# Patient Record
Sex: Female | Born: 1955 | Race: White | Hispanic: No | Smoking: Never smoker
Health system: Southern US, Community
[De-identification: ages and names within clinical notes are randomized; demographics above are authoritative.]

## PROBLEM LIST (undated history)

## (undated) DIAGNOSIS — F419 Anxiety disorder, unspecified: Secondary | ICD-10-CM

## (undated) DIAGNOSIS — N9489 Other specified conditions associated with female genital organs and menstrual cycle: Secondary | ICD-10-CM

## (undated) DIAGNOSIS — K6389 Other specified diseases of intestine: Secondary | ICD-10-CM

## (undated) DIAGNOSIS — E119 Type 2 diabetes mellitus without complications: Secondary | ICD-10-CM

## (undated) DIAGNOSIS — F32A Depression, unspecified: Secondary | ICD-10-CM

## (undated) DIAGNOSIS — R011 Cardiac murmur, unspecified: Secondary | ICD-10-CM

## (undated) DIAGNOSIS — I10 Essential (primary) hypertension: Secondary | ICD-10-CM

## (undated) DIAGNOSIS — N809 Endometriosis, unspecified: Secondary | ICD-10-CM

## (undated) DIAGNOSIS — R490 Dysphonia: Secondary | ICD-10-CM

## (undated) DIAGNOSIS — M549 Dorsalgia, unspecified: Secondary | ICD-10-CM

## (undated) DIAGNOSIS — F329 Major depressive disorder, single episode, unspecified: Secondary | ICD-10-CM

## (undated) DIAGNOSIS — R1903 Right lower quadrant abdominal swelling, mass and lump: Secondary | ICD-10-CM

## (undated) DIAGNOSIS — G8929 Other chronic pain: Secondary | ICD-10-CM

## (undated) DIAGNOSIS — R634 Abnormal weight loss: Secondary | ICD-10-CM

## (undated) DIAGNOSIS — K219 Gastro-esophageal reflux disease without esophagitis: Secondary | ICD-10-CM

## (undated) HISTORY — DX: Abnormal weight loss: R63.4

## (undated) HISTORY — PX: COLONOSCOPY: SHX5424

## (undated) HISTORY — DX: Other specified conditions associated with female genital organs and menstrual cycle: N94.89

## (undated) HISTORY — DX: Dysphonia: R49.0

## (undated) HISTORY — DX: Endometriosis, unspecified: N80.9

## (undated) HISTORY — DX: Right lower quadrant abdominal swelling, mass and lump: R19.03

## (undated) HISTORY — DX: Other specified diseases of intestine: K63.89

---
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
## 2006-08-14 HISTORY — PX: ABDOMINAL HYSTERECTOMY: SHX81

## 2007-02-05 ENCOUNTER — Other Ambulatory Visit: Payer: Self-pay

## 2007-02-05 ENCOUNTER — Ambulatory Visit: Payer: Self-pay | Admitting: Obstetrics and Gynecology

## 2007-02-11 ENCOUNTER — Ambulatory Visit: Payer: Self-pay | Admitting: Obstetrics and Gynecology

## 2008-09-01 ENCOUNTER — Ambulatory Visit: Payer: Self-pay | Admitting: Obstetrics and Gynecology

## 2009-07-05 ENCOUNTER — Ambulatory Visit: Payer: Self-pay | Admitting: Gastroenterology

## 2009-08-14 HISTORY — PX: ROTATOR CUFF REPAIR: SHX139

## 2010-04-06 ENCOUNTER — Ambulatory Visit: Payer: Self-pay | Admitting: Orthopedic Surgery

## 2010-04-08 ENCOUNTER — Ambulatory Visit: Payer: Self-pay | Admitting: Cardiovascular Disease

## 2010-04-14 ENCOUNTER — Ambulatory Visit: Payer: Self-pay | Admitting: Orthopedic Surgery

## 2011-07-07 ENCOUNTER — Emergency Department: Payer: Self-pay | Admitting: Emergency Medicine

## 2011-08-04 ENCOUNTER — Ambulatory Visit: Payer: Self-pay | Admitting: Orthopedic Surgery

## 2013-09-12 ENCOUNTER — Emergency Department: Payer: Self-pay | Admitting: Emergency Medicine

## 2014-11-26 DIAGNOSIS — E8940 Asymptomatic postprocedural ovarian failure: Secondary | ICD-10-CM | POA: Insufficient documentation

## 2015-02-09 DIAGNOSIS — E119 Type 2 diabetes mellitus without complications: Secondary | ICD-10-CM | POA: Insufficient documentation

## 2016-02-14 ENCOUNTER — Encounter: Payer: Self-pay | Admitting: Emergency Medicine

## 2016-02-14 ENCOUNTER — Emergency Department
Admission: EM | Admit: 2016-02-14 | Discharge: 2016-02-14 | Disposition: A | Discharge status: Home / Self Care | Payer: BC Managed Care – PPO | Attending: Emergency Medicine | Admitting: Emergency Medicine

## 2016-02-14 ENCOUNTER — Emergency Department: Payer: BC Managed Care – PPO

## 2016-02-14 DIAGNOSIS — I10 Essential (primary) hypertension: Secondary | ICD-10-CM | POA: Diagnosis not present

## 2016-02-14 DIAGNOSIS — R1030 Lower abdominal pain, unspecified: Secondary | ICD-10-CM

## 2016-02-14 DIAGNOSIS — N390 Urinary tract infection, site not specified: Secondary | ICD-10-CM

## 2016-02-14 DIAGNOSIS — E119 Type 2 diabetes mellitus without complications: Secondary | ICD-10-CM | POA: Diagnosis not present

## 2016-02-14 DIAGNOSIS — F329 Major depressive disorder, single episode, unspecified: Secondary | ICD-10-CM | POA: Diagnosis not present

## 2016-02-14 HISTORY — DX: Type 2 diabetes mellitus without complications: E11.9

## 2016-02-14 HISTORY — DX: Essential (primary) hypertension: I10

## 2016-02-14 HISTORY — DX: Depression, unspecified: F32.A

## 2016-02-14 HISTORY — DX: Anxiety disorder, unspecified: F41.9

## 2016-02-14 HISTORY — DX: Major depressive disorder, single episode, unspecified: F32.9

## 2016-02-14 LAB — COMPREHENSIVE METABOLIC PANEL
ALK PHOS: 59 U/L (ref 38–126)
ALT: 16 U/L (ref 14–54)
ANION GAP: 9 (ref 5–15)
AST: 15 U/L (ref 15–41)
Albumin: 4.2 g/dL (ref 3.5–5.0)
BILIRUBIN TOTAL: 0.2 mg/dL — AB (ref 0.3–1.2)
BUN: 20 mg/dL (ref 6–20)
CALCIUM: 9.5 mg/dL (ref 8.9–10.3)
CO2: 26 mmol/L (ref 22–32)
Chloride: 101 mmol/L (ref 101–111)
Creatinine, Ser: 0.76 mg/dL (ref 0.44–1.00)
Glucose, Bld: 77 mg/dL (ref 65–99)
POTASSIUM: 4 mmol/L (ref 3.5–5.1)
Sodium: 136 mmol/L (ref 135–145)
TOTAL PROTEIN: 7.3 g/dL (ref 6.5–8.1)

## 2016-02-14 LAB — URINALYSIS COMPLETE WITH MICROSCOPIC (ARMC ONLY)
BILIRUBIN URINE: NEGATIVE
Glucose, UA: NEGATIVE mg/dL
Hgb urine dipstick: NEGATIVE
KETONES UR: NEGATIVE mg/dL
LEUKOCYTES UA: NEGATIVE
NITRITE: NEGATIVE
PH: 5 (ref 5.0–8.0)
PROTEIN: NEGATIVE mg/dL
RBC / HPF: NONE SEEN RBC/hpf (ref 0–5)
SPECIFIC GRAVITY, URINE: 1.012 (ref 1.005–1.030)

## 2016-02-14 LAB — CBC
HEMATOCRIT: 38.1 % (ref 35.0–47.0)
HEMOGLOBIN: 13.2 g/dL (ref 12.0–16.0)
MCH: 32.3 pg (ref 26.0–34.0)
MCHC: 34.7 g/dL (ref 32.0–36.0)
MCV: 93.1 fL (ref 80.0–100.0)
Platelets: 200 10*3/uL (ref 150–440)
RBC: 4.09 MIL/uL (ref 3.80–5.20)
RDW: 12.5 % (ref 11.5–14.5)
WBC: 5.2 10*3/uL (ref 3.6–11.0)

## 2016-02-14 LAB — LIPASE, BLOOD: Lipase: 17 U/L (ref 11–51)

## 2016-02-14 MED ORDER — IOPAMIDOL (ISOVUE-300) INJECTION 61%
100.0000 mL | Freq: Once | INTRAVENOUS | Status: AC | PRN
  Administered 2016-02-14: 100 mL via INTRAVENOUS
  Filled 2016-02-14: qty 100

## 2016-02-14 MED ORDER — CIPROFLOXACIN HCL 500 MG PO TABS: 500.0000 mg | ORAL_TABLET | Freq: Two times a day (BID) | ORAL | Status: DC

## 2016-02-14 MED ORDER — CIPROFLOXACIN HCL 500 MG PO TABS
500.0000 mg | ORAL_TABLET | Freq: Once | ORAL | Status: AC
  Administered 2016-02-14: 500 mg via ORAL

## 2016-02-14 MED ORDER — DIATRIZOATE MEGLUMINE & SODIUM 66-10 % PO SOLN: 15.0000 mL | Freq: Once | ORAL | Status: DC

## 2016-02-14 MED ORDER — CIPROFLOXACIN HCL 500 MG PO TABS
ORAL_TABLET | ORAL | Status: AC
  Administered 2016-02-14: 500 mg via ORAL
  Filled 2016-02-14: qty 1

## 2016-02-14 NOTE — ED Notes (Signed)
Pt to ed from [facility] with c/o abd pain x several days.  Pt states pain was worse after eating until today and now it is constant.  Pt reports pain in lower central abd.  Denies n/v/d.

## 2016-02-14 NOTE — ED Provider Notes (Signed)
[facility] Emergency Department Provider Note  ____________________________________________    I have reviewed the triage vital signs and the nursing notes.   HISTORY  Chief Complaint Abdominal Pain    HPI Haley Jones is a 60 y.o. female who presents with complaints of abdominal pain for approximately 2-3 days. She reports the pain is primarily in the lower center of her abdomen and is burning in nature. She denies dysuria. No fevers or chills. No nausea. No diarrhea. She has had similar symptoms in the past with urinary tract infection.She has had a C-section in the past but no other surgeries     Past Medical History  Diagnosis Date  . Diabetes mellitus without complication ([place])   . Depression   . Anxiety     There are no active problems to display for this patient.   History reviewed. No pertinent past surgical history.  No current outpatient prescriptions on file.  Allergies Penicillins  No family history on file.  Social History Social History  Substance Use Topics  . Smoking status: Never Smoker   . Smokeless tobacco: None  . Alcohol Use: No    Review of Systems  Constitutional: Negative for fever.  ENT: Negative for sore throat Cardiovascular: Negative for chest pain Respiratory: Negative for Cough Gastrointestinal: As above Genitourinary: Negative for dysuria. No frequency, no hematuria Musculoskeletal: Negative for back pain. Skin: Negative for rash. Neurological: Negative for headache Psychiatric: no anxiety    ____________________________________________   PHYSICAL EXAM:  VITAL SIGNS: ED Triage Vitals  Enc Vitals Group     BP 02/14/16 1753 130/81 mmHg     Pulse Rate 02/14/16 1753 66     Resp 02/14/16 1753 18     Temp 02/14/16 1753 98.2 F (36.8 C)     Temp Source 02/14/16 1753 Oral     SpO2 02/14/16 1753 100 %     Weight 02/14/16 1753 178 lb (80.74 kg)     Height 02/14/16 1753 4\' 11"  (1.499 m)      Head Cir --      Peak Flow --      Pain Score 02/14/16 1739 10     Pain Loc --      Pain Edu? --      Excl. in [place]? --      Constitutional: Alert and oriented. Well appearing and in no distress. Pleasant and interactive Eyes: Conjunctivae are normal. No erythema or injection ENT   Head: Normocephalic and atraumatic.   Mouth/Throat: Mucous membranes are moist. Cardiovascular: Normal rate, regular rhythm. Normal and symmetric distal pulses are present in the upper extremities.  Respiratory: Normal respiratory effort without tachypnea nor retractions. Breath sounds are clear and equal bilaterally.  Gastrointestinal: Very mild suprapubic tenderness to palpation. No distention. There is no CVA tenderness. Genitourinary: deferred Musculoskeletal: Nontender with normal range of motion in all extremities. No lower extremity tenderness nor edema. Neurologic:  Normal speech and language. No gross focal neurologic deficits are appreciated. Skin:  Skin is warm, dry and intact. No rash noted. Psychiatric: Mood and affect are normal. Patient exhibits appropriate insight and judgment.  ____________________________________________    LABS (pertinent positives/negatives)  Labs Reviewed  CBC  LIPASE, BLOOD  COMPREHENSIVE METABOLIC PANEL  URINALYSIS COMPLETEWITH MICROSCOPIC ([facility] ONLY)    ____________________________________________   EKG  None ____________________________________________    RADIOLOGY  CT scan unremarkable  ____________________________________________   PROCEDURES  Procedure(s) performed: none  Critical Care performed: none  ____________________________________________   INITIAL IMPRESSION /  ASSESSMENT AND PLAN / ED COURSE  Pertinent labs & imaging results that were available during my care of the patient were reviewed by me and considered in my medical decision making (see chart for details).  Patient well-appearing and in no distress. She  has mild burning sensation suprapubically, we will check urine, labs and reevaluate  ----------------------------------------- 8:27 PM on 02/14/2016 -----------------------------------------  CT scan unremarkable. Labs benign. Urinalysis normal but patient's symptoms are similar to urinary tract infections in the past. Will send urine culture and start the patient on antibiotics.. Return precautions discussed and patient remains well appearing and in no distress. Vitals normal.  ____________________________________________   FINAL CLINICAL IMPRESSION(S) / ED DIAGNOSES  Final diagnoses:  UTI (lower urinary tract infection)  Lower abdominal pain          Lavonia Drafts, MD 02/14/16 2027

## 2016-02-14 NOTE — Discharge Instructions (Signed)

## 2016-02-16 LAB — URINE CULTURE

## 2017-03-29 ENCOUNTER — Other Ambulatory Visit: Payer: Self-pay | Admitting: Obstetrics and Gynecology

## 2017-03-29 DIAGNOSIS — Z1231 Encounter for screening mammogram for malignant neoplasm of breast: Secondary | ICD-10-CM

## 2017-04-17 ENCOUNTER — Ambulatory Visit
Admission: RE | Admit: 2017-04-17 | Discharge: 2017-04-17 | Disposition: A | Discharge status: Home / Self Care | Payer: BC Managed Care – PPO | Source: Ambulatory Visit | Attending: Obstetrics and Gynecology | Admitting: Obstetrics and Gynecology

## 2017-04-17 DIAGNOSIS — Z1231 Encounter for screening mammogram for malignant neoplasm of breast: Secondary | ICD-10-CM | POA: Diagnosis present

## 2017-07-19 ENCOUNTER — Other Ambulatory Visit: Payer: Self-pay | Admitting: Internal Medicine

## 2017-07-19 DIAGNOSIS — M5116 Intervertebral disc disorders with radiculopathy, lumbar region: Secondary | ICD-10-CM

## 2017-07-26 ENCOUNTER — Ambulatory Visit
Admission: RE | Admit: 2017-07-26 | Discharge: 2017-07-26 | Disposition: A | Discharge status: Home / Self Care | Payer: BC Managed Care – PPO | Source: Ambulatory Visit | Attending: Internal Medicine | Admitting: Internal Medicine

## 2017-07-26 DIAGNOSIS — M5116 Intervertebral disc disorders with radiculopathy, lumbar region: Secondary | ICD-10-CM | POA: Diagnosis present

## 2017-07-26 DIAGNOSIS — M5136 Other intervertebral disc degeneration, lumbar region: Secondary | ICD-10-CM | POA: Diagnosis not present

## 2017-07-26 DIAGNOSIS — M48061 Spinal stenosis, lumbar region without neurogenic claudication: Secondary | ICD-10-CM | POA: Diagnosis not present

## 2017-07-26 DIAGNOSIS — M519 Unspecified thoracic, thoracolumbar and lumbosacral intervertebral disc disorder: Secondary | ICD-10-CM | POA: Insufficient documentation

## 2017-08-15 ENCOUNTER — Emergency Department: Payer: BC Managed Care – PPO

## 2017-08-15 ENCOUNTER — Emergency Department
Admission: EM | Admit: 2017-08-15 | Discharge: 2017-08-15 | Disposition: A | Discharge status: Home / Self Care | Payer: BC Managed Care – PPO | Attending: Emergency Medicine | Admitting: Emergency Medicine

## 2017-08-15 ENCOUNTER — Encounter: Payer: Self-pay | Admitting: Emergency Medicine

## 2017-08-15 DIAGNOSIS — R1031 Right lower quadrant pain: Secondary | ICD-10-CM | POA: Diagnosis present

## 2017-08-15 DIAGNOSIS — K5900 Constipation, unspecified: Secondary | ICD-10-CM

## 2017-08-15 DIAGNOSIS — E119 Type 2 diabetes mellitus without complications: Secondary | ICD-10-CM | POA: Insufficient documentation

## 2017-08-15 DIAGNOSIS — I10 Essential (primary) hypertension: Secondary | ICD-10-CM | POA: Insufficient documentation

## 2017-08-15 LAB — COMPREHENSIVE METABOLIC PANEL
ALK PHOS: 59 U/L (ref 38–126)
ALT: 26 U/L (ref 14–54)
ANION GAP: 11 (ref 5–15)
AST: 22 U/L (ref 15–41)
Albumin: 3.8 g/dL (ref 3.5–5.0)
BILIRUBIN TOTAL: 0.6 mg/dL (ref 0.3–1.2)
BUN: 24 mg/dL — ABNORMAL HIGH (ref 6–20)
CALCIUM: 9.3 mg/dL (ref 8.9–10.3)
CO2: 24 mmol/L (ref 22–32)
CREATININE: 1.36 mg/dL — AB (ref 0.44–1.00)
Chloride: 101 mmol/L (ref 101–111)
GFR calc non Af Amer: 42 mL/min — ABNORMAL LOW (ref >60)
GFR, EST AFRICAN AMERICAN: 48 mL/min — AB (ref >60)
Glucose, Bld: 111 mg/dL — ABNORMAL HIGH (ref 65–99)
Potassium: 4.3 mmol/L (ref 3.5–5.1)
SODIUM: 136 mmol/L (ref 135–145)
TOTAL PROTEIN: 6.8 g/dL (ref 6.5–8.1)

## 2017-08-15 LAB — CBC
HCT: 36.6 % (ref 35.0–47.0)
HEMOGLOBIN: 12.5 g/dL (ref 12.0–16.0)
MCH: 32.1 pg (ref 26.0–34.0)
MCHC: 34.1 g/dL (ref 32.0–36.0)
MCV: 94 fL (ref 80.0–100.0)
PLATELETS: 225 10*3/uL (ref 150–440)
RBC: 3.89 MIL/uL (ref 3.80–5.20)
RDW: 13.4 % (ref 11.5–14.5)
WBC: 9.6 10*3/uL (ref 3.6–11.0)

## 2017-08-15 LAB — URINALYSIS, COMPLETE (UACMP) WITH MICROSCOPIC
Bilirubin Urine: NEGATIVE
GLUCOSE, UA: NEGATIVE mg/dL
Hgb urine dipstick: NEGATIVE
Ketones, ur: NEGATIVE mg/dL
Leukocytes, UA: NEGATIVE
Nitrite: NEGATIVE
PH: 5 (ref 5.0–8.0)
Protein, ur: 30 mg/dL — AB
SPECIFIC GRAVITY, URINE: 1.02 (ref 1.005–1.030)

## 2017-08-15 LAB — LIPASE, BLOOD: Lipase: 21 U/L (ref 11–51)

## 2017-08-15 MED ORDER — SODIUM CHLORIDE 0.9 % IV BOLUS (SEPSIS)
1000.0000 mL | Freq: Once | INTRAVENOUS | Status: AC
  Administered 2017-08-15: 1000 mL via INTRAVENOUS

## 2017-08-15 MED ORDER — IOPAMIDOL (ISOVUE-300) INJECTION 61%
75.0000 mL | Freq: Once | INTRAVENOUS | Status: AC | PRN
  Administered 2017-08-15: 75 mL via INTRAVENOUS
  Filled 2017-08-15: qty 75

## 2017-08-15 MED ORDER — ONDANSETRON HCL 4 MG/2ML IJ SOLN
4.0000 mg | Freq: Once | INTRAMUSCULAR | Status: AC
  Administered 2017-08-15: 4 mg via INTRAVENOUS
  Filled 2017-08-15: qty 2

## 2017-08-15 MED ORDER — IOPAMIDOL (ISOVUE-300) INJECTION 61%
30.0000 mL | Freq: Once | INTRAVENOUS | Status: AC | PRN
  Administered 2017-08-15: 30 mL via ORAL
  Filled 2017-08-15: qty 30

## 2017-08-15 MED ORDER — POLYETHYLENE GLYCOL 3350 17 GM/SCOOP PO POWD: 1.0000 | Freq: Once | ORAL | 0 refills | Status: AC

## 2017-08-15 MED ORDER — MORPHINE SULFATE (PF) 4 MG/ML IV SOLN
4.0000 mg | Freq: Once | INTRAVENOUS | Status: AC
  Administered 2017-08-15: 4 mg via INTRAVENOUS
  Filled 2017-08-15: qty 1

## 2017-08-15 MED ORDER — DOCUSATE SODIUM 100 MG PO CAPS: 100.0000 mg | ORAL_CAPSULE | Freq: Every day | ORAL | 2 refills | Status: AC | PRN

## 2017-08-15 NOTE — ED Provider Notes (Signed)
[facility] Emergency Department Provider Note  ____________________________________________   I have reviewed the triage vital signs and the nursing notes. Where available I have reviewed prior notes and, if possible and indicated, outside hospital notes.    HISTORY  Chief Complaint Abdominal Pain    HPI Haley Jones is a 62 y.o. female  With abdominal painjust finished abx for uti.  Has no hx of kidney stones. No hx of appy. Had total hyst in the past.     Location: rlq Radiation:  none Quality:  sharp crampy and intermittent Duration:  3 days Timing:  Gradual onset, nothing makes better or worse that she can think of.  Severity: at times significant Associated sxs:  No fever no diarrhea positive constipation PriorTreatment :  Tried nothing for pain.    Past Medical History:  Diagnosis Date  . Anxiety   . Depression   . Diabetes mellitus without complication ([place])   . Hypertension     There are no active problems to display for this patient.   Past Surgical History:  Procedure Laterality Date  . ABDOMINAL HYSTERECTOMY    . CESAREAN SECTION    . EYE SURGERY      Prior to Admission medications   Medication Sig Start Date End Date Taking? Authorizing Provider  ciprofloxacin (CIPRO) 500 MG tablet Take 1 tablet (500 mg total) by mouth 2 (two) times daily. 02/14/16   Lavonia Drafts, MD    Allergies Penicillins  No family history on file.  Social History Social History   Tobacco Use  . Smoking status: Never Smoker  . Smokeless tobacco: Never Used  Substance Use Topics  . Alcohol use: No  . Drug use: No    Review of Systems Constitutional: No fever/chills Eyes: No visual changes. ENT: No sore throat. No stiff neck no neck pain Cardiovascular: Denies chest pain. Respiratory: Denies shortness of breath. Gastrointestinal:   no vomiting.  No diarrhea.  + constipation. Genitourinary: Negative for dysuria. Musculoskeletal:  Negative lower extremity swelling Skin: Negative for rash. Neurological: Negative for severe headaches, focal weakness or numbness.   ____________________________________________   PHYSICAL EXAM:  VITAL SIGNS: ED Triage Vitals  Enc Vitals Group     BP 08/15/17 1821 (!) 142/85     Pulse Rate 08/15/17 1821 (!) 104     Resp 08/15/17 1821 17     Temp 08/15/17 1821 98 F (36.7 C)     Temp Source 08/15/17 1821 Oral     SpO2 08/15/17 1821 100 %     Weight 08/15/17 1820 148 lb (67.1 kg)     Height 08/15/17 1820 4\' 10"  (1.473 m)     Head Circumference --      Peak Flow --      Pain Score 08/15/17 1820 10     Pain Loc --      Pain Edu? --      Excl. in [place]? --     Constitutional: Alert and oriented. Well appearing and in no acute distress. Eyes: Conjunctivae are normal Head: Atraumatic HEENT: No congestion/rhinnorhea. Mucous membranes are moist.  Oropharynx non-erythematous Neck:   Nontender with no meningismus, no masses, no stridor Cardiovascular: Normal rate, regular rhythm. Grossly normal heart sounds.  Good peripheral circulation. Respiratory: Normal respiratory effort.  No retractions. Lungs CTAB. Abdominal: Soft and right-sided mid lower abdominal tenderness.  No pain over the gallbladder.  Mostly in the lower abdomen.  No guarding no rebound.  Soft abdomen. Back:  There  is no focal tenderness or step off.  there is no midline tenderness there are no lesions noted. there is no CVA tenderness Musculoskeletal: No lower extremity tenderness, no upper extremity tenderness. No joint effusions, no DVT signs strong distal pulses no edema Neurologic:  Normal speech and language. No gross focal neurologic deficits are appreciated.  Skin:  Skin is warm, dry and intact. No rash noted. Psychiatric: Mood and affect are normal. Speech and behavior are normal.  ____________________________________________   LABS (all labs ordered are listed, but only abnormal results are  displayed)  Labs Reviewed  COMPREHENSIVE METABOLIC PANEL - Abnormal; Notable for the following components:      Result Value   Glucose, Bld 111 (*)    BUN 24 (*)    Creatinine, Ser 1.36 (*)    GFR calc non Af Amer 42 (*)    GFR calc Af Amer 48 (*)    All other components within normal limits  URINALYSIS, COMPLETE (UACMP) WITH MICROSCOPIC - Abnormal; Notable for the following components:   Color, Urine YELLOW (*)    APPearance CLOUDY (*)    Protein, ur 30 (*)    Bacteria, UA RARE (*)    Squamous Epithelial / LPF 6-30 (*)    All other components within normal limits  LIPASE, BLOOD  CBC    Pertinent labs  results that were available during my care of the patient were reviewed by me and considered in my medical decision making (see chart for details). ____________________________________________  EKG  I personally interpreted any EKGs ordered by me or triage  ____________________________________________  RADIOLOGY  Pertinent labs & imaging results that were available during my care of the patient were reviewed by me and considered in my medical decision making (see chart for details). If possible, patient and/or family made aware of any abnormal findings.  No results found. ____________________________________________    PROCEDURES  Procedure(s) performed: None  Procedures  Critical Care performed: None  ____________________________________________   INITIAL IMPRESSION / ASSESSMENT AND PLAN / ED COURSE  Pertinent labs & imaging results that were available during my care of the patient were reviewed by me and considered in my medical decision making (see chart for details).  With 3 days of abdominal pain yet normal white count no fever, no vomiting, and reassuring exam nonetheless given her age I did do a CT scan there is no evidence of appendicitis she does have what appears to be a significant stool burden on the right which I think is causing her cramping  intermittent pain.  Patient made aware of all findings including lumbar disc disease but she is not complaining of any neurologic symptoms and I do not think this is a radiculopathy.  We will have patient closely follow-up with PCP for that.  She has she states already been made aware of it he had an MRI for the same in December of last year, 2 weeks ago considering the patient's symptoms, medical history, and physical examination today, I have low suspicion for cholecystitis or biliary pathology, pancreatitis, perforation or bowel obstruction, hernia, intra-abdominal abscess, AAA or dissection, volvulus or intussusception, mesenteric ischemia, ischemic gut, pyelonephritis or appendicitis.  Nothing to suggest intrathoracic pathology such as ACS or PE or dissection, we will treat her with a fairly aggressive bowel regimen at home to see if we can get her feeling better    ____________________________________________   FINAL CLINICAL IMPRESSION(S) / ED DIAGNOSES  Final diagnoses:  None      This  chart was dictated using voice recognition software.  Despite best efforts to proofread,  errors can occur which can change meaning.      Schuyler Amor, MD 08/15/17 2156

## 2017-08-15 NOTE — ED Notes (Signed)
Patient transported to CT 

## 2017-08-15 NOTE — ED Triage Notes (Signed)
Pt comes into the ED via POV c/o RLQ abdominal pain that started yesterday.  Denies any N/V/D.  Denies any chest pain, shortness of breath, or dizziness.  Patient in NAD with even and unlabored respirations.  Patient states the abdomen is tender on palpation.

## 2017-11-27 ENCOUNTER — Other Ambulatory Visit: Payer: Self-pay

## 2017-11-27 ENCOUNTER — Emergency Department
Admission: EM | Admit: 2017-11-27 | Discharge: 2017-11-28 | Disposition: A | Discharge status: Home / Self Care | Payer: BC Managed Care – PPO | Attending: Emergency Medicine | Admitting: Emergency Medicine

## 2017-11-27 ENCOUNTER — Encounter: Payer: Self-pay | Admitting: *Deleted

## 2017-11-27 DIAGNOSIS — E119 Type 2 diabetes mellitus without complications: Secondary | ICD-10-CM | POA: Insufficient documentation

## 2017-11-27 DIAGNOSIS — I10 Essential (primary) hypertension: Secondary | ICD-10-CM | POA: Insufficient documentation

## 2017-11-27 DIAGNOSIS — F419 Anxiety disorder, unspecified: Secondary | ICD-10-CM | POA: Diagnosis not present

## 2017-11-27 DIAGNOSIS — C8293 Follicular lymphoma, unspecified, intra-abdominal lymph nodes: Secondary | ICD-10-CM | POA: Diagnosis not present

## 2017-11-27 DIAGNOSIS — C8593 Non-Hodgkin lymphoma, unspecified, intra-abdominal lymph nodes: Secondary | ICD-10-CM

## 2017-11-27 DIAGNOSIS — F329 Major depressive disorder, single episode, unspecified: Secondary | ICD-10-CM | POA: Diagnosis not present

## 2017-11-27 DIAGNOSIS — R1031 Right lower quadrant pain: Secondary | ICD-10-CM | POA: Diagnosis present

## 2017-11-27 LAB — URINALYSIS, COMPLETE (UACMP) WITH MICROSCOPIC
BILIRUBIN URINE: NEGATIVE
Glucose, UA: NEGATIVE mg/dL
HGB URINE DIPSTICK: NEGATIVE
KETONES UR: NEGATIVE mg/dL
NITRITE: NEGATIVE
Protein, ur: NEGATIVE mg/dL
SPECIFIC GRAVITY, URINE: 1.019 (ref 1.005–1.030)
pH: 6 (ref 5.0–8.0)

## 2017-11-27 LAB — COMPREHENSIVE METABOLIC PANEL
ALT: 24 U/L (ref 14–54)
ANION GAP: 7 (ref 5–15)
AST: 22 U/L (ref 15–41)
Albumin: 3.8 g/dL (ref 3.5–5.0)
Alkaline Phosphatase: 67 U/L (ref 38–126)
BILIRUBIN TOTAL: 0.4 mg/dL (ref 0.3–1.2)
BUN: 26 mg/dL — AB (ref 6–20)
CHLORIDE: 98 mmol/L — AB (ref 101–111)
CO2: 30 mmol/L (ref 22–32)
Calcium: 9.7 mg/dL (ref 8.9–10.3)
Creatinine, Ser: 0.74 mg/dL (ref 0.44–1.00)
Glucose, Bld: 103 mg/dL — ABNORMAL HIGH (ref 65–99)
POTASSIUM: 4.8 mmol/L (ref 3.5–5.1)
Sodium: 135 mmol/L (ref 135–145)
TOTAL PROTEIN: 7.3 g/dL (ref 6.5–8.1)

## 2017-11-27 LAB — CBC
HCT: 35.6 % (ref 35.0–47.0)
HEMOGLOBIN: 12.1 g/dL (ref 12.0–16.0)
MCH: 33.3 pg (ref 26.0–34.0)
MCHC: 34.1 g/dL (ref 32.0–36.0)
MCV: 97.7 fL (ref 80.0–100.0)
Platelets: 305 10*3/uL (ref 150–440)
RBC: 3.64 MIL/uL — AB (ref 3.80–5.20)
RDW: 13.5 % (ref 11.5–14.5)
WBC: 15.4 10*3/uL — AB (ref 3.6–11.0)

## 2017-11-27 LAB — LIPASE, BLOOD: LIPASE: 27 U/L (ref 11–51)

## 2017-11-27 NOTE — ED Triage Notes (Signed)
Pt to ED reporting continued  RLQ abd pain since being seen a month ago. Pt  Was told she was constipated but pain has reportedly continued as a dull ache until today the pain became sharp again and pt became nauseous. No vomiting or diarrhea. Normal BM yesterday. No changes in urine. No fevers

## 2017-11-27 NOTE — ED Notes (Addendum)
Pt to STAT desk and c/o worsening abd pain and increased nausea. Refused pain or nausea medications and states she just wants [place] to know and wants to know how many people are ahead of her. Pt reassured.

## 2017-11-28 ENCOUNTER — Encounter: Payer: Self-pay | Admitting: Hematology and Oncology

## 2017-11-28 ENCOUNTER — Ambulatory Visit: Payer: BC Managed Care – PPO | Admitting: Hematology and Oncology

## 2017-11-28 ENCOUNTER — Inpatient Hospital Stay: Payer: BC Managed Care – PPO | Attending: Hematology and Oncology | Admitting: Hematology and Oncology

## 2017-11-28 ENCOUNTER — Inpatient Hospital Stay: Payer: BC Managed Care – PPO

## 2017-11-28 ENCOUNTER — Emergency Department: Payer: BC Managed Care – PPO

## 2017-11-28 VITALS — BP 145/85 | HR 88 | Temp 97.6°F | Resp 18 | Ht <= 58 in | Wt 147.3 lb

## 2017-11-28 DIAGNOSIS — K6389 Other specified diseases of intestine: Secondary | ICD-10-CM

## 2017-11-28 DIAGNOSIS — E119 Type 2 diabetes mellitus without complications: Secondary | ICD-10-CM | POA: Insufficient documentation

## 2017-11-28 DIAGNOSIS — R634 Abnormal weight loss: Secondary | ICD-10-CM | POA: Diagnosis not present

## 2017-11-28 DIAGNOSIS — I10 Essential (primary) hypertension: Secondary | ICD-10-CM | POA: Diagnosis not present

## 2017-11-28 DIAGNOSIS — R1903 Right lower quadrant abdominal swelling, mass and lump: Secondary | ICD-10-CM

## 2017-11-28 LAB — URIC ACID: URIC ACID, SERUM: 4.7 mg/dL (ref 2.3–6.6)

## 2017-11-28 LAB — LACTATE DEHYDROGENASE: LDH: 121 U/L (ref 98–192)

## 2017-11-28 MED ORDER — IOPAMIDOL (ISOVUE-300) INJECTION 61%
100.0000 mL | Freq: Once | INTRAVENOUS | Status: AC | PRN
  Administered 2017-11-28: 100 mL via INTRAVENOUS

## 2017-11-28 MED ORDER — FENTANYL CITRATE (PF) 100 MCG/2ML IJ SOLN
50.0000 ug | Freq: Once | INTRAMUSCULAR | Status: AC
Start: 2017-11-28 — End: 2017-11-28
  Administered 2017-11-28: 50 ug via INTRAVENOUS
  Filled 2017-11-28: qty 2

## 2017-11-28 MED ORDER — HALOPERIDOL LACTATE 5 MG/ML IJ SOLN
2.5000 mg | Freq: Once | INTRAMUSCULAR | Status: AC
  Administered 2017-11-28: 2.5 mg via INTRAVENOUS
  Filled 2017-11-28: qty 1

## 2017-11-28 NOTE — ED Provider Notes (Signed)
[facility] Emergency Department Provider Note  ____________________________________________   First MD Initiated Contact with Patient 11/28/17 0013     (approximate)  I have reviewed the triage vital signs and the nursing notes.   HISTORY  Chief Complaint Abdominal Pain   HPI Haley Jones is a 62 y.o. female who self presents to the emergency department with right lower quadrant pain.  She has had this pain intermittently for the past 3-4 months.  Her pain is moderate to severe in the right lower quadrant.  It does not happen every day but when it happens it is severe.  She cannot isolate anything in particular that seems to make the symptoms better or worse.  She has some nausea but no vomiting.  She was seen in our emergency department in January and she had a CT scan which was negative for acute intra-abdominal pathology and her pain was attributed to constipation.  She has had a bowel regimen at home which has not improved her symptoms.  No fevers or chills.  She has a remote history of hysterectomy and cesarean section.  She did have a recent urinary tract infection for which she completed antibiotics.  Past Medical History:  Diagnosis Date  . Anxiety   . Depression   . Diabetes mellitus without complication ([place])   . Hypertension     There are no active problems to display for this patient.   Past Surgical History:  Procedure Laterality Date  . ABDOMINAL HYSTERECTOMY    . CESAREAN SECTION    . EYE SURGERY      Prior to Admission medications   Medication Sig Start Date End Date Taking? Authorizing Provider  ciprofloxacin (CIPRO) 500 MG tablet Take 1 tablet (500 mg total) by mouth 2 (two) times daily. 02/14/16   Lavonia Drafts, MD  docusate sodium (COLACE) 100 MG capsule Take 1 capsule (100 mg total) by mouth daily as needed. 08/15/17 08/15/18  Schuyler Amor, MD    Allergies Penicillins  History reviewed. No pertinent family  history.  Social History Social History   Tobacco Use  . Smoking status: Never Smoker  . Smokeless tobacco: Never Used  Substance Use Topics  . Alcohol use: No  . Drug use: No    Review of Systems Constitutional: No fever/chills Eyes: No visual changes. ENT: No sore throat. Cardiovascular: Denies chest pain. Respiratory: Denies shortness of breath. Gastrointestinal: Positive for abdominal pain.  Positive for nausea, no vomiting.  No diarrhea.  No constipation. Genitourinary: Negative for dysuria. Musculoskeletal: Negative for back pain. Skin: Negative for rash. Neurological: Negative for headaches, focal weakness or numbness.   ____________________________________________   PHYSICAL EXAM:  VITAL SIGNS: ED Triage Vitals [11/27/17 1724]  Enc Vitals Group     BP (!) 122/95     Pulse Rate 94     Resp 16     Temp 98.7 F (37.1 C)     Temp Source Oral     SpO2 100 %     Weight 140 lb (63.5 kg)     Height 4\' 10"  (1.473 m)     Head Circumference      Peak Flow      Pain Score 8     Pain Loc      Pain Edu?      Excl. in [place]?     Constitutional: Alert and oriented x4 appears uncomfortable nontoxic no diaphoresis speaks full clear sentences Eyes: PERRL EOMI. Head: Atraumatic. Nose: No congestion/rhinnorhea.  Mouth/Throat: No trismus Neck: No stridor.   Cardiovascular: Normal rate, regular rhythm. Grossly normal heart sounds.  Good peripheral circulation. Respiratory: Normal respiratory effort.  No retractions. Lungs CTAB and moving good air Gastrointestinal: Soft abdomen quite tender right lower quadrant as well as left lower quadrant with no rebound or guarding.  No frank peritonitis.  No hernia appreciated Musculoskeletal: No lower extremity edema   Neurologic:  Normal speech and language. No gross focal neurologic deficits are appreciated. Skin:  Skin is warm, dry and intact. No rash noted. Psychiatric: Mood and affect are normal. Speech and behavior are  normal.    ____________________________________________   DIFFERENTIAL includes but not limited to  Appendicitis, diverticulitis, bowel obstruction, femoral hernia, urinary tract infection, nephrolithiasis ____________________________________________   LABS (all labs ordered are listed, but only abnormal results are displayed)  Labs Reviewed  COMPREHENSIVE METABOLIC PANEL - Abnormal; Notable for the following components:      Result Value   Chloride 98 (*)    Glucose, Bld 103 (*)    BUN 26 (*)    All other components within normal limits  CBC - Abnormal; Notable for the following components:   WBC 15.4 (*)    RBC 3.64 (*)    All other components within normal limits  URINALYSIS, COMPLETE (UACMP) WITH MICROSCOPIC - Abnormal; Notable for the following components:   Color, Urine YELLOW (*)    APPearance CLEAR (*)    Leukocytes, UA TRACE (*)    Bacteria, UA RARE (*)    Squamous Epithelial / LPF 0-5 (*)    All other components within normal limits  LIPASE, BLOOD    Lab work reviewed by me shows elevated white count which is nonspecific.  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  The patient arrives uncomfortable appearing with acutely worsening abdominal pain.  Differential is broad and given the uncertainty I do believe she requires a CT scan  with IV contrast.  IV fentanyl and haloperidol given for the patient's pain and nausea now.     ----------------------------------------- 2:24 AM on 11/28/2017 -----------------------------------------  Unfortunately the patient CT scan is concerning for small bowel lymphoma.  I had a lengthy discussion with the patient and family regarding the possible diagnosis and the importance of following up with oncology this coming week. 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     Darel Hong, MD 11/28/17 [phone]

## 2017-11-28 NOTE — Discharge Instructions (Signed)
Please take your pain medication as needed for severe symptoms and follow-up with the oncologist this coming week for reevaluation.  Return to the emergency department sooner for any concerns whatsoever.  It was a pleasure to take care of you today, and thank you for coming to our emergency department.  If you have any questions or concerns before leaving please ask the nurse to grab me and I'm more than happy to go through your aftercare instructions again.  If you were prescribed any opioid pain medication today such as Norco, Vicodin, Percocet, morphine, hydrocodone, or oxycodone please make sure you do not drive when you are taking this medication as it can alter your ability to drive safely.  If you have any concerns once you are home that you are not improving or are in fact getting worse before you can make it to your follow-up appointment, please do not hesitate to call 911 and come back for further evaluation.  Darel Hong, MD  Results for orders placed or performed during the hospital encounter of 11/27/17  Lipase, blood  Result Value Ref Range   Lipase 27 11 - 51 U/L  Comprehensive metabolic panel  Result Value Ref Range   Sodium 135 135 - 145 mmol/L   Potassium 4.8 3.5 - 5.1 mmol/L   Chloride 98 (L) 101 - 111 mmol/L   CO2 30 22 - 32 mmol/L   Glucose, Bld 103 (H) 65 - 99 mg/dL   BUN 26 (H) 6 - 20 mg/dL   Creatinine, Ser 0.74 0.44 - 1.00 mg/dL   Calcium 9.7 8.9 - 10.3 mg/dL   Total Protein 7.3 6.5 - 8.1 g/dL   Albumin 3.8 3.5 - 5.0 g/dL   AST 22 15 - 41 U/L   ALT 24 14 - 54 U/L   Alkaline Phosphatase 67 38 - 126 U/L   Total Bilirubin 0.4 0.3 - 1.2 mg/dL   GFR calc non Af Amer >60 >60 mL/min   GFR calc Af Amer >60 >60 mL/min   Anion gap 7 5 - 15  CBC  Result Value Ref Range   WBC 15.4 (H) 3.6 - 11.0 K/uL   RBC 3.64 (L) 3.80 - 5.20 MIL/uL   Hemoglobin 12.1 12.0 - 16.0 g/dL   HCT 35.6 35.0 - 47.0 %   MCV 97.7 80.0 - 100.0 fL   MCH 33.3 26.0 - 34.0 pg   MCHC 34.1  32.0 - 36.0 g/dL   RDW 13.5 11.5 - 14.5 %   Platelets 305 150 - 440 K/uL  Urinalysis, Complete w Microscopic  Result Value Ref Range   Color, Urine YELLOW (A) YELLOW   APPearance CLEAR (A) CLEAR   Specific Gravity, Urine 1.019 1.005 - 1.030   pH 6.0 5.0 - 8.0   Glucose, UA NEGATIVE NEGATIVE mg/dL   Hgb urine dipstick NEGATIVE NEGATIVE   Bilirubin Urine NEGATIVE NEGATIVE   Ketones, ur NEGATIVE NEGATIVE mg/dL   Protein, ur NEGATIVE NEGATIVE mg/dL   Nitrite NEGATIVE NEGATIVE   Leukocytes, UA TRACE (A) NEGATIVE   RBC / HPF 0-5 0 - 5 RBC/hpf   WBC, UA 6-30 0 - 5 WBC/hpf   Bacteria, UA RARE (A) NONE SEEN   Squamous Epithelial / LPF 0-5 (A) NONE SEEN   Mucus PRESENT    Ct Abdomen Pelvis W Contrast  Result Date: 11/28/2017 CLINICAL DATA:  Chronic right lower quadrant abdominal pain and constipation. Nausea. EXAM: CT ABDOMEN AND PELVIS WITH CONTRAST TECHNIQUE: Multidetector CT imaging of the abdomen and  pelvis was performed using the standard protocol following bolus administration of intravenous contrast. CONTRAST:  141mL ISOVUE-300 IOPAMIDOL (ISOVUE-300) INJECTION 61% COMPARISON:  CT of the abdomen and pelvis from 08/15/2017 FINDINGS: Lower chest: The visualized lung bases are grossly clear. The visualized portions of the mediastinum are unremarkable. Hepatobiliary: The liver is unremarkable in appearance. The gallbladder is unremarkable in appearance. The common bile duct remains normal in caliber. Pancreas: The pancreas is within normal limits. Spleen: The spleen is unremarkable in appearance. Adrenals/Urinary Tract: The adrenal glands are unremarkable in appearance. The kidneys are within normal limits. There is no evidence of hydronephrosis. No renal or ureteral stones are identified. No perinephric stranding is seen. Stomach/Bowel: The stomach is unremarkable in appearance. The small bowel is within normal limits. The appendix is normal in caliber, without evidence of appendicitis. The colon is  largely filled with stool, raising concern for mild constipation. Vascular/Lymphatic: The abdominal aorta is unremarkable in appearance. The inferior vena cava is grossly unremarkable. No retroperitoneal lymphadenopathy is seen. No pelvic sidewall lymphadenopathy is identified. Reproductive: The bladder is mildly distended and grossly unremarkable. An unusual peripherally enhancing 3.7 cm cystic lesion is noted at the upper right hemipelvis, with a thick wall; this has increased in size since January, with minimal surrounding soft tissue inflammation extending up the mesentery. This may reflect a small bowel loop, in which case it raises concern for lymphoma. A right adnexal cystic lesion is considered less likely, given mesenteric inflammation. Other: No additional soft tissue abnormalities are seen. Musculoskeletal: No acute osseous abnormalities are identified. Vacuum phenomenon is noted at the lower lumbar spine. The visualized musculature is unremarkable in appearance. IMPRESSION: 1. Unusual thick-walled peripherally enhancing 3.7 cm cystic lesion at the right upper hemipelvis. This has increased in size since January, with minimal surrounding soft tissue inflammation. This may reflect a small bowel loop, in which case it raises concern for small bowel lymphoma. A right adnexal cystic lesion is considered less likely. Repeat CT with oral contrast could be considered to assess whether this is a small bowel process. Alternatively, PET/CT would be helpful to assess for malignancy. 2. Colon largely filled with stool, raising concern for mild constipation. These results were called by telephone at the time of interpretation on 11/28/2017 at 1:44 am to Dr. Darel Hong, who verbally acknowledged these results. Electronically Signed   By: Garald Balding M.D.   On: 11/28/2017 01:46

## 2017-11-28 NOTE — Progress Notes (Signed)
Patient here today as new evaluation regarding lymphoma.  Patient seen in ED last night.  Patient complains of right sided abdominal pain that radiates up into mid chest.  Patient is accompanied by her significant other, Hank today.

## 2017-11-28 NOTE — Progress Notes (Deleted)
[facility] day:  11/28/2017  Chief Complaint: Haley Jones is a 62 y.o. female with possible lymphoma who is referred in consultation by Dr. Mable Paris for assessment and management.  HPI:  The patient notes a history of intermittent RIGHT lower quadrant  abdominal pain x 3-4 months.  She was seen in the [facility] ER on 08/15/2017 with abdominal pain.  She had just completed antibiotics for a UTI.  Abdomen and pelvic revealed no acute intra-abdominal or intrapelvic abnormalities.  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  She noted intermittent pain x 3-4 months.  CBC revealed a hematocrit of 35.6, hemoglobin 12.1, MCV 97.7, platelets 305,000, and WBC 15,400. CMP was normal. Urinalysis revealed trace leukocytes and 6-30 WBCs.  Abdominal and pelvic CT on 11/28/2017 revealed an unusual thick-walled peripherally enhancing 3.7 cm cystic lesion at the right upper hemipelvis. This had increased in size since 08/2017, with minimal surrounding soft tissue inflammation. This may reflect a small bowel loop, in which case it raises concern for small bowel lymphoma. A right adnexal cystic lesion was considered less likely. Repeat CT with oral contrast could be considered to assess whether this is a small bowel process. Alternatively, PET/CT would be helpful to assess for malignancy.  Colon was largely filled with stool, raising concern for mild constipation.  Last EGD and colonoscopy was on 07/05/2009. Results unavailable for review. Patient notes that she was told that she had Barrett's esophagus.   Symptomatically, patient complains of RIGHT lower quadrant abdominal pain. She notes that  the pain will intermittent extend upwards centrally into her epigastric area causing considerable nausea. She denies vomiting. Patient is eating well. Patient has lost in excess of 30 pounds over the last 6 months. She notes that the weight loss is intentional.   Patient has considerable dysphonia that she has had for her entire life. Patient has been seen in consult by ENT "over 20 years ago".   Past Medical History:  Diagnosis Date  . Anxiety   . Depression   . Diabetes mellitus without complication ([place])   . Hypertension     Past Surgical History:  Procedure Laterality Date  . ABDOMINAL HYSTERECTOMY  2008  . CESAREAN SECTION    . EYE SURGERY    . ROTATOR CUFF REPAIR Left 2011    Family History  Problem Relation Age of Onset  . Cancer Mother   . Cancer Father   . Cancer Maternal Grandmother     Social History:  reports that she has never smoked. She has never used smokeless tobacco. She reports that she does not drink alcohol or use drugs.  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 Patient denies known exposures to radiation on toxins. The patient is accompanied by her boyfriend, Hank, today.  Allergies:  Allergies  Allergen Reactions  . Etodolac Other (See Comments)    Made her feel "loopy"  . Hydrocodone-Acetaminophen Nausea Only and Nausea And Vomiting  . Orphenadrine Citrate Other (See Comments)    Made her feel "loopy"  . Penicillins Rash    Current Medications: Current Outpatient Medications  Medication Sig Dispense Refill  . chlorzoxazone (PARAFON) 500 MG tablet Take 500 mg by  mouth 4 (four) times daily. PRN  0  . clonazePAM (KLONOPIN) 1 MG tablet Take 1 mg by mouth at bedtime.    . diclofenac (VOLTAREN) 75 MG EC tablet Take 75 mg by mouth daily.    . diphenhydrAMINE-APAP, sleep, (TYLENOL PM EXTRA STRENGTH PO) Take 1 tablet by mouth at bedtime.    . docusate sodium (COLACE) 100 MG capsule Take 1 capsule (100 mg total) by mouth daily as needed. 30  capsule 2  . estradiol (ESTRACE) 2 MG tablet Take 2 mg by mouth daily.  11  . glimepiride (AMARYL) 1 MG tablet Take 1 mg by mouth daily.    Marland Kitchen lisinopril (PRINIVIL,ZESTRIL) 10 MG tablet Take 10 mg by mouth daily.    . metFORMIN (GLUCOPHAGE) 500 MG tablet Take 500 mg by mouth 2 (two) times daily.    . phentermine (ADIPEX-P) 37.5 MG tablet Take 37.5 mg by mouth daily.  0  . sertraline (ZOLOFT) 50 MG tablet Take 50 mg by mouth daily.    Marland Kitchen triamcinolone cream (KENALOG) 0.1 % Apply 2 application topically daily.     No current facility-administered medications for this visit.     Review of Systems:  GENERAL:  Feels good.  Active.  No fevers, sweats or weight loss. PERFORMANCE STATUS (ECOG):  *** HEENT:  Rhinorrhea. Dysphonia. History of strabismus surgery (LEFT)as a child. No visual changes, sore throat, mouth sores or tenderness. Lungs: No shortness of breath or cough.  No hemoptysis. Cardiac:  No chest pain, palpitations, orthopnea, or PND. GI:  Reflux. Intermittent nausea. No vomiting, diarrhea, constipation, melena or hematochezia. GU:  No urgency, frequency, dysuria, or hematuria. Musculoskeletal:  Disc issues. LEFT shoulder pain. No back pain. No muscle tenderness. Extremities:  No pain or swelling. Skin:  No rashes or skin changes. Neuro:  No headache, numbness or weakness, balance or coordination issues. Endocrine:  No diabetes, thyroid issues, hot flashes or night sweats. Psych:  No mood changes, depression or anxiety. Pain:  No focal pain. Review of systems:  All other systems reviewed and found to be negative.  Physical Exam: There were no vitals taken for this visit. GENERAL:  Well developed, well nourished, **man sitting comfortably in the exam room in no acute distress. MENTAL STATUS:  Alert and oriented to person, place and time. HEAD:  *** hair.  Normocephalic, atraumatic, face symmetric, no Cushingoid features. EYES:  *** eyes.  Pupils equal round and reactive to light  and accomodation.  No conjunctivitis or scleral icterus. ENT:  Oropharynx clear without lesion.  Tongue normal. Mucous membranes moist.  RESPIRATORY:  Clear to auscultation without rales, wheezes or rhonchi. CARDIOVASCULAR:  Regular rate and rhythm without murmur, rub or gallop. ABDOMEN:  Soft, non-tender, with active bowel sounds, and no hepatosplenomegaly.  No masses. SKIN:  No rashes, ulcers or lesions. EXTREMITIES: No edema, no skin discoloration or tenderness.  No palpable cords. LYMPH NODES: No palpable cervical, supraclavicular, axillary or inguinal adenopathy  NEUROLOGICAL: Unremarkable. PSYCH:  Appropriate.   Admission on 11/27/2017, Discharged on 11/28/2017  Component Date Value Ref Range Status  . Lipase 11/27/2017 27  11 - 51 U/L Final   Performed at [facility], [address] [place], [place] [address]  . Sodium 11/27/2017 135  135 - 145 mmol/L Final  . Potassium 11/27/2017 4.8  3.5 - 5.1 mmol/L Final  . Chloride 11/27/2017 98* 101 - 111 mmol/L Final  . CO2 11/27/2017 30  22 - 32 mmol/L Final  . Glucose, Bld 11/27/2017 103*  65 - 99 mg/dL Final  . BUN 11/27/2017 26* 6 - 20 mg/dL Final  . Creatinine, Ser 11/27/2017 0.74  0.44 - 1.00 mg/dL Final  . Calcium 11/27/2017 9.7  8.9 - 10.3 mg/dL Final  . Total Protein 11/27/2017 7.3  6.5 - 8.1 g/dL Final  . Albumin 11/27/2017 3.8  3.5 - 5.0 g/dL Final  . AST 11/27/2017 22  15 - 41 U/L Final  . ALT 11/27/2017 24  14 - 54 U/L Final  . Alkaline Phosphatase 11/27/2017 67  38 - 126 U/L Final  . Total Bilirubin 11/27/2017 0.4  0.3 - 1.2 mg/dL Final  . GFR calc non Af Amer 11/27/2017 >60  >60 mL/min Final  . GFR calc Af Amer 11/27/2017 >60  >60 mL/min Final   Comment: (NOTE) The eGFR has been calculated using the CKD EPI equation. This calculation has not been validated in all clinical situations. eGFR's persistently <60 mL/min signify possible Chronic Kidney Disease.   Georgiann Hahn gap 11/27/2017 7  5 - 15 Final    Performed at [facility], [address] [place], [place] [address]  . WBC 11/27/2017 15.4* 3.6 - 11.0 K/uL Final  . RBC 11/27/2017 3.64* 3.80 - 5.20 MIL/uL Final  . Hemoglobin 11/27/2017 12.1  12.0 - 16.0 g/dL Final  . HCT 11/27/2017 35.6  35.0 - 47.0 % Final  . MCV 11/27/2017 97.7  80.0 - 100.0 fL Final  . MCH 11/27/2017 33.3  26.0 - 34.0 pg Final  . MCHC 11/27/2017 34.1  32.0 - 36.0 g/dL Final  . RDW 11/27/2017 13.5  11.5 - 14.5 % Final  . Platelets 11/27/2017 305  150 - 440 K/uL Final   Performed at [facility], [address], [place], [place] [address]  . Color, Urine 11/27/2017 YELLOW* YELLOW Final  . APPearance 11/27/2017 CLEAR* CLEAR Final  . Specific Gravity, Urine 11/27/2017 1.019  1.005 - 1.030 Final  . pH 11/27/2017 6.0  5.0 - 8.0 Final  . Glucose, UA 11/27/2017 NEGATIVE  NEGATIVE mg/dL Final  . Hgb urine dipstick 11/27/2017 NEGATIVE  NEGATIVE Final  . Bilirubin Urine 11/27/2017 NEGATIVE  NEGATIVE Final  . Ketones, ur 11/27/2017 NEGATIVE  NEGATIVE mg/dL Final  . Protein, ur 11/27/2017 NEGATIVE  NEGATIVE mg/dL Final  . Nitrite 11/27/2017 NEGATIVE  NEGATIVE Final  . Leukocytes, UA 11/27/2017 TRACE* NEGATIVE Final  . RBC / HPF 11/27/2017 0-5  0 - 5 RBC/hpf Final  . WBC, UA 11/27/2017 6-30  0 - 5 WBC/hpf Final  . Bacteria, UA 11/27/2017 RARE* NONE SEEN Final  . Squamous Epithelial / LPF 11/27/2017 0-5* NONE SEEN Final  . Mucus 11/27/2017 PRESENT   Final   Performed at [facility], [address] [place],  [address]    Assessment:  Haley Jones is a 62 y.o. female ***  Plan: 1.  Labs today: LDH, uric acid, CA-125, CEA 2.  Discuss questionable small bowel finding on recent CT imaging. 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 Differential also include primary peritoneal carcinomatosis.  3.  Discuss patient at tumor board on 11/29/2017. 4.  Schedule PET scan. 5.  Review imaging with GI. Patient also has a history of Barrett's  esophagus and needs repeat EGD.  6.  Discuss dysphonia. Patient notes that she has had noted change in voice quality all of her life. Discussed need for repeat ENT evaluation. Patient declined.  7.  RTC after PET scan for MD assessment to review results.   Honor Loh, NP  11/28/2017, 5:14 PM   I saw and evaluated the patient, participating in the key portions of the service and reviewing pertinent diagnostic studies and records.  I reviewed the nurse practitioner's note and agree with the findings and the plan.  The assessment and plan were discussed with the patient.  Additional diagnostic studies of *** are needed to clarify *** and would change the clinical management.  A few ***multiple questions were asked by the patient and answered.   Nolon Stalls, MD 11/28/2017,5:14 PM

## 2017-11-28 NOTE — Progress Notes (Signed)
[facility] day:  11/28/2017  Chief Complaint: Haley Jones is a 62 y.o. female with possible lymphoma who is referred in consultation by Dr. Darel Hong for assessment and management.  HPI:  The patient notes a history of intermittent abdominal pain x 3-4 months.  She was seen in the [facility] ER on 08/15/2017 with abdominal pain.  She had just completed antibiotics for a UTI.  Abdomen and pelvic revealed no acute intra-abdominal or intrapelvic abnormalities.  There was degenerative disc and facet disease changes of the lower lumbar spine with neural foraminal stenoses at L4-L5 and L5-S1 with question of a calcified disc fragment posterior to the L5 vertebral body on RIGHT.   She presented to the [facility] ER on 11/28/2017 with moderate to severe right lower quadrant pain.  She noted intermittent pain x 3-4 months.  CBC revealed a hematocrit of 35.6, hemoglobin 12.1, MCV 97.7, platelets 305,000, and WBC 15,400.  CMP was normal. Urinalysis revealed trace leukocytes and 6-30 WBCs.  Abdominal and pelvic CT on 11/28/2017 revealed an unusual thick-walled peripherally enhancing 3.7 cm cystic lesion at the right upper hemipelvis. This had increased in size since 08/2017, with minimal surrounding soft tissue inflammation. This may reflect a small bowel loop, in which case it raises concern for small bowel lymphoma.  A right adnexal cystic lesion was considered less likely.  Repeat CT with oral contrast could be considered to assess whether this was a small bowel process. Alternatively, PET/CT would be helpful to assess for malignancy.  Colon was largely filled with stool, raising concern for mild constipation.  She describes an EGD and colonoscopy in her late 65s for reflux.  She denied any bleeding.  She is unsure about a diagnosis of Barrett's esophagus.  She is no longer taking Nexium.  She previously was seen in the GI [facility].  She states that she had  a TAH/BSO in 2008 for fibroids and menorrhagia.  Symptomatically, she notes intermittent abdominal pain "like a catch".  She states that yesterday, she was doubled up with pain.  Pain can "shoot up and cause nausea".  She states that her bowels "never move good" and she always has to take something.  She typically has a bowel movements 3-4 x/week.  She has lost 30- 35 pounds in the past 6 months.  She describes "watching what she eats" and "being determined" to lose weight.  She denies any fevers or sweats.  She notes a hoarse voice for which she saw ENT > 20 years ago.   Past Medical History:  Diagnosis Date  . Anxiety   . Depression   . Diabetes mellitus without complication ([place])   . Hypertension     Past Surgical History:  Procedure Laterality Date  . ABDOMINAL HYSTERECTOMY  2008  . CESAREAN SECTION    . EYE SURGERY    . ROTATOR CUFF REPAIR Left 2011    Family History  Problem Relation Age of Onset  . Melanoma Mother   . Lung cancer Father   . Ovarian cancer Maternal Grandmother     Social History:  reports that she has never smoked. She has never used smokeless tobacco. She reports that she does not drink alcohol or use drugs.  She denies any exposure to radiation or toxins.  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  She lives in [place].  The patient is accompanied by her boyfriend, Hank, today.  Allergies:  Allergies  Allergen Reactions  .  Etodolac Other (See Comments)    Made her feel "loopy"  . Hydrocodone-Acetaminophen Nausea Only and Nausea And Vomiting  . Orphenadrine Citrate Other (See Comments)    Made her feel "loopy"  . Penicillins Rash    Current Medications: Current Outpatient Medications  Medication Sig Dispense Refill  . chlorzoxazone (PARAFON) 500 MG tablet Take 500 mg by mouth 4 (four) times daily. PRN  0  . clonazePAM (KLONOPIN) 1 MG tablet Take 1 mg by mouth at bedtime.    . diclofenac (VOLTAREN) 75 MG EC tablet Take 75 mg by mouth daily.    .  diphenhydrAMINE-APAP, sleep, (TYLENOL PM EXTRA STRENGTH PO) Take 1 tablet by mouth at bedtime.    . docusate sodium (COLACE) 100 MG capsule Take 1 capsule (100 mg total) by mouth daily as needed. 30 capsule 2  . estradiol (ESTRACE) 2 MG tablet Take 2 mg by mouth daily.  11  . glimepiride (AMARYL) 1 MG tablet Take 1 mg by mouth daily.    Marland Kitchen lisinopril (PRINIVIL,ZESTRIL) 10 MG tablet Take 10 mg by mouth daily.    . metFORMIN (GLUCOPHAGE) 500 MG tablet Take 500 mg by mouth 2 (two) times daily.    . phentermine (ADIPEX-P) 37.5 MG tablet Take 37.5 mg by mouth daily.  0  . sertraline (ZOLOFT) 50 MG tablet Take 50 mg by mouth daily.    Marland Kitchen triamcinolone cream (KENALOG) 0.1 % Apply 2 application topically daily.     No current facility-administered medications for this visit.     Review of Systems:  GENERAL:  Feels "ok".  No fevers or sweats.  Weight loss of 30-35 pounds in past 6 months (intentional). PERFORMANCE STATUS (ECOG):  1 HEENT:  Hoarse x 20 years.  Runny nose secondary to seasonal allergies.  No visual changes, sore throat, mouth sores or tenderness. Lungs: No shortness of breath or cough.  No hemoptysis. Cardiac:  No chest pain, palpitations, orthopnea, or PND. GI:  Right lower quadrant abdominal pain (see HPI).  Constipation.  Nausea sometimes associated with pain.  No vomiting, diarrhea, melena or hematochezia. GU:  No urgency, frequency, dysuria, or hematuria.  h/o UTIs. Musculoskeletal:  Scoliosis.  Herniated disk.  Torn shoulder 2011.  No muscle tenderness. Extremities:  No pain or swelling. Skin:  No rashes or skin changes. Neuro:  Balance off since shoulder injury.  No headache, numbness or weakness, or coordination issues. Endocrine:  No diabetes, thyroid issues, hot flashes or night sweats. Psych:  No mood changes, depression or anxiety. Pain:  Abdominal pain x 3 months (waxes and wanes). Review of systems:  All other systems reviewed and found to be negative.  Physical  Exam: Blood pressure (!) 145/85, pulse 88, temperature 97.6 F (36.4 C), temperature source Tympanic, resp. rate 18, height '4\' 10"'  (1.473 m), weight 147 lb 4.3 oz (66.8 kg). GENERAL:  Well developed, well nourished, woman sitting comfortably in the exam room in no acute distress. MENTAL STATUS:  Alert and oriented to person, place and time. HEAD:  Normocephalic, atraumatic, face symmetric, no Cushingoid features. EYES:  Glasses.  Blue eyes.  Slight dysconjugate gaze (left).  Pupils equal round and reactive to light and accomodation.  No conjunctivitis or scleral icterus. ENT:  Hoarse.  Oropharynx clear without lesion.  Tongue normal. Mucous membranes moist.  RESPIRATORY:  Clear to auscultation without rales, wheezes or rhonchi. CARDIOVASCULAR:  Regular rate and rhythm without murmur, rub or gallop. ABDOMEN:  Soft, tender in RLQ without guarding or rebound tenderness.  ,Active  bowel sounds and no hepatosplenomegaly.  No masses. SKIN:  No rashes, ulcers or lesions. EXTREMITIES: No edema, no skin discoloration or tenderness.  No palpable cords. LYMPH NODES: No palpable cervical, supraclavicular, axillary or inguinal adenopathy  NEUROLOGICAL: Unremarkable. PSYCH:  Appropriate.   Appointment on 11/28/2017  Component Date Value Ref Range Status  . CEA 11/28/2017 1.1  0.0 - 4.7 ng/mL Final   Comment: (NOTE)                             Nonsmokers          <3.9                             Smokers             <5.6 Roche [employer] Electrochemiluminescence Immunoassay (ECLIA) Values obtained with different assay methods or kits cannot be used interchangeably.  Results cannot be interpreted as absolute evidence of the presence or absence of malignant disease. Performed At: [facility] [place], [place] [id number] Rush Farmer MD VZ:[id number] Performed at [facility], [address] [place], [place] [address]   . Cancer Antigen (CA) 125 11/28/2017  125.9* 0.0 - 38.1 U/mL Final   Comment: (NOTE) Roche [employer]ostics Electrochemiluminescence Immunoassay (ECLIA) Values obtained with different assay methods or kits cannot be used interchangeably.  Results cannot be interpreted as absolute evidence of the presence or absence of malignant disease. Performed At: [facility] [place], [place] [id number] Rush Farmer MD SJ:[id number] Performed at [facility], [address], [place], [place] [address]   . Uric Acid, Serum 11/28/2017 4.7  2.3 - 6.6 mg/dL Final   Performed at [facility], [address], [place], [place] [address]  . LDH 11/28/2017 121  98 - 192 U/L Final   Performed at [facility], [address] [place], [place] [address]  Admission on 11/27/2017, Discharged on 11/28/2017  Component Date Value Ref Range Status  . Lipase 11/27/2017 27  11 - 51 U/L Final   Performed at [facility], [address] [place], [place] [address]  . Sodium 11/27/2017 135  135 - 145 mmol/L Final  . Potassium 11/27/2017 4.8  3.5 - 5.1 mmol/L Final  . Chloride 11/27/2017 98* 101 - 111 mmol/L Final  . CO2 11/27/2017 30  22 - 32 mmol/L Final  . Glucose, Bld 11/27/2017 103* 65 - 99 mg/dL Final  . BUN 11/27/2017 26* 6 - 20 mg/dL Final  . Creatinine, Ser 11/27/2017 0.74  0.44 - 1.00 mg/dL Final  . Calcium 11/27/2017 9.7  8.9 - 10.3 mg/dL Final  . Total Protein 11/27/2017 7.3  6.5 - 8.1 g/dL Final  . Albumin 11/27/2017 3.8  3.5 - 5.0 g/dL Final  . AST 11/27/2017 22  15 - 41 U/L Final  . ALT 11/27/2017 24  14 - 54 U/L Final  . Alkaline Phosphatase 11/27/2017 67  38 - 126 U/L Final  . Total Bilirubin 11/27/2017 0.4  0.3 - 1.2 mg/dL Final  . GFR calc non Af Amer 11/27/2017 >60  >60 mL/min Final  . GFR calc Af Amer 11/27/2017 >60  >60 mL/min Final   Comment: (NOTE) The eGFR has been calculated using the CKD EPI equation. This calculation has not been validated in all clinical  situations. eGFR's persistently <60 mL/min signify possible Chronic Kidney  Disease.   Georgiann Hahn gap 11/27/2017 7  5 - 15 Final   Performed at [facility], [address] [place], [place] [address]  . WBC 11/27/2017 15.4* 3.6 - 11.0 K/uL Final  . RBC 11/27/2017 3.64* 3.80 - 5.20 MIL/uL Final  . Hemoglobin 11/27/2017 12.1  12.0 - 16.0 g/dL Final  . HCT 11/27/2017 35.6  35.0 - 47.0 % Final  . MCV 11/27/2017 97.7  80.0 - 100.0 fL Final  . MCH 11/27/2017 33.3  26.0 - 34.0 pg Final  . MCHC 11/27/2017 34.1  32.0 - 36.0 g/dL Final  . RDW 11/27/2017 13.5  11.5 - 14.5 % Final  . Platelets 11/27/2017 305  150 - 440 K/uL Final   Performed at [facility], [address], [place], [place] [address]  . Color, Urine 11/27/2017 YELLOW* YELLOW Final  . APPearance 11/27/2017 CLEAR* CLEAR Final  . Specific Gravity, Urine 11/27/2017 1.019  1.005 - 1.030 Final  . pH 11/27/2017 6.0  5.0 - 8.0 Final  . Glucose, UA 11/27/2017 NEGATIVE  NEGATIVE mg/dL Final  . Hgb urine dipstick 11/27/2017 NEGATIVE  NEGATIVE Final  . Bilirubin Urine 11/27/2017 NEGATIVE  NEGATIVE Final  . Ketones, ur 11/27/2017 NEGATIVE  NEGATIVE mg/dL Final  . Protein, ur 11/27/2017 NEGATIVE  NEGATIVE mg/dL Final  . Nitrite 11/27/2017 NEGATIVE  NEGATIVE Final  . Leukocytes, UA 11/27/2017 TRACE* NEGATIVE Final  . RBC / HPF 11/27/2017 0-5  0 - 5 RBC/hpf Final  . WBC, UA 11/27/2017 6-30  0 - 5 WBC/hpf Final  . Bacteria, UA 11/27/2017 RARE* NONE SEEN Final  . Squamous Epithelial / LPF 11/27/2017 0-5* NONE SEEN Final  . Mucus 11/27/2017 PRESENT   Final   Performed at [facility], [address] [place], [place] [address]    Assessment:  Empress Newmann is a 62 y.o. female with a lesion in the right hemipelvis of unclear etiology.  She has a 3 month history of RLQ pain.  She has lost 30-35 pounds in the past 6 months intentionally.  Abdomen and pelvic CT on 08/15/2017 revealed no acute intra-abdominal or  intrapelvic abnormalities.  There was degenerative disc and facet disease changes of the lower lumbar spine with neural foraminal stenoses at L4-L5 and L5-S1 with question of a calcified disc fragment posterior to the L5 vertebral body on RIGHT.   Abdominal and pelvic CT on 11/28/2017 revealed an unusual thick-walled peripherally enhancing 3.7 cm cystic lesion at the right upper hemipelvis. This had increased in size since 08/2017, with minimal surrounding soft tissue inflammation. This may reflect a small bowel loop, in which case it raises concern for small bowel lymphoma.  A right adnexal cystic lesion was considered less likely.  Colon was largely filled with stool, raising concern for mild constipation.  She had an EGD and colonoscopy in her late 39s for reflux.  She denied any bleeding.  She is unsure about a diagnosis of Barrett's esophagus.  She is no longer taking Nexium.  She previously was seen in the GI [facility].  She is s/p TAH/BSO in 2008 for fibroids and menorrhagia.  Symptomatically, she has chronic abdominal pain.  She denies any B symptoms (she notes weight loss has been intentional).  Exam reveals no adenopathy or hepatosplenomegaly.  She has no guarding or rebound tenderness.  Plan: 1.  Review imaging studies with patient.  Etiology of thick walled peripherally enhancing lesion is unclear.  Recent CBC and CMP were normal.  Discuss plan for  additonal labs and further imaging.  Discuss PET scan versus repeat CT scan with oral contrast. 2.  Labs today:  LDH, uric acid, CEA, CA125. 3.  Present at tumor board.  Schedule imaging after review with radiology. 4.  Discuss future plans for follow-up with GI (? Barrett's esophagus) and ENT (chronic hoarseness). 5.  RTC after imaging to discuss direction of therapy.   Lequita Asal, MD  11/28/2017, 4:47 PM

## 2017-11-28 NOTE — ED Notes (Signed)
PT assisted to restroom. Gait steady. No distress noted. Smiling and talkative.

## 2017-11-28 NOTE — ED Notes (Signed)
Pt becomes diaphoretic and nauseated, cool cloth given

## 2017-11-29 ENCOUNTER — Other Ambulatory Visit: Payer: Self-pay | Admitting: Hematology and Oncology

## 2017-11-29 DIAGNOSIS — K6389 Other specified diseases of intestine: Secondary | ICD-10-CM

## 2017-11-29 LAB — CA 125: Cancer Antigen (CA) 125: 125.9 U/mL — ABNORMAL HIGH (ref 0.0–38.1)

## 2017-11-29 LAB — CEA: CEA: 1.1 ng/mL (ref 0.0–4.7)

## 2017-12-03 ENCOUNTER — Ambulatory Visit
Admission: RE | Admit: 2017-12-03 | Discharge: 2017-12-03 | Disposition: A | Discharge status: Home / Self Care | Payer: BC Managed Care – PPO | Source: Ambulatory Visit | Attending: Hematology and Oncology | Admitting: Hematology and Oncology

## 2017-12-03 DIAGNOSIS — K6389 Other specified diseases of intestine: Secondary | ICD-10-CM | POA: Diagnosis present

## 2017-12-03 MED ORDER — IOHEXOL 300 MG/ML  SOLN
100.0000 mL | Freq: Once | INTRAMUSCULAR | Status: AC | PRN
  Administered 2017-12-03: 100 mL via INTRAVENOUS

## 2017-12-04 ENCOUNTER — Ambulatory Visit: Payer: BC Managed Care – PPO

## 2017-12-05 ENCOUNTER — Ambulatory Visit: Payer: BC Managed Care – PPO | Admitting: Hematology and Oncology

## 2017-12-06 ENCOUNTER — Encounter: Payer: Self-pay | Admitting: Hematology and Oncology

## 2017-12-06 DIAGNOSIS — R1903 Right lower quadrant abdominal swelling, mass and lump: Secondary | ICD-10-CM | POA: Insufficient documentation

## 2017-12-06 DIAGNOSIS — R490 Dysphonia: Secondary | ICD-10-CM | POA: Insufficient documentation

## 2017-12-06 DIAGNOSIS — R634 Abnormal weight loss: Secondary | ICD-10-CM | POA: Insufficient documentation

## 2017-12-06 HISTORY — DX: Right lower quadrant abdominal swelling, mass and lump: R19.03

## 2017-12-06 HISTORY — DX: Abnormal weight loss: R63.4

## 2017-12-06 HISTORY — DX: Dysphonia: R49.0

## 2017-12-07 ENCOUNTER — Telehealth: Payer: Self-pay | Admitting: *Deleted

## 2017-12-07 ENCOUNTER — Encounter: Payer: Self-pay | Admitting: Hematology and Oncology

## 2017-12-07 ENCOUNTER — Inpatient Hospital Stay (HOSPITAL_BASED_OUTPATIENT_CLINIC_OR_DEPARTMENT_OTHER): Payer: BC Managed Care – PPO | Admitting: Hematology and Oncology

## 2017-12-07 VITALS — BP 131/85 | HR 85 | Temp 98.2°F | Resp 18 | Wt 152.6 lb

## 2017-12-07 DIAGNOSIS — E119 Type 2 diabetes mellitus without complications: Secondary | ICD-10-CM

## 2017-12-07 DIAGNOSIS — K2270 Barrett's esophagus without dysplasia: Secondary | ICD-10-CM

## 2017-12-07 DIAGNOSIS — I10 Essential (primary) hypertension: Secondary | ICD-10-CM

## 2017-12-07 DIAGNOSIS — K5900 Constipation, unspecified: Secondary | ICD-10-CM

## 2017-12-07 DIAGNOSIS — R1903 Right lower quadrant abdominal swelling, mass and lump: Secondary | ICD-10-CM

## 2017-12-07 NOTE — Progress Notes (Signed)
Patient states she is having problems having bowel movements.  Also states at times when she urinates she has pain in the bottom of her bladder.  A few days ago she had pain in her right side that has now resolved but she states she is sore in that area.  Patient states she got married yesterday.  Accompanied by her husband today.

## 2017-12-07 NOTE — Telephone Encounter (Signed)
Called patient to inform her that Dr. Mike Gip wants her to follow up with with Dr. Gustavo Lah.  Referral has been sent.  Patient verbalized understanding.

## 2017-12-07 NOTE — Telephone Encounter (Signed)
-----   Message from Karen Kitchens, NP sent at 12/07/2017  3:52 PM EDT ----- Regarding: FW: Follow-up with Dr. Gustavo Lah   ----- Message ----- From: Maryann Conners, [place] Sent: 12/07/2017   3:23 PM To: Maryann Conners, NT, Karen Kitchens, NP Subject: Follow-up with Dr. Durwin Glaze...   Per 12/07/17 los. Follow-up with Dr. Gustavo Lah (patient known to him).   Per Jinny Blossom at Dr. Gustavo Lah office stated that before patient can be scheduled. That You needed to Fax over Demographics, Recent Labs and Last 3 MD visits. And they will get her scheduled and call her with an appt date. Fax info to [phone]   [facility]

## 2017-12-07 NOTE — Progress Notes (Signed)
[facility] day:  12/07/2017   Chief Complaint: Haley Jones is a 62 y.o. female with abdominal pain and a right upper hemipelvis cystic lesion who is seen for review of imaging and discussion regarding direction of therapy.  HPI:  The patient was last seen in the medical oncology clinic on 11/28/2017 for new patient assessment.  She had RLQ abdominal pain x 3 months.  Imaging revealed an unusual thick-walled peripherally enhancing 3.7 cm cystic lesion at the right upper hemipelvis. Etiology was unclear.  We discussed additional imaging and laboratory evaluation.  CBC and CMP on 11/27/2017 were unremarkable.LDH and uric acid were normal.  CEA was 1.1.  CA125 was 125.9 (elevated).  She was presented at tumor board on 11/29/2017.  Decision was made to proceed with CT scan with oral contrast and delayed imaging to assess whether the lesion filled with contrast and was in the bowel.    Abdomen and pelvis CT on 12/03/2017 revealed a 2.5 cm right adnexal cystic structure, slightly enlarged since 2017.  There were no acute abdominal findings or lymphadenopathy.  She was represented at tumor board.  Decision was made to pursue surgical opinion given the apparent symptomatic lesion of unclear etiology (? Meckel's, adnexal lesion, other).  Symptomatically, she is feeling better today.  She notes a little pain in her right side (different from prior pain).  She had not had a bowel movement since 12/04/2017.  She denies any melena or hematochezia.   Past Medical History:  Diagnosis Date  . Anxiety   . Depression   . Diabetes mellitus without complication ([place])   . Hypertension     Past Surgical History:  Procedure Laterality Date  . ABDOMINAL HYSTERECTOMY  2008  . CESAREAN SECTION    . EYE SURGERY    . ROTATOR CUFF REPAIR Left 2011    Family History  Problem Relation Age of Onset  . Melanoma Mother   . Lung cancer Father   . Ovarian  cancer Maternal Grandmother     Social History:  reports that she has never smoked. She has never used smokeless tobacco. She reports that she does not drink alcohol or use drugs.  She denies any exposure to radiation or toxins.  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  She lives in [place].  She got married yesterday (12/06/2017).  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  The patient is accompanied by her husband, Hank, today.  Allergies:  Allergies  Allergen Reactions  . Etodolac Other (See Comments)    Made her feel "loopy"  . Hydrocodone-Acetaminophen Nausea Only and Nausea And Vomiting  . Orphenadrine Citrate Other (See Comments)    Made her feel "loopy"  . Penicillins Rash    Current Medications: Current Outpatient Medications  Medication Sig Dispense Refill  . chlorzoxazone (PARAFON) 500 MG tablet Take 500 mg by mouth 4 (four) times daily. PRN  0  . clonazePAM (KLONOPIN) 1 MG tablet Take 1 mg by mouth at bedtime.    . diclofenac (VOLTAREN) 75 MG EC tablet Take 75 mg by mouth daily.    . diphenhydrAMINE-APAP, sleep, (TYLENOL PM EXTRA STRENGTH PO) Take 1 tablet by mouth at bedtime.    . docusate sodium (COLACE) 100 MG capsule Take 1 capsule (100 mg total) by mouth daily as needed. 30 capsule 2  . estradiol (ESTRACE) 2 MG tablet Take 2 mg by mouth daily.  11  . glimepiride (AMARYL) 1 MG tablet  Take 1 mg by mouth daily.    Marland Kitchen lisinopril (PRINIVIL,ZESTRIL) 10 MG tablet Take 10 mg by mouth daily.    . metFORMIN (GLUCOPHAGE) 500 MG tablet Take 500 mg by mouth 2 (two) times daily.    . phentermine (ADIPEX-P) 37.5 MG tablet Take 37.5 mg by mouth daily.  0  . sertraline (ZOLOFT) 50 MG tablet Take 50 mg by mouth daily.    Marland Kitchen triamcinolone cream (KENALOG) 0.1 % Apply 2 application topically daily.     No current facility-administered medications for this visit.     Review of Systems:  GENERAL:  Feels better.  No fevers, sweats or weight loss.  Weight up 5 pounds. PERFORMANCE  STATUS (ECOG):  1 HEENT:  Hoarse x 20 years.  Runny nose with seasonal allergies.  No visual changes, runny nose, sore throat, mouth sores or tenderness. Lungs: No shortness of breath or cough.  No hemoptysis. Cardiac:  No chest pain, palpitations, orthopnea, or PND. GI:  Right lower abdominal pain quiescent today.  New mild right sided pain.  No bowel movement since 12/04/2017.  No nausea, vomiting, diarrhea, constipation, melena or hematochezia. GU:  No urgency, frequency, dysuria, or hematuria. Musculoskeletal:  Scoliosis.  Herniated disk.  Torn shoulder.  No muscle tenderness. Extremities:  No pain or swelling. Skin:  No rashes or skin changes. Neuro:  Chronic balance issues.  No headache, numbness or weakness, or coordination issues. Endocrine:  No diabetes, thyroid issues, hot flashes or night sweats. Psych:  No mood changes, depression or anxiety. Pain:  No acute abdominal pain today. Review of systems:  All other systems reviewed and found to be negative.   Physical Exam: Blood pressure 131/85, pulse 85, temperature 98.2 F (36.8 C), temperature source Tympanic, resp. rate 18, weight 152 lb 9 oz (69.2 kg). GENERAL:  Well developed, well nourished, woman sitting comfortably in the exam room in no acute distress. MENTAL STATUS:  Alert and oriented to person, place and time. HEAD:  Long graying hair.  Normocephalic, atraumatic, face symmetric, no Cushingoid features. EYES:  Glasses.  Blue eyes.  No conjunctivitis or scleral icterus. ENT:  Hoarse.  ABDOMEN:  Soft, minimally tender right upper portion of lower quadrant area without guarding or rebound tenderness.  Active bowel sounds, and no hepatosplenomegaly.  No masses. BACK:  No CVA tenderness. SKIN:  No rashes, ulcers or lesions. EXTREMITIES: No edema or skin discoloration.  NEUROLOGICAL: Unremarkable. PSYCH:  Appropriate.    No visits with results within 3 Day(s) from this visit.  Latest known visit with results is:   Appointment on 11/28/2017  Component Date Value Ref Range Status  . CEA 11/28/2017 1.1  0.0 - 4.7 ng/mL Final   Comment: (NOTE)                             Nonsmokers          <3.9                             Smokers             <5.6 Roche [employer] Electrochemiluminescence Immunoassay (ECLIA) Values obtained with different assay methods or kits cannot be used interchangeably.  Results cannot be interpreted as absolute evidence of the presence or absence of malignant disease. Performed At: [facility] [place], [place] [id number] Rush Farmer MD [id number] Performed at [facility]  [facility], [address], [place], [place] [address]   . Cancer Antigen (CA) 125 11/28/2017 125.9* 0.0 - 38.1 U/mL Final   Comment: (NOTE) Roche [employer]ostics Electrochemiluminescence Immunoassay (ECLIA) Values obtained with different assay methods or kits cannot be used interchangeably.  Results cannot be interpreted as absolute evidence of the presence or absence of malignant disease. Performed At: [facility] [place], [place] [id number] Rush Farmer MD [id number] Performed at [facility], [address], [place], [place] [address]   . Uric Acid, Serum 11/28/2017 4.7  2.3 - 6.6 mg/dL Final   Performed at [facility], [address] [place], [place] [address]  . LDH 11/28/2017 121  98 - 192 U/L Final   Performed at [facility], [address] [place],  [address]    Assessment:  Haley Jones is a 62 y.o. female with a lesion in the right hemipelvis of unclear etiology.  She has a 3 month history of RLQ pain.  She has lost 30-35 pounds in the past 6 months intentionally.  CA125 was 125.9 and CEA 1.1 on 11/28/2017.  Abdomen and pelvic CT on 08/15/2017 revealed no acute intra-abdominal or intrapelvic abnormalities.  There was degenerative disc and facet disease changes of the lower  lumbar spine with neural foraminal stenoses at L4-L5 and L5-S1 with question of a calcified disc fragment posterior to the L5 vertebral body on RIGHT.   Abdominal and pelvic CT on 11/28/2017 revealed an unusual thick-walled peripherally enhancing 3.7 cm cystic lesion at the right upper hemipelvis. This had increased in size since 08/2017, with minimal surrounding soft tissue inflammation. This may reflect a small bowel loop, in which case it raises concern for small bowel lymphoma.  A right adnexal cystic lesion was considered less likely.  Colon was largely filled with stool, raising concern for mild constipation.  Abdomen and pelvis CT on 12/03/2017 revealed a 2.5 cm right adnexal cystic structure, slightly enlarged since 2017.  There were no acute abdominal findings or lymphadenopathy.  She had an EGD and colonoscopy in her late 62s for reflux.  She denied any bleeding.  She is unsure about a diagnosis of Barrett's esophagus.  She is no longer taking Nexium.  She previously was seen in the GI [facility].  She is s/p TAH/BSO in 2008 for fibroids and menorrhagia.  Symptomatically, she has intermittent chronic RLQ abdominal pain.  She denies any melena or hematochezia.  Exam is stable.  Plan: 1.  Review interval labs and imaging studies.  Etiology of cystic lesion remains unclear.  Consider Meckel scan (technetium pertechnetate).  Discuss surgical opinion secondary to symptomatology. 2.  Refer to Dr. Tama High. 3.  Refer to GI (Dr. Gustavo Lah) for follow-up of Barrett's esophagus and h/o chronic constipation.  Patient known to him. 4.  RTC in 1 month for MD assessment and labs (CBC with diff, CMP, CA125).   Lequita Asal, MD  12/07/2017, 4:35 PM

## 2017-12-12 ENCOUNTER — Other Ambulatory Visit: Payer: Self-pay | Admitting: Urgent Care

## 2017-12-12 DIAGNOSIS — Z809 Family history of malignant neoplasm, unspecified: Secondary | ICD-10-CM

## 2017-12-12 DIAGNOSIS — Z1379 Encounter for other screening for genetic and chromosomal anomalies: Secondary | ICD-10-CM

## 2017-12-17 ENCOUNTER — Ambulatory Visit: Payer: BC Managed Care – PPO | Admitting: Surgery

## 2017-12-17 ENCOUNTER — Encounter: Payer: Self-pay | Admitting: Surgery

## 2017-12-17 VITALS — BP 122/74 | HR 88 | Temp 97.8°F | Ht <= 58 in | Wt 153.0 lb

## 2017-12-17 DIAGNOSIS — E8940 Asymptomatic postprocedural ovarian failure: Secondary | ICD-10-CM | POA: Diagnosis not present

## 2017-12-17 DIAGNOSIS — R1903 Right lower quadrant abdominal swelling, mass and lump: Secondary | ICD-10-CM | POA: Diagnosis not present

## 2017-12-17 NOTE — Progress Notes (Signed)
Surgical Clinic History and Physical  Referring provider:  Rusty Aus, MD [employer] [place], [place] [address]  HISTORY OF PRESENT ILLNESS (HPI):  62 y.o. female presents for evaluation of Right pelvic hollow visceral vs cystic structure present at least since CT first performed in 2017. Patient reports she initially presented to [facility] ED 4 months ago for RLQ abdominal pain, which she describes as she thought she was experiencing acute appendicitis, and presented again similarly 1 month ago. Patient also states she has a "lifelong history" of severe constipation, for which she was frequently given milk of magnesia as a child, and was experiencing constipation x 3+ days at the time of both her ED presentations. Following pain medication and oral contrast for both CT's, patient and her husband say she experienced "a blowout" of a BM, after which her pain resolved and did not return beyond those 2 episodes. She denies having previously taken a routine stool softener and is due for a colonoscopy (last being 10 years ago). She has experienced some blood per rectum, attributed to previously diagnosed hemorrhoids, denies any weight loss, and has actually gained some weight after her pain resolved and appetite has improved.  PAST MEDICAL HISTORY (PMH):  Past Medical History:  Diagnosis Date  . Anxiety   . Depression   . Diabetes mellitus without complication ([place])   . Hoarse 12/06/2017  . Hypertension   . Weight loss 12/06/2017     PAST SURGICAL HISTORY [facility]):  Past Surgical History:  Procedure Laterality Date  . ABDOMINAL HYSTERECTOMY  2008  . CESAREAN SECTION    . EYE SURGERY    . ROTATOR CUFF REPAIR Left 2011     MEDICATIONS:  Prior to Admission medications   Medication Sig Start Date End Date Taking? Authorizing Provider  chlorzoxazone (PARAFON) 500 MG tablet Take 500 mg by mouth 4 (four) times daily. PRN 10/01/17  Yes [provider]  clonazePAM (KLONOPIN) 1 MG tablet Take 1 mg by mouth at bedtime. 07/17/17  Yes [provider]  diclofenac (VOLTAREN) 75 MG EC tablet Take 75 mg by mouth daily. 08/13/17 02/09/18 Yes [provider]  diphenhydrAMINE-APAP, sleep, (TYLENOL PM EXTRA STRENGTH PO) Take 1 tablet by mouth at bedtime.   Yes [provider]  docusate sodium (COLACE) 100 MG capsule Take 1 capsule (100 mg total) by mouth daily as needed. 08/15/17 08/15/18 Yes McShane, Gerda Diss, MD  estradiol (ESTRACE) 2 MG tablet Take 2 mg by mouth daily. 11/04/17  Yes [provider]  glimepiride (AMARYL) 1 MG tablet Take 1 mg by mouth daily. 08/08/17  Yes [provider]  lisinopril (PRINIVIL,ZESTRIL) 10 MG tablet Take 10 mg by mouth daily. 02/13/17  Yes [provider]  metFORMIN (GLUCOPHAGE) 500 MG tablet Take 500 mg by mouth 2 (two) times daily. 07/11/17  Yes [provider]  phentermine (ADIPEX-P) 37.5 MG tablet Take 37.5 mg by mouth daily. 10/09/17  Yes [provider]  sertraline (ZOLOFT) 50 MG tablet Take 50 mg by mouth daily. 07/08/17  Yes [provider]  triamcinolone cream (KENALOG) 0.1 % Apply 2 application topically daily. 04/04/17 04/04/18 Yes [provider]     ALLERGIES:  Allergies  Allergen Reactions  . Etodolac Other (See Comments)    Made her feel "loopy"  . Hydrocodone-Acetaminophen Nausea Only and Nausea And Vomiting  . Orphenadrine Citrate Other (See Comments)    Made her feel "loopy"  . Penicillins Rash  SOCIAL HISTORY:  Social History   Socioeconomic History  . Marital status: Single    Spouse name: Not on file  . Number of children: Not on file  . Years of education: Not on file  . Highest education level: Not on file  Occupational History  . Not on file  Social Needs  . Financial resource strain: Not on file  . Food insecurity:    Worry: Not on file    Inability: Not on file  . Transportation needs:     Medical: Not on file    Non-medical: Not on file  Tobacco Use  . Smoking status: Never Smoker  . Smokeless tobacco: Never Used  Substance and Sexual Activity  . Alcohol use: No  . Drug use: No  . Sexual activity: Yes  Lifestyle  . Physical activity:    Days per week: Not on file    Minutes per session: Not on file  . Stress: Not on file  Relationships  . Social connections:    Talks on phone: Not on file    Gets together: Not on file    Attends religious service: Not on file    Active member of club or organization: Not on file    Attends meetings of clubs or organizations: Not on file    Relationship status: Not on file  . Intimate partner violence:    Fear of current or ex partner: Not on file    Emotionally abused: Not on file    Physically abused: Not on file    Forced sexual activity: Not on file  Other Topics Concern  . Not on file  Social History Narrative  . Not on file    The patient currently resides (home / rehab facility / nursing home): Home The patient normally is (ambulatory / bedbound): Ambulatory  FAMILY HISTORY:  Family History  Problem Relation Age of Onset  . Melanoma Mother   . Lung cancer Father   . Ovarian cancer Maternal Grandmother     Otherwise negative/non-contributory.  REVIEW OF SYSTEMS:  Constitutional: denies any other weight loss, fever, chills, or sweats  Eyes: denies any other vision changes, history of eye injury  ENT: denies sore throat, hearing problems  Respiratory: denies shortness of breath, wheezing  Cardiovascular: denies chest pain, palpitations  Gastrointestinal: abdominal pain, N/V, and bowel function as per HPI Musculoskeletal: denies any other joint pains or cramps  Skin: Denies any other rashes or skin discolorations Neurological: denies any other headache, dizziness, weakness  Psychiatric: Denies any other depression, anxiety   All other review of systems were otherwise negative   VITAL SIGNS:  BP 122/74    Pulse 88   Temp 97.8 F (36.6 C) (Oral)   Ht 4\' 10"  (1.473 m)   Wt 153 lb (69.4 kg)   BMI 31.98 kg/m    PHYSICAL EXAM:  Constitutional:  -- Overweight body habitus  -- Awake, alert, and oriented x3  Eyes:  -- Pupils equally round and reactive to light  -- No scleral icterus  Ear, nose, throat:  -- No jugular venous distension -- No nasal drainage, bleeding Pulmonary:  -- No crackles  -- Equal breath sounds bilaterally -- Breathing non-labored at rest Cardiovascular:  -- S1, S2 present  -- No pericardial rubs  Gastrointestinal:  -- Abdomen soft, completely nontender, non-distended, no guarding/rebound  -- No abdominal masses appreciated, pulsatile or otherwise  Musculoskeletal and Integumentary:  -- Wounds or skin discoloration: None appreciated -- Extremities: B/L UE and  LE FROM, hands and feet warm, no edema  Neurologic:  -- Motor function: Intact and symmetric -- Sensation: Intact and symmetric  Labs:  CBC Latest Ref Rng & Units 11/27/2017 08/15/2017 02/14/2016  WBC 3.6 - 11.0 K/uL 15.4(H) 9.6 5.2  Hemoglobin 12.0 - 16.0 g/dL 12.1 12.5 13.2  Hematocrit 35.0 - 47.0 % 35.6 36.6 38.1  Platelets 150 - 440 K/uL 305 225 200   CMP Latest Ref Rng & Units 11/27/2017 08/15/2017 02/14/2016  Glucose 65 - 99 mg/dL 103(H) 111(H) 77  BUN 6 - 20 mg/dL 26(H) 24(H) 20  Creatinine 0.44 - 1.00 mg/dL 0.74 1.36(H) 0.76  Sodium 135 - 145 mmol/L 135 136 136  Potassium 3.5 - 5.1 mmol/L 4.8 4.3 4.0  Chloride 101 - 111 mmol/L 98(L) 101 101  CO2 22 - 32 mmol/L 30 24 26   Calcium 8.9 - 10.3 mg/dL 9.7 9.3 9.5  Total Protein 6.5 - 8.1 g/dL 7.3 6.8 7.3  Total Bilirubin 0.3 - 1.2 mg/dL 0.4 0.6 0.2(L)  Alkaline Phos 38 - 126 U/L 67 59 59  AST 15 - 41 U/L 22 22 15   ALT 14 - 54 U/L 24 26 16     Imaging studies:  CT Abdomen and Pelvis with Contrast (12/03/2017) - personally reviewed and compared with prior CT's back to 2017 with patient and her husband The stomach, duodenum, small bowel and colon are  grossly  normal without oral contrast. No inflammatory changes, mass lesions or obstructive findings. The terminal ileum and appendix  are normal. The uterus is surgically absent. The left ovary is not identified. There is a persistent thick-walled right adnexal complex cyst. This measures 2.5 cm. It was present on the prior CT scan from 2017 and has enlarged slightly since that time. Recommend a follow-up pelvic ultrasound examination in 4-6 months to reassess. No pelvic mass or adenopathy. No free pelvic fluid collections. No inguinal mass or adenopathy.  Assessment/Plan:  62 y.o. female with Right pelvic/adnexal cystic structure vs hollow viscus (such as Meckel's diverticulum despite non-opacification with enteric contrast, complicated by co-morbidities including obesity (BMI 32), DM, HTN, generalized anxiety disorder, and major depression disorder.   - maintain hydration + high-fiber diet  - BID Colace stool softener + magnesium citrate daily until BM's  - discussed with patient surgical intervention as her diagnostic study vs further workup as long as her pain does not return/worsen meanwhile, to which patient replied she does not want to be cut on" until known for what and anticipated to make feel better  - return to clinic in 3 weeks, instructed to call if any questions or concerns  - keep 5/21 GI appointment to evaluate for/before colonoscopy  All of the above recommendations were discussed with the patient and patient's husband, and all of patient's and family's questions were answered to their expressed satisfaction.  Thank you for the opportunity to participate in this patient's care.  -- Marilynne Drivers Rosana Hoes, MD, [place]: [facility] - Partnering for exceptional care. Office: [phone]

## 2017-12-17 NOTE — Patient Instructions (Signed)
Please start taking Magnesium Citrate once a day until you do not have constipation.   Please take Colace one tablet twice a day to help with your constipation.

## 2017-12-21 ENCOUNTER — Telehealth: Payer: Self-pay | Admitting: Genetic Counselor

## 2017-12-21 ENCOUNTER — Encounter: Payer: Self-pay | Admitting: Genetic Counselor

## 2017-12-21 NOTE — Telephone Encounter (Signed)
Referral received from Dr. Mike Gip for the pt to see a genetic counselor. Appt has been scheduled for the pt to see Roma Kayser on 6/19 at 2pm. Pt works for the school system and preferred appt after 6/14. Letter mailed with directions to our facility.

## 2017-12-24 ENCOUNTER — Telehealth: Payer: Self-pay | Admitting: General Practice

## 2017-12-24 NOTE — Telephone Encounter (Signed)
-----   Message from Mickie Kay sent at 12/21/2017  5:21 PM EDT ----- Regarding: Msg left on VM Patient left a message on VM 12/21/17. She was returning your call about rescheduling her appt.

## 2017-12-24 NOTE — Telephone Encounter (Signed)
Left another message for the patient to call the office, appointment needs to be r/s.

## 2018-01-01 ENCOUNTER — Other Ambulatory Visit: Payer: Self-pay | Admitting: Gastroenterology

## 2018-01-01 ENCOUNTER — Telehealth: Payer: Self-pay

## 2018-01-01 DIAGNOSIS — R1903 Right lower quadrant abdominal swelling, mass and lump: Secondary | ICD-10-CM

## 2018-01-01 DIAGNOSIS — N9489 Other specified conditions associated with female genital organs and menstrual cycle: Secondary | ICD-10-CM

## 2018-01-01 NOTE — Telephone Encounter (Signed)
Received call from C. London NP. She would like to refer Ms. Arlis Porta to [place] for 2.5 cm right adnexal cystic structure, slightly enlarged since 2017. Referral order placed. Contacted Ms. Arlis Porta and we will see her in the [facility] 5/22 at 1415. She has [place] scheduled for 5/24. Oncology Nurse Navigator Documentation  Navigator Location: [facility] (01/01/18 1600)   )Navigator Encounter Type: Telephone (01/01/18 1600) Telephone: Incoming Call;Outgoing Call;Appt Confirmation/Clarification (01/01/18 1600)                       Barriers/Navigation Needs: Coordination of Care (01/01/18 1600)   Interventions: Referrals (01/01/18 1600)                      Time Spent with Patient: 15 (01/01/18 1600)

## 2018-01-02 ENCOUNTER — Inpatient Hospital Stay: Payer: BC Managed Care – PPO

## 2018-01-02 ENCOUNTER — Telehealth: Payer: Self-pay

## 2018-01-02 NOTE — Telephone Encounter (Signed)
Pt called to cancel appt today-will call us to R/S waiting until after her Colonoscopy/Endo appts

## 2018-01-03 ENCOUNTER — Telehealth: Payer: Self-pay | Admitting: *Deleted

## 2018-01-03 NOTE — Telephone Encounter (Signed)
Called patient @ 8:40 in regards to getting her 01/10/18 lab/MD appt.R/S. She didn't answer so a message was left on her vmail. to give the office a call back so that we can get her R/S for another day that's best for her.

## 2018-01-04 ENCOUNTER — Ambulatory Visit: Payer: BC Managed Care – PPO

## 2018-01-08 ENCOUNTER — Ambulatory Visit: Payer: Self-pay | Admitting: Surgery

## 2018-01-09 ENCOUNTER — Ambulatory Visit: Payer: Self-pay | Admitting: Surgery

## 2018-01-09 ENCOUNTER — Other Ambulatory Visit: Payer: Self-pay | Admitting: *Deleted

## 2018-01-09 DIAGNOSIS — R1903 Right lower quadrant abdominal swelling, mass and lump: Secondary | ICD-10-CM

## 2018-01-10 ENCOUNTER — Inpatient Hospital Stay: Payer: BC Managed Care – PPO | Admitting: Hematology and Oncology

## 2018-01-10 ENCOUNTER — Inpatient Hospital Stay: Payer: BC Managed Care – PPO

## 2018-01-10 NOTE — Progress Notes (Deleted)
[facility] day:  01/10/2018    Chief Complaint: Haley Jones is a 62 y.o. female with abdominal pain and a right upper hemipelvis cystic lesion who is seen for 1 month assessment.  HPI:  The patient was last seen in the medical oncology clinic on 12/07/2017.  She had intermittent chronic RLQ abdominal pain.  She denied any melena or hematochezia.  Exam was stable.  She was referred to Dr Tama High, surgeon.  She was referred to Dr. Gustavo Lah for reassessment of barrett's and chronic constipation.  She was seen by Tama High on 12/17/2017.  She was felt to have a right pelvic/adnexal cystic structure vs hollow viscus (such as Meckel's diverticulum despite non-opacification with enteric contrast, complicated by co-morbidities including obesity (BMI 32), DM, HTN, generalized anxiety disorder, and major depression disorder.  She was to maintain hydration + high-fiber diet.  She was to take Colace stool softener BID + magnesium citrate daily until BM's.  He ddiscussed surgical intervention as her diagnostic study vs further workup as long as her pain does not return/worsen meanwhile.  She did not want to be "cut on".  She was scheduled for 3 week follow-up.  She was seen by Haley November, NP on 01/01/2018.  She is scheduled for EGD on 02/12/2018.  She was scheduled for GYN-oncology consultation.  During the interim,   Past Medical History:  Diagnosis Date  . Anxiety   . Depression   . Diabetes mellitus without complication ([place])   . Hoarse 12/06/2017  . Hypertension   . Weight loss 12/06/2017    Past Surgical History:  Procedure Laterality Date  . ABDOMINAL HYSTERECTOMY  2008  . CESAREAN SECTION    . EYE SURGERY    . ROTATOR CUFF REPAIR Left 2011    Family History  Problem Relation Age of Onset  . Melanoma Mother   . Lung cancer Father   . Ovarian cancer Maternal Grandmother     Social History:  reports that she has never  smoked. She has never used smokeless tobacco. She reports that she does not drink alcohol or use drugs.  She denies any exposure to radiation or toxins.  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  She lives in [place].  She got married yesterday (12/06/2017).  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  The patient is accompanied by her husband, Haley Jones, today.  Allergies:  Allergies  Allergen Reactions  . Etodolac Other (See Comments)    Made her feel "loopy"  . Hydrocodone-Acetaminophen Nausea Only and Nausea And Vomiting  . Orphenadrine Citrate Other (See Comments)    Made her feel "loopy"  . Penicillins Rash    Current Medications: Current Outpatient Medications  Medication Sig Dispense Refill  . chlorzoxazone (PARAFON) 500 MG tablet Take 500 mg by mouth 4 (four) times daily. PRN  0  . clonazePAM (KLONOPIN) 1 MG tablet Take 1 mg by mouth at bedtime.    . diclofenac (VOLTAREN) 75 MG EC tablet Take 75 mg by mouth daily.    . diphenhydrAMINE-APAP, sleep, (TYLENOL PM EXTRA STRENGTH PO) Take 1 tablet by mouth at bedtime.    . docusate sodium (COLACE) 100 MG capsule Take 1 capsule (100 mg total) by mouth daily as needed. 30 capsule 2  . estradiol (ESTRACE) 2 MG tablet Take 2 mg by mouth daily.  11  . glimepiride (AMARYL) 1 MG tablet Take 1 mg by mouth daily.    Marland Kitchen lisinopril (  PRINIVIL,ZESTRIL) 10 MG tablet Take 10 mg by mouth daily.    . metFORMIN (GLUCOPHAGE) 500 MG tablet Take 500 mg by mouth 2 (two) times daily.    . phentermine (ADIPEX-P) 37.5 MG tablet Take 37.5 mg by mouth daily.  0  . sertraline (ZOLOFT) 50 MG tablet Take 50 mg by mouth daily.    Marland Kitchen triamcinolone cream (KENALOG) 0.1 % Apply 2 application topically daily.     No current facility-administered medications for this visit.     Review of Systems:  GENERAL:  Feels better.  No fevers, sweats or weight loss.  Weight up 5 pounds. PERFORMANCE STATUS (ECOG):  1 HEENT:  Hoarse x 20 years.  Runny nose with seasonal  allergies.  No visual changes, runny nose, sore throat, mouth sores or tenderness. Lungs: No shortness of breath or cough.  No hemoptysis. Cardiac:  No chest pain, palpitations, orthopnea, or PND. GI:  Right lower abdominal pain quiescent today.  New mild right sided pain.  No bowel movement since 12/04/2017.  No nausea, vomiting, diarrhea, constipation, melena or hematochezia. GU:  No urgency, frequency, dysuria, or hematuria. Musculoskeletal:  Scoliosis.  Herniated disk.  Torn shoulder.  No muscle tenderness. Extremities:  No pain or swelling. Skin:  No rashes or skin changes. Neuro:  Chronic balance issues.  No headache, numbness or weakness, or coordination issues. Endocrine:  No diabetes, thyroid issues, hot flashes or night sweats. Psych:  No mood changes, depression or anxiety. Pain:  No acute abdominal pain today. Review of systems:  All other systems reviewed and found to be negative.   GENERAL:  Feels good.  Active.  No fevers, sweats or weight loss. PERFORMANCE STATUS (ECOG):  *** HEENT:  No visual changes, runny nose, sore throat, mouth sores or tenderness. Lungs: No shortness of breath or cough.  No hemoptysis. Cardiac:  No chest pain, palpitations, orthopnea, or PND. GI:  No nausea, vomiting, diarrhea, constipation, melena or hematochezia. GU:  No urgency, frequency, dysuria, or hematuria. Musculoskeletal:  No back pain.  No joint pain.  No muscle tenderness. Extremities:  No pain or swelling. Skin:  No rashes or skin changes. Neuro:  No headache, numbness or weakness, balance or coordination issues. Endocrine:  No diabetes, thyroid issues, hot flashes or night sweats. Psych:  No mood changes, depression or anxiety. Pain:  No focal pain. Review of systems:  All other systems reviewed and found to be negative.   Physical Exam: There were no vitals taken for this visit. GENERAL:  Well developed, well nourished, woman sitting comfortably in the exam room in no acute  distress. MENTAL STATUS:  Alert and oriented to person, place and time. HEAD:  Long graying hair.  Normocephalic, atraumatic, face symmetric, no Cushingoid features. EYES:  Glasses.  Blue eyes.  No conjunctivitis or scleral icterus. ENT:  Hoarse.  ABDOMEN:  Soft, minimally tender right upper portion of lower quadrant area without guarding or rebound tenderness.  Active bowel sounds, and no hepatosplenomegaly.  No masses. BACK:  No CVA tenderness. SKIN:  No rashes, ulcers or lesions. EXTREMITIES: No edema or skin discoloration.  NEUROLOGICAL: Unremarkable. PSYCH:  Appropriate.   GENERAL:  Well developed, well nourished, woman sitting comfortably in the exam room in no acute distress. MENTAL STATUS:  Alert and oriented to person, place and time. HEAD:  *** hair.  Normocephalic, atraumatic, face symmetric, no Cushingoid features. EYES:  *** eyes.  Pupils equal round and reactive to light and accomodation.  No conjunctivitis or scleral icterus. ENT:  Oropharynx clear without lesion.  Tongue normal. Mucous membranes moist.  RESPIRATORY:  Clear to auscultation without rales, wheezes or rhonchi. CARDIOVASCULAR:  Regular rate and rhythm without murmur, rub or gallop. ABDOMEN:  Soft, non-tender, with active bowel sounds, and no hepatosplenomegaly.  No masses. SKIN:  No rashes, ulcers or lesions. EXTREMITIES: No edema, no skin discoloration or tenderness.  No palpable cords. LYMPH NODES: No palpable cervical, supraclavicular, axillary or inguinal adenopathy  NEUROLOGICAL: Unremarkable. PSYCH:  Appropriate.    No visits with results within 3 Day(s) from this visit.  Latest known visit with results is:  Appointment on 11/28/2017  Component Date Value Ref Range Status  . CEA 11/28/2017 1.1  0.0 - 4.7 ng/mL Final   Comment: (NOTE)                             Nonsmokers          <3.9                             Smokers             <5.6 Roche [employer] Electrochemiluminescence  Immunoassay (ECLIA) Values obtained with different assay methods or kits cannot be used interchangeably.  Results cannot be interpreted as absolute evidence of the presence or absence of malignant disease. Performed At: [facility] [place], [place] [id number] Rush Farmer MD [id number] Performed at [facility], [address] [place], [place] [address]   . Cancer Antigen (CA) 125 11/28/2017 125.9* 0.0 - 38.1 U/mL Final   Comment: (NOTE) Roche [employer]ostics Electrochemiluminescence Immunoassay (ECLIA) Values obtained with different assay methods or kits cannot be used interchangeably.  Results cannot be interpreted as absolute evidence of the presence or absence of malignant disease. Performed At: [facility] [place], [place] [id number] Rush Farmer MD [id number] Performed at [facility], [address] [place], [place] [address]   . Uric Acid, Serum 11/28/2017 4.7  2.3 - 6.6 mg/dL Final   Performed at [facility], [address], [place], [place] [address]  . LDH 11/28/2017 121  98 - 192 U/L Final   Performed at [facility], [address], [place], [place] [address]    Assessment:  Mahathi Pokorney is a 62 y.o. female with a lesion in the right hemipelvis of unclear etiology.  She has a 3 month history of RLQ pain.  She has lost 30-35 pounds in the past 6 months intentionally.  CA125 was 125.9 and CEA 1.1 on 11/28/2017.  Abdomen and pelvic CT on 08/15/2017 revealed no acute intra-abdominal or intrapelvic abnormalities.  There was degenerative disc and facet disease changes of the lower lumbar spine with neural foraminal stenoses at L4-L5 and L5-S1 with question of a calcified disc fragment posterior to the L5 vertebral body on RIGHT.   Abdominal and pelvic CT on 11/28/2017 revealed an unusual thick-walled peripherally enhancing 3.7 cm cystic lesion at the right  upper hemipelvis. This had increased in size since 08/2017, with minimal surrounding soft tissue inflammation. This may reflect a small bowel loop, in which case it raises concern for small bowel lymphoma.  A right adnexal cystic lesion was considered less likely.  Colon was largely filled with stool, raising concern for mild constipation.  Abdomen and pelvis CT on 12/03/2017 revealed a 2.5 cm right adnexal cystic structure,  slightly enlarged since 2017.  There were no acute abdominal findings or lymphadenopathy.  She had an EGD and colonoscopy in her late 61s for reflux.  She denied any bleeding.  She is unsure about a diagnosis of Barrett's esophagus.  She is no longer taking Nexium.  She previously was seen in the GI [facility].  She is s/p TAH/BSO in 2008 for fibroids and menorrhagia.  Symptomatically, she has intermittent chronic RLQ abdominal pain.  She denies any melena or hematochezia.  Exam is stable.  Plan: 1.  Labs today:  CBC with diff, CMP, CA125.   Review interval labs and imaging studies.  Etiology of cystic lesion remains unclear.  Consider Meckel scan (technetium pertechnetate).  Discuss surgical opinion secondary to symptomatology. 2.  Refer to Dr. Tama High. 3.  Refer to GI (Dr. Gustavo Lah) for follow-up of Barrett's esophagus and h/o chronic constipation.  Patient known to him. 4.  RTC in 1 month for MD assessment and labs (CBC with diff, CMP, CA125).   Lequita Asal, MD  01/10/2018, 5:39 AM   I saw and evaluated the patient, participating in the key portions of the service and reviewing pertinent diagnostic studies and records.  I reviewed the nurse practitioner's note and agree with the findings and the plan.  The assessment and plan were discussed with the patient.  Additional diagnostic studies of *** are needed to clarify *** and would change the clinical management.  A few ***multiple questions were asked by the patient and answered.   Nolon Stalls,  MD 01/10/2018,5:39 AM

## 2018-01-11 ENCOUNTER — Ambulatory Visit: Payer: Self-pay | Admitting: Surgery

## 2018-01-22 ENCOUNTER — Telehealth: Payer: Self-pay | Admitting: *Deleted

## 2018-01-22 NOTE — Telephone Encounter (Signed)
Per Gaspar Bidding to see patient next week, then we will refer to GYN Onc for an official opinion.  Called her x3 to make her aware that her New appt was scheduled for 01/29/18 for Lab/MD Message was left on patient's vmail.

## 2018-01-25 ENCOUNTER — Ambulatory Visit: Payer: BC Managed Care – PPO | Admitting: Hematology and Oncology

## 2018-01-25 ENCOUNTER — Other Ambulatory Visit: Payer: BC Managed Care – PPO

## 2018-01-25 ENCOUNTER — Ambulatory Visit
Admission: RE | Admit: 2018-01-25 | Discharge: 2018-01-25 | Disposition: A | Discharge status: Home / Self Care | Payer: BC Managed Care – PPO | Source: Ambulatory Visit | Attending: Gastroenterology | Admitting: Gastroenterology

## 2018-01-25 DIAGNOSIS — N9489 Other specified conditions associated with female genital organs and menstrual cycle: Secondary | ICD-10-CM

## 2018-01-25 DIAGNOSIS — Z90710 Acquired absence of both cervix and uterus: Secondary | ICD-10-CM | POA: Diagnosis not present

## 2018-01-25 DIAGNOSIS — Z90722 Acquired absence of ovaries, bilateral: Secondary | ICD-10-CM | POA: Diagnosis not present

## 2018-01-25 DIAGNOSIS — K2270 Barrett's esophagus without dysplasia: Secondary | ICD-10-CM | POA: Insufficient documentation

## 2018-01-25 DIAGNOSIS — N949 Unspecified condition associated with female genital organs and menstrual cycle: Secondary | ICD-10-CM | POA: Diagnosis not present

## 2018-01-28 ENCOUNTER — Other Ambulatory Visit: Payer: BC Managed Care – PPO

## 2018-01-28 ENCOUNTER — Ambulatory Visit: Payer: BC Managed Care – PPO | Admitting: Hematology and Oncology

## 2018-01-29 ENCOUNTER — Telehealth: Payer: Self-pay

## 2018-01-29 ENCOUNTER — Ambulatory Visit: Payer: BC Managed Care – PPO | Admitting: Hematology and Oncology

## 2018-01-29 ENCOUNTER — Other Ambulatory Visit: Payer: BC Managed Care – PPO

## 2018-01-29 NOTE — Telephone Encounter (Signed)
Attempted to contact Ms. Greenman for [employer]. Informed her that Stephens November would like for her to be seen in clinic now and not wait on her colonoscopy. Ms. Shreeve has appt with Dr. Mike Gip in [facility] 6/19. We will see her tomorrow but connection on telephone kept getting lost. Dr. Kem Parkinson team will see her and attempt to get her to come to [facility] to see Gyn tomorrow. Oncology Nurse Navigator Documentation  Navigator Location: [facility] (01/29/18 1600)   )Navigator Encounter Type: Telephone (01/29/18 1600) Telephone: Outgoing Call (01/29/18 1600)                                                  Time Spent with Patient: 15 (01/29/18 1600)

## 2018-01-30 ENCOUNTER — Other Ambulatory Visit: Payer: BC Managed Care – PPO

## 2018-01-30 ENCOUNTER — Inpatient Hospital Stay: Payer: BC Managed Care – PPO | Attending: Hematology and Oncology

## 2018-01-30 ENCOUNTER — Encounter: Payer: BC Managed Care – PPO | Admitting: Genetic Counselor

## 2018-01-30 ENCOUNTER — Encounter: Payer: Self-pay | Admitting: Hematology and Oncology

## 2018-01-30 ENCOUNTER — Encounter: Payer: Self-pay | Admitting: Obstetrics and Gynecology

## 2018-01-30 ENCOUNTER — Inpatient Hospital Stay (HOSPITAL_BASED_OUTPATIENT_CLINIC_OR_DEPARTMENT_OTHER): Payer: BC Managed Care – PPO | Admitting: Hematology and Oncology

## 2018-01-30 ENCOUNTER — Inpatient Hospital Stay (HOSPITAL_BASED_OUTPATIENT_CLINIC_OR_DEPARTMENT_OTHER): Payer: BC Managed Care – PPO | Admitting: Obstetrics and Gynecology

## 2018-01-30 VITALS — BP 123/82 | HR 84 | Temp 97.7°F | Resp 18 | Wt 156.7 lb

## 2018-01-30 VITALS — BP 127/79 | HR 86 | Temp 97.6°F | Resp 18 | Ht <= 58 in | Wt 156.7 lb

## 2018-01-30 DIAGNOSIS — Z90722 Acquired absence of ovaries, bilateral: Secondary | ICD-10-CM | POA: Diagnosis not present

## 2018-01-30 DIAGNOSIS — R634 Abnormal weight loss: Secondary | ICD-10-CM | POA: Diagnosis not present

## 2018-01-30 DIAGNOSIS — R102 Pelvic and perineal pain: Secondary | ICD-10-CM

## 2018-01-30 DIAGNOSIS — K2270 Barrett's esophagus without dysplasia: Secondary | ICD-10-CM | POA: Diagnosis not present

## 2018-01-30 DIAGNOSIS — J3489 Other specified disorders of nose and nasal sinuses: Secondary | ICD-10-CM | POA: Insufficient documentation

## 2018-01-30 DIAGNOSIS — R1903 Right lower quadrant abdominal swelling, mass and lump: Secondary | ICD-10-CM

## 2018-01-30 DIAGNOSIS — Z808 Family history of malignant neoplasm of other organs or systems: Secondary | ICD-10-CM

## 2018-01-30 DIAGNOSIS — K5909 Other constipation: Secondary | ICD-10-CM | POA: Insufficient documentation

## 2018-01-30 DIAGNOSIS — Z801 Family history of malignant neoplasm of trachea, bronchus and lung: Secondary | ICD-10-CM

## 2018-01-30 DIAGNOSIS — R1031 Right lower quadrant pain: Secondary | ICD-10-CM

## 2018-01-30 DIAGNOSIS — Z79899 Other long term (current) drug therapy: Secondary | ICD-10-CM | POA: Insufficient documentation

## 2018-01-30 DIAGNOSIS — E119 Type 2 diabetes mellitus without complications: Secondary | ICD-10-CM

## 2018-01-30 DIAGNOSIS — R12 Heartburn: Secondary | ICD-10-CM | POA: Insufficient documentation

## 2018-01-30 DIAGNOSIS — R490 Dysphonia: Secondary | ICD-10-CM | POA: Diagnosis not present

## 2018-01-30 DIAGNOSIS — Z8041 Family history of malignant neoplasm of ovary: Secondary | ICD-10-CM | POA: Insufficient documentation

## 2018-01-30 DIAGNOSIS — Z7984 Long term (current) use of oral hypoglycemic drugs: Secondary | ICD-10-CM | POA: Insufficient documentation

## 2018-01-30 DIAGNOSIS — F418 Other specified anxiety disorders: Secondary | ICD-10-CM | POA: Diagnosis not present

## 2018-01-30 DIAGNOSIS — Z90710 Acquired absence of both cervix and uterus: Secondary | ICD-10-CM

## 2018-01-30 DIAGNOSIS — R1900 Intra-abdominal and pelvic swelling, mass and lump, unspecified site: Secondary | ICD-10-CM

## 2018-01-30 DIAGNOSIS — R5381 Other malaise: Secondary | ICD-10-CM | POA: Insufficient documentation

## 2018-01-30 DIAGNOSIS — I10 Essential (primary) hypertension: Secondary | ICD-10-CM | POA: Diagnosis not present

## 2018-01-30 DIAGNOSIS — R5383 Other fatigue: Secondary | ICD-10-CM | POA: Diagnosis not present

## 2018-01-30 LAB — CBC WITH DIFFERENTIAL/PLATELET
Basophils Absolute: 0 10*3/uL (ref 0–0.1)
Basophils Relative: 1 %
Eosinophils Absolute: 0.2 10*3/uL (ref 0–0.7)
Eosinophils Relative: 3 %
HCT: 31.7 % — ABNORMAL LOW (ref 35.0–47.0)
Hemoglobin: 10.8 g/dL — ABNORMAL LOW (ref 12.0–16.0)
Lymphocytes Relative: 30 %
Lymphs Abs: 1.4 10*3/uL (ref 1.0–3.6)
MCH: 32.6 pg (ref 26.0–34.0)
MCHC: 34.1 g/dL (ref 32.0–36.0)
MCV: 95.6 fL (ref 80.0–100.0)
Monocytes Absolute: 0.3 10*3/uL (ref 0.2–0.9)
Monocytes Relative: 7 %
Neutro Abs: 2.8 10*3/uL (ref 1.4–6.5)
Neutrophils Relative %: 59 %
Platelets: 209 10*3/uL (ref 150–440)
RBC: 3.31 MIL/uL — ABNORMAL LOW (ref 3.80–5.20)
RDW: 12.7 % (ref 11.5–14.5)
WBC: 4.6 10*3/uL (ref 3.6–11.0)

## 2018-01-30 LAB — COMPREHENSIVE METABOLIC PANEL
ALT: 15 U/L (ref 14–54)
AST: 19 U/L (ref 15–41)
Albumin: 3.6 g/dL (ref 3.5–5.0)
Alkaline Phosphatase: 55 U/L (ref 38–126)
Anion gap: 11 (ref 5–15)
BUN: 18 mg/dL (ref 6–20)
CO2: 25 mmol/L (ref 22–32)
Calcium: 8.8 mg/dL — ABNORMAL LOW (ref 8.9–10.3)
Chloride: 101 mmol/L (ref 101–111)
Creatinine, Ser: 0.86 mg/dL (ref 0.44–1.00)
GFR calc Af Amer: >60 mL/min (ref >60)
GFR calc non Af Amer: >60 mL/min (ref >60)
Glucose, Bld: 88 mg/dL (ref 65–99)
Potassium: 4.6 mmol/L (ref 3.5–5.1)
Sodium: 137 mmol/L (ref 135–145)
Total Bilirubin: 0.6 mg/dL (ref 0.3–1.2)
Total Protein: 7 g/dL (ref 6.5–8.1)

## 2018-01-30 NOTE — Progress Notes (Signed)
Gynecologic Oncology Consult Visit   Referring Provider: Dr Mike Gip  Chief Concern: Painful right lower quadrant mass  Subjective:  Haley Jones is a 62 y.o. P47 female who is seen in consultation from Dr. Sabra Heck for painful RLQ mass.  Presented to [facility] ED 1/19 for abdominal pain after recent treatment for UTI.  CT scan was unrevealing.  SCH/BSO 2008 for benign cysts by Dr Enzo Bi.  CA125 4/19 = 125, CEA = 1.1  CT scan 12/03/17 IMPRESSION: 1. 2.5 cm right adnexal cystic structure, slightly enlarged since  2017. Correlation and follow-up with pelvic ultrasound in 4-6 months is suggested. 2. No acute abdominal findings or lymphadenopathy.  An unusual peripherally enhancing 3.7 cm cystic lesion is noted at the upper right hemipelvis, with a thick wall; this has increased in size since January, with minimal surrounding soft tissue inflammation extending up the mesentery. This may reflect a small bowel loop, in which case it raises concern for lymphoma. A right adnexal cystic lesion is considered less likely, given mesenteric Inflammation.  Saw Dr Rosana Hoes in general surgery 5/19 and he suggested surgery to remove the mass, but she was not ready to proceed at that time.   Pelvic US 01/25/18 IMPRESSION: 1. 3.8 x 2.8 x 3.8 cm complex hypoechoic cystic lesion within the right adnexa, corresponding with lesion seen on prior CTs. Finding is indeterminate, but appears to demonstrate peripheral solid component with associated vascularity. Gynecologic referral for further workup and evaluation recommended. Additionally, further assessment with dedicated pelvic MRI, with and without contrast, suggested for further evaluation. 2. Status post hysterectomy and bilateral oophorectomy. No other pelvic or adnexal mass.  She continues to have pain localized to the RLQ. No GI/GU complaints, except some constipation.  Problem List: Patient Active Problem List   Diagnosis Date Noted  .  Right lower quadrant abdominal mass 12/06/2017  . Weight loss 12/06/2017  . Hoarse 12/06/2017  . Lumbar disc disease 07/26/2017  . Diabetes mellitus type 2, controlled, without complications ([place]) 27/51/7001  . 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 Adnexal mass   . Anxiety   . Depression   . Diabetes mellitus without complication ([place])   . Hoarse 12/06/2017  . Hypertension   . Weight loss 12/06/2017    Past Surgical History: Past Surgical History:  Procedure Laterality Date  . ABDOMINAL HYSTERECTOMY  2008  . CESAREAN SECTION    . EYE SURGERY    . ROTATOR CUFF REPAIR Left 2011      Family History: Family History  Problem Relation Age of Onset  . Melanoma Mother   . Lung cancer Father   . Ovarian cancer Maternal Grandmother     Social History: Social History   Socioeconomic History  . Marital status: Single    Spouse name: Not on file  . Number of children: Not on file  . Years of education: Not on file  . Highest education level: Not on file  Occupational History  . Not on file  Social Needs  . Financial resource strain: Not on file  . Food insecurity:    Worry: Not on file    Inability: Not on file  . Transportation needs:    Medical: Not on file    Non-medical: Not on file  Tobacco Use  . Smoking status: Never Smoker  . Smokeless tobacco: Never Used  Substance and Sexual Activity  . Alcohol use: No  . Drug use: No  .  Sexual activity: Yes  Lifestyle  . Physical activity:    Days per week: Not on file    Minutes per session: Not on file  . Stress: Not on file  Relationships  . Social connections:    Talks on phone: Not on file    Gets together: Not on file    Attends religious service: Not on file    Active member of club or organization: Not on file    Attends meetings of clubs or organizations: Not on file    Relationship status: Not on file  . Intimate partner violence:    Fear of  current or ex partner: Not on file    Emotionally abused: Not on file    Physically abused: Not on file    Forced sexual activity: Not on file  Other Topics Concern  . Not on file  Social History Narrative  . Not on file    Allergies: Allergies  Allergen Reactions  . Etodolac Other (See Comments)    Made her feel "loopy"  . Hydrocodone-Acetaminophen Nausea Only and Nausea And Vomiting  . Orphenadrine Citrate Other (See Comments)    Made her feel "loopy"  . Penicillins Rash    Current Medications: Current Outpatient Medications  Medication Sig Dispense Refill  . Calcium Carbonate-Vitamin D (CALCIUM 600+D) 600-400 MG-UNIT tablet Take 2 tablets by mouth daily.    . chlorzoxazone (PARAFON) 500 MG tablet Take 500 mg by mouth 4 (four) times daily. PRN  0  . clonazePAM (KLONOPIN) 1 MG tablet Take 1 mg by mouth at bedtime.    . diclofenac (VOLTAREN) 75 MG EC tablet Take 75 mg by mouth daily.    . diphenhydrAMINE-APAP, sleep, (TYLENOL PM EXTRA STRENGTH PO) Take 1 tablet by mouth at bedtime.    . docusate sodium (COLACE) 100 MG capsule Take 1 capsule (100 mg total) by mouth daily as needed. 30 capsule 2  . estradiol (ESTRACE) 2 MG tablet Take 2 mg by mouth daily.  11  . glimepiride (AMARYL) 1 MG tablet Take 1 mg by mouth daily.    Marland Kitchen lisinopril (PRINIVIL,ZESTRIL) 10 MG tablet Take 10 mg by mouth daily.    . metFORMIN (GLUCOPHAGE) 500 MG tablet Take 500 mg by mouth 2 (two) times daily.    . Multiple Vitamins-Minerals (MULTIVITAMIN ADULTS PO) Take 1 tablet by mouth.    . phentermine (ADIPEX-P) 37.5 MG tablet Take 37.5 mg by mouth daily.  0  . Potassium 99 MG TABS Take 1 tablet by mouth daily at 2 am.    . sertraline (ZOLOFT) 50 MG tablet Take 50 mg by mouth daily.    Marland Kitchen triamcinolone cream (KENALOG) 0.1 % Apply 2 application topically daily as needed.      No current facility-administered medications for this visit.     Review of Systems General: negative for, fevers, chills, fatigue,  changes in sleep, changes in weight or appetite Skin: negative for changes in color, texture, moles or lesions Eyes: negative for, changes in vision, pain, diplopia HEENT: negative for, change in hearing, pain, discharge, tinnitus, vertigo, voice changes, sore throat, neck masses Breasts: negative for breast lumps Pulmonary: negative for, dyspnea, orthopnea, productive cough Cardiac: negative for, palpitations, syncope, pain, discomfort, pressure Gastrointestinal: some constipation Genitourinary/Sexual: negative for, dysuria, discharge, hesitancy, nocturia, retention, stones, infections, STD's, incontinence Ob/Gyn: negative for, irregular bleeding, pain Musculoskeletal: some back pain. Hematology: negative for, easy bruising, bleeding Neurologic/Psych: negative for, headaches, seizures, paralysis, weakness, tremor, change in gait, change in sensation, mood  swings, depression, anxiety, change in memory Some numbness in hands.   Objective:  Physical Examination:  BP 127/79   Pulse 86   Temp 97.6 F (36.4 C) (Tympanic)   Resp 18   Ht 4\' 10"  (1.473 m)   Wt 156 lb 11.2 oz (71.1 kg)   BMI 32.75 kg/m    ECOG Performance Status: 1 - Symptomatic but completely ambulatory  General appearance: alert, cooperative and appears stated age HEENT:neck supple with midline trachea and thyroid without masses Lymph node survey: non-palpable, axillary, inguinal, supraclavicular Cardiovascular: regular rate and rhythm, no murmurs or gallops Respiratory: normal air entry, lungs clear to auscultation and no rales, rhonchi or wheezing Breast exam: not examined. Abdomen: no hernias, tender in RLQ. No masses.  Back: inspection of back is normal Extremities: extremities normal, atraumatic, no cyanosis or edema Skin exam - normal coloration and turgor, no rashes, no suspicious skin lesions noted. Neurological exam reveals alert, oriented, normal speech, no focal findings or movement disorder  noted.  Pelvic: exam chaperoned by nurse;  Vulva: normal appearing vulva with no masses, tenderness or lesions; Vagina: normal vagina; Adnexa: no masses; Cervix normal; Uterus absent: RV confirms.       Assessment:  Haley Jones is a 62 y.o. female diagnosed with small painful mass in RLQ.  She had supracervical hyst and bilateral salpingoophorectomy in 2008 for benign disease at [facility].  Cannot find op note or path report in Epic or through Dr Tennis Must Francesco's office.  This mass could be an ovarian remnant.  The mass has a thick wall and has increased in size with minimal surrounding soft tissue inflammation extending up the mesentery. This may reflect a small bowel loop, in which case it raises concern for lymphoma. A right adnexal cystic lesion is considered less likely, given mesenteric inflammation.    Medical co-morbidities complicating care: Prior C-section.  Plan:   Problem List Items Addressed This Visit    None    Visit Diagnoses    Pelvic mass in female    -  Primary     Scheduled for colonoscopy 02/12/18.  We discussed options for management including diagnostic LS with resection of painful RLQ mass, possible appendectomy, bowel resection, ostomy, possible laparotomy on 7/10 or 7/24. Will coordinate with Dr. Rosana Hoes in general surgery, since this could be ovarian remnant of primary bowel lesion like lymphoma.    The risks of surgery were discussed in detail and she understands these to include infection; wound separation; hernia; vaginal cuff separation, injury to adjacent organs such as bowel, bladder, blood vessels, ureters and nerves; possible permanent ostomy; bleeding which may require blood transfusion; anesthesia risk; thromboembolic events; possible death; unforeseen complications; possible need for re-exploration; medical complications such as heart attack, stroke, pleural effusion and pneumonia; and, if staging performed the risk of lymphedema and lymphocyst; malfunction of  intraperitoneal port if placed.  The patient will receive DVT and antibiotic prophylaxis as indicated.  She voiced a clear understanding.  She had the opportunity to ask questions and written informed consent was obtained today.   The patient's diagnosis, an outline of the further diagnostic and laboratory studies which will be required, the recommendation, and alternatives were discussed.  All questions were answered to the patient's satisfaction.  A total of 60 minutes were spent with the patient/family today; 40% was spent in education, counseling and coordination of care for pelvic mass.    Mellody Drown, MD  CC:  Rusty Aus, MD [place]  [facility] [place], [place] [address] [phone]

## 2018-01-30 NOTE — Progress Notes (Signed)
Patient here today for follow up regarding abdominal mass.  Patient states she has had intermittent cramping since having transvaginal ultrasound.  She states she is stressed out about all that she is going through.  States she cancelled GYN appointment because school was still in and she wanted to wait until after her colonoscopy/endoscopy.

## 2018-01-30 NOTE — H&P (View-Only) (Signed)
[facility] Consult Visit   Referring Provider: Dr Mike Gip  Chief Concern: Painful right lower quadrant mass  Subjective:  Haley Jones is a 62 y.o. P1 female who is seen in consultation from Dr. Sabra Heck for painful RLQ mass.  Presented to [facility] ED 1/19 for abdominal pain after recent treatment for UTI.  CT scan was unrevealing.  SCH/BSO 2008 for benign cysts by Dr Enzo Bi.  CA125 4/19 = 125, CEA = 1.1  CT scan 12/03/17 IMPRESSION: 1. 2.5 cm right adnexal cystic structure, slightly enlarged since  2017. Correlation and follow-up with pelvic ultrasound in 4-6 months is suggested. 2. No acute abdominal findings or lymphadenopathy.  An unusual peripherally enhancing 3.7 cm cystic lesion is noted at the upper right hemipelvis, with a thick wall; this has increased in size since January, with minimal surrounding soft tissue inflammation extending up the mesentery. This may reflect a small bowel loop, in which case it raises concern for lymphoma. A right adnexal cystic lesion is considered less likely, given mesenteric Inflammation.  Saw Dr Rosana Hoes in general surgery 5/19 and he suggested surgery to remove the mass, but she was not ready to proceed at that time.   Pelvic US 01/25/18 IMPRESSION: 1. 3.8 x 2.8 x 3.8 cm complex hypoechoic cystic lesion within the right adnexa, corresponding with lesion seen on prior CTs. Finding is indeterminate, but appears to demonstrate peripheral solid component with associated vascularity. Haley referral for further workup and evaluation recommended. Additionally, further assessment with dedicated pelvic MRI, with and without contrast, suggested for further evaluation. 2. Status post hysterectomy and bilateral oophorectomy. No other pelvic or adnexal mass.  She continues to have pain localized to the RLQ. No GI/GU complaints, except some constipation.  Problem List: Patient Active Problem List   Diagnosis Date Noted  .  Right lower quadrant abdominal mass 12/06/2017  . Weight loss 12/06/2017  . Hoarse 12/06/2017  . Lumbar disc disease 07/26/2017  . Diabetes mellitus type 2, controlled, without complications ([place]) 40/98/1191  . 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 Adnexal mass   . Anxiety   . Depression   . Diabetes mellitus without complication ([place])   . Hoarse 12/06/2017  . Hypertension   . Weight loss 12/06/2017    Past Surgical History: Past Surgical History:  Procedure Laterality Date  . ABDOMINAL HYSTERECTOMY  2008  . CESAREAN SECTION    . EYE SURGERY    . ROTATOR CUFF REPAIR Left 2011      Family History: Family History  Problem Relation Age of Onset  . Melanoma Mother   . Lung cancer Father   . Ovarian cancer Maternal Grandmother     Social History: Social History   Socioeconomic History  . Marital status: Single    Spouse name: Not on file  . Number of children: Not on file  . Years of education: Not on file  . Highest education level: Not on file  Occupational History  . Not on file  Social Needs  . Financial resource strain: Not on file  . Food insecurity:    Worry: Not on file    Inability: Not on file  . Transportation needs:    Medical: Not on file    Non-medical: Not on file  Tobacco Use  . Smoking status: Never Smoker  . Smokeless tobacco: Never Used  Substance and Sexual Activity  . Alcohol use: No  . Drug use: No  .  Sexual activity: Yes  Lifestyle  . Physical activity:    Days per week: Not on file    Minutes per session: Not on file  . Stress: Not on file  Relationships  . Social connections:    Talks on phone: Not on file    Gets together: Not on file    Attends religious service: Not on file    Active member of club or organization: Not on file    Attends meetings of clubs or organizations: Not on file    Relationship status: Not on file  . Intimate partner violence:    Fear of  current or ex partner: Not on file    Emotionally abused: Not on file    Physically abused: Not on file    Forced sexual activity: Not on file  Other Topics Concern  . Not on file  Social History Narrative  . Not on file    Allergies: Allergies  Allergen Reactions  . Etodolac Other (See Comments)    Made her feel "loopy"  . Hydrocodone-Acetaminophen Nausea Only and Nausea And Vomiting  . Orphenadrine Citrate Other (See Comments)    Made her feel "loopy"  . Penicillins Rash    Current Medications: Current Outpatient Medications  Medication Sig Dispense Refill  . Calcium Carbonate-Vitamin D (CALCIUM 600+D) 600-400 MG-UNIT tablet Take 2 tablets by mouth daily.    . chlorzoxazone (PARAFON) 500 MG tablet Take 500 mg by mouth 4 (four) times daily. PRN  0  . clonazePAM (KLONOPIN) 1 MG tablet Take 1 mg by mouth at bedtime.    . diclofenac (VOLTAREN) 75 MG EC tablet Take 75 mg by mouth daily.    . diphenhydrAMINE-APAP, sleep, (TYLENOL PM EXTRA STRENGTH PO) Take 1 tablet by mouth at bedtime.    . docusate sodium (COLACE) 100 MG capsule Take 1 capsule (100 mg total) by mouth daily as needed. 30 capsule 2  . estradiol (ESTRACE) 2 MG tablet Take 2 mg by mouth daily.  11  . glimepiride (AMARYL) 1 MG tablet Take 1 mg by mouth daily.    Marland Kitchen lisinopril (PRINIVIL,ZESTRIL) 10 MG tablet Take 10 mg by mouth daily.    . metFORMIN (GLUCOPHAGE) 500 MG tablet Take 500 mg by mouth 2 (two) times daily.    . Multiple Vitamins-Minerals (MULTIVITAMIN ADULTS PO) Take 1 tablet by mouth.    . phentermine (ADIPEX-P) 37.5 MG tablet Take 37.5 mg by mouth daily.  0  . Potassium 99 MG TABS Take 1 tablet by mouth daily at 2 am.    . sertraline (ZOLOFT) 50 MG tablet Take 50 mg by mouth daily.    Marland Kitchen triamcinolone cream (KENALOG) 0.1 % Apply 2 application topically daily as needed.      No current facility-administered medications for this visit.     Review of Systems General: negative for, fevers, chills, fatigue,  changes in sleep, changes in weight or appetite Skin: negative for changes in color, texture, moles or lesions Eyes: negative for, changes in vision, pain, diplopia HEENT: negative for, change in hearing, pain, discharge, tinnitus, vertigo, voice changes, sore throat, neck masses Breasts: negative for breast lumps Pulmonary: negative for, dyspnea, orthopnea, productive cough Cardiac: negative for, palpitations, syncope, pain, discomfort, pressure Gastrointestinal: some constipation Genitourinary/Sexual: negative for, dysuria, discharge, hesitancy, nocturia, retention, stones, infections, STD's, incontinence Ob/Gyn: negative for, irregular bleeding, pain Musculoskeletal: some back pain. Hematology: negative for, easy bruising, bleeding Neurologic/Psych: negative for, headaches, seizures, paralysis, weakness, tremor, change in gait, change in sensation, mood  swings, depression, anxiety, change in memory Some numbness in hands.   Objective:  Physical Examination:  BP 127/79   Pulse 86   Temp 97.6 F (36.4 C) (Tympanic)   Resp 18   Ht 4\' 10"  (1.473 m)   Wt 156 lb 11.2 oz (71.1 kg)   BMI 32.75 kg/m    ECOG Performance Status: 1 - Symptomatic but completely ambulatory  General appearance: alert, cooperative and appears stated age HEENT:neck supple with midline trachea and thyroid without masses Lymph node survey: non-palpable, axillary, inguinal, supraclavicular Cardiovascular: regular rate and rhythm, no murmurs or gallops Respiratory: normal air entry, lungs clear to auscultation and no rales, rhonchi or wheezing Breast exam: not examined. Abdomen: no hernias, tender in RLQ. No masses.  Back: inspection of back is normal Extremities: extremities normal, atraumatic, no cyanosis or edema Skin exam - normal coloration and turgor, no rashes, no suspicious skin lesions noted. Neurological exam reveals alert, oriented, normal speech, no focal findings or movement disorder  noted.  Pelvic: exam chaperoned by nurse;  Vulva: normal appearing vulva with no masses, tenderness or lesions; Vagina: normal vagina; Adnexa: no masses; Cervix normal; Uterus absent: RV confirms.       Assessment:  Munirah Doerner is a 62 y.o. female diagnosed with small painful mass in RLQ.  She had supracervical hyst and bilateral salpingoophorectomy in 2008 for benign disease at [facility].  Cannot find op note or path report in Epic or through Dr Tennis Must Francesco's office.  This mass could be an ovarian remnant.  The mass has a thick wall and has increased in size with minimal surrounding soft tissue inflammation extending up the mesentery. This may reflect a small bowel loop, in which case it raises concern for lymphoma. A right adnexal cystic lesion is considered less likely, given mesenteric inflammation.    Medical co-morbidities complicating care: Prior C-section.  Plan:   Problem List Items Addressed This Visit    None    Visit Diagnoses    Pelvic mass in female    -  Primary     Scheduled for colonoscopy 02/12/18.  We discussed options for management including diagnostic LS with resection of painful RLQ mass, possible appendectomy, bowel resection, ostomy, possible laparotomy on 7/10 or 7/24. Will coordinate with Dr. Rosana Hoes in general surgery, since this could be ovarian remnant of primary bowel lesion like lymphoma.    The risks of surgery were discussed in detail and she understands these to include infection; wound separation; hernia; vaginal cuff separation, injury to adjacent organs such as bowel, bladder, blood vessels, ureters and nerves; possible permanent ostomy; bleeding which may require blood transfusion; anesthesia risk; thromboembolic events; possible death; unforeseen complications; possible need for re-exploration; medical complications such as heart attack, stroke, pleural effusion and pneumonia; and, if staging performed the risk of lymphedema and lymphocyst; malfunction of  intraperitoneal port if placed.  The patient will receive DVT and antibiotic prophylaxis as indicated.  She voiced a clear understanding.  She had the opportunity to ask questions and written informed consent was obtained today.   The patient's diagnosis, an outline of the further diagnostic and laboratory studies which will be required, the recommendation, and alternatives were discussed.  All questions were answered to the patient's satisfaction.  A total of 60 minutes were spent with the patient/family today; 40% was spent in education, counseling and coordination of care for pelvic mass.    Mellody Drown, MD  CC:  Rusty Aus, MD [place]  [facility] [place], [place] [address] [phone]

## 2018-01-30 NOTE — Progress Notes (Signed)
[facility] day:  01/30/2018    Chief Complaint: Haley Jones is a 62 y.o. female with abdominal pain and a right upper hemipelvis cystic lesion who is seen for 1 month assessment.  HPI:  The patient was last seen in the medical oncology clinic on 12/07/2017.  She had intermittent chronic RLQ abdominal pain.  She denied any melena or hematochezia.  Exam was stable.  She was referred to Dr Tama High, surgeon.  She was referred to Dr. Gustavo Lah for reassessment of Barrett's and chronic constipation.  She was seen by Dr. Tama High on 12/17/2017.  She was felt to have a right pelvic/adnexal cystic structure vs hollow viscus (such as Meckel's diverticulum despite non-opacification with enteric contrast, complicated by co-morbidities including obesity (BMI 32), DM, HTN, generalized anxiety disorder, and major depression disorder.  She was to maintain hydration + high-fiber diet.  She was to take Colace stool softener BID + magnesium citrate daily until BM's.  He discussed surgical intervention as her diagnostic study vs further workup as long as her pain does not return/worsen meanwhile.  She did not want to be "cut on".  She was scheduled for 3 week follow-up.  She was seen by Stephens November, NP on 01/01/2018.  Protonix restarted.  She is scheduled for EGD on 02/12/2018.  GYN oncology evaluation was also recommended.  Pelvic ultrasound on 01/25/2018 revealed a 3.8 x 2.8 x 3.8 cm complex hypoechoic cystic lesion within the right adnexa, corresponding with lesion seen on prior CTs. Finding was indeterminate, but appeared to demonstrate peripheral solid component with associated vascularity.  She was scheduled for GYN-oncology consultation.  She canceled the initial appointment citing that she wanted to get her EGD and colonoscopy taken care of first. We received a call from Stephens November, NP yesterday using that the patient be encouraged to be  seen sooner. GYN oncology was consulted and the patient will be scheduled for 1:00 pm today if she is amenable to the evaluation.   During the interim, patient has been having "the same old pain" in her RIGHT side as she has been having since January. She notes that her pelvic pain has increased overall since her recent ultrasound. She endorses diffuse menstrual like pain that is worse on the RIGHT side. Patient is using a heating pad on her lower abdomen that "helps some".   Patient denies that she has experienced any B symptoms. She denies any interval infections. Patient denies bleeding; no hematochezia, melena, or gross hematuria. Patient maintains an adequate appetite, and notes that she is eating well. Weight, compared to her last visit to the clinic, has increased by 4 pounds.   Patient denies significant pain in the clinic today. Self rates pain at a 0/10.    Past Medical History:  Diagnosis Date  . Adnexal mass   . Anxiety   . Depression   . Diabetes mellitus without complication ([place])   . Hoarse 12/06/2017  . Hypertension   . Weight loss 12/06/2017    Past Surgical History:  Procedure Laterality Date  . ABDOMINAL HYSTERECTOMY  2008  . CESAREAN SECTION    . EYE SURGERY    . ROTATOR CUFF REPAIR Left 2011    Family History  Problem Relation Age of Onset  . Melanoma Mother   . Lung cancer Father   . Ovarian cancer Maternal Grandmother     Social History:  reports that she has never smoked. She has never used  smokeless tobacco. She reports that she does not drink alcohol or use drugs.  She denies any exposure to radiation or toxins.  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  She lives in [place].  She got married yesterday (12/06/2017).  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  The patient is accompanied by her husband, Hank, today.  Allergies:  Allergies  Allergen Reactions  . Etodolac Other (See Comments)    Made her feel "loopy"  . Hydrocodone-Acetaminophen  Nausea Only and Nausea And Vomiting  . Orphenadrine Citrate Other (See Comments)    Made her feel "loopy"  . Penicillins Rash    Current Medications: Current Outpatient Medications  Medication Sig Dispense Refill  . chlorzoxazone (PARAFON) 500 MG tablet Take 500 mg by mouth 4 (four) times daily. PRN  0  . clonazePAM (KLONOPIN) 1 MG tablet Take 1 mg by mouth at bedtime.    . diclofenac (VOLTAREN) 75 MG EC tablet Take 75 mg by mouth daily.    . diphenhydrAMINE-APAP, sleep, (TYLENOL PM EXTRA STRENGTH PO) Take 1 tablet by mouth at bedtime.    . docusate sodium (COLACE) 100 MG capsule Take 1 capsule (100 mg total) by mouth daily as needed. 30 capsule 2  . estradiol (ESTRACE) 2 MG tablet Take 2 mg by mouth daily.  11  . glimepiride (AMARYL) 1 MG tablet Take 1 mg by mouth daily.    Marland Kitchen lisinopril (PRINIVIL,ZESTRIL) 10 MG tablet Take 10 mg by mouth daily.    . metFORMIN (GLUCOPHAGE) 500 MG tablet Take 500 mg by mouth 2 (two) times daily.    . phentermine (ADIPEX-P) 37.5 MG tablet Take 37.5 mg by mouth daily.  0  . sertraline (ZOLOFT) 50 MG tablet Take 50 mg by mouth daily.    Marland Kitchen triamcinolone cream (KENALOG) 0.1 % Apply 2 application topically daily as needed.     . Calcium Carbonate-Vitamin D (CALCIUM 600+D) 600-400 MG-UNIT tablet Take 2 tablets by mouth daily.    . Multiple Vitamins-Minerals (MULTIVITAMIN ADULTS PO) Take 1 tablet by mouth.    . Potassium 99 MG TABS Take 1 tablet by mouth daily at 2 am.     No current facility-administered medications for this visit.     Review of Systems  Constitutional: Negative for diaphoresis, fever, malaise/fatigue and weight loss (weight up 4 pounds).       "I feel ok. I am just nervous".   HENT: Negative for nosebleeds and sore throat.        Rhinorrhea (chronic). Hoarse voice quality "all of my life"  Eyes: Negative.   Respiratory: Negative for cough, hemoptysis, sputum production and shortness of breath.   Cardiovascular: Negative for chest pain,  palpitations, orthopnea, leg swelling and PND.  Gastrointestinal: Positive for abdominal pain (RLQ) and heartburn (on PPI therapy). Negative for blood in stool, constipation, diarrhea, melena, nausea and vomiting.  Genitourinary: Negative for dysuria, frequency, hematuria and urgency.       Pelvic pain s/p recent ultrasound  Musculoskeletal: Positive for back pain (Herniated disc) and joint pain (shoulder arthropathy). Negative for falls and myalgias.       Scoliosis  Skin: Negative for itching and rash.  Neurological: Negative for dizziness, tremors, weakness and headaches.       Chronic balance issues  Endo/Heme/Allergies: Does not bruise/bleed easily.  Psychiatric/Behavioral: Negative for depression, memory loss and suicidal ideas. The patient is nervous/anxious (r/t pressing issue of urgent GYN consult). The patient does not have insomnia.   All other systems  reviewed and are negative.  Performance status (ECOG): 1 - Symptomatic but completely ambulatory   Physical Exam: Blood pressure 123/82, pulse 84, temperature 97.7 F (36.5 C), temperature source Tympanic, resp. rate 18, weight 156 lb 12 oz (71.1 kg). GENERAL:  Well developed, well nourished, woman sitting comfortably in the exam room in no acute distress. MENTAL STATUS:  Alert and oriented to person, place and time. HEAD:  Shoulder length gray hair.  Normocephalic, atraumatic, face symmetric, no Cushingoid features. EYES:  Glasses.  Blue eyes.  Pupils equal round and reactive to light and accomodation.  No conjunctivitis or scleral icterus. ENT:  Hoarse (chronic).  Oropharynx clear without lesion.  Tongue normal. Mucous membranes moist.  RESPIRATORY:  Clear to auscultation without rales, wheezes or rhonchi. CARDIOVASCULAR:  Regular rate and rhythm without murmur, rub or gallop. ABDOMEN:  Soft, non-tender, with active bowel sounds, and no hepatosplenomegaly.  No masses. SKIN:  No rashes, ulcers or lesions. EXTREMITIES: No edema,  no skin discoloration or tenderness.  No palpable cords. LYMPH NODES: No palpable cervical, supraclavicular, axillary or inguinal adenopathy  NEUROLOGICAL: Unremarkable. PSYCH:  Appropriate.    Appointment on 01/30/2018  Component Date Value Ref Range Status  . Sodium 01/30/2018 137  135 - 145 mmol/L Final  . Potassium 01/30/2018 4.6  3.5 - 5.1 mmol/L Final  . Chloride 01/30/2018 101  101 - 111 mmol/L Final  . CO2 01/30/2018 25  22 - 32 mmol/L Final  . Glucose, Bld 01/30/2018 88  65 - 99 mg/dL Final  . BUN 01/30/2018 18  6 - 20 mg/dL Final  . Creatinine, Ser 01/30/2018 0.86  0.44 - 1.00 mg/dL Final  . Calcium 01/30/2018 8.8* 8.9 - 10.3 mg/dL Final  . Total Protein 01/30/2018 7.0  6.5 - 8.1 g/dL Final  . Albumin 01/30/2018 3.6  3.5 - 5.0 g/dL Final  . AST 01/30/2018 19  15 - 41 U/L Final  . ALT 01/30/2018 15  14 - 54 U/L Final  . Alkaline Phosphatase 01/30/2018 55  38 - 126 U/L Final  . Total Bilirubin 01/30/2018 0.6  0.3 - 1.2 mg/dL Final  . GFR calc non Af Amer 01/30/2018 >60  >60 mL/min Final  . GFR calc Af Amer 01/30/2018 >60  >60 mL/min Final   Comment: (NOTE) The eGFR has been calculated using the CKD EPI equation. This calculation has not been validated in all clinical situations. eGFR's persistently <60 mL/min signify possible Chronic Kidney Disease.   Georgiann Hahn gap 01/30/2018 11  5 - 15 Final   Performed at [facility], [address], [place], [place] [address]  . WBC 01/30/2018 4.6  3.6 - 11.0 K/uL Final  . RBC 01/30/2018 3.31* 3.80 - 5.20 MIL/uL Final  . Hemoglobin 01/30/2018 10.8* 12.0 - 16.0 g/dL Final  . HCT 01/30/2018 31.7* 35.0 - 47.0 % Final  . MCV 01/30/2018 95.6  80.0 - 100.0 fL Final  . MCH 01/30/2018 32.6  26.0 - 34.0 pg Final  . MCHC 01/30/2018 34.1  32.0 - 36.0 g/dL Final  . RDW 01/30/2018 12.7  11.5 - 14.5 % Final  . Platelets 01/30/2018 209  150 - 440 K/uL Final  . Neutrophils Relative % 01/30/2018 59  % Final  . Neutro Abs 01/30/2018 2.8   1.4 - 6.5 K/uL Final  . Lymphocytes Relative 01/30/2018 30  % Final  . Lymphs Abs 01/30/2018 1.4  1.0 - 3.6 K/uL Final  . Monocytes Relative 01/30/2018 7  % Final  . Monocytes  Absolute 01/30/2018 0.3  0.2 - 0.9 K/uL Final  . Eosinophils Relative 01/30/2018 3  % Final  . Eosinophils Absolute 01/30/2018 0.2  0 - 0.7 K/uL Final  . Basophils Relative 01/30/2018 1  % Final  . Basophils Absolute 01/30/2018 0.0  0 - 0.1 K/uL Final   Performed at [facility], [address] [place], [place] [address]    Assessment:  Sheyenne Konz is a 62 y.o. female with a lesion in the right hemipelvis of unclear etiology.  She has a 3 month history of RLQ pain.  She has lost 30-35 pounds in the past 6 months intentionally.  CA125 was 125.9 and CEA 1.1 on 11/28/2017.  Abdomen and pelvic CT on 08/15/2017 revealed no acute intra-abdominal or intrapelvic abnormalities.  There was degenerative disc and facet disease changes of the lower lumbar spine with neural foraminal stenoses at L4-L5 and L5-S1 with question of a calcified disc fragment posterior to the L5 vertebral body on RIGHT.   Abdominal and pelvic CT on 11/28/2017 revealed an unusual thick-walled peripherally enhancing 3.7 cm cystic lesion at the right upper hemipelvis. This had increased in size since 08/2017, with minimal surrounding soft tissue inflammation. This may reflect a small bowel loop, in which case it raises concern for small bowel lymphoma.  A right adnexal cystic lesion was considered less likely.  Colon was largely filled with stool, raising concern for mild constipation.  Abdomen and pelvis CT on 12/03/2017 revealed a 2.5 cm right adnexal cystic structure, slightly enlarged since 2017.  There were no acute abdominal findings or lymphadenopathy.  Pelvic ultrasound on 01/25/2018 revealed a 3.8 x 2.8 x 3.8 cm complex hypoechoic cystic lesion within the right adnexa, corresponding with lesion seen on prior CTs. Finding was  indeterminate, but appeared to demonstrate peripheral solid component with associated vascularity.  She is s/p TAH/BSO in 2008 for fibroids and menorrhagia.  She had an EGD and colonoscopy in her late 30s for reflux.  She denied any bleeding.  She is unsure about a diagnosis of Barrett's esophagus.  She is no longer taking Nexium.  She previously was seen in the GI [facility].  Symptomatically, patient is doing well overall. She has had recurrent pelvic pain since her recent ultrasound. She denies any melena or hematochezia.  Exam is stable.  Plan: 1. Labs today:  CBC with diff, CMP, CA125. 2. Discuss interval surgery and GI appointments. 3. Discuss importance of follow-up with GYN-oncology for evaluation of RIGHT adnexal multicystic structure. Ultrasound demonstrated a peripheral solid component and associated vascularity.  Patient amenable to being seen in the GYN clinic today by Dr. Theora Gianotti.  Appointment secured for 1300.  4. Anticipate EGD and colonoscopy on 02/12/2018 per patient. 5. RTC in 6 weeks for MD assessment.   Honor Loh, NP  01/30/2018, 1:30 PM   I saw and evaluated the patient, participating in the key portions of the service and reviewing pertinent diagnostic studies and records.  I reviewed the nurse practitioner's note and agree with the findings and the plan.  The assessment and plan were discussed with the patient.  Several questions were asked by the patient and answered.   Nolon Stalls, MD 01/30/2018,1:30 PM

## 2018-01-30 NOTE — Patient Instructions (Signed)
Diagnostic Laparoscopy A diagnostic laparoscopy is a procedure to diagnose diseases in the abdomen. During the procedure, a thin, lighted, pencil-sized instrument called a laparoscope is inserted into the abdomen through an incision. The laparoscope allows your health care provider to look at the organs inside your body. Tell a health care provider about:  Any allergies you have.  All medicines you are taking, including vitamins, herbs, eye drops, creams, and over-the-counter medicines.  Any problems you or family members have had with anesthetic medicines.  Any blood disorders you have.  Any surgeries you have had.  Any medical conditions you have. What are the risks? Generally, this is a safe procedure. However, problems can occur, which may include:  Infection.  Bleeding.  Damage to other organs.  Allergic reaction to the anesthetics used during the procedure.  What happens before the procedure?  Do not eat or drink anything after midnight on the night before the procedure or as directed by your health care provider.  Ask your health care provider about: ? Changing or stopping your regular medicines. ? Taking medicines such as aspirin and ibuprofen. These medicines can thin your blood. Do not take these medicines before your procedure if your health care provider instructs you not to.  Plan to have someone take you home after the procedure. What happens during the procedure?  You may be given a medicine to help you relax (sedative).  You will be given a medicine to make you sleep (general anesthetic).  Your abdomen will be inflated with a gas. This will make your organs easier to see.  Small incisions will be made in your abdomen.  A laparoscope and other small instruments will be inserted into the abdomen through the incisions.  A tissue sample may be removed from an organ in the abdomen for examination.  The instruments will be removed from the abdomen.  The  gas will be released.  The incisions will be closed with stitches (sutures). What happens after the procedure? Your blood pressure, heart rate, breathing rate, and blood oxygen level will be monitored often until the medicines you were given have worn off. This information is not intended to replace advice given to you by your health care provider. Make sure you discuss any questions you have with your health care provider. Document Released: 11/06/2000 Document Revised: 12/09/2015 Document Reviewed: 03/13/2014 Elsevier Interactive Patient Education  2018 [employer]. Diagnostic Laparoscopy, Care After Refer to this sheet in the next few weeks. These instructions provide you with information about caring for yourself after your procedure. Your health care provider may also give you more specific instructions. Your treatment has been planned according to current medical practices, but problems sometimes occur. Call your health care provider if you have any problems or questions after your procedure. What can I expect after the procedure? After your procedure, it is common to have mild discomfort in the throat and abdomen. Follow these instructions at home:  Take over-the-counter and prescription medicines only as told by your health care provider.  Do not drive for 24 hours if you received a sedative.  Return to your normal activities as told by your health care provider.  Do not take baths, swim, or use a hot tub until your health care provider approves. You may shower.  Follow instructions from your health care provider about how to take care of your incision. Make sure you: ? Wash your hands with soap and water before you change your bandage (dressing). If soap and  water are not available, use hand sanitizer. ? Change your dressing as told by your health care provider. ? Leave stitches (sutures), skin glue, or adhesive strips in place. These skin closures may need to stay in place for 2  weeks or longer. If adhesive strip edges start to loosen and curl up, you may trim the loose edges. Do not remove adhesive strips completely unless your health care provider tells you to do that.  Check your incision area every day for signs of infection. Check for: ? More redness, swelling, or pain. ? More fluid or blood. ? Warmth. ? Pus or a bad smell.  It is your responsibility to get the results of your procedure. Ask your health care provider or the department performing the procedure when your results will be ready. Contact a health care provider if:  There is new pain in your shoulders.  You feel light-headed or faint.  You are unable to pass gas or unable to have a bowel movement.  You feel nauseous or you vomit.  You develop a rash.  You have more redness, swelling, or pain around your incision.  You have more fluid or blood coming from your incision.  Your incision feels warm to the touch.  You have pus or a bad smell coming from your incision.  You have a fever or chills. Get help right away if:  Your pain is getting worse.  You have ongoing vomiting.  The edges of your incision open up.  You have trouble breathing.  You have chest pain. This information is not intended to replace advice given to you by your health care provider. Make sure you discuss any questions you have with your health care provider. Document Released: 07/12/2015 Document Revised: 01/06/2016 Document Reviewed: 04/13/2015 Elsevier Interactive Patient Education  2018 [employer].

## 2018-01-30 NOTE — Progress Notes (Signed)
Pt not having issues right now but sometimes she has right sided pelvic pain

## 2018-01-31 LAB — CA 125: Cancer Antigen (CA) 125: 68.1 U/mL — ABNORMAL HIGH (ref 0.0–38.1)

## 2018-01-31 NOTE — Progress Notes (Signed)
Surgery has been scheduled for 02/20/2018. Surgery booking request faxed to OR scheduling with confirmation of receipt. Copy of consent faxed to PAT with confirmation of receipt. Original consent sent to medical records for filing. Ms. Hottenstein notified of surgery date. We will call her with PAT appointment once it is arranged. Oncology Nurse Navigator Documentation  Navigator Location: [facility] (01/31/18 1400)   )Navigator Encounter Type: Telephone (01/31/18 1400) Telephone: [place] Call (01/31/18 1400)                                                  Time Spent with Patient: 30 (01/31/18 1400)

## 2018-02-11 DIAGNOSIS — R011 Cardiac murmur, unspecified: Secondary | ICD-10-CM

## 2018-02-11 HISTORY — DX: Cardiac murmur, unspecified: R01.1

## 2018-02-11 MED ORDER — METRONIDAZOLE IN NACL 5-0.79 MG/ML-% IV SOLN
500.0000 mg | INTRAVENOUS | Status: DC
  Filled 2018-02-11: qty 100

## 2018-02-12 ENCOUNTER — Encounter: Payer: Self-pay | Admitting: *Deleted

## 2018-02-12 ENCOUNTER — Encounter
Admission: RE | Disposition: A | Discharge status: Home / Self Care | Payer: Self-pay | Source: Ambulatory Visit | Attending: Internal Medicine

## 2018-02-12 ENCOUNTER — Ambulatory Visit: Payer: BC Managed Care – PPO | Admitting: Anesthesiology

## 2018-02-12 ENCOUNTER — Other Ambulatory Visit: Payer: Self-pay

## 2018-02-12 ENCOUNTER — Ambulatory Visit
Admission: RE | Admit: 2018-02-12 | Discharge: 2018-02-12 | Disposition: A | Discharge status: Home / Self Care | Payer: BC Managed Care – PPO | Source: Ambulatory Visit | Attending: Internal Medicine | Admitting: Internal Medicine

## 2018-02-12 DIAGNOSIS — F419 Anxiety disorder, unspecified: Secondary | ICD-10-CM | POA: Insufficient documentation

## 2018-02-12 DIAGNOSIS — R1900 Intra-abdominal and pelvic swelling, mass and lump, unspecified site: Secondary | ICD-10-CM

## 2018-02-12 DIAGNOSIS — Z885 Allergy status to narcotic agent status: Secondary | ICD-10-CM | POA: Insufficient documentation

## 2018-02-12 DIAGNOSIS — Z880 Allergy status to penicillin: Secondary | ICD-10-CM | POA: Diagnosis not present

## 2018-02-12 DIAGNOSIS — R194 Change in bowel habit: Secondary | ICD-10-CM | POA: Diagnosis not present

## 2018-02-12 DIAGNOSIS — F329 Major depressive disorder, single episode, unspecified: Secondary | ICD-10-CM | POA: Diagnosis not present

## 2018-02-12 DIAGNOSIS — Z7984 Long term (current) use of oral hypoglycemic drugs: Secondary | ICD-10-CM | POA: Diagnosis not present

## 2018-02-12 DIAGNOSIS — Z888 Allergy status to other drugs, medicaments and biological substances status: Secondary | ICD-10-CM | POA: Diagnosis not present

## 2018-02-12 DIAGNOSIS — K640 First degree hemorrhoids: Secondary | ICD-10-CM | POA: Insufficient documentation

## 2018-02-12 DIAGNOSIS — K210 Gastro-esophageal reflux disease with esophagitis: Secondary | ICD-10-CM | POA: Diagnosis not present

## 2018-02-12 DIAGNOSIS — K2270 Barrett's esophagus without dysplasia: Secondary | ICD-10-CM | POA: Insufficient documentation

## 2018-02-12 DIAGNOSIS — E119 Type 2 diabetes mellitus without complications: Secondary | ICD-10-CM | POA: Diagnosis not present

## 2018-02-12 DIAGNOSIS — I10 Essential (primary) hypertension: Secondary | ICD-10-CM | POA: Insufficient documentation

## 2018-02-12 DIAGNOSIS — Z79899 Other long term (current) drug therapy: Secondary | ICD-10-CM | POA: Diagnosis not present

## 2018-02-12 DIAGNOSIS — R1031 Right lower quadrant pain: Secondary | ICD-10-CM | POA: Diagnosis not present

## 2018-02-12 HISTORY — DX: Dorsalgia, unspecified: M54.9

## 2018-02-12 HISTORY — DX: Other chronic pain: G89.29

## 2018-02-12 HISTORY — PX: COLONOSCOPY WITH PROPOFOL: SHX5780

## 2018-02-12 HISTORY — PX: ESOPHAGOGASTRODUODENOSCOPY (EGD) WITH PROPOFOL: SHX5813

## 2018-02-12 LAB — GLUCOSE, CAPILLARY: GLUCOSE-CAPILLARY: 105 mg/dL — AB (ref 70–99)

## 2018-02-12 SURGERY — ESOPHAGOGASTRODUODENOSCOPY (EGD) WITH PROPOFOL
Anesthesia: General

## 2018-02-12 MED ORDER — LIDOCAINE HCL (PF) 2 % IJ SOLN
INTRAMUSCULAR | Status: AC
  Filled 2018-02-12: qty 10

## 2018-02-12 MED ORDER — PHENYLEPHRINE HCL 10 MG/ML IJ SOLN
INTRAMUSCULAR | Status: AC
  Filled 2018-02-12: qty 1

## 2018-02-12 MED ORDER — GLYCOPYRROLATE 0.2 MG/ML IJ SOLN
INTRAMUSCULAR | Status: AC
  Filled 2018-02-12: qty 1

## 2018-02-12 MED ORDER — FENTANYL CITRATE (PF) 100 MCG/2ML IJ SOLN
INTRAMUSCULAR | Status: AC
  Filled 2018-02-12: qty 2

## 2018-02-12 MED ORDER — PROPOFOL 500 MG/50ML IV EMUL
INTRAVENOUS | Status: AC
  Filled 2018-02-12: qty 150

## 2018-02-12 MED ORDER — GLYCOPYRROLATE 0.2 MG/ML IJ SOLN
INTRAMUSCULAR | Status: DC | PRN
  Administered 2018-02-12: 0.2 mg via INTRAVENOUS

## 2018-02-12 MED ORDER — PROPOFOL 10 MG/ML IV BOLUS
INTRAVENOUS | Status: DC | PRN
  Administered 2018-02-12: 70 mg via INTRAVENOUS

## 2018-02-12 MED ORDER — SODIUM CHLORIDE 0.9 % IJ SOLN
INTRAMUSCULAR | Status: AC
  Filled 2018-02-12: qty 10

## 2018-02-12 MED ORDER — SODIUM CHLORIDE 0.9 % IV SOLN
INTRAVENOUS | Status: DC
  Administered 2018-02-12: 08:00:00 via INTRAVENOUS

## 2018-02-12 MED ORDER — LIDOCAINE HCL (CARDIAC) PF 100 MG/5ML IV SOSY
PREFILLED_SYRINGE | INTRAVENOUS | Status: DC | PRN
  Administered 2018-02-12: 60 mg via INTRAVENOUS

## 2018-02-12 MED ORDER — SUCCINYLCHOLINE CHLORIDE 20 MG/ML IJ SOLN
INTRAMUSCULAR | Status: AC
Start: 2018-02-12 — End: ?
  Filled 2018-02-12: qty 1

## 2018-02-12 MED ORDER — METRONIDAZOLE IN NACL 5-0.79 MG/ML-% IV SOLN
500.0000 mg | INTRAVENOUS | Status: DC
  Filled 2018-02-12: qty 100

## 2018-02-12 MED ORDER — FENTANYL CITRATE (PF) 250 MCG/5ML IJ SOLN
INTRAMUSCULAR | Status: DC | PRN
  Administered 2018-02-12 (×2): 25 ug via INTRAVENOUS

## 2018-02-12 MED ORDER — EPHEDRINE SULFATE 50 MG/ML IJ SOLN
INTRAMUSCULAR | Status: AC
  Filled 2018-02-12: qty 1

## 2018-02-12 MED ORDER — PROPOFOL 500 MG/50ML IV EMUL
INTRAVENOUS | Status: DC | PRN
  Administered 2018-02-12: 140 ug/kg/min via INTRAVENOUS

## 2018-02-12 MED ORDER — PHENYLEPHRINE HCL 10 MG/ML IJ SOLN
INTRAMUSCULAR | Status: DC | PRN
  Administered 2018-02-12: 100 ug via INTRAVENOUS

## 2018-02-12 MED ORDER — LIDOCAINE HCL (PF) 2 % IJ SOLN
INTRAMUSCULAR | Status: AC
  Filled 2018-02-12: qty 20

## 2018-02-12 NOTE — Transfer of Care (Signed)
Immediate Anesthesia Transfer of Care Note  Patient: Haley Jones  Procedure(s) Performed: ESOPHAGOGASTRODUODENOSCOPY (EGD) WITH PROPOFOL (N/A ) COLONOSCOPY WITH PROPOFOL (N/A )  Patient Location: PACU  Anesthesia Type:General  Level of Consciousness: sedated  Airway & Oxygen Therapy: Patient Spontanous Breathing and Patient connected to nasal cannula oxygen  Post-op Assessment: Report given to RN and Post -op Vital signs reviewed and stable  Post vital signs: Reviewed and stable  Last Vitals:  Vitals Value Taken Time  BP 103/68 02/12/2018  8:40 AM  Temp 36 C 02/12/2018  8:40 AM  Pulse 89 02/12/2018  8:40 AM  Resp 7 02/12/2018  8:40 AM  SpO2 100 % 02/12/2018  8:40 AM  Vitals shown include unvalidated device data.  Last Pain:  Vitals:   02/12/18 0839  TempSrc: Tympanic  PainSc:          Complications: No apparent anesthesia complications

## 2018-02-12 NOTE — Anesthesia Preprocedure Evaluation (Signed)
Anesthesia Evaluation  Patient identified by MRN, date of birth, ID band Patient awake    Reviewed: Allergy & Precautions, H&P , NPO status , Patient's Chart, lab work & pertinent test results, reviewed documented beta blocker date and time   Airway Mallampati: II   Neck ROM: full    Dental  (+) Poor Dentition   Pulmonary neg pulmonary ROS,    Pulmonary exam normal        Cardiovascular Exercise Tolerance: Poor hypertension, On Medications negative cardio ROS Normal cardiovascular exam Rhythm:regular Rate:Normal     Neuro/Psych PSYCHIATRIC DISORDERS Anxiety Depression negative neurological ROS  negative psych ROS   GI/Hepatic negative GI ROS, Neg liver ROS,   Endo/Other  negative endocrine ROSdiabetes  Renal/GU negative Renal ROS  negative genitourinary   Musculoskeletal   Abdominal   Peds  Hematology negative hematology ROS (+)   Anesthesia Other Findings Past Medical History: No date: Adnexal mass No date: Anxiety No date: Chronic back pain No date: Depression No date: Diabetes mellitus without complication ([place]) 11/29/4079: Hoarse No date: Hypertension 12/06/2017: Weight loss Past Surgical History: 2008: ABDOMINAL HYSTERECTOMY No date: CESAREAN SECTION No date: COLONOSCOPY No date: EYE SURGERY 2011: [place]; Left BMI    Body Mass Index:  32.19 kg/m     Reproductive/Obstetrics negative OB ROS                             Anesthesia Physical Anesthesia Plan  ASA: III  Anesthesia Plan: General   Post-op Pain Management:    Induction:   PONV Risk Score and Plan:   Airway Management Planned:   Additional Equipment:   Intra-op Plan:   Post-operative Plan:   Informed Consent: I have reviewed the patients History and Physical, chart, labs and discussed the procedure including the risks, benefits and alternatives for the proposed anesthesia with the  patient or authorized representative who has indicated his/her understanding and acceptance.   Dental Advisory Given  Plan Discussed with: CRNA  Anesthesia Plan Comments:         Anesthesia Quick Evaluation

## 2018-02-12 NOTE — H&P (Signed)
Outpatient short stay form Pre-procedure 02/12/2018 8:06 AM Haley Jones K. Alice Reichert, M.D.  Primary Physician: Emily Filbert, M.D.  Reason for visit:  RLQ pain, constipation, hx Barrett's esophagus.  History of present illness:  Pleasant 62y/o female presents for workup of the above with EGD and colonoscopy. Last colonoscopy and EGD in 2010 per Dr. Gustavo Lah revealed Barrett's esophagus with no dysplasia. She denies hb on current acid suppression. Has chronic RLQ pain accompanying constipation for which she takes dulcolax.    Current Facility-Administered Medications:  .  0.9 %  sodium chloride infusion, , Intravenous, Continuous, [place], Benay Pike, MD, Last Rate: 20 mL/hr at 02/12/18 0733 .  metroNIDAZOLE (FLAGYL) IVPB 500 mg, 500 mg, Intravenous, On Call to OR, Mellody Drown, MD  Medications Prior to Admission  Medication Sig Dispense Refill Last Dose  . Calcium Carbonate-Vitamin D (CALCIUM 600+D) 600-400 MG-UNIT tablet Take 2 tablets by mouth daily.   Past Week at Unknown time  . ciprofloxacin (CIPRO) 500 MG tablet Take 500 mg by mouth 2 (two) times daily.   02/12/2018 at 0550  . clonazePAM (KLONOPIN) 1 MG tablet Take 1 mg by mouth at bedtime.   02/11/2018 at 2200  . diphenhydrAMINE-APAP, sleep, (TYLENOL PM EXTRA STRENGTH PO) Take 2 tablets by mouth at bedtime.    Past Week at Unknown time  . docusate sodium (COLACE) 100 MG capsule Take 1 capsule (100 mg total) by mouth daily as needed. (Patient taking differently: Take 200-300 mg by mouth daily as needed (for constipation.). ) 30 capsule 2 Past Week at Unknown time  . estradiol (ESTRACE) 2 MG tablet Take 2 mg by mouth daily.  11 02/12/2018 at 0550  . glimepiride (AMARYL) 1 MG tablet Take 1 mg by mouth daily.   Past Week at Unknown time  . lisinopril (PRINIVIL,ZESTRIL) 10 MG tablet Take 10 mg by mouth daily.   02/12/2018 at 0550  . metFORMIN (GLUCOPHAGE) 500 MG tablet Take 500-1,000 mg by mouth See admin instructions. Take 1 tablet (500 mg) in the  morning & take 2 tablets (1000 mg) by mouth in the evening.   Past Week at Unknown time  . pantoprazole (PROTONIX) 40 MG tablet Take 40 mg by mouth daily before breakfast.  11 02/12/2018 at 0500  . phentermine (ADIPEX-P) 37.5 MG tablet Take 37.5 mg by mouth daily before breakfast.   0 Past Week at Unknown time  . Potassium 99 MG TABS Take 99 mg by mouth daily.    Past Week at Unknown time  . sertraline (ZOLOFT) 50 MG tablet Take 50 mg by mouth daily.   02/12/2018 at 0550  . triamcinolone cream (KENALOG) 0.1 % Apply 1 application topically 2 (two) times daily as needed (for rash).    Past Month at Unknown time     Allergies  Allergen Reactions  . Etodolac Other (See Comments)    Made her feel "loopy"  . Hydrocodone-Acetaminophen Nausea Only and Nausea And Vomiting  . Orphenadrine Citrate Other (See Comments)    Made her feel "loopy"  . Penicillins Rash    Has patient had a PCN reaction causing immediate rash, facial/tongue/throat swelling, SOB or lightheadedness with hypotension: Unknown Has patient had a PCN reaction causing severe rash involving mucus membranes or skin necrosis: Unknown Has patient had a PCN reaction that required hospitalization: No Has patient had a PCN reaction occurring within the last 10 years: No Childhood reaction If all of the above answers are "NO", then may proceed with Cephalosporin use.  Past Medical History:  Diagnosis Date  . Adnexal mass   . Anxiety   . Chronic back pain   . Depression   . Diabetes mellitus without complication ([place])   . Hoarse 12/06/2017  . Hypertension   . Weight loss 12/06/2017    Review of systems:  Otherwise negative.    Physical Exam  Gen: Alert, oriented. Appears stated age.  HEENT: [place]/AT. PERRLA. Lungs: CTA, no wheezes. CV: RR nl S1, S2. Abd: soft, benign, no masses. BS+ Ext: No edema. Pulses 2+    Planned procedures: Proceed with EGD and colonoscopy. The patient understands the nature of the planned  procedure, indications, risks, alternatives and potential complications including but not limited to bleeding, infection, perforation, damage to internal organs and possible oversedation/side effects from anesthesia. The patient agrees and gives consent to proceed.  Please refer to procedure notes for findings, recommendations and patient disposition/instructions.     Carissa Musick K. Alice Reichert, M.D. Gastroenterology 02/12/2018  8:06 AM

## 2018-02-12 NOTE — Anesthesia Post-op Follow-up Note (Signed)
Anesthesia QCDR form completed.        

## 2018-02-12 NOTE — Interval H&P Note (Signed)
History and Physical Interval Note:  02/12/2018 8:09 AM  Haley Jones  has presented today for surgery, with the diagnosis of HX.BARRETT'S ESOPHAGUS ABDOMINAL PAIN  The various methods of treatment have been discussed with the patient and family. After consideration of risks, benefits and other options for treatment, the patient has consented to  Procedure(s): ESOPHAGOGASTRODUODENOSCOPY (EGD) WITH PROPOFOL (N/A) COLONOSCOPY WITH PROPOFOL (N/A) as a surgical intervention .  The patient's history has been reviewed, patient examined, no change in status, stable for surgery.  I have reviewed the patient's chart and labs.  Questions were answered to the patient's satisfaction.     [place], [place]

## 2018-02-12 NOTE — Op Note (Signed)
[facility] Gastroenterology Patient Name: Haley Jones Procedure Date: 02/12/2018 7:28 AM MRN: [id number] Account #: [id number] Date of Birth: 14-Oct-1955 Admit Type: Outpatient Age: 62 Room: [facility] ENDO ROOM 3 Gender: Female Note Status: Finalized Procedure:            Colonoscopy Indications:          Abdominal pain in the right lower quadrant, Change in                        bowel habits Providers:            Benay Pike. [place] MD, MD Medicines:            Propofol per Anesthesia Complications:        No immediate complications. Procedure:            Pre-Anesthesia Assessment:                       - The risks and benefits of the procedure and the                        sedation options and risks were discussed with the                        patient. All questions were answered and informed                        consent was obtained.                       - Patient identification and proposed procedure were                        verified prior to the procedure by the nurse. The                        procedure was verified in the procedure room.                       - ASA Grade Assessment: III - A patient with severe                        systemic disease.                       - After reviewing the risks and benefits, the patient                        was deemed in satisfactory condition to undergo the                        procedure.                       After obtaining informed consent, the colonoscope was                        passed under direct vision. Throughout the procedure,                        the patient's blood pressure, pulse, and oxygen  saturations were monitored continuously. The                        Colonoscope was introduced through the anus and                        advanced to the the cecum, identified by appendiceal                        orifice and ileocecal valve. The colonoscopy was        performed without difficulty. The patient tolerated the                        procedure well. The quality of the bowel preparation                        was adequate. Findings:      The perianal and digital rectal examinations were normal. Pertinent       negatives include normal sphincter tone and no palpable rectal lesions.      The entire examined colon and colon (entire examined portion) appeared       normal.      Non-bleeding internal hemorrhoids were found during retroflexion. The       hemorrhoids were Grade I (internal hemorrhoids that do not prolapse).      The exam was otherwise without abnormality. Impression:           - The entire examined colon is normal.                       - No specimens collected. Recommendation:       - Patient has a contact number available for                        emergencies. The signs and symptoms of potential                        delayed complications were discussed with the patient.                        Return to normal activities tomorrow. Written discharge                        instructions were provided to the patient.                       - Resume previous diet.                       - Continue present medications.                       - Repeat colonoscopy in 10 years for screening purposes.                       - Await pathology results from EGD, also performed                        today.                       - Return to nurse  practitioner in 6 months.                       - The findings and recommendations were discussed with                        the patient and their spouse. Procedure Code(s):    --- Professional ---                       [phone], Colonoscopy, flexible; diagnostic, including                        collection of specimen(s) by brushing or washing, when                        performed (separate procedure) Diagnosis Code(s):    --- Professional ---                       R10.31, Right lower quadrant  pain                       R19.4, Change in bowel habit CPT copyright 2017 American Medical Association. All rights reserved. The codes documented in this report are preliminary and upon coder review may  be revised to meet current compliance requirements. Efrain Sella MD, MD 02/12/2018 8:38:32 AM This report has been signed electronically. Number of Addenda: 0 Note Initiated On: 02/12/2018 7:28 AM Scope Withdrawal Time: 0 hours 8 minutes 41 seconds  Total Procedure Duration: 0 hours 13 minutes 51 seconds       [facility]

## 2018-02-12 NOTE — Anesthesia Postprocedure Evaluation (Signed)
Anesthesia Post Note  Patient: Annalucia Laino  Procedure(s) Performed: ESOPHAGOGASTRODUODENOSCOPY (EGD) WITH PROPOFOL (N/A ) COLONOSCOPY WITH PROPOFOL (N/A )  Patient location during evaluation: PACU Anesthesia Type: General Level of consciousness: awake and alert Pain management: pain level controlled Vital Signs Assessment: post-procedure vital signs reviewed and stable Respiratory status: spontaneous breathing, nonlabored ventilation, respiratory function stable and patient connected to nasal cannula oxygen Cardiovascular status: blood pressure returned to baseline and stable Postop Assessment: no apparent nausea or vomiting Anesthetic complications: no     Last Vitals:  Vitals:   02/12/18 0859 02/12/18 0909  BP: 110/70 118/70  Pulse: 91 90  Resp: 16 18  Temp:    SpO2: 100% 100%    Last Pain:  Vitals:   02/12/18 0909  TempSrc:   PainSc: 0-No pain                 Molli Barrows

## 2018-02-12 NOTE — Op Note (Signed)
[facility] Gastroenterology Patient Name: Haley Jones Procedure Date: 02/12/2018 7:25 AM MRN: [id number] Account #: [id number] Date of Birth: 01/03/1956 Admit Type: Outpatient Age: 62 Room: [facility] ENDO ROOM 3 Gender: Female Note Status: Finalized Procedure:            Upper GI endoscopy Indications:          Screening for Barrett's esophagus, Esophageal reflux Providers:            Benay Pike. Alice Reichert MD, MD Referring MD:         Rusty Aus, MD (Referring MD) Medicines:            Propofol per Anesthesia Complications:        No immediate complications. Procedure:            Pre-Anesthesia Assessment:                       - The risks and benefits of the procedure and the                        sedation options and risks were discussed with the                        patient. All questions were answered and informed                        consent was obtained.                       - Patient identification and proposed procedure were                        verified prior to the procedure by the nurse. The                        procedure was verified in the procedure room.                       - ASA Grade Assessment: III - A patient with severe                        systemic disease.                       - After reviewing the risks and benefits, the patient                        was deemed in satisfactory condition to undergo the                        procedure.                       After obtaining informed consent, the endoscope was                        passed under direct vision. Throughout the procedure,                        the patient's blood pressure, pulse, and oxygen  saturations were monitored continuously. The Endoscope                        was introduced through the mouth, and advanced to the                        third part of duodenum. The upper GI endoscopy was                        accomplished without  difficulty. The patient tolerated                        the procedure well. Findings:      There were esophageal mucosal changes secondary to established       short-segment Barrett's disease present in the distal esophagus. The       maximum longitudinal extent of these mucosal changes was 1 cm in length.       Mucosa was biopsied with a cold forceps for histology in 4 quadrants at       intervals of 1 cm at the gastroesophageal junction. One specimen bottle       was sent to pathology.      The entire examined stomach was normal.      The cardia and gastric fundus were normal on retroflexion.      The examined duodenum was normal. Impression:           - Esophageal mucosal changes secondary to established                        short-segment Barrett's disease. Biopsied.                       - Normal stomach.                       - Normal examined duodenum. Recommendation:       - Await pathology results.                       - Repeat upper endoscopy after studies are complete for                        surveillance.                       - Proceed with colonoscopy Procedure Code(s):    --- Professional ---                       [phone], Esophagogastroduodenoscopy, flexible, transoral;                        with biopsy, single or multiple Diagnosis Code(s):    --- Professional ---                       K21.9, Gastro-esophageal reflux disease without                        esophagitis                       Z13.810, Encounter for screening for upper  gastrointestinal disorder                       K22.70, Barrett's esophagus without dysplasia CPT copyright 2017 American Medical Association. All rights reserved. The codes documented in this report are preliminary and upon coder review may  be revised to meet current compliance requirements. Efrain Sella MD, MD 02/12/2018 8:19:48 AM This report has been signed electronically. Number of Addenda: 0 Note  Initiated On: 02/12/2018 7:25 AM      [facility]

## 2018-02-13 ENCOUNTER — Inpatient Hospital Stay: Admission: RE | Admit: 2018-02-13 | Payer: BC Managed Care – PPO | Source: Ambulatory Visit

## 2018-02-13 LAB — SURGICAL PATHOLOGY

## 2018-02-15 ENCOUNTER — Other Ambulatory Visit: Payer: Self-pay

## 2018-02-15 ENCOUNTER — Encounter
Admission: RE | Admit: 2018-02-15 | Discharge: 2018-02-15 | Disposition: A | Discharge status: Home / Self Care | Payer: BC Managed Care – PPO | Source: Ambulatory Visit | Attending: Obstetrics and Gynecology | Admitting: Obstetrics and Gynecology

## 2018-02-15 DIAGNOSIS — Z01812 Encounter for preprocedural laboratory examination: Secondary | ICD-10-CM | POA: Insufficient documentation

## 2018-02-15 DIAGNOSIS — N809 Endometriosis, unspecified: Secondary | ICD-10-CM

## 2018-02-15 HISTORY — DX: Gastro-esophageal reflux disease without esophagitis: K21.9

## 2018-02-15 HISTORY — DX: Cardiac murmur, unspecified: R01.1

## 2018-02-15 LAB — PROTIME-INR
INR: 0.96
PROTHROMBIN TIME: 12.7 s (ref 11.4–15.2)

## 2018-02-15 LAB — APTT: aPTT: 28 seconds (ref 24–36)

## 2018-02-15 LAB — BASIC METABOLIC PANEL
ANION GAP: 10 (ref 5–15)
BUN: 23 mg/dL — ABNORMAL HIGH (ref 6–20)
CALCIUM: 9.3 mg/dL (ref 8.9–10.3)
CO2: 28 mmol/L (ref 22–32)
CREATININE: 1.09 mg/dL — AB (ref 0.44–1.00)
Chloride: 100 mmol/L (ref 98–111)
GFR calc Af Amer: >60 mL/min (ref >60)
GFR calc non Af Amer: 54 mL/min — ABNORMAL LOW (ref >60)
Glucose, Bld: 80 mg/dL (ref 70–99)
Potassium: 4.5 mmol/L (ref 3.5–5.1)
SODIUM: 138 mmol/L (ref 135–145)

## 2018-02-15 LAB — CBC WITH DIFFERENTIAL/PLATELET
BASOS ABS: 0 10*3/uL (ref 0–0.1)
BASOS PCT: 0 %
EOS ABS: 0.1 10*3/uL (ref 0–0.7)
Eosinophils Relative: 2 %
HEMATOCRIT: 33 % — AB (ref 35.0–47.0)
HEMOGLOBIN: 11.4 g/dL — AB (ref 12.0–16.0)
Lymphocytes Relative: 32 %
Lymphs Abs: 1.8 10*3/uL (ref 1.0–3.6)
MCH: 33 pg (ref 26.0–34.0)
MCHC: 34.6 g/dL (ref 32.0–36.0)
MCV: 95.2 fL (ref 80.0–100.0)
Monocytes Absolute: 0.5 10*3/uL (ref 0.2–0.9)
Monocytes Relative: 9 %
NEUTROS ABS: 3.1 10*3/uL (ref 1.4–6.5)
Neutrophils Relative %: 57 %
Platelets: 227 10*3/uL (ref 150–440)
RBC: 3.46 MIL/uL — AB (ref 3.80–5.20)
RDW: 12.5 % (ref 11.5–14.5)
WBC: 5.5 10*3/uL (ref 3.6–11.0)

## 2018-02-15 NOTE — Patient Instructions (Signed)
Your procedure is scheduled [id number], February 20, 2018  Report to [facility].     DO NOT STOP ON THE FIRST FLOOR TO REGISTER.  To find out your arrival time please call [phone] between 1PM - 3PM on Tuesday, February 19, 2018  Remember: Instructions that are not followed completely may result in serious medical risk,  up to and including death, or upon the discretion of your surgeon and anesthesiologist your  surgery may need to be rescheduled.     _X__ 1. Do not eat food after midnight the night before your procedure.                 No gum chewing or hard candies. NOTHING SOLID IN YOUR MOUTH AFTER MIDNIGHT.                  You may drink clear liquids up to 2 hours before you are scheduled to arrive for your surgery-                  DO not drink clear liquids within 2 hours of the start of your surgery.                  Clear Liquids include:  water, apple juice without pulp, clear carbohydrate                 drink such as Clearfast of Gatorade, Black Coffee or Tea (Do not add                 anything to coffee or tea).  __X__2.  On the morning of surgery brush your teeth with toothpaste and water, you                may rinse your mouth with mouthwash if you wish.                 Do not swallow any toothpaste of mouthwash.     _X__ 3.  No Alcohol for 24 hours before or after surgery.   _X__ 4.  Do Not Smoke or use e-cigarettes For 24 Hours Prior to Your Surgery.                 Do not use any chewable tobacco products for at least 6 hours prior to                 surgery.  ____  5.  Bring all medications with you on the day of surgery if instructed.    _X__  6.  Notify your doctor if there is any change in your medical condition      (cold, fever, infections).     Do not wear jewelry, make-up, hairpins, clips or nail polish. Do not wear lotions, powders, or perfumes. You may wear deodorant. Do not shave 48 hours prior to  surgery. Men may shave face and neck. Do not bring valuables to the hospital.    [facility] is not responsible for any belongings or valuables.  Contacts, dentures or bridgework may not be worn into surgery. Leave your suitcase in the car. After surgery it may be brought to your room. For patients admitted to the hospital, discharge time is determined by your treatment team.   Patients discharged the day of surgery will not be allowed to drive home.   Please read over the following fact sheets that you were given:  PREPARING FOR SURGERY                          ADVANCED MEDICAL DIRECTIVES    ____ Take these medicines the morning of surgery with A SIP OF WATER:    1. PROTONIX  2. ESTRACE  3. ZOLOFT  4.  5.  6.  _X___ Fleet Enema (as directed) USE MORNING OF SURGERY   _X___ Use CHG Soap as directed  ____ Use inhalers on the day of surgery  _X___ Stop metformin 2 days prior to surgery.  LAST DOSE ON Sunday July 7TH                  DO NOT TAKE GLIMEPIRIDE ON THE MORNING OF SURGERY.   _X___ Stop ALL ASPIRIN PRODUCTS AS OF TODAY  __X__ Stop Anti-inflammatories AS OF TODAY                            TYLENOL IS OKAY TO CONTINUE TAKING UNTIL SURGERY   _X___ Stop supplements until after surgery.  THIS INCLUDES CALCIUM PLUS d / MULTIVITAMINS / POTASSIUM.                   DO NOT TAKE ANY MORE AFTER TODAY  ____ Bring C-Pap to the hospital.   CONTINUE TAKING [place] PM AS USUAL.  CONTINUE TAKING COLACE & LISINOPRIL AS USUAL BUT DO NOT TAKE ON MORNING OF SURGERY.  STOP PHENTERMINE AS OF Sunday  YOU MAY USE THE KENALOG CREAM BUT NOT AFTER USING SOAP FOR SURGERY

## 2018-02-20 ENCOUNTER — Other Ambulatory Visit: Payer: Self-pay

## 2018-02-20 ENCOUNTER — Encounter
Admission: AD | Disposition: A | Discharge status: Home / Self Care | Payer: Self-pay | Source: Home / Self Care | Attending: Surgery

## 2018-02-20 ENCOUNTER — Inpatient Hospital Stay
Admission: AD | Admit: 2018-02-20 | Discharge: 2018-02-22 | DRG: 331 | Disposition: A | Discharge status: Home / Self Care | Payer: BC Managed Care – PPO | Attending: Surgery | Admitting: Surgery

## 2018-02-20 ENCOUNTER — Ambulatory Visit: Payer: BC Managed Care – PPO | Admitting: Certified Registered"

## 2018-02-20 ENCOUNTER — Encounter: Payer: Self-pay | Admitting: *Deleted

## 2018-02-20 DIAGNOSIS — E119 Type 2 diabetes mellitus without complications: Secondary | ICD-10-CM | POA: Diagnosis present

## 2018-02-20 DIAGNOSIS — R1900 Intra-abdominal and pelvic swelling, mass and lump, unspecified site: Secondary | ICD-10-CM | POA: Diagnosis not present

## 2018-02-20 DIAGNOSIS — Z5331 Laparoscopic surgical procedure converted to open procedure: Secondary | ICD-10-CM

## 2018-02-20 DIAGNOSIS — Z801 Family history of malignant neoplasm of trachea, bronchus and lung: Secondary | ICD-10-CM

## 2018-02-20 DIAGNOSIS — Z8041 Family history of malignant neoplasm of ovary: Secondary | ICD-10-CM | POA: Diagnosis not present

## 2018-02-20 DIAGNOSIS — E669 Obesity, unspecified: Secondary | ICD-10-CM | POA: Diagnosis present

## 2018-02-20 DIAGNOSIS — F411 Generalized anxiety disorder: Secondary | ICD-10-CM | POA: Diagnosis present

## 2018-02-20 DIAGNOSIS — Z90710 Acquired absence of both cervix and uterus: Secondary | ICD-10-CM | POA: Diagnosis not present

## 2018-02-20 DIAGNOSIS — Z880 Allergy status to penicillin: Secondary | ICD-10-CM | POA: Diagnosis not present

## 2018-02-20 DIAGNOSIS — Z885 Allergy status to narcotic agent status: Secondary | ICD-10-CM | POA: Diagnosis not present

## 2018-02-20 DIAGNOSIS — N805 Endometriosis of intestine: Secondary | ICD-10-CM | POA: Diagnosis present

## 2018-02-20 DIAGNOSIS — Z6832 Body mass index (BMI) 32.0-32.9, adult: Secondary | ICD-10-CM

## 2018-02-20 DIAGNOSIS — Z808 Family history of malignant neoplasm of other organs or systems: Secondary | ICD-10-CM | POA: Diagnosis not present

## 2018-02-20 DIAGNOSIS — K6389 Other specified diseases of intestine: Secondary | ICD-10-CM | POA: Diagnosis not present

## 2018-02-20 DIAGNOSIS — K5900 Constipation, unspecified: Secondary | ICD-10-CM | POA: Diagnosis present

## 2018-02-20 DIAGNOSIS — F419 Anxiety disorder, unspecified: Secondary | ICD-10-CM | POA: Diagnosis present

## 2018-02-20 DIAGNOSIS — F329 Major depressive disorder, single episode, unspecified: Secondary | ICD-10-CM | POA: Diagnosis present

## 2018-02-20 DIAGNOSIS — R1903 Right lower quadrant abdominal swelling, mass and lump: Secondary | ICD-10-CM | POA: Diagnosis present

## 2018-02-20 DIAGNOSIS — Z7984 Long term (current) use of oral hypoglycemic drugs: Secondary | ICD-10-CM

## 2018-02-20 DIAGNOSIS — Z888 Allergy status to other drugs, medicaments and biological substances status: Secondary | ICD-10-CM | POA: Diagnosis not present

## 2018-02-20 DIAGNOSIS — I10 Essential (primary) hypertension: Secondary | ICD-10-CM | POA: Diagnosis present

## 2018-02-20 DIAGNOSIS — K660 Peritoneal adhesions (postprocedural) (postinfection): Secondary | ICD-10-CM | POA: Diagnosis present

## 2018-02-20 HISTORY — PX: LAPAROSCOPY: SHX197

## 2018-02-20 HISTORY — DX: Other specified diseases of intestine: K63.89

## 2018-02-20 LAB — TYPE AND SCREEN
ABO/RH(D): A POS
ANTIBODY SCREEN: NEGATIVE

## 2018-02-20 LAB — GLUCOSE, CAPILLARY
GLUCOSE-CAPILLARY: 211 mg/dL — AB (ref 70–99)
Glucose-Capillary: 125 mg/dL — ABNORMAL HIGH (ref 70–99)

## 2018-02-20 LAB — ABO/RH: ABO/RH(D): A POS

## 2018-02-20 SURGERY — LAPAROSCOPY, DIAGNOSTIC
Anesthesia: General

## 2018-02-20 MED ORDER — HEPARIN SODIUM (PORCINE) 5000 UNIT/ML IJ SOLN
INTRAMUSCULAR | Status: AC
  Filled 2018-02-20: qty 1

## 2018-02-20 MED ORDER — PROPOFOL 10 MG/ML IV BOLUS
INTRAVENOUS | Status: AC
  Filled 2018-02-20: qty 20

## 2018-02-20 MED ORDER — FENTANYL CITRATE (PF) 100 MCG/2ML IJ SOLN: 25.0000 ug | INTRAMUSCULAR | Status: DC | PRN

## 2018-02-20 MED ORDER — PROPOFOL 10 MG/ML IV BOLUS
INTRAVENOUS | Status: DC | PRN
  Administered 2018-02-20: 150 mg via INTRAVENOUS
  Administered 2018-02-20: 50 mg via INTRAVENOUS

## 2018-02-20 MED ORDER — FLUMAZENIL 0.5 MG/5ML IV SOLN
0.2000 mg | Freq: Once | INTRAVENOUS | Status: AC
  Administered 2018-02-20: 0.2 mg via INTRAVENOUS

## 2018-02-20 MED ORDER — LACTATED RINGERS IV SOLN: INTRAVENOUS | Status: DC | PRN

## 2018-02-20 MED ORDER — ROCURONIUM BROMIDE 50 MG/5ML IV SOLN
INTRAVENOUS | Status: AC
  Filled 2018-02-20: qty 1

## 2018-02-20 MED ORDER — ROCURONIUM BROMIDE 100 MG/10ML IV SOLN
INTRAVENOUS | Status: DC | PRN
  Administered 2018-02-20: 50 mg via INTRAVENOUS

## 2018-02-20 MED ORDER — GABAPENTIN 300 MG PO CAPS
ORAL_CAPSULE | ORAL | Status: AC
  Filled 2018-02-20: qty 1

## 2018-02-20 MED ORDER — PHENYLEPHRINE HCL 10 MG/ML IJ SOLN
INTRAMUSCULAR | Status: AC
  Filled 2018-02-20: qty 1

## 2018-02-20 MED ORDER — METOPROLOL TARTRATE 5 MG/5ML IV SOLN
5.0000 mg | Freq: Once | INTRAVENOUS | Status: AC
  Administered 2018-02-20: 5 mg via INTRAVENOUS

## 2018-02-20 MED ORDER — ONDANSETRON 4 MG PO TBDP: 4.0000 mg | ORAL_TABLET | Freq: Four times a day (QID) | ORAL | Status: DC | PRN

## 2018-02-20 MED ORDER — DEXAMETHASONE SODIUM PHOSPHATE 10 MG/ML IJ SOLN
INTRAMUSCULAR | Status: DC | PRN
  Administered 2018-02-20: 5 mg via INTRAVENOUS

## 2018-02-20 MED ORDER — CHLORHEXIDINE GLUCONATE CLOTH 2 % EX PADS
6.0000 | MEDICATED_PAD | Freq: Once | CUTANEOUS | Status: AC
  Administered 2018-02-20: 6 via TOPICAL

## 2018-02-20 MED ORDER — ONDANSETRON HCL 4 MG/2ML IJ SOLN
4.0000 mg | Freq: Once | INTRAMUSCULAR | Status: AC | PRN
  Administered 2018-02-20: 4 mg via INTRAVENOUS

## 2018-02-20 MED ORDER — ACETAMINOPHEN NICU IV SYRINGE 10 MG/ML
INTRAVENOUS | Status: AC
  Filled 2018-02-20: qty 1

## 2018-02-20 MED ORDER — FENTANYL CITRATE (PF) 100 MCG/2ML IJ SOLN
INTRAMUSCULAR | Status: AC
  Filled 2018-02-20: qty 4

## 2018-02-20 MED ORDER — CIPROFLOXACIN IN D5W 400 MG/200ML IV SOLN
400.0000 mg | Freq: Two times a day (BID) | INTRAVENOUS | Status: AC
  Administered 2018-02-20: 400 mg via INTRAVENOUS
  Filled 2018-02-20: qty 200

## 2018-02-20 MED ORDER — MIDAZOLAM HCL 2 MG/2ML IJ SOLN
INTRAMUSCULAR | Status: AC
  Filled 2018-02-20: qty 2

## 2018-02-20 MED ORDER — ENOXAPARIN SODIUM 40 MG/0.4ML ~~LOC~~ SOLN
40.0000 mg | SUBCUTANEOUS | Status: DC
  Administered 2018-02-21 – 2018-02-22 (×2): 40 mg via SUBCUTANEOUS
  Filled 2018-02-20 (×2): qty 0.4

## 2018-02-20 MED ORDER — KETAMINE HCL 10 MG/ML IJ SOLN
INTRAMUSCULAR | Status: DC | PRN
  Administered 2018-02-20: 30 mg via INTRAVENOUS
  Administered 2018-02-20: 20 mg via INTRAVENOUS

## 2018-02-20 MED ORDER — PHENYLEPHRINE HCL 10 MG/ML IJ SOLN
INTRAMUSCULAR | Status: DC | PRN
  Administered 2018-02-20: 100 ug via INTRAVENOUS
  Administered 2018-02-20: 200 ug via INTRAVENOUS
  Administered 2018-02-20 (×2): 100 ug via INTRAVENOUS

## 2018-02-20 MED ORDER — OXYCODONE HCL 5 MG PO TABS
5.0000 mg | ORAL_TABLET | ORAL | Status: DC | PRN
  Administered 2018-02-22: 5 mg via ORAL
  Filled 2018-02-20: qty 1

## 2018-02-20 MED ORDER — LIDOCAINE HCL (PF) 2 % IJ SOLN
INTRAMUSCULAR | Status: AC
  Filled 2018-02-20: qty 10

## 2018-02-20 MED ORDER — FLEET ENEMA 7-19 GM/118ML RE ENEM
1.0000 | ENEMA | Freq: Once | RECTAL | Status: AC
  Administered 2018-02-19: 1 via RECTAL

## 2018-02-20 MED ORDER — SODIUM CHLORIDE 0.9 % IV SOLN
INTRAVENOUS | Status: DC
  Administered 2018-02-20 (×2): via INTRAVENOUS

## 2018-02-20 MED ORDER — SEVOFLURANE IN SOLN
RESPIRATORY_TRACT | Status: AC
  Filled 2018-02-20: qty 250

## 2018-02-20 MED ORDER — LIDOCAINE HCL (CARDIAC) PF 100 MG/5ML IV SOSY
PREFILLED_SYRINGE | INTRAVENOUS | Status: DC | PRN
  Administered 2018-02-20: 50 mg via INTRAVENOUS

## 2018-02-20 MED ORDER — SUGAMMADEX SODIUM 200 MG/2ML IV SOLN
INTRAVENOUS | Status: AC
  Filled 2018-02-20: qty 2

## 2018-02-20 MED ORDER — ARTIFICIAL TEARS OPHTHALMIC OINT
TOPICAL_OINTMENT | OPHTHALMIC | Status: AC
  Filled 2018-02-20: qty 3.5

## 2018-02-20 MED ORDER — GLYCOPYRROLATE 0.2 MG/ML IJ SOLN
INTRAMUSCULAR | Status: DC | PRN
  Administered 2018-02-20: 0.1 mg via INTRAVENOUS

## 2018-02-20 MED ORDER — BUPIVACAINE-EPINEPHRINE (PF) 0.25% -1:200000 IJ SOLN
INTRAMUSCULAR | Status: AC
  Filled 2018-02-20: qty 30

## 2018-02-20 MED ORDER — GABAPENTIN 300 MG PO CAPS
300.0000 mg | ORAL_CAPSULE | ORAL | Status: AC
  Administered 2018-02-20: 300 mg via ORAL

## 2018-02-20 MED ORDER — ONDANSETRON HCL 4 MG/2ML IJ SOLN: 4.0000 mg | Freq: Four times a day (QID) | INTRAMUSCULAR | Status: DC | PRN

## 2018-02-20 MED ORDER — HYDROMORPHONE HCL 1 MG/ML IJ SOLN
INTRAMUSCULAR | Status: DC | PRN
  Administered 2018-02-20: 1 mg via INTRAVENOUS

## 2018-02-20 MED ORDER — METOPROLOL TARTRATE 5 MG/5ML IV SOLN
INTRAVENOUS | Status: AC
  Administered 2018-02-20: 5 mg via INTRAVENOUS
  Filled 2018-02-20: qty 5

## 2018-02-20 MED ORDER — ONDANSETRON HCL 4 MG/2ML IJ SOLN
INTRAMUSCULAR | Status: AC
  Filled 2018-02-20: qty 2

## 2018-02-20 MED ORDER — ACETAMINOPHEN 500 MG PO TABS
1000.0000 mg | ORAL_TABLET | Freq: Four times a day (QID) | ORAL | Status: DC
  Administered 2018-02-20 – 2018-02-22 (×6): 1000 mg via ORAL
  Filled 2018-02-20 (×7): qty 2

## 2018-02-20 MED ORDER — LIDOCAINE HCL URETHRAL/MUCOSAL 2 % EX GEL
CUTANEOUS | Status: AC
  Filled 2018-02-20: qty 5

## 2018-02-20 MED ORDER — FLUMAZENIL 0.5 MG/5ML IV SOLN
INTRAVENOUS | Status: AC
  Filled 2018-02-20: qty 5

## 2018-02-20 MED ORDER — ONDANSETRON HCL 4 MG/2ML IJ SOLN
INTRAMUSCULAR | Status: DC | PRN
  Administered 2018-02-20: 4 mg via INTRAVENOUS

## 2018-02-20 MED ORDER — ACETAMINOPHEN 10 MG/ML IV SOLN
INTRAVENOUS | Status: DC | PRN
  Administered 2018-02-20: 1000 mg via INTRAVENOUS

## 2018-02-20 MED ORDER — DEXAMETHASONE SODIUM PHOSPHATE 10 MG/ML IJ SOLN
INTRAMUSCULAR | Status: AC
  Filled 2018-02-20: qty 1

## 2018-02-20 MED ORDER — SUGAMMADEX SODIUM 200 MG/2ML IV SOLN
INTRAVENOUS | Status: DC | PRN
  Administered 2018-02-20: 200 mg via INTRAVENOUS

## 2018-02-20 MED ORDER — KCL IN DEXTROSE-NACL 20-5-0.45 MEQ/L-%-% IV SOLN
INTRAVENOUS | Status: DC
  Administered 2018-02-20 – 2018-02-22 (×4): via INTRAVENOUS
  Filled 2018-02-20 (×6): qty 1000

## 2018-02-20 MED ORDER — BUPIVACAINE HCL (PF) 0.5 % IJ SOLN
INTRAMUSCULAR | Status: AC
  Filled 2018-02-20: qty 30

## 2018-02-20 MED ORDER — BUPIVACAINE-EPINEPHRINE (PF) 0.25% -1:200000 IJ SOLN
INTRAMUSCULAR | Status: DC | PRN
  Administered 2018-02-20: 20 mL

## 2018-02-20 MED ORDER — CHLORHEXIDINE GLUCONATE CLOTH 2 % EX PADS: 6.0000 | MEDICATED_PAD | Freq: Once | CUTANEOUS | Status: DC

## 2018-02-20 MED ORDER — KETOROLAC TROMETHAMINE 30 MG/ML IJ SOLN
INTRAMUSCULAR | Status: DC | PRN
  Administered 2018-02-20: 30 mg via INTRAVENOUS

## 2018-02-20 MED ORDER — HYDROMORPHONE HCL 1 MG/ML IJ SOLN
INTRAMUSCULAR | Status: AC
Start: 2018-02-20 — End: ?
  Filled 2018-02-20: qty 1

## 2018-02-20 MED ORDER — GLYCOPYRROLATE 0.2 MG/ML IJ SOLN
INTRAMUSCULAR | Status: AC
  Filled 2018-02-20: qty 1

## 2018-02-20 MED ORDER — KETOROLAC TROMETHAMINE 15 MG/ML IJ SOLN
15.0000 mg | Freq: Four times a day (QID) | INTRAMUSCULAR | Status: DC
  Administered 2018-02-20 – 2018-02-22 (×8): 15 mg via INTRAVENOUS
  Filled 2018-02-20 (×8): qty 1

## 2018-02-20 MED ORDER — FENTANYL CITRATE (PF) 100 MCG/2ML IJ SOLN
INTRAMUSCULAR | Status: DC | PRN
  Administered 2018-02-20: 50 ug via INTRAVENOUS
  Administered 2018-02-20: 100 ug via INTRAVENOUS
  Administered 2018-02-20: 50 ug via INTRAVENOUS

## 2018-02-20 MED ORDER — BUPIVACAINE HCL (PF) 0.5 % IJ SOLN
INTRAMUSCULAR | Status: DC | PRN
  Administered 2018-02-20: 7 mL

## 2018-02-20 MED ORDER — HEPARIN SODIUM (PORCINE) 5000 UNIT/ML IJ SOLN
5000.0000 [IU] | Freq: Once | INTRAMUSCULAR | Status: AC
  Administered 2018-02-20: 5000 [IU] via SUBCUTANEOUS

## 2018-02-20 MED ORDER — MIDAZOLAM HCL 2 MG/2ML IJ SOLN
INTRAMUSCULAR | Status: DC | PRN
  Administered 2018-02-20: 2 mg via INTRAVENOUS

## 2018-02-20 MED ORDER — METRONIDAZOLE IN NACL 5-0.79 MG/ML-% IV SOLN
500.0000 mg | Freq: Once | INTRAVENOUS | Status: AC
  Administered 2018-02-20: 500 mg via INTRAVENOUS
  Filled 2018-02-20: qty 100

## 2018-02-20 MED ORDER — EPHEDRINE SULFATE 50 MG/ML IJ SOLN
INTRAMUSCULAR | Status: DC | PRN
  Administered 2018-02-20: 20 mg via INTRAVENOUS

## 2018-02-20 SURGICAL SUPPLY — 55 items
"PENCIL ELECTRO HAND CTR " (MISCELLANEOUS) ×1 IMPLANT
BLADE SURG 15 STRL LF DISP TIS (BLADE) ×1 IMPLANT
BLADE SURG 15 STRL SS (BLADE) ×2
BLADE SURG SZ10 CARB STEEL (BLADE) ×2 IMPLANT
CANISTER SUCT 1200ML W/VALVE (MISCELLANEOUS) ×3 IMPLANT
CHLORAPREP W/TINT 26ML (MISCELLANEOUS) ×3 IMPLANT
CLEANER CAUTERY TIP 5X5 PAD (MISCELLANEOUS) ×1 IMPLANT
CNTNR SPEC 2.5X3XGRAD LEK (MISCELLANEOUS)
CONT SPEC 4OZ STER OR WHT (MISCELLANEOUS)
CONTAINER SPEC 2.5X3XGRAD LEK (MISCELLANEOUS) ×1 IMPLANT
COVER LIGHT HANDLE STERIS (MISCELLANEOUS) ×2 IMPLANT
DERMABOND ADVANCED (GAUZE/BANDAGES/DRESSINGS) ×2
DERMABOND ADVANCED .7 DNX12 (GAUZE/BANDAGES/DRESSINGS) ×1 IMPLANT
DEVICE TROCAR PUNCTURE CLOSURE (ENDOMECHANICALS) ×1 IMPLANT
DRSG TELFA 4X3 1S NADH ST (GAUZE/BANDAGES/DRESSINGS) ×3 IMPLANT
ELECT REM PT RETURN 9FT ADLT (ELECTROSURGICAL) ×3
ELECTRODE REM PT RTRN 9FT ADLT (ELECTROSURGICAL) ×1 IMPLANT
GLOVE BIO SURGEON STRL SZ8 (GLOVE) ×3 IMPLANT
GLOVE INDICATOR 8.0 STRL GRN (GLOVE) ×17 IMPLANT
GOWN STRL REUS W/ TWL LRG LVL3 (GOWN DISPOSABLE) ×2 IMPLANT
GOWN STRL REUS W/TWL LRG LVL3 (GOWN DISPOSABLE) ×4
HANDLE YANKAUER SUCT BULB TIP (MISCELLANEOUS) ×2 IMPLANT
IRRIGATION STRYKERFLOW (MISCELLANEOUS) ×1 IMPLANT
IRRIGATOR STRYKERFLOW (MISCELLANEOUS)
IV NS 1000ML (IV SOLUTION) ×2
IV NS 1000ML BAXH (IV SOLUTION) ×1 IMPLANT
L-HOOK LAP DISP 36CM (ELECTROSURGICAL)
LHOOK LAP DISP 36CM (ELECTROSURGICAL) ×1 IMPLANT
NEEDLE HYPO 22GX1.5 SAFETY (NEEDLE) ×3 IMPLANT
PACK COLON CLEAN CLOSURE (MISCELLANEOUS) ×2 IMPLANT
PACK LAP CHOLECYSTECTOMY (MISCELLANEOUS) ×3 IMPLANT
PAD CLEANER CAUTERY TIP 5X5 (MISCELLANEOUS)
PENCIL ELECTRO HAND CTR (MISCELLANEOUS) ×3 IMPLANT
RELOAD LINEAR CUT PROX 55 BLUE (ENDOMECHANICALS) ×9 IMPLANT
RELOAD STAPLE 55 3.8 BLU REG (ENDOMECHANICALS) IMPLANT
SCISSORS METZENBAUM CVD 33 (INSTRUMENTS) ×1 IMPLANT
SHEARS ENDO 5MM 31CM (CUTTER) ×2 IMPLANT
SHEARS HARMONIC ACE PLUS 36CM (ENDOMECHANICALS) ×2 IMPLANT
SLEEVE ADV FIXATION 5X100MM (TROCAR) ×2 IMPLANT
SLEEVE ENDOPATH XCEL 5M (ENDOMECHANICALS) ×4 IMPLANT
SPONGE LAP 18X18 RF (DISPOSABLE) ×3 IMPLANT
STAPLER PROXIMATE 55 BLUE (STAPLE) ×2 IMPLANT
STAPLER SKIN PROX 35W (STAPLE) ×2 IMPLANT
SUT MNCRL AB 4-0 PS2 18 (SUTURE) ×1 IMPLANT
SUT SILK 2 0SH CR/8 30 (SUTURE) ×4 IMPLANT
SUT VIC AB 3-0 SH 27 (SUTURE) ×6
SUT VIC AB 3-0 SH 27X BRD (SUTURE) IMPLANT
SUT VICRYL 0 AB UR-6 (SUTURE) ×1 IMPLANT
TOWEL OR 17X26 4PK STRL BLUE (TOWEL DISPOSABLE) ×2 IMPLANT
TRAP SPECIMEN MUCOUS 40CC (MISCELLANEOUS) ×1 IMPLANT
TROCAR BLUNT TIP 12MM OMST12BT (TROCAR) IMPLANT
TROCAR XCEL BLUNT TIP 100MML (ENDOMECHANICALS) ×1 IMPLANT
TROCAR XCEL NON-BLD 5MMX100MML (ENDOMECHANICALS) ×2 IMPLANT
TROCAR Z-THREAD OPTICAL 5X100M (TROCAR) ×1 IMPLANT
TUBING INSUFFLATION (TUBING) ×1 IMPLANT

## 2018-02-20 NOTE — Interval H&P Note (Signed)
History and Physical Interval Note:  02/20/2018 10:48 AM  Haley Jones  has presented today for surgery, with the diagnosis of painful mass  The various methods of treatment have been discussed with the patient and family. After consideration of risks, benefits and other options for treatment, the patient has consented to  Procedure(s): LAPAROSCOPY DIAGNOSTIC, ( RESECTION OF RIGHT LOWER QUADRANT ABDOMINAL MASS) (N/A) and possible X lap as a surgical intervention .  The patient's history has been reviewed, patient examined, no change in status, stable for surgery.  I have reviewed the patient's chart and labs.  Questions were answered to the patient's satisfaction.     Haley Jones

## 2018-02-20 NOTE — Anesthesia Postprocedure Evaluation (Signed)
Anesthesia Post Note  Patient: Haley Jones  Procedure(s) Performed: LAPAROSCOPY DIAGNOSTIC, ( RESECTION OF RIGHT LOWER QUADRANT ABDOMINAL MASS) (N/A )  Patient location during evaluation: PACU Anesthesia Type: General Level of consciousness: awake and alert and oriented Pain management: pain level controlled Vital Signs Assessment: post-procedure vital signs reviewed and stable Respiratory status: spontaneous breathing Cardiovascular status: blood pressure returned to baseline Anesthetic complications: no     Last Vitals:  Vitals:   02/20/18 1528 02/20/18 1537  BP:  (!) 157/88  Pulse: (!) 121 (!) 115  Resp: (!) 9 12  Temp:    SpO2: 95% 94%    Last Pain:  Vitals:   02/20/18 1450  TempSrc:   PainSc: Asleep                 Sahana Boyland

## 2018-02-20 NOTE — Anesthesia Procedure Notes (Signed)
Procedure Name: Intubation Date/Time: 02/20/2018 12:12 PM Performed by: Nile Riggs, CRNA Pre-anesthesia Checklist: Patient identified, Emergency Drugs available, Suction available, Patient being monitored and Timeout performed Patient Re-evaluated:Patient Re-evaluated prior to induction Oxygen Delivery Method: Circle system utilized Preoxygenation: Pre-oxygenation with 100% oxygen Induction Type: IV induction Ventilation: Mask ventilation without difficulty Laryngoscope Size: Miller and 2 Grade View: Grade I Tube type: Oral Tube size: 7.0 mm Number of attempts: 1 Placement Confirmation: ETT inserted through vocal cords under direct vision,  positive ETCO2,  CO2 detector and breath sounds checked- equal and bilateral Secured at: 18 cm Tube secured with: Tape Dental Injury: Teeth and Oropharynx as per pre-operative assessment

## 2018-02-20 NOTE — Anesthesia Post-op Follow-up Note (Signed)
Anesthesia QCDR form completed.        

## 2018-02-20 NOTE — Anesthesia Preprocedure Evaluation (Signed)
Anesthesia Evaluation  Patient identified by MRN, date of birth, ID band Patient awake    Reviewed: Allergy & Precautions, H&P , NPO status , Patient's Chart, lab work & pertinent test results, reviewed documented beta blocker date and time   Airway Mallampati: II   Neck ROM: full    Dental  (+) Poor Dentition   Pulmonary neg pulmonary ROS,    Pulmonary exam normal        Cardiovascular Exercise Tolerance: Poor hypertension, On Medications negative cardio ROS Normal cardiovascular exam+ Valvular Problems/Murmurs  Rhythm:regular Rate:Normal     Neuro/Psych PSYCHIATRIC DISORDERS Anxiety Depression negative neurological ROS  negative psych ROS   GI/Hepatic negative GI ROS, Neg liver ROS, GERD  ,  Endo/Other  negative endocrine ROSdiabetes  Renal/GU negative Renal ROS  negative genitourinary   Musculoskeletal  (+) Arthritis , Osteoarthritis,    Abdominal   Peds  Hematology negative hematology ROS (+)   Anesthesia Other Findings Past Medical History: No date: Adnexal mass No date: Anxiety No date: Chronic back pain No date: Depression No date: Diabetes mellitus without complication ([place]) 2/91/9166: Hoarse No date: Hypertension 12/06/2017: Weight loss Past Surgical History: 2008: ABDOMINAL HYSTERECTOMY No date: CESAREAN SECTION No date: COLONOSCOPY No date: EYE SURGERY 2011: [place]; Left BMI    Body Mass Index:  32.19 kg/m     Reproductive/Obstetrics negative OB ROS                             Anesthesia Physical  Anesthesia Plan  ASA: III  Anesthesia Plan: General   Post-op Pain Management:    Induction:   PONV Risk Score and Plan:   Airway Management Planned:   Additional Equipment:   Intra-op Plan:   Post-operative Plan:   Informed Consent: I have reviewed the patients History and Physical, chart, labs and discussed the procedure including the  risks, benefits and alternatives for the proposed anesthesia with the patient or authorized representative who has indicated his/her understanding and acceptance.   Dental Advisory Given  Plan Discussed with: CRNA  Anesthesia Plan Comments:         Anesthesia Quick Evaluation

## 2018-02-20 NOTE — Transfer of Care (Signed)
Immediate Anesthesia Transfer of Care Note  Patient: Haley Jones  Procedure(s) Performed: LAPAROSCOPY DIAGNOSTIC, ( RESECTION OF RIGHT LOWER QUADRANT ABDOMINAL MASS) (N/A )  Patient Location: PACU  Anesthesia Type:General  Level of Consciousness: drowsy and patient cooperative  Airway & Oxygen Therapy: Patient Spontanous Breathing and Patient connected to face mask oxygen  Post-op Assessment: Report given to RN, Post -op Vital signs reviewed and stable and Patient moving all extremities  Post vital signs: Reviewed and stable  Last Vitals:  Vitals Value Taken Time  BP    Temp    Pulse 114 02/20/2018  2:50 PM  Resp 13 02/20/2018  2:50 PM  SpO2 97 % 02/20/2018  2:50 PM  Vitals shown include unvalidated device data.  Last Pain:  Vitals:   02/20/18 0923  TempSrc: Temporal  PainSc: 0-No pain         Complications: No apparent anesthesia complications

## 2018-02-20 NOTE — Progress Notes (Signed)
Patient seen and examined, chart reviewed. Patient today reports intermittent RLQ abdominal pain since previously evaluated 5/6 and is scheduled for diagnostic laparoscopy with gyn-oncologist Dr. Fransisca Connors today with general surgery available to assist as needed. All of patient's and her husband's questions were answered in this context to their expressed satisfaction. Please call if any questions or concerns.  -- Marilynne Drivers Rosana Hoes, MD, [place]: [facility] - Partnering for exceptional care. Office: [phone]

## 2018-02-20 NOTE — Op Note (Signed)
SURGICAL OPERATIVE REPORT  DATE OF PROCEDURE: 02/20/2018  ATTENDING Surgeon(s): Marilynne Drivers. 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 At both times, CT abdominal imaging identified a RLQ cystic structure that was likewise first identified on a CT in  02/2016. Each of her recent CT's also visualized severe constipation with which patient reported she has struggled all her life. Due to radiology differential having included focal small bowel lymphoma, patient was referred to oncology, who presented patient during tumor board, at which consensus was for symptomatic Meckel's diverticulum, and patient was referred accordingly to general surgery. However, patient reported when evaluated in general surgery office that her pain had resolved following a large BM. Patient had also during this time underwent pelvic ultrasound and was  referred accordingly to gyn-oncology. At that time, however, patient reported intermittent RLQ abdominal pain and was scheduled for diagnostic laparoscopy with request for general surgery to assist. All risks, benefits, and alternatives to above procedures were discussed with the patient and her husband, all of patient's and her husband's questions were answered to their expressed satisfaction, and informed consent was obtained and documented.  DETAILS OF PROCEDURE: Patient was brought to the operating suite and appropriately identified. General anesthesia was administered along with appropriate pre-operative antibiotics, and endotracheal intubation was performed by anesthetist. In supine position with legs in stirrups, Foley catheter was inserted and operative site was prepped and draped in the usual sterile fashion. Following a brief time out, initial 5 mm incision was made in a natural skin crease just below the umbilicus. Fascia was then elevated, and a Verress needle was inserted and its proper position confirmed using saline meniscus test prior to abdominal insufflation.  Upon insufflation of the abdominal cavity with carbon dioxide to a well-tolerated pressure of 12-15 mmHg, a 5 mm peri-umbilical port followed by laparoscope were inserted and used to inspect the abdominal cavity and its contents with no injuries from insertion of the first trochar noted. Immediately noted, however was a inflamed loop of small intestine adherent to the suprapubic anterior abdominal wall with what appeared to be a short length wide-mouthed Meckel's diverticulum with what appeared to be Meckel's diverticulitis. Two additional trocars were inserted, a 5 mm port at the Left lower quadrant position and another 5 mm port at the RLQ position. The table was then placed in Trendelenburg position with the Right side up, and blunt graspers were gently used to carefully bluntly separate a loop of adherent small bowel from the  inflamed presumed Meckel's diverticulum. Harmonic scalpel was then used to divide dense adhesions between the inflamed presumed Meckel's diverticulum and  small bowel mesenteric fat. After doing so and assuring hemostasis, however, it became clear that there was a second segment of bowel that appears to connected by fistula to the diverticulum. Also, what appeared at first to be an inflamed Meckel's diverticulum was hardened and solid rather than a hollow viscus structure. Accordingly, at this time, decision was made to re-open patient's prior Pfannenstiel incision immediately overlying and via which to extract this segment of small bowel and to perform small bowel resection with primary anastomosis. Abdominal cavity was then briefly explored with no additional significant findings.  Laparoscopic ports were removed, and pfannenstiel incision was re-opened using a #10 blade scalpel, and incision was extended deep through subcutaneous tissues and fascia between rectus muscles, which were retracted laterally. Peritoneum was then identified and sharply opened with care to avoid injury to underlying viscera. Affected loop of small intestine was identified and externalized. 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 Mesenteric defects were created on each side of the small intestinal mass along normal segments of small intestine. GIA-55 was then used to staple and divide each segment, after which intervening abnormal segment was handed off of the field for pathology processing. Silk suture was then used to approximate anti-mesenteric aspect of both sides small intestine to be approximated, and enterotomies were created with care taken to avoid crossing  of staple lines. GIA-55 reload was then used to create small intestinal common channel, and one additional GIA-55 reload was used to close the common enterotomy without narrowing the intestinal lumen. Short mesenteric defect was closed using silk suture, and same silk suture was used to approximate tissue over part of small intestinal staple line in Lembert fashion.  Additional local anesthetic was then injected into both fascia and skin. Pfannenstiel incision was re-approximated in layers with interrupted 0-0 Vicryl suture, 2-0 Vicryl suture to re-approximate soft tissues, buried interrupted 3-0 Vicryl to re-approximate dermis, and surgical skin staples to re-approximate skin along Pfannenstiel incision as well as at 5 mm port sites. Adhesive semipermeable dressing was then applied to Pfannenstiel incision, while small gauze and adhesive dressings were applied to 5 mm port sites.  Patient was then safely able to be extubated, awakened, and transferred to PACU for post-operative monitoring and care.  I was present for all aspects of the above procedure, and no operative complications were apparent.

## 2018-02-21 ENCOUNTER — Ambulatory Visit: Payer: BC Managed Care – PPO | Admitting: Surgery

## 2018-02-21 ENCOUNTER — Encounter: Payer: Self-pay | Admitting: Surgery

## 2018-02-21 LAB — CBC
HEMATOCRIT: 29.4 % — AB (ref 35.0–47.0)
HEMOGLOBIN: 10.2 g/dL — AB (ref 12.0–16.0)
MCH: 33.4 pg (ref 26.0–34.0)
MCHC: 34.6 g/dL (ref 32.0–36.0)
MCV: 96.5 fL (ref 80.0–100.0)
Platelets: 173 10*3/uL (ref 150–440)
RBC: 3.05 MIL/uL — AB (ref 3.80–5.20)
RDW: 13.1 % (ref 11.5–14.5)
WBC: 8.6 10*3/uL (ref 3.6–11.0)

## 2018-02-21 LAB — BASIC METABOLIC PANEL
ANION GAP: 6 (ref 5–15)
BUN: 15 mg/dL (ref 6–20)
CALCIUM: 7.8 mg/dL — AB (ref 8.9–10.3)
CO2: 24 mmol/L (ref 22–32)
Chloride: 108 mmol/L (ref 98–111)
Creatinine, Ser: 0.81 mg/dL (ref 0.44–1.00)
Glucose, Bld: 205 mg/dL — ABNORMAL HIGH (ref 70–99)
POTASSIUM: 4.5 mmol/L (ref 3.5–5.1)
Sodium: 138 mmol/L (ref 135–145)

## 2018-02-21 NOTE — Progress Notes (Signed)
[facility] Day(s): 1.   Post op day(s): 1 Day Post-Op.   Interval History: Patient seen and examined, no acute events or new complaints overnight. Patient reports she is feeling well with minimal peri-incisional abdominal pain and abdominal "rumbling" without flatus yet, but denies any N/V, fever/chills, CP, or SOB. She has ambulated in her room several times, but has not yet ambulated in the hallways since surgery yesterday.  Review of Systems:  Constitutional: denies fever, chills  Respiratory: denies any shortness of breath  Cardiovascular: denies chest pain or palpitations  Gastrointestinal: abdominal pain, N/V, and bowel function as per interval history Musculoskeletal: denies pain, decreased motor or sensation Integumentary: denies any other rashes or skin discolorations except post-surgical abdominal wounds  Vital signs in last 24 hours: [min-max] current  Temp:  [96.5 F (35.8 C)-98.9 F (37.2 C)] 97.7 F (36.5 C) (07/11 0334) Pulse Rate:  [73-121] 79 (07/11 0334) Resp:  [9-98] 18 (07/11 0334) BP: (118-157)/(71-93) 129/84 (07/11 0334) SpO2:  [93 %-100 %] 100 % (07/11 0334) Weight:  [155 lb (70.3 kg)] 155 lb (70.3 kg) (07/10 0923)     Height: 4\' 10"  (147.3 cm) Weight: 155 lb (70.3 kg) BMI (Calculated): 32.4   Intake/Output this shift:  No intake/output data recorded.   Intake/Output last 2 shifts:  @IOLAST2SHIFTS @   Physical Exam:  Constitutional: alert, cooperative and no distress  Respiratory: breathing non-labored at rest  Cardiovascular: regular rate and sinus rhythm  Gastrointestinal: soft, non-tender, and non-distended with incisions well-approximated, dressings c/d/i, and no surrounding erythema or drainage  Labs:  CBC Latest Ref Rng & Units 02/21/2018 02/15/2018 01/30/2018  WBC 3.6 - 11.0 K/uL 8.6 5.5 4.6  Hemoglobin 12.0 - 16.0 g/dL 10.2(L) 11.4(L) 10.8(L)  Hematocrit 35.0 - 47.0 % 29.4(L) 33.0(L) 31.7(L)  Platelets 150 - 440 K/uL 173  227 209   CMP Latest Ref Rng & Units 02/21/2018 02/15/2018 01/30/2018  Glucose 70 - 99 mg/dL 205(H) 80 88  BUN 6 - 20 mg/dL 15 23(H) 18  Creatinine 0.44 - 1.00 mg/dL 0.81 1.09(H) 0.86  Sodium 135 - 145 mmol/L 138 138 137  Potassium 3.5 - 5.1 mmol/L 4.5 4.5 4.6  Chloride 98 - 111 mmol/L 108 100 101  CO2 22 - 32 mmol/L 24 28 25   Calcium 8.9 - 10.3 mg/dL 7.8(L) 9.3 8.8(L)  Total Protein 6.5 - 8.1 g/dL - - 7.0  Total Bilirubin 0.3 - 1.2 mg/dL - - 0.6  Alkaline Phos 38 - 126 U/L - - 55  AST 15 - 41 U/L - - 19  ALT 14 - 54 U/L - - 15   Imaging studies: No new pertinent imaging studies   Assessment/Plan: (ICD-10's: K29.89) 62 y.o. 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   - clear liquids diet  - follow up pending pathology  - pain control as needed (minimize narcotics)   - monitor abdominal exam and bowel function  - medical management of medical comorbidities   - DVT prophylaxis, ambulation enocuraged   All of the above findings and recommendations were discussed with the patient, patient's family, and patient's RN, and all of patient's and family's questions were answered to their expressed satisfaction.  -- Marilynne Drivers Rosana Hoes, MD, [place]: [facility] -  Partnering for exceptional care. Office: [phone]

## 2018-02-21 NOTE — Progress Notes (Signed)
Inpatient Diabetes Program Recommendations  AACE/ADA: New Consensus Statement on Inpatient Glycemic Control (2019)  Target Ranges:  Prepandial:   less than 140 mg/dL      Peak postprandial:   less than 180 mg/dL (1-2 hours)      Critically ill patients:  140 - 180 mg/dL   Results for SYANA, DEGRAFFENREID (MRN [id number]) as of 02/21/2018 11:33  Ref. Range 02/20/2018 15:04  Glucose-Capillary Latest Ref Range: 70 - 99 mg/dL 211 (H)   Review of Glycemic Control  Diabetes history: DM2 Outpatient Diabetes medications: Amaryl 1 mg daily, Metformin 500 mg QAM, Metformin 1000 mg QPM Current orders for Inpatient glycemic control: None  Inpatient Diabetes Program Recommendations:  Correction (SSI): While inpatient, please consider ordering CBGs with Novolog correction scale ACHS. A1C: Please consider ordering an A1C to evaluate glycemic control over the past 2-3 months.  Thanks, Barnie Alderman, RN, MSN, CDE Diabetes Coordinator Inpatient Diabetes Program [phone] (Team Pager from 8am to 5pm)

## 2018-02-22 LAB — CBC
HCT: 28.9 % — ABNORMAL LOW (ref 35.0–47.0)
Hemoglobin: 9.8 g/dL — ABNORMAL LOW (ref 12.0–16.0)
MCH: 32.7 pg (ref 26.0–34.0)
MCHC: 33.8 g/dL (ref 32.0–36.0)
MCV: 96.7 fL (ref 80.0–100.0)
PLATELETS: 169 10*3/uL (ref 150–440)
RBC: 2.99 MIL/uL — ABNORMAL LOW (ref 3.80–5.20)
RDW: 12.8 % (ref 11.5–14.5)
WBC: 5.3 10*3/uL (ref 3.6–11.0)

## 2018-02-22 LAB — BASIC METABOLIC PANEL
Anion gap: 5 (ref 5–15)
BUN: 8 mg/dL (ref 6–20)
CHLORIDE: 110 mmol/L (ref 98–111)
CO2: 25 mmol/L (ref 22–32)
Calcium: 8.1 mg/dL — ABNORMAL LOW (ref 8.9–10.3)
Creatinine, Ser: 0.63 mg/dL (ref 0.44–1.00)
GFR calc Af Amer: >60 mL/min (ref >60)
GFR calc non Af Amer: >60 mL/min (ref >60)
GLUCOSE: 169 mg/dL — AB (ref 70–99)
Potassium: 4.1 mmol/L (ref 3.5–5.1)
SODIUM: 140 mmol/L (ref 135–145)

## 2018-02-22 LAB — GLUCOSE, CAPILLARY
GLUCOSE-CAPILLARY: 207 mg/dL — AB (ref 70–99)
Glucose-Capillary: 154 mg/dL — ABNORMAL HIGH (ref 70–99)
Glucose-Capillary: 124 mg/dL — ABNORMAL HIGH (ref 70–99)

## 2018-02-22 LAB — SURGICAL PATHOLOGY

## 2018-02-22 LAB — HIV ANTIBODY (ROUTINE TESTING W REFLEX): HIV Screen 4th Generation wRfx: NONREACTIVE

## 2018-02-22 MED ORDER — INSULIN ASPART 100 UNIT/ML ~~LOC~~ SOLN: 0.0000 [IU] | Freq: Every day | SUBCUTANEOUS | Status: DC

## 2018-02-22 MED ORDER — OXYCODONE-ACETAMINOPHEN 5-325 MG PO TABS: 1.0000 | ORAL_TABLET | ORAL | 0 refills | Status: DC | PRN

## 2018-02-22 MED ORDER — INSULIN ASPART 100 UNIT/ML ~~LOC~~ SOLN
0.0000 [IU] | Freq: Three times a day (TID) | SUBCUTANEOUS | Status: DC
  Administered 2018-02-22: 3 [IU] via SUBCUTANEOUS
  Administered 2018-02-22: 5 [IU] via SUBCUTANEOUS
  Filled 2018-02-22 (×2): qty 1

## 2018-02-22 NOTE — Discharge Instructions (Addendum)
In addition to included general post-operative instructions for Small Bowel Resection,  Diet: Gradually resume home heart healthy diet.   Activity: No heavy lifting >15 - 20 pounds (children, pets, laundry, garbage) or strenuous activity until follow-up, but light activity and walking are encouraged. Do not drive or drink alcohol if taking narcotic pain medications.  Wound care: 2 days after surgery (tomorrow, Saturday 7/13), you may shower/get incision wet with soapy water and pat dry (do not rub incisions), but no baths or submerging incision underwater until follow-up.   Medications: Resume all home medications. For mild to moderate pain: acetaminophen (Tylenol) or ibuprofen/naproxen (if no kidney disease). Combining Tylenol with alcohol can substantially increase your risk of causing liver disease. Narcotic pain medications, if prescribed, can be used for severe pain, though may cause nausea, constipation, and drowsiness. If you are experiencing or concerned about developing constipation while taking narcotic pain medication, you can take over-the-counter Colace stool softener 100 mg once daily to reduce risk of constipation. Do not combine Tylenol and Percocet (or similar) within a 6 hour period as Percocet (and similar) contain(s) Tylenol. If you do not need the narcotic pain medication, you do not need to fill the prescription.  Call office [phone]) at any time if any questions, worsening pain, fevers/chills, bleeding, drainage from incision site, or other concerns.

## 2018-02-27 NOTE — Discharge Summary (Signed)
Physician Discharge Summary  Patient ID: Haley Jones MRN: [id number] DOB/AGE: 1955/08/23 62 y.o.  Admit date: 02/20/2018 Discharge date: 02/22/2018  Admission Diagnoses:  Discharge Diagnoses:  Active Problems:   Small bowel mass   Discharged Condition: good  Hospital Course: Patient is a 62 year old Female who presented in both 08/2017 and 11/2017 with RLQ abdominal pain. CT abdominal imaging identified a RLQ cystic structure that was likewise first identified on a CT in 02/2016. Each of her recent CT's also visualized severe constipation with which patient reported she has struggled all her life. Due to radiology differential having included focal small bowel lymphoma, patient was referred to oncology, who presented patient during tumor board, at which consensus was for symptomatic Meckel's diverticulum, and patient was referred accordingly to general surgery. However, patient reported when evaluated in general surgery office that her pain had resolved following a large BM. Patient had also during this time underwent pelvic ultrasound and was referred accordingly to gyn-oncology. At that time, however, patient reported intermittent RLQ abdominal pain and was scheduled for diagnostic laparoscopy with possible small bowel resection, for which general surgery was asked to assist as co-surgeon. Informed consent was obtained and documented, and patient underwent diagnostic laparoscopy with small bowel resection Rosana Hoes and [place], 02/20/2018).  Post-operatively, patient's pain improved/resolved and advancement of patient's diet and ambulation were well-tolerated. The remainder of patient's hospital course was essentially unremarkable, and discharge planning was initiated accordingly with patient safely able to be discharged home with appropriate discharge instructions, pain control, and outpatient surgical follow-up after all of her and family's questions were answered to their expressed  satisfaction.  Consults: None  Treatments: surgery: laparoscope-assisted small bowel resection Rosana Hoes and [place], 02/20/2018)  Discharge Exam: Blood pressure (!) 154/75, pulse 93, temperature 98.6 F (37 C), temperature source Oral, resp. rate 14, height 4\' 10"  (1.473 m), weight 155 lb (70.3 kg), SpO2 98 %. General appearance: alert, cooperative and no distress GI: abdomen soft and non-distended with minimal peri-incisional tenderness to palpation, incisions well-approximated without any surrounding erythema or drainage  Disposition:    Allergies as of 02/22/2018      Reactions   Etodolac Other (See Comments)   Made her feel "loopy"   Hydrocodone-acetaminophen Nausea And Vomiting, Nausea Only   Orphenadrine Citrate Other (See Comments)   Made her feel "loopy". (Norflex) muscle relaxant   Penicillins Rash   Has patient had a PCN reaction causing immediate rash, facial/tongue/throat swelling, SOB or lightheadedness with hypotension: Unknown Has patient had a PCN reaction causing severe rash involving mucus membranes or skin necrosis: Unknown Has patient had a PCN reaction that required hospitalization: No Has patient had a PCN reaction occurring within the last 10 years: No Childhood reaction If all of the above answers are "NO", then may proceed with Cephalosporin use.   Tramadol Other (See Comments)   Makes her feel very tired and loopy      Medication List    TAKE these medications   CALCIUM 600+D 600-400 MG-UNIT tablet Generic drug:  Calcium Carbonate-Vitamin D Take 2 tablets by mouth daily.   ciprofloxacin 500 MG tablet Commonly known as:  CIPRO Take 500 mg by mouth 2 (two) times daily.   clonazePAM 1 MG tablet Commonly known as:  KLONOPIN Take 1 mg by mouth at bedtime.   diclofenac 75 MG EC tablet Commonly known as:  VOLTAREN Take by mouth.   docusate sodium 100 MG capsule Commonly known as:  COLACE Take 1 capsule (100 mg total)  by mouth daily as  needed. What changed:    how much to take  reasons to take this   estradiol 2 MG tablet Commonly known as:  ESTRACE Take 2 mg by mouth daily.   glimepiride 1 MG tablet Commonly known as:  AMARYL Take 1 mg by mouth daily.   lisinopril 10 MG tablet Commonly known as:  PRINIVIL,ZESTRIL Take 10 mg by mouth daily.   metFORMIN 500 MG tablet Commonly known as:  GLUCOPHAGE Take 500-1,000 mg by mouth See admin instructions. Take 1 tablet (500 mg) in the morning & take 2 tablets (1000 mg) by mouth in the evening.   multivitamin with minerals Tabs tablet Take 1 tablet by mouth daily.   oxyCODONE-acetaminophen 5-325 MG tablet Commonly known as:  PERCOCET/ROXICET Take 1 tablet by mouth every 4 (four) hours as needed for severe pain.   pantoprazole 40 MG tablet Commonly known as:  PROTONIX Take 40 mg by mouth daily before breakfast.   phentermine 37.5 MG tablet Commonly known as:  ADIPEX-P Take 37.5 mg by mouth daily before breakfast.   Potassium 99 MG Tabs Take 99 mg by mouth daily.   sertraline 50 MG tablet Commonly known as:  ZOLOFT Take 50 mg by mouth daily.   triamcinolone cream 0.1 % Commonly known as:  KENALOG Apply 1 application topically 2 (two) times daily as needed (for rash).   TYLENOL PM EXTRA STRENGTH PO Take 2 tablets by mouth at bedtime.      Follow-up Information    Vickie Epley, MD. Schedule an appointment as soon as possible for a visit in 2 week(s).   Specialty:  General Surgery Contact information: [address] [place] [address] [phone]           Signed: Vickie Epley 02/27/2018, 10:08 PM

## 2018-03-03 DIAGNOSIS — R1900 Intra-abdominal and pelvic swelling, mass and lump, unspecified site: Secondary | ICD-10-CM

## 2018-03-12 ENCOUNTER — Ambulatory Visit (INDEPENDENT_AMBULATORY_CARE_PROVIDER_SITE_OTHER): Payer: BC Managed Care – PPO | Admitting: Surgery

## 2018-03-12 ENCOUNTER — Encounter: Payer: Self-pay | Admitting: Surgery

## 2018-03-12 VITALS — BP 89/69 | HR 96 | Temp 98.8°F | Ht <= 58 in | Wt 154.0 lb

## 2018-03-12 DIAGNOSIS — N809 Endometriosis, unspecified: Secondary | ICD-10-CM

## 2018-03-12 DIAGNOSIS — R1903 Right lower quadrant abdominal swelling, mass and lump: Secondary | ICD-10-CM

## 2018-03-12 DIAGNOSIS — K6389 Other specified diseases of intestine: Secondary | ICD-10-CM

## 2018-03-12 HISTORY — DX: Endometriosis, unspecified: N80.9

## 2018-03-12 NOTE — Progress Notes (Signed)
[facility] day:  03/13/2018    Chief Complaint: Haley Jones is a 62 y.o. female with abdominal pain and a right upper hemipelvis cystic lesion who is seen for assessment following surgical resection.  HPI:  The patient was last seen in the medical oncology clinic on 01/30/2018.  At that time, patient was having "the same pain" in her right side that she is been having since January.  She complained of increased pelvic pain following a recent transvaginal ultrasound.  Complained of menstrual-like pain that was worse on her right side.  She was using a heating pad on her lower abdomen.  No recent infections.  Exam was stable.  Hemoglobin 10.8, hematocrit 31.7, and platelets 209,000.  CA-125 elevated at 68.1 (previously 125.9).  Patient was seen in consult on 01/30/2018 by Dr. Mellody Drown (GYN oncology).  Notes reviewed.  2.5 cm RIGHT adnexal mass felt to be a possible ovarian remnant following her salpingo-oophorectomy in 2008 versus and a simple adnexal cyst.  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  Patient was scheduled for diagnostic laparoscopy on 02/20/2018.  Patient underwent routine EGD and colonoscopy on 02/12/2018 with Dr. Madolyn Frieze.  Notes reviewed.  EGD revealed esophageal mucosal changes secondary to known short segment Barrett's disease.  Pathology was negative for goblet cells,  high-grade dysplasia, and malignancy.  Colonoscopy revealed nonbleeding internal grade I hemorrhoids.  No biopsies were taken.  Patient underwent diagnostic laparoscopy with small bowel resection on 02/20/2018 with Dr. Tama High and Dr. Mellody Drown.  Notes reviewed.  Surgical pathology revealed nodular and cystic area of endometriosis measuring 3.6 cm and involving serosa of the small intestine.  Sample was negative for high-grade dysplasia  and malignancy.  Patient was discharged home following an uncomplicated surgical course on POD 2.   Patient was seen and postoperative follow-up consult on 03/12/2018 by Dr. Tama High.  Notes reviewed.  Patient doing well.  Pathology results reviewed with the patient.  Lifting restrictions remain in place x 4 more weeks.  Patient to follow-up with surgery as needed.  In the interim, patient is doing well. She notes that she "feels great". Patient denies any acute concerns. She continues to have chronic pain in her shoulder and back. Balance issues have improved. Patient denies that she has experienced any B symptoms. She denies any interval infections.   Patient advises that she maintains an adequate appetite. She is eating well. Weight today is 152 lb 12.5 oz (69.3 kg), which compared to her last visit to the clinic, represents a  4 pound decrease.  Patient denies pain in the clinic today.   Past Medical History:  Diagnosis Date  . Adnexal mass   . Anxiety   . Chronic back pain   . Depression   . Diabetes mellitus without complication ([place])   . Endometriosis 03/12/2018  . GERD (gastroesophageal reflux disease)   . Heart murmur 02/2018   undetected until she was an adult. no treatment  . Hoarse 12/06/2017  . Hypertension   . Right lower quadrant abdominal mass 12/06/2017  . Small bowel mass 02/20/2018  . Weight loss 12/06/2017    Past Surgical History:  Procedure Laterality Date  . ABDOMINAL HYSTERECTOMY  2008   ovaries also  . CESAREAN SECTION  1994  . COLONOSCOPY    . COLONOSCOPY WITH PROPOFOL N/A 02/12/2018  Procedure: COLONOSCOPY WITH PROPOFOL;  Surgeon: Toledo, Benay Pike, MD;  Location: [facility] ENDOSCOPY;  Service: Gastroenterology;  Laterality: N/A;  . ESOPHAGOGASTRODUODENOSCOPY (EGD) WITH PROPOFOL N/A 02/12/2018   Procedure: ESOPHAGOGASTRODUODENOSCOPY (EGD) WITH PROPOFOL;  Surgeon: Toledo, Benay Pike, MD;  Location: [facility] ENDOSCOPY;  Service: Gastroenterology;  Laterality: N/A;  .  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 LAPAROSCOPY N/A 02/20/2018   Procedure: LAPAROSCOPY DIAGNOSTIC, ( RESECTION OF RIGHT LOWER QUADRANT ABDOMINAL MASS);  Surgeon: Vickie Epley, MD;  Location: [facility] ORS;  Service: General;  Laterality: N/A;  . ROTATOR CUFF REPAIR Left 2011    Family History  Problem Relation Age of Onset  . Melanoma Mother   . Lung cancer Father   . Ovarian cancer Maternal Grandmother     Social History:  reports that she has never smoked. She has never used smokeless tobacco. She reports that she does not drink alcohol or use drugs.  She denies any exposure to radiation or toxins.  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  She lives in [place].  She got married yesterday (12/06/2017).  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  The patient is accompanied by her husband, Hank, today.  Allergies:  Allergies  Allergen Reactions  . Etodolac Other (See Comments)    Made her feel "loopy"  . Hydrocodone-Acetaminophen Nausea And Vomiting and Nausea Only  . Orphenadrine Citrate Other (See Comments)    Made her feel "loopy". (Norflex) muscle relaxant  . Penicillins Rash    Has patient had a PCN reaction causing immediate rash, facial/tongue/throat swelling, SOB or lightheadedness with hypotension: Unknown Has patient had a PCN reaction causing severe rash involving mucus membranes or skin necrosis: Unknown Has patient had a PCN reaction that required hospitalization: No Has patient had a PCN reaction occurring within the last 10 years: No Childhood reaction If all of the above answers are "NO", then may proceed with Cephalosporin use.   . Tramadol Other (See Comments)    Makes her feel very tired and loopy    Current Medications: Current Outpatient Medications  Medication Sig Dispense Refill  . Calcium Carbonate-Vitamin D (CALCIUM 600+D) 600-400 MG-UNIT tablet Take 2 tablets by mouth daily.    . ciprofloxacin (CIPRO) 500 MG  tablet Take 500 mg by mouth 2 (two) times daily.    . clonazePAM (KLONOPIN) 1 MG tablet Take 1 mg by mouth at bedtime.    . diclofenac (VOLTAREN) 75 MG EC tablet Take by mouth.    . diphenhydrAMINE-APAP, sleep, (TYLENOL PM EXTRA STRENGTH PO) Take 2 tablets by mouth at bedtime.     . docusate sodium (COLACE) 100 MG capsule Take 1 capsule (100 mg total) by mouth daily as needed. (Patient taking differently: Take 200-300 mg by mouth daily as needed (for constipation.). ) 30 capsule 2  . estradiol (ESTRACE) 2 MG tablet Take 2 mg by mouth daily.  11  . glimepiride (AMARYL) 1 MG tablet Take 1 mg by mouth daily.    Marland Kitchen lisinopril (PRINIVIL,ZESTRIL) 10 MG tablet Take 10 mg by mouth daily.    . metFORMIN (GLUCOPHAGE) 500 MG tablet Take 500-1,000 mg by mouth See admin instructions. Take 1 tablet (500 mg) in the morning & take 2 tablets (1000 mg) by mouth in the evening.    . Multiple Vitamin (MULTIVITAMIN WITH MINERALS) TABS tablet Take 1 tablet by mouth daily.    Marland Kitchen oxyCODONE-acetaminophen (PERCOCET/ROXICET) 5-325 MG tablet Take 1 tablet by mouth every 4 (  four) hours as needed for severe pain. 30 tablet 0  . pantoprazole (PROTONIX) 40 MG tablet Take 40 mg by mouth daily before breakfast.  11  . phentermine (ADIPEX-P) 37.5 MG tablet Take 37.5 mg by mouth daily before breakfast.   0  . Potassium 99 MG TABS Take 99 mg by mouth daily.     . sertraline (ZOLOFT) 50 MG tablet Take 50 mg by mouth daily.    Marland Kitchen triamcinolone cream (KENALOG) 0.1 % Apply 1 application topically 2 (two) times daily as needed (for rash).      No current facility-administered medications for this visit.     Review of Systems  Constitutional: Positive for weight loss (4 pound). Negative for diaphoresis, fever and malaise/fatigue.       Feels great.  HENT: Negative for nosebleeds and sore throat.        Chronic hoarse voice.  Eyes: Negative.   Respiratory: Negative.  Negative for cough, hemoptysis, sputum production and shortness of  breath.   Cardiovascular: Negative.  Negative for chest pain, palpitations, orthopnea, leg swelling and PND.  Gastrointestinal: Positive for heartburn (on PPI therapy - improved). Negative for abdominal pain, blood in stool, constipation, diarrhea, melena, nausea and vomiting.  Genitourinary: Negative for dysuria, frequency, hematuria and urgency.  Musculoskeletal: Positive for back pain (+) herniated disc and joint pain (+) shoulder arthropathy. Negative for falls and myalgias.       Scoliosis  Skin: Negative for itching and rash.       Lower abdominal surgical wound healing well with no signs of infection. Staples removed on 03/12/2018. (+) intertriginous (sub-pannus) moisture noted.   Neurological: Negative for dizziness, tremors, weakness and headaches.       Chronic balance issues - improved  Endo/Heme/Allergies: Negative.  Does not bruise/bleed easily.  Psychiatric/Behavioral: Negative.  Negative for depression, memory loss and suicidal ideas. The patient is not nervous/anxious and does not have insomnia.   All other systems reviewed and are negative.  Performance status (ECOG): 1 - Symptomatic but completely ambulatory  Vital Signs BP 105/69 (BP Location: Left Arm, Patient Position: Sitting)   Pulse 81   Temp (!) 94.4 F (34.7 C) (Tympanic)   Resp 18   Wt 152 lb 12.5 oz (69.3 kg)   BMI 31.93 kg/m   Physical Exam  Constitutional: She is oriented to person, place, and time and well-developed, well-nourished, and in no distress.  HENT:  Head: Normocephalic and atraumatic.  Shoulder length frosted hair. Hoarse voice quality.  Eyes: Pupils are equal, round, and reactive to light. EOM are normal. No scleral icterus.  Glasses.  Blue eyes  Neck: Normal range of motion. Neck supple. No tracheal deviation present. No thyromegaly present.  Cardiovascular: Normal rate, regular rhythm and normal heart sounds. Exam reveals no gallop and no friction rub.  No murmur heard. Pulmonary/Chest:  Effort normal and breath sounds normal. No respiratory distress. She has no wheezes. She has no rales.  Abdominal: Soft. Bowel sounds are normal. She exhibits no distension. There is no tenderness.    Musculoskeletal: Normal range of motion. She exhibits no edema or tenderness.  Lymphadenopathy:    She has no cervical adenopathy.    She has no axillary adenopathy.       Right: No inguinal and no supraclavicular adenopathy present.       Left: No inguinal and no supraclavicular adenopathy present.  Neurological: She is alert and oriented to person, place, and time.  Skin: Skin is warm and dry. No  rash noted. No erythema.  Psychiatric: Mood, affect and judgment normal.  Nursing note and vitals reviewed.   No visits with results within 3 Day(s) from this visit.  Latest known visit with results is:  Admission on 02/20/2018, Discharged on 02/22/2018  Component Date Value Ref Range Status  . ABO/RH(D) 02/20/2018    Final                   Value:A POS Performed at [facility], [address], [place], [place] [address]   . Glucose-Capillary 02/20/2018 125* 70 - 99 mg/dL Final  . SURGICAL PATHOLOGY 02/20/2018    Final                   Value:Surgical Pathology CASE: [id number] PATIENT: Haley Jones Surgical Pathology Report     SPECIMEN SUBMITTED: A. 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 SMALL BOWEL MASS; RESECTION: - NODULAR AND CYSTIC AREA OF ENDOMETRIOSIS MEASURING 3.6 CM AND INVOLVING SEROSA OF SMALL INTESTINE. - NEGATIVE FOR DYSPLASIA AND MALIGNANCY.  GROSS DESCRIPTION: A. Labeled: Small bowel mass Received: In formalin Specimens received: Small bowel Measurements: U-shaped, 8 cm long up to 2 cm in diameter Specimen  Integrity: Intact Orientation: Mesenteric margin-orange, free cut surface on the mass-black and remaining external surface around the mass-blue External surface: Centrally there is a 3.6 x 3.0 x 2.6 cm nodule with fibrous adhesion and retrac                         tion of the bowel on the side opposite the mesentery Description: Opening the specimen the mucosa overlying the mass is folded tan to green with no grossly identified communication with the mass.  Sectioning the mass, it is cystic and contains chocolate brown material with a fibrous thick wall up to 1.2 cm.  The mass is 1.6 and 1.1 cm from stapled margins and 4 cm from mesenteric margin. Lymph nodes: None grossly identified  Block Summary: 1-2 - representative perpendicular stapled margins with mass and perpendicular mesenteric margin in cassette one 3-4- one full-thickness section of the mass divided 5 - additional representative section of the mass with the bowel 6 - one additional section of mass    Final Diagnosis performed by Quay Burow, MD.   Electronically signed 02/22/2018 3:35:02PM The electronic signature indicates that the named Attending Pathologist has evaluated the specimen  Technical component performed at [facility], [address] [place], [place] Lab: [phone] Dir: Rush Farmer, MD, MMM  Professional component performed at [facility], [facility], [place], [place], [place] [address] Lab: [phone] Dir: Dellia Nims. Rubinas, MD   . Glucose-Capillary 02/20/2018 211* 70 - 99 mg/dL Final  . HIV Screen 4th Generation wRfx 02/21/2018 Non Reactive  Non Reactive Final   Comment: (NOTE) Performed At: [facility] [place], [place] [id number] Rush Farmer MD CB:[id number]   . Sodium 02/21/2018 138  135 - 145 mmol/L Final  . Potassium 02/21/2018 4.5  3.5 - 5.1 mmol/L Final  . Chloride 02/21/2018 108  98 - 111 mmol/L Final    Please note change in reference range.  Marland Kitchen  CO2 02/21/2018 24  22 - 32 mmol/L Final  . Glucose, Bld 02/21/2018 205* 70 - 99 mg/dL Final   Please note change in reference range.  . BUN 02/21/2018 15  6 - 20 mg/dL Final   Please note change in reference range.  . Creatinine, Ser 02/21/2018 0.81  0.44 - 1.00 mg/dL Final  . Calcium 02/21/2018 7.8* 8.9 - 10.3 mg/dL Final  . GFR calc non Af Amer 02/21/2018 >60  >60 mL/min Final  . GFR calc Af Amer 02/21/2018 >60  >60 mL/min Final   Comment: (NOTE) The eGFR has been calculated using the CKD EPI equation. This calculation has not been validated in all clinical situations. eGFR's persistently <60 mL/min signify possible Chronic Kidney Disease.   Georgiann Hahn gap 02/21/2018 6  5 - 15 Final   Performed at [facility], [address] [place], [place] [address]  . WBC 02/21/2018 8.6  3.6 - 11.0 K/uL Final  . RBC 02/21/2018 3.05* 3.80 - 5.20 MIL/uL Final  . Hemoglobin 02/21/2018 10.2* 12.0 - 16.0 g/dL Final  . HCT 02/21/2018 29.4* 35.0 - 47.0 % Final  . MCV 02/21/2018 96.5  80.0 - 100.0 fL Final  . MCH 02/21/2018 33.4  26.0 - 34.0 pg Final  . MCHC 02/21/2018 34.6  32.0 - 36.0 g/dL Final  . RDW 02/21/2018 13.1  11.5 - 14.5 % Final  . Platelets 02/21/2018 173  150 - 440 K/uL Final   Performed at [facility], [address], [place], [place] [address]  . Sodium 02/22/2018 140  135 - 145 mmol/L Final  . Potassium 02/22/2018 4.1  3.5 - 5.1 mmol/L Final  . Chloride 02/22/2018 110  98 - 111 mmol/L Final   Please note change in reference range.  . CO2 02/22/2018 25  22 - 32 mmol/L Final  . Glucose, Bld 02/22/2018 169* 70 - 99 mg/dL Final   Please note change in reference range.  . BUN 02/22/2018 8  6 - 20 mg/dL Final   Please note change in reference range.  . Creatinine, Ser 02/22/2018 0.63  0.44 - 1.00 mg/dL Final  . Calcium 02/22/2018 8.1* 8.9 - 10.3 mg/dL Final  . GFR calc non Af Amer 02/22/2018 >60  >60 mL/min Final  . GFR calc  Af Amer 02/22/2018 >60  >60 mL/min Final   Comment: (NOTE) The eGFR has been calculated using the CKD EPI equation. This calculation has not been validated in all clinical situations. eGFR's persistently <60 mL/min signify possible Chronic Kidney Disease.   Georgiann Hahn gap 02/22/2018 5  5 - 15 Final   Performed at [facility], [address] [place], [place] [address]  . WBC 02/22/2018 5.3  3.6 - 11.0 K/uL Final  . RBC 02/22/2018 2.99* 3.80 - 5.20 MIL/uL Final  . Hemoglobin 02/22/2018 9.8* 12.0 - 16.0 g/dL Final  . HCT 02/22/2018 28.9* 35.0 - 47.0 % Final  . MCV 02/22/2018 96.7  80.0 - 100.0 fL Final  . MCH 02/22/2018 32.7  26.0 - 34.0 pg Final  . MCHC 02/22/2018 33.8  32.0 - 36.0 g/dL Final  . RDW 02/22/2018 12.8  11.5 - 14.5 % Final  . Platelets 02/22/2018 169  150 - 440 K/uL Final   Performed at [facility], [address], [place], [place] [address]  . Glucose-Capillary 02/22/2018 154* 70 - 99 mg/dL Final  . Comment 1 02/22/2018 Notify RN   Final  . Glucose-Capillary 02/22/2018 207* 70 - 99 mg/dL Final  .  Comment 1 02/22/2018 Notify RN   Final  . Glucose-Capillary 02/22/2018 124* 70 - 99 mg/dL Final    Assessment:  Haley Jones is a 62 y.o. female with a lesion in the right hemipelvis.  Diagnostic laparoscopy with small bowel resection on 02/20/2018 revealed a 3.6 cm nodular and cystic area of endometriosis involving serosa of the small intestine.  Sample was negative for high-grade dysplasia and malignancy.    She presented with a 3 month history of RLQ pain.  She had lost 30-35 pounds in the past 6 months intentionally.  CA125 was 125.9 and CEA 1.1 on 11/28/2017.  Abdomen and pelvic CT on 08/15/2017 revealed no acute intra-abdominal or intrapelvic abnormalities.  There was degenerative disc and facet disease changes of the lower lumbar spine with neural foraminal stenoses at L4-L5 and L5-S1 with question of a calcified disc fragment posterior to the L5  vertebral body on RIGHT.   Abdominal and pelvic CT on 11/28/2017 revealed an unusual thick-walled peripherally enhancing 3.7 cm cystic lesion at the right upper hemipelvis. This had increased in size since 08/2017, with minimal surrounding soft tissue inflammation. This may reflect a small bowel loop, in which case it raises concern for small bowel lymphoma.  A right adnexal cystic lesion was considered less likely.  Colon was largely filled with stool, raising concern for mild constipation.  Abdomen and pelvis CT on 12/03/2017 revealed a 2.5 cm right adnexal cystic structure, slightly enlarged since 2017.  There were no acute abdominal findings or lymphadenopathy.  Pelvic ultrasound on 01/25/2018 revealed a 3.8 x 2.8 x 3.8 cm complex hypoechoic cystic lesion within the right adnexa, corresponding with lesion seen on prior CTs. Finding was indeterminate, but appeared to demonstrate peripheral solid component with associated vascularity.  She is s/p TAH/BSO in 2008 for fibroids and menorrhagia.  EGD on 070/09/2017 revealed esophageal mucosal changes associated with known short segment Barrett's esophagus.  Pathology negative for malignancy and dysplasia.  Colonoscopy on 02/12/2018 revealed internal grade 1 hemorrhoids.  No biopsies taken.  Symptomatically, she is doing well.  Exam reveals healing laparotomy incision.  Plan: 1. Review  surgical course to evaluate RIGHT adnexal mass:  Pathology showed a 3.6 cm nodular and cystic area of endometriosis involving the serosa of the small intestine.    No evidence of malignancy. 2. Review EGD and colonoscopy:   EGD showed mucosal changes consistent with Barrett's esophagus.    Colonoscopy negative.  Discuss regular follow-up with GI. 3. RTC prn.   Honor Loh, NP  03/13/2018, 11:07 AM   I saw and evaluated the patient, participating in the key portions of the service and reviewing pertinent diagnostic studies and records.  I reviewed the nurse  practitioner's note and agree with the findings and the plan.  The assessment and plan were discussed with the patient.  Several questions were asked by the patient and answered.   Nolon Stalls, MD 03/13/2018,11:07 AM

## 2018-03-12 NOTE — Patient Instructions (Addendum)
Please continue apply a dry gauze.  GENERAL POST-OPERATIVE PATIENT INSTRUCTIONS   WOUND CARE INSTRUCTIONS:  Keep a dry clean dressing on the wound if there is drainage. The initial bandage may be removed after 24 hours.  Once the wound has quit draining you may leave it open to air.  If clothing rubs against the wound or causes irritation and the wound is not draining you may cover it with a dry dressing during the daytime.  Try to keep the wound dry and avoid ointments on the wound unless directed to do so.  If the wound becomes bright red and painful or starts to drain infected material that is not clear, please contact your physician immediately.  If the wound is mildly pink and has a thick firm ridge underneath it, this is normal, and is referred to as a healing ridge.  This will resolve over the next 4-6 weeks.  BATHING: You may shower if you have been informed of this by your surgeon. However, Please do not submerge in a tub, hot tub, or pool until incisions are completely sealed or have been told by your surgeon that you may do so.  DIET:  You may eat any foods that you can tolerate.  It is a good idea to eat a high fiber diet and take in plenty of fluids to prevent constipation.  If you do become constipated you may want to take a mild laxative or take ducolax tablets on a daily basis until your bowel habits are regular.  Constipation can be very uncomfortable, along with straining, after recent surgery.  ACTIVITY:  You are encouraged to cough and deep breath or use your incentive spirometer if you were given one, every 15-30 minutes when awake.  This will help prevent respiratory complications and low grade fevers post-operatively if you had a general anesthetic.  You may want to hug a pillow when coughing and sneezing to add additional support to the surgical area, if you had abdominal or chest surgery, which will decrease pain during these times.  You are encouraged to walk and engage in light  activity for the next two weeks.  You should not lift more than 40 pounds, until 04/09/2018 as it could put you at increased risk for complications.  Twenty pounds is roughly equivalent to a plastic bag of groceries. At that time- Listen to your body when lifting, if you have pain when lifting, stop and then try again in a few days. Soreness after doing exercises or activities of daily living is normal as you get back in to your normal routine.  MEDICATIONS:  Try to take narcotic medications and anti-inflammatory medications, such as tylenol, ibuprofen, naprosyn, etc., with food.  This will minimize stomach upset from the medication.  Should you develop nausea and vomiting from the pain medication, or develop a rash, please discontinue the medication and contact your physician.  You should not drive, make important decisions, or operate machinery when taking narcotic pain medication.  SUNBLOCK Use sun block to incision area over the next year if this area will be exposed to sun. This helps decrease scarring and will allow you avoid a permanent darkened area over your incision.  QUESTIONS:  Please feel free to call our office if you have any questions, and we will be glad to assist you. [phone]

## 2018-03-12 NOTE — Progress Notes (Signed)
Surgical Clinic Progress/Follow-up Note   HPI:  62 y.o. Female presents to clinic for post-op follow-up just over 2 weeks s/p diagnostic laparoscopy with laparoscope-assisted open small bowel resection with primary anastomosis Rosana Hoes and gynecologic oncologist Dr. Fransisca Connors, 02/20/2018). Patient reports complete resolution of pre- and post-operative pain and has been tolerating regular diet with +flatus and normal BM's, denies N/V, fever/chills, CP, or SOB. She said she continues to take once daily Colace for improved management of her formerly chronic constipation.  Review of Systems:  Constitutional: denies fever/chills  Respiratory: denies shortness of breath, wheezing  Cardiovascular: denies chest pain, palpitations  Gastrointestinal: abdominal pain, N/V, and bowel function as per interval history Skin: Denies any other rashes or skin discolorations except post-surgical wounds as per interval history  Vital Signs:  BP (!) 89/69   Pulse 96   Temp 98.8 F (37.1 C) (Oral)   Ht 4\' 10"  (1.473 m)   Wt 154 lb (69.9 kg)   BMI 32.19 kg/m    Physical Exam:  Constitutional:  -- Obese body habitus  -- Awake, alert, and oriented x3  Pulmonary:  -- No crackles -- Equal breath sounds bilaterally -- Breathing non-labored at rest Cardiovascular:  -- S1, S2 present  -- No pericardial rubs  Gastrointestinal:  -- Soft and non-distended, non-tender, no guarding/rebound tenderness -- Post-surgical incisions all well-approximated without any peri-incisional erythema or drainage except infra-panus moisture associated peri-incisional excoriation -- No abdominal masses appreciated, pulsatile or otherwise  Musculoskeletal / Integumentary:  -- Wounds or skin discoloration: None appreciated except post-surgical incisions as described above (GI) -- Extremities: B/L UE and LE FROM, hands and feet warm, no edema   Pathology: SMALL BOWEL MASS; RESECTION (02/22/2018):  - NODULAR AND CYSTIC AREA OF  ENDOMETRIOSIS MEASURING 3.6 CM AND  INVOLVING SEROSA OF SMALL INTESTINE.  - NEGATIVE FOR DYSPLASIA AND MALIGNANCY.   Assessment:  62 y.o. yo Female with a problem list including...  Patient Active Problem List   Diagnosis Date Noted  . Pelvic mass in female   . Small bowel mass 02/20/2018  . Adnexal pain 01/30/2018  . Right lower quadrant abdominal mass 12/06/2017  . Weight loss 12/06/2017  . Hoarse 12/06/2017  . Lumbar disc disease 07/26/2017  . Diabetes mellitus type 2, controlled, without complications ([place]) 32/20/2542  . 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 Fransisca Connors, 02/20/2018) for small intestinal cystic mass.  Plan:              - advance diet as tolerated  - pathology results discussed and provided             - okay to submerge incisions under water (baths, swimming) prn             - no heavy lifting >40 lbs x 4 more weeks, after which may gradually resume all activities without restrictions             - apply sunblock particularly to incisions with sun exposure to reduce pigmentation of scars             - return to clinic as needed, instructed to call office if any questions or concerns  - will forward pathology results to patient's PMD and ob/gyn  All of the above recommendations were discussed with the patient and patient's husband, and all of patient's and family's questions were answered to  their expressed satisfaction.  -- Marilynne Drivers Rosana Hoes, MD, [place]: [facility] - Partnering for exceptional care. Office: [phone]

## 2018-03-13 ENCOUNTER — Encounter: Payer: Self-pay | Admitting: Hematology and Oncology

## 2018-03-13 ENCOUNTER — Inpatient Hospital Stay: Payer: BC Managed Care – PPO | Attending: Hematology and Oncology | Admitting: Hematology and Oncology

## 2018-03-13 VITALS — BP 105/69 | HR 81 | Temp 94.4°F | Resp 18 | Wt 152.8 lb

## 2018-03-13 DIAGNOSIS — I10 Essential (primary) hypertension: Secondary | ICD-10-CM | POA: Diagnosis not present

## 2018-03-13 DIAGNOSIS — R1903 Right lower quadrant abdominal swelling, mass and lump: Secondary | ICD-10-CM | POA: Diagnosis not present

## 2018-03-13 DIAGNOSIS — F418 Other specified anxiety disorders: Secondary | ICD-10-CM

## 2018-03-13 DIAGNOSIS — R634 Abnormal weight loss: Secondary | ICD-10-CM

## 2018-03-13 DIAGNOSIS — K2270 Barrett's esophagus without dysplasia: Secondary | ICD-10-CM | POA: Diagnosis not present

## 2018-03-13 DIAGNOSIS — Z79899 Other long term (current) drug therapy: Secondary | ICD-10-CM

## 2018-03-13 DIAGNOSIS — N809 Endometriosis, unspecified: Secondary | ICD-10-CM | POA: Insufficient documentation

## 2018-03-13 DIAGNOSIS — K219 Gastro-esophageal reflux disease without esophagitis: Secondary | ICD-10-CM

## 2018-03-13 DIAGNOSIS — E119 Type 2 diabetes mellitus without complications: Secondary | ICD-10-CM

## 2018-03-13 NOTE — Progress Notes (Signed)
Patient offers no complaints.  Patient had surgery to remove mass removed on 02-20-18.  Patient states it was endometriosis  wrapped around her intestines. (No cancer, per Dr. Rosana Hoes).

## 2018-03-22 ENCOUNTER — Telehealth: Payer: Self-pay

## 2018-03-22 NOTE — Telephone Encounter (Signed)
Patient called in stating that her umbilical was hard and tender at touch for the last two days. Patient denied fever, chills, nausea, vomiting, constipation and loose stools. She wanted to come in to see Dr. Rosana Hoes just to make sure that there was nothing wrong with her. Appointment was scheduled for 03/26/2018 at 9:15 AM.

## 2018-03-26 ENCOUNTER — Encounter: Payer: Self-pay | Admitting: Surgery

## 2018-03-26 ENCOUNTER — Ambulatory Visit (INDEPENDENT_AMBULATORY_CARE_PROVIDER_SITE_OTHER): Payer: BC Managed Care – PPO | Admitting: Surgery

## 2018-03-26 VITALS — BP 128/79 | HR 88 | Temp 97.7°F | Ht <= 58 in | Wt 162.0 lb

## 2018-03-26 DIAGNOSIS — Z4889 Encounter for other specified surgical aftercare: Secondary | ICD-10-CM

## 2018-03-26 NOTE — Progress Notes (Signed)
Surgical Clinic Progress/Follow-up Note   HPI:  62 y.o. Female presents to clinic for subsequent post-op follow-up >1 month s/p laparoscope-assisted small bowel resection for intestinal cystic mass, consistent on pathology with endometriosis Rosana Hoes and [place], 02/20/2018). Patient reports she was feeling well at the time of her recent follow-up, after which she noticed a mild-/moderately "sore" firm subcutaneous mass of her lower abdomen in between her 3 laparoscopic port sites (umbilical, LLQ, and RLQ) and her pfannenstiel incision, >4 - 5 cm away from any of her incisions. She otherwise denies any fever/chills, pain or erythema/drainage at any of her incision sites, denies any recent trauma to the site, and denies having noticed this sore, hard mass before last week. Her "soreness", however, has gradually and consistently continued to improve since last week, and she describes the mass seems smaller this week than last week. She continues to tolerate regular diet with +flatus and normal BM's, denies N/V, CP, or SOB.  Review of Systems:  Constitutional: denies fever/chills  Respiratory: denies shortness of breath, wheezing  Cardiovascular: denies chest pain, palpitations  Gastrointestinal: abdominal pain, N/V, and bowel function as per interval history Skin: Denies any other rashes or skin discolorations except post-surgical wounds as per interval history  Vital Signs:  BP 128/79   Pulse 88   Temp 97.7 F (36.5 C) (Oral)   Ht 4\' 10"  (1.473 m)   Wt 162 lb (73.5 kg)   BMI 33.86 kg/m    Physical Exam:  Constitutional:  -- Obese body habitus  -- Awake, alert, and oriented x3  Pulmonary:  -- No crackles -- Equal breath sounds bilaterally -- Breathing non-labored at rest Cardiovascular:  -- S1, S2 present  -- No pericardial rubs  Gastrointestinal:  -- Soft and non-distended, non-tender to palpation, no guarding/rebound tenderness -- Post-surgical incisions all well-approximated  without any peri-incisional erythema or drainage -- There appears to be a minimally tender to palpation firm and mobile irregular 4+ cm subcutaneous mass, most likely a hematoma that appears to have tracked subcutaneously, possibly from pfannenstiel incision, though this too is several cm from the nearest edge of subcutaneous mass >1 month s/p recent laparoscope-assisted small bowel resection -- No other abdominal masses appreciated, pulsatile or otherwise  Musculoskeletal / Integumentary:  -- Wounds or skin discoloration: None appreciated except post-surgical incisions as described above (GI) -- Extremities: B/L UE and LE FROM, hands and feet warm, no edema   Assessment:  62 y.o. yo Female with a problem list including...  Patient Active Problem List   Diagnosis Date Noted  . Weight loss 12/06/2017  . Hoarse 12/06/2017  . Lumbar disc disease 07/26/2017  . Diabetes mellitus type 2, controlled, without complications ([place]) 40/98/1191  . 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  Plan:              - continue to advance diet as tolerated              - okay to submerge incisions under water (baths, swimming) prn             - discussed with patient further imaging, scheduled follow-up, or follow-up as needed  - patient and her husband express wishes to follow-up  as needed             - instructed to call office if any questions or concerns  - return to clinic as needed  All of the above recommendations were discussed with the patient and patient's family, and all of patient's and family's questions were  answered to their expressed satisfaction.  -- Marilynne Drivers Rosana Hoes, MD, [place]: [facility] - Partnering for exceptional care. Office: [phone]

## 2018-03-26 NOTE — Patient Instructions (Signed)
Please call our office if you have questions or concerns.   

## 2018-04-02 ENCOUNTER — Other Ambulatory Visit: Payer: Self-pay | Admitting: Obstetrics and Gynecology

## 2018-04-02 DIAGNOSIS — Z1231 Encounter for screening mammogram for malignant neoplasm of breast: Secondary | ICD-10-CM

## 2018-04-15 DIAGNOSIS — K2270 Barrett's esophagus without dysplasia: Secondary | ICD-10-CM | POA: Insufficient documentation

## 2018-04-18 ENCOUNTER — Ambulatory Visit
Admission: RE | Admit: 2018-04-18 | Discharge: 2018-04-18 | Disposition: A | Discharge status: Home / Self Care | Payer: BC Managed Care – PPO | Source: Ambulatory Visit | Attending: Obstetrics and Gynecology | Admitting: Obstetrics and Gynecology

## 2018-04-18 DIAGNOSIS — Z1231 Encounter for screening mammogram for malignant neoplasm of breast: Secondary | ICD-10-CM | POA: Insufficient documentation

## 2019-01-29 ENCOUNTER — Emergency Department: Payer: BC Managed Care – PPO

## 2019-01-29 ENCOUNTER — Other Ambulatory Visit: Payer: Self-pay

## 2019-01-29 ENCOUNTER — Inpatient Hospital Stay: Payer: BC Managed Care – PPO

## 2019-01-29 ENCOUNTER — Encounter: Payer: Self-pay | Admitting: Emergency Medicine

## 2019-01-29 ENCOUNTER — Inpatient Hospital Stay
Admission: EM | Admit: 2019-01-29 | Discharge: 2019-02-01 | DRG: 390 | Disposition: A | Discharge status: Home / Self Care | Payer: BC Managed Care – PPO | Attending: Surgery | Admitting: Surgery

## 2019-01-29 DIAGNOSIS — K2270 Barrett's esophagus without dysplasia: Secondary | ICD-10-CM | POA: Diagnosis present

## 2019-01-29 DIAGNOSIS — K219 Gastro-esophageal reflux disease without esophagitis: Secondary | ICD-10-CM | POA: Diagnosis present

## 2019-01-29 DIAGNOSIS — Z6838 Body mass index (BMI) 38.0-38.9, adult: Secondary | ICD-10-CM

## 2019-01-29 DIAGNOSIS — K56600 Partial intestinal obstruction, unspecified as to cause: Secondary | ICD-10-CM | POA: Diagnosis not present

## 2019-01-29 DIAGNOSIS — Z20828 Contact with and (suspected) exposure to other viral communicable diseases: Secondary | ICD-10-CM | POA: Diagnosis present

## 2019-01-29 DIAGNOSIS — F419 Anxiety disorder, unspecified: Secondary | ICD-10-CM | POA: Diagnosis present

## 2019-01-29 DIAGNOSIS — Z880 Allergy status to penicillin: Secondary | ICD-10-CM | POA: Diagnosis not present

## 2019-01-29 DIAGNOSIS — Z888 Allergy status to other drugs, medicaments and biological substances status: Secondary | ICD-10-CM | POA: Diagnosis not present

## 2019-01-29 DIAGNOSIS — G8929 Other chronic pain: Secondary | ICD-10-CM | POA: Diagnosis present

## 2019-01-29 DIAGNOSIS — E119 Type 2 diabetes mellitus without complications: Secondary | ICD-10-CM | POA: Diagnosis present

## 2019-01-29 DIAGNOSIS — K5651 Intestinal adhesions [bands], with partial obstruction: Secondary | ICD-10-CM | POA: Diagnosis present

## 2019-01-29 DIAGNOSIS — F329 Major depressive disorder, single episode, unspecified: Secondary | ICD-10-CM | POA: Diagnosis present

## 2019-01-29 DIAGNOSIS — Z90710 Acquired absence of both cervix and uterus: Secondary | ICD-10-CM | POA: Diagnosis not present

## 2019-01-29 DIAGNOSIS — E860 Dehydration: Secondary | ICD-10-CM | POA: Diagnosis present

## 2019-01-29 DIAGNOSIS — I10 Essential (primary) hypertension: Secondary | ICD-10-CM | POA: Diagnosis present

## 2019-01-29 DIAGNOSIS — E669 Obesity, unspecified: Secondary | ICD-10-CM | POA: Diagnosis present

## 2019-01-29 DIAGNOSIS — Z9049 Acquired absence of other specified parts of digestive tract: Secondary | ICD-10-CM | POA: Diagnosis not present

## 2019-01-29 DIAGNOSIS — R110 Nausea: Secondary | ICD-10-CM | POA: Diagnosis not present

## 2019-01-29 DIAGNOSIS — Z79899 Other long term (current) drug therapy: Secondary | ICD-10-CM

## 2019-01-29 DIAGNOSIS — Z7984 Long term (current) use of oral hypoglycemic drugs: Secondary | ICD-10-CM

## 2019-01-29 DIAGNOSIS — Z885 Allergy status to narcotic agent status: Secondary | ICD-10-CM | POA: Diagnosis not present

## 2019-01-29 DIAGNOSIS — Z0189 Encounter for other specified special examinations: Secondary | ICD-10-CM

## 2019-01-29 DIAGNOSIS — K56609 Unspecified intestinal obstruction, unspecified as to partial versus complete obstruction: Secondary | ICD-10-CM | POA: Diagnosis present

## 2019-01-29 LAB — URINALYSIS, COMPLETE (UACMP) WITH MICROSCOPIC
Glucose, UA: NEGATIVE mg/dL
Hgb urine dipstick: NEGATIVE
Ketones, ur: 80 mg/dL — AB
Leukocytes,Ua: NEGATIVE
Nitrite: NEGATIVE
Protein, ur: 100 mg/dL — AB
Specific Gravity, Urine: 1.028 (ref 1.005–1.030)
pH: 5 (ref 5.0–8.0)

## 2019-01-29 LAB — COMPREHENSIVE METABOLIC PANEL
ALT: 17 U/L (ref 0–44)
AST: 15 U/L (ref 15–41)
Albumin: 4 g/dL (ref 3.5–5.0)
Alkaline Phosphatase: 56 U/L (ref 38–126)
Anion gap: 13 (ref 5–15)
BUN: 15 mg/dL (ref 8–23)
CO2: 24 mmol/L (ref 22–32)
Calcium: 9.2 mg/dL (ref 8.9–10.3)
Chloride: 100 mmol/L (ref 98–111)
Creatinine, Ser: 1.11 mg/dL — ABNORMAL HIGH (ref 0.44–1.00)
GFR calc Af Amer: >60 mL/min (ref >60)
GFR calc non Af Amer: 54 mL/min — ABNORMAL LOW (ref >60)
Glucose, Bld: 179 mg/dL — ABNORMAL HIGH (ref 70–99)
Potassium: 4.4 mmol/L (ref 3.5–5.1)
Sodium: 137 mmol/L (ref 135–145)
Total Bilirubin: 0.7 mg/dL (ref 0.3–1.2)
Total Protein: 7.2 g/dL (ref 6.5–8.1)

## 2019-01-29 LAB — CBC
HCT: 36.6 % (ref 36.0–46.0)
Hemoglobin: 12.5 g/dL (ref 12.0–15.0)
MCH: 32.1 pg (ref 26.0–34.0)
MCHC: 34.2 g/dL (ref 30.0–36.0)
MCV: 93.8 fL (ref 80.0–100.0)
Platelets: 217 10*3/uL (ref 150–400)
RBC: 3.9 MIL/uL (ref 3.87–5.11)
RDW: 11.9 % (ref 11.5–15.5)
WBC: 7.4 10*3/uL (ref 4.0–10.5)
nRBC: 0 % (ref 0.0–0.2)

## 2019-01-29 LAB — LIPASE, BLOOD: Lipase: 23 U/L (ref 11–51)

## 2019-01-29 LAB — SARS CORONAVIRUS 2 BY RT PCR (HOSPITAL ORDER, PERFORMED IN ~~LOC~~ HOSPITAL LAB): SARS Coronavirus 2: NEGATIVE

## 2019-01-29 MED ORDER — ALUM & MAG HYDROXIDE-SIMETH 200-200-20 MG/5ML PO SUSP
30.0000 mL | Freq: Once | ORAL | Status: AC
  Administered 2019-01-29: 20:00:00 30 mL via ORAL
  Filled 2019-01-29: qty 30

## 2019-01-29 MED ORDER — ENOXAPARIN SODIUM 40 MG/0.4ML ~~LOC~~ SOLN
40.0000 mg | SUBCUTANEOUS | Status: DC
  Administered 2019-01-29 – 2019-01-31 (×3): 40 mg via SUBCUTANEOUS
  Filled 2019-01-29 (×3): qty 0.4

## 2019-01-29 MED ORDER — FAMOTIDINE IN NACL 20-0.9 MG/50ML-% IV SOLN
20.0000 mg | Freq: Once | INTRAVENOUS | Status: AC
  Administered 2019-01-29: 20 mg via INTRAVENOUS
  Filled 2019-01-29: qty 50

## 2019-01-29 MED ORDER — KETOROLAC TROMETHAMINE 30 MG/ML IJ SOLN
30.0000 mg | Freq: Four times a day (QID) | INTRAMUSCULAR | Status: DC | PRN
  Administered 2019-01-30 – 2019-02-01 (×2): 30 mg via INTRAVENOUS
  Filled 2019-01-29 (×2): qty 1

## 2019-01-29 MED ORDER — INSULIN ASPART 100 UNIT/ML ~~LOC~~ SOLN
0.0000 [IU] | SUBCUTANEOUS | Status: DC
  Administered 2019-01-30: 3 [IU] via SUBCUTANEOUS
  Filled 2019-01-29 (×2): qty 1

## 2019-01-29 MED ORDER — LIDOCAINE VISCOUS HCL 2 % MT SOLN
15.0000 mL | Freq: Once | OROMUCOSAL | Status: AC
  Administered 2019-01-29: 15 mL via ORAL
  Filled 2019-01-29: qty 15

## 2019-01-29 MED ORDER — LACTATED RINGERS IV SOLN
125.0000 mL/h | INTRAVENOUS | Status: DC
  Administered 2019-01-29 – 2019-02-01 (×8): 125 mL/h via INTRAVENOUS

## 2019-01-29 MED ORDER — MENTHOL 3 MG MT LOZG
1.0000 | LOZENGE | OROMUCOSAL | Status: DC | PRN
  Administered 2019-01-29 – 2019-01-31 (×9): 3 mg via ORAL
  Filled 2019-01-29 (×2): qty 9

## 2019-01-29 MED ORDER — ONDANSETRON HCL 4 MG/2ML IJ SOLN: 4.0000 mg | Freq: Four times a day (QID) | INTRAMUSCULAR | Status: DC | PRN

## 2019-01-29 MED ORDER — PANTOPRAZOLE SODIUM 40 MG IV SOLR
40.0000 mg | Freq: Every day | INTRAVENOUS | Status: DC
  Administered 2019-01-29 – 2019-01-31 (×3): 40 mg via INTRAVENOUS
  Filled 2019-01-29 (×3): qty 40

## 2019-01-29 MED ORDER — IOHEXOL 300 MG/ML  SOLN
100.0000 mL | Freq: Once | INTRAMUSCULAR | Status: AC | PRN
  Administered 2019-01-29: 100 mL via INTRAVENOUS

## 2019-01-29 MED ORDER — HYDROMORPHONE HCL 1 MG/ML IJ SOLN: 0.5000 mg | INTRAMUSCULAR | Status: DC | PRN

## 2019-01-29 MED ORDER — ONDANSETRON 4 MG PO TBDP: 4.0000 mg | ORAL_TABLET | Freq: Four times a day (QID) | ORAL | Status: DC | PRN

## 2019-01-29 MED ORDER — SODIUM CHLORIDE 0.9% FLUSH
3.0000 mL | Freq: Once | INTRAVENOUS | Status: AC
  Administered 2019-01-29: 20:00:00 3 mL via INTRAVENOUS

## 2019-01-29 MED ORDER — SODIUM CHLORIDE 0.9 % IV BOLUS
1000.0000 mL | Freq: Once | INTRAVENOUS | Status: AC
  Administered 2019-01-29: 1000 mL via INTRAVENOUS

## 2019-01-29 MED ORDER — HYDRALAZINE HCL 20 MG/ML IJ SOLN: 10.0000 mg | Freq: Four times a day (QID) | INTRAMUSCULAR | Status: DC | PRN

## 2019-01-29 NOTE — ED Notes (Signed)
ED Provider at bedside. 

## 2019-01-29 NOTE — ED Provider Notes (Signed)
[facility] Emergency Department Provider Note  ____________________________________________   First MD Initiated Contact with Patient 01/29/19 1850     (approximate)  I have reviewed the triage vital signs and the nursing notes.   HISTORY  Chief Complaint Abdominal Pain    HPI Haley Jones is a 63 y.o. female with past medical history as below including history of endometriosis here with lower abdominal pain, now epigastric.  The patient states that for the last week, she has had progressively worsening aching, cramp-like, primarily periumbilical but now epigastric abdominal pain.  She had associated nausea, but no vomiting.  She is had loss of appetite.  She has a history of Barrett's and is been taking her antacids.  Denies any active vomiting.  Symptoms do seem to be worse with eating.  No alleviating factors.  No fevers or chills.  No urinary symptoms.  She is status post hysterectomy.        Past Medical History:  Diagnosis Date   Adnexal mass    Anxiety    Chronic back pain    Depression    Diabetes mellitus without complication ([place])    Endometriosis 03/12/2018   GERD (gastroesophageal reflux disease)    Heart murmur 02/2018   undetected until she was an adult. 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    Past Surgical History:  Procedure Laterality Date   ABDOMINAL HYSTERECTOMY  2008   ovaries also   CESAREAN SECTION  1994   COLONOSCOPY     COLONOSCOPY WITH PROPOFOL N/A 02/12/2018   Procedure:  COLONOSCOPY WITH PROPOFOL;  Surgeon: Toledo, Benay Pike, MD;  Location: [facility] ENDOSCOPY;  Service: Gastroenterology;  Laterality: N/A;   ESOPHAGOGASTRODUODENOSCOPY (EGD) WITH PROPOFOL N/A 02/12/2018   Procedure: ESOPHAGOGASTRODUODENOSCOPY (EGD) WITH PROPOFOL;  Surgeon: Toledo, Benay Pike, MD;  Location: [facility] ENDOSCOPY;  Service: Gastroenterology;  Laterality: N/A;   EYE SURGERY Left 1973   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 Authorizing Provider  Calcium Carbonate-Vitamin D (CALCIUM 600+D) 600-400 MG-UNIT tablet Take 2 tablets by mouth daily.    [provider]  clonazePAM (KLONOPIN) 1 MG tablet Take 1 mg by mouth at bedtime. 07/17/17   [provider]  diphenhydrAMINE-APAP, sleep, (TYLENOL PM EXTRA STRENGTH PO) Take 2 tablets by mouth at bedtime.     [provider]  estradiol (ESTRACE) 2 MG tablet Take 2 mg by mouth daily. 11/04/17   [provider]  glimepiride (AMARYL) 1 MG tablet Take 1 mg by mouth daily. 08/08/17   [provider]  lisinopril (PRINIVIL,ZESTRIL) 10 MG tablet Take 10 mg by mouth daily. 02/13/17   [provider]  metFORMIN (GLUCOPHAGE) 500 MG tablet Take 500-1,000 mg by mouth See admin instructions. Take 1 tablet (  500 mg) in the morning & take 2 tablets (1000 mg) by mouth in the evening. 07/11/17   [provider]  Multiple Vitamin (MULTIVITAMIN WITH MINERALS) TABS tablet Take 1 tablet by mouth daily.    [provider]  pantoprazole (PROTONIX) 40 MG tablet Take 40 mg by mouth daily before breakfast. 01/28/18   [provider]  phentermine (ADIPEX-P) 37.5 MG tablet Take 37.5 mg by  mouth daily before breakfast.  10/09/17   [provider]  Potassium 99 MG TABS Take 99 mg by mouth daily.     [provider]  sertraline (ZOLOFT) 50 MG tablet Take 50 mg by mouth daily. 07/08/17   [provider]  simvastatin (ZOCOR) 20 MG tablet Take by mouth. 03/14/18 03/14/19  [provider]    Allergies Etodolac, Hydrocodone-acetaminophen, Orphenadrine citrate, Penicillins, and Tramadol  Family History  Problem Relation Age of Onset   Melanoma Mother    Lung cancer Father    Ovarian cancer Maternal Grandmother    Breast cancer Neg Hx     Social History Social History   Tobacco Use   Smoking status: Never Smoker   Smokeless tobacco: Never Used  Substance Use Topics   Alcohol use: No    Comment: rare   Drug use: No    Review of Systems  Review of Systems  Constitutional: Positive for appetite change and fatigue. Negative for chills and fever.  HENT: Negative for congestion and rhinorrhea.   Eyes: Negative for visual disturbance.  Respiratory: Negative for cough, chest tightness and shortness of breath.   Cardiovascular: Negative for chest pain and leg swelling.  Gastrointestinal: Positive for abdominal pain and nausea. Negative for diarrhea and vomiting.  Genitourinary: Negative for dysuria.  Musculoskeletal: Negative for neck pain and neck stiffness.  Skin: Negative for rash.  Allergic/Immunologic: Negative for immunocompromised state.  Neurological: Positive for weakness.  All other systems reviewed and are negative.    ____________________________________________  PHYSICAL EXAM:      VITAL SIGNS: ED Triage Vitals  Enc Vitals Group     BP 01/29/19 1523 129/78     Pulse Rate 01/29/19 1523 94     Resp 01/29/19 1523 16     Temp 01/29/19 1523 98.6 F (37 C)     Temp Source 01/29/19 1523 Oral     SpO2 01/29/19 1523 97 %     Weight 01/29/19 1524 183 lb (83 kg)     Height 01/29/19 1524 4\' 9"  (1.448 m)     Head  Circumference --      Peak Flow --      Pain Score 01/29/19 1524 9     Pain Loc --      Pain Edu? --      Excl. in [place]? --      Physical Exam Vitals signs and nursing note reviewed.  Constitutional:      General: She is not in acute distress.    Appearance: She is well-developed.  HENT:     Head: Normocephalic and atraumatic.  Eyes:     Conjunctiva/sclera: Conjunctivae normal.  Neck:     Musculoskeletal: Neck supple.  Cardiovascular:     Rate and Rhythm: Normal rate and regular rhythm.     Heart sounds: Normal heart sounds. No murmur. No friction rub.  Pulmonary:     Effort: Pulmonary effort is normal. No respiratory distress.     Breath sounds: Normal breath sounds. No wheezing or rales.  Abdominal:  General: There is no distension.     Palpations: Abdomen is soft.     Tenderness: There is generalized abdominal tenderness and tenderness in the epigastric area and periumbilical area. There is no guarding or rebound.  Skin:    General: Skin is warm.     Capillary Refill: Capillary refill takes less than 2 seconds.  Neurological:     Mental Status: She is alert and oriented to person, place, and time.     Motor: No abnormal muscle tone.       ____________________________________________   LABS (all labs ordered are listed, but only abnormal results are displayed)  Labs Reviewed  COMPREHENSIVE METABOLIC PANEL - Abnormal; Notable for the following components:      Result Value   Glucose, Bld 179 (*)    Creatinine, Ser 1.11 (*)    GFR calc non Af Amer 54 (*)    All other components within normal limits  URINALYSIS, COMPLETE (UACMP) WITH MICROSCOPIC - Abnormal; Notable for the following components:   Color, Urine AMBER (*)    APPearance HAZY (*)    Bilirubin Urine SMALL (*)    Ketones, ur 80 (*)    Protein, ur 100 (*)    Bacteria, UA FEW (*)    All other components within normal limits  SARS CORONAVIRUS 2 (HOSPITAL ORDER, [facility])    LIPASE, BLOOD  CBC    ____________________________________________  EKG: None ________________________________________  RADIOLOGY All imaging, including plain films, CT scans, and ultrasounds, independently reviewed by me, and interpretations confirmed via formal radiology reads.  ED MD interpretation:   CT scan reviewed by myself, concern for possible partial small bowel obstruction with blood gas at her surgical site in the right lower quadrant.  Official radiology report(s): Ct Abdomen Pelvis W Contrast  Result Date: 01/29/2019 CLINICAL DATA:  Lower abdominal and epigastric pain EXAM: CT ABDOMEN AND PELVIS WITH CONTRAST TECHNIQUE: Multidetector CT imaging of the abdomen and pelvis was performed using the standard protocol following bolus administration of intravenous contrast. CONTRAST:  158mL OMNIPAQUE IOHEXOL 300 MG/ML  SOLN COMPARISON:  12/03/2017 FINDINGS: Lower chest: Lung bases are clear. No effusions. Heart is normal size. Hepatobiliary: No focal hepatic abnormality. Gallbladder unremarkable. Pancreas: No focal abnormality or ductal dilatation. Spleen: No focal abnormality.  Normal size. Adrenals/Urinary Tract: No adrenal abnormality. No focal renal abnormality. No stones or hydronephrosis. Urinary bladder is unremarkable. Stomach/Bowel: There are dilated small bowel loops with air-fluid levels. Distal small bowel decompressed. Findings compatible with distal small bowel obstruction. Stomach and large bowel decompressed, grossly unremarkable. Vascular/Lymphatic: No evidence of aneurysm or adenopathy. Reproductive: Prior hysterectomy.  No adnexal masses. Other: Trace free fluid in the pelvis.  No free air. Musculoskeletal: No acute bony abnormality. IMPRESSION: Dilated small bowel loops into the right lower quadrant. Distal small bowel is decompressed. Findings compatible with partial small bowel obstruction. Transition point appears to be in the right lower quadrant near a suture line.  Electronically Signed   By: Rolm Baptise M.D.   On: 01/29/2019 20:16    ____________________________________________  PROCEDURES   Procedure(s) performed (including Critical Care):  Procedures  ____________________________________________  INITIAL IMPRESSION / MDM / [place] / ED COURSE  As part of my medical decision making, I reviewed the following data within the electronic MEDICAL RECORD NUMBER Notes from prior ED visits and [place] Controlled Substance Database      *Taler Kushner was evaluated in Emergency Department on 01/29/2019 for the symptoms described  in the history of present illness. She was evaluated in the context of the global COVID-19 pandemic, which necessitated consideration that the patient might be at risk for infection with the SARS-CoV-2 virus that causes COVID-19. Institutional protocols and algorithms that pertain to the evaluation of patients at risk for COVID-19 are in a state of rapid change based on information released by regulatory bodies including the CDC and federal and state organizations. These policies and algorithms were followed during the patient's care in the ED.  Some ED evaluations and interventions may be delayed as a result of limited staffing during the pandemic.*   Clinical Course as of Jan 29 2031  Wed Jan 29, 2019  2014 Consistent with dehydration and poor p.o. intake.  IV fluids given.  Urinalysis, Complete w Microscopic(!) [CI]  2015 Reassuring, normal LFTs.  Renal function at baseline.  Comprehensive metabolic panel(!) [CI]  6578 Doubt pancreatitis  Lipase, blood [CI]    Clinical Course User Index [CI] Duffy Bruce, MD    Medical Decision Making: 63 year old female here with partial small bowel obstruction secondary to adhesions related to prior surgery in the right lower quadrant.  Patient is not actively vomiting.  She appears well.  No evidence of perforation or high-grade obstruction.  IV fluids,  admit.  ____________________________________________  FINAL CLINICAL IMPRESSION(S) / ED DIAGNOSES  Final diagnoses:  Partial small bowel obstruction (HCC)  Nausea     MEDICATIONS GIVEN DURING THIS VISIT:  Medications  sodium chloride flush (NS) 0.9 % injection 3 mL (3 mLs Intravenous Given 01/29/19 1939)  sodium chloride 0.9 % bolus 1,000 mL (1,000 mLs Intravenous New Bag/Given 01/29/19 1938)  alum & mag hydroxide-simeth (MAALOX/MYLANTA) 200-200-20 MG/5ML suspension 30 mL (30 mLs Oral Given 01/29/19 1939)    And  lidocaine (XYLOCAINE) 2 % viscous mouth solution 15 mL (15 mLs Oral Given 01/29/19 1939)  famotidine (PEPCID) IVPB 20 mg premix (0 mg Intravenous Stopped 01/29/19 2022)  iohexol (OMNIPAQUE) 300 MG/ML solution 100 mL (100 mLs Intravenous Contrast Given 01/29/19 1959)     ED Discharge Orders    None       Note:  This document was prepared using Dragon voice recognition software and may include unintentional dictation errors.   Duffy Bruce, MD 01/29/19 2032

## 2019-01-29 NOTE — ED Notes (Signed)
ED TO INPATIENT HANDOFF REPORT  ED Nurse Name and Phone #: Dorrance Sellick [phone]  S Name/Age/Gender Haley Jones 63 y.o. female Room/Bed: ED02A/ED02A  Code Status   Code Status: Full Code  Home/SNF/Other Home Patient oriented to: self, place, time and situation Is this baseline? Yes   Triage Complete: Triage complete  Chief Complaint Abd pain  Triage Note Pt here with c/o lower abd pain that began yesterday, denies diarrhea or vomiting, has been nauseated. Pt states chills and fever yesterday, appears in NAD.    Allergies Allergies  Allergen Reactions  . Etodolac Other (See Comments)    Made her feel "loopy"  . Hydrocodone-Acetaminophen Nausea And Vomiting and Nausea Only  . Orphenadrine Citrate Other (See Comments)    Made her feel "loopy". (Norflex) muscle relaxant  . Penicillins Rash    Has patient had a PCN reaction causing immediate rash, facial/tongue/throat swelling, SOB or lightheadedness with hypotension: Unknown Has patient had a PCN reaction causing severe rash involving mucus membranes or skin necrosis: Unknown Has patient had a PCN reaction that required hospitalization: No Has patient had a PCN reaction occurring within the last 10 years: No Childhood reaction If all of the above answers are "NO", then may proceed with Cephalosporin use.   . Tramadol Other (See Comments)    Makes her feel very tired and loopy    Level of Care/Admitting Diagnosis ED Disposition    ED Disposition Condition [place]: [facility] [[id number]]  Level of Care: Med-Surg [16]  Covid Evaluation: N/A  Diagnosis: SBO (small bowel obstruction) [facility]) [[id number]]  Admitting Physician: Olean Ree [[id number]]  Attending Physician: Olean Ree [[id number]]  Estimated length of stay: 3 - 4 days  Certification:: I certify this patient will need inpatient services for at least 2 midnights  PT Class (Do Not Modify): Inpatient [101]  PT Acc Code (Do  Not Modify): Private [1]       B Medical/Surgery History Past Medical History:  Diagnosis Date  . Adnexal mass   . Anxiety   . Chronic back pain   . Depression   . Diabetes mellitus without complication ([place])   . Endometriosis 03/12/2018  . GERD (gastroesophageal reflux disease)   . Heart murmur 02/2018   undetected until she was an adult. no treatment  . Hoarse 12/06/2017  . Hypertension   . Right lower quadrant abdominal mass 12/06/2017  . Small bowel mass 02/20/2018  . Weight loss 12/06/2017   Past Surgical History:  Procedure Laterality Date  . ABDOMINAL HYSTERECTOMY  2008   ovaries also  . CESAREAN SECTION  1994  . COLONOSCOPY    . COLONOSCOPY WITH PROPOFOL N/A 02/12/2018   Procedure: COLONOSCOPY WITH PROPOFOL;  Surgeon: Toledo, Benay Pike, MD;  Location: [facility] ENDOSCOPY;  Service: Gastroenterology;  Laterality: N/A;  . ESOPHAGOGASTRODUODENOSCOPY (EGD) WITH PROPOFOL N/A 02/12/2018   Procedure: ESOPHAGOGASTRODUODENOSCOPY (EGD) WITH PROPOFOL;  Surgeon: Toledo, Benay Pike, MD;  Location: [facility] ENDOSCOPY;  Service: Gastroenterology;  Laterality: N/A;  . 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 LAPAROSCOPY N/A 02/20/2018   Procedure: LAPAROSCOPY DIAGNOSTIC, ( RESECTION OF RIGHT LOWER QUADRANT ABDOMINAL MASS);  Surgeon: Vickie Epley, MD;  Location: [facility] ORS;  Service: General;  Laterality: N/A;  . ROTATOR CUFF REPAIR Left 2011     A IV Location/Drains/Wounds Patient Lines/Drains/Airways Status   Active Line/Drains/Airways    Name:   Placement date:   Placement time:   Site:  Days:   Peripheral IV 01/29/19 Right Antecubital   01/29/19    1936    Antecubital   less than 1   NG/OG Tube Nasogastric 16 Fr. Right nare Aucultation Measured external length of tube 52 cm   01/29/19    2127    Right nare   less than 1          Intake/Output Last 24 hours No intake or output data in the 24 hours ending 01/29/19 2130  Labs/Imaging Results for orders  placed or performed during the hospital encounter of 01/29/19 (from the past 48 hour(s))  Urinalysis, Complete w Microscopic     Status: Abnormal   Collection Time: 01/29/19  3:26 PM  Result Value Ref Range   Color, Urine AMBER (A) YELLOW    Comment: BIOCHEMICALS MAY BE AFFECTED BY COLOR   APPearance HAZY (A) CLEAR   Specific Gravity, Urine 1.028 1.005 - 1.030   pH 5.0 5.0 - 8.0   Glucose, UA NEGATIVE NEGATIVE mg/dL   Hgb urine dipstick NEGATIVE NEGATIVE   Bilirubin Urine SMALL (A) NEGATIVE   Ketones, ur 80 (A) NEGATIVE mg/dL   Protein, ur 100 (A) NEGATIVE mg/dL   Nitrite NEGATIVE NEGATIVE   Leukocytes,Ua NEGATIVE NEGATIVE   RBC / HPF 0-5 0 - 5 RBC/hpf   WBC, UA 0-5 0 - 5 WBC/hpf   Bacteria, UA FEW (A) NONE SEEN   Squamous Epithelial / LPF 0-5 0 - 5   Mucus PRESENT    Hyaline Casts, UA PRESENT     Comment: Performed at [facility], [address] [place], [place] [address]  Lipase, blood     Status: None   Collection Time: 01/29/19  3:30 PM  Result Value Ref Range   Lipase 23 11 - 51 U/L    Comment: Performed at [facility], [address] [place], [place] [address]  Comprehensive metabolic panel     Status: Abnormal   Collection Time: 01/29/19  3:30 PM  Result Value Ref Range   Sodium 137 135 - 145 mmol/L   Potassium 4.4 3.5 - 5.1 mmol/L   Chloride 100 98 - 111 mmol/L   CO2 24 22 - 32 mmol/L   Glucose, Bld 179 (H) 70 - 99 mg/dL   BUN 15 8 - 23 mg/dL   Creatinine, Ser 1.11 (H) 0.44 - 1.00 mg/dL   Calcium 9.2 8.9 - 10.3 mg/dL   Total Protein 7.2 6.5 - 8.1 g/dL   Albumin 4.0 3.5 - 5.0 g/dL   AST 15 15 - 41 U/L   ALT 17 0 - 44 U/L   Alkaline Phosphatase 56 38 - 126 U/L   Total Bilirubin 0.7 0.3 - 1.2 mg/dL   GFR calc non Af Amer 54 (L) >60 mL/min   GFR calc Af Amer >60 >60 mL/min   Anion gap 13 5 - 15    Comment: Performed at [facility], [address] [place], [place] [address]  CBC     Status: None   Collection Time: 01/29/19  3:30  PM  Result Value Ref Range   WBC 7.4 4.0 - 10.5 K/uL   RBC 3.90 3.87 - 5.11 MIL/uL   Hemoglobin 12.5 12.0 - 15.0 g/dL   HCT 36.6 36.0 - 46.0 %   MCV 93.8 80.0 - 100.0 fL   MCH 32.1 26.0 - 34.0 pg   MCHC 34.2 30.0 - 36.0 g/dL   RDW 11.9 11.5 - 15.5 %   Platelets 217  150 - 400 K/uL   nRBC 0.0 0.0 - 0.2 %    Comment: Performed at [facility], [place],  [address]   Ct Abdomen Pelvis W Contrast  Result Date: 01/29/2019 CLINICAL DATA:  Lower abdominal and epigastric pain EXAM: CT ABDOMEN AND PELVIS WITH CONTRAST TECHNIQUE: Multidetector CT imaging of the abdomen and pelvis was performed using the standard protocol following bolus administration of intravenous contrast. CONTRAST:  178mL OMNIPAQUE IOHEXOL 300 MG/ML  SOLN COMPARISON:  12/03/2017 FINDINGS: Lower chest: Lung bases are clear. No effusions. Heart is normal size. Hepatobiliary: No focal hepatic abnormality. Gallbladder unremarkable. Pancreas: No focal abnormality or ductal dilatation. Spleen: No focal abnormality.  Normal size. Adrenals/Urinary Tract: No adrenal abnormality. No focal renal abnormality. No stones or hydronephrosis. Urinary bladder is unremarkable. Stomach/Bowel: There are dilated small bowel loops with air-fluid levels. Distal small bowel decompressed. Findings compatible with distal small bowel obstruction. Stomach and large bowel decompressed, grossly unremarkable. Vascular/Lymphatic: No evidence of aneurysm or adenopathy. Reproductive: Prior hysterectomy.  No adnexal masses. Other: Trace free fluid in the pelvis.  No free air. Musculoskeletal: No acute bony abnormality. IMPRESSION: Dilated small bowel loops into the right lower quadrant. Distal small bowel is decompressed. Findings compatible with partial small bowel obstruction. Transition point appears to be in the right lower quadrant near a suture line. Electronically Signed   By: Rolm Baptise M.D.   On: 01/29/2019 20:16    Pending  Labs Unresulted Labs (From admission, onward)    Start     Ordered   01/30/19 9937  Basic metabolic panel  Tomorrow morning,   STAT     01/29/19 2046   01/30/19 0500  Magnesium  Tomorrow morning,   STAT     01/29/19 2046   01/30/19 0500  CBC WITH DIFFERENTIAL  Tomorrow morning,   STAT     01/29/19 2046   01/29/19 2116  Hemoglobin A1c  Once,   STAT    Comments: To assess prior glycemic control    01/29/19 2115   01/29/19 2032  SARS Coronavirus 2 (CEPHEID - Performed in [place] hospital lab), Hosp Order  (Asymptomatic Patients Labs)  Once,   STAT    Question:  Rule Out  Answer:  Yes   01/29/19 2031          Vitals/Pain Today's Vitals   01/29/19 1523 01/29/19 1524 01/29/19 1944 01/29/19 2023  BP: 129/78  (!) 113/44 (!) 157/81  Pulse: 94  82 93  Resp: 16  19 19   Temp: 98.6 F (37 C)     TempSrc: Oral     SpO2: 97%  97% 100%  Weight:  83 kg    Height:  4\' 9"  (1.448 m)    PainSc:  9       Isolation Precautions No active isolations  Medications Medications  lactated ringers infusion (has no administration in time range)  ketorolac (TORADOL) 30 MG/ML injection 30 mg (has no administration in time range)  HYDROmorphone (DILAUDID) injection 0.5 mg (has no administration in time range)  ondansetron (ZOFRAN-ODT) disintegrating tablet 4 mg (has no administration in time range)    Or  ondansetron (ZOFRAN) injection 4 mg (has no administration in time range)  pantoprazole (PROTONIX) injection 40 mg (has no administration in time range)  enoxaparin (LOVENOX) injection 40 mg (has no administration in time range)  insulin aspart (novoLOG) injection 0-20 Units (has no administration in time range)  hydrALAZINE (APRESOLINE) injection 10 mg (has no administration  in time range)  sodium chloride flush (NS) 0.9 % injection 3 mL (3 mLs Intravenous Given 01/29/19 1939)  sodium chloride 0.9 % bolus 1,000 mL (1,000 mLs Intravenous New Bag/Given 01/29/19 1938)  alum & mag hydroxide-simeth  (MAALOX/MYLANTA) 200-200-20 MG/5ML suspension 30 mL (30 mLs Oral Given 01/29/19 1939)    And  lidocaine (XYLOCAINE) 2 % viscous mouth solution 15 mL (15 mLs Oral Given 01/29/19 1939)  famotidine (PEPCID) IVPB 20 mg premix (0 mg Intravenous Stopped 01/29/19 2022)  iohexol (OMNIPAQUE) 300 MG/ML solution 100 mL (100 mLs Intravenous Contrast Given 01/29/19 1959)    Mobility walks Low fall risk   Focused Assessments gi: bowel obstruction   R Recommendations: See Admitting Provider Note  Report given to:   Additional Notes:

## 2019-01-29 NOTE — ED Triage Notes (Signed)
Pt here with c/o lower abd pain that began yesterday, denies diarrhea or vomiting, has been nauseated. Pt states chills and fever yesterday, appears in NAD.

## 2019-01-29 NOTE — Progress Notes (Signed)
01/29/19  Called by Dr. Ellender Hose regarding patient presenting to the ED with few day history of progressing abdominal pain, associated with nausea but no emesis.  She had flatus and bowel movement yesterday, though smaller amount.  Decreased appetite as well and has been only taking liquids for the last two days.  History of lap converted to open small bowel resection for small bowel mass with Dr. Rosana Hoes on 02/20/18.  CT scan viewed independently by me showing small bowel obstruction with transition point in right lower quadrant.  Vital signs normal, normal WBC and electolytes.  Discussed with Dr. Ellender Hose.  Patient will be admitted to surgery team.  NG tube will be placed.  NPO, IV fluids, pain control.  Full H&P to follow in AM.  Olean Ree, MD

## 2019-01-30 ENCOUNTER — Inpatient Hospital Stay: Payer: BC Managed Care – PPO

## 2019-01-30 DIAGNOSIS — K56600 Partial intestinal obstruction, unspecified as to cause: Secondary | ICD-10-CM

## 2019-01-30 LAB — CBC WITH DIFFERENTIAL/PLATELET
Abs Immature Granulocytes: 0.02 10*3/uL (ref 0.00–0.07)
Basophils Absolute: 0 10*3/uL (ref 0.0–0.1)
Basophils Relative: 0 %
Eosinophils Absolute: 0.2 10*3/uL (ref 0.0–0.5)
Eosinophils Relative: 3 %
HCT: 34.2 % — ABNORMAL LOW (ref 36.0–46.0)
Hemoglobin: 11.4 g/dL — ABNORMAL LOW (ref 12.0–15.0)
Immature Granulocytes: 0 %
Lymphocytes Relative: 18 %
Lymphs Abs: 1.3 10*3/uL (ref 0.7–4.0)
MCH: 31.5 pg (ref 26.0–34.0)
MCHC: 33.3 g/dL (ref 30.0–36.0)
MCV: 94.5 fL (ref 80.0–100.0)
Monocytes Absolute: 0.4 10*3/uL (ref 0.1–1.0)
Monocytes Relative: 6 %
Neutro Abs: 5.2 10*3/uL (ref 1.7–7.7)
Neutrophils Relative %: 73 %
Platelets: 223 10*3/uL (ref 150–400)
RBC: 3.62 MIL/uL — ABNORMAL LOW (ref 3.87–5.11)
RDW: 11.9 % (ref 11.5–15.5)
WBC: 7.1 10*3/uL (ref 4.0–10.5)
nRBC: 0 % (ref 0.0–0.2)

## 2019-01-30 LAB — GLUCOSE, CAPILLARY
Glucose-Capillary: 104 mg/dL — ABNORMAL HIGH (ref 70–99)
Glucose-Capillary: 105 mg/dL — ABNORMAL HIGH (ref 70–99)
Glucose-Capillary: 149 mg/dL — ABNORMAL HIGH (ref 70–99)
Glucose-Capillary: 87 mg/dL (ref 70–99)
Glucose-Capillary: 88 mg/dL (ref 70–99)
Glucose-Capillary: 98 mg/dL (ref 70–99)

## 2019-01-30 LAB — BASIC METABOLIC PANEL
Anion gap: 8 (ref 5–15)
BUN: 14 mg/dL (ref 8–23)
CO2: 26 mmol/L (ref 22–32)
Calcium: 8.5 mg/dL — ABNORMAL LOW (ref 8.9–10.3)
Chloride: 104 mmol/L (ref 98–111)
Creatinine, Ser: 0.94 mg/dL (ref 0.44–1.00)
GFR calc Af Amer: >60 mL/min (ref >60)
GFR calc non Af Amer: >60 mL/min (ref >60)
Glucose, Bld: 121 mg/dL — ABNORMAL HIGH (ref 70–99)
Potassium: 4 mmol/L (ref 3.5–5.1)
Sodium: 138 mmol/L (ref 135–145)

## 2019-01-30 LAB — MAGNESIUM: Magnesium: 1.8 mg/dL (ref 1.7–2.4)

## 2019-01-30 LAB — HEMOGLOBIN A1C
Hgb A1c MFr Bld: 6.4 % — ABNORMAL HIGH (ref 4.8–5.6)
Mean Plasma Glucose: 136.98 mg/dL

## 2019-01-30 MED ORDER — DIATRIZOATE MEGLUMINE & SODIUM 66-10 % PO SOLN
90.0000 mL | Freq: Once | ORAL | Status: AC
  Administered 2019-01-30: 11:00:00 90 mL via NASOGASTRIC

## 2019-01-30 NOTE — Progress Notes (Signed)
To Xray via bed with Tama Gander. NGT clamped at this time; no acute distress noted. Barbaraann Faster, RN 7:01 AM; 01/30/2019

## 2019-01-30 NOTE — H&P (Signed)
[facility] SURGICAL HISTORY & PHYSICAL (cpt [phone])  HISTORY OF PRESENT ILLNESS (HPI):  63 y.o. female presented to [facility] ED yesterday for abdominal pain. Patient reports a 1 week history of progressively worsening abdominal pain which she described as a crampy aching pain. The pain at onset had been peri-umbilical but is now epigastric. She endorsed associated nausea and decreased appetite. No reports of fever, chills, cough, congestion, CP, SOB, emesis, or bladder changes. Her last BM was yesterday. Over the last few days prior to presentation she had only been tolerating liquids. No flatus in last 24 hours at presentation. Previous abdominal surgeries include abdominal hysterectomy (2008) and laparoscopy converted to open small bowel resection (Dr Rosana Hoes, 02/20/2018). Work up in the ED was concerning for small bowel obstruction with transition point in RLQ. NGT was placed in the ED and she was admitted to general surgery service.    General surgery was consulted by emergency medicine physician Dr Duffy Bruce, MD for evaluation and management of small bowel obstruction.   This morning, she reports that she is feeling much better than on presentation. She reported resolution in her abdominal pain and no nausea or emesis. Additionally, she endorses multiple BMs and is passing gas "normally" this morning. I/O for NGT not recorded.    PAST MEDICAL HISTORY (PMH):  Past Medical History:  Diagnosis Date  . Adnexal mass   . Anxiety   . Chronic back pain   . Depression   . Diabetes mellitus without complication ([place])   . Endometriosis 03/12/2018  . GERD (gastroesophageal reflux disease)   . Heart murmur 02/2018   undetected until she was an adult. no treatment  . Hoarse 12/06/2017  . Hypertension   . Right lower quadrant abdominal mass 12/06/2017  . Small bowel mass 02/20/2018  . Weight loss 12/06/2017    Reviewed. Otherwise negative.   PAST SURGICAL HISTORY ([place]):  Past  Surgical History:  Procedure Laterality Date  . ABDOMINAL HYSTERECTOMY  2008   ovaries also  . CESAREAN SECTION  1994  . COLONOSCOPY    . COLONOSCOPY WITH PROPOFOL N/A 02/12/2018   Procedure: COLONOSCOPY WITH PROPOFOL;  Surgeon: Toledo, Benay Pike, MD;  Location: [facility] ENDOSCOPY;  Service: Gastroenterology;  Laterality: N/A;  . ESOPHAGOGASTRODUODENOSCOPY (EGD) WITH PROPOFOL N/A 02/12/2018   Procedure: ESOPHAGOGASTRODUODENOSCOPY (EGD) WITH PROPOFOL;  Surgeon: Toledo, Benay Pike, MD;  Location: [facility] ENDOSCOPY;  Service: Gastroenterology;  Laterality: N/A;  . 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 LAPAROSCOPY N/A 02/20/2018   Procedure: LAPAROSCOPY DIAGNOSTIC, ( RESECTION OF RIGHT LOWER QUADRANT ABDOMINAL MASS);  Surgeon: Vickie Epley, MD;  Location: [facility] ORS;  Service: General;  Laterality: N/A;  . ROTATOR CUFF REPAIR Left 2011    Reviewed. Otherwise negative.   MEDICATIONS:  Prior to Admission medications   Medication Sig Start Date End Date Taking? Authorizing Provider  Calcium Carbonate-Vitamin D (CALCIUM 600+D) 600-400 MG-UNIT tablet Take 2 tablets by mouth daily.   Yes [provider]  clonazePAM (KLONOPIN) 1 MG tablet Take 1 mg by mouth at bedtime. 07/17/17  Yes [provider]  diphenhydrAMINE-APAP, sleep, (TYLENOL PM EXTRA STRENGTH PO) Take 2 tablets by mouth at bedtime.    Yes [provider]  estradiol (ESTRACE) 2 MG tablet Take 2 mg by mouth daily. 11/04/17  Yes [provider]  glimepiride (AMARYL) 1 MG tablet Take 1 mg by mouth daily. 08/08/17  Yes [provider]  lisinopril (PRINIVIL,ZESTRIL) 10 MG tablet Take  10 mg by mouth daily. 02/13/17  Yes [provider]  metFORMIN (GLUCOPHAGE) 500 MG tablet Take 500-1,000 mg by mouth See admin instructions. Take 1 tablet (500 mg) in the morning & take 2 tablets (1000 mg) by mouth in the evening. 07/11/17  Yes [provider]  Multiple Vitamin  (MULTIVITAMIN WITH MINERALS) TABS tablet Take 1 tablet by mouth daily.   Yes [provider]  pantoprazole (PROTONIX) 40 MG tablet Take 40 mg by mouth daily before breakfast. 01/28/18  Yes [provider]  sertraline (ZOLOFT) 50 MG tablet Take 50 mg by mouth daily. 07/08/17  Yes [provider]  simvastatin (ZOCOR) 20 MG tablet Take 20 mg by mouth at bedtime.  03/14/18 03/14/19 Yes [provider]     ALLERGIES:  Allergies  Allergen Reactions  . Etodolac Other (See Comments)    Made her feel "loopy"  . Hydrocodone-Acetaminophen Nausea And Vomiting and Nausea Only  . Orphenadrine Citrate Other (See Comments)    Made her feel "loopy". (Norflex) muscle relaxant  . Penicillins Rash    Has patient had a PCN reaction causing immediate rash, facial/tongue/throat swelling, SOB or lightheadedness with hypotension: Unknown Has patient had a PCN reaction causing severe rash involving mucus membranes or skin necrosis: Unknown Has patient had a PCN reaction that required hospitalization: No Has patient had a PCN reaction occurring within the last 10 years: No Childhood reaction If all of the above answers are "NO", then may proceed with Cephalosporin use.   . Tramadol Other (See Comments)    Makes her feel very tired and loopy     SOCIAL HISTORY:  Social History   Socioeconomic History  . Marital status: Single    Spouse name: Not on file  . Number of children: Not on file  . Years of education: Not on file  . Highest education level: Not on file  Occupational History  . Not on file  Social Needs  . Financial resource strain: Not on file  . Food insecurity    Worry: Not on file    Inability: Not on file  . Transportation needs    Medical: Not on file    Non-medical: Not on file  Tobacco Use  . Smoking status: Never Smoker  . Smokeless tobacco: Never Used  Substance and Sexual Activity  . Alcohol use: No    Comment: rare  . Drug use: No  . Sexual  activity: Yes  Lifestyle  . Physical activity    Days per week: Not on file    Minutes per session: Not on file  . Stress: Not on file  Relationships  . Social Herbalist on phone: Not on file    Gets together: Not on file    Attends religious service: Not on file    Active member of club or organization: Not on file    Attends meetings of clubs or organizations: Not on file    Relationship status: Not on file  . Intimate partner violence    Fear of current or ex partner: Not on file    Emotionally abused: Not on file    Physically abused: Not on file    Forced sexual activity: Not on file  Other Topics Concern  . Not on file  Social History Narrative  . Not on file     FAMILY HISTORY:  Family History  Problem Relation Age of Onset  . Melanoma Mother   . Lung cancer Father   .  Ovarian cancer Maternal Grandmother   . Breast cancer Neg Hx     Otherwise negative.   REVIEW OF SYSTEMS:  Review of Systems  Constitutional: Negative for chills and fever.  HENT: Negative for congestion and sore throat.   Respiratory: Negative for cough and shortness of breath.   Cardiovascular: Negative for chest pain and palpitations.  Gastrointestinal: Positive for abdominal pain and nausea. Negative for constipation, diarrhea and vomiting.  Genitourinary: Negative for dysuria and urgency.  Neurological: Negative for dizziness and headaches.  All other systems reviewed and are negative.   VITAL SIGNS:  Temp:  [98.6 F (37 C)-98.7 F (37.1 C)] 98.7 F (37.1 C) (06/18 0425) Pulse Rate:  [82-97] 90 (06/18 0619) Resp:  [16-19] 16 (06/18 0425) BP: (113-188)/(44-94) 152/91 (06/18 0619) SpO2:  [97 %-100 %] 100 % (06/18 0425) Weight:  [81.7 kg-83 kg] 81.7 kg (06/17 2229)     Height: 4\' 9"  (144.8 cm) Weight: 81.7 kg BMI (Calculated): 38.97   PHYSICAL EXAM:  Physical Exam Vitals signs and nursing note reviewed. Exam conducted with a chaperone present.  Constitutional:       General: She is not in acute distress.    Appearance: She is well-developed. She is obese. She is not ill-appearing.  HENT:     Head:     Comments: NGT in place Eyes:     General: No scleral icterus.    Extraocular Movements: Extraocular movements intact.  Cardiovascular:     Rate and Rhythm: Normal rate and regular rhythm.     Heart sounds: Normal heart sounds.  Pulmonary:     Effort: Pulmonary effort is normal. No respiratory distress.     Breath sounds: Normal breath sounds. No wheezing.  Abdominal:     General: Abdomen is protuberant. A surgical scar is present. There is no distension.     Palpations: Abdomen is soft.     Tenderness: There is no abdominal tenderness. There is no guarding or rebound.  Genitourinary:    Comments: Deferred Skin:    General: Skin is warm and dry.     Coloration: Skin is not jaundiced.  Neurological:     General: No focal deficit present.     Mental Status: She is alert and oriented to person, place, and time.  Psychiatric:        Mood and Affect: Mood normal.        Behavior: Behavior normal.     INTAKE/OUTPUT:  This shift: No intake/output data recorded.  Last 2 shifts: @IOLAST2SHIFTS @  Labs:  CBC Latest Ref Rng & Units 01/30/2019 01/29/2019 02/22/2018  WBC 4.0 - 10.5 K/uL 7.1 7.4 5.3  Hemoglobin 12.0 - 15.0 g/dL 11.4(L) 12.5 9.8(L)  Hematocrit 36.0 - 46.0 % 34.2(L) 36.6 28.9(L)  Platelets 150 - 400 K/uL 223 217 169   CMP Latest Ref Rng & Units 01/30/2019 01/29/2019 02/22/2018  Glucose 70 - 99 mg/dL 121(H) 179(H) 169(H)  BUN 8 - 23 mg/dL 14 15 8   Creatinine 0.44 - 1.00 mg/dL 0.94 1.11(H) 0.63  Sodium 135 - 145 mmol/L 138 137 140  Potassium 3.5 - 5.1 mmol/L 4.0 4.4 4.1  Chloride 98 - 111 mmol/L 104 100 110  CO2 22 - 32 mmol/L 26 24 25   Calcium 8.9 - 10.3 mg/dL 8.5(L) 9.2 8.1(L)  Total Protein 6.5 - 8.1 g/dL - 7.2 -  Total Bilirubin 0.3 - 1.2 mg/dL - 0.7 -  Alkaline Phos 38 - 126 U/L - 56 -  AST 15 - 41 U/L -  15 -  ALT 0 - 44 U/L - 17  -     Imaging studies:   KUB (01/30/2019) personally reviewed and radiologist report reviewed:  IMPRESSION: No marked change in findings compatible with small bowel obstruction.  CT Abdomen/Pelvis (01/29/2019) personally reviewed and radiologist report reviewed:  IMPRESSION: Dilated small bowel loops into the right lower quadrant. Distal small bowel is decompressed. Findings compatible with partial small bowel obstruction. Transition point appears to be in the right lower quadrant near a suture line.    Assessment/Plan: (ICD-10's: K72.51) 63 y.o. female with a somewhat clinically improving small bowel obstruction most likely attributable to post surgical adhesions following abdominal hysterectomy (2008) and open small bowel resection (2019) although her KUB this morning is less reassuring, complicated by pertinent comorbidities including anxiety, DM, GERD, HTN, and obesity.    - Continue NPO + IVF  - Continue NGT for decompression; monitor output  - Will obtain small bowel follow through today with gastrografin to reassess SBO given clinical improvement despite lack of KUB improvement.   - Pain control prn (minimize narcotics); antiemetics prn  - Monitor abdominal examination; on-going bowel function   - No indication for surgical intervention at this time. She understands that if she fails conservative management she may require surgical intervention this admission   - Medical management of comorbidities  - mobilization encouraged   - DVT prophylaxis  All of the above findings and recommendations were discussed with the patient and her family (husband via telephone), and all of her and her family's questions were answered to their expressed satisfaction.  -- Edison Simon, PA-C [facility] 01/30/2019, 7:25 AM [phone] M-F: 7am - 4pm

## 2019-01-31 DIAGNOSIS — K5651 Intestinal adhesions [bands], with partial obstruction: Principal | ICD-10-CM

## 2019-01-31 LAB — CBC
HCT: 31.5 % — ABNORMAL LOW (ref 36.0–46.0)
Hemoglobin: 10.6 g/dL — ABNORMAL LOW (ref 12.0–15.0)
MCH: 31.8 pg (ref 26.0–34.0)
MCHC: 33.7 g/dL (ref 30.0–36.0)
MCV: 94.6 fL (ref 80.0–100.0)
Platelets: 161 10*3/uL (ref 150–400)
RBC: 3.33 MIL/uL — ABNORMAL LOW (ref 3.87–5.11)
RDW: 11.8 % (ref 11.5–15.5)
WBC: 6.1 10*3/uL (ref 4.0–10.5)
nRBC: 0 % (ref 0.0–0.2)

## 2019-01-31 LAB — GLUCOSE, CAPILLARY
Glucose-Capillary: 113 mg/dL — ABNORMAL HIGH (ref 70–99)
Glucose-Capillary: 136 mg/dL — ABNORMAL HIGH (ref 70–99)
Glucose-Capillary: 140 mg/dL — ABNORMAL HIGH (ref 70–99)
Glucose-Capillary: 88 mg/dL (ref 70–99)
Glucose-Capillary: 89 mg/dL (ref 70–99)
Glucose-Capillary: 99 mg/dL (ref 70–99)

## 2019-01-31 MED ORDER — INSULIN ASPART 100 UNIT/ML ~~LOC~~ SOLN
0.0000 [IU] | Freq: Three times a day (TID) | SUBCUTANEOUS | Status: DC
  Administered 2019-02-01: 09:00:00 3 [IU] via SUBCUTANEOUS
  Filled 2019-01-31: qty 1

## 2019-01-31 MED ORDER — INSULIN ASPART 100 UNIT/ML ~~LOC~~ SOLN: 0.0000 [IU] | Freq: Every day | SUBCUTANEOUS | Status: DC

## 2019-01-31 MED ORDER — LISINOPRIL 10 MG PO TABS
10.0000 mg | ORAL_TABLET | Freq: Every day | ORAL | Status: DC
  Administered 2019-01-31 – 2019-02-01 (×2): 10 mg via ORAL
  Filled 2019-01-31 (×2): qty 1

## 2019-01-31 NOTE — Plan of Care (Signed)
Patient doing well. NG tube was removed.  Started on clear liquid diet and tolerating it well.  Ambulated and has sat up in the chair most of the day.  No significant changes.  Patient stated I didn't need to call her family that she was updating them.

## 2019-01-31 NOTE — Progress Notes (Addendum)
[facility] SURGICAL PROGRESS NOTE (cpt [phone])  Hospital Day(s): 2.   Post op day(s):  Marland Kitchen   Interval History: Patient seen and examined, no acute events or new complaints overnight. Patient reports that she is "feeling wonderful" this morning. She denied any fever, chills, nausea, emesis, or abdominal pain. She endorses passing flatus and multiple BMs. NGT with only 25 cs out per chart review. Underwent gastrografin challenge yesterday with contrast throughout the colon. Passing gas, having bowel movements. No other complaints.   Review of Systems:  Constitutional: denies fever, chills  HEENT: denies cough or congestion  Respiratory: denies any shortness of breath  Cardiovascular: denies chest pain or palpitations  Gastrointestinal: denies abdominal pain, N/V, or diarrhea/and bowel function as per interval history Genitourinary: denies burning with urination or urinary frequency   Vital signs in last 24 hours: [min-max] current  Temp:  [98.5 F (36.9 C)-98.9 F (37.2 C)] 98.5 F (36.9 C) (06/19 0510) Pulse Rate:  [82-88] 88 (06/19 0510) Resp:  [16-18] 18 (06/19 0510) BP: (153-180)/(81-97) 177/88 (06/19 0510) SpO2:  [96 %-100 %] 98 % (06/19 0510)     Height: 4\' 9"  (144.8 cm) Weight: 81.7 kg BMI (Calculated): 38.97   Intake/Output last 2 shifts:  06/18 0701 - 06/19 0700 In: 1886.3 [I.V.:1886.3] Out: 525 [Urine:500; Emesis/NG output:25]   Physical Exam:  Constitutional: alert, cooperative and no distress  HENT: normocephalic without obvious abnormality, NGT in place (removed at bedside)  Eyes: PERRL, EOM's grossly intact and symmetric  Respiratory: breathing non-labored at rest  Gastrointestinal: soft, non-tender, and non-distended. No rebound, guarding   Labs:  CBC Latest Ref Rng & Units 01/31/2019 01/30/2019 01/29/2019  WBC 4.0 - 10.5 K/uL 6.1 7.1 7.4  Hemoglobin 12.0 - 15.0 g/dL 10.6(L) 11.4(L) 12.5  Hematocrit 36.0 - 46.0 % 31.5(L) 34.2(L) 36.6  Platelets  150 - 400 K/uL 161 223 217   CMP Latest Ref Rng & Units 01/30/2019 01/29/2019 02/22/2018  Glucose 70 - 99 mg/dL 121(H) 179(H) 169(H)  BUN 8 - 23 mg/dL 14 15 8   Creatinine 0.44 - 1.00 mg/dL 0.94 1.11(H) 0.63  Sodium 135 - 145 mmol/L 138 137 140  Potassium 3.5 - 5.1 mmol/L 4.0 4.4 4.1  Chloride 98 - 111 mmol/L 104 100 110  CO2 22 - 32 mmol/L 26 24 25   Calcium 8.9 - 10.3 mg/dL 8.5(L) 9.2 8.1(L)  Total Protein 6.5 - 8.1 g/dL - 7.2 -  Total Bilirubin 0.3 - 1.2 mg/dL - 0.7 -  Alkaline Phos 38 - 126 U/L - 56 -  AST 15 - 41 U/L - 15 -  ALT 0 - 44 U/L - 17 -     Imaging studies:   KUB - Gastrografin Challenge (01/30/2019) personally reviewed and radiologist report reviewed and agree with below:  IMPRESSION: 1. Persistent central small bowel dilatation. There is contrast material within the rectum and colon. 2. Esophageal tube tip and side-port project over the lower mediastinum. Recommend repositioning and repeat radiograph to document appropriate positioning below the diaphragm.   Assessment/Plan: (ICD-10's: K69.51) 63 y.o. female with clinically resolved partial small bowel obstruction most likely attributable to post surgical adhesions following abdominal hysterectomy (2008) and open small bowel resection (2019) although her KUB this morning is less reassuring, complicated by pertinent comorbidities including anxiety, DM, GERD, HTN, and obesity.    - Removed NGT this morning and initiate clear liquid diet; advance as tolerates   - Pain control prn (minimize narcotics); antiemetics prn             -  Monitor abdominal examination; on-going bowel function              - No indication for surgical intervention at this time.   - Medical management of comorbidities             - mobilization encouraged              - DVT prophylaxis    - Discharge planning: if toelrates advancement of diet; hopefully home in next 24-48 horus   All of the above findings and recommendations were discussed  with the patient, and the medical team, and all of patient's questions were answered to her expressed satisfaction.  -- Edison Simon, PA-C [facility] 01/31/2019, 8:13 AM [phone] M-F: 7am - 4pm  I saw and evaluated the patient.  I agree with the above documentation, exam, and plan, which I have edited where appropriate. Fredirick Maudlin  11:37 AM

## 2019-02-01 LAB — GLUCOSE, CAPILLARY: Glucose-Capillary: 144 mg/dL — ABNORMAL HIGH (ref 70–99)

## 2019-02-01 NOTE — Discharge Summary (Signed)
Patient ID: Haley Jones MRN: [id number] DOB/AGE: 1955-08-30 63 y.o.  Admit date: 01/29/2019 Discharge date: 02/01/2019   Discharge Diagnoses:  Active Problems:   SBO (small bowel obstruction) ([place])   Procedures: None  Hospital Course: Patient admitted with diagnosis of a small bowel obstruction.  She was treated with bowel rest, nasogastric tube, IV fluids and pain management.  She had Gastrografin challenge which shows contrast in the rectum.  NGT was removed and patient was started on diet and advanced up to soft diet and tolerated well.  Today patient had scrambled eggs with toast and tolerated without nausea and abdominal pain.  She reports this morning that she still passing gas.  She denies abdominal distention or abdominal pain.  She has adequate vital signs.  Physical Exam  Constitutional: She is oriented to person, place, and time and well-developed, well-nourished, and in no distress.  Cardiovascular: Normal rate and regular rhythm.  Pulmonary/Chest: Effort normal and breath sounds normal.  Abdominal: Soft. Bowel sounds are normal. She exhibits no distension. There is no abdominal tenderness. There is no rebound.  Neurological: She is alert and oriented to person, place, and time.  Skin: Skin is warm.   Consults: None  Discharge disposition: 01-Home or Self Care       Discharge Instructions    Diet - low sodium heart healthy   Complete by: As directed    Increase activity slowly   Complete by: As directed      Allergies as of 02/01/2019      Reactions   Etodolac Other (See Comments)   Made her feel "loopy"   Hydrocodone-acetaminophen Nausea And Vomiting, Nausea Only   Orphenadrine Citrate Other (See Comments)   Made her feel "loopy". (Norflex) muscle relaxant   Penicillins Rash   Has patient had a PCN reaction causing immediate rash, facial/tongue/throat swelling, SOB or lightheadedness with hypotension: Unknown Has patient had a PCN reaction  causing severe rash involving mucus membranes or skin necrosis: Unknown Has patient had a PCN reaction that required hospitalization: No Has patient had a PCN reaction occurring within the last 10 years: No Childhood reaction If all of the above answers are "NO", then may proceed with Cephalosporin use.   Tramadol Other (See Comments)   Makes her feel very tired and loopy      Medication List    TAKE these medications   Calcium 600+D 600-400 MG-UNIT tablet Generic drug: Calcium Carbonate-Vitamin D Take 2 tablets by mouth daily.   clonazePAM 1 MG tablet Commonly known as: KLONOPIN Take 1 mg by mouth at bedtime.   estradiol 2 MG tablet Commonly known as: ESTRACE Take 2 mg by mouth daily.   glimepiride 1 MG tablet Commonly known as: AMARYL Take 1 mg by mouth daily.   lisinopril 10 MG tablet Commonly known as: ZESTRIL Take 10 mg by mouth daily.   metFORMIN 500 MG tablet Commonly known as: GLUCOPHAGE Take 500-1,000 mg by mouth See admin instructions. Take 1 tablet (500 mg) in the morning & take 2 tablets (1000 mg) by mouth in the evening.   multivitamin with minerals Tabs tablet Take 1 tablet by mouth daily.   pantoprazole 40 MG tablet Commonly known as: PROTONIX Take 40 mg by mouth daily before breakfast.   sertraline 50 MG tablet Commonly known as: ZOLOFT Take 50 mg by mouth daily.   simvastatin 20 MG tablet Commonly known as: ZOCOR Take 20 mg by mouth at bedtime.   TYLENOL PM EXTRA STRENGTH PO Take 2  tablets by mouth at bedtime.      Follow-up Information    Piscoya, Jacqulyn Bath, MD Follow up in 2 week(s).   Specialty: General Surgery Contact information: [address] [place] [address] [phone]          This discharge encounter was more than 30-minute most of the time counseling the patient and coordinating plan of care.

## 2019-02-01 NOTE — Progress Notes (Signed)
MD ordered patient to be discharged home.  Discharge instructions were reviewed with the patient and she voiced understanding.  Patient instructed on making own follow-up appointment.  No new prescriptions.  IV was removed with catheter intact.  All patients questions were answered.  Patient left via wheelchair escorted by nursing.

## 2019-02-01 NOTE — Discharge Instructions (Signed)
°  Diet: Resume home heart healthy regular diet.   Activity: Increase activity as tolerated. Light activity and walking are encouraged. Do not drive or drink alcohol if taking narcotic pain medications.  Medications: Resume all home medications. For mild to moderate pain: acetaminophen (Tylenol) or ibuprofen (if no kidney disease).   Call office [phone] at any time if any questions, worsening pain, fevers/chills, bleeding, drainage from incision site, or other concerns.

## 2019-06-27 IMAGING — MR MR LUMBAR SPINE W/O CM
4 of 5 series · 13 of 48 positions shown · non-contrast
Comparison: CT Abdomen and Pelvis 02/14/2016. Lumbar MRI
08/04/2011.

CLINICAL DATA: 61-year-old female with bilateral sciatic pain and
lumbar back pain for several years. Left leg pain for 2 weeks with
no known injury.

EXAM:
MRI LUMBAR SPINE WITHOUT CONTRAST
TECHNIQUE: Multiplanar, multisequence MR imaging of the lumbar spine was
performed. No intravenous contrast was administered.

[Series 2: T2 · sagittal · 4.0mm · 0.44mm/px · 4 of 17 slices shown (1 of 2)]
[im 1/17]
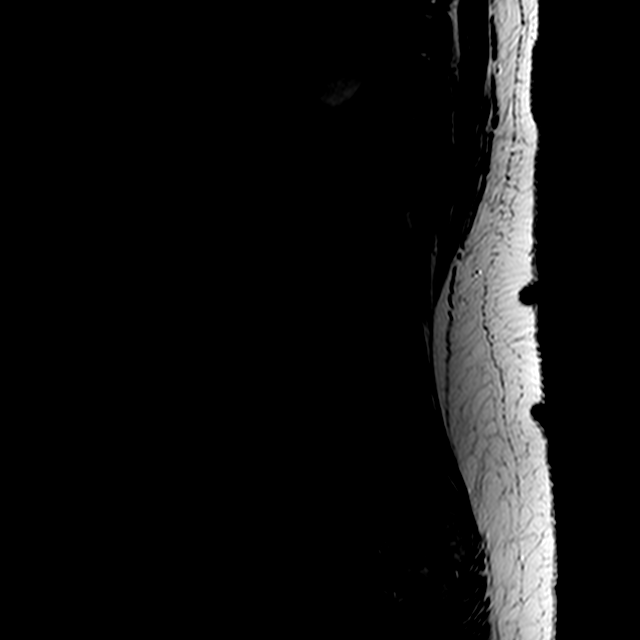
[im 4/17]
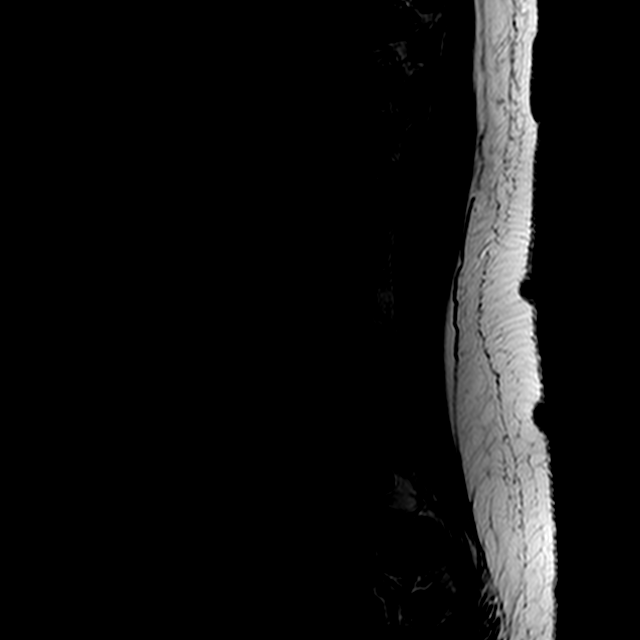
[im 10/17]
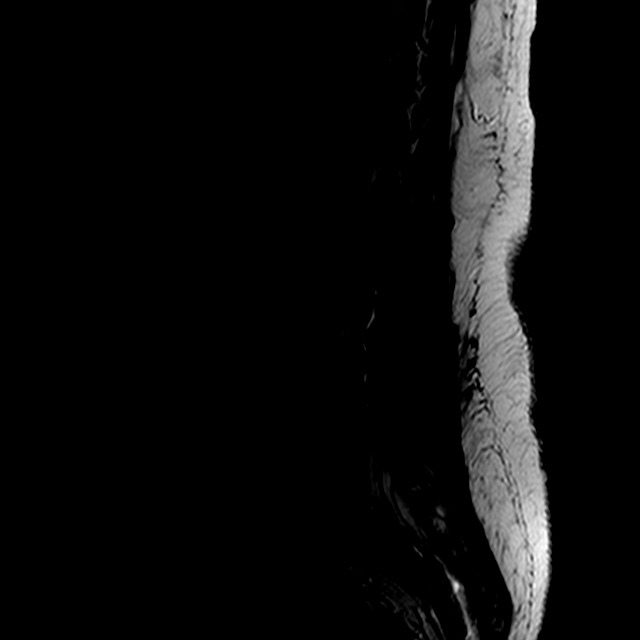
[im 17/17]
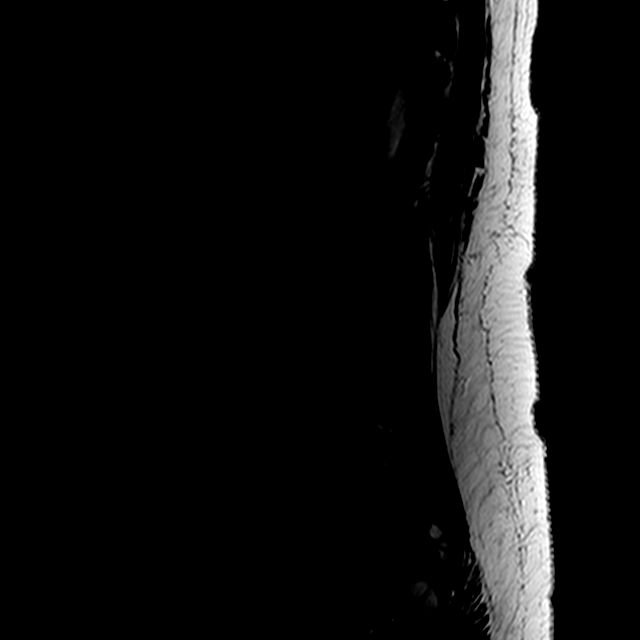

[Series 3: T1 · sagittal · 4.0mm · 0.44mm/px · 3 of 17 slices shown (1 of 2)]
[im 3/17]
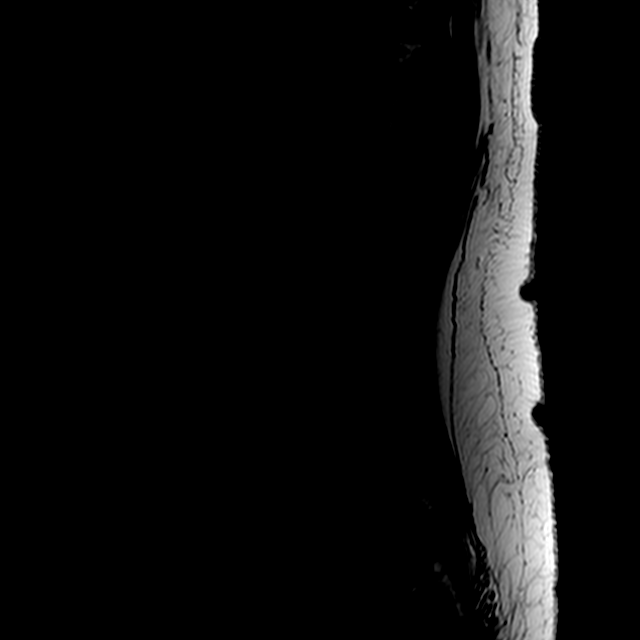
[im 9/17]
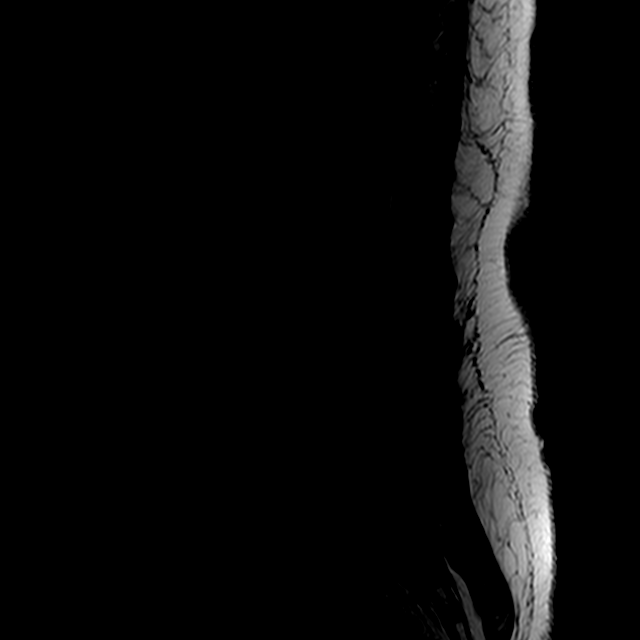
[im 14/17]
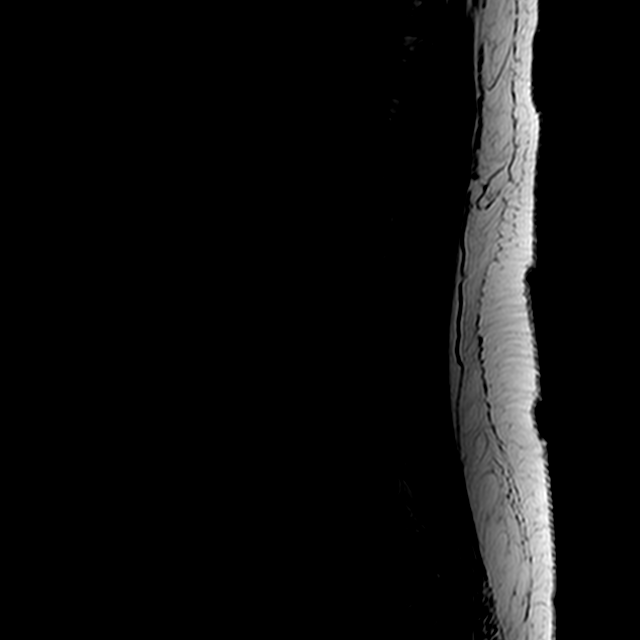

[Series 5: T2 · axial · 4.0mm · 0.39mm/px · z∈[-94,+46]mm · 3 of 33 slices shown (2 of 2)]
[im 5/33]
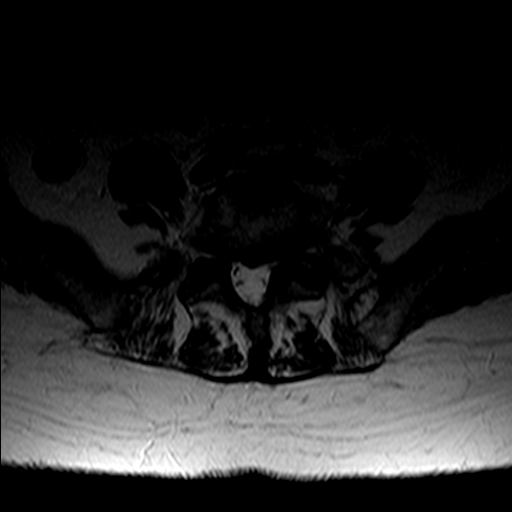
[im 18/33]
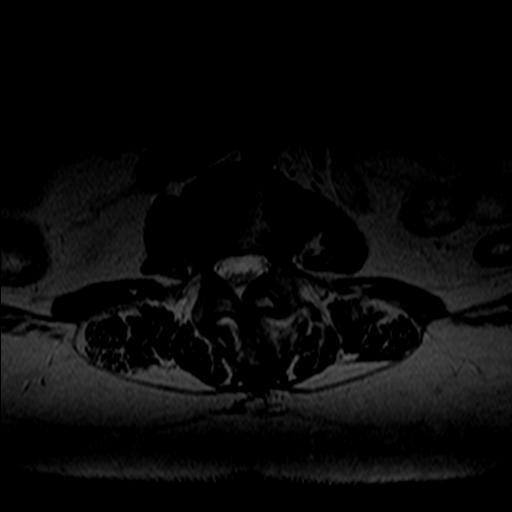
[im 28/33]
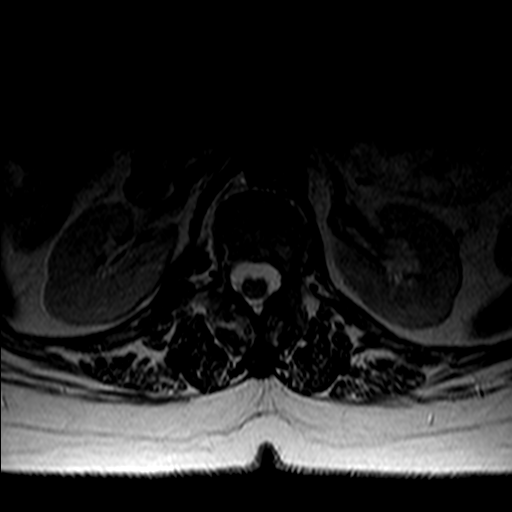

[Series 6: T1 · axial · 4.0mm · 0.39mm/px · z∈[-94,+46]mm · 3 of 33 slices shown (2 of 2)]
[im 5/33]
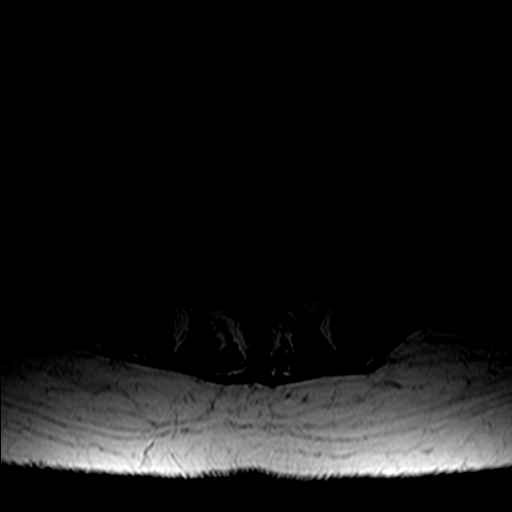
[im 18/33]
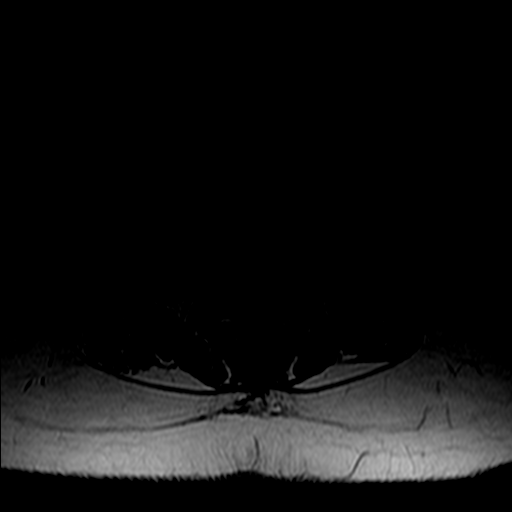
[im 28/33]
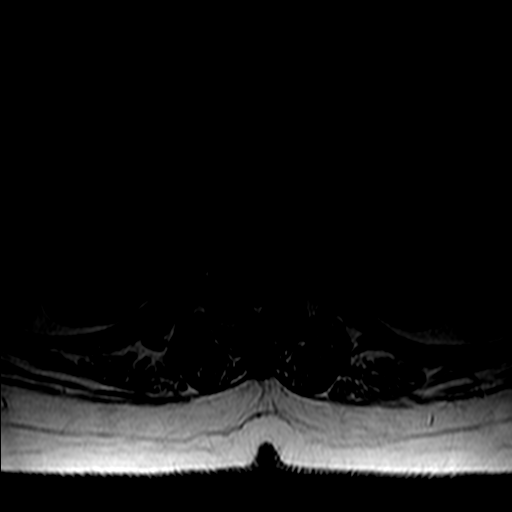

[13 of 48 positions shown; findings below may reference images not displayed]

FINDINGS: Segmentation:  Normal as demonstrated on the comparison CT.

Alignment: Stable lumbar lordosis since 8002. Mild superimposed
dextroconvex lumbar scoliosis.

Vertebrae: Diffuse chronic lumbar endplate degeneration.
Superimposed degenerative appearing endplate marrow edema at L2-L3
(series 4, image 9), and to a lesser extent at L3-L4 and L5-S1. No
other No acute osseous abnormality identified. Intact visible sacrum
and SI joints.

Conus medullaris and cauda equina: Conus extends to the L1-L2 level.
Conus and cauda equina appear normal.

Paraspinal and other soft tissues: Negative.

Disc levels:

T11-T12: Circumferential disc bulge with mild to moderate facet
hypertrophy. No significant spinal stenosis, and no visible lower
thoracic spinal stenosis elsewhere. However there is at least
moderate bilateral T11 foraminal stenosis.

T12-L1:  Negative.

L1-L2: Disc space loss with right eccentric circumferential disc
bulge. Broad-based right subarticular and foraminal component. No
significant spinal stenosis. Mild right lateral recess (descending
right L2 nerve root level) and right L1 neural foraminal stenosis.

L2-L3: Disc space loss with left eccentric circumferential disc
bulge. Left far lateral bulky disc bulge and endplate spurring. Mild
facet and ligament flavum hypertrophy greater on the left. No
significant spinal stenosis. Moderate to severe left L2 neural
foraminal and mild to moderate left lateral recess stenosis
(descending left L3 nerve root level).

L3-L4: Severe disc space loss. Circumferential disc bulge and
endplate spurring with broad-based posterior component in the
midline. Moderate facet and ligament flavum hypertrophy greater on
the right. Chronic mild to moderate spinal stenosis at this level
has not significantly changed since 5215. Associated mild to
moderate bilateral lateral recess stenosis (descending L4 nerve root
levels) and moderate to severe bilateral L3 neural foraminal
stenosis, greater on the left.

L4-L5: Severe disc space loss. Circumferential disc bulge with
broad-based left foraminal disc and endplate spurring. Moderate
facet and ligament flavum hypertrophy. Regressed broad-based disc
protrusion here since 5215. No spinal stenosis. Mild bilateral
lateral recess stenosis (descending L5 nerve root levels). Moderate
bilateral L4 foraminal stenosis.

L5-S1: Severe disc space loss. Circumferential disc osteophyte
complex with broad-based posterior component. Moderate facet
hypertrophy. No spinal stenosis. Mild to moderate left lateral
recess stenosis (descending left S1 nerve root level). Moderate to
severe bilateral L5 foraminal stenosis.
IMPRESSION: 1. Chronically advanced lumbar disc and endplate degeneration.
Superimposed acute degenerative endplate marrow edema at L2-L3, and
lesser degenerative endplate edema at L3-L4 and L5-S1.
2. Chronic multifactorial mild to moderate spinal stenosis at L3-L4
has not significantly changed since 5215. Associated moderate
bilateral lateral recess and moderate to severe bilateral neural
foraminal stenosis at that level. Query L3 and/or L4 radiculitis.
3. No other significant lumbar spinal stenosis. Predominately mild
lateral recess stenosis elsewhere, although moderate at the left L3
and left S1 nerve root levels.
4. Moderate or severe neural foraminal stenosis also at the
bilateral L4 and L5 nerve levels.

## 2019-10-09 ENCOUNTER — Ambulatory Visit: Payer: BC Managed Care – PPO

## 2019-10-11 ENCOUNTER — Ambulatory Visit: Payer: BC Managed Care – PPO | Attending: Internal Medicine

## 2019-10-11 ENCOUNTER — Ambulatory Visit: Payer: BC Managed Care – PPO

## 2019-10-11 DIAGNOSIS — Z23 Encounter for immunization: Secondary | ICD-10-CM

## 2019-10-11 NOTE — Progress Notes (Signed)
   Covid-19 Vaccination Clinic  Name:  Haley Jones    MRN: [id number] DOB: 1955-08-29  10/11/2019  [name] was observed post Covid-19 immunization for 15 minutes without incidence. She was provided with Vaccine Information Sheet and instruction to access the V-Safe system.   [name] was instructed to call 911 with any severe reactions post vaccine: Marland Kitchen Difficulty breathing  . Swelling of your face and throat  . A fast heartbeat  . A bad rash all over your body  . Dizziness and weakness    Immunizations Administered    Name Date Dose VIS Date Route   Moderna COVID-19 Vaccine 10/11/2019  2:41 PM 0.5 mL 07/15/2019 Intramuscular   Manufacturer: Moderna   Lot: [id number]   [id number]

## 2019-11-08 ENCOUNTER — Ambulatory Visit: Payer: BC Managed Care – PPO

## 2019-11-08 ENCOUNTER — Ambulatory Visit: Payer: BC Managed Care – PPO | Attending: Internal Medicine

## 2019-11-08 DIAGNOSIS — Z23 Encounter for immunization: Secondary | ICD-10-CM

## 2019-11-08 NOTE — Progress Notes (Signed)
   Covid-19 Vaccination Clinic  Name:  Lanasia Pytlik    MRN: [id number] DOB: [birth year]  11/08/2019  Ms. Sherrick was observed post Covid-19 immunization for 15 minutes without incident. She was provided with Vaccine Information Sheet and instruction to access the V-Safe system.   Ms. Larios was instructed to call 911 with any severe reactions post vaccine: Marland Kitchen Difficulty breathing  . Swelling of face and throat  . A fast heartbeat  . A bad rash all over body  . Dizziness and weakness   Immunizations Administered    Name Date Dose VIS Date Route   Moderna COVID-19 Vaccine 11/08/2019  1:43 PM 0.5 mL 07/15/2019 Intramuscular   Manufacturer: Levan Hurst   LotFY:[id number]   [id number]

## 2021-02-23 DIAGNOSIS — E538 Deficiency of other specified B group vitamins: Secondary | ICD-10-CM | POA: Diagnosis present

## 2022-05-11 ENCOUNTER — Other Ambulatory Visit: Payer: Self-pay | Admitting: Internal Medicine

## 2022-05-11 DIAGNOSIS — N289 Disorder of kidney and ureter, unspecified: Secondary | ICD-10-CM

## 2022-05-16 ENCOUNTER — Ambulatory Visit
Admission: RE | Admit: 2022-05-16 | Discharge: 2022-05-16 | Disposition: A | Discharge status: Home / Self Care | Payer: BC Managed Care – PPO | Source: Ambulatory Visit | Attending: Internal Medicine | Admitting: Internal Medicine

## 2022-05-16 DIAGNOSIS — N289 Disorder of kidney and ureter, unspecified: Secondary | ICD-10-CM | POA: Diagnosis present

## 2022-07-31 ENCOUNTER — Emergency Department: Payer: BC Managed Care – PPO

## 2022-07-31 ENCOUNTER — Encounter: Payer: Self-pay | Admitting: Emergency Medicine

## 2022-07-31 ENCOUNTER — Other Ambulatory Visit: Payer: Self-pay

## 2022-07-31 ENCOUNTER — Emergency Department
Admission: EM | Admit: 2022-07-31 | Discharge: 2022-07-31 | Disposition: A | Discharge status: Home / Self Care | Payer: BC Managed Care – PPO | Source: Home / Self Care | Attending: Emergency Medicine | Admitting: Emergency Medicine

## 2022-07-31 ENCOUNTER — Emergency Department
Admission: EM | Admit: 2022-07-31 | Discharge: 2022-07-31 | Disposition: A | Discharge status: Home / Self Care | Payer: BC Managed Care – PPO | Attending: Student in an Organized Health Care Education/Training Program | Admitting: Student in an Organized Health Care Education/Training Program

## 2022-07-31 DIAGNOSIS — W19XXXA Unspecified fall, initial encounter: Secondary | ICD-10-CM | POA: Insufficient documentation

## 2022-07-31 DIAGNOSIS — Z1152 Encounter for screening for COVID-19: Secondary | ICD-10-CM | POA: Insufficient documentation

## 2022-07-31 DIAGNOSIS — R739 Hyperglycemia, unspecified: Secondary | ICD-10-CM

## 2022-07-31 DIAGNOSIS — Z9181 History of falling: Secondary | ICD-10-CM | POA: Diagnosis not present

## 2022-07-31 DIAGNOSIS — E119 Type 2 diabetes mellitus without complications: Secondary | ICD-10-CM | POA: Insufficient documentation

## 2022-07-31 DIAGNOSIS — M542 Cervicalgia: Secondary | ICD-10-CM | POA: Insufficient documentation

## 2022-07-31 DIAGNOSIS — I159 Secondary hypertension, unspecified: Secondary | ICD-10-CM | POA: Insufficient documentation

## 2022-07-31 DIAGNOSIS — R519 Headache, unspecified: Secondary | ICD-10-CM | POA: Insufficient documentation

## 2022-07-31 DIAGNOSIS — N390 Urinary tract infection, site not specified: Secondary | ICD-10-CM | POA: Insufficient documentation

## 2022-07-31 DIAGNOSIS — R296 Repeated falls: Secondary | ICD-10-CM | POA: Insufficient documentation

## 2022-07-31 DIAGNOSIS — E1165 Type 2 diabetes mellitus with hyperglycemia: Secondary | ICD-10-CM | POA: Diagnosis not present

## 2022-07-31 LAB — COMPREHENSIVE METABOLIC PANEL
ALT: 16 U/L (ref 0–44)
AST: 14 U/L — ABNORMAL LOW (ref 15–41)
Albumin: 3.7 g/dL (ref 3.5–5.0)
Alkaline Phosphatase: 59 U/L (ref 38–126)
Anion gap: 8 (ref 5–15)
BUN: 11 mg/dL (ref 8–23)
CO2: 24 mmol/L (ref 22–32)
Calcium: 9 mg/dL (ref 8.9–10.3)
Chloride: 101 mmol/L (ref 98–111)
Creatinine, Ser: 1.27 mg/dL — ABNORMAL HIGH (ref 0.44–1.00)
GFR, Estimated: 47 mL/min — ABNORMAL LOW (ref >60)
Glucose, Bld: 566 mg/dL (ref 70–99)
Potassium: 3.8 mmol/L (ref 3.5–5.1)
Sodium: 133 mmol/L — ABNORMAL LOW (ref 135–145)
Total Bilirubin: 0.7 mg/dL (ref 0.3–1.2)
Total Protein: 6.8 g/dL (ref 6.5–8.1)

## 2022-07-31 LAB — CBC WITH DIFFERENTIAL/PLATELET
Abs Immature Granulocytes: 0.01 10*3/uL (ref 0.00–0.07)
Basophils Absolute: 0 10*3/uL (ref 0.0–0.1)
Basophils Relative: 0 %
Eosinophils Absolute: 0.1 10*3/uL (ref 0.0–0.5)
Eosinophils Relative: 1 %
HCT: 36.6 % (ref 36.0–46.0)
Hemoglobin: 11.9 g/dL — ABNORMAL LOW (ref 12.0–15.0)
Immature Granulocytes: 0 %
Lymphocytes Relative: 30 %
Lymphs Abs: 1.5 10*3/uL (ref 0.7–4.0)
MCH: 30 pg (ref 26.0–34.0)
MCHC: 32.5 g/dL (ref 30.0–36.0)
MCV: 92.2 fL (ref 80.0–100.0)
Monocytes Absolute: 0.3 10*3/uL (ref 0.1–1.0)
Monocytes Relative: 6 %
Neutro Abs: 3.2 10*3/uL (ref 1.7–7.7)
Neutrophils Relative %: 63 %
Platelets: 162 10*3/uL (ref 150–400)
RBC: 3.97 MIL/uL (ref 3.87–5.11)
RDW: 11.7 % (ref 11.5–15.5)
WBC: 5.2 10*3/uL (ref 4.0–10.5)
nRBC: 0 % (ref 0.0–0.2)

## 2022-07-31 LAB — URINALYSIS, ROUTINE W REFLEX MICROSCOPIC
Bilirubin Urine: NEGATIVE
Glucose, UA: >=500 mg/dL — AB
Ketones, ur: NEGATIVE mg/dL
Leukocytes,Ua: NEGATIVE
Nitrite: NEGATIVE
Protein, ur: NEGATIVE mg/dL
Specific Gravity, Urine: 1.025 (ref 1.005–1.030)
pH: 6 (ref 5.0–8.0)

## 2022-07-31 LAB — RESP PANEL BY RT-PCR (RSV, FLU A&B, COVID)  RVPGX2
Influenza A by PCR: NEGATIVE
Influenza B by PCR: NEGATIVE
Resp Syncytial Virus by PCR: NEGATIVE
SARS Coronavirus 2 by RT PCR: NEGATIVE

## 2022-07-31 LAB — TROPONIN I (HIGH SENSITIVITY)
Troponin I (High Sensitivity): 8 ng/L (ref <18)
Troponin I (High Sensitivity): 8 ng/L (ref <18)

## 2022-07-31 LAB — CBG MONITORING, ED
Glucose-Capillary: 481 mg/dL — ABNORMAL HIGH (ref 70–99)
Glucose-Capillary: 387 mg/dL — ABNORMAL HIGH (ref 70–99)
Glucose-Capillary: 159 mg/dL — ABNORMAL HIGH (ref 70–99)
Glucose-Capillary: 81 mg/dL (ref 70–99)

## 2022-07-31 MED ORDER — INSULIN ASPART 100 UNIT/ML IJ SOLN
10.0000 [IU] | Freq: Once | INTRAMUSCULAR | Status: AC
  Administered 2022-07-31: 10 [IU] via SUBCUTANEOUS
  Filled 2022-07-31: qty 1

## 2022-07-31 MED ORDER — AMLODIPINE BESYLATE 5 MG PO TABS
5.0000 mg | ORAL_TABLET | Freq: Once | ORAL | Status: AC
  Administered 2022-07-31: 5 mg via ORAL
  Filled 2022-07-31: qty 1

## 2022-07-31 MED ORDER — LACTATED RINGERS IV SOLN: INTRAVENOUS | Status: DC

## 2022-07-31 MED ORDER — ACETAMINOPHEN 500 MG PO TABS
1000.0000 mg | ORAL_TABLET | Freq: Once | ORAL | Status: AC
  Administered 2022-07-31: 1000 mg via ORAL
  Filled 2022-07-31: qty 2

## 2022-07-31 MED ORDER — INSULIN ASPART 100 UNIT/ML IJ SOLN
6.0000 [IU] | Freq: Once | INTRAMUSCULAR | Status: AC
  Administered 2022-07-31: 6 [IU] via SUBCUTANEOUS
  Filled 2022-07-31: qty 1

## 2022-07-31 MED ORDER — LISINOPRIL 10 MG PO TABS: 10.0000 mg | ORAL_TABLET | Freq: Once | ORAL | Status: DC

## 2022-07-31 MED ORDER — SODIUM CHLORIDE 0.9 % IV BOLUS
1000.0000 mL | Freq: Once | INTRAVENOUS | Status: AC
  Administered 2022-07-31: 1000 mL via INTRAVENOUS

## 2022-07-31 MED ORDER — CEPHALEXIN 500 MG PO CAPS: 500.0000 mg | ORAL_CAPSULE | Freq: Three times a day (TID) | ORAL | 0 refills | Status: AC

## 2022-07-31 MED ORDER — TRULICITY 0.75 MG/0.5ML ~~LOC~~ SOAJ: 0.7500 mg | SUBCUTANEOUS | 0 refills | Status: DC

## 2022-07-31 MED ORDER — GLIMEPIRIDE 2 MG PO TABS: 2.0000 mg | ORAL_TABLET | ORAL | 11 refills | Status: DC

## 2022-07-31 MED ORDER — DEXAMETHASONE SODIUM PHOSPHATE 10 MG/ML IJ SOLN: 10.0000 mg | Freq: Once | INTRAMUSCULAR | Status: DC

## 2022-07-31 MED ORDER — AMLODIPINE BESYLATE 5 MG PO TABS: 5.0000 mg | ORAL_TABLET | Freq: Every day | ORAL | 11 refills | Status: DC

## 2022-07-31 NOTE — ED Notes (Signed)
Pt not orthostatic - notified provider.

## 2022-07-31 NOTE — ED Provider Notes (Signed)
[facility] Provider Note    Event Date/Time   First MD Initiated Contact with Patient 07/31/22 1801     (approximate)   History   Fall   HPI  Haley Jones is a 66 y.o. female  with PMHx DM, frequent falls, here with fall. Pt was just seen and treated in the ED. She was hyperglycemic at that time and given fluids, otherwise had a very reassuring work up. She wanted to go home to be with her daughter, who has special needs. However, upon walking to her car she had another fall. Struck her face. She has headache, paraspinal neck pain but no midline pain. No focal numbness or weakness. She states she has fallen 3x today alone. No focal deficits. She does admit she had not been taking her medications as she should over the past few days.      Physical Exam   Triage Vital Signs: ED Triage Vitals [07/31/22 1600]  Enc Vitals Group     BP (!) 155/84     Pulse Rate 98     Resp 18     Temp 98.5 F (36.9 C)     Temp Source Oral     SpO2 99 %     Weight 155 lb (70.3 kg)     Height      Head Circumference      Peak Flow      Pain Score 7     Pain Loc      Pain Edu?      Excl. in [place]?     Most recent vital signs: Vitals:   07/31/22 1600 07/31/22 2123  BP: (!) 155/84 (!) 149/80  Pulse: 98 88  Resp: 18 18  Temp: 98.5 F (36.9 C)   SpO2: 99% 97%     General: Awake, no distress.  CV:  Good peripheral perfusion. Regular rate and rhythm. Resp:  Normal effort. Chest non tender. Abd:  No distention. No tenderness. Other:  Superficial abrasion R chin and lower arm. No deformity. CNII-XII intact though slight tremor noted on right. Strength 5/5 bilateral UE and Le. Normal sensation to light touch.   ED Results / Procedures / Treatments   Labs (all labs ordered are listed, but only abnormal results are displayed) Labs Reviewed  RESP PANEL BY RT-PCR (RSV, FLU A&B, COVID)  [id number]  CBG MONITORING, ED  CBG MONITORING, ED  TROPONIN I (HIGH  SENSITIVITY)  TROPONIN I (HIGH SENSITIVITY)     EKG Normal sinus rhythm, VR 87. PR 138, QRS 70, QTc 442. No acute ST elevations or depressions. No ischemia or infarct.   RADIOLOGY CT head: NAICA CT C Spine: No acute abnormality Dg Knee R: Negative   I also independently reviewed and agree with radiologist interpretations.   PROCEDURES:  Critical Care performed: No   MEDICATIONS ORDERED IN ED: Medications  lactated ringers infusion (0 mLs Intravenous Stopped 07/31/22 2126)  acetaminophen (TYLENOL) tablet 1,000 mg (1,000 mg Oral Given 07/31/22 2123)     IMPRESSION / MDM / ASSESSMENT AND PLAN / ED COURSE  I reviewed the triage vital signs and the nursing notes.                              Differential diagnosis includes, but is not limited to, mechanical fall, ongoing imbalance/dconditioning, possible UTI, now possible intracranial injury, cervical injury.  Patient's presentation is most consistent with acute presentation with  potential threat to life or bodily function.   66 yo F here with recurrent fall. Pt had just been seen and medically cleared, states she had been feeling better then tripped going uphill to her car. She has nwe abrasions but no focal TTP anywhere. CT imaging negative. I reviewed her PCP note from today and earlier ED visit with labs. The only significant abnormality I noted was small amount of pyuria - she's had some mild sx so will tx but unclear what is causing her falls. Likely multifactorial. Given her significant imbalance issues and some neck pain, MRI obtained, reviewed. No acute abnormality noted on MR Brain. MR C spine has significant stenosis but no acute injury or ischemia. She is NVI distally.  I discussed findings, concerns with pt and recommended she be observed in the hospital given her falls. She has a daughter at home who she needs to take care of. A friend/family member is here and states he can help her at home. She will call her PCP  in the AM to discuss and set up PT/follow-up. No apparent emergent pathology and pt does not wish to stay.   FINAL CLINICAL IMPRESSION(S) / ED DIAGNOSES   Final diagnoses:  Fall, initial encounter  Lower urinary tract infectious disease     Rx / DC Orders   ED Discharge Orders          Ordered    cephALEXin (KEFLEX) 500 MG capsule  3 times daily        07/31/22 2101             Note:  This document was prepared using Dragon voice recognition software and may include unintentional dictation errors.   Duffy Bruce, MD 07/31/22 2222

## 2022-07-31 NOTE — Discharge Instructions (Addendum)
Start taking your medications as prescribed  Follow-up with your doctor  Take the antibiotic as prescribed

## 2022-07-31 NOTE — ED Notes (Signed)
To MRI - fluids paused.

## 2022-07-31 NOTE — ED Triage Notes (Signed)
Pt fell out in the parking lot of [place]. Pt has redness to right eye after her glasses broke. Pt has right knee pain also with a skin tear on her right hand.

## 2022-07-31 NOTE — ED Provider Triage Note (Signed)
Emergency Medicine Provider Triage Evaluation Note  Haley Jones , a 66 y.o. female  was evaluated in triage.  Pt complains of frequent falls. She fell again today.  She denies striking her head or losing consciousness.  She is not currently on any blood thinners.  Physical Exam  BP (!) 183/96 (BP Location: Right Arm)   Pulse 91   Temp 98.2 F (36.8 C) (Oral)   Resp 18   SpO2 100%  Gen:   Awake, no distress   Resp:  Normal effort  MSK:   Moves extremities without difficulty  Other:    Medical Decision Making  Medically screening exam initiated at 11:21 AM.  Appropriate orders placed.  [name] was informed that the remainder of the evaluation will be completed by another provider, this initial triage assessment does not replace that evaluation, and the importance of remaining in the ED until their evaluation is complete.    Victorino Dike, FNP 07/31/22 1919

## 2022-07-31 NOTE — ED Triage Notes (Signed)
Pt presents to the ED via POV from [facility] due to multiple falls. Pt states she is unsure on why she keeps falling. Pt denies LOC and blood thinners. Pt A&Ox4

## 2022-07-31 NOTE — ED Notes (Signed)
Pt given snack with MD permission

## 2022-07-31 NOTE — ED Provider Notes (Signed)
[facility] Provider Note    Event Date/Time   First MD Initiated Contact with Patient 07/31/22 1134     (approximate)   History   Fall   HPI  Haley Jones is a 66 y.o. female   a history of diabetes presents to the ER for evaluation of frequent falls over the past few weeks.  Her daughter is recent been in the hospital has a history of seizures.  Patient missed placed her medications and has not been taking any of her medications for past several days.  Glucose has been running high.  She denies any headache.  Feels like symptoms are worse when she stands up quickly.  Does not recall hitting her head.  Denies any numbness or tingling no chest pain or shortness of breath no nausea or vomiting.  She is adamant that she must be released from the ER at 3:00 to be able to care for her daughter.  She was sent to the ER by PCP.       Physical Exam   Triage Vital Signs: ED Triage Vitals  Enc Vitals Group     BP 07/31/22 1113 (!) 183/96     Pulse Rate 07/31/22 1113 91     Resp 07/31/22 1113 18     Temp 07/31/22 1113 98.2 F (36.8 C)     Temp Source 07/31/22 1113 Oral     SpO2 07/31/22 1113 100 %     Weight 07/31/22 1149 180 lb 1.9 oz (81.7 kg)     Height 07/31/22 1149 '4\' 9"'$  (1.448 m)     Head Circumference --      Peak Flow --      Pain Score 07/31/22 1116 0     Pain Loc --      Pain Edu? --      Excl. in [place]? --     Most recent vital signs: Vitals:   07/31/22 1333 07/31/22 1334  BP: (!) 178/91 (!) 187/92  Pulse: 81 80  Resp: 20   Temp:    SpO2:       Constitutional: Alert  Eyes: Conjunctivae are normal.  Head: Atraumatic. Nose: No congestion/rhinnorhea. Mouth/Throat: Mucous membranes are moist.   Neck: Painless ROM.  Cardiovascular:   Good peripheral circulation. Respiratory: Normal respiratory effort.  No retractions.  Gastrointestinal: Soft and nontender.  Musculoskeletal:  no deformity Neurologic:  CN- intact, chronic  exotropia.  No facial droop, Normal FNF.  Normal heel to shin.  Sensation intact bilaterally. Normal speech and language. No gross focal neurologic deficits are appreciated. No gait instability. Skin:  Skin is warm, dry and intact. No rash noted. Psychiatric: Mood and affect are normal. Speech and behavior are normal.    ED Results / Procedures / Treatments   Labs (all labs ordered are listed, but only abnormal results are displayed) Labs Reviewed  COMPREHENSIVE METABOLIC PANEL - Abnormal; Notable for the following components:      Result Value   Sodium 133 (*)    Glucose, Bld 566 (*)    Creatinine, Ser 1.27 (*)    AST 14 (*)    GFR, Estimated 47 (*)    All other components within normal limits  CBC WITH DIFFERENTIAL/PLATELET - Abnormal; Notable for the following components:   Hemoglobin 11.9 (*)    All other components within normal limits  URINALYSIS, ROUTINE W REFLEX MICROSCOPIC - Abnormal; Notable for the following components:   Color, Urine STRAW (*)  APPearance CLEAR (*)    Glucose, UA >=500 (*)    Hgb urine dipstick SMALL (*)    Bacteria, UA RARE (*)    All other components within normal limits  CBG MONITORING, ED - Abnormal; Notable for the following components:   Glucose-Capillary 481 (*)    All other components within normal limits  CBG MONITORING, ED - Abnormal; Notable for the following components:   Glucose-Capillary 387 (*)    All other components within normal limits     EKG  ED ECG REPORT I, Merlyn Lot, the attending physician, personally viewed and interpreted this ECG.   Date: 07/31/2022  EKG Time: 11:18  Rate: 90  Rhythm: sinus  Axis: normal  Intervals: normal  ST&T Change: no stemi, no depressions    RADIOLOGY Please see ED Course for my review and interpretation.  I personally reviewed all radiographic images ordered to evaluate for the above acute complaints and reviewed radiology reports and findings.  These findings were  personally discussed with the patient.  Please see medical record for radiology report.    PROCEDURES:  Critical Care performed: No  Procedures   MEDICATIONS ORDERED IN ED: Medications  sodium chloride 0.9 % bolus 1,000 mL (0 mLs Intravenous Stopped 07/31/22 1328)  insulin aspart (novoLOG) injection 10 Units (10 Units Subcutaneous Given 07/31/22 1221)  amLODipine (NORVASC) tablet 5 mg (5 mg Oral Given 07/31/22 1353)  insulin aspart (novoLOG) injection 6 Units (6 Units Subcutaneous Given 07/31/22 1409)     IMPRESSION / MDM / ASSESSMENT AND PLAN / ED COURSE  I reviewed the triage vital signs and the nursing notes.                              Differential diagnosis includes, but is not limited to, Dehydration, sepsis, pna, uti, hypoglycemia, cva, drug effect,   Patient presenting to the ER for evaluation of symptoms as described above.  Based on symptoms, risk factors and considered above differential, this presenting complaint could reflect a potentially life-threatening illness therefore the patient will be placed on continuous pulse oximetry and telemetry for monitoring.  Laboratory evaluation will be sent to evaluate for the above complaints.  Patient without any focal neurodeficits.  CT imaging will be ordered.  I have lower suspicion for CVA or mass.  Doubt cardiac etiology.    Clinical Course as of 07/31/22 1457  Mon Jul 31, 2022  1200 CT head on my review and interpretation without evidence of acute intercranial abnormality. [PR]  1451 Patient reassessed.  Feels well.  Ambulates with steady gait.  Not orthostatic.  Suspect a lot of her symptoms are secondary to noncompliance medications hyperglycemia.  Discussed option for hospitalization with patient states she needs to be home to care for her daughter.  Given duration of symptoms I think that outpatient follow-up is reasonable.  We discussed strict return precautions. [PR]    Clinical Course User Index [PR] Merlyn Lot, MD   st FINAL CLINICAL IMPRESSION(S) / ED DIAGNOSES   Final diagnoses:  Fall, initial encounter  Hyperglycemia  Secondary hypertension     Rx / DC Orders   ED Discharge Orders          Ordered    amLODipine (NORVASC) 5 MG tablet  Daily        07/31/22 1454    glimepiride (AMARYL) 2 MG tablet  BH-each morning        07/31/22 1454  Dulaglutide (TRULICITY) 9.22 HC/0.9TV SOPN  Weekly        07/31/22 1454             Note:  This document was prepared using Dragon voice recognition software and may include unintentional dictation errors.    Merlyn Lot, MD 07/31/22 [phone]

## 2022-07-31 NOTE — ED Triage Notes (Signed)
Pt here from [facility] clinic with staff with /co of having a fall in the parking lot. [place] J placed due to neck pain. Pt talking and texting on phone on arrival.

## 2022-07-31 NOTE — ED Provider Triage Note (Signed)
Emergency Medicine Provider Triage Evaluation Note  Haley Jones , a 66 y.o. female  was evaluated in triage.  Pt complains of fall. Pt found in [facility] parking lot after fall. Just discharged from ED, walking to car and fell again. Hit HA, R knee pain  Review of Systems  Positive: Fall, hit head, R knee pain Negative: Syncope, CP  Physical Exam  BP (!) 155/84 (BP Location: Left Arm)   Pulse 98   Temp 98.5 F (36.9 C) (Oral)   Resp 18   Wt 70.3 kg   SpO2 99%   BMI 33.54 kg/m  Gen:   Awake, no distress   Resp:  Normal effort  MSK:   Moves extremities without difficulty  Other:    Medical Decision Making  Medically screening exam initiated at 4:56 PM.  Appropriate orders placed.  Haley Jones was informed that the remainder of the evaluation will be completed by another provider, this initial triage assessment does not replace that evaluation, and the importance of remaining in the ED until their evaluation is complete.  Repeat fall, just DCed from ED. Imaging at this time   Haley Jones 07/31/22 1656

## 2022-07-31 NOTE — ED Notes (Signed)
Patient verbalizes udnerstanding of all discharge instructions. Slightly elevated Bp at discharge, pt verbalizes that she will continue to take her prescribed BP meds.   All questions answered, steady gait to lobby

## 2022-08-01 LAB — CBG MONITORING, ED: Glucose-Capillary: 156 mg/dL — ABNORMAL HIGH (ref 70–99)

## 2022-11-17 DIAGNOSIS — F330 Major depressive disorder, recurrent, mild: Secondary | ICD-10-CM | POA: Insufficient documentation

## 2022-11-17 DIAGNOSIS — E1169 Type 2 diabetes mellitus with other specified complication: Secondary | ICD-10-CM | POA: Diagnosis present

## 2023-12-20 ENCOUNTER — Other Ambulatory Visit: Payer: Self-pay

## 2023-12-20 ENCOUNTER — Observation Stay (HOSPITAL_COMMUNITY)
Admit: 2023-12-20 | Discharge: 2023-12-20 | Disposition: A | Discharge status: Home / Self Care | Attending: Internal Medicine | Admitting: Internal Medicine

## 2023-12-20 ENCOUNTER — Observation Stay

## 2023-12-20 ENCOUNTER — Inpatient Hospital Stay
Admission: EM | Admit: 2023-12-20 | Discharge: 2023-12-28 | DRG: 065 | Disposition: A | Discharge status: Rehabilitation Facility | Attending: Internal Medicine | Admitting: Internal Medicine

## 2023-12-20 ENCOUNTER — Emergency Department

## 2023-12-20 DIAGNOSIS — R233 Spontaneous ecchymoses: Secondary | ICD-10-CM | POA: Diagnosis present

## 2023-12-20 DIAGNOSIS — R278 Other lack of coordination: Secondary | ICD-10-CM | POA: Diagnosis present

## 2023-12-20 DIAGNOSIS — Z885 Allergy status to narcotic agent status: Secondary | ICD-10-CM

## 2023-12-20 DIAGNOSIS — E1122 Type 2 diabetes mellitus with diabetic chronic kidney disease: Secondary | ICD-10-CM | POA: Diagnosis present

## 2023-12-20 DIAGNOSIS — E1169 Type 2 diabetes mellitus with other specified complication: Secondary | ICD-10-CM | POA: Diagnosis not present

## 2023-12-20 DIAGNOSIS — G8191 Hemiplegia, unspecified affecting right dominant side: Secondary | ICD-10-CM | POA: Diagnosis present

## 2023-12-20 DIAGNOSIS — Z7985 Long-term (current) use of injectable non-insulin antidiabetic drugs: Secondary | ICD-10-CM

## 2023-12-20 DIAGNOSIS — N1831 Chronic kidney disease, stage 3a: Secondary | ICD-10-CM | POA: Diagnosis present

## 2023-12-20 DIAGNOSIS — Z880 Allergy status to penicillin: Secondary | ICD-10-CM

## 2023-12-20 DIAGNOSIS — I63312 Cerebral infarction due to thrombosis of left middle cerebral artery: Secondary | ICD-10-CM

## 2023-12-20 DIAGNOSIS — I10 Essential (primary) hypertension: Secondary | ICD-10-CM | POA: Insufficient documentation

## 2023-12-20 DIAGNOSIS — Z888 Allergy status to other drugs, medicaments and biological substances status: Secondary | ICD-10-CM

## 2023-12-20 DIAGNOSIS — R296 Repeated falls: Secondary | ICD-10-CM | POA: Diagnosis present

## 2023-12-20 DIAGNOSIS — E876 Hypokalemia: Secondary | ICD-10-CM | POA: Diagnosis present

## 2023-12-20 DIAGNOSIS — I6389 Other cerebral infarction: Secondary | ICD-10-CM

## 2023-12-20 DIAGNOSIS — I63512 Cerebral infarction due to unspecified occlusion or stenosis of left middle cerebral artery: Secondary | ICD-10-CM | POA: Diagnosis not present

## 2023-12-20 DIAGNOSIS — E538 Deficiency of other specified B group vitamins: Secondary | ICD-10-CM | POA: Diagnosis not present

## 2023-12-20 DIAGNOSIS — R809 Proteinuria, unspecified: Secondary | ICD-10-CM | POA: Diagnosis present

## 2023-12-20 DIAGNOSIS — R414 Neurologic neglect syndrome: Secondary | ICD-10-CM | POA: Diagnosis present

## 2023-12-20 DIAGNOSIS — E1165 Type 2 diabetes mellitus with hyperglycemia: Secondary | ICD-10-CM | POA: Diagnosis present

## 2023-12-20 DIAGNOSIS — R471 Dysarthria and anarthria: Secondary | ICD-10-CM | POA: Diagnosis present

## 2023-12-20 DIAGNOSIS — N179 Acute kidney failure, unspecified: Secondary | ICD-10-CM | POA: Diagnosis present

## 2023-12-20 DIAGNOSIS — G8929 Other chronic pain: Secondary | ICD-10-CM | POA: Diagnosis present

## 2023-12-20 DIAGNOSIS — Z79899 Other long term (current) drug therapy: Secondary | ICD-10-CM

## 2023-12-20 DIAGNOSIS — N183 Chronic kidney disease, stage 3 unspecified: Secondary | ICD-10-CM

## 2023-12-20 DIAGNOSIS — Z634 Disappearance and death of family member: Secondary | ICD-10-CM

## 2023-12-20 DIAGNOSIS — Z7984 Long term (current) use of oral hypoglycemic drugs: Secondary | ICD-10-CM

## 2023-12-20 DIAGNOSIS — F419 Anxiety disorder, unspecified: Secondary | ICD-10-CM | POA: Diagnosis present

## 2023-12-20 DIAGNOSIS — R29710 NIHSS score 10: Secondary | ICD-10-CM | POA: Diagnosis not present

## 2023-12-20 DIAGNOSIS — F32A Depression, unspecified: Secondary | ICD-10-CM | POA: Diagnosis present

## 2023-12-20 DIAGNOSIS — E782 Mixed hyperlipidemia: Secondary | ICD-10-CM | POA: Diagnosis present

## 2023-12-20 DIAGNOSIS — J329 Chronic sinusitis, unspecified: Secondary | ICD-10-CM | POA: Diagnosis present

## 2023-12-20 DIAGNOSIS — H5340 Unspecified visual field defects: Secondary | ICD-10-CM | POA: Diagnosis present

## 2023-12-20 DIAGNOSIS — I129 Hypertensive chronic kidney disease with stage 1 through stage 4 chronic kidney disease, or unspecified chronic kidney disease: Secondary | ICD-10-CM | POA: Diagnosis not present

## 2023-12-20 DIAGNOSIS — I639 Cerebral infarction, unspecified: Secondary | ICD-10-CM | POA: Diagnosis not present

## 2023-12-20 DIAGNOSIS — Z1152 Encounter for screening for COVID-19: Secondary | ICD-10-CM

## 2023-12-20 DIAGNOSIS — Z794 Long term (current) use of insulin: Secondary | ICD-10-CM

## 2023-12-20 DIAGNOSIS — N189 Chronic kidney disease, unspecified: Secondary | ICD-10-CM

## 2023-12-20 DIAGNOSIS — R824 Acetonuria: Secondary | ICD-10-CM | POA: Diagnosis present

## 2023-12-20 DIAGNOSIS — Z90710 Acquired absence of both cervix and uterus: Secondary | ICD-10-CM

## 2023-12-20 DIAGNOSIS — K219 Gastro-esophageal reflux disease without esophagitis: Secondary | ICD-10-CM | POA: Diagnosis present

## 2023-12-20 LAB — HIV ANTIBODY (ROUTINE TESTING W REFLEX): HIV Screen 4th Generation wRfx: NONREACTIVE

## 2023-12-20 LAB — URINALYSIS, COMPLETE (UACMP) WITH MICROSCOPIC
Bilirubin Urine: NEGATIVE
Glucose, UA: >=500 mg/dL — AB
Ketones, ur: 20 mg/dL — AB
Leukocytes,Ua: NEGATIVE
Nitrite: NEGATIVE
Protein, ur: 30 mg/dL — AB
Specific Gravity, Urine: 1.026 (ref 1.005–1.030)
pH: 5 (ref 5.0–8.0)

## 2023-12-20 LAB — CBC WITH DIFFERENTIAL/PLATELET
Abs Immature Granulocytes: 0.02 10*3/uL (ref 0.00–0.07)
Basophils Absolute: 0 10*3/uL (ref 0.0–0.1)
Basophils Relative: 0 %
Eosinophils Absolute: 0 10*3/uL (ref 0.0–0.5)
Eosinophils Relative: 0 %
HCT: 38.2 % (ref 36.0–46.0)
Hemoglobin: 13.4 g/dL (ref 12.0–15.0)
Immature Granulocytes: 0 %
Lymphocytes Relative: 17 %
Lymphs Abs: 1.3 10*3/uL (ref 0.7–4.0)
MCH: 31.9 pg (ref 26.0–34.0)
MCHC: 35.1 g/dL (ref 30.0–36.0)
MCV: 91 fL (ref 80.0–100.0)
Monocytes Absolute: 0.5 10*3/uL (ref 0.1–1.0)
Monocytes Relative: 7 %
Neutro Abs: 5.7 10*3/uL (ref 1.7–7.7)
Neutrophils Relative %: 76 %
Platelets: 220 10*3/uL (ref 150–400)
RBC: 4.2 MIL/uL (ref 3.87–5.11)
RDW: 11.8 % (ref 11.5–15.5)
WBC: 7.6 10*3/uL (ref 4.0–10.5)
nRBC: 0 % (ref 0.0–0.2)

## 2023-12-20 LAB — LIPID PANEL
Cholesterol: 170 mg/dL (ref 0–200)
HDL: 59 mg/dL (ref >40)
LDL Cholesterol: 80 mg/dL (ref 0–99)
Total CHOL/HDL Ratio: 2.9 ratio
Triglycerides: 154 mg/dL — ABNORMAL HIGH (ref <150)
VLDL: 31 mg/dL (ref 0–40)

## 2023-12-20 LAB — ECHOCARDIOGRAM COMPLETE
Area-P 1/2: 3.6 cm2
S' Lateral: 2.5 cm
Weight: 2480 [oz_av]

## 2023-12-20 LAB — COMPREHENSIVE METABOLIC PANEL WITH GFR
ALT: 23 U/L (ref 0–44)
AST: 23 U/L (ref 15–41)
Albumin: 3.7 g/dL (ref 3.5–5.0)
Alkaline Phosphatase: 77 U/L (ref 38–126)
Anion gap: 14 (ref 5–15)
BUN: 19 mg/dL (ref 8–23)
CO2: 19 mmol/L — ABNORMAL LOW (ref 22–32)
Calcium: 9.2 mg/dL (ref 8.9–10.3)
Chloride: 102 mmol/L (ref 98–111)
Creatinine, Ser: 1.61 mg/dL — ABNORMAL HIGH (ref 0.44–1.00)
GFR, Estimated: 35 mL/min — ABNORMAL LOW (ref >60)
Glucose, Bld: 262 mg/dL — ABNORMAL HIGH (ref 70–99)
Potassium: 3.5 mmol/L (ref 3.5–5.1)
Sodium: 135 mmol/L (ref 135–145)
Total Bilirubin: 1.2 mg/dL (ref 0.0–1.2)
Total Protein: 7.3 g/dL (ref 6.5–8.1)

## 2023-12-20 LAB — TROPONIN I (HIGH SENSITIVITY): Troponin I (High Sensitivity): 6 ng/L (ref <18)

## 2023-12-20 LAB — GLUCOSE, CAPILLARY: Glucose-Capillary: 116 mg/dL — ABNORMAL HIGH (ref 70–99)

## 2023-12-20 LAB — CBG MONITORING, ED: Glucose-Capillary: 243 mg/dL — ABNORMAL HIGH (ref 70–99)

## 2023-12-20 MED ORDER — ACETAMINOPHEN 160 MG/5ML PO SOLN: 650.0000 mg | ORAL | Status: DC | PRN

## 2023-12-20 MED ORDER — SENNOSIDES-DOCUSATE SODIUM 8.6-50 MG PO TABS: 1.0000 | ORAL_TABLET | Freq: Every evening | ORAL | Status: DC | PRN

## 2023-12-20 MED ORDER — ENSURE ENLIVE PO LIQD
237.0000 mL | Freq: Two times a day (BID) | ORAL | Status: DC
  Administered 2023-12-22 – 2023-12-27 (×10): 237 mL via ORAL

## 2023-12-20 MED ORDER — PANTOPRAZOLE SODIUM 40 MG PO TBEC
40.0000 mg | DELAYED_RELEASE_TABLET | Freq: Every day | ORAL | Status: DC
  Administered 2023-12-21 – 2023-12-28 (×8): 40 mg via ORAL
  Filled 2023-12-20 (×8): qty 1

## 2023-12-20 MED ORDER — STROKE: EARLY STAGES OF RECOVERY BOOK: Freq: Once | Status: AC

## 2023-12-20 MED ORDER — ASPIRIN 325 MG PO TABS
325.0000 mg | ORAL_TABLET | Freq: Once | ORAL | Status: AC
  Administered 2023-12-20: 325 mg via ORAL
  Filled 2023-12-20: qty 1

## 2023-12-20 MED ORDER — IOHEXOL 350 MG/ML SOLN
60.0000 mL | Freq: Once | INTRAVENOUS | Status: AC | PRN
  Administered 2023-12-20: 60 mL via INTRAVENOUS

## 2023-12-20 MED ORDER — ACETAMINOPHEN 650 MG RE SUPP: 650.0000 mg | RECTAL | Status: DC | PRN

## 2023-12-20 MED ORDER — CLONAZEPAM 0.5 MG PO TABS
1.0000 mg | ORAL_TABLET | Freq: Every day | ORAL | Status: DC
  Administered 2023-12-20 – 2023-12-27 (×8): 1 mg via ORAL
  Filled 2023-12-20 (×8): qty 2

## 2023-12-20 MED ORDER — VENLAFAXINE HCL ER 37.5 MG PO CP24
37.5000 mg | ORAL_CAPSULE | Freq: Every day | ORAL | Status: DC
  Administered 2023-12-21 – 2023-12-28 (×8): 37.5 mg via ORAL
  Filled 2023-12-20 (×8): qty 1

## 2023-12-20 MED ORDER — ASPIRIN 81 MG PO TBEC
81.0000 mg | DELAYED_RELEASE_TABLET | Freq: Every day | ORAL | Status: DC
  Administered 2023-12-21 – 2023-12-28 (×8): 81 mg via ORAL
  Filled 2023-12-20 (×8): qty 1

## 2023-12-20 MED ORDER — ROSUVASTATIN CALCIUM 20 MG PO TABS
20.0000 mg | ORAL_TABLET | Freq: Every day | ORAL | Status: DC
  Administered 2023-12-20 – 2023-12-28 (×9): 20 mg via ORAL
  Filled 2023-12-20 (×10): qty 1

## 2023-12-20 MED ORDER — SODIUM CHLORIDE 0.9 % IV BOLUS
1000.0000 mL | Freq: Once | INTRAVENOUS | Status: AC
  Administered 2023-12-20: 1000 mL via INTRAVENOUS

## 2023-12-20 MED ORDER — ACETAMINOPHEN 325 MG PO TABS
650.0000 mg | ORAL_TABLET | ORAL | Status: DC | PRN
  Administered 2023-12-20 – 2023-12-27 (×4): 650 mg via ORAL
  Filled 2023-12-20 (×3): qty 2

## 2023-12-20 MED ORDER — PNEUMOCOCCAL 20-VAL CONJ VACC 0.5 ML IM SUSY: 0.5000 mL | PREFILLED_SYRINGE | INTRAMUSCULAR | Status: DC

## 2023-12-20 MED ORDER — ENOXAPARIN SODIUM 40 MG/0.4ML IJ SOSY
40.0000 mg | PREFILLED_SYRINGE | INTRAMUSCULAR | Status: DC
  Administered 2023-12-20 – 2023-12-27 (×7): 40 mg via SUBCUTANEOUS
  Filled 2023-12-20 (×8): qty 0.4

## 2023-12-20 NOTE — ED Notes (Signed)
 Patient's sister joined the triage room and reports patient has had vision changes and trouble walking for one week.

## 2023-12-20 NOTE — ED Notes (Signed)
 Patient transported to MRI

## 2023-12-20 NOTE — ED Triage Notes (Signed)
 Patient states pain to right arm and right hand for 2 days, denies injury and/or trauma; also feels like her BP is elevated.

## 2023-12-20 NOTE — Assessment & Plan Note (Signed)
 -  Hold home antihypertensives to allow for permissive hypertension

## 2023-12-20 NOTE — Assessment & Plan Note (Signed)
 A1c checked 3 weeks ago, elevated 8.4%.  - Hold home regimen - SSI, moderate

## 2023-12-20 NOTE — Progress Notes (Signed)
  Echocardiogram 2D Echocardiogram has been performed.  Haley Jones 12/20/2023, 5:57 PM

## 2023-12-20 NOTE — ED Notes (Signed)
 Patient returned to room from MRI

## 2023-12-20 NOTE — ED Notes (Signed)
 MRI tech Willetta Harpin speaking to patient on phone.

## 2023-12-20 NOTE — ED Notes (Signed)
 Patient transported to CT

## 2023-12-20 NOTE — ED Notes (Signed)
 ED Provider at bedside.

## 2023-12-20 NOTE — ED Notes (Signed)
 This RN assisted patient to commode using walker.

## 2023-12-20 NOTE — Assessment & Plan Note (Signed)
 Patient has a history of CKD stage IIIa with baseline creatinine ranging between 1.2 and 1.4, now 1.61 concerning for mild AKI, especially given ketonuria on UA.  Likely due to poor p.o. 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  - S/p 1 L bolus - Push oral rehydration - Recheck BMP in the a.m.

## 2023-12-20 NOTE — ED Provider Notes (Signed)
 [facility] Provider Note    Event Date/Time   First MD Initiated Contact with Patient 12/20/23 [phone]     (approximate)  History   Chief Complaint: Extremity Weakness  HPI  Haley Jones is a 68 y.o. female with a past medical history anxiety, chronic pain, diabetes, hypertension, presents to the emergency department with her sister for worsening confusion, falls and dizziness.  According to the patient and record review she has been complaining of worsening dizziness for the past 2 to 3 weeks.  I reviewed the patient's last PCP note from 11/26/2023 which time the patient was started on meclizine for dizziness.  Sister has noted over the last few days however she has been very confused and has had multiple falls.  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  Sister says today the patient could not figure out how to use the assist lift which she has been using for many years.  During my evaluation patient answers questions mostly appropriately although is confused at times and has to ask the sister for help occasionally.  Biggest issue found on exam is patient has a a lot of difficulty distinguishing her right from her left.  For instance when asked to squeeze with her right hand she squeezes with her left and says she "cannot find" her right hand.  Ultimately the patient was able to squeeze with her right hand.  Physical Exam   Triage Vital Signs: ED Triage Vitals [12/20/23 0907]  Encounter Vitals Group     BP (!) 150/110     Systolic BP Percentile      Diastolic BP Percentile      Pulse Rate 93     Resp 20     Temp 98.4 F (36.9 C)     Temp Source Oral     SpO2 100 %     Weight      Height      Head Circumference      Peak Flow      Pain Score 8     Pain Loc      Pain Education      Exclude from Growth Chart     Most recent vital signs: Vitals:   12/20/23 0907  BP: (!) 150/110  Pulse:  93  Resp: 20  Temp: 98.4 F (36.9 C)  SpO2: 100%    General: Awake, no distress.  CV:  Good peripheral perfusion.  Regular rate and rhythm  Resp:  Normal effort.  Equal breath sounds bilaterally.  Abd:  No distention.  Soft, nontender.  No rebound or guarding. Other:  Patient has good grip strength bilaterally although has significant difficulty distinguishing her right from her left hand.  Able to lift her legs with good strength in her legs.  On visual testing it is difficult given some confusion.  Extraocular's appear intact.  Again patient has trouble when asked to look to the right or look to the left, she is able to do so but seems to have some confusion as to which way to look when I ask her to look a certain direction.   ED Results / Procedures / Treatments   EKG  EKG viewed and interpreted by myself shows a sinus rhythm at 87 bpm with a narrow QRS, normal axis, normal intervals, no concerning ST changes.  RADIOLOGY  I have reviewed and interpreted the MRI of the brain.  I do not see any large bleed on my evaluation. 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  Additional punctate left MCA territory strokes.   MEDICATIONS ORDERED IN ED: Medications - No data to display   IMPRESSION / MDM / ASSESSMENT AND PLAN / ED COURSE  I reviewed the triage vital signs and the nursing notes.  Patient's presentation is most consistent with acute presentation with potential threat to life or bodily function.  Patient presents to the emergency department for 2 to 3 weeks now of dizziness now with confusion multiple falls.  Here the patient appears confused, is having significant difficulty distinguishing her right from her left and at times says she "cannot find" her right hand.  Has same difficulty when asked to look to the right or to the left she is able to do so but has difficulty distinguishing her  deciphering which way to look when I asked her to look at certain direction.  No obvious weakness on exam this appears to be more of a confusion issue.  Unclear if this is neurologic could also be infectious or metabolic.  We will check labs including a CBC chemistry and troponin.  Will obtain an EKG we will IV hydrate we will obtain an MRI of the brain to further evaluate.  Patient and sister agreeable to plan of care.  Patient's MRI unfortunately shows several acute infarcts.  Lab work is largely nonrevealing no significant finding on urinalysis normal-appearing chemistry besides mild renal insufficiency receiving IV fluids.  CBC is normal.  Troponin negative.  Patient will be admitted to the hospital service for further workup and treatment of her acute CVAs.  FINAL CLINICAL IMPRESSION(S) / ED DIAGNOSES   CVA   Note:  This document was prepared using Dragon voice recognition software and may include unintentional dictation errors.   Ruth Cove, MD 12/20/23 1258

## 2023-12-20 NOTE — ED Notes (Signed)
 This RN attempted to obtain IV access x2 with no success; requested help from [place], [place].

## 2023-12-20 NOTE — Assessment & Plan Note (Signed)
 Severe B12 deficiency with last B12 of 175.  She was given a dose of IM at that time and started on sublingual.  Given chronicity, may benefit from checking intrinsic factor antibodies.  - Continue daily B12 supplementation

## 2023-12-20 NOTE — Assessment & Plan Note (Signed)
 Patient is presenting with multiple neurological deficits including right arm weakness/numbness, dysmetria, visual deficits.  MRI positive for multiple infarcts including larger 3 cm in the left parietal-occipital region, concerning for embolic etiology.  - Neurology consulted; appreciate their recommendations - CTA head/neck pending - Telemetry monitoring - Allow for permissive HTN (systolic < 220 and diastolic < 120)  - Echocardiogram  - ASA 325 mg once followed by/ 81 mg daily  - Start high intensity statin - PT/OT/SLP

## 2023-12-20 NOTE — H&P (Signed)
 History and Physical    Patient: Haley Jones VOZ:[id number] DOB: [birth year] DOA: 12/20/2023 DOS: the patient was seen and examined on 12/20/2023 PCP: Sari Cunning, MD  Patient coming from: Home  Chief Complaint:  Chief Complaint  Patient presents with   Extremity Weakness   HPI: Haley Jones is a 68 y.o. female with medical history significant of Hypertension, B12 deficiency, type 2 diabetes, hyperlipidemia, CKD stage IIIa, who presents to the ED due to extremity weakness.  Haley Jones states that for the last 2-3 weeks, she has been experiencing dizziness.  During this time, she has been falling more often.  She saw her PCP regarding these concerns and was started on meclizine.  Her symptoms continued though.  Her sister came to visit yesterday and noticed that when patient reached for an object, her right hand would not go to the right place.  In addition, the right arm seemed weaker and patient endorsed numbness.  Haley Jones states that she noticed the symptoms, however cannot recall when they started.  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  Haley Jones states that yesterday, she did have a bout of shortness of breath that resolved.  Otherwise, she denies any chest pain, palpitations, lower extremity swelling.  ED course: On arrival to the ED, patient was hypertensive at 150/110 with heart rate of 93.  She was saturating at 100% on room air.  She was afebrile at 98.4.  Initial workup notable for unremarkable CBC, bicarb 19, glucose 262, BUN 19, creatinine 0.61 with GFR 35.  Troponin 6.  Urinalysis with ketonuria, proteinuria, rare bacteria.  MRI of the brain demonstrates patchy acute cortical subcortical infarcts within the left parietal occipital lobe individually measuring up to 3 cm.  In addition, punctate left MCA territory acute infarct in the left insula.  Patient started on aspirin and IV fluids.  TRH  contacted for admission.   Review of Systems: As mentioned in the history of present illness. All other systems reviewed and are negative.  Past Medical History:  Diagnosis Date   Adnexal mass    Anxiety    Chronic back pain    Depression    Diabetes mellitus without complication (HCC)    Endometriosis 03/12/2018   GERD (gastroesophageal reflux disease)    Heart murmur 02/2018   undetected until she was an adult. 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   Weight loss 12/06/2017   Past Surgical History:  Procedure Laterality Date   ABDOMINAL HYSTERECTOMY  2008   ovaries also   CESAREAN SECTION  1994   COLONOSCOPY     COLONOSCOPY WITH PROPOFOL  N/A 02/12/2018   Procedure: COLONOSCOPY WITH PROPOFOL ;  Surgeon: Toledo, Alphonsus Jeans, MD;  Location: [facility];  Service: Gastroenterology;  Laterality: N/A;   ESOPHAGOGASTRODUODENOSCOPY (EGD) WITH PROPOFOL  N/A 02/12/2018   Procedure: ESOPHAGOGASTRODUODENOSCOPY (EGD) WITH PROPOFOL ;  Surgeon: Toledo, Alphonsus Jeans, MD;  Location: [facility];  Service: Gastroenterology;  Laterality: N/A;   EYE SURGERY Left 1973   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 She has never  used smokeless tobacco. She reports that she does not drink alcohol and does not use drugs.  Allergies  Allergen Reactions   Etodolac Other (See Comments)    Made her feel "loopy"   Hydrocodone-Acetaminophen  Nausea And Vomiting and Nausea Only   Orphenadrine Citrate Other (See Comments)    Made her feel "loopy". (Norflex) muscle relaxant   Penicillins Rash    Has patient had a PCN reaction causing  immediate rash, facial/tongue/throat swelling, SOB or lightheadedness with hypotension: Unknown Has patient had a PCN reaction causing severe rash involving mucus membranes or skin necrosis: Unknown Has patient had a PCN reaction that required hospitalization: No Has patient had a PCN reaction occurring within the last 10 years: No Childhood reaction If all of the above answers are "NO", then may proceed with Cephalosporin use.    Tramadol Other (See Comments)    Makes her feel very tired and loopy    Family History  Problem Relation Age of Onset   Melanoma Mother    Lung cancer Father    Ovarian cancer Maternal Grandmother    Breast cancer Neg Hx     Prior to Admission medications   Medication Sig Start Date End Date Taking? Authorizing Provider  amLODipine  (NORVASC ) 5 MG tablet Take 1 tablet (5 mg total) by mouth daily. 07/31/22 07/31/23  Suellyn Emory, MD  Calcium Carbonate-Vitamin D (CALCIUM 600+D) 600-400 MG-UNIT tablet Take 2 tablets by mouth daily.    [provider]  clonazePAM (KLONOPIN) 1 MG tablet Take 1 mg by mouth at bedtime. 07/17/17   [provider]  diphenhydrAMINE-APAP, sleep, (TYLENOL  PM EXTRA STRENGTH PO) Take 2 tablets by mouth at bedtime.     [provider]  Dulaglutide  (TRULICITY ) 0.75 MG/0.5ML SOPN Inject 0.75 mg into the skin once a week. 07/31/22   Suellyn Emory, MD  estradiol (ESTRACE) 2 MG tablet Take 2 mg by mouth daily. 11/04/17   [provider]  glimepiride  (AMARYL ) 1 MG tablet Take 1 mg by mouth daily. 08/08/17   [provider]  glimepiride  (AMARYL ) 2 MG tablet Take 1 tablet (2 mg total) by mouth every morning. 07/31/22 07/31/23  Suellyn Emory, MD  lisinopril  (PRINIVIL ,ZESTRIL ) 10 MG tablet Take 10 mg by mouth daily. 02/13/17   [provider]  metFORMIN (GLUCOPHAGE) 500 MG tablet Take 500-1,000 mg by mouth See admin instructions. Take 1 tablet (500 mg) in the morning & take 2 tablets (1000  mg) by mouth in the evening. 07/11/17   [provider]  Multiple Vitamin (MULTIVITAMIN WITH MINERALS) TABS tablet Take 1 tablet by mouth daily.    [provider]  pantoprazole  (PROTONIX ) 40 MG tablet Take 40 mg by mouth daily before breakfast. 01/28/18   [provider]  sertraline (ZOLOFT) 50 MG tablet Take 50 mg by mouth daily. 07/08/17   [provider]  simvastatin (ZOCOR) 20 MG tablet Take 20 mg by mouth at bedtime.  03/14/18 03/14/19  [provider]    Physical Exam: Vitals:   12/20/23 1030 12/20/23 1200 12/20/23 1230 12/20/23 1410  BP: (!) 160/105 (!) 157/91 (!) 165/87 (!) 127/94  Pulse: 89 86 98 85  Resp: 17 17 20 18   Temp:    97.7 F (36.5 C)  TempSrc:    Oral  SpO2: 99% 100% 99% 100%   Physical Exam Vitals and nursing note reviewed.  Constitutional:      General: She is not in acute distress.    Appearance: She is obese. She is  not toxic-appearing.  HENT:     Head: Normocephalic and atraumatic.     Mouth/Throat:     Mouth: Mucous membranes are moist.     Pharynx: Oropharynx is clear.  Eyes:     Extraocular Movements: Extraocular movements intact.     Pupils: Pupils are equal, round, and reactive to light.  Cardiovascular:     Rate and Rhythm: Normal rate and regular rhythm.     Heart sounds: No murmur heard.    No gallop.  Pulmonary:     Effort: Pulmonary effort is normal. No respiratory distress.     Breath sounds: Normal breath sounds. No wheezing, rhonchi or rales.  Abdominal:     General: Bowel sounds are normal.     Palpations: Abdomen is soft.  Musculoskeletal:     Right lower leg: No edema.     Left lower leg: No edema.  Skin:    General: Skin is warm and dry.  Neurological:     Mental Status: She is alert.     Comments:  Patient is alert and oriented x 3 No facial asymmetry Visual deficits noted, including right temporal hemianopia, Difficulty following directions, limiting ability to check  finger-to-nose, however normal left. Sensation grossly intact throughout 2-3/5 strength of right upper extremity.  Left upper and bilateral lower extremities 5 out of 5  Psychiatric:        Mood and Affect: Mood normal.        Behavior: Behavior normal.    Data Reviewed: CBC with WBC 7.6, hemoglobin of 13.4, platelets of 220 CMP with sodium of 135, potassium 3.5, bicarb 19, glucose 262, BUN 19, creatinine 0.61, AST 23, ALT 23, GFR 35 Troponin 6 Lipid panel with LDL of 80, triglycerides 154 Urinalysis with glucosuria, moderate hematuria, ketonuria, rare bacteria  EKG personally reviewed.  Sinus rhythm with rate of 87.  No acute ischemic changes.  MR BRAIN WO CONTRAST Result Date: 12/20/2023 CLINICAL DATA:  Provided history: Stroke, follow-up. Additional history provided: Vision changes, difficulty walking. EXAM: MRI HEAD WITHOUT CONTRAST TECHNIQUE: Multiplanar, multiecho pulse sequences of the brain and surrounding structures were obtained without intravenous contrast. COMPARISON:  Brain MRI 07/31/2022. FINDINGS: Brain: Mild generalized cerebral atrophy. Patchy acute cortical/subcortical infarcts within the left parietal and occipital lobes (MCA vascular territory). The largest individual infarct (located within the left parietal lobe) spans 3 cm. Subtle petechial hemorrhage associated with some of these infarcts. Additional punctate left MCA territory cortical acute infarct within the left insula. Chronic lacunar infarcts within the bilateral cerebral hemispheric white matter, some of which are new from the prior brain MRI of 07/31/2022. Prominent perivascular spaces within the basal ganglia. Chronic lacunar infarct within the left aspect of the pons, new from the prior MRI. Background moderate multifocal T2 FLAIR hyperintense signal abnormality within the cerebral white matter and pons, nonspecific but compatible with chronic small vessel ischemic disease. No evidence of an intracranial mass. No  extra-axial fluid collection. No midline shift. Vascular: Maintained flow voids within the proximal large arterial vessels. Skull and upper cervical spine: No focal worrisome marrow lesion. Incompletely assessed cervical spondylosis. Mild grade 1 anterolisthesis at C3-C4 and C4-C5. Sinuses/Orbits: No mass or acute finding within the imaged orbits. Severe right maxillary sinusitis (with near complete sinus opacification). Mild mucosal thickening within the left sphenoid sinus. Mild right ethmoid sinusitis. IMPRESSION: 1. Patchy acute cortical/subcortical infarcts within the left parietal and occipital lobes individually measuring up to 3 cm (MCA vascular territory). Subtle petechial hemorrhage associated with some  of these infarcts. No frank hemorrhagic conversion. 2. Additional punctate left MCA territory cortical acute infarct within the left insula. 3. Background parenchymal atrophy and chronic small vessel ischemic disease with chronic lacunar infarcts, as described. 4. Paranasal sinus disease as outlined (including severe right maxillary sinusitis). Electronically Signed   By: Bascom Lily D.O.   On: 12/20/2023 11:52   Results are pending, will review when available.  Assessment and Plan:  * Acute CVA (cerebrovascular accident) Haley Jones) Patient is presenting with multiple neurological deficits including right arm weakness/numbness, dysmetria, visual deficits.  MRI positive for multiple infarcts including larger 3 cm in the left parietal-occipital region, concerning for embolic etiology.  - Neurology consulted; appreciate their recommendations - CTA head/neck pending - Telemetry monitoring - Allow for permissive HTN (systolic < 220 and diastolic < 120)  - Echocardiogram  - ASA 325 mg once followed by/ 81 mg daily  - Start high intensity statin - PT/OT/SLP  Acute kidney injury superimposed on chronic kidney disease (HCC) Patient has a history of CKD stage IIIa with baseline creatinine ranging  between 1.2 and 1.4, now 1.61 concerning for mild AKI, especially given ketonuria on UA.  Likely due to poor p.o. 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  - S/p 1 L bolus - Push oral rehydration - Recheck BMP in the a.m.  Primary hypertension - Hold home antihypertensives to allow for permissive hypertension  DM type 2 with diabetic mixed hyperlipidemia (HCC) A1c checked 3 weeks ago, elevated 8.4%.  - Hold home regimen - SSI, moderate  B12 deficiency Severe B12 deficiency with last B12 of 175.  She was given a dose of IM at that time and started on sublingual.  Given chronicity, may benefit from checking intrinsic factor antibodies.  - Continue daily B12 supplementation  Advance Care Planning:   Code Status: Full Code Verified with RN and patient's sister at bedside.   Consults: Neurology  Family Communication: Patient's sister updated at bedside.   Severity of Illness: The appropriate patient status for this patient is OBSERVATION. Observation status is judged to be reasonable and necessary in order to provide the required intensity of service to ensure the patient's safety. The patient's presenting symptoms, physical exam findings, and initial radiographic and laboratory data in the context of their medical condition is felt to place them at decreased risk for further clinical deterioration. Furthermore, it is anticipated that the patient will be medically stable for discharge from the [facility] within 2 midnights of admission.   Author: Avi Body, MD 12/20/2023 2:30 PM  For on call review [email or web address].

## 2023-12-21 DIAGNOSIS — R414 Neurologic neglect syndrome: Secondary | ICD-10-CM | POA: Diagnosis present

## 2023-12-21 DIAGNOSIS — R471 Dysarthria and anarthria: Secondary | ICD-10-CM | POA: Diagnosis present

## 2023-12-21 DIAGNOSIS — N189 Chronic kidney disease, unspecified: Secondary | ICD-10-CM | POA: Diagnosis not present

## 2023-12-21 DIAGNOSIS — N179 Acute kidney failure, unspecified: Secondary | ICD-10-CM | POA: Diagnosis present

## 2023-12-21 DIAGNOSIS — F419 Anxiety disorder, unspecified: Secondary | ICD-10-CM | POA: Diagnosis present

## 2023-12-21 DIAGNOSIS — E1169 Type 2 diabetes mellitus with other specified complication: Secondary | ICD-10-CM | POA: Diagnosis present

## 2023-12-21 DIAGNOSIS — R809 Proteinuria, unspecified: Secondary | ICD-10-CM | POA: Diagnosis present

## 2023-12-21 DIAGNOSIS — N1831 Chronic kidney disease, stage 3a: Secondary | ICD-10-CM | POA: Diagnosis present

## 2023-12-21 DIAGNOSIS — Z794 Long term (current) use of insulin: Secondary | ICD-10-CM | POA: Diagnosis not present

## 2023-12-21 DIAGNOSIS — I639 Cerebral infarction, unspecified: Secondary | ICD-10-CM | POA: Diagnosis present

## 2023-12-21 DIAGNOSIS — F32A Depression, unspecified: Secondary | ICD-10-CM | POA: Diagnosis present

## 2023-12-21 DIAGNOSIS — R296 Repeated falls: Secondary | ICD-10-CM | POA: Diagnosis present

## 2023-12-21 DIAGNOSIS — E876 Hypokalemia: Secondary | ICD-10-CM | POA: Diagnosis present

## 2023-12-21 DIAGNOSIS — E538 Deficiency of other specified B group vitamins: Secondary | ICD-10-CM | POA: Diagnosis not present

## 2023-12-21 DIAGNOSIS — I63512 Cerebral infarction due to unspecified occlusion or stenosis of left middle cerebral artery: Secondary | ICD-10-CM | POA: Diagnosis present

## 2023-12-21 DIAGNOSIS — I129 Hypertensive chronic kidney disease with stage 1 through stage 4 chronic kidney disease, or unspecified chronic kidney disease: Secondary | ICD-10-CM | POA: Diagnosis present

## 2023-12-21 DIAGNOSIS — R824 Acetonuria: Secondary | ICD-10-CM | POA: Diagnosis present

## 2023-12-21 DIAGNOSIS — R233 Spontaneous ecchymoses: Secondary | ICD-10-CM | POA: Diagnosis present

## 2023-12-21 DIAGNOSIS — E1122 Type 2 diabetes mellitus with diabetic chronic kidney disease: Secondary | ICD-10-CM | POA: Diagnosis present

## 2023-12-21 DIAGNOSIS — E1165 Type 2 diabetes mellitus with hyperglycemia: Secondary | ICD-10-CM | POA: Diagnosis present

## 2023-12-21 DIAGNOSIS — J329 Chronic sinusitis, unspecified: Secondary | ICD-10-CM | POA: Diagnosis present

## 2023-12-21 DIAGNOSIS — R29710 NIHSS score 10: Secondary | ICD-10-CM | POA: Diagnosis present

## 2023-12-21 DIAGNOSIS — E782 Mixed hyperlipidemia: Secondary | ICD-10-CM | POA: Diagnosis present

## 2023-12-21 DIAGNOSIS — Z1152 Encounter for screening for COVID-19: Secondary | ICD-10-CM | POA: Diagnosis not present

## 2023-12-21 DIAGNOSIS — G8191 Hemiplegia, unspecified affecting right dominant side: Secondary | ICD-10-CM | POA: Diagnosis present

## 2023-12-21 DIAGNOSIS — K219 Gastro-esophageal reflux disease without esophagitis: Secondary | ICD-10-CM | POA: Diagnosis present

## 2023-12-21 DIAGNOSIS — R278 Other lack of coordination: Secondary | ICD-10-CM | POA: Diagnosis present

## 2023-12-21 LAB — CBC WITH DIFFERENTIAL/PLATELET
Abs Immature Granulocytes: 0.01 10*3/uL (ref 0.00–0.07)
Basophils Absolute: 0 10*3/uL (ref 0.0–0.1)
Basophils Relative: 0 %
Eosinophils Absolute: 0.1 10*3/uL (ref 0.0–0.5)
Eosinophils Relative: 3 %
HCT: 36 % (ref 36.0–46.0)
Hemoglobin: 12.8 g/dL (ref 12.0–15.0)
Immature Granulocytes: 0 %
Lymphocytes Relative: 33 %
Lymphs Abs: 1.6 10*3/uL (ref 0.7–4.0)
MCH: 31.8 pg (ref 26.0–34.0)
MCHC: 35.6 g/dL (ref 30.0–36.0)
MCV: 89.6 fL (ref 80.0–100.0)
Monocytes Absolute: 0.4 10*3/uL (ref 0.1–1.0)
Monocytes Relative: 9 %
Neutro Abs: 2.8 10*3/uL (ref 1.7–7.7)
Neutrophils Relative %: 55 %
Platelets: 183 10*3/uL (ref 150–400)
RBC: 4.02 MIL/uL (ref 3.87–5.11)
RDW: 11.6 % (ref 11.5–15.5)
WBC: 5 10*3/uL (ref 4.0–10.5)
nRBC: 0 % (ref 0.0–0.2)

## 2023-12-21 LAB — BASIC METABOLIC PANEL WITH GFR
Anion gap: 13 (ref 5–15)
BUN: 16 mg/dL (ref 8–23)
CO2: 21 mmol/L — ABNORMAL LOW (ref 22–32)
Calcium: 8.7 mg/dL — ABNORMAL LOW (ref 8.9–10.3)
Chloride: 104 mmol/L (ref 98–111)
Creatinine, Ser: 1.1 mg/dL — ABNORMAL HIGH (ref 0.44–1.00)
GFR, Estimated: 55 mL/min — ABNORMAL LOW (ref >60)
Glucose, Bld: 161 mg/dL — ABNORMAL HIGH (ref 70–99)
Potassium: 3.1 mmol/L — ABNORMAL LOW (ref 3.5–5.1)
Sodium: 138 mmol/L (ref 135–145)

## 2023-12-21 LAB — MAGNESIUM: Magnesium: 1.8 mg/dL (ref 1.7–2.4)

## 2023-12-21 MED ORDER — DIPHENHYDRAMINE HCL 25 MG PO CAPS
25.0000 mg | ORAL_CAPSULE | Freq: Every evening | ORAL | Status: DC | PRN
  Administered 2023-12-21 – 2023-12-23 (×3): 25 mg via ORAL
  Filled 2023-12-21 (×3): qty 1

## 2023-12-21 MED ORDER — POTASSIUM CHLORIDE 20 MEQ PO PACK
40.0000 meq | PACK | Freq: Once | ORAL | Status: AC
  Administered 2023-12-21: 40 meq via ORAL
  Filled 2023-12-21: qty 2

## 2023-12-21 MED ORDER — ACETAMINOPHEN 500 MG PO TABS
500.0000 mg | ORAL_TABLET | Freq: Every evening | ORAL | Status: DC | PRN
  Administered 2023-12-22 – 2023-12-25 (×4): 500 mg via ORAL
  Filled 2023-12-21 (×4): qty 1

## 2023-12-21 MED ORDER — DIPHENHYDRAMINE-APAP (SLEEP) 25-500 MG PO TABS: 1.0000 | ORAL_TABLET | Freq: Every evening | ORAL | Status: DC | PRN

## 2023-12-21 NOTE — Evaluation (Addendum)
 Physical Therapy Evaluation Patient Details Name: Haley Jones MRN: [id number] DOB: [birth year] Today's Date: 12/21/2023  History of Present Illness  Pt is a 68 y.o. 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 She did report she has several family members that live around her, or live in town.  Upon assessment the pt demonstrated R inattention throughout session. Pt unable to extinguish light touch on RLE,  when MMT position achieved, strength in knee 5/5, decreased hip flexion strength 4-/5, unable to MMT DF/PF truly due to difficulty understanding positioning needed, but noted for some active DF/PF. Supine to sit with CGA, sit <> Stand with RW and minA as well. RUE/RLE coordination and inattention deficits limitng factors. unable to maintain RUE on RW, often hit R toes on RW leg, minA for intermittent steadying, but ambulated ~61ft with close chair follow.  Overall the patient demonstrated deficits (see "PT Problem List") that impede the patient's functional abilities, safety, and mobility and would benefit from skilled PT intervention. Recommendation is for intensive skilled PT intervention (<3hrs) to maximize outcomes.       If plan is discharge home, recommend the following: A lot of help with walking and/or transfers;A little help with bathing/dressing/bathroom;Assistance with cooking/housework;Supervision due to cognitive status;Assist for transportation;Help with stairs or ramp for entrance;Direct supervision/assist for medications management   Can travel by private vehicle        Equipment Recommendations Other (comment)  (TBD)  Recommendations for Other Services       Functional Status Assessment Patient has had a recent decline in their functional status and demonstrates the ability to make significant improvements in function in a reasonable and predictable amount of time.     Precautions / Restrictions Precautions Precautions: Fall Recall of Precautions/Restrictions: Impaired Restrictions Weight Bearing Restrictions Per Provider Order: No      Mobility  Bed Mobility Overal bed mobility: Needs Assistance Bed Mobility: Supine to Sit     Supine to sit: Contact guard, Used rails, HOB elevated     General bed mobility comments: able to transfer, did not actively use RUE    Transfers Overall transfer level: Needs assistance Equipment used: Rolling walker (2 wheels) Transfers: Sit to/from Stand Sit to Stand: Min assist           General transfer comment: verbal/tactile cues to attend to R side.    Ambulation/Gait Ambulation/Gait assistance: Min assist Gait Distance (Feet): 50 Feet Assistive device: Rolling walker (2 wheels)   Gait velocity: decreased     General Gait Details: RUE/RLE coordination and inattention deficits limitng factors. unable to maintain RUE on RW, often hit R toes on RW leg, minA for intermittent steadying  Stairs            Wheelchair Mobility     Tilt Bed    Modified Rankin (Stroke Patients Only)       Balance Overall balance assessment: Needs assistance Sitting-balance support: Feet supported Sitting balance-Leahy Scale: Good     Standing balance support: Bilateral upper extremity supported, During functional activity, Single extremity supported Standing balance-Leahy Scale: Fair  Pertinent Vitals/Pain Pain Assessment Pain Assessment: Faces Pain Location: unable to localize Pain Descriptors / Indicators: Grimacing, Guarding, Moaning Pain Intervention(s): Limited activity within patient's  tolerance, Monitored during session, Repositioned    Home Living Family/patient expects to be discharged to:: Private residence Living Arrangements: Children Available Help at Discharge: Family;Other (Comment) (has a sitter couple times a week saturday, PRN, has several family members around sister, and brother lives nearby as well) Type of Home: Mobile home Home Access: Ramped entrance       Home Layout: One level Home Equipment: Grab bars - tub/shower;Shower seat;Rollator (4 wheels) Additional Comments: reported 5 falls in the last month. daughter also have a school she goes to mon-fri, via transport    Prior Function Prior Level of Function : Independent/Modified Independent;Driving;History of Falls (last six months)               ADLs Comments: pt reports she is caregiver for adult daughter with CP at home and daughter requires assist for everything (aide comes 1x/ wk to assist with bathing and otherwise there several days a week for other assist for daughter). Multiple recent falls.     Extremity/Trunk Assessment   Upper Extremity Assessment Upper Extremity Assessment: Defer to OT evaluation    Lower Extremity Assessment Lower Extremity Assessment: RLE deficits/detail;LLE deficits/detail RLE Deficits / Details: pt unable to extinguish light touch on RLE, inattention noted throughout. when MMT position achieved, strength in knee 5/5, decreased hip flexion strength 4-/5, unable to MMT DF/PF truly due to difficulty following commands, but noted for some active DF/PF LLE Deficits / Details: WFLs       Communication        Cognition Arousal: Alert Behavior During Therapy: WFL for tasks assessed/performed                           PT - Cognition Comments: pt oriented to self, oriented to some situation "someone told me i had strokes" able to provide PLOF Following commands: Intact       Cueing Cueing Techniques: Verbal cues, Gestural cues, Tactile cues      General Comments      Exercises     Assessment/Plan    PT Assessment Patient needs continued PT services  PT Problem List Decreased strength;Decreased range of motion;Decreased activity tolerance;Decreased balance;Decreased mobility;Decreased knowledge of precautions;Decreased knowledge of use of DME;Decreased cognition;Decreased safety awareness       PT Treatment Interventions Balance training;Gait training;Neuromuscular re-education;DME instruction;Stair training;Functional mobility training;Patient/family education;Therapeutic activities;Therapeutic exercise    PT Goals (Current goals can be found in the Care Plan section)  Acute Rehab PT Goals Patient Stated Goal: to feel better PT Goal Formulation: With patient Time For Goal Achievement: 01/04/24 Potential to Achieve Goals: Fair    Frequency Min 3X/week     Co-evaluation PT/OT/SLP Co-Evaluation/Treatment: Yes Reason for Co-Treatment: Complexity of the patient's impairments (multi-system involvement);For patient/therapist safety;To address functional/ADL transfers PT goals addressed during session: Mobility/safety with mobility;Balance;Proper use of DME OT goals addressed during session: Proper use of Adaptive equipment and DME;ADL's and self-care       AM-PAC PT "6 Clicks" Mobility  Outcome Measure Help needed turning from your back to your side while in a flat bed without using bedrails?: A Little Help needed moving from lying on your back to sitting on the side of a flat bed without using bedrails?: A Little Help needed moving to and from a bed to a chair (including a  wheelchair)?: A Little Help needed standing up from a chair using your arms (e.g., wheelchair or bedside chair)?: A Little Help needed to walk in hospital room?: A Lot Help needed climbing 3-5 steps with a railing? : Total 6 Click Score: 15    End of Session Equipment Utilized During Treatment: Gait belt Activity Tolerance: Patient tolerated  treatment well Patient left: in chair;with call bell/phone within reach;with chair alarm set Nurse Communication: Mobility status PT Visit Diagnosis: Other abnormalities of gait and mobility (R26.89);Difficulty in walking, not elsewhere classified (R26.2);Muscle weakness (generalized) (M62.81)    Time: 1610-9604 PT Time Calculation (min) (ACUTE ONLY): 29 min   Charges:   PT Evaluation $PT Eval Low Complexity: 1 Low PT Treatments $Therapeutic Activity: 8-22 mins PT General Charges $$ ACUTE PT VISIT: 1 Visit         Darien Eden PT, DPT 12:56 PM,12/21/23

## 2023-12-21 NOTE — Evaluation (Signed)
 Occupational Therapy Evaluation Patient Details Name: Haley Jones MRN: [id number] DOB: 11-Mar-1956 Today's Date: 12/21/2023   History of Present Illness   Pt is a 68 y.o. 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     Clinical Impressions Pt seen for OT evaluation and co-tx with PT. At baseline, pt is independent in all aspects, caregiver for her adult daughter with CP requiring assist with "everything." Pt notes that family and a PCA also assist in dtr's care. Pt presents with R deficits in inattention, inability to extinguish light touch on RUE/RLE, impaired R shoulder ROM and strength with MMT, otherwise grossly WFL, impaired FMC, and questionable R side visual deficits in B eyes. 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 Difficulty with confrontation noted. Pt grossly aware of situation but difficulty identifying what issues she's having, identifying where she has pain, and requires intermittent multimodal cues for ADL and mobility 2/2 inattention. Pt requires MIN A for LB ADL tasks and MIN A for ADL transfers. Pt very motivated to improve to get home to her daughter.  Will benefit from high intensity skilled OT services (>3hr/day) to optimize return to PLOF.    If plan is discharge home, recommend the following:   A little help with walking and/or transfers;A little help with bathing/dressing/bathroom;Assistance with cooking/housework;Direct supervision/assist for medications management;Assist for transportation;Help with stairs or ramp for entrance     Functional Status Assessment   Patient has had a recent decline in their functional status and demonstrates the ability to make significant improvements in function in a reasonable and predictable  amount of time.     Equipment Recommendations   Other (comment) (defer)      Precautions/Restrictions   Precautions Precautions: Fall Recall of Precautions/Restrictions: Impaired Restrictions Weight Bearing Restrictions Per Provider Order: No     Mobility Bed Mobility Overal bed mobility: Needs Assistance Bed Mobility: Supine to Sit     Supine to sit: Contact guard, Used rails, HOB elevated     General bed mobility comments: able to transfer, did not actively use RUE    Transfers Overall transfer level: Needs assistance Equipment used: Rolling walker (2 wheels) Transfers: Sit to/from Stand Sit to Stand: Min assist           General transfer comment: verbal/tactile cues to attend to R side.      Balance Overall balance assessment: Needs assistance Sitting-balance support: Feet supported Sitting balance-Leahy Scale: Good     Standing balance support: Bilateral upper extremity supported, During functional activity, Single extremity supported Standing balance-Leahy Scale: Fair         ADL either performed or assessed with clinical judgement   ADL Overall ADL's : Needs assistance/impaired Eating/Feeding: Bed level;Independent       Lower Body Dressing: Sitting/lateral leans;Minimal assistance;Sit to/from stand   Toilet Transfer: Minimal assistance;Rolling walker (2 wheels);BSC/3in1 Toilet Transfer Details (indicate cue type and reason): simulated Toileting- Clothing Manipulation and Hygiene: Sit to/from stand;Minimal assistance               Vision Baseline Vision/History:  (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) with slight lateral deviation of L eye gaze) Ability to See in Adequate Light: 0 Adequate Patient Visual Report: Peripheral vision  impairment (R side) Vision Assessment?: Yes Eye Alignment: Impaired (comment) (L eye slightly laterally deviated from center (baseline)) Ocular Range of Motion: Within  Functional Limits Tracking/Visual Pursuits: Able to track stimulus in all quads without difficulty Convergence: Impaired - to be further tested in functional context Visual Fields: Right visual field deficit;Impaired-to be further tested in functional context            Pertinent Vitals/Pain Pain Assessment Pain Assessment: Faces Faces Pain Scale: Hurts a little bit Pain Location: unable to localize Pain Descriptors / Indicators: Grimacing, Guarding, Moaning Pain Intervention(s): Limited activity within patient's tolerance, Monitored during session, Repositioned     Extremity/Trunk Assessment Upper Extremity Assessment Upper Extremity Assessment: Left hand dominant;RUE deficits/detail RUE Deficits / Details: Difficulty with FMC, extinction on R side, motor overflow to RLE while completing MMT with RUE, and inattention on R side; hx shoulder pain L>R at baseline RUE: Shoulder pain with ROM RUE Coordination: decreased fine motor   Lower Extremity Assessment Lower Extremity Assessment: Defer to PT evaluation RLE Deficits / Details: pt unable to extinguish light touch on RLE, inattention noted throughout. when MMT position achieved, strength in knee 5/5, decreased hip flexion strength 4-/5, unable to MMT DF/PF truly due to difficulty following commands, but noted for some active DF/PF LLE Deficits / Details: WFLs       Communication     Cognition Arousal: Alert Behavior During Therapy: WFL for tasks assessed/performed Cognition: Cognition impaired   Orientation impairments: Person, Place, Situation Awareness: Intellectual awareness impaired, Online awareness impaired     Executive functioning impairment (select all impairments): Sequencing, Problem solving, Reasoning            Home Living Family/patient expects to be discharged to:: Private residence Living Arrangements: Children Available Help at Discharge: Family;Other (Comment) (other family members in town for  support; Comptroller in the home during the week(for Dtr?)) Type of Home: Mobile home Home Access: Ramped entrance     Home Layout: One level     Bathroom Shower/Tub: Walk-in shower         Home Equipment: Grab bars - tub/shower;Shower seat;Rollator (4 wheels)   Additional Comments: reported 5 falls in the last month. daughter also have a school she goes to mon-fri, via transport  Lives With: Daughter    Prior Functioning/Environment Prior Level of Function : Independent/Modified Independent;Driving;History of Falls (last six months)               ADLs Comments: pt reports she is caregiver for adult daughter with CP at home and daughter requires assist for everything (aide comes 1x/ wk to assist with bathing and otherwise there several days a week for other assist for daughter). Multiple recent falls.    OT Problem List: Decreased coordination;Impaired sensation;Impaired vision/perception;Decreased knowledge of precautions;Impaired UE functional use;Decreased knowledge of use of DME or AE;Impaired balance (sitting and/or standing);Decreased safety awareness;Decreased strength   OT Treatment/Interventions: Self-care/ADL training;Therapeutic exercise;Therapeutic activities;Neuromuscular education;DME and/or AE instruction;Patient/family education;Balance training;Cognitive remediation/compensation;Visual/perceptual remediation/compensation      OT Goals(Current goals can be found in the care plan section)   Acute Rehab OT Goals Patient Stated Goal: get better and take care of my daughter OT Goal Formulation: With patient Time For Goal Achievement: 01/04/24 Potential to Achieve Goals: Good ADL Goals Pt Will Perform Lower Body Dressing: sit to/from stand;with supervision Pt Will Transfer to Toilet: ambulating;with supervision;bedside commode;regular height toilet (LRAD) Pt Will Perform Toileting - Clothing Manipulation and hygiene: with modified independence;sitting/lateral  leans;sit to/from stand Additional ADL  Goal #1: Pt will complete ADL tasks requiring MIN VC for R side attention/involvement, sequencing, and safety, 10/10 opportunities.   OT Frequency:  Min 3X/week    Co-evaluation PT/OT/SLP Co-Evaluation/Treatment: Yes Reason for Co-Treatment: Complexity of the patient's impairments (multi-system involvement);For patient/therapist safety;To address functional/ADL transfers PT goals addressed during session: Mobility/safety with mobility;Balance;Proper use of DME OT goals addressed during session: Proper use of Adaptive equipment and DME;ADL's and self-care      AM-PAC OT "6 Clicks" Daily Activity     Outcome Measure Help from another person eating meals?: None Help from another person taking care of personal grooming?: A Little Help from another person toileting, which includes using toliet, bedpan, or urinal?: A Little Help from another person bathing (including washing, rinsing, drying)?: A Little Help from another person to put on and taking off regular upper body clothing?: A Little Help from another person to put on and taking off regular lower body clothing?: A Little 6 Click Score: 19   End of Session Equipment Utilized During Treatment: Gait belt;Rolling walker (2 wheels)  Activity Tolerance: Patient tolerated treatment well Patient left: in chair;with call bell/phone within reach;with chair alarm set  OT Visit Diagnosis: Other symptoms and signs involving cognitive function;Hemiplegia and hemiparesis;Low vision, both eyes (H54.2);History of falling (Z91.81) Hemiplegia - Right/Left: Right Hemiplegia - dominant/non-dominant: Non-Dominant Hemiplegia - caused by: Cerebral infarction                Time: 0865-7846 OT Time Calculation (min): 29 min Charges:  OT General Charges $OT Visit: 1 Visit OT Evaluation $OT Eval Moderate Complexity: 1 Mod  Berenda Breaker., MPH, MS, OTR/L ascom [phone] 12/21/23, 3:20 PM

## 2023-12-21 NOTE — Progress Notes (Addendum)
 Progress Note    Haley Jones  QIO:[id number] DOB: [birth year]  DOA: 12/20/2023 PCP: Sari Cunning, MD      Brief Narrative:    Medical records reviewed and are as summarized below:  Haley Jones is a 68 y.o. female with medical history significant of Hypertension, B12 deficiency, type 2 diabetes, hyperlipidemia, CKD stage IIIa, who presented to the hospital with right-sided weakness.  She also reported a 2 to 3-week history of dizziness and she has been falling more often.  She reported this to her PCP and she was started on meclizine.  Her sister came to visit her a day prior to admission and she noticed that she was not moving her right hand well.  Patient could not tell exactly when the symptoms started.  Patient is a primary caregiver for her disabled 66 year old daughter at home.   She was admitted to the hospital for left MCA stroke.     Assessment/Plan:   Principal Problem:   Acute CVA (cerebrovascular accident) [facility]) Active Problems:   Acute kidney injury superimposed on chronic kidney disease (HCC)   B12 deficiency   DM type 2 with diabetic mixed hyperlipidemia (HCC)   Primary hypertension    Acute left MCA stroke: Neurologist recommended dual antiplatelet therapy with low-dose aspirin  and Plavix for 90 days followed by Plavix monotherapy.  Continue Crestor . 2D echo showed EF estimated at 60 to 65%, grade 1 diastolic dysfunction.  No interatrial shunt. PT, OT and ST evaluation   AKI on CKD stage IIIa: Improved.  Creatinine down from 1.61-1.10.   Hypokalemia: Replete potassium and monitor levels.   Hypertension: Allow permissive hypertension for now.   Type II DM with hyperglycemia: NovoLog  as needed for hyperglycemia.  Metformin, glimepiride , glipizide and Jardiance have been held Hemoglobin A1c was 8.4 on 11/26/2023   Hyperlipidemia: Triglycerides 154, total cholesterol 170, HDL 59 LDL 80.  Continue Crestor    Vitamin B12  deficiency: She said she prefers to take the injections instead of pills.  Patient said she got a vitamin B12 shot at her doctor's office.  She has been advised to follow-up with PCP for regular vitamin B12 injections. Vitamin B12 was 175 on 11/26/2023    Comorbidities include chronic back pain, anxiety, depression, chronic sinusitis   Plan of care was discussed with Haley Jones, sister, at the bedside.  All her questions were answered.  All test results were discussed.  Diet Order             Diet Carb Modified Fluid consistency: Thin  Diet effective now                            Consultants: Neurologist  Procedures: None    Medications:    aspirin  EC  81 mg Oral Daily   clonazePAM   1 mg Oral QHS   enoxaparin  (LOVENOX ) injection  40 mg Subcutaneous Q24H   feeding supplement  237 mL Oral BID BM   pantoprazole   40 mg Oral QAC breakfast   pneumococcal 20-valent conjugate vaccine  0.5 mL Intramuscular Tomorrow-1000   rosuvastatin   20 mg Oral Daily   venlafaxine  XR  37.5 mg Oral Daily   Continuous Infusions:   Anti-infectives (From admission, onward)    None              Family Communication/Anticipated D/C date and plan/Code Status   DVT prophylaxis: enoxaparin  (LOVENOX ) injection 40 mg Start: 12/20/23 2200  Code Status: Full Code  Family Communication: Plan discussed with Haley Jones, sister at the bedside Disposition Plan: Plan to discharge home vs SNF   Status is: Inpatient Remains inpatient appropriate because: Acute stroke         Subjective:   Interval events noted.  She feels better today.  She still has some weakness in the right side.  Haley Jones, sister, was at the bedside.  Objective:    Vitals:   12/20/23 2012 12/21/23 0354 12/21/23 0731 12/21/23 1154  BP: (!) 157/81 129/81 130/60 (!) 152/87  Pulse: (!) 101 85 82 88  Resp: 16 16 16 14   Temp: 98.2 F (36.8 C) (!) 97.5 F (36.4 C) 98.2 F (36.8 C) 98.6 F (37 C)   TempSrc:   Oral Oral  SpO2: 100% 99% 98% 99%   No data found.   Intake/Output Summary (Last 24 hours) at 12/21/2023 1202 Last data filed at 12/21/2023 0400 Gross per 24 hour  Intake --  Output 1500 ml  Net -1500 ml   There were no vitals filed for this visit.  Exam:  GEN: NAD SKIN: Warm and dry EYES: No pallor or icterus, PERRLA, right visual field cut ENT: MMM, hoarse voice CV: RRR PULM: CTA B ABD: soft, ND, NT, +BS CNS: AAO x 3, mild right hemiparesis (power is 4/5 in both right upper and lower extremities) EXT: No edema or tenderness        Data Reviewed:   I have personally reviewed following labs and imaging studies:  Labs: Labs show the following:   Basic Metabolic Panel: Recent Labs  Lab 12/20/23 0909 12/21/23 0503 12/21/23 0505  NA 135  --  138  K 3.5  --  3.1*  CL 102  --  104  CO2 19*  --  21*  GLUCOSE 262*  --  161*  BUN 19  --  16  CREATININE 1.61*  --  1.10*  CALCIUM  9.2  --  8.7*  MG  --  1.8  --    GFR CrCl cannot be calculated (Unknown ideal weight.). Liver Function Tests: Recent Labs  Lab 12/20/23 0909  AST 23  ALT 23  ALKPHOS 77  BILITOT 1.2  PROT 7.3  ALBUMIN 3.7   No results for input(s): "LIPASE", "AMYLASE" in the last 168 hours. No results for input(s): "AMMONIA" in the last 168 hours. Coagulation profile No results for input(s): "INR", "PROTIME" in the last 168 hours.  CBC: Recent Labs  Lab 12/20/23 0909 12/21/23 0505  WBC 7.6 5.0  NEUTROABS 5.7 2.8  HGB 13.4 12.8  HCT 38.2 36.0  MCV 91.0 89.6  PLT 220 183   Cardiac Enzymes: No results for input(s): "CKTOTAL", "CKMB", "CKMBINDEX", "TROPONINI" in the last 168 hours. BNP (last 3 results) No results for input(s): "PROBNP" in the last 8760 hours. CBG: Recent Labs  Lab 12/20/23 0911 12/20/23 1627  GLUCAP 243* 116*   D-Dimer: No results for input(s): "DDIMER" in the last 72 hours. Hgb A1c: No results for input(s): "HGBA1C" in the last 72 hours. Lipid  Profile: Recent Labs    12/20/23 0909  CHOL 170  HDL 59  LDLCALC 80  TRIG 154*  CHOLHDL 2.9   Thyroid function studies: No results for input(s): "TSH", "T4TOTAL", "T3FREE", "THYROIDAB" in the last 72 hours.  Invalid input(s): "FREET3" Anemia work up: No results for input(s): "VITAMINB12", "FOLATE", "FERRITIN", "TIBC", "IRON", "RETICCTPCT" in the last 72 hours. Sepsis Labs: Recent Labs  Lab 12/20/23 0909 12/21/23 0505  WBC 7.6  5.0    Microbiology No results found for this or any previous visit (from the past 240 hours).  Procedures and diagnostic studies:  ECHOCARDIOGRAM COMPLETE Result Date: 12/20/2023    ECHOCARDIOGRAM REPORT   Patient Name:   Bethzaida KISTLER Letarte Date of Exam: 12/20/2023 Medical Rec #:  [id number]             Height:       57.0 in Accession #:    [id number]            Weight:       155.0 lb Date of Birth:  11/26/1955             BSA:          1.614 m Patient Age:    68 years              BP:           119/98 mmHg Patient Gender: F                     HR:           82 bpm. Exam Location:  [facility] Procedure: 2D Echo (Both Spectral and Color Flow Doppler were utilized during            procedure). Indications:     stroke  History:         Patient has no prior history of Echocardiogram examinations.                  Risk Factors:Diabetes and Hypertension.  Sonographer:     Dione Franks RDCS Referring Phys:  [id number] Avi Body Diagnosing Phys: Belva Boyden MD IMPRESSIONS  1. Left ventricular ejection fraction, by estimation, is 60 to 65%. The left ventricle has normal function. The left ventricle has no regional wall motion abnormalities. Left ventricular diastolic parameters are consistent with Grade I diastolic dysfunction (impaired relaxation).  2. Right ventricular systolic function is normal. The right ventricular size is normal.  3. The mitral valve is normal in structure. No evidence of mitral valve regurgitation. No evidence of mitral stenosis.  4. The aortic  valve is normal in structure. Aortic valve regurgitation is not visualized. Aortic valve sclerosis is present, with no evidence of aortic valve stenosis.  5. The inferior vena cava is normal in size with greater than 50% respiratory variability, suggesting right atrial pressure of 3 mmHg. FINDINGS  Left Ventricle: Left ventricular ejection fraction, by estimation, is 60 to 65%. The left ventricle has normal function. The left ventricle has no regional wall motion abnormalities. Strain was performed and the global longitudinal strain is indeterminate. The left ventricular internal cavity size was normal in size. There is no left ventricular hypertrophy. Left ventricular diastolic parameters are consistent with Grade I diastolic dysfunction (impaired relaxation). Right Ventricle: The right ventricular size is normal. No increase in right ventricular wall thickness. Right ventricular systolic function is normal. Left Atrium: Left atrial size was normal in size. Right Atrium: Right atrial size was normal in size. Pericardium: There is no evidence of pericardial effusion. Mitral Valve: The mitral valve is normal in structure. No evidence of mitral valve regurgitation. No evidence of mitral valve stenosis. Tricuspid Valve: The tricuspid valve is normal in structure. Tricuspid valve regurgitation is not demonstrated. No evidence of tricuspid stenosis. Aortic Valve: The aortic valve is normal in structure. Aortic valve regurgitation is not visualized. Aortic valve sclerosis is present, with no evidence of aortic valve stenosis.  Pulmonic Valve: The pulmonic valve was normal in structure. Pulmonic valve regurgitation is not visualized. No evidence of pulmonic stenosis. Aorta: The aortic root is normal in size and structure. Venous: The inferior vena cava is normal in size with greater than 50% respiratory variability, suggesting right atrial pressure of 3 mmHg. IAS/Shunts: No atrial level shunt detected by color flow Doppler.  Additional Comments: 3D was performed not requiring image post processing on an independent workstation and was indeterminate.  LEFT VENTRICLE PLAX 2D LVIDd:         3.90 cm   Diastology LVIDs:         2.50 cm   LV e' medial:    9.46 cm/s LV PW:         1.10 cm   LV E/e' medial:  10.1 LV IVS:        1.00 cm   LV e' lateral:   10.90 cm/s LVOT diam:     1.70 cm   LV E/e' lateral: 8.8 LV SV:         49 LV SV Index:   30 LVOT Area:     2.27 cm  RIGHT VENTRICLE             IVC RV Basal diam:  2.60 cm     IVC diam: 1.70 cm RV S prime:     21.60 cm/s TAPSE (M-mode): 1.7 cm LEFT ATRIUM           Index        RIGHT ATRIUM          Index LA diam:      3.00 cm 1.86 cm/m   RA Area:     9.65 cm LA Vol (A4C): 31.8 ml 19.70 ml/m  RA Volume:   19.10 ml 11.83 ml/m  AORTIC VALVE LVOT Vmax:   119.00 cm/s LVOT Vmean:  79.900 cm/s LVOT VTI:    0.215 m  AORTA Ao Root diam: 3.00 cm Ao Asc diam:  3.20 cm MITRAL VALVE MV Area (PHT): 3.60 cm    SHUNTS MV Decel Time: 211 msec    Systemic VTI:  0.22 m MV E velocity: 95.60 cm/s  Systemic Diam: 1.70 cm MV A velocity: 97.10 cm/s MV E/A ratio:  0.98 Belva Boyden MD Electronically signed by Belva Boyden MD Signature Date/Time: 12/20/2023/6:28:29 PM    Final    CT ANGIO HEAD NECK W WO CM Result Date: 12/20/2023 CLINICAL DATA:  Neuro deficit, acute, stroke suspected. Worsening confusion. Falling and dizziness. EXAM: CT ANGIOGRAPHY HEAD AND NECK WITH AND WITHOUT CONTRAST TECHNIQUE: Multidetector CT imaging of the head and neck was performed using the standard protocol during bolus administration of intravenous contrast. Multiplanar CT image reconstructions and MIPs were obtained to evaluate the vascular anatomy. Carotid stenosis measurements (when applicable) are obtained utilizing NASCET criteria, using the distal internal carotid diameter as the denominator. RADIATION DOSE REDUCTION: This exam was performed according to the departmental dose-optimization program which includes automated  exposure control, adjustment of the mA and/or kV according to patient size and/or use of iterative reconstruction technique. CONTRAST:  60mL OMNIPAQUE  IOHEXOL  350 MG/ML SOLN COMPARISON:  MRI earlier same day FINDINGS: CT HEAD FINDINGS Brain: No focal abnormality seen affecting the brainstem or cerebellum. Cerebral hemispheres show moderate chronic small-vessel ischemic changes of the white matter. Acute infarction evident affecting several adjacent gyri in the left parietal region. Mild swelling but no mass effect or hemorrhage. No mass, hydrocephalus or extra-axial collection. Vascular: There is atherosclerotic calcification  of the major vessels at the base of the brain. Skull: Negative Sinuses/Orbits: Opacified right maxillary sinus.  Orbits negative. Other: None Review of the MIP images confirms the above findings CTA NECK FINDINGS Aortic arch: Aortic atherosclerosis. Branching pattern is normal without origin stenosis. Right carotid system: Common carotid artery widely patent to the bifurcation. Mild plaque at the bifurcation but no stenosis. Cervical ICA widely patent. Left carotid system: Common carotid artery shows soft plaque in its midportion with stenosis of 25%. Beyond that the vessel is widely patent to the bifurcation. Normal bifurcation. Cervical ICA is normal. Vertebral arteries: No proximal subclavian stenosis. Both vertebral artery origins are widely patent. Vertebral arteries are normal through the cervical region to the foramen magnum. Skeleton: Ordinary cervical spondylosis. Other neck: No mass or lymphadenopathy. Upper chest: Lung apices are clear. Review of the MIP images confirms the above findings CTA HEAD FINDINGS Anterior circulation: Both internal carotid arteries are patent through the skull base and siphon regions. Patent posterior communicating artery on the right. The right anterior and middle cerebral vessels are patent. There is severe stenosis or nonocclusive thrombus at the left  MCA bifurcation affecting the inferior division. Nonocclusive embolus is visible within a left parietal branch as well. This would be M4. Posterior circulation: Both vertebral arteries are patent through the foramen magnum to the basilar artery. No basilar stenosis. Posterior circulation branch vessels are patent. Some atherosclerotic irregularity of the more distal PCA branches. Venous sinuses: Patent and normal. Anatomic variants: None significant. Review of the MIP images confirms the above findings IMPRESSION: 1. Acute infarction affecting several adjacent gyri in the left parietal region. Mild swelling but no mass effect or hemorrhage. 2. Severe stenosis or nonocclusive thrombus at the left MCA bifurcation affecting the inferior division. Nonocclusive embolus is visible within a left parietal branch as well. This would be M4. 3. Aortic atherosclerosis. 4. Soft plaque in the midportion of the left common carotid artery with stenosis of 25%. 5. Mild atherosclerotic change at the right carotid bifurcation but no stenosis. Aortic Atherosclerosis (ICD10-I70.0). Electronically Signed   By: Bettylou Brunner M.D.   On: 12/20/2023 14:52   MR BRAIN WO CONTRAST Result Date: 12/20/2023 CLINICAL DATA:  Provided history: Stroke, follow-up. Additional history provided: Vision changes, difficulty walking. EXAM: MRI HEAD WITHOUT CONTRAST TECHNIQUE: Multiplanar, multiecho pulse sequences of the brain and surrounding structures were obtained without intravenous contrast. COMPARISON:  Brain MRI 07/31/2022. FINDINGS: Brain: Mild generalized cerebral atrophy. Patchy acute cortical/subcortical infarcts within the left parietal and occipital lobes (MCA vascular territory). The largest individual infarct (located within the left parietal lobe) spans 3 cm. Subtle petechial hemorrhage associated with some of these infarcts. Additional punctate left MCA territory cortical acute infarct within the left insula. Chronic lacunar infarcts within  the bilateral cerebral hemispheric white matter, some of which are new from the prior brain MRI of 07/31/2022. Prominent perivascular spaces within the basal ganglia. Chronic lacunar infarct within the left aspect of the pons, new from the prior MRI. Background moderate multifocal T2 FLAIR hyperintense signal abnormality within the cerebral white matter and pons, nonspecific but compatible with chronic small vessel ischemic disease. No evidence of an intracranial mass. No extra-axial fluid collection. No midline shift. Vascular: Maintained flow voids within the proximal large arterial vessels. Skull and upper cervical spine: No focal worrisome marrow lesion. Incompletely assessed cervical spondylosis. Mild grade 1 anterolisthesis at C3-C4 and C4-C5. Sinuses/Orbits: No mass or acute finding within the imaged orbits. Severe right maxillary sinusitis (with near complete  sinus opacification). Mild mucosal thickening within the left sphenoid sinus. Mild right ethmoid sinusitis. IMPRESSION: 1. Patchy acute cortical/subcortical infarcts within the left parietal and occipital lobes individually measuring up to 3 cm (MCA vascular territory). Subtle petechial hemorrhage associated with some of these infarcts. No frank hemorrhagic conversion. 2. Additional punctate left MCA territory cortical acute infarct within the left insula. 3. Background parenchymal atrophy and chronic small vessel ischemic disease with chronic lacunar infarcts, as described. 4. Paranasal sinus disease as outlined (including severe right maxillary sinusitis). Electronically Signed   By: Bascom Lily D.O.   On: 12/20/2023 11:52               LOS: 0 days   Shann Lewellyn  [facility]   Pager on [email or web address]. If 7PM-7AM, please contact night-coverage at [email or web address]     12/21/2023, 12:02 PM

## 2023-12-21 NOTE — Consult Note (Addendum)
 NEUROLOGY CONSULT NOTE   Date of service: Dec 21, 2023 Patient Name: Haley Jones MRN:  [id number] DOB:  1956-01-06 Chief Complaint: acute ischemic stroke Requesting Provider: Sheril Dines, MD  History of Present Illness  Haley Jones is a 68 y.o. female with hx of hypertension, B12 deficiency, type 2 diabetes, hyperlipidemia, CKD stage III who presented to the ED due to right-sided weakness.  Patient reports that she has been feeling dizzy lately but is not sure how long.  She did tell the admitting hospitalist that this had been lasting at least 2 to 3 weeks.  She is by her PCP for this and was started on meclizine but this did not help her symptoms.  During this time she has been falling more often than usual up to 1 time per day.  Her sister came to visit yesterday and noticed that she was not using her right hand properly when she was trying to eat.  They and did not seem to do what she wanted and she seemed weaker on that right side in general.  Patient also was feeling numbness in that area.  She is not sure exactly when the symptoms started.  She lives at home with her disabled adult daughter who she is the primary caregiver.  Modified rankin score: 2-Slight disability-UNABLE to perform all activities but does not need assistance  NIHSS components Score: Comment  1a Level of Conscious 0[x]  1[]  2[]  3[]      1b LOC Questions 0[x]  1[]  2[]       1c LOC Commands 0[x]  1[]  2[]       2 Best Gaze 0[x]  1[]  2[]       3 Visual 0[]  1[]  2[x]  3[]      4 Facial Palsy 0[]  1[x]  2[]  3[]      5a Motor Arm - left 0[x]  1[]  2[]  3[]  4[]  UN[]    5b Motor Arm - Right 0[]  1[x]  2[]  3[]  4[]  UN[]    6a Motor Leg - Left 0[x]  1[]  2[]  3[]  4[]  UN[]    6b Motor Leg - Right 0[]  1[]  2[x]  3[]  4[]  UN[]    7 Limb Ataxia 0[x]  1[]  2[]  UN[]      8 Sensory 0[]  1[x]  2[]  UN[]      9 Best Language 0[x]  1[]  2[]  3[]      10 Dysarthria 0[]  1[x]  2[]  UN[]      11 Extinct. and Inattention 0[]  1[]  2[x]       TOTAL:  10       ROS   Comprehensive ROS performed and pertinent positives documented in HPI   Past History   Past Medical History:  Diagnosis Date   Adnexal mass    Anxiety    Chronic back pain    Depression    Diabetes mellitus without complication (HCC)    Endometriosis 03/12/2018   GERD (gastroesophageal reflux disease)    Heart murmur 02/2018   undetected until she was an adult. 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   Weight loss 12/06/2017    Past Surgical History:  Procedure Laterality Date   ABDOMINAL HYSTERECTOMY  2008   ovaries also   CESAREAN SECTION  1994   COLONOSCOPY     COLONOSCOPY WITH PROPOFOL  N/A 02/12/2018   Procedure: COLONOSCOPY WITH PROPOFOL ;  Surgeon: Toledo, Alphonsus Jeans, MD;  Location: [facility];  Service: Gastroenterology;  Laterality: N/A;   ESOPHAGOGASTRODUODENOSCOPY (EGD) WITH PROPOFOL  N/A 02/12/2018   Procedure: ESOPHAGOGASTRODUODENOSCOPY (EGD) WITH  PROPOFOL ;  Surgeon: Toledo, Alphonsus Jeans, MD;  Location: [facility];  Service: Gastroenterology;  Laterality: N/A;   EYE SURGERY Left 1973   muscle shortening to straighten cross eyes   LAPAROSCOPY N/A 02/20/2018   Procedure: LAPAROSCOPY DIAGNOSTIC, ( RESECTION OF RIGHT LOWER QUADRANT ABDOMINAL MASS);  Surgeon: Franki Isles, MD;  Location: [facility] ORS;  Service: General;  Laterality: N/A;   ROTATOR CUFF REPAIR Left 2011    Family History: Family History  Problem Relation Age of Onset   Melanoma Mother    Lung cancer Father    Ovarian cancer Maternal Grandmother    Breast cancer Neg Hx     Social History  reports that she has never smoked. She has never used smokeless tobacco. She reports that she does not drink alcohol and does not use drugs.  Allergies  Allergen Reactions   Etodolac Other (See Comments)    Made her feel "loopy"   Hydrocodone-Acetaminophen  Nausea And Vomiting and Nausea Only   Orphenadrine Citrate  Other (See Comments)    Made her feel "loopy". (Norflex) muscle relaxant   Penicillins Rash    Has patient had a PCN reaction causing immediate rash, facial/tongue/throat swelling, SOB or lightheadedness with hypotension: Unknown Has patient had a PCN reaction causing severe rash involving mucus membranes or skin necrosis: Unknown Has patient had a PCN reaction that required hospitalization: No Has patient had a PCN reaction occurring within the last 10 years: No Childhood reaction If all of the above answers are "NO", then may proceed with Cephalosporin use.    Tramadol Other (See Comments)    Makes her feel very tired and loopy    Medications   Current Facility-Administered Medications:     stroke: early stages of recovery book, , Does not apply, Once, Avi Body, MD   acetaminophen  (TYLENOL ) tablet 650 mg, 650 mg, Oral, Q4H PRN, 650 mg at 12/21/23 0404 **OR** acetaminophen  (TYLENOL ) 160 MG/5ML solution 650 mg, 650 mg, Per Tube, Q4H PRN **OR** acetaminophen  (TYLENOL ) suppository 650 mg, 650 mg, Rectal, Q4H PRN, Avi Body, MD   aspirin  EC tablet 81 mg, 81 mg, Oral, Daily, Avi Body, MD   clonazePAM  (KLONOPIN ) tablet 1 mg, 1 mg, Oral, QHS, Avi Body, MD, 1 mg at 12/20/23 2129   enoxaparin  (LOVENOX ) injection 40 mg, 40 mg, Subcutaneous, Q24H, Avi Body, MD, 40 mg at 12/20/23 2129   feeding supplement (ENSURE ENLIVE / ENSURE PLUS) liquid 237 mL, 237 mL, Oral, BID BM, Avi Body, MD   pantoprazole  (PROTONIX ) EC tablet 40 mg, 40 mg, Oral, QAC breakfast, Avi Body, MD   pneumococcal 20-valent conjugate vaccine (PREVNAR 20) injection 0.5 mL, 0.5 mL, Intramuscular, Tomorrow-1000, Avi Body, MD   rosuvastatin  (CRESTOR ) tablet 20 mg, 20 mg, Oral, Daily, Basaraba, Iulia, MD, 20 mg at 12/20/23 1648   senna-docusate (Senokot-S) tablet 1 tablet, 1 tablet, Oral, QHS PRN, Avi Body, MD   venlafaxine  XR (EFFEXOR -XR) 24 hr capsule 37.5 mg, 37.5 mg, Oral,  Daily, Avi Body, MD  Vitals   Vitals:   12/20/23 1500 12/20/23 1537 12/20/23 2012 12/21/23 0354  BP: (!) 155/89 (!) 119/98 (!) 157/81 129/81  Pulse: 90 96 (!) 101 85  Resp: 18 18 16 16   Temp:  98.5 F (36.9 C) 98.2 F (36.8 C) (!) 97.5 F (36.4 C)  TempSrc:      SpO2: 100% 100% 100% 99%    There is no height or weight on file to calculate BMI.  Physical Exam   Gen: patient lying in  bed, NAD CV: extremities appear well-perfused Resp: normal WOB  Neurologic Examination   MS: alert, oriented x4, follows commands Speech: mild dysarthria, no aphasia CN: PERRL, R homonymous hemianopsia, R visual neglect, EOMI, R sided numbness, R lower facial droop, hearing intact to voice Motor: LUE and LLE no drift, RUE drift but not to bed, RLE drift to bed, R grip weaker than L Sensory: Sensory deficit RUE, R sided hemineglect Reflexes: 2+ symm with toes down bilat Coordination: no ataxia on FNF Gait: deferred   Labs/Imaging/Neurodiagnostic studies   CBC:  Recent Labs  Lab 01/17/24 0909 12/21/23 0505  WBC 7.6 5.0  NEUTROABS 5.7 2.8  HGB 13.4 12.8  HCT 38.2 36.0  MCV 91.0 89.6  PLT 220 183   Basic Metabolic Panel:  Lab Results  Component Value Date   NA 138 12/21/2023   K 3.1 (L) 12/21/2023   CO2 21 (L) 12/21/2023   GLUCOSE 161 (H) 12/21/2023   BUN 16 12/21/2023   CREATININE 1.10 (H) 12/21/2023   CALCIUM  8.7 (L) 12/21/2023   GFRNONAA 55 (L) 12/21/2023   GFRAA >60 01/30/2019   Lipid Panel:  Lab Results  Component Value Date   LDLCALC 80 01/17/24   HgbA1c:  Lab Results  Component Value Date   HGBA1C 6.4 (H) 01/29/2019   Urine Drug Screen: No results found for: "LABOPIA", "COCAINSCRNUR", "LABBENZ", "AMPHETMU", "THCU", "LABBARB"  Alcohol Level No results found for: "ETH" INR  Lab Results  Component Value Date   INR 0.96 02/15/2018   APTT  Lab Results  Component Value Date   APTT 28 02/15/2018   AED levels: No results found for: "PHENYTOIN",  "ZONISAMIDE", "LAMOTRIGINE", "LEVETIRACETA"  CT angio Head and Neck with contrast(Personally reviewed): 1. Acute infarction affecting several adjacent gyri in the left parietal region. Mild swelling but no mass effect or hemorrhage. 2. Severe stenosis or nonocclusive thrombus at the left MCA bifurcation affecting the inferior division. Nonocclusive embolus is visible within a left parietal branch as well. This would be M4. 3. Aortic atherosclerosis. 4. Soft plaque in the midportion of the left common carotid artery with stenosis of 25%. 5. Mild atherosclerotic change at the right carotid bifurcation but no stenosis.   MRI Brain(Personally reviewed): 1. Patchy acute cortical/subcortical infarcts within the left parietal and occipital lobes individually measuring up to 3 cm (MCA vascular territory). Subtle petechial hemorrhage associated with some of these infarcts. No frank hemorrhagic conversion. 2. Additional punctate left MCA territory cortical acute infarct within the left insula. 3. Background parenchymal atrophy and chronic small vessel ischemic disease with chronic lacunar infarcts, as described. 4. Paranasal sinus disease as outlined (including severe right maxillary sinusitis).  ASSESSMENT   Haley Jones is a 68 y.o. female with hx of hypertension, B12 deficiency, type 2 diabetes, hyperlipidemia, CKD stage III who presented to the ED due to right-sided weakness, sensory deficit, and hemineglect and was found to have a L MCA infarct likely 2/2 severe stenosis vs nonocclusive thrombus at L MCA bifurcation. This is favored to be embolic given there is also L M4 nonocclusive embolus. The infarct is already completed and there would be no benefit to anticoagulation or revascularization at this time.  RECOMMENDATIONS   - Admit for stroke workup - Permissive HTN x48 hrs from sx onset goal BP <220/120. PRN labetalol or hydralazine  if BP above these parameters. Avoid oral  antihypertensives. - TTE  - Check A1c and LDL + add statin per guidelines - ASA 81mg  daily + plavix 75mg  daily x90  days f/b ASA 81mg  daily monotherapy after that - q4 hr neuro checks - STAT head CT for any change in neuro exam - Tele - PT/OT/SLP - Stroke education - Amb referral to neurology upon discharge   Will continue to follow  ______________________________________________________________________    Signed, Eleni Griffin, MD [facility]

## 2023-12-21 NOTE — Plan of Care (Signed)
  Problem: Education: Goal: Knowledge of disease or condition will improve Outcome: Progressing   Problem: Education: Goal: Knowledge of General Education information will improve Description: Including pain rating scale, medication(s)/side effects and non-pharmacologic comfort measures Outcome: Progressing   Problem: Activity: Goal: Risk for activity intolerance will decrease Outcome: Progressing

## 2023-12-21 NOTE — Evaluation (Signed)
 Speech Language Pathology Evaluation Patient Details Name: Aleera Binkowski MRN: [id number] DOB: 02-Mar-1956 Today's Date: 12/21/2023 Time: 4098-1191 SLP Time Calculation (min) (ACUTE ONLY): 45 min  Problem List:  Patient Active Problem List   Diagnosis Date Noted   Acute CVA (cerebrovascular accident) (HCC) 12/20/2023   Primary hypertension 12/20/2023   Acute kidney injury superimposed on chronic kidney disease (HCC) 12/20/2023   DM type 2 with diabetic mixed hyperlipidemia (HCC) 11/17/2022   Major depressive disorder, recurrent, 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/17/2022   B12 deficiency 02/23/2021   SBO (small bowel obstruction) (HCC) 01/29/2019   Barrett's esophagus without dysplasia 04/15/2018   Weight loss 12/06/2017   Hoarse 12/06/2017   Lumbar disc disease 07/26/2017   Diabetes mellitus type 2, controlled, without complications (HCC) 02/09/2015   Surgical menopause 11/26/2014   Past Medical History:  Past Medical History:  Diagnosis Date   Adnexal mass    Anxiety    Chronic back pain    Depression    Diabetes mellitus without complication (HCC)    Endometriosis 03/12/2018   GERD (gastroesophageal reflux disease)    Heart murmur 02/2018   undetected until she was an adult. 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   Weight loss 12/06/2017   Past Surgical History:  Past Surgical History:  Procedure Laterality Date   ABDOMINAL HYSTERECTOMY  2008   ovaries also   CESAREAN SECTION  1994   COLONOSCOPY     COLONOSCOPY WITH PROPOFOL  N/A 02/12/2018   Procedure: COLONOSCOPY WITH PROPOFOL ;  Surgeon: Toledo, Alphonsus Jeans, MD;  Location: [facility];  Service: Gastroenterology;  Laterality: N/A;   ESOPHAGOGASTRODUODENOSCOPY (EGD) WITH PROPOFOL  N/A 02/12/2018   Procedure: ESOPHAGOGASTRODUODENOSCOPY (EGD) WITH PROPOFOL ;  Surgeon: Toledo, Alphonsus Jeans, MD;  Location: [facility];  Service: Gastroenterology;   Laterality: N/A;   EYE SURGERY Left 1973   muscle shortening to straighten cross eyes   LAPAROSCOPY N/A 02/20/2018   Procedure: LAPAROSCOPY DIAGNOSTIC, ( RESECTION OF RIGHT LOWER QUADRANT ABDOMINAL MASS);  Surgeon: Franki Isles, MD;  Location: [facility] ORS;  Service: General;  Laterality: N/A;   ROTATOR CUFF REPAIR Left 2011   HPI:  Pt is a 68 y.o. 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 She did report she has several family members that live around her, or live in town.    MRI: Patchy acute cortical/subcortical infarcts within the left  parietal and occipital lobes individually measuring up to 3 cm (MCA  vascular territory). Subtle petechial hemorrhage associated with  some of these infarcts. No frank hemorrhagic conversion.  2. Additional punctate left MCA territory cortical acute infarct  within the left insula.  3. Background parenchymal Atrophy and Chronic small vessel ischemic  disease with Chronic lacunar infarcts, as described.    Assessment / Plan / Recommendation Clinical Impression   Pt seen today for informal cognitive-communication and language assessment at bedside. Pt awake/alert, verbal and talkative, min distracted at times. She followed instructions appropriately w/ cue. Appropriate engagement during session. Noted she repeated 1-2 topics of conversation. Unsure of pt's Baseline Cognitive functioning. Noted MRI results; some R side neglect.  On RA; afebrile. WBC WNL.   Pt  presents with Mild deficits in cognitive-communication abilities/skills; functional verbal communication in known conversation topics. Impairments characterized by deficits in the areas of lengthy attention, memory recall, and mental flexibility which also seemed to  impact Auditory Comprehension during certain tasks. 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 Unknown if any h/o cognitive deficits- see MRI results.    During cognitive-communication tasks, pt was oriented to general self and time. She was able to identify basic problem situations and indicated need for "911" contact such as during an injury or severe illness to self or another. She exhibited decreased anticipatory awareness and problem-solving for situations; she was unaware of her own self-deficits and how to correct them w/out support of MOD cues. Memory recall and verbal complex reasoning tasks were inconsistent w/out MOD cues.  Pt was able to complete general language tasks in discussion of basic ADLs, some lack of details. She was able to answer basic Y/N questions and follow 1-2 step commands; no gross expressive language deficits were noted. Suspect her min decline in focus/attention impacted follow through during auditory comprehension tasks. Pt's performance improved with cueing.   Suspect pt may have new deficits in cognitive-communication abilities and would best benefit from f/u at next venue of care w/ further, more formal, assessment of communication abilities. F/u for intervention could be beneficial re: assessing cognitive-communication performance in ADLs in a more natural environment to best ensure Safety in her D/C environment. Recommend further Acute ST services in the Inpatient vs Rehab setting to address further assessment and tx needs in order to promote pt independence and safety in her D/C environment. Pt in agreement w/ plan. Team updated.      SLP Assessment  SLP Recommendation/Assessment: All further Speech Lanaguage Pathology  needs can be addressed in the next venue of care SLP Visit Diagnosis: Cognitive communication deficit (R41.841)     Recommendations for follow up therapy are one component of a multi-disciplinary discharge planning process, led by the attending physician.  Recommendations may be updated based on patient status, additional functional criteria and insurance authorization.    Follow Up Recommendations  Follow physician's recommendations for discharge plan and follow up therapies (f/u at next venue of care - CIR; HH)    Assistance Recommended at Discharge  Frequent or constant Supervision/Assistance (r sided weakness and decreased attention)  Functional Status Assessment Patient has had a recent decline in their functional status and demonstrates the ability to make significant improvements in function in a reasonable and predictable amount of time.  Frequency and Duration  (tbd)   (tbd)      SLP Evaluation Cognition  Overall Cognitive Status: No family/caregiver present to determine baseline cognitive functioning Arousal/Alertness: Awake/alert Orientation Level: Oriented to person;Oriented to place;Oriented to time;Disoriented to situation (could not give details) Year: 2025 Month: May Attention: Focused Focused Attention: Impaired (min distracted easily) Memory: Impaired Memory Impairment: Decreased recall of new information (5 words task) Awareness: Impaired (re: her R sided weakness) Awareness Impairment: Anticipatory impairment Problem Solving:  (min - wasn't aware she was leaning in the chair and needed to correct positioning) Executive Function: Reasoning;Decision Making Reasoning: Impaired Reasoning Impairment: Verbal complex;Functional complex Decision Making: Impaired Decision Making Impairment: Verbal complex;Functional complex Safety/Judgment: Impaired       Comprehension  Auditory Comprehension Overall Auditory Comprehension: Appears within functional limits for tasks assessed Yes/No Questions: Within Functional Limits (basic-min complex) Commands: Within Functional Limits (1-2  step) Conversation: Simple Other Conversation Comments: distracted easily Interfering Components: Attention EffectiveTechniques: Repetition Visual Recognition/Discrimination Discrimination: Not tested Reading Comprehension Reading Status: Impaired (vision deficits)    Expression Expression Primary Mode of Expression: Verbal Verbal Expression Overall Verbal Expression: Appears within functional limits for tasks assessed Initiation: No impairment Automatic Speech: Name;Social Response;Counting;Day of week (wfl) Level of Generative/Spontaneous Verbalization: Conversation Repetition: No impairment Naming: No impairment Pragmatics: No impairment (wfl) Non-Verbal Means of Communication: Not applicable Written Expression Written Expression: Not tested   Oral / Motor  Oral Motor/Sensory Function Overall Oral Motor/Sensory Function: Within functional limits Motor Speech Overall Motor Speech: Appears within functional limits for tasks assessed Respiration: Within functional limits Phonation: Normal Resonance: Within functional limits Articulation: Within functional limitis Intelligibility: Intelligible Motor Planning: Witnin functional limits              Darla Edward, MS, CCC-SLP Speech Language Pathologist Rehab Services; [facility] [place] [phone] (ascom) Lachrista Heslin 12/21/2023, 2:49 PM

## 2023-12-21 NOTE — Progress Notes (Signed)
 Inpatient Rehab Admissions Coordinator Note:   Per therapy recommendations patient was screened for CIR candidacy by Mickey Alar, PT. At this time, pt appears to be a potential candidate for CIR. I will place an order for rehab consult for full assessment, per our protocol.  Please contact me any with questions.Loye Rumble, PT, DPT [phone] 12/21/23 12:57 PM

## 2023-12-22 DIAGNOSIS — I639 Cerebral infarction, unspecified: Secondary | ICD-10-CM | POA: Diagnosis not present

## 2023-12-22 LAB — GLUCOSE, CAPILLARY
Glucose-Capillary: 353 mg/dL — ABNORMAL HIGH (ref 70–99)
Glucose-Capillary: 167 mg/dL — ABNORMAL HIGH (ref 70–99)
Glucose-Capillary: 339 mg/dL — ABNORMAL HIGH (ref 70–99)

## 2023-12-22 LAB — BASIC METABOLIC PANEL WITH GFR
Anion gap: 8 (ref 5–15)
BUN: 17 mg/dL (ref 8–23)
CO2: 23 mmol/L (ref 22–32)
Calcium: 8.7 mg/dL — ABNORMAL LOW (ref 8.9–10.3)
Chloride: 105 mmol/L (ref 98–111)
Creatinine, Ser: 1.19 mg/dL — ABNORMAL HIGH (ref 0.44–1.00)
GFR, Estimated: 50 mL/min — ABNORMAL LOW (ref >60)
Glucose, Bld: 189 mg/dL — ABNORMAL HIGH (ref 70–99)
Potassium: 3.6 mmol/L (ref 3.5–5.1)
Sodium: 136 mmol/L (ref 135–145)

## 2023-12-22 MED ORDER — LISINOPRIL 10 MG PO TABS
10.0000 mg | ORAL_TABLET | Freq: Every day | ORAL | Status: DC
  Administered 2023-12-22 – 2023-12-24 (×3): 10 mg via ORAL
  Filled 2023-12-22 (×3): qty 1

## 2023-12-22 MED ORDER — INSULIN ASPART 100 UNIT/ML IJ SOLN
0.0000 [IU] | Freq: Three times a day (TID) | INTRAMUSCULAR | Status: DC
  Administered 2023-12-22: 9 [IU] via SUBCUTANEOUS
  Administered 2023-12-22: 2 [IU] via SUBCUTANEOUS
  Administered 2023-12-23: 5 [IU] via SUBCUTANEOUS
  Filled 2023-12-22 (×2): qty 1

## 2023-12-22 MED ORDER — AMLODIPINE BESYLATE 5 MG PO TABS
5.0000 mg | ORAL_TABLET | Freq: Every day | ORAL | Status: DC
  Administered 2023-12-22 – 2023-12-24 (×3): 5 mg via ORAL
  Filled 2023-12-22 (×3): qty 1

## 2023-12-22 MED ORDER — CLOPIDOGREL BISULFATE 75 MG PO TABS
75.0000 mg | ORAL_TABLET | Freq: Every day | ORAL | Status: DC
  Administered 2023-12-22 – 2023-12-28 (×7): 75 mg via ORAL
  Filled 2023-12-22 (×7): qty 1

## 2023-12-22 NOTE — Progress Notes (Signed)
 Visited with pt as staff shared pt was notified this am her daughter as passed, after years of battling with a sickness. Pt seemed in good head space all things considered- she shared she was grateful for the extra years she got with her daughter, and that she passed in her sleep peacefully. Offered compassionate presence and listening ear.

## 2023-12-22 NOTE — Progress Notes (Signed)
 Occupational Therapy Treatment Patient Details Name: Haley Jones MRN: [id number] DOB: [birth year] Today's Date: 12/22/2023   History of present illness Pt is a 68 y.o. 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   OT comments  Pt seen for OT treatment on this date. Upon arrival to room pt resting in bed, required encouragement to participate in session. Pt agreed to come to the EOB to progress in sitting tolerance for participation in ADL tasks that require sitting in an upright position. Pt sat on the EOB with intermittent single UE support while completing face washing tasks as well as LE kicks 10xreps. Pt educated on scanning techniques to compensate for R-sided neglect. Pt making good progress toward goals, will continue to follow POC. Discharge recommendation remains appropriate.        If plan is discharge home, recommend the following:  A little help with walking and/or transfers;A little help with bathing/dressing/bathroom;Assistance with cooking/housework;Direct supervision/assist for medications management;Assist for transportation;Help with stairs or ramp for entrance   Equipment Recommendations  Other (comment)    Recommendations for Other Services      Precautions / Restrictions Precautions Precautions: Fall Recall of Precautions/Restrictions: Impaired Restrictions Weight Bearing Restrictions Per Provider Order: No       Mobility Bed Mobility Overal bed mobility: Needs Assistance Bed Mobility: Supine to Sit, Sit to Supine     Supine to sit: Min assist, HOB elevated Sit to supine: Min assist, Used rails   General bed mobility comments: Sequencing required to complete smoothly    Transfers Overall transfer level: Needs assistance                 General transfer comment: Pt refused further  mobility on this date     Balance Overall balance assessment: Needs assistance Sitting-balance support: Feet unsupported Sitting balance-Leahy Scale: Good Sitting balance - Comments: Steady reach within BOS                                   ADL either performed or assessed with clinical judgement   ADL Overall ADL's : Needs assistance/impaired Eating/Feeding: Independent;Sitting   Grooming: Wash/dry face;Brushing hair;Sitting Grooming Details (indicate cue type and reason): Seated on EOB                               General ADL Comments: Face washing task completed; set up    Extremity/Trunk Assessment Upper Extremity Assessment Upper Extremity Assessment: Left hand dominant            Vision   Visual Fields: Right visual field deficit   Haematologist Communication Communication: No apparent difficulties   Cognition Arousal: Alert Behavior During Therapy: Impulsive, WFL for tasks assessed/performed, Lability Cognition: Cognition impaired           Executive functioning impairment (select all impairments): Sequencing, Problem solving OT - Cognition Comments: [place], emotional about recent daughter passing                 Following commands: Intact        Cueing   Cueing Techniques: Verbal cues, Gestural cues, Tactile cues  Exercises Exercises: Other exercises Other Exercises  Other Exercises: Edu: Safe bed mobility, hand placement/sequencing    Shoulder Instructions       General Comments In decent spirits all things concidered, enjoys talking about daughter    Pertinent Vitals/ Pain       Pain Assessment Pain Assessment: 0-10 Pain Score: 2  Pain Location: RLE Pain Descriptors / Indicators: Grimacing, Guarding, Moaning Pain Intervention(s): Limited activity within patient's tolerance, Monitored during session, Repositioned  Home Living                         Bathroom  Toilet: Handicapped height Bathroom Accessibility: Yes How Accessible: Accessible via walker            Prior Functioning/Environment              Frequency  Min 3X/week        Progress Toward Goals  OT Goals(current goals can now be found in the care plan section)  Progress towards OT goals: Progressing toward goals  Acute Rehab OT Goals Patient Stated Goal: get better OT Goal Formulation: With patient Time For Goal Achievement: 01/04/24 Potential to Achieve Goals: Good ADL Goals Pt Will Perform Lower Body Dressing: sit to/from stand;with supervision Pt Will Transfer to Toilet: ambulating;with supervision;bedside commode;regular height toilet Pt Will Perform Toileting - Clothing Manipulation and hygiene: with modified independence;sitting/lateral leans;sit to/from stand Additional ADL Goal #1: Pt will complete ADL tasks requiring MIN VC for R side attention/involvement, sequencing, and safety, 10/10 opportunities.  Plan      Co-evaluation        PT goals addressed during session: Mobility/safety with mobility;Balance;Proper use of DME        AM-PAC OT "6 Clicks" Daily Activity     Outcome Measure   Help from another person eating meals?: None Help from another person taking care of personal grooming?: A Little Help from another person toileting, which includes using toliet, bedpan, or urinal?: A Little Help from another person bathing (including washing, rinsing, drying)?: A Little Help from another person to put on and taking off regular upper body clothing?: A Little Help from another person to put on and taking off regular lower body clothing?: A Little 6 Click Score: 19    End of Session Equipment Utilized During Treatment: Oxygen  OT Visit Diagnosis: Other symptoms and signs involving cognitive function;Hemiplegia and hemiparesis;Low vision, both eyes (H54.2);History of falling (Z91.81) Hemiplegia - Right/Left: Right Hemiplegia -  dominant/non-dominant: Non-Dominant Hemiplegia - caused by: Cerebral infarction   Activity Tolerance Patient tolerated treatment well   Patient Left in bed;with call bell/phone within reach;with bed alarm set   Nurse Communication Mobility status        Time: 1610-9604 OT Time Calculation (min): 14 min  Charges: OT General Charges $OT Visit: 1 Visit OT Treatments $Self Care/Home Management : 8-22 mins  Rosaria Common M.S. OTR/L  12/22/23, 3:49 PM

## 2023-12-22 NOTE — PMR Pre-admission (Signed)
 PMR Admission Coordinator Pre-Admission Assessment  Patient: Haley Jones is an 68 y.o., female MRN: [id number] DOB: [birth year] Height: 4'9" Weight: 70.3 kg  Insurance Information HMO: yes    PPO:      PCP:      IPA:      80/20:      OTHER:  PRIMARY: [employer]      Policy#: [id number], Medicare: [id number]      Subscriber: patient CM Name:       Phone#:    Fax#:  [phone]  Pre-Cert#: [id number] denial overturned on appeal.  Received call from [place] with approval on 12/27/23    Employer: Retired Benefits:  Phone #: [phone]     Name: Verified Venson Ginger Date: 08/15/2023- still active  Deductible: does not have  OOP Max: $4,000 ($20.18 met)  CIR: $160/day co-pay with a max co-pay of $1,600/admission (10 days)  SNF: $0/day co-pay for days 1-20, $50/day co-pay for days 21-100, limited to 100 days/cal yr   Outpatient:  $20 copay/visit Home Health:  100% coverage  DME: 80% coverage; 20% co-insurance   Providers: in network  SECONDARY:       Policy#:      Phone#:   Artist:       Phone#:   The Data processing manager" for patients in Inpatient Rehabilitation Facilities with attached "Privacy Act Statement-Health Care Records" was provided and verbally reviewed with: Patient  Emergency Contact Information Contact Information     Name Relation Home Work Mobile   [place] Sister [phone]  [phone]   Artemisa Bile   [phone]      Other Contacts   None on File     Current Medical History  Patient Admitting Diagnosis: L MCA CVA  History of Present Illness: A 68 year old right-handed female with history significant for hypertension, B12 deficiency, type 2 diabetes mellitus, hyperlipidemia, CKD stage III, depression.  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  She has several family members in the area who can provide assistance.  1 level home with ramped entrance.  Presented  to the ED at [facility] 12/20/2023 with  right side weakness/falls and dizziness.  By report she had had dizziness for the last 2 to 3 weeks she did see her PCP and was started on meclizine.  MRI of the brain showed patchy acute cortical/subcortical infarcts within the left parietal occipital lobes individually measuring up to 3 cm (MCA vascular territory).  Subtle petechial hemorrhage associated with some of these infarcts.  No frank hemorrhagic conversion.  Additional punctate left MCA territory cortical acute infarct within the left insula.  Background parenchymal atrophy and chronic small vessel ischemic disease with chronic lacunar infarcts.  CT angio showed severe stenosis/nonocclusive thrombus of the left MCA bifurcation affecting the inferior division.  Nonocclusive embolus also visible within the left parietal branch as well.  Admission chemistries unremarkable except glucose 262 creatinine 1.61, and latest hemoglobin A1c 8.4.  Echocardiogram ejection fraction of 60 to 65% no wall motion abnormalities grade 1 diastolic dysfunction.  Neurology follow-up placed on aspirin  81 mg daily and Plavix 75 mg daily x 90 days then Plavix alone.  Lovenox  added for DVT prophylaxis.  Patient initially with permissive hypertension with Norvasc  and lisinopril  slowly to be resumed.  Tolerating a regular consistency diet.  Therapy evaluations completed due to patient's right side weakness and decreased functional mobility.  Patient to be admitted for a comprehensive inpatient rehab program   Complete NIHSS  TOTAL: 2  Patient's medical record from [facility] has been reviewed by the rehabilitation admission coordinator and physician.  Past Medical History  Past Medical History:  Diagnosis Date   Adnexal mass    Anxiety    Chronic back pain    Depression    Diabetes mellitus without complication (HCC)    Endometriosis 03/12/2018   GERD (gastroesophageal reflux disease)    Heart murmur 02/2018    undetected until she was an adult. 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 No  Family History   family history includes Lung cancer in her father; Melanoma in her mother; Ovarian cancer in her maternal grandmother.  Current Medications  Current Facility-Administered Medications:    acetaminophen  (TYLENOL ) tablet 650 mg, 650 mg, Oral, Q4H PRN, 650 mg at 12/27/23 1959 **OR** acetaminophen  (TYLENOL ) 160 MG/5ML solution 650 mg, 650 mg, Per Tube, Q4H PRN **OR** acetaminophen  (TYLENOL ) suppository 650 mg, 650 mg, Rectal, Q4H PRN, Avi Body, MD   acetaminophen  (TYLENOL ) tablet 500 mg, 500 mg, Oral, QHS PRN, 500 mg at 12/25/23 2122 **AND** diphenhydrAMINE (BENADRYL) capsule 25 mg, 25 mg, Oral, QHS PRN, Ayiku, Bernard, MD, 25 mg at 12/23/23 2157   aspirin  EC tablet 81 mg, 81 mg, Oral, Daily, Basaraba, Iulia, MD, 81 mg at 12/28/23 1610   clonazePAM  (KLONOPIN ) tablet 1 mg, 1 mg, Oral, QHS, Avi Body, MD, 1 mg at 12/27/23 2156   clopidogrel (PLAVIX) tablet 75 mg, 75 mg, Oral, Daily, Ayiku, Bernard, MD, 75 mg at 12/28/23 0915   enoxaparin  (LOVENOX ) injection 40 mg, 40 mg, Subcutaneous, Q24H, Basaraba, Iulia, MD, 40 mg at 12/27/23 2156   feeding supplement (ENSURE ENLIVE / ENSURE PLUS) liquid 237 mL, 237 mL, Oral, BID BM, Avi Body, MD, 237 mL at 12/27/23 1713   insulin  aspart (novoLOG ) injection 0-15 Units, 0-15 Units, Subcutaneous, TID WC, Ayiku, Bernard, MD, 5 Units at 12/28/23 0915   insulin  aspart (novoLOG ) injection 0-5 Units, 0-5 Units, Subcutaneous, QHS, Ayiku, Bernard, MD, 3 Units at 12/27/23 2201   insulin  aspart (novoLOG ) injection 5 Units, 5 Units, Subcutaneous, TID WC, Sheril Dines, MD, 5 Units at 12/28/23 9604   insulin  glargine-yfgn (SEMGLEE) injection 10 Units, 10 Units, Subcutaneous,  Daily, Ayiku, Bernard, MD, 10 Units at 12/28/23 0915   pantoprazole  (PROTONIX ) EC tablet 40 mg, 40 mg, Oral, QAC breakfast, Avi Body, MD, 40 mg at 12/28/23 5409   pneumococcal 20-valent conjugate vaccine (PREVNAR 20) injection 0.5 mL, 0.5 mL, Intramuscular, Tomorrow-1000, Avi Body, MD   rosuvastatin  (CRESTOR ) tablet 20 mg, 20 mg, Oral, Daily, Avi Body, MD, 20 mg at 12/28/23 0914   senna-docusate (Senokot-S) tablet 1 tablet, 1 tablet, Oral, QHS PRN, Avi Body, MD   venlafaxine  XR (EFFEXOR -XR) 24 hr capsule 37.5 mg, 37.5 mg, Oral, Daily, Avi Body, MD, 37.5 mg at 12/28/23 0915  Patients Current Diet:  Diet Order             Diet - low sodium heart healthy           Diet Carb Modified Fluid consistency: Thin  Diet effective now                   Precautions / Restrictions Precautions Precautions: Fall Precaution/Restrictions Comments: Decreased safety awareness Restrictions Weight Bearing Restrictions Per Provider  Order: No   Has the patient had 2 or more falls or a fall with injury in the past year? Yes  Prior Activity Level Community (5-7x/wk): pt active in the community PTA  Prior Functional Level Self Care: Did the patient need help bathing, dressing, using the toilet or eating? Independent  Indoor Mobility: Did the patient need assistance with walking from room to room (with or without device)? Independent  Stairs: Did the patient need assistance with internal or external stairs (with or without device)? Independent  Functional Cognition: Did the patient need help planning regular tasks such as shopping or remembering to take medications? Independent  Patient Information Are you of Hispanic, Latino/a,or Spanish origin?: A. No, not of Hispanic, Latino/a, or Spanish origin What is your race?: A. White Do you need or want an interpreter to communicate with a doctor or health care staff?: 0. No  Patient's Response To:  Health Literacy  and Transportation Is the patient able to respond to health literacy and transportation needs?: Yes Health Literacy - How often do you need to have someone help you when you read instructions, pamphlets, or other written material from your doctor or pharmacy?: Never In the past 12 months, has lack of transportation kept you from medical appointments or from getting medications?: No In the past 12 months, has lack of transportation kept you from meetings, work, or from getting things needed for daily living?: No  Journalist, newspaper / Equipment Home Equipment: Grab bars - tub/shower, Information systems manager, Rollator (4 wheels)  Prior Device Use: Indicate devices/aids used by the patient prior to current illness, exacerbation or injury? Walker  Current Functional Level Cognition  Arousal/Alertness: Awake/alert Overall Cognitive Status: No family/caregiver present to determine baseline cognitive functioning Orientation Level: Oriented X4 Attention: Focused Focused Attention: Impaired (min distracted easily) Memory: Impaired Memory Impairment: Decreased recall of new information (5 words task) Awareness: Impaired (re: her R sided weakness) Awareness Impairment: Anticipatory impairment Problem Solving:  (min - wasn't aware she was leaning in the chair and needed to correct positioning) Executive Function: Reasoning, Decision Making Reasoning: Impaired Reasoning Impairment: Verbal complex, Functional complex Decision Making: Impaired Decision Making Impairment: Verbal complex, Functional complex Safety/Judgment: Impaired    Extremity Assessment (includes Sensation/Coordination)  Upper Extremity Assessment: Left hand dominant, RUE deficits/detail RUE Deficits / Details: noted improvements in RUE fuction- shoulder flexion AROM approx 90 degrees, elbow approx 3/4 full AROM, wrist 3/4 full AROM; MAS 0 in shoulder, 1 in elbow flexion/extension RUE: Shoulder pain with ROM RUE Coordination: decreased  fine motor  Lower Extremity Assessment: Defer to PT evaluation RLE Deficits / Details: pt unable to extinguish light touch on RLE, inattention noted throughout. when MMT position achieved, strength in knee 5/5, decreased hip flexion strength 4-/5, unable to MMT DF/PF truly due to difficulty following commands, but noted for some active DF/PF LLE Deficits / Details: WFLs    ADLs  Overall ADL's : Needs assistance/impaired Eating/Feeding: Supervision/ safety, Sitting Eating/Feeding Details (indicate cue type and reason): frequent mutli modal cues for incorporation of RUE Grooming: Wash/dry face, Sitting, Supervision/safety Grooming Details (indicate cue type and reason): Cues for attention to task Upper Body Bathing: Moderate assistance, Sitting Upper Body Bathing Details (indicate cue type and reason): frequent multi modal cues for incorporation of RUE, pt does head turn and looks at R arm pit with cueing from therapist while washing with LUE; Lower Body Bathing: Moderate assistance, Sit to/from stand Upper Body Dressing : Minimal assistance, Sitting Upper Body Dressing Details (indicate  cue type and reason): frequent multi modal cues for hemi dressing technique Lower Body Dressing: Maximal assistance Toilet Transfer: Minimal assistance, Moderate assistance, Ambulation, Cueing for sequencing Toilet Transfer Details (indicate cue type and reason): simulated with 2 person HHA Toileting- Clothing Manipulation and Hygiene: Maximal assistance, Sit to/from stand Toileting - Clothing Manipulation Details (indicate cue type and reason): for thoroughness Functional mobility during ADLs: Minimal assistance, Moderate assistance, Cueing for sequencing, Cueing for safety, +2 for physical assistance, +2 for safety/equipment (two person HHA approx 75') General ADL Comments: Toilet t/f MODA , DME management due to R sided deficits, CGA pericare    Mobility  Overal bed mobility: Needs Assistance Bed Mobility:  Supine to Sit Supine to sit: Min assist, HOB elevated Sit to supine: Contact guard assist, Used rails General bed mobility comments: step by step multi modal cues for technique    Transfers  Overall transfer level: Needs assistance Equipment used: None Transfers: Sit to/from Stand Sit to Stand: Min assist (via 1 HHA) General transfer comment: Static standing at RW for several minutes to assist with hygiene after incontinent of urine    Ambulation / Gait / Stairs / Wheelchair Mobility  Ambulation/Gait Ambulation/Gait assistance: Editor, commissioning (Feet): 10 Feet Assistive device: Rolling walker (2 wheels) Gait Pattern/deviations: Step-to pattern, Decreased stride length, Decreased dorsiflexion - right, Decreased step length - right, Drifts right/left General Gait Details: High fall risk, unsteady with changes in direction and navigating around objects Gait velocity: decreased    Posture / Balance Dynamic Sitting Balance Sitting balance - Comments: two posterior LOB in sitting Balance Overall balance assessment: Needs assistance Sitting-balance support: Feet supported Sitting balance-Leahy Scale: Fair Sitting balance - Comments: two posterior LOB in sitting Postural control: Posterior lean Standing balance support: Bilateral upper extremity supported, During functional activity, Reliant on assistive device for balance Standing balance-Leahy Scale: Fair Standing balance comment: High fall risk    Special needs/care consideration H/o DM - insulin  coverage in acute hospital   Previous Home Environment (from acute therapy documentation) Living Arrangements: Children  Lives With: Daughter Available Help at Discharge: Family, Other (Comment) (other family members in town for support; Comptroller in the home during the week(for Dtr?)) Type of Home: Mobile home Home Layout: One level Home Access: Ramped entrance Bathroom Shower/Tub: Health visitor: Handicapped  height Bathroom Accessibility: Yes How Accessible: Accessible via walker Home Care Services: No Additional Comments: reported 5 falls in the last month. daughter also have a school she goes to mon-fri, via transport  Discharge Living Setting Plans for Discharge Living Setting: House Type of Home at Discharge: House Discharge Home Layout: One level Discharge Home Access: Ramped entrance Discharge Bathroom Shower/Tub: Walk-in shower Discharge Bathroom Toilet: Handicapped height Discharge Bathroom Accessibility: Yes How Accessible: Accessible via walker Does the patient have any problems obtaining your medications?: No  Social/Family/Support Systems Patient Roles: Other (Comment) Contact Information: Sister karen green Anticipated Caregiver: [phone] Ability/Limitations of Caregiver: min A Caregiver Availability: 24/7 Discharge Plan Discussed with Primary Caregiver: Yes Is Caregiver In Agreement with Plan?: Yes Does Caregiver/Family have Issues with Lodging/Transportation while Pt is in Rehab?: No  Goals Patient/Family Goal for Rehab: PT/OT/SLP supervision Expected length of stay: 10-12 days Pt/Family Agrees to Admission and willing to participate: Yes Program Orientation Provided & Reviewed with Pt/Caregiver Including Roles  & Responsibilities: Yes  Decrease burden of Care through IP rehab admission: not anticipated  Possible need for SNF placement upon discharge: not anticipated  Patient Condition: I have reviewed medical records  from [facility] , spoken with CM, and patient and family member. I met with patient at the bedside for inpatient rehabilitation assessment.  Patient will benefit from ongoing PT, OT, and SLP, can actively participate in 3 hours of therapy a day 5 days of the week, and can make measurable gains during the admission.  Patient will also benefit from the coordinated team approach during an Inpatient Acute Rehabilitation admission.  The  patient will receive intensive therapy as well as Rehabilitation physician, nursing, social worker, and care management interventions.  Due to safety, skin/wound care, disease management, medication administration, pain management, and patient education the patient requires 24 hour a day rehabilitation nursing.  The patient is currently min assist with mobility and basic ADLs.  Discharge setting and therapy post discharge at home with home health is anticipated.  Patient has agreed to participate in the Acute Inpatient Rehabilitation Program and will admit today.  Preadmission Screen Completed By:  Chilton Couch, 12/28/2023 11:47 AM ______________________________________________________________________   Discussed status with Dr. Rachel Budds on 12/28/23 at 0930 and received approval for admission today.  Admission Coordinator:  Chilton Couch, RN, time 1152/Date 12/28/23   Assessment/Plan: Diagnosis: CVA left MCA Does the need for close, 24 hr/day Medical supervision in concert with the patient's rehab needs make it unreasonable for this patient to be served in a less intensive setting? Yes Co-Morbidities requiring supervision/potential complications: HTN, hypokalemia, DM2, HLD, Vit B 12 def, anxiety, depression Due to bladder management, bowel management, safety, skin/wound care, disease management, medication administration, pain management, and patient education, does the patient require 24 hr/day rehab nursing? Yes Does the patient require coordinated care of a physician, rehab nurse, PT, OT, and SLP to address physical and functional deficits in the context of the above medical diagnosis(es)? Yes Addressing deficits in the following areas: balance, endurance, locomotion, strength, transferring, bowel/bladder control, bathing, dressing, feeding, grooming, toileting, cognition, speech, language, and psychosocial support Can the patient actively participate in an intensive therapy program of at least 3  hrs of therapy 5 days a week? Yes The potential for patient to make measurable gains while on inpatient rehab is excellent Anticipated functional outcomes upon discharge from inpatient rehab: supervision PT, supervision OT, supervision SLP Estimated rehab length of stay to reach the above functional goals is: 10-12 Anticipated discharge destination: Home 10. Overall Rehab/Functional Prognosis: excellent   MD Signature: Lylia Sand

## 2023-12-22 NOTE — Progress Notes (Signed)
 Inpatient Rehab Admissions Coordinator:    I met with pt. To discuss potential CIR admit. She and sister were present. They are interested and sister states that she and other siblings and friends can rotate to provide 24/7 support. I will open case with insurance and pursue for admit.   Wandalee Gust, MS, CCC-SLP Rehab Admissions Coordinator  [phone] (celll) [phone] (office)

## 2023-12-22 NOTE — Progress Notes (Signed)
 Physical Therapy Treatment Patient Details Name: Haley Jones MRN: [id number] DOB: 07/08/1956 Today's Date: 12/22/2023   History of Present Illness Pt is a 68 y.o. 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    PT Comments  Pt was long sitting in bed upon arrival. She is alert and oriented x 4 with supportive sister at bedside. Pt does have some cognition deficits come to light during session. Per pt's sister, baseline cognition deficits but not like she is currently present. Pt has word finding troubles at times but overall easily abel to make needs known/express herself. Continues to have severe R side neglect and constant vcs and tcs for attention /awareness. Pt was able to exit bed, stand, and tolerate ambulate short distance. Remains at high risk of falls.  CIR remains best post acute option at DC to maximize her independence and safety with all ADLs.    If plan is discharge home, recommend the following: A lot of help with walking and/or transfers;A little help with bathing/dressing/bathroom;Assistance with cooking/housework;Supervision due to cognitive status;Assist for transportation;Help with stairs or ramp for entrance;Direct supervision/assist for medications management     Equipment Recommendations  Other (comment) (Defer to next level of care)       Precautions / Restrictions Precautions Precautions: Fall Recall of Precautions/Restrictions: Impaired Restrictions Weight Bearing Restrictions Per Provider Order: No     Mobility  Bed Mobility Overal bed mobility: Needs Assistance Bed Mobility: Supine to Sit  Supine to sit: Min assist, Mod assist, Used rails   Transfers Overall transfer level: Needs assistance Equipment used: Rolling walker (2 wheels) Transfers: Sit to/from Stand Sit to Stand: Min assist  General  transfer comment: pt has severe R side neglect but with vcing and attention is able to attend to it. Constant vcs throughout session for awareness.    Ambulation/Gait Ambulation/Gait assistance: Min assist, Mod assist Gait Distance (Feet): 30 Feet Assistive device: Rolling walker (2 wheels) Gait Pattern/deviations: Step-to pattern, Narrow base of support Gait velocity: decreased  General Gait Details: Pt was able to ambulate 30 ft with RW however struggles with navigating RW and runs her R foot over several times. Constant vcs for awareness due to severity of neglect.   Balance Overall balance assessment: Needs assistance Sitting-balance support: Feet supported Sitting balance-Leahy Scale: Good     Standing balance support: Bilateral upper extremity supported, During functional activity, Single extremity supported Standing balance-Leahy Scale: Poor Standing balance comment: High fall risk       Communication Communication Communication: No apparent difficulties  Cognition Arousal: Alert Behavior During Therapy: Impulsive, WFL for tasks assessed/performed   PT - Cognitive impairments: History of cognitive impairments    PT - Cognition Comments: pt is alert and extremely cooperative. Supportive sister in room and does endorse some baseline cognition deficits but not like she is currently. "Current presentation is altered from current presentation." Following commands: Intact      Cueing Cueing Techniques: Verbal cues, Gestural cues, Tactile cues         Pertinent Vitals/Pain Pain Assessment Pain Assessment: No/denies pain     PT Goals (current goals can now be found in the care plan section) Acute Rehab PT Goals Patient Stated Goal: to feel better Progress towards PT goals: Progressing toward goals    Frequency    Min 3X/week  Co-evaluation     PT goals addressed during session: Mobility/safety with mobility;Balance;Proper use of DME        AM-PAC PT  "6 Clicks" Mobility   Outcome Measure  Help needed turning from your back to your side while in a flat bed without using bedrails?: A Little Help needed moving from lying on your back to sitting on the side of a flat bed without using bedrails?: A Little Help needed moving to and from a bed to a chair (including a wheelchair)?: A Little Help needed standing up from a chair using your arms (e.g., wheelchair or bedside chair)?: A Little Help needed to walk in hospital room?: A Lot Help needed climbing 3-5 steps with a railing? : A Lot 6 Click Score: 16    End of Session   Activity Tolerance: Patient tolerated treatment well Patient left: in chair;with call bell/phone within reach;with chair alarm set;with family/visitor present Nurse Communication: Mobility status PT Visit Diagnosis: Other abnormalities of gait and mobility (R26.89);Difficulty in walking, not elsewhere classified (R26.2);Muscle weakness (generalized) (M62.81)     Time: 1610-9604 PT Time Calculation (min) (ACUTE ONLY): 25 min  Charges:    $Gait Training: 8-22 mins $Therapeutic Activity: 8-22 mins PT General Charges $$ ACUTE PT VISIT: 1 Visit                    Chester Costa PTA 12/22/23, 1:00 PM

## 2023-12-22 NOTE — Progress Notes (Signed)
 Progress Note    Haley Jones  WUJ:[id number] DOB: 07-28-1956  DOA: 12/20/2023 PCP: Sari Cunning, MD      Brief Narrative:    Medical records reviewed and are as summarized below:  Haley Jones is a 68 y.o. female with medical history significant of Hypertension, B12 deficiency, type 2 diabetes, hyperlipidemia, CKD stage IIIa, who presented to the hospital with right-sided weakness.  She also reported a 2 to 3-week history of dizziness and she has been falling more often.  She reported this to her PCP and she was started on meclizine.  Her sister came to visit her a day prior to admission and she noticed that she was not moving her right hand well.  Patient could not tell exactly when the symptoms started.  Patient is a primary caregiver for her disabled 70 year old daughter at home.   She was admitted to the hospital for left MCA stroke.     Assessment/Plan:   Principal Problem:   Acute CVA (cerebrovascular accident) [facility]) Active Problems:   Acute kidney injury superimposed on chronic kidney disease (HCC)   B12 deficiency   DM type 2 with diabetic mixed hyperlipidemia (HCC)   Primary hypertension    Acute left MCA stroke: Neurologist recommended dual antiplatelet therapy with low-dose aspirin  and Plavix for 90 days followed by Plavix monotherapy.  Continue Crestor . 2D echo showed EF estimated at 60 to 65%, grade 1 diastolic dysfunction.  No interatrial shunt. PT recommended rehab at the inpatient rehab center. Follow-up with physiatrist and TOC to assist with disposition.   AKI on CKD stage IIIa: Improved.  Creatinine down from 1.61-1.10.   Hypokalemia: Improved   Hypertension: BP is elevated.  Restart lisinopril  and amlodipine .   Type II DM with hyperglycemia: NovoLog  as needed for hyperglycemia.  Dulaglutide , metformin, glimepiride , glipizide and Jardiance have been held Hemoglobin A1c was 8.4 on 11/26/2023   Hyperlipidemia: Triglycerides  154, total cholesterol 170, HDL 59 LDL 80.  Continue Crestor    Vitamin B12 deficiency: She said she prefers to take the injections instead of pills.  Patient said she got a vitamin B12 shot at her doctor's office.  She has been advised to follow-up with PCP for regular vitamin B12 injections. Vitamin B12 was 175 on 11/26/2023    Comorbidities include chronic back pain, anxiety, depression, chronic sinusitis  Patient's disabled daughter passed away overnight.  Emotional support was offered to patient and her family at the bedside  Plan of care was discussed with [name], sister, at the bedside  Diet Order             Diet Carb Modified Fluid consistency: Thin  Diet effective now                            Consultants: Neurologist  Procedures: None    Medications:    aspirin  EC  81 mg Oral Daily   clonazePAM   1 mg Oral QHS   clopidogrel  75 mg Oral Daily   enoxaparin  (LOVENOX ) injection  40 mg Subcutaneous Q24H   feeding supplement  237 mL Oral BID BM   insulin  aspart  0-9 Units Subcutaneous TID WC   pantoprazole   40 mg Oral QAC breakfast   pneumococcal 20-valent conjugate vaccine  0.5 mL Intramuscular Tomorrow-1000   rosuvastatin   20 mg Oral Daily   venlafaxine  XR  37.5 mg Oral Daily   Continuous Infusions:   Anti-infectives (From admission, onward)  None              Family Communication/Anticipated D/C date and plan/Code Status   DVT prophylaxis: enoxaparin  (LOVENOX ) injection 40 mg Start: 12/20/23 2200     Code Status: Full Code  Family Communication: Plan discussed with [name], sister at the bedside Disposition Plan: Plan to discharge to inpatient rehab   Status is: Inpatient Remains inpatient appropriate because: Acute stroke         Subjective:   Interval events noted.  Patient was sad this morning because she said her disabled daughter passed away overnight.  No other complaints.  [name], her sister, and patient's  brother-in-law were at the bedside.  Objective:    Vitals:   12/22/23 0002 12/22/23 0400 12/22/23 0813 12/22/23 1151  BP: (!) 133/90 (!) 134/91 135/81 (!) 151/90  Pulse: 84 87 82 91  Resp: 18     Temp: 98.1 F (36.7 C) 98.1 F (36.7 C) 98.1 F (36.7 C) 98 F (36.7 C)  TempSrc: Oral Oral    SpO2: 98% 98% 98% 98%   No data found.   Intake/Output Summary (Last 24 hours) at 12/22/2023 1517 Last data filed at 12/22/2023 1428 Gross per 24 hour  Intake 360 ml  Output 1650 ml  Net -1290 ml   There were no vitals filed for this visit.  Exam:  GEN: NAD SKIN: Warm and dry EYES: No pallor or icterus, right visual field cut ENT: MMM CV: RRR PULM: CTA B ABD: soft, ND, NT, +BS CNS: AAO x 3, mild right hemiparesis EXT: No edema or tenderness      Data Reviewed:   I have personally reviewed following labs and imaging studies:  Labs: Labs show the following:   Basic Metabolic Panel: Recent Labs  Lab 12/20/23 0909 12/21/23 0503 12/21/23 0505 12/22/23 0643  NA 135  --  138 136  K 3.5  --  3.1* 3.6  CL 102  --  104 105  CO2 19*  --  21* 23  GLUCOSE 262*  --  161* 189*  BUN 19  --  16 17  CREATININE 1.61*  --  1.10* 1.19*  CALCIUM  9.2  --  8.7* 8.7*  MG  --  1.8  --   --    GFR CrCl cannot be calculated (Unknown ideal weight.). Liver Function Tests: Recent Labs  Lab 12/20/23 0909  AST 23  ALT 23  ALKPHOS 77  BILITOT 1.2  PROT 7.3  ALBUMIN 3.7   No results for input(s): "LIPASE", "AMYLASE" in the last 168 hours. No results for input(s): "AMMONIA" in the last 168 hours. Coagulation profile No results for input(s): "INR", "PROTIME" in the last 168 hours.  CBC: Recent Labs  Lab 12/20/23 0909 12/21/23 0505  WBC 7.6 5.0  NEUTROABS 5.7 2.8  HGB 13.4 12.8  HCT 38.2 36.0  MCV 91.0 89.6  PLT 220 183   Cardiac Enzymes: No results for input(s): "CKTOTAL", "CKMB", "CKMBINDEX", "TROPONINI" in the last 168 hours. BNP (last 3 results) No results for  input(s): "PROBNP" in the last 8760 hours. CBG: Recent Labs  Lab 12/20/23 0911 12/20/23 1627 12/22/23 1148  GLUCAP 243* 116* 353*   D-Dimer: No results for input(s): "DDIMER" in the last 72 hours. Hgb A1c: No results for input(s): "HGBA1C" in the last 72 hours. Lipid Profile: Recent Labs    12/20/23 0909  CHOL 170  HDL 59  LDLCALC 80  TRIG 154*  CHOLHDL 2.9   Thyroid function studies: No results  for input(s): "TSH", "T4TOTAL", "T3FREE", "THYROIDAB" in the last 72 hours.  Invalid input(s): "FREET3" Anemia work up: No results for input(s): "VITAMINB12", "FOLATE", "FERRITIN", "TIBC", "IRON", "RETICCTPCT" in the last 72 hours. Sepsis Labs: Recent Labs  Lab 12/20/23 0909 12/21/23 0505  WBC 7.6 5.0    Microbiology No results found for this or any previous visit (from the past 240 hours).  Procedures and diagnostic studies:  ECHOCARDIOGRAM COMPLETE Result Date: 12/20/2023    ECHOCARDIOGRAM REPORT   Patient Name:   Samatha KISTLER Dilks Date of Exam: 12/20/2023 Medical Rec #:  [id number]             Height:       57.0 in Accession #:    [id number]            Weight:       155.0 lb Date of Birth:  Feb 07, 1956             BSA:          1.614 m Patient Age:    68 years              BP:           119/98 mmHg Patient Gender: F                     HR:           82 bpm. Exam Location:  [facility] Procedure: 2D Echo (Both Spectral and Color Flow Doppler were utilized during            procedure). Indications:     stroke  History:         Patient has no prior history of Echocardiogram examinations.                  Risk Factors:Diabetes and Hypertension.  Sonographer:     Dione Franks RDCS Referring Phys:  [id number] Avi Body Diagnosing Phys: Belva Boyden MD IMPRESSIONS  1. Left ventricular ejection fraction, by estimation, is 60 to 65%. The left ventricle has normal function. The left ventricle has no regional wall motion abnormalities. Left ventricular diastolic parameters are consistent  with Grade I diastolic dysfunction (impaired relaxation).  2. Right ventricular systolic function is normal. The right ventricular size is normal.  3. The mitral valve is normal in structure. No evidence of mitral valve regurgitation. No evidence of mitral stenosis.  4. The aortic valve is normal in structure. Aortic valve regurgitation is not visualized. Aortic valve sclerosis is present, with no evidence of aortic valve stenosis.  5. The inferior vena cava is normal in size with greater than 50% respiratory variability, suggesting right atrial pressure of 3 mmHg. FINDINGS  Left Ventricle: Left ventricular ejection fraction, by estimation, is 60 to 65%. The left ventricle has normal function. The left ventricle has no regional wall motion abnormalities. Strain was performed and the global longitudinal strain is indeterminate. The left ventricular internal cavity size was normal in size. There is no left ventricular hypertrophy. Left ventricular diastolic parameters are consistent with Grade I diastolic dysfunction (impaired relaxation). Right Ventricle: The right ventricular size is normal. No increase in right ventricular wall thickness. Right ventricular systolic function is normal. Left Atrium: Left atrial size was normal in size. Right Atrium: Right atrial size was normal in size. Pericardium: There is no evidence of pericardial effusion. Mitral Valve: The mitral valve is normal in structure. No evidence of mitral valve regurgitation. No evidence of mitral valve stenosis. Tricuspid  Valve: The tricuspid valve is normal in structure. Tricuspid valve regurgitation is not demonstrated. No evidence of tricuspid stenosis. Aortic Valve: The aortic valve is normal in structure. Aortic valve regurgitation is not visualized. Aortic valve sclerosis is present, with no evidence of aortic valve stenosis. Pulmonic Valve: The pulmonic valve was normal in structure. Pulmonic valve regurgitation is not visualized. No evidence  of pulmonic stenosis. Aorta: The aortic root is normal in size and structure. Venous: The inferior vena cava is normal in size with greater than 50% respiratory variability, suggesting right atrial pressure of 3 mmHg. IAS/Shunts: No atrial level shunt detected by color flow Doppler. Additional Comments: 3D was performed not requiring image post processing on an independent workstation and was indeterminate.  LEFT VENTRICLE PLAX 2D LVIDd:         3.90 cm   Diastology LVIDs:         2.50 cm   LV e' medial:    9.46 cm/s LV PW:         1.10 cm   LV E/e' medial:  10.1 LV IVS:        1.00 cm   LV e' lateral:   10.90 cm/s LVOT diam:     1.70 cm   LV E/e' lateral: 8.8 LV SV:         49 LV SV Index:   30 LVOT Area:     2.27 cm  RIGHT VENTRICLE             IVC RV Basal diam:  2.60 cm     IVC diam: 1.70 cm RV S prime:     21.60 cm/s TAPSE (M-mode): 1.7 cm LEFT ATRIUM           Index        RIGHT ATRIUM          Index LA diam:      3.00 cm 1.86 cm/m   RA Area:     9.65 cm LA Vol (A4C): 31.8 ml 19.70 ml/m  RA Volume:   19.10 ml 11.83 ml/m  AORTIC VALVE LVOT Vmax:   119.00 cm/s LVOT Vmean:  79.900 cm/s LVOT VTI:    0.215 m  AORTA Ao Root diam: 3.00 cm Ao Asc diam:  3.20 cm MITRAL VALVE MV Area (PHT): 3.60 cm    SHUNTS MV Decel Time: 211 msec    Systemic VTI:  0.22 m MV E velocity: 95.60 cm/s  Systemic Diam: 1.70 cm MV A velocity: 97.10 cm/s MV E/A ratio:  0.98 Belva Boyden MD Electronically signed by Belva Boyden MD Signature Date/Time: 12/20/2023/6:28:29 PM    Final                LOS: 1 day   Cleto Claggett  [facility]   Pager on [email or web address]. If 7PM-7AM, please contact night-coverage at [email or web address]     12/22/2023, 3:17 PM

## 2023-12-23 DIAGNOSIS — I639 Cerebral infarction, unspecified: Secondary | ICD-10-CM | POA: Diagnosis not present

## 2023-12-23 LAB — GLUCOSE, CAPILLARY
Glucose-Capillary: 284 mg/dL — ABNORMAL HIGH (ref 70–99)
Glucose-Capillary: 262 mg/dL — ABNORMAL HIGH (ref 70–99)
Glucose-Capillary: 266 mg/dL — ABNORMAL HIGH (ref 70–99)
Glucose-Capillary: 155 mg/dL — ABNORMAL HIGH (ref 70–99)
Glucose-Capillary: 264 mg/dL — ABNORMAL HIGH (ref 70–99)

## 2023-12-23 MED ORDER — INSULIN ASPART 100 UNIT/ML IJ SOLN
0.0000 [IU] | Freq: Three times a day (TID) | INTRAMUSCULAR | Status: DC
  Administered 2023-12-23: 8 [IU] via SUBCUTANEOUS
  Administered 2023-12-23: 3 [IU] via SUBCUTANEOUS
  Administered 2023-12-24: 5 [IU] via SUBCUTANEOUS
  Administered 2023-12-24: 11 [IU] via SUBCUTANEOUS
  Filled 2023-12-23 (×4): qty 1

## 2023-12-23 MED ORDER — INSULIN GLARGINE-YFGN 100 UNIT/ML ~~LOC~~ SOLN
8.0000 [IU] | Freq: Every day | SUBCUTANEOUS | Status: DC
Start: 2023-12-23 — End: 2023-12-24
  Administered 2023-12-23: 8 [IU] via SUBCUTANEOUS
  Filled 2023-12-23 (×2): qty 0.08

## 2023-12-23 NOTE — Progress Notes (Signed)
 Physical Therapy Treatment Patient Details Name: Haley Jones MRN: [id number] DOB: 23-Nov-1955 Today's Date: 12/23/2023   History of Present Illness Pt is a 68 y.o. 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    PT Comments  Pt in bed, ready for session. She is able to get to EOB with min/mod a x 1 with cues for hand placement, sequencing and to slow down for movements as she tries to get up without thinking or listening to cues making it more challenging.  Upon sitting she is initially unsteady with cues to correct posture.  Time spent for ROM RUE in sitting.  Stands to RW with min/mod a x 1.  Pulls up on walker with L hand and R hand on lower bar when left to try on her own.  Cues to place R hand on walker grip and not on lower bar and she does have trouble finding black hand grip and does seem to be somewhat frustrated when cued.  She stands several times with emphasis on balance and body awareness.  With legs resting on bed for support she is able to stand with light min a but when stepping forward to remove support, post balance deficits are more evident.  Standing ex with walker support and min/mod a x 1.  Despite balance deficits and increased assist from writer to remain upright she does continue to march or SLR without stopping to correct even with cues.  She is able to transfer to recliner at bedside with min a x 1.  Discussed with nurse tech and encouraged to have +2 for assist for mobility due to balance, safety and impulsivity at times.   If plan is discharge home, recommend the following: A lot of help with walking and/or transfers;A little help with bathing/dressing/bathroom;Assistance with cooking/housework;Supervision due to cognitive status;Assist for transportation;Help with stairs or ramp for entrance;Direct  supervision/assist for medications management   Can travel by private vehicle        Equipment Recommendations       Recommendations for Other Services       Precautions / Restrictions Precautions Precautions: Fall Recall of Precautions/Restrictions: Impaired Restrictions Weight Bearing Restrictions Per Provider Order: No     Mobility  Bed Mobility Overal bed mobility: Needs Assistance Bed Mobility: Supine to Sit, Sit to Supine     Supine to sit: Min assist, HOB elevated Sit to supine: Min assist, Used rails   General bed mobility comments: cues for hand placemetns, sequencing and being attentive to R arm Patient Response: Cooperative  Transfers Overall transfer level: Needs assistance Equipment used: Rolling walker (2 wheels) Transfers: Sit to/from Stand Sit to Stand: Min assist           General transfer comment: cues for    Ambulation/Gait Ambulation/Gait assistance: Min assist, Mod assist Gait Distance (Feet): 3 Feet Assistive device: Rolling walker (2 wheels) Gait Pattern/deviations: Step-to pattern, Narrow base of support Gait velocity: decreased     General Gait Details: session focued on balance and spacial awareness today   Stairs             Wheelchair Mobility     Tilt Bed Tilt Bed Patient Response: Cooperative  Modified Rankin (Stroke Patients Only)       Balance Overall balance assessment: Needs assistance Sitting-balance support: Feet unsupported Sitting balance-Leahy Scale:  Fair Sitting balance - Comments: cues to sit upright with occasional right lean   Standing balance support: Bilateral upper extremity supported, During functional activity, Single extremity supported Standing balance-Leahy Scale: Poor Standing balance comment: High fall risk                            Communication Communication Communication: No apparent difficulties  Cognition Arousal: Alert Behavior During Therapy: Impulsive, WFL  for tasks assessed/performed, Lability                             Following commands: Intact, Impaired Following commands impaired: Follows one step commands inconsistently, Follows one step commands with increased time    Cueing Cueing Techniques: Verbal cues, Gestural cues, Tactile cues  Exercises Other Exercises Other Exercises: standing and sitting ex    General Comments        Pertinent Vitals/Pain Pain Assessment Pain Assessment: No/denies pain    Home Living                          Prior Function            PT Goals (current goals can now be found in the care plan section) Progress towards PT goals: Progressing toward goals    Frequency    Min 3X/week      PT Plan      Co-evaluation              AM-PAC PT "6 Clicks" Mobility   Outcome Measure  Help needed turning from your back to your side while in a flat bed without using bedrails?: A Little Help needed moving from lying on your back to sitting on the side of a flat bed without using bedrails?: A Lot Help needed moving to and from a bed to a chair (including a wheelchair)?: A Lot Help needed standing up from a chair using your arms (e.g., wheelchair or bedside chair)?: A Little Help needed to walk in hospital room?: A Lot Help needed climbing 3-5 steps with a railing? : A Lot 6 Click Score: 14    End of Session Equipment Utilized During Treatment: Gait belt Activity Tolerance: Patient tolerated treatment well Patient left: in chair;with call bell/phone within reach;with chair alarm set Nurse Communication: Mobility status PT Visit Diagnosis: Other abnormalities of gait and mobility (R26.89);Difficulty in walking, not elsewhere classified (R26.2);Muscle weakness (generalized) (M62.81)     Time: 9562-1308 PT Time Calculation (min) (ACUTE ONLY): 18 min  Charges:    $Therapeutic Exercise: 8-22 mins PT General Charges $$ ACUTE PT VISIT: 1 Visit                    Charlanne Cong, PTA 12/23/23, 1:01 PM

## 2023-12-23 NOTE — Progress Notes (Signed)
 Progress Note    Haley Jones  ZOX:[id number] DOB: Jun 07, 1956  DOA: 12/20/2023 PCP: Sari Cunning, MD      Brief Narrative:    Medical records reviewed and are as summarized below:  Haley Jones is a 68 y.o. female with medical history significant of Hypertension, B12 deficiency, type 2 diabetes, hyperlipidemia, CKD stage IIIa, who presented to the hospital with right-sided weakness.  She also reported a 2 to 3-week history of dizziness and she has been falling more often.  She reported this to her PCP and she was started on meclizine.  Her sister came to visit her a day prior to admission and she noticed that she was not moving her right hand well.  Patient could not tell exactly when the symptoms started.  Patient is a primary caregiver for her disabled 27 year old daughter at home.   She was admitted to the hospital for left MCA stroke.     Assessment/Plan:   Principal Problem:   Acute CVA (cerebrovascular accident) [facility]) Active Problems:   Acute kidney injury superimposed on chronic kidney disease (HCC)   B12 deficiency   DM type 2 with diabetic mixed hyperlipidemia (HCC)   Primary hypertension    Acute left MCA stroke: Neurologist recommended dual antiplatelet therapy with low-dose aspirin  and Plavix for 90 days followed by Plavix monotherapy.  Continue Crestor . 2D echo showed EF estimated at 60 to 65%, grade 1 diastolic dysfunction.  No interatrial shunt. PT recommended rehab at the inpatient rehab center. Follow-up with physiatrist and TOC to assist with disposition.   AKI on CKD stage IIIa: Improved.  Creatinine down from 1.61-1.10.   Hypokalemia: Improved   Hypertension: BP is optimal.  Continue lisinopril  and amlodipine    Type II DM with hyperglycemia: Start Lantus at 7 units daily.  Increase sliding scale from mild to moderate scale. Dulaglutide , metformin, glimepiride , glipizide and Jardiance have been held Hemoglobin A1c was 8.4  on 11/26/2023   Hyperlipidemia: Triglycerides 154, total cholesterol 170, HDL 59 LDL 80.  Continue Crestor    Vitamin B12 deficiency: She said she prefers to take the injections instead of pills.  Patient said she got a vitamin B12 shot at her doctor's office.  She has been advised to follow-up with PCP for regular vitamin B12 injections. Vitamin B12 was 175 on 11/26/2023    Comorbidities include chronic back pain, anxiety, depression, chronic sinusitis    Diet Order             Diet Carb Modified Fluid consistency: Thin  Diet effective now                            Consultants: Neurologist  Procedures: None    Medications:    amLODipine   5 mg Oral Daily   aspirin  EC  81 mg Oral Daily   clonazePAM   1 mg Oral QHS   clopidogrel  75 mg Oral Daily   enoxaparin  (LOVENOX ) injection  40 mg Subcutaneous Q24H   feeding supplement  237 mL Oral BID BM   insulin  aspart  0-15 Units Subcutaneous TID WC   insulin  glargine-yfgn  8 Units Subcutaneous Daily   lisinopril   10 mg Oral Daily   pantoprazole   40 mg Oral QAC breakfast   pneumococcal 20-valent conjugate vaccine  0.5 mL Intramuscular Tomorrow-1000   rosuvastatin   20 mg Oral Daily   venlafaxine  XR  37.5 mg Oral Daily   Continuous Infusions:  Anti-infectives (From admission, onward)    None              Family Communication/Anticipated D/C date and plan/Code Status   DVT prophylaxis: enoxaparin  (LOVENOX ) injection 40 mg Start: 12/20/23 2200     Code Status: Full Code  Family Communication: None Disposition Plan: Plan to discharge to inpatient rehab   Status is: Inpatient Remains inpatient appropriate because: Acute stroke         Subjective:   Interval events noted.  She has no complaints.  She feels a little better but she can still tell that her right side is weak.  Objective:    Vitals:   12/23/23 0021 12/23/23 0431 12/23/23 0739 12/23/23 1139  BP: 101/68 95/73 110/69  121/77  Pulse: 90 92 86 88  Resp: 18 16 16 16   Temp: 98.2 F (36.8 C) 98 F (36.7 C) 97.7 F (36.5 C) 97.7 F (36.5 C)  TempSrc:  Oral    SpO2: 100% 99% 100% 95%   No data found.   Intake/Output Summary (Last 24 hours) at 12/23/2023 1434 Last data filed at 12/23/2023 1100 Gross per 24 hour  Intake 240 ml  Output 800 ml  Net -560 ml   There were no vitals filed for this visit.  Exam:    GEN: NAD SKIN: Warm and dry EYES: No pallor or icterus ENT: MMM CV: RRR PULM: CTA B ABD: soft, ND, NT, +BS CNS: AAO x 3, mild right-sided hemiparesis EXT: No edema or tenderness      Data Reviewed:   I have personally reviewed following labs and imaging studies:  Labs: Labs show the following:   Basic Metabolic Panel: Recent Labs  Lab 12/20/23 0909 12/21/23 0503 12/21/23 0505 12/22/23 0643  NA 135  --  138 136  K 3.5  --  3.1* 3.6  CL 102  --  104 105  CO2 19*  --  21* 23  GLUCOSE 262*  --  161* 189*  BUN 19  --  16 17  CREATININE 1.61*  --  1.10* 1.19*  CALCIUM  9.2  --  8.7* 8.7*  MG  --  1.8  --   --    GFR CrCl cannot be calculated (Unknown ideal weight.). Liver Function Tests: Recent Labs  Lab 12/20/23 0909  AST 23  ALT 23  ALKPHOS 77  BILITOT 1.2  PROT 7.3  ALBUMIN 3.7   No results for input(s): "LIPASE", "AMYLASE" in the last 168 hours. No results for input(s): "AMMONIA" in the last 168 hours. Coagulation profile No results for input(s): "INR", "PROTIME" in the last 168 hours.  CBC: Recent Labs  Lab 12/20/23 0909 12/21/23 0505  WBC 7.6 5.0  NEUTROABS 5.7 2.8  HGB 13.4 12.8  HCT 38.2 36.0  MCV 91.0 89.6  PLT 220 183   Cardiac Enzymes: No results for input(s): "CKTOTAL", "CKMB", "CKMBINDEX", "TROPONINI" in the last 168 hours. BNP (last 3 results) No results for input(s): "PROBNP" in the last 8760 hours. CBG: Recent Labs  Lab 12/22/23 1708 12/22/23 1957 12/23/23 0433 12/23/23 0747 12/23/23 1141  GLUCAP 167* 339* 284* 262* 266*    D-Dimer: No results for input(s): "DDIMER" in the last 72 hours. Hgb A1c: No results for input(s): "HGBA1C" in the last 72 hours. Lipid Profile: No results for input(s): "CHOL", "HDL", "LDLCALC", "TRIG", "CHOLHDL", "LDLDIRECT" in the last 72 hours.  Thyroid function studies: No results for input(s): "TSH", "T4TOTAL", "T3FREE", "THYROIDAB" in the last 72 hours.  Invalid input(s): "FREET3"  Anemia work up: No results for input(s): "VITAMINB12", "FOLATE", "FERRITIN", "TIBC", "IRON", "RETICCTPCT" in the last 72 hours. Sepsis Labs: Recent Labs  Lab 12/20/23 0909 12/21/23 0505  WBC 7.6 5.0    Microbiology No results found for this or any previous visit (from the past 240 hours).  Procedures and diagnostic studies:  No results found.              LOS: 2 days   David Towson  [facility]   Pager on [email or web address]. If 7PM-7AM, please contact night-coverage at [email or web address]     12/23/2023, 2:34 PM

## 2023-12-24 DIAGNOSIS — I639 Cerebral infarction, unspecified: Secondary | ICD-10-CM | POA: Diagnosis not present

## 2023-12-24 LAB — GLUCOSE, CAPILLARY
Glucose-Capillary: 236 mg/dL — ABNORMAL HIGH (ref 70–99)
Glucose-Capillary: 215 mg/dL — ABNORMAL HIGH (ref 70–99)
Glucose-Capillary: 309 mg/dL — ABNORMAL HIGH (ref 70–99)
Glucose-Capillary: 159 mg/dL — ABNORMAL HIGH (ref 70–99)
Glucose-Capillary: 287 mg/dL — ABNORMAL HIGH (ref 70–99)

## 2023-12-24 MED ORDER — INSULIN ASPART 100 UNIT/ML IJ SOLN
0.0000 [IU] | Freq: Three times a day (TID) | INTRAMUSCULAR | Status: DC
  Administered 2023-12-24: 3 [IU] via SUBCUTANEOUS
  Administered 2023-12-25: 8 [IU] via SUBCUTANEOUS
  Administered 2023-12-25: 3 [IU] via SUBCUTANEOUS
  Administered 2023-12-25 – 2023-12-26 (×2): 5 [IU] via SUBCUTANEOUS
  Administered 2023-12-26 – 2023-12-27 (×3): 3 [IU] via SUBCUTANEOUS
  Administered 2023-12-27 – 2023-12-28 (×2): 5 [IU] via SUBCUTANEOUS
  Filled 2023-12-24 (×10): qty 1

## 2023-12-24 MED ORDER — INSULIN ASPART 100 UNIT/ML IJ SOLN
3.0000 [IU] | Freq: Three times a day (TID) | INTRAMUSCULAR | Status: DC
  Administered 2023-12-24 – 2023-12-25 (×3): 3 [IU] via SUBCUTANEOUS
  Filled 2023-12-24 (×3): qty 1

## 2023-12-24 MED ORDER — INSULIN GLARGINE-YFGN 100 UNIT/ML ~~LOC~~ SOLN
10.0000 [IU] | Freq: Every day | SUBCUTANEOUS | Status: DC
  Administered 2023-12-24 – 2023-12-28 (×5): 10 [IU] via SUBCUTANEOUS
  Filled 2023-12-24 (×6): qty 0.1

## 2023-12-24 MED ORDER — INSULIN ASPART 100 UNIT/ML IJ SOLN
0.0000 [IU] | Freq: Every day | INTRAMUSCULAR | Status: DC
  Administered 2023-12-24 – 2023-12-27 (×2): 3 [IU] via SUBCUTANEOUS
  Filled 2023-12-24 (×2): qty 1

## 2023-12-24 NOTE — Progress Notes (Signed)
 Physical Therapy Treatment Patient Details Name: Haley Jones MRN: [id number] DOB: 1956/05/22 Today's Date: 12/24/2023   History of Present Illness Pt is a 68 y.o. 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    PT Comments  Pt sitting in chair upon arrival, sister at bedside. Discussed goals and hopeful plan to transition to CIR once medically cleared. R UE neglect remains with limited active ROM and poor motor control. R LE grossly 4-/5 strength throughout with motor control and proprioception deficits during functional activity. Pt currently able to complete ~36ft of gait training with heavy reliance on RW for balance requiring Min to ModA for safety. Repeated multimodal cues to complete tasks. Pt remains an excellent CIR candidate once medically cleared for d/c.   If plan is discharge home, recommend the following: A lot of help with walking and/or transfers;A little help with bathing/dressing/bathroom;Assistance with cooking/housework;Supervision due to cognitive status;Assist for transportation;Help with stairs or ramp for entrance;Direct supervision/assist for medications management   Can travel by private vehicle        Equipment Recommendations  Other (comment) (Defer to next level of care)    Recommendations for Other Services       Precautions / Restrictions Precautions Precautions: Fall Recall of Precautions/Restrictions: Impaired Precaution/Restrictions Comments: Decreased safety awareness Restrictions Weight Bearing Restrictions Per Provider Order: No     Mobility  Bed Mobility Overal bed mobility: Needs Assistance Bed Mobility: Supine to Sit, Sit to Supine     Supine to sit: Min assist, HOB elevated Sit to supine: Min assist, Used rails   General bed mobility comments: cues for hand placemetns,  sequencing and being attentive to R arm    Transfers Overall transfer level: Needs assistance Equipment used: Rolling walker (2 wheels) Transfers: Sit to/from Stand Sit to Stand: Min assist           General transfer comment: cues for proper technique and hand placement    Ambulation/Gait Ambulation/Gait assistance: Min assist Gait Distance (Feet): 50 Feet Assistive device: Rolling walker (2 wheels) Gait Pattern/deviations: Step-to pattern, Decreased stride length, Decreased dorsiflexion - right, Decreased step length - right, Drifts right/left Gait velocity: decreased     General Gait Details: High fall risk, unsteady with changes in direction and navigating around objects   Stairs             Wheelchair Mobility     Tilt Bed    Modified Rankin (Stroke Patients Only)       Balance Overall balance assessment: Needs assistance Sitting-balance support: Feet unsupported Sitting balance-Leahy Scale: Fair Sitting balance - Comments: cues to sit upright with occasional right lean   Standing balance support: Bilateral upper extremity supported, During functional activity, Single extremity supported Standing balance-Leahy Scale: Poor Standing balance comment: High fall risk                            Communication Communication Communication: No apparent difficulties  Cognition Arousal: Alert Behavior During Therapy: Impulsive, WFL for tasks assessed/performed, Lability   PT - Cognitive impairments: History of cognitive impairments                       PT - Cognition Comments:  (Pt is alert and cooperative. She does have mild cognitive deficits at  baseline) Following commands: Intact, Impaired Following commands impaired: Follows one step commands inconsistently, Follows one step commands with increased time    Cueing Cueing Techniques: Verbal cues, Gestural cues, Tactile cues  Exercises General Exercises - Lower Extremity Ankle  Circles/Pumps: AROM, Both, 10 reps Long Arc Quad: AROM, Both, 10 reps Hip Flexion/Marching: AROM, Both, 5 reps Other Exercises Other Exercises: standing and sitting ex    General Comments General comments (skin integrity, edema, etc.):  (Pt and pt's sister educated on role of PT and CIRrecs.)      Pertinent Vitals/Pain Pain Assessment Pain Assessment: No/denies pain    Home Living                          Prior Function            PT Goals (current goals can now be found in the care plan section) Acute Rehab PT Goals Patient Stated Goal: to feel better Progress towards PT goals: Progressing toward goals    Frequency    Min 3X/week      PT Plan      Co-evaluation              AM-PAC PT "6 Clicks" Mobility   Outcome Measure  Help needed turning from your back to your side while in a flat bed without using bedrails?: A Little Help needed moving from lying on your back to sitting on the side of a flat bed without using bedrails?: A Lot Help needed moving to and from a bed to a chair (including a wheelchair)?: A Lot Help needed standing up from a chair using your arms (e.g., wheelchair or bedside chair)?: A Little Help needed to walk in hospital room?: A Lot Help needed climbing 3-5 steps with a railing? : A Lot 6 Click Score: 14    End of Session Equipment Utilized During Treatment: Gait belt Activity Tolerance: Patient tolerated treatment well Patient left: in chair;with call bell/phone within reach;with chair alarm set;with family/visitor present Nurse Communication: Mobility status PT Visit Diagnosis: Other abnormalities of gait and mobility (R26.89);Difficulty in walking, not elsewhere classified (R26.2);Muscle weakness (generalized) (M62.81)     Time: 1610-9604 PT Time Calculation (min) (ACUTE ONLY): 24 min  Charges:    $Gait Training: 8-22 mins $Therapeutic Exercise: 8-22 mins PT General Charges $$ ACUTE PT VISIT: 1 Visit                     [name], PTA  Haley Jones 12/24/2023, 1:56 PM

## 2023-12-24 NOTE — Progress Notes (Signed)
 Occupational Therapy Treatment Patient Details Name: Haley Jones MRN: [id number] DOB: 03/21/1956 Today's Date: 12/24/2023   History of present illness Pt is a 68 y.o. 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   OT comments  Pt seen for OT treatment on this date. Upon arrival to room pt resting in bed, agreeable to tx. Pt requires step by step verbal/tactile cues for getting to the EOB, very confused due to R sided deficits, easily frustrated with self. Pt requires frequent cues for attending to R side of RW, often bumping into thing, unable to navigate without tactile cue. Pt amb into bathroom with MODA, less physical assistance and more DME management assistance with RW. Pt requires placement of R UE on surfaces to provide support, cues throughout to attend to the R side. Pt retired in room with all needs in reach. Pt making good progress toward goals, will continue to follow POC. Discharge recommendation remains appropriate.        If plan is discharge home, recommend the following:  A little help with walking and/or transfers;A little help with bathing/dressing/bathroom;Assistance with cooking/housework;Direct supervision/assist for medications management;Assist for transportation;Help with stairs or ramp for entrance   Equipment Recommendations  Other (comment)    Recommendations for Other Services      Precautions / Restrictions Precautions Precautions: Fall Recall of Precautions/Restrictions: Impaired Precaution/Restrictions Comments: Decreased safety awareness Restrictions Weight Bearing Restrictions Per Provider Order: No       Mobility Bed Mobility Overal bed mobility: Needs Assistance Bed Mobility: Supine to Sit, Sit to Supine     Supine to sit: Min assist, HOB elevated Sit to supine: Min assist, Used  rails   General bed mobility comments: Step by step verbal/tactile cues for getting to the EOB, very confused due to R sided deficits    Transfers Overall transfer level: Needs assistance Equipment used: Rolling walker (2 wheels) Transfers: Sit to/from Stand, Bed to chair/wheelchair/BSC Sit to Stand: Min assist           General transfer comment: Frequent cues for attending to R side of RW, often bumping into thing, unable to navigate without tactile cue.     Balance Overall balance assessment: Needs assistance Sitting-balance support: Feet supported Sitting balance-Leahy Scale: Fair Sitting balance - Comments: Cues to use UEs for self supporting Postural control: Posterior lean Standing balance support: Bilateral upper extremity supported, During functional activity, Single extremity supported Standing balance-Leahy Scale: Poor Standing balance comment: High fall risk                           ADL either performed or assessed with clinical judgement   ADL Overall ADL's : Needs assistance/impaired Eating/Feeding: Independent;Sitting   Grooming: Wash/dry face;Wash/dry hands;Bed level;Independent Grooming Details (indicate cue type and reason): Cues for attention to task             Lower Body Dressing: Maximal assistance;Sit to/from stand   Toilet Transfer: Minimal assistance;Moderate assistance;Ambulation;Rolling walker (2 wheels);Cueing for sequencing;Cueing for safety Toilet Transfer Details (indicate cue type and reason): Heavy cues for RW management Toileting- Clothing Manipulation and Hygiene: Contact guard assist;Sitting/lateral lean       Functional mobility during ADLs: Moderate assistance;Rolling walker (2 wheels);Cueing for sequencing;Cueing for safety General ADL Comments: Toilet t/f MODA , DME management due to  R sided deficits, CGA pericare    Communication Communication Communication: No apparent difficulties   Cognition Arousal:  Alert Behavior During Therapy: Impulsive, WFL for tasks assessed/performed Cognition: Cognition impaired     Awareness: Intellectual awareness impaired, Online awareness impaired   Attention impairment (select first level of impairment): Sustained attention Executive functioning impairment (select all impairments): Initiation, Organization, Sequencing, Reasoning, Problem solving OT - Cognition Comments: Easily frustrated with self                 Following commands: Impaired Following commands impaired: Follows one step commands inconsistently      Cueing   Cueing Techniques: Verbal cues, Gestural cues, Tactile cues  Exercises Exercises: Other exercises Other Exercises Other Exercises: Edu: safe ADL completion, scanning enviroment during mobility to prevent bumping into things    Shoulder Instructions       General Comments  (Pt and pt's sister educated on role of PT and CIRrecs.)    Pertinent Vitals/ Pain       Pain Assessment Pain Assessment: No/denies pain  Home Living                                          Prior Functioning/Environment              Frequency  Min 3X/week        Progress Toward Goals  OT Goals(current goals can now be found in the care plan section)  Progress towards OT goals: Progressing toward goals  Acute Rehab OT Goals OT Goal Formulation: With patient Time For Goal Achievement: 01/04/24 Potential to Achieve Goals: Good ADL Goals Pt Will Perform Lower Body Dressing: sit to/from stand;with supervision Pt Will Transfer to Toilet: ambulating;with supervision;bedside commode;regular height toilet Pt Will Perform Toileting - Clothing Manipulation and hygiene: with modified independence;sitting/lateral leans;sit to/from stand Additional ADL Goal #1: Pt will complete ADL tasks requiring MIN VC for R side attention/involvement, sequencing, and safety, 10/10 opportunities.  Plan      Co-evaluation                  AM-PAC OT "6 Clicks" Daily Activity     Outcome Measure   Help from another person eating meals?: None Help from another person taking care of personal grooming?: A Little Help from another person toileting, which includes using toliet, bedpan, or urinal?: A Little Help from another person bathing (including washing, rinsing, drying)?: A Little Help from another person to put on and taking off regular upper body clothing?: A Little Help from another person to put on and taking off regular lower body clothing?: A Little 6 Click Score: 19    End of Session Equipment Utilized During Treatment: Gait belt;Rolling walker (2 wheels)  OT Visit Diagnosis: Other symptoms and signs involving cognitive function;Hemiplegia and hemiparesis;Low vision, both eyes (H54.2);History of falling (Z91.81) Hemiplegia - Right/Left: Right Hemiplegia - dominant/non-dominant: Non-Dominant Hemiplegia - caused by: Cerebral infarction   Activity Tolerance Patient tolerated treatment well   Patient Left in bed;with call bell/phone within reach;with bed alarm set   Nurse Communication Mobility status        Time: 4098-1191 OT Time Calculation (min): 21 min  Charges: OT General Charges $OT Visit: 1 Visit OT Treatments $Therapeutic Activity: 8-22 mins  Rosaria Common M.S. OTR/L  12/24/23, 3:54 PM

## 2023-12-24 NOTE — Progress Notes (Addendum)
 Speech Language Pathology Treatment: Cognitive-Linquistic  Patient Details Name: Haley Jones MRN: [id number] DOB: Feb 05, 1956 Today's Date: 12/24/2023 Time: 7253-6644 SLP Time Calculation (min) (ACUTE ONLY): 40 min  Assessment / Plan / Recommendation Clinical Impression  Pt seen today for discussion re: cognitive-communication strategies at bedside. Pt awake/alert, verbal and talkative, min distracted at times. She followed instructions appropriately w/ cue. Appropriate engagement during session. Unsure of pt's Baseline Cognitive functioning. Noted MRI results; some R side neglect.  On RA; afebrile. WBC WNL.   Pt presents with Mild deficits in cognitive-communication abilities/skills per informal assessment at bedside, though functional verbal communication in known conversation. Impairments characterized by deficits in the areas of lengthy attention, memory recall, and mental flexibility which also seemed to impact Auditory Comprehension during certain tasks. 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 Unknown if any h/o cognitive deficits- see MRI results.    During session, discussed w/ pt strategies should could implement during conversation if needing clarification of a topic. She was able to identify basic problem situations and indicated need for "911" contact during an "emergency" situation. W/ cue, she identified potential concerns then solutions in problem-solving situations in ADLs. Verbal complex reasoning tasks(including Medications) completed addressing attention to tasks. Min+ cues increased accuracy and awareness during tasks.    Recommended pt and Sister f/u w/ Pharmacy or NSG re: any medication questions.    Suspect pt could have new deficits in cognitive-communication abilities and would best benefit from f/u at next venue of care w/ further,  more formal, assessment of communication abilities in her ADLs. F/u for intervention could be beneficial re: assessing cognitive-communication performance in ADLs in a more natural environment to best ensure Safety in her D/C environment. No further Acute skilled needs indicated. Sister present agreed. Recommend further Acute ST services in the Inpatient vs Outpatient/Rehab setting to address any further assessment and tx needs in order to promote pt independence and safety in her D/C environment. Pt in agreement w/ plan. Team updated.      HPI HPI: Pt is a 68 y.o. 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 She did report she has several family members that live around her, or live in town.    MRI: Patchy acute cortical/subcortical infarcts within the left  parietal and occipital lobes individually measuring up to 3 cm (MCA  vascular territory). Subtle petechial hemorrhage associated with  some of these infarcts. No frank hemorrhagic conversion.  2. Additional punctate left MCA territory cortical acute infarct  within the left insula.  3. Background parenchymal Atrophy and Chronic small vessel ischemic  disease with Chronic lacunar infarcts, as described.      SLP Plan  Continue with current plan of care      Recommendations for follow up therapy are one component of a multi-disciplinary discharge planning process, led by the attending physician.  Recommendations may be updated based on patient status, additional functional criteria and insurance authorization.    Recommendations   F/u at next venue of care to address any id'd needs                Rehab consult (  post D/C) Oral care BID;Patient independent with oral care   Intermittent  Supervision/Assistance Cognitive communication deficit (N56.213)     Continue with current plan of care        Darla Edward, MS, CCC-SLP Speech Language Pathologist Rehab Services; [facility] [phone] (ascom) [place]  12/24/2023, 3:43 PM

## 2023-12-24 NOTE — Progress Notes (Signed)
   12/24/23 1000  Spiritual Encounters  Type of Visit Initial  Care provided to: Pt and family  Conversation partners present during encounter Nurse  Reason for visit Advance directives  OnCall Visit No   Chaplain visited Unit to assist Unit Chaplain.  PRN Chaplain shared there was a request for an AD.  PRN Chaplain and a staff member/relative of patient shared patient also lost her daughter since she's been hospitalized.  Patient's sister will be HCPOA.  Chaplain explained form and process.  Chaplain also offered prayer regarding patient's grief and desire that insurance cover expenses at next facility.  Chaplain shared visit with Unit Chaplain and Unit Secretary.  Rev. Rana M. Nolon Baxter, M.Div. Chaplain Resident [facility], [place]

## 2023-12-24 NOTE — Progress Notes (Signed)
 Inpatient Rehab Admissions Coordinator:    I sent case to insurance for review. I await a decision for CIR.   Wandalee Gust, MS, CCC-SLP Rehab Admissions Coordinator  [phone] (celll) [phone] (office)

## 2023-12-24 NOTE — Plan of Care (Signed)
  Problem: Education: Goal: Knowledge of disease or condition will improve Outcome: Progressing Goal: Knowledge of secondary prevention will improve (MUST DOCUMENT ALL) Outcome: Progressing Goal: Knowledge of patient specific risk factors will improve (DELETE if not current risk factor) Outcome: Progressing   Problem: Ischemic Stroke/TIA Tissue Perfusion: Goal: Complications of ischemic stroke/TIA will be minimized Outcome: Progressing   Problem: Coping: Goal: Will verbalize positive feelings about self Outcome: Progressing Goal: Will identify appropriate support needs Outcome: Progressing   Problem: Health Behavior/Discharge Planning: Goal: Ability to manage health-related needs will improve Outcome: Progressing Goal: Goals will be collaboratively established with patient/family Outcome: Progressing   Problem: Self-Care: Goal: Ability to participate in self-care as condition permits will improve Outcome: Progressing

## 2023-12-24 NOTE — Inpatient Diabetes Management (Signed)
 Inpatient Diabetes Program Recommendations  AACE/ADA: New Consensus Statement on Inpatient Glycemic Control   Target Ranges:  Prepandial:   less than 140 mg/dL      Peak postprandial:   less than 180 mg/dL (1-2 hours)      Critically ill patients:  140 - 180 mg/dL    Latest Reference Range & Units 12/23/23 07:47 12/23/23 11:41 12/23/23 16:03 12/23/23 19:50 12/24/23 04:41 12/24/23 07:36 12/24/23 11:59  Glucose-Capillary 70 - 99 mg/dL 161 (H) 096 (H) 045 (H) 264 (H) 236 (H) 215 (H) 309 (H)   Review of Glycemic Control  Diabetes history: DM2 Outpatient Diabetes medications: Glipizide XL 10 mg daily, Amaryl  3 mg QAM, Jardiance 10 mg daily, Metformin 500 mg QAM, Metformin 1000 mg QPM Current orders for Inpatient glycemic control: Semglee 10 units daily, Novolog  0-15 units TID with meals  Inpatient Diabetes Program Recommendations:    Insulin : Please consider ordering Novolog  0-5 units at bedtime for bedtime correction and adding Novolog  4 units TID with meals for meal coverage if patient eats at least 50% of meals.  Thanks, Beacher Limerick, RN, MSN, CDCES Diabetes Coordinator Inpatient Diabetes Program [phone] (Team Pager from 8am to 5pm)

## 2023-12-24 NOTE — Care Management Important Message (Signed)
 Important Message  Patient Details  Name: Haley Jones MRN: [id number] Date of Birth: February 15, 1956   Important Message Given:  Yes - Medicare IM     Rivaan Kendall W, CMA 12/24/2023, 11:42 AM

## 2023-12-24 NOTE — Progress Notes (Addendum)
 Progress Note    Haley Jones  RUE:[id number] DOB: 08/01/1956  DOA: 12/20/2023 PCP: Sari Cunning, MD      Brief Narrative:    Medical records reviewed and are as summarized below:  Haley Jones is a 68 y.o. female with medical history significant of Hypertension, B12 deficiency, type 2 diabetes, hyperlipidemia, CKD stage IIIa, who presented to the hospital with right-sided weakness.  She also reported a 2 to 3-week history of dizziness and she has been falling more often.  She reported this to her PCP and she was started on meclizine.  Her sister came to visit her a day prior to admission and she noticed that she was not moving her right hand well.  Patient could not tell exactly when the symptoms started.  Patient is a primary caregiver for her disabled 52 year old daughter at home.   She was admitted to the hospital for left MCA stroke.     Assessment/Plan:   Principal Problem:   Acute CVA (cerebrovascular accident) [facility]) Active Problems:   Acute kidney injury superimposed on chronic kidney disease (HCC)   B12 deficiency   DM type 2 with diabetic mixed hyperlipidemia (HCC)   Primary hypertension    Acute left MCA stroke: Neurologist recommended dual antiplatelet therapy with low-dose aspirin  and Plavix for 90 days followed by Plavix monotherapy.  Continue Crestor . 2D echo showed EF estimated at 60 to 65%, grade 1 diastolic dysfunction.  No interatrial shunt. PT recommended rehab at the inpatient rehab center. Follow-up with physiatrist and TOC to assist with disposition.   AKI on CKD stage IIIa: Improved.  Creatinine down from 1.61-1.10.   Hypokalemia: Improved   Hypertension: BP is optimal.  Continue lisinopril  and amlodipine    Type II DM with hyperglycemia: Increase Lantus to 20 units daily.  Add NovoLog  3 units 3 times daily with meals.  Continue sliding scale insulin . Dulaglutide , metformin, glimepiride , glipizide and Jardiance have been  held Hemoglobin A1c was 8.4 on 11/26/2023   Hyperlipidemia: Triglycerides 154, total cholesterol 170, HDL 59 LDL 80.  Continue Crestor    Vitamin B12 deficiency: She said she prefers to take the injections instead of pills.  Patient said she got a vitamin B12 shot at her doctor's office.  She has been advised to follow-up with PCP for regular vitamin B12 injections. Vitamin B12 was 175 on 11/26/2023    Comorbidities include chronic back pain, anxiety, depression, chronic sinusitis  I did a peer to peer review for insurance authorization. Case was discussed with Dr. Maryjane Snider, insurance medical director.  Insurance authorization to go to [facility] inpatient rehab center was denied.  She thinks patient may be approved to go to SNF. Patient and TOC have been updated.   Diet Order             Diet Carb Modified Fluid consistency: Thin  Diet effective now                            Consultants: Neurologist  Procedures: None    Medications:    amLODipine   5 mg Oral Daily   aspirin  EC  81 mg Oral Daily   clonazePAM   1 mg Oral QHS   clopidogrel  75 mg Oral Daily   enoxaparin  (LOVENOX ) injection  40 mg Subcutaneous Q24H   feeding supplement  237 mL Oral BID BM   insulin  aspart  0-15 Units Subcutaneous TID WC   insulin  aspart  0-5  Units Subcutaneous QHS   insulin  aspart  3 Units Subcutaneous TID WC   insulin  glargine-yfgn  10 Units Subcutaneous Daily   lisinopril   10 mg Oral Daily   pantoprazole   40 mg Oral QAC breakfast   pneumococcal 20-valent conjugate vaccine  0.5 mL Intramuscular Tomorrow-1000   rosuvastatin   20 mg Oral Daily   venlafaxine  XR  37.5 mg Oral Daily   Continuous Infusions:   Anti-infectives (From admission, onward)    None              Family Communication/Anticipated D/C date and plan/Code Status   DVT prophylaxis: enoxaparin  (LOVENOX ) injection 40 mg Start: 12/20/23 2200     Code Status: Full Code  Family Communication: Plan  discussed with [name] (sister) and Jearlean Mince (relative) at the bedside Disposition Plan: Plan to discharge to inpatient rehab   Status is: Inpatient Remains inpatient appropriate because: Acute stroke         Subjective:   No acute events overnight.  She has no complaints.  [name], sister, also at the bedside. Courtney(a staff member and a relative that patient describes as "niece-in-law"") was also at the bedside.  Objective:    Vitals:   12/24/23 0011 12/24/23 0439 12/24/23 0700 12/24/23 1447  BP: 114/71 104/82 123/83 105/80  Pulse: 82 75 70 88  Resp: 16 18  16   Temp: 98.4 F (36.9 C) 97.9 F (36.6 C) 98 F (36.7 C) 98.1 F (36.7 C)  TempSrc:   Oral   SpO2: 99% 99% 99% 97%   No data found.   Intake/Output Summary (Last 24 hours) at 12/24/2023 1555 Last data filed at 12/24/2023 0700 Gross per 24 hour  Intake 240 ml  Output 2000 ml  Net -1760 ml   There were no vitals filed for this visit.  Exam:    GEN: NAD SKIN: Warm and dry EYES: EOMI, PERRLA, right visual field cut ENT: MMM CV: RRR PULM: CTA B ABD: soft, ND, NT, +BS CNS: AAO x 3, mild right hemiparesis EXT: No edema or tenderness       Data Reviewed:   I have personally reviewed following labs and imaging studies:  Labs: Labs show the following:   Basic Metabolic Panel: Recent Labs  Lab 12/20/23 0909 12/21/23 0503 12/21/23 0505 12/22/23 0643  NA 135  --  138 136  K 3.5  --  3.1* 3.6  CL 102  --  104 105  CO2 19*  --  21* 23  GLUCOSE 262*  --  161* 189*  BUN 19  --  16 17  CREATININE 1.61*  --  1.10* 1.19*  CALCIUM  9.2  --  8.7* 8.7*  MG  --  1.8  --   --    GFR CrCl cannot be calculated (Unknown ideal weight.). Liver Function Tests: Recent Labs  Lab 12/20/23 0909  AST 23  ALT 23  ALKPHOS 77  BILITOT 1.2  PROT 7.3  ALBUMIN 3.7   No results for input(s): "LIPASE", "AMYLASE" in the last 168 hours. No results for input(s): "AMMONIA" in the last 168 hours. Coagulation  profile No results for input(s): "INR", "PROTIME" in the last 168 hours.  CBC: Recent Labs  Lab 12/20/23 0909 12/21/23 0505  WBC 7.6 5.0  NEUTROABS 5.7 2.8  HGB 13.4 12.8  HCT 38.2 36.0  MCV 91.0 89.6  PLT 220 183   Cardiac Enzymes: No results for input(s): "CKTOTAL", "CKMB", "CKMBINDEX", "TROPONINI" in the last 168 hours. BNP (last 3 results) No results  for input(s): "PROBNP" in the last 8760 hours. CBG: Recent Labs  Lab 12/23/23 1950 12/24/23 0441 12/24/23 0736 12/24/23 1159 12/24/23 1545  GLUCAP 264* 236* 215* 309* 159*   D-Dimer: No results for input(s): "DDIMER" in the last 72 hours. Hgb A1c: No results for input(s): "HGBA1C" in the last 72 hours. Lipid Profile: No results for input(s): "CHOL", "HDL", "LDLCALC", "TRIG", "CHOLHDL", "LDLDIRECT" in the last 72 hours.  Thyroid function studies: No results for input(s): "TSH", "T4TOTAL", "T3FREE", "THYROIDAB" in the last 72 hours.  Invalid input(s): "FREET3" Anemia work up: No results for input(s): "VITAMINB12", "FOLATE", "FERRITIN", "TIBC", "IRON", "RETICCTPCT" in the last 72 hours. Sepsis Labs: Recent Labs  Lab 12/20/23 0909 12/21/23 0505  WBC 7.6 5.0    Microbiology No results found for this or any previous visit (from the past 240 hours).  Procedures and diagnostic studies:  No results found.              LOS: 3 days   Ladd Cen  [facility]   Pager on [email or web address]. If 7PM-7AM, please contact night-coverage at [email or web address]     12/24/2023, 3:55 PM

## 2023-12-25 DIAGNOSIS — I639 Cerebral infarction, unspecified: Secondary | ICD-10-CM | POA: Diagnosis not present

## 2023-12-25 LAB — GLUCOSE, CAPILLARY
Glucose-Capillary: 258 mg/dL — ABNORMAL HIGH (ref 70–99)
Glucose-Capillary: 242 mg/dL — ABNORMAL HIGH (ref 70–99)
Glucose-Capillary: 160 mg/dL — ABNORMAL HIGH (ref 70–99)
Glucose-Capillary: 175 mg/dL — ABNORMAL HIGH (ref 70–99)

## 2023-12-25 MED ORDER — INSULIN ASPART 100 UNIT/ML IJ SOLN
5.0000 [IU] | Freq: Three times a day (TID) | INTRAMUSCULAR | Status: DC
  Administered 2023-12-25 – 2023-12-28 (×8): 5 [IU] via SUBCUTANEOUS
  Filled 2023-12-25 (×8): qty 1

## 2023-12-25 NOTE — Inpatient Diabetes Management (Signed)
 Inpatient Diabetes Program Recommendations  AACE/ADA: New Consensus Statement on Inpatient Glycemic Control   Target Ranges:  Prepandial:   less than 140 mg/dL      Peak postprandial:   less than 180 mg/dL (1-2 hours)      Critically ill patients:  140 - 180 mg/dL    Latest Reference Range & Units 12/24/23 07:36 12/24/23 11:59 12/24/23 15:45 12/24/23 20:08 12/25/23 07:30  Glucose-Capillary 70 - 99 mg/dL 409 (H) 811 (H) 914 (H) 287 (H) 258 (H)   Review of Glycemic Control  Diabetes history: DM2 Outpatient Diabetes medications: Glipizide XL 10 mg daily, Amaryl  3 mg QAM, Jardiance 10 mg daily, Metformin 500 mg QAM, Metformin 1000 mg QPM Current orders for Inpatient glycemic control: Semglee 10 units daily, Novolog  0-15 units TID with meals, Novolog  0-5 units at bedtime, Novolog  3 units TID with meals   Inpatient Diabetes Program Recommendations:     Insulin : Please consider increasing Semglee to 13 units daily and meal coverage to Novolog  5 units TID with meals.  Thanks, Beacher Limerick, RN, MSN, CDCES Diabetes Coordinator Inpatient Diabetes Program [phone] (Team Pager from 8am to 5pm)

## 2023-12-25 NOTE — Progress Notes (Signed)
 Inpatient Rehab Admissions Coordinator:    I spoke with pt and her sister. They are aware of denial and would like to pursue patient's right to appeal this decision. I will file an expedited appeal today.  I faxed an assignment of representative form to CM, [facility], and she will have Pt. Sign form and fax it back to me so that I can begin appeal.   Wandalee Gust, MS, CCC-SLP Rehab Admissions Coordinator  [phone] (celll) [phone] (office)

## 2023-12-25 NOTE — Progress Notes (Signed)
 Progress Note    Haley Jones  ZOX:[id number] DOB: September 18, 1955  DOA: 12/20/2023 PCP: Sari Cunning, MD      Brief Narrative:    Medical records reviewed and are as summarized below:  Haley Jones is a 68 y.o. female with medical history significant of Hypertension, B12 deficiency, type 2 diabetes, hyperlipidemia, CKD stage IIIa, who presented to the hospital with right-sided weakness.  She also reported a 2 to 3-week history of dizziness and she has been falling more often.  She reported this to her PCP and she was started on meclizine.  Her sister came to visit her a day prior to admission and she noticed that she was not moving her right hand well.  Patient could not tell exactly when the symptoms started.  Patient is a primary caregiver for her disabled 40 year old daughter at home.   She was admitted to the hospital for left MCA stroke.     Assessment/Plan:   Principal Problem:   Acute CVA (cerebrovascular accident) [facility]) Active Problems:   Acute kidney injury superimposed on chronic kidney disease (HCC)   B12 deficiency   DM type 2 with diabetic mixed hyperlipidemia (HCC)   Primary hypertension    Acute left MCA stroke: Neurologist recommended dual antiplatelet therapy with low-dose aspirin  and Plavix for 90 days followed by Plavix monotherapy.  Continue Crestor . 2D echo showed EF estimated at 60 to 65%, grade 1 diastolic dysfunction.  No interatrial shunt. PT recommended rehab at the inpatient rehab center but insurance authorization was denied for inpatient rehab.  Follow-up with TOC to assist with disposition.   AKI on CKD stage IIIa: Improved.  Creatinine down from 1.61-1.10.   Hypokalemia: Improved   Hypertension: BP is optimal.  Continue lisinopril  and amlodipine    Type II DM with hyperglycemia: Increase Lantus from 10 units daily to 13 units daily.  Increase NovoLog  from 3 to 5 units 3 times daily with meals.  Continue sliding scale  insulin . Dulaglutide , metformin, glimepiride , glipizide and Jardiance have been held Hemoglobin A1c was 8.4 on 11/26/2023   Hyperlipidemia: Triglycerides 154, total cholesterol 170, HDL 59 LDL 80.  Continue Crestor    Vitamin B12 deficiency: She said she prefers to take the injections instead of pills.  Patient said she got a vitamin B12 shot at her doctor's office.  She has been advised to follow-up with PCP for regular vitamin B12 injections. Vitamin B12 was 175 on 11/26/2023    Comorbidities include chronic back pain, anxiety, depression, chronic sinusitis  Peer to peer review for insurance authorization on 12/24/2023 was unsuccessful.  Follow-up with TOC to assist with disposition to SNF.     Diet Order             Diet Carb Modified Fluid consistency: Thin  Diet effective now                            Consultants: Neurologist  Procedures: None    Medications:    aspirin  EC  81 mg Oral Daily   clonazePAM   1 mg Oral QHS   clopidogrel  75 mg Oral Daily   enoxaparin  (LOVENOX ) injection  40 mg Subcutaneous Q24H   feeding supplement  237 mL Oral BID BM   insulin  aspart  0-15 Units Subcutaneous TID WC   insulin  aspart  0-5 Units Subcutaneous QHS   insulin  aspart  3 Units Subcutaneous TID WC   insulin  glargine-yfgn  10  Units Subcutaneous Daily   pantoprazole   40 mg Oral QAC breakfast   pneumococcal 20-valent conjugate vaccine  0.5 mL Intramuscular Tomorrow-1000   rosuvastatin   20 mg Oral Daily   venlafaxine  XR  37.5 mg Oral Daily   Continuous Infusions:   Anti-infectives (From admission, onward)    None              Family Communication/Anticipated D/C date and plan/Code Status   DVT prophylaxis: enoxaparin  (LOVENOX ) injection 40 mg Start: 12/20/23 2200     Code Status: Full Code  Family Communication: None Disposition Plan: Plan to discharge to inpatient rehab   Status is: Inpatient Remains inpatient appropriate because: Acute  stroke         Subjective:   Interval events noted.  No complaints.  Objective:    Vitals:   12/25/23 0055 12/25/23 0426 12/25/23 0728 12/25/23 1152  BP: 102/88 111/80 (!) 90/55 120/77  Pulse: 88 80 75 83  Resp: 18 18 16 16   Temp: (!) 97.5 F (36.4 C) (!) 97.5 F (36.4 C) 98.3 F (36.8 C) 97.9 F (36.6 C)  TempSrc: Oral     SpO2: 97% 97% 99% 100%   No data found.   Intake/Output Summary (Last 24 hours) at 12/25/2023 1208 Last data filed at 12/25/2023 0010 Gross per 24 hour  Intake 360 ml  Output --  Net 360 ml   There were no vitals filed for this visit.  Exam:  GEN: NAD SKIN: Warm and dry EYES: Anicteric ENT: MMM CV: RRR PULM: CTA B ABD: soft, ND, NT, +BS CNS: AAO x 3, minimal right-sided tenderness EXT: No edema or tenderness     Data Reviewed:   I have personally reviewed following labs and imaging studies:  Labs: Labs show the following:   Basic Metabolic Panel: Recent Labs  Lab 12/20/23 0909 12/21/23 0503 12/21/23 0505 12/22/23 0643  NA 135  --  138 136  K 3.5  --  3.1* 3.6  CL 102  --  104 105  CO2 19*  --  21* 23  GLUCOSE 262*  --  161* 189*  BUN 19  --  16 17  CREATININE 1.61*  --  1.10* 1.19*  CALCIUM  9.2  --  8.7* 8.7*  MG  --  1.8  --   --    GFR CrCl cannot be calculated (Unknown ideal weight.). Liver Function Tests: Recent Labs  Lab 12/20/23 0909  AST 23  ALT 23  ALKPHOS 77  BILITOT 1.2  PROT 7.3  ALBUMIN 3.7   No results for input(s): "LIPASE", "AMYLASE" in the last 168 hours. No results for input(s): "AMMONIA" in the last 168 hours. Coagulation profile No results for input(s): "INR", "PROTIME" in the last 168 hours.  CBC: Recent Labs  Lab 12/20/23 0909 12/21/23 0505  WBC 7.6 5.0  NEUTROABS 5.7 2.8  HGB 13.4 12.8  HCT 38.2 36.0  MCV 91.0 89.6  PLT 220 183   Cardiac Enzymes: No results for input(s): "CKTOTAL", "CKMB", "CKMBINDEX", "TROPONINI" in the last 168 hours. BNP (last 3 results) No  results for input(s): "PROBNP" in the last 8760 hours. CBG: Recent Labs  Lab 12/24/23 1159 12/24/23 1545 12/24/23 2008 12/25/23 0730 12/25/23 1153  GLUCAP 309* 159* 287* 258* 242*   D-Dimer: No results for input(s): "DDIMER" in the last 72 hours. Hgb A1c: No results for input(s): "HGBA1C" in the last 72 hours. Lipid Profile: No results for input(s): "CHOL", "HDL", "LDLCALC", "TRIG", "CHOLHDL", "LDLDIRECT" in the last  72 hours.  Thyroid function studies: No results for input(s): "TSH", "T4TOTAL", "T3FREE", "THYROIDAB" in the last 72 hours.  Invalid input(s): "FREET3" Anemia work up: No results for input(s): "VITAMINB12", "FOLATE", "FERRITIN", "TIBC", "IRON", "RETICCTPCT" in the last 72 hours. Sepsis Labs: Recent Labs  Lab 12/20/23 0909 12/21/23 0505  WBC 7.6 5.0    Microbiology No results found for this or any previous visit (from the past 240 hours).  Procedures and diagnostic studies:  No results found.              LOS: 4 days   Gurjit Loconte  [facility]   Pager on [email or web address]. If 7PM-7AM, please contact night-coverage at [email or web address]     12/25/2023, 12:08 PM

## 2023-12-25 NOTE — Plan of Care (Signed)
   Problem: Education: Goal: Knowledge of disease or condition will improve Outcome: Progressing Goal: Knowledge of secondary prevention will improve (MUST DOCUMENT ALL) Outcome: Progressing Goal: Knowledge of patient specific risk factors will improve (DELETE if not current risk factor) Outcome: Progressing   Problem: Ischemic Stroke/TIA Tissue Perfusion: Goal: Complications of ischemic stroke/TIA will be minimized Outcome: Progressing

## 2023-12-25 NOTE — Progress Notes (Signed)
 Occupational Therapy Treatment Patient Details Name: Haley Jones MRN: [id number] DOB: 01/04/1956 Today's Date: 12/25/2023   History of present illness Pt is a 68 y.o. 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   OT comments  Chart reviewed to date, pt greeted in bed, oriented to self and grossly to place, agreeable to OT tx session targeting improving functional activity tolerance, RUE function and attention to R side to facilitate improved ADL performance. Bed mobility completed with MOD A, STS with MIN A, lateral steps up the bed to the L with MIN A. Frequent multi modal cues throughout required for technique.Educated pt/visitor regarding strategies to improve R sided attention including visitor sitting on R side and pt turning head during tasks targeting RUE function (see below). Pt is making progress towards goals, continue to recommend intensive inpatient rehab to facilitate return to PLOF. Pt is left in bed, all needs met. OT will continue to follow.       If plan is discharge home, recommend the following:  A little help with walking and/or transfers;A little help with bathing/dressing/bathroom;Assistance with cooking/housework;Direct supervision/assist for medications management;Assist for transportation;Help with stairs or ramp for entrance   Equipment Recommendations  Other (comment) (defer to next venue of care)    Recommendations for Other Services      Precautions / Restrictions Precautions Precautions: Fall Recall of Precautions/Restrictions: Impaired Restrictions Weight Bearing Restrictions Per Provider Order: No       Mobility Bed Mobility Overal bed mobility: Needs Assistance Bed Mobility: Supine to Sit, Sit to Supine     Supine to sit: Mod assist, HOB elevated Sit to supine: Mod assist, Used rails    General bed mobility comments: step by step multi modal cues for technique    Transfers Overall transfer level: Needs assistance Equipment used: Rolling walker (2 wheels) Transfers: Sit to/from Stand Sit to Stand: Min assist (5x, frequent multi modal cues for technique)           General transfer comment: three lateral steps up the bed to the L with MIN A with RW, frequent multi modal cues for technique     Balance Overall balance assessment: Needs assistance Sitting-balance support: Feet supported Sitting balance-Leahy Scale: Fair     Standing balance support: Bilateral upper extremity supported, During functional activity, Single extremity supported Standing balance-Leahy Scale: Poor                             ADL either performed or assessed with clinical judgement   ADL Overall ADL's : Needs assistance/impaired     Grooming: Wash/dry face;Sitting;Supervision/safety               Lower Body Dressing: Maximal assistance;Sit to/from stand                      Extremity/Trunk Assessment              Vision   Visual Fields: Right Tax adviser Perception: Impaired Preception Impairment Details: Inattention/Neglect Perception-Other Comments: heavy mutli modal cues for head turn to [employer]     Communication Communication Communication: No apparent difficulties   Cognition Arousal: Alert Behavior During Therapy: Anxious Cognition: Cognition impaired   Orientation impairments: Place, Time, Situation  Awareness: Intellectual awareness impaired, Online awareness impaired Memory impairment (select all impairments): Declarative long-term memory Attention impairment (select first level of impairment): Sustained attention Executive functioning impairment (select all impairments): Initiation, Organization, Sequencing, Reasoning, Problem solving                   Following commands:  Impaired Following commands impaired: Follows one step commands with increased time      Cueing   Cueing Techniques: Verbal cues, Gestural cues, Tactile cues  Exercises Other Exercises Other Exercises: dyanmic reaching task first reaching to L side with affected RUE 5x, CGA for dynamic sitting balance, then reaching to R shoulder height with LUE with frequent multi modal cues for head turn to target R sided attention Other Exercises: educated visitor Field seismologist) and patient for visitor to sit on R side of patient to faciliate improved R sided attention for improved ADL engagement Other Exercises: open/close R hand approx 10x on towel with MIN A, frequent multi modal cues    Shoulder Instructions       General Comments      Pertinent Vitals/ Pain       Pain Assessment Pain Assessment: No/denies pain  Home Living                                          Prior Functioning/Environment              Frequency  Min 3X/week        Progress Toward Goals  OT Goals(current goals can now be found in the care plan section)  Progress towards OT goals: Progressing toward goals  Acute Rehab OT Goals Time For Goal Achievement: 01/04/24  Plan      Co-evaluation                 AM-PAC OT "6 Clicks" Daily Activity     Outcome Measure   Help from another person eating meals?: None Help from another person taking care of personal grooming?: A Little Help from another person toileting, which includes using toliet, bedpan, or urinal?: A Little Help from another person bathing (including washing, rinsing, drying)?: A Lot Help from another person to put on and taking off regular upper body clothing?: A Little Help from another person to put on and taking off regular lower body clothing?: A Lot 6 Click Score: 17    End of Session Equipment Utilized During Treatment: Gait belt;Rolling walker (2 wheels)  OT Visit Diagnosis: Other symptoms and signs involving  cognitive function;Hemiplegia and hemiparesis;Low vision, both eyes (H54.2);History of falling (Z91.81) Hemiplegia - Right/Left: Right Hemiplegia - dominant/non-dominant: Non-Dominant Hemiplegia - caused by: Cerebral infarction   Activity Tolerance Patient tolerated treatment well   Patient Left in bed;with call bell/phone within reach;with bed alarm set;with family/visitor present   Nurse Communication Mobility status        Time: 1425-1445 OT Time Calculation (min): 20 min  Charges: OT General Charges $OT Visit: 1 Visit OT Treatments $Therapeutic Activity: 8-22 mins  Gerre Kraft, OTD OTR/L  12/25/23, 4:18 PM

## 2023-12-25 NOTE — Progress Notes (Signed)
 Physical Therapy Treatment Patient Details Name: Haley Jones MRN: [id number] DOB: [birth year] Today's Date: 12/25/2023   History of Present Illness Pt is a 68 y.o. 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    PT Comments  Pt received in bed, agreeable to PT. Pt continues to require repeated cues for safe mobility and location of R UE in space due to +neglect. Pt has difficulty with carry-over and maintaining focus. She remains at a very high fall risk even with support of RW due to R LE weakness and motor control. Pt's impulsivity and R UE neglect have also required assist throughout session to untangle UE from heart monitor wires and prevent injury. Pt unfortunately was denied by CIR and will need SNF once medically cleared for d/c.   If plan is discharge home, recommend the following: A lot of help with walking and/or transfers;A little help with bathing/dressing/bathroom;Assistance with cooking/housework;Supervision due to cognitive status;Assist for transportation;Help with stairs or ramp for entrance;Direct supervision/assist for medications management   Can travel by private vehicle        Equipment Recommendations  Other (comment) (Defer to next level of care)    Recommendations for Other Services       Precautions / Restrictions Precautions Precautions: Fall Recall of Precautions/Restrictions: Impaired Precaution/Restrictions Comments: Decreased safety awareness Restrictions Weight Bearing Restrictions Per Provider Order: No     Mobility  Bed Mobility Overal bed mobility: Needs Assistance Bed Mobility: Supine to Sit     Supine to sit: Mod assist, HOB elevated, Used rails     General bed mobility comments: Step by step verbal/tactile cues for getting to the EOB, very confused due to R sided deficits     Transfers Overall transfer level: Needs assistance Equipment used: Rolling walker (2 wheels) Transfers: Sit to/from Stand, Bed to chair/wheelchair/BSC Sit to Stand: Min assist, Mod assist           General transfer comment: Frequent cues for attending to R side of RW, often bumping into things, unable to navigate without tactile cue.    Ambulation/Gait Ambulation/Gait assistance: Min assist Gait Distance (Feet): 5 Feet Assistive device: Rolling walker (2 wheels) Gait Pattern/deviations: Step-to pattern, Decreased stride length, Decreased dorsiflexion - right, Decreased step length - right, Drifts right/left Gait velocity: decreased     General Gait Details: High fall risk, unsteady with changes in direction and navigating around objects   Stairs             Wheelchair Mobility     Tilt Bed    Modified Rankin (Stroke Patients Only)       Balance Overall balance assessment: Needs assistance Sitting-balance support: Feet supported Sitting balance-Leahy Scale: Fair Sitting balance - Comments: Cues to use UEs for self supporting   Standing balance support: Bilateral upper extremity supported, During functional activity, Single extremity supported Standing balance-Leahy Scale: Poor Standing balance comment: High fall risk                            Communication Communication Communication: No apparent difficulties  Cognition Arousal: Alert Behavior During Therapy: Anxious, Impulsive   PT - Cognitive impairments: History of cognitive impairments, Memory, Attention, Initiation, Sequencing, Problem solving, Safety/Judgement  PT - Cognition Comments: Pt very impulsive at times, requires constant cuing to stay on task and repeated cues for proper technique and safety awareness Following commands: Impaired Following commands impaired: Follows one step commands inconsistently    Cueing Cueing Techniques: Verbal cues,  Gestural cues, Tactile cues  Exercises General Exercises - Lower Extremity Ankle Circles/Pumps: AROM, Both, 10 reps Long Arc Quad: AROM, Both, 10 reps    General Comments General comments (skin integrity, edema, etc.):  (Pt educated on role of PT and benefits of OOB activity. Pt redirected several times addressing the same questions and concerns, pt perseverating over eating lunch while it is hot)      Pertinent Vitals/Pain Pain Assessment Pain Score: 0-No pain    Home Living                          Prior Function            PT Goals (current goals can now be found in the care plan section) Acute Rehab PT Goals Patient Stated Goal: to feel better    Frequency    Min 3X/week      PT Plan      Co-evaluation              AM-PAC PT "6 Clicks" Mobility   Outcome Measure  Help needed turning from your back to your side while in a flat bed without using bedrails?: A Little Help needed moving from lying on your back to sitting on the side of a flat bed without using bedrails?: A Lot Help needed moving to and from a bed to a chair (including a wheelchair)?: A Lot Help needed standing up from a chair using your arms (e.g., wheelchair or bedside chair)?: A Little Help needed to walk in hospital room?: A Lot Help needed climbing 3-5 steps with a railing? : A Lot 6 Click Score: 14    End of Session Equipment Utilized During Treatment: Gait belt Activity Tolerance: Patient tolerated treatment well Patient left: in chair;with call bell/phone within reach;with chair alarm set;with family/visitor present Nurse Communication: Mobility status PT Visit Diagnosis: Other abnormalities of gait and mobility (R26.89);Difficulty in walking, not elsewhere classified (R26.2);Muscle weakness (generalized) (M62.81)     Time: 6578-4696 PT Time Calculation (min) (ACUTE ONLY): 23 min  Charges:    $Therapeutic Exercise: 8-22 mins $Therapeutic Activity: 8-22 mins PT  General Charges $$ ACUTE PT VISIT: 1 Visit                    [name], PTA  [name] 12/25/2023, 12:43 PM

## 2023-12-25 NOTE — Progress Notes (Signed)
 Inpatient Rehab Admissions Coordinator:    I notified pt.'s sister that insurance denied CIR. She is going to discuss with Pt. And medical team and get back to me regarding whether or not she wants to appeal.   Wandalee Gust, MS, CCC-SLP Rehab Admissions Coordinator  [phone] (celll) [phone] (office)

## 2023-12-26 DIAGNOSIS — N189 Chronic kidney disease, unspecified: Secondary | ICD-10-CM | POA: Diagnosis not present

## 2023-12-26 DIAGNOSIS — N179 Acute kidney failure, unspecified: Secondary | ICD-10-CM | POA: Diagnosis not present

## 2023-12-26 DIAGNOSIS — I639 Cerebral infarction, unspecified: Secondary | ICD-10-CM | POA: Diagnosis not present

## 2023-12-26 DIAGNOSIS — E538 Deficiency of other specified B group vitamins: Secondary | ICD-10-CM | POA: Diagnosis not present

## 2023-12-26 LAB — GLUCOSE, CAPILLARY
Glucose-Capillary: 175 mg/dL — ABNORMAL HIGH (ref 70–99)
Glucose-Capillary: 213 mg/dL — ABNORMAL HIGH (ref 70–99)
Glucose-Capillary: 196 mg/dL — ABNORMAL HIGH (ref 70–99)
Glucose-Capillary: 196 mg/dL — ABNORMAL HIGH (ref 70–99)

## 2023-12-26 NOTE — Progress Notes (Signed)
 Inpatient Rehab Admissions Coordinator:    CIR following. I await decision on her appeal with payor and will update when a decision is received.   Wandalee Gust, MS, CCC-SLP Rehab Admissions Coordinator  [phone] (celll) [phone] (office)

## 2023-12-26 NOTE — Progress Notes (Signed)
 Progress Note   Patient: Haley Jones ZOX:[id number] DOB: 09-26-1955 DOA: 12/20/2023     5 DOS: the patient was seen and examined on 12/26/2023   Brief hospital course: Haley Jones is a 68 y.o. female with medical history significant of Hypertension, B12 deficiency, type 2 diabetes, hyperlipidemia, CKD stage IIIa, who presented to the hospital with right-sided weakness.  She also reported a 2 to 3-week history of dizziness and she has been falling more often.  She reported this to her PCP and she was started on meclizine.  Her sister came to visit her a day prior to admission and she noticed that she was not moving her right hand well  MRI showed:  1. Patchy acute cortical/subcortical infarcts within the left parietal and occipital lobes individually measuring up to 3 cm (MCA vascular territory). Subtle petechial hemorrhage associated with some of these infarcts. No frank hemorrhagic conversion. 2. Additional punctate left MCA territory cortical acute infarct within the left insula.  CT angiogram showed severe stenosis or nonocclusive thrombus at the left MCA bifurcation affecting the inferior division.  Patient is seen by neurology, left MCA stenosis does not require intervention.  Patient is started on aspirin , Plavix, and Crestor .  Currently pending acute rehab.   Principal Problem:   Acute CVA (cerebrovascular accident) [facility]) Active Problems:   Acute kidney injury superimposed on chronic kidney disease (HCC)   B12 deficiency   DM type 2 with diabetic mixed hyperlipidemia (HCC)   Primary hypertension   Assessment and Plan: Acute left MCA stroke:  Left MCA occlusion. Neurologist recommended dual antiplatelet therapy with low-dose aspirin  and Plavix for 90 days followed by Plavix monotherapy.  Continue Crestor . 2D echo showed EF estimated at 60 to 65%, grade 1 diastolic dysfunction.  No interatrial shunt. PT recommended rehab at the inpatient rehab center but  insurance  Insurance has denied inpatient rehab, family is appealing the decision.  Continue hold the patient here in the hospital until this is settled.     AKI on CKD stage IIIa:  Improved.  Creatinine down from 1.61-1.10.     Hypokalemia: Improved     Hypertension: BP is optimal.  Continue lisinopril  and amlodipine      Uncontrolled type II DM with hyperglycemia: Hemoglobin A1c was 8.4 on 11/26/2023 Insulin  dose increased yesterday, continue to follow.     Hyperlipidemia: Triglycerides 154, total cholesterol 170, HDL 59 LDL 80.  Continue Crestor      Vitamin B12 deficiency: She said she prefers to take the injections instead of pills.  Patient said she got a vitamin B12 shot at her doctor's office.  She has been advised to follow-up with PCP for regular vitamin B12 injections. Vitamin B12 was 175 on 11/26/2023       Comorbidities include chronic back pain, anxiety, depression, chronic sinusitis        Subjective:  Patient still has significant right arm weakness, no slurred speech.  Physical Exam: Vitals:   12/25/23 2011 12/26/23 0426 12/26/23 0752 12/26/23 1147  BP: (!) 141/89 134/81 121/72 (!) 144/99  Pulse: 96 80 82 89  Resp: 18 16  18   Temp: 98.3 F (36.8 C) 97.9 F (36.6 C) 97.9 F (36.6 C) 97.9 F (36.6 C)  TempSrc: Oral Oral Oral   SpO2: 100% 100% 100% 100%   General exam: Appears calm and comfortable  Respiratory system: Clear to auscultation. Respiratory effort normal. Cardiovascular system: S1 & S2 heard, RRR. No JVD, murmurs, rubs, gallops or clicks. No pedal edema. Gastrointestinal  system: Abdomen is nondistended, soft and nontender. No organomegaly or masses felt. Normal bowel sounds heard. Central nervous system: Alert and oriented.  Right arm weakness. Extremities: Symmetric 5 x 5 power. Skin: No rashes, lesions or ulcers Psychiatry: Judgement and insight appear normal. Mood & affect appropriate.    Data Reviewed:  Reviewed CT scan, MRI  results.  Lab results.  Family Communication: Sister updated at the bedside.  Disposition: Status is: Inpatient Remains inpatient appropriate because: Severity of disease.     Time spent: 35 minutes  Author: Donaciano Frizzle, MD 12/26/2023 1:12 PM  For on call review [email or web address].

## 2023-12-26 NOTE — Hospital Course (Signed)
 Haley Jones is a 68 y.o. female with medical history significant of Hypertension, B12 deficiency, type 2 diabetes, hyperlipidemia, CKD stage IIIa, who presented to the hospital with right-sided weakness.  She also reported a 2 to 3-week history of dizziness and she has been falling more often.  She reported this to her PCP and she was started on meclizine.  Her sister came to visit her a day prior to admission and she noticed that she was not moving her right hand well  MRI showed:  1. Patchy acute cortical/subcortical infarcts within the left parietal and occipital lobes individually measuring up to 3 cm (MCA vascular territory). Subtle petechial hemorrhage associated with some of these infarcts. No frank hemorrhagic conversion. 2. Additional punctate left MCA territory cortical acute infarct within the left insula.  CT angiogram showed severe stenosis or nonocclusive thrombus at the left MCA bifurcation affecting the inferior division.  Patient is seen by neurology, left MCA stenosis does not require intervention.  Patient is started on aspirin , Plavix, and Crestor .  Patient has been approved for rehab.

## 2023-12-26 NOTE — Progress Notes (Signed)
 Mobility Specialist - Progress Note     12/26/23 1144  Mobility  Activity Ambulated with assistance in hallway;Stood at bedside  Level of Assistance Contact guard assist, steadying assist  Assistive Device Front wheel walker  Distance Ambulated (ft) 30 ft  Range of Motion/Exercises Active  Activity Response Tolerated well  Mobility Referral Yes  Mobility visit 1 Mobility  Mobility Specialist Start Time (ACUTE ONLY) 1121  Mobility Specialist Stop Time (ACUTE ONLY) 1140  Mobility Specialist Time Calculation (min) (ACUTE ONLY) 19 min   Pt resting in recliner on RA upon entry. Pt STS MinA and ambulates to hallway outside of door CGA with RW. Pt given verbal and manual hand placement cuing on the walker and to step inside walker box. Pt turns around wide. Pt returned to recliner and left with needs in reach. Chair alarm activated.   Jerri Morale Mobility Specialist 12/26/23, 12:02 PM

## 2023-12-26 NOTE — Progress Notes (Signed)
 Occupational Therapy Treatment Patient Details Name: Haley Jones MRN: [id number] DOB: 03-Jul-1956 Today's Date: 12/26/2023   History of present illness Pt is a 68 y.o. 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   OT comments  Chart reviewed to date, pt greeted in bed, oriented to self, place, grossly to situation, agreeable to OT tx session targeting improved ADL performance and RUE attention/function. Improvements are noted throughout with pt performing bed mobility with MIN A, dynamic sitting on edge of bed for bathing with CGA with one LOB requiring assist for correction. MOD A required for UB/LB bathing with frequent multi modal cues for incorporation of RUE, STS with MIN A via HHA, short amb transfer with MIN-MOD A via HHA. Overall improved noted use of RUE with pt volitionally open/closing R hand and using as a functional assist during brushing teeth after bathing tasks. Pt is making progress towards goals, continues to be an excellent candidate for intensive rehab to facilitate return to PLOF. Pt is left in bedside chair, all needs met. OT will continue to follow.       If plan is discharge home, recommend the following:  A little help with walking and/or transfers;A little help with bathing/dressing/bathroom;Assistance with cooking/housework;Direct supervision/assist for medications management;Assist for transportation;Help with stairs or ramp for entrance   Equipment Recommendations  Other (comment) (defer to next venue of care)    Recommendations for Other Services      Precautions / Restrictions Precautions Precautions: Fall Recall of Precautions/Restrictions: Impaired Restrictions Weight Bearing Restrictions Per Provider Order: No       Mobility Bed Mobility Overal bed mobility: Needs Assistance Bed Mobility:  Supine to Sit     Supine to sit: Min assist, HOB elevated          Transfers Overall transfer level: Needs assistance Equipment used: 1 person hand held assist Transfers: Sit to/from Stand Sit to Stand: Min assist                 Balance Overall balance assessment: Needs assistance Sitting-balance support: Feet supported Sitting balance-Leahy Scale: Fair Sitting balance - Comments: posterior lean requiring assist for recorrection 1x during dyanmic ADL tasks (bathing) Postural control: Posterior lean Standing balance support: Single extremity supported, During functional activity Standing balance-Leahy Scale: Poor                             ADL either performed or assessed with clinical judgement   ADL   Eating/Feeding: Supervision/ safety;Sitting Eating/Feeding Details (indicate cue type and reason): frequent mutli modal cues for incorporation of RUE Grooming: Wash/dry face;Sitting;Supervision/safety   Upper Body Bathing: Moderate assistance;Sitting Upper Body Bathing Details (indicate cue type and reason): frequent multi modal cues for incorporation of RUE, pt does head turn and looks at R arm pit with cueing from therapist while washing with LUE; Lower Body Bathing: Moderate assistance;Sit to/from stand   Upper Body Dressing : Minimal assistance;Sitting Upper Body Dressing Details (indicate cue type and reason): hemi dressing technique Lower Body Dressing: Maximal assistance   Toilet Transfer: Minimal assistance;Moderate assistance Toilet Transfer Details (indicate cue type and reason): step pivot transfers to bedside chair with HHA, frequent multi modal cues for technique Toileting- Clothing Manipulation and Hygiene: Maximal assistance;Sit to/from stand Toileting - Architect Details (indicate cue  type and reason): for thoroughness            Extremity/Trunk Assessment Upper Extremity Assessment Upper Extremity Assessment: Left hand  dominant;RUE deficits/detail RUE Deficits / Details: on this date, pt is able to open/close R hand with verbal cues on command approx 5x; Noted improved use of RUE for bimanual tasks as evidenced by using R hand to hold cup during brushing teeth, verbal cues for continued R sided attention however            Vision       Perception Perception Perception: Impaired Preception Impairment Details: Inattention/Neglect Perception-Other Comments: frequent multi modal cues for head turn to RUE, improved bimanual use on this date   Praxis     Communication Communication Communication: No apparent difficulties   Cognition Arousal: Alert Behavior During Therapy: Impulsive Cognition: Cognition impaired   Orientation impairments: Time Awareness: Online awareness impaired (improved intellecutal awareness but does not appear to be at baseline) Memory impairment (select all impairments): Short-term memory, Working memory Attention impairment (select first level of impairment): Sustained attention Executive functioning impairment (select all impairments): Reasoning, Problem solving OT - Cognition Comments: Overall, improved from previous sessions but continues to perform below baseline                 Following commands: Impaired Following commands impaired: Follows one step commands with increased time (multi modal cues)      Cueing   Cueing Techniques: Verbal cues, Gestural cues, Tactile cues  Exercises Other Exercises Other Exercises: edu sister/pt re: role of OT, role of rehab, discharge recommendations, techniques to improve R sided attention    Shoulder Instructions       General Comments vss throughout    Pertinent Vitals/ Pain       Pain Assessment Pain Assessment: Faces Faces Pain Scale: Hurts a little bit Pain Location: R forearm/wrist, unable to describe but says it "hurts sometimes" Pain Intervention(s): Monitored during session  Home Living                                           Prior Functioning/Environment              Frequency  Min 3X/week        Progress Toward Goals  OT Goals(current goals can now be found in the care plan section)  Progress towards OT goals: Progressing toward goals  Acute Rehab OT Goals Time For Goal Achievement: 01/04/24  Plan      Co-evaluation                 AM-PAC OT "6 Clicks" Daily Activity     Outcome Measure   Help from another person eating meals?: None Help from another person taking care of personal grooming?: A Little Help from another person toileting, which includes using toliet, bedpan, or urinal?: A Little Help from another person bathing (including washing, rinsing, drying)?: A Lot Help from another person to put on and taking off regular upper body clothing?: A Little Help from another person to put on and taking off regular lower body clothing?: A Lot 6 Click Score: 17    End of Session Equipment Utilized During Treatment: Gait belt  OT Visit Diagnosis: Other symptoms and signs involving cognitive function;Hemiplegia and hemiparesis;Low vision, both eyes (H54.2);History of falling (Z91.81) Hemiplegia - Right/Left: Right Hemiplegia - dominant/non-dominant: Non-Dominant Hemiplegia - caused  by: Cerebral infarction   Activity Tolerance Patient tolerated treatment well   Patient Left in chair;with call bell/phone within reach;with chair alarm set   Nurse Communication Other (comment) (NT in room during bathing)        Time: 1610-9604 OT Time Calculation (min): 29 min  Charges: OT General Charges $OT Visit: 1 Visit OT Treatments $Self Care/Home Management : 8-22 mins $Neuromuscular Re-education: 8-22 mins  Gerre Kraft, OTD OTR/L  12/26/23, 10:38 AM

## 2023-12-26 NOTE — Progress Notes (Signed)
 Physical Therapy Treatment Patient Details Name: Haley Jones MRN: [id number] DOB: Oct 06, 1955 Today's Date: 12/26/2023   History of Present Illness Pt is a 67 y.o. 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    PT Comments  Pt received up in chair, incontinent of urine due to not knowing how to call nursing (pt has previously been educated on call button). Pt required MinA to stand from raised chair to RW. Prolonged static standing to assist with hygiene and linen. Short distance gait in room with RW (pt had recently ambulated out in hallway with Mobility team). Repeated multimodal cues during B LE strengthening exercises due to pt's difficulty following commands and impulsivity. Pt currently is not at independent functional baseline, requiring assistance during gait training, transfers, and overall mobility. R LE remains weak affecting swing through phase and occasionally buckles in standing activities. Initial recs for CIR remain appropriate.     If plan is discharge home, recommend the following: A lot of help with walking and/or transfers;A little help with bathing/dressing/bathroom;Assistance with cooking/housework;Supervision due to cognitive status;Assist for transportation;Help with stairs or ramp for entrance;Direct supervision/assist for medications management   Can travel by private vehicle        Equipment Recommendations  Other (comment) (Defer to next level of care)    Recommendations for Other Services       Precautions / Restrictions Precautions Precautions: Fall Recall of Precautions/Restrictions: Impaired Precaution/Restrictions Comments: Decreased safety awareness Restrictions Weight Bearing Restrictions Per Provider Order: No     Mobility  Bed Mobility Overal bed mobility: Needs Assistance Bed  Mobility: Sit to Supine       Sit to supine: Contact guard assist, Used rails        Transfers Overall transfer level: Needs assistance Equipment used: Rolling walker (2 wheels) Transfers: Sit to/from Stand Sit to Stand: Min assist, From elevated surface           General transfer comment: Static standing at RW for several minutes to assist with hygiene after incontinent of urine    Ambulation/Gait Ambulation/Gait assistance: Min assist Gait Distance (Feet): 10 Feet Assistive device: Rolling walker (2 wheels) Gait Pattern/deviations: Step-to pattern, Decreased stride length, Decreased dorsiflexion - right, Decreased step length - right, Drifts right/left Gait velocity: decreased     General Gait Details: High fall risk, unsteady with changes in direction and navigating around objects   Stairs             Wheelchair Mobility     Tilt Bed    Modified Rankin (Stroke Patients Only)       Balance Overall balance assessment: Needs assistance Sitting-balance support: Feet supported Sitting balance-Leahy Scale: Fair     Standing balance support: During functional activity, Bilateral upper extremity supported, Reliant on assistive device for balance Standing balance-Leahy Scale: Fair Standing balance comment: High fall risk                            Communication Communication Communication: No apparent difficulties  Cognition Arousal: Alert Behavior During Therapy: Impulsive   PT - Cognitive impairments: History of cognitive impairments, Memory, Attention, Initiation, Sequencing, Problem solving, Safety/Judgement                       PT - Cognition  Comments: Pt very impulsive at times, requires constant cuing to stay on task and repeated cues for proper technique and safety awareness Following commands: Impaired Following commands impaired: Follows one step commands with increased time    Cueing Cueing Techniques: Verbal cues,  Gestural cues, Tactile cues  Exercises General Exercises - Lower Extremity Ankle Circles/Pumps: AROM, Both, 10 reps Long Arc Quad: AROM, Both, 10 reps Hip ABduction/ADduction: AROM, Both, 10 reps Hip Flexion/Marching: AROM, Both, 10 reps    General Comments        Pertinent Vitals/Pain Pain Assessment Pain Assessment: No/denies pain    Home Living                          Prior Function            PT Goals (current goals can now be found in the care plan section) Acute Rehab PT Goals Patient Stated Goal: to feel better Progress towards PT goals: Progressing toward goals    Frequency    Min 3X/week      PT Plan      Co-evaluation              AM-PAC PT "6 Clicks" Mobility   Outcome Measure  Help needed turning from your back to your side while in a flat bed without using bedrails?: A Little Help needed moving from lying on your back to sitting on the side of a flat bed without using bedrails?: A Lot Help needed moving to and from a bed to a chair (including a wheelchair)?: A Lot Help needed standing up from a chair using your arms (e.g., wheelchair or bedside chair)?: A Little Help needed to walk in hospital room?: A Lot Help needed climbing 3-5 steps with a railing? : A Lot 6 Click Score: 14    End of Session Equipment Utilized During Treatment: Gait belt Activity Tolerance: Patient tolerated treatment well Patient left: in bed;with call bell/phone within reach;with bed alarm set Nurse Communication: Mobility status PT Visit Diagnosis: Other abnormalities of gait and mobility (R26.89);Difficulty in walking, not elsewhere classified (R26.2);Muscle weakness (generalized) (M62.81)     Time: 4098-1191 PT Time Calculation (min) (ACUTE ONLY): 24 min  Charges:    $Therapeutic Exercise: 8-22 mins $Therapeutic Activity: 8-22 mins PT General Charges $$ ACUTE PT VISIT: 1 Visit                    Haley Jones, PTA  Haley Jones 12/26/2023, 3:17 PM

## 2023-12-27 DIAGNOSIS — I639 Cerebral infarction, unspecified: Secondary | ICD-10-CM | POA: Diagnosis not present

## 2023-12-27 DIAGNOSIS — N189 Chronic kidney disease, unspecified: Secondary | ICD-10-CM | POA: Diagnosis not present

## 2023-12-27 DIAGNOSIS — E538 Deficiency of other specified B group vitamins: Secondary | ICD-10-CM | POA: Diagnosis not present

## 2023-12-27 DIAGNOSIS — N179 Acute kidney failure, unspecified: Secondary | ICD-10-CM | POA: Diagnosis not present

## 2023-12-27 LAB — GLUCOSE, CAPILLARY
Glucose-Capillary: 154 mg/dL — ABNORMAL HIGH (ref 70–99)
Glucose-Capillary: 231 mg/dL — ABNORMAL HIGH (ref 70–99)
Glucose-Capillary: 82 mg/dL (ref 70–99)
Glucose-Capillary: 260 mg/dL — ABNORMAL HIGH (ref 70–99)

## 2023-12-27 NOTE — Progress Notes (Addendum)
 Occupational Therapy Treatment Patient Details Name: Haley Jones MRN: [id number] DOB: 07-22-1956 Today's Date: 12/27/2023   History of present illness Pt is a 68 y.o. 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   OT comments  Chart reviewed to date, pt greeted in bed, appears to be incontinent of urine. Pt is oriented to self, place, grossly to situation. Pt is agreeable to OT tx session targeting improving RUE function, functional mobility in prep for ADL tasks. Pt continues to presents with improvement in overall function and function mobility but does continue to perform ADL below PLOF. MIN A required for bed mobility with fair static sitting balance, two posterior LOB. STS with MIN A via HHA, amb approx 56' with 2 person HHA with MIN-MOD A, frequent multi modal cues for technique. Noted overall improved RUE use with pt attempting to utilize R hand as a functional assist but again continues to perform below PLOF. Discussed toileting schedule with NT. Pt is left in chair, all needs met. OT will continue to follow.       If plan is discharge home, recommend the following:  A little help with walking and/or transfers;A little help with bathing/dressing/bathroom;Assistance with cooking/housework;Direct supervision/assist for medications management;Assist for transportation;Help with stairs or ramp for entrance   Equipment Recommendations  Other (comment) (defer to next venue of care)    Recommendations for Other Services      Precautions / Restrictions Precautions Precautions: Fall Recall of Precautions/Restrictions: Impaired Restrictions Weight Bearing Restrictions Per Provider Order: No       Mobility Bed Mobility Overal bed mobility: Needs Assistance Bed Mobility: Supine to Sit     Supine to sit: Min assist, HOB  elevated          Transfers Overall transfer level: Needs assistance Equipment used: None Transfers: Sit to/from Stand Sit to Stand: Min assist (via 1 HHA)                 Balance Overall balance assessment: Needs assistance Sitting-balance support: Feet supported Sitting balance-Leahy Scale: Fair Sitting balance - Comments: two posterior LOB in sitting Postural control: Posterior lean Standing balance support: Bilateral upper extremity supported, During functional activity, Reliant on assistive device for balance Standing balance-Leahy Scale: Fair                             ADL either performed or assessed with clinical judgement   ADL Overall ADL's : Needs assistance/impaired                 Upper Body Dressing : Minimal assistance;Sitting Upper Body Dressing Details (indicate cue type and reason): frequent multi modal cues for hemi dressing technique     Toilet Transfer: Minimal assistance;Moderate assistance;Ambulation;Cueing for sequencing Toilet Transfer Details (indicate cue type and reason): simulated with 2 person HHA Toileting- Clothing Manipulation and Hygiene: Maximal assistance;Sit to/from stand       Functional mobility during ADLs: Minimal assistance;Moderate assistance;Cueing for sequencing;Cueing for safety;+2 for physical assistance;+2 for safety/equipment (two person HHA approx 75')      Extremity/Trunk Assessment Upper Extremity Assessment RUE Deficits / Details: noted improvements in RUE fuction- shoulder flexion AROM approx 90 degrees, elbow approx 3/4 full AROM, wrist 3/4 full AROM; MAS 0 in shoulder, 1 in elbow flexion/extension  Vision       Perception Perception Preception Impairment Details: Inattention/Neglect Perception-Other Comments: improved R head turn for attention to RUE, improved use as a functional assist   Praxis     Communication Communication Communication: No apparent difficulties    Cognition Arousal: Alert Behavior During Therapy: WFL for tasks assessed/performed Cognition: Cognition impaired   Orientation impairments: Time Awareness: Online awareness impaired Memory impairment (select all impairments): Short-term memory, Working memory Attention impairment (select first level of impairment): Selective attention Executive functioning impairment (select all impairments): Reasoning, Problem solving                   Following commands: Impaired Following commands impaired: Follows one step commands with increased time      Cueing   Cueing Techniques: Verbal cues, Gestural cues, Tactile cues  Exercises Other Exercises Other Exercises: edu re: role of OT, role of rehab, RUE HEP with pink sponge to be delivered to room Other Exercises: Weightbearing through RUE in sitting via lateral leans 10x 1 set with MOD A for proprioceptive input in prep for use of RUE in ADL tasks    Shoulder Instructions       General Comments vss throughout    Pertinent Vitals/ Pain       Pain Assessment Pain Assessment: No/denies pain  Home Living                                          Prior Functioning/Environment              Frequency  Min 3X/week        Progress Toward Goals  OT Goals(current goals can now be found in the care plan section)  Progress towards OT goals: Progressing toward goals  Acute Rehab OT Goals Time For Goal Achievement: 01/04/24  Plan      Co-evaluation                 AM-PAC OT "6 Clicks" Daily Activity     Outcome Measure   Help from another person eating meals?: None Help from another person taking care of personal grooming?: A Little Help from another person toileting, which includes using toliet, bedpan, or urinal?: A Little Help from another person bathing (including washing, rinsing, drying)?: A Lot Help from another person to put on and taking off regular upper body clothing?: A  Little Help from another person to put on and taking off regular lower body clothing?: A Lot 6 Click Score: 17    End of Session Equipment Utilized During Treatment: Gait belt  OT Visit Diagnosis: Other symptoms and signs involving cognitive function;Hemiplegia and hemiparesis;Low vision, both eyes (H54.2);History of falling (Z91.81) Hemiplegia - Right/Left: Right Hemiplegia - dominant/non-dominant: Non-Dominant Hemiplegia - caused by: Cerebral infarction   Activity Tolerance Patient tolerated treatment well   Patient Left in chair;with call bell/phone within reach;with chair alarm set   Nurse Communication Other (comment) (NT re status)        Time: 4403-4742 OT Time Calculation (min): 29 min  Charges: OT General Charges $OT Visit: 1 Visit OT Treatments $Self Care/Home Management : 8-22 mins $Therapeutic Activity: 8-22 mins Gerre Kraft, OTD OTR/L  12/27/23, 12:42 PM

## 2023-12-27 NOTE — Progress Notes (Signed)
 Sister visited today and spent 2 hours with patient. Sister was very supportive and encouraging. Sister stated that she would visit again tomorrow. I explained to her that the goal is rehab which she agreed. Patient got out of bed for dinner and needed a lot of trunk support and help with balancing to stay in the upright sitting position to eat her meal.

## 2023-12-27 NOTE — Plan of Care (Signed)

## 2023-12-27 NOTE — Progress Notes (Signed)
   12/27/23 1315  Spiritual Encounters  Type of Visit Follow up  Care provided to: Pt and family  Conversation partners present during encounter Nurse  Reason for visit Advance directives  OnCall Visit Yes   Chaplain received call to Chaplain phone that patient wanted to complete an AD that had been started earlier in the week.  Chaplain secured notary from [facility] and found 2 witnesses.  Chaplain scanned in documents to applicable department for patient's chart and put a copy in patient's binder and gave patient 1 copy and the original.    Rev. Rana M. Nolon Baxter, M.Div. Chaplain Resident [facility]

## 2023-12-27 NOTE — Plan of Care (Signed)
 Problem: Education: Goal: Knowledge of disease or condition will improve 12/27/2023 0208 by Orene Binning, RN Outcome: Progressing 12/27/2023 0208 by Orene Binning, RN Outcome: Progressing Goal: Knowledge of secondary prevention will improve (MUST DOCUMENT ALL) 12/27/2023 0208 by Orene Binning, RN Outcome: Progressing 12/27/2023 0208 by Orene Binning, RN Outcome: Progressing Goal: Knowledge of patient specific risk factors will improve (DELETE if not current risk factor) 12/27/2023 0208 by Orene Binning, RN Outcome: Progressing 12/27/2023 0208 by Orene Binning, RN Outcome: Progressing   Problem: Ischemic Stroke/TIA Tissue Perfusion: Goal: Complications of ischemic stroke/TIA will be minimized 12/27/2023 0208 by Orene Binning, RN Outcome: Progressing 12/27/2023 0208 by Orene Binning, RN Outcome: Progressing   Problem: Coping: Goal: Will verbalize positive feelings about self 12/27/2023 0208 by Orene Binning, RN Outcome: Progressing 12/27/2023 0208 by Orene Binning, RN Outcome: Progressing Goal: Will identify appropriate support needs 12/27/2023 0208 by Orene Binning, RN Outcome: Progressing 12/27/2023 0208 by Orene Binning, RN Outcome: Progressing   Problem: Health Behavior/Discharge Planning: Goal: Ability to manage health-related needs will improve 12/27/2023 0208 by Orene Binning, RN Outcome: Progressing 12/27/2023 0208 by Orene Binning, RN Outcome: Progressing Goal: Goals will be collaboratively established with patient/family 12/27/2023 0208 by Orene Binning, RN Outcome: Progressing 12/27/2023 0208 by Orene Binning, RN Outcome: Progressing   Problem: Self-Care: Goal: Ability to participate in self-care as condition permits will improve 12/27/2023 0208 by Orene Binning, RN Outcome: Progressing 12/27/2023 0208 by Orene Binning, RN Outcome: Progressing Goal: Verbalization of feelings and concerns over difficulty with self-care will improve 12/27/2023 0208 by  Orene Binning, RN Outcome: Progressing 12/27/2023 0208 by Orene Binning, RN Outcome: Progressing Goal: Ability to communicate needs accurately will improve 12/27/2023 0208 by Orene Binning, RN Outcome: Progressing 12/27/2023 0208 by Orene Binning, RN Outcome: Progressing   Problem: Nutrition: Goal: Risk of aspiration will decrease 12/27/2023 0208 by Orene Binning, RN Outcome: Progressing 12/27/2023 0208 by Orene Binning, RN Outcome: Progressing Goal: Dietary intake will improve 12/27/2023 0208 by Orene Binning, RN Outcome: Progressing 12/27/2023 0208 by Orene Binning, RN Outcome: Progressing   Problem: Education: Goal: Knowledge of General Education information will improve Description: Including pain rating scale, medication(s)/side effects and non-pharmacologic comfort measures 12/27/2023 0208 by Orene Binning, RN Outcome: Progressing 12/27/2023 0208 by Orene Binning, RN Outcome: Progressing   Problem: Health Behavior/Discharge Planning: Goal: Ability to manage health-related needs will improve 12/27/2023 0208 by Orene Binning, RN Outcome: Progressing 12/27/2023 0208 by Orene Binning, RN Outcome: Progressing   Problem: Clinical Measurements: Goal: Ability to maintain clinical measurements within normal limits will improve 12/27/2023 0208 by Orene Binning, RN Outcome: Progressing 12/27/2023 0208 by Orene Binning, RN Outcome: Progressing Goal: Will remain free from infection 12/27/2023 0208 by Orene Binning, RN Outcome: Progressing 12/27/2023 0208 by Orene Binning, RN Outcome: Progressing Goal: Diagnostic test results will improve 12/27/2023 0208 by Orene Binning, RN Outcome: Progressing 12/27/2023 0208 by Orene Binning, RN Outcome: Progressing Goal: Respiratory complications will improve 12/27/2023 0208 by Orene Binning, RN Outcome: Progressing 12/27/2023 0208 by Orene Binning, RN Outcome: Progressing Goal: Cardiovascular complication will be  avoided 12/27/2023 0208 by Orene Binning, RN Outcome: Progressing 12/27/2023 0208 by Orene Binning, RN Outcome: Progressing   Problem: Activity: Goal: Risk for activity intolerance will decrease 12/27/2023 0208 by Orene Binning, RN Outcome: Progressing 12/27/2023 0208 by  Orene Binning, RN Outcome: Progressing   Problem: Nutrition: Goal: Adequate nutrition will be maintained 12/27/2023 0208 by Orene Binning, RN Outcome: Progressing 12/27/2023 0208 by Orene Binning, RN Outcome: Progressing   Problem: Coping: Goal: Level of anxiety will decrease 12/27/2023 0208 by Orene Binning, RN Outcome: Progressing 12/27/2023 0208 by Orene Binning, RN Outcome: Progressing   Problem: Elimination: Goal: Will not experience complications related to bowel motility 12/27/2023 0208 by Orene Binning, RN Outcome: Progressing 12/27/2023 0208 by Orene Binning, RN Outcome: Progressing Goal: Will not experience complications related to urinary retention 12/27/2023 0208 by Orene Binning, RN Outcome: Progressing 12/27/2023 0208 by Orene Binning, RN Outcome: Progressing   Problem: Pain Managment: Goal: General experience of comfort will improve and/or be controlled 12/27/2023 0208 by Orene Binning, RN Outcome: Progressing 12/27/2023 0208 by Orene Binning, RN Outcome: Progressing   Problem: Safety: Goal: Ability to remain free from injury will improve 12/27/2023 0208 by Orene Binning, RN Outcome: Progressing 12/27/2023 0208 by Orene Binning, RN Outcome: Progressing   Problem: Skin Integrity: Goal: Risk for impaired skin integrity will decrease 12/27/2023 0208 by Orene Binning, RN Outcome: Progressing 12/27/2023 0208 by Orene Binning, RN Outcome: Progressing   Problem: Education: Goal: Ability to describe self-care measures that may prevent or decrease complications (Diabetes Survival Skills Education) will improve 12/27/2023 0208 by Orene Binning, RN Outcome: Progressing 12/27/2023  0208 by Orene Binning, RN Outcome: Progressing Goal: Individualized Educational Video(s) 12/27/2023 0208 by Orene Binning, RN Outcome: Progressing 12/27/2023 0208 by Orene Binning, RN Outcome: Progressing   Problem: Coping: Goal: Ability to adjust to condition or change in health will improve 12/27/2023 0208 by Orene Binning, RN Outcome: Progressing 12/27/2023 0208 by Orene Binning, RN Outcome: Progressing   Problem: Fluid Volume: Goal: Ability to maintain a balanced intake and output will improve 12/27/2023 0208 by Orene Binning, RN Outcome: Progressing 12/27/2023 0208 by Orene Binning, RN Outcome: Progressing   Problem: Health Behavior/Discharge Planning: Goal: Ability to identify and utilize available resources and services will improve 12/27/2023 0208 by Orene Binning, RN Outcome: Progressing 12/27/2023 0208 by Orene Binning, RN Outcome: Progressing Goal: Ability to manage health-related needs will improve 12/27/2023 0208 by Orene Binning, RN Outcome: Progressing 12/27/2023 0208 by Orene Binning, RN Outcome: Progressing   Problem: Metabolic: Goal: Ability to maintain appropriate glucose levels will improve 12/27/2023 0208 by Orene Binning, RN Outcome: Progressing 12/27/2023 0208 by Orene Binning, RN Outcome: Progressing   Problem: Nutritional: Goal: Maintenance of adequate nutrition will improve 12/27/2023 0208 by Orene Binning, RN Outcome: Progressing 12/27/2023 0208 by Orene Binning, RN Outcome: Progressing Goal: Progress toward achieving an optimal weight will improve 12/27/2023 0208 by Orene Binning, RN Outcome: Progressing 12/27/2023 0208 by Orene Binning, RN Outcome: Progressing   Problem: Skin Integrity: Goal: Risk for impaired skin integrity will decrease 12/27/2023 0208 by Orene Binning, RN Outcome: Progressing 12/27/2023 0208 by Orene Binning, RN Outcome: Progressing   Problem: Tissue Perfusion: Goal: Adequacy of tissue perfusion will  improve 12/27/2023 0208 by Orene Binning, RN Outcome: Progressing 12/27/2023 0208 by Orene Binning, RN Outcome: Progressing

## 2023-12-27 NOTE — Progress Notes (Signed)
 Progress Note   Patient: Haley Jones ZOX:[id number] DOB: Jan 09, 1956 DOA: 12/20/2023     6 DOS: the patient was seen and examined on 12/27/2023   Brief hospital course: Ishitha Hannasch is a 68 y.o. female with medical history significant of Hypertension, B12 deficiency, type 2 diabetes, hyperlipidemia, CKD stage IIIa, who presented to the hospital with right-sided weakness.  She also reported a 2 to 3-week history of dizziness and she has been falling more often.  She reported this to her PCP and she was started on meclizine.  Her sister came to visit her a day prior to admission and she noticed that she was not moving her right hand well  MRI showed:  1. Patchy acute cortical/subcortical infarcts within the left parietal and occipital lobes individually measuring up to 3 cm (MCA vascular territory). Subtle petechial hemorrhage associated with some of these infarcts. No frank hemorrhagic conversion. 2. Additional punctate left MCA territory cortical acute infarct within the left insula.  CT angiogram showed severe stenosis or nonocclusive thrombus at the left MCA bifurcation affecting the inferior division.  Patient is seen by neurology, left MCA stenosis does not require intervention.  Patient is started on aspirin , Plavix, and Crestor .  Currently pending acute rehab.   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 Neurologist recommended dual antiplatelet therapy with low-dose aspirin  and Plavix for 90 days followed by Plavix monotherapy.  Continue Crestor . 2D echo showed EF estimated at 60 to 65%, grade 1 diastolic dysfunction.  No interatrial shunt. PT recommended rehab at the inpatient rehab center but  insurance  Insurance has denied inpatient rehab, family is appealing the decision.   Continue to follow patient.  Continue PT/OT while in the hospital.    AKI on CKD stage IIIa:  Hypokalemia. Renal function has improved to baseline.   Hypertension:  BP is optimal.  Continue lisinopril  and amlodipine     Uncontrolled type II DM with hyperglycemia: Hemoglobin A1c was 8.4 on 11/26/2023 Continue current dose of insulin .    Hyperlipidemia: Continue Crestor      Vitamin B12 deficiency:  Follow-up with PCP for B12 injection.     Comorbidities include chronic back pain, anxiety, depression, chronic sinusitis      Subjective: Patient doing well, still has some right arm weakness, but seems to be better today.  Physical Exam: Vitals:   12/27/23 0103 12/27/23 0526 12/27/23 0734 12/27/23 1200  BP: 134/81 (!) 140/77 127/77   Pulse: 80 71 71   Resp: 14  16   Temp:  98 F (36.7 C) 97.9 F (36.6 C)   TempSrc:  Oral    SpO2: 100% 99% 100% 99%   General exam: Appears calm and comfortable  Respiratory system: Clear to auscultation. Respiratory effort normal. Cardiovascular system: S1 & S2 heard, RRR. No JVD, murmurs, rubs, gallops or clicks. No pedal edema. Gastrointestinal system: Abdomen is nondistended, soft and nontender. No organomegaly or masses felt. Normal bowel sounds heard. Central nervous system: Alert and oriented.  Right arm weakness. Extremities: Symmetric 5 x 5 power. Skin: No rashes, lesions or ulcers Psychiatry: Judgement and insight appear normal. Mood & affect appropriate.    Data Reviewed:  There are no new results to review at this time.  Family Communication: None  Disposition: Status is: Inpatient Remains inpatient appropriate because: Unsafe discharge, pending decision about rehab.     Time spent: 35 minutes  Author: Donaciano Frizzle, MD 12/27/2023 1:35 PM  For on call review [email or web address].

## 2023-12-28 ENCOUNTER — Encounter (HOSPITAL_COMMUNITY): Payer: Self-pay | Admitting: Physical Medicine and Rehabilitation

## 2023-12-28 ENCOUNTER — Other Ambulatory Visit: Payer: Self-pay

## 2023-12-28 ENCOUNTER — Inpatient Hospital Stay (HOSPITAL_COMMUNITY)
Admission: AD | Admit: 2023-12-28 | Discharge: 2024-01-18 | DRG: 057 | Disposition: A | Discharge status: Home / Self Care | Source: Other Acute Inpatient Hospital | Attending: Physical Medicine and Rehabilitation | Admitting: Physical Medicine and Rehabilitation

## 2023-12-28 DIAGNOSIS — H509 Unspecified strabismus: Secondary | ICD-10-CM | POA: Diagnosis present

## 2023-12-28 DIAGNOSIS — M545 Low back pain, unspecified: Secondary | ICD-10-CM | POA: Diagnosis present

## 2023-12-28 DIAGNOSIS — F32A Depression, unspecified: Secondary | ICD-10-CM | POA: Diagnosis present

## 2023-12-28 DIAGNOSIS — X58XXXA Exposure to other specified factors, initial encounter: Secondary | ICD-10-CM | POA: Diagnosis present

## 2023-12-28 DIAGNOSIS — M79641 Pain in right hand: Secondary | ICD-10-CM | POA: Diagnosis not present

## 2023-12-28 DIAGNOSIS — K5900 Constipation, unspecified: Secondary | ICD-10-CM | POA: Diagnosis not present

## 2023-12-28 DIAGNOSIS — N390 Urinary tract infection, site not specified: Secondary | ICD-10-CM | POA: Diagnosis not present

## 2023-12-28 DIAGNOSIS — R296 Repeated falls: Secondary | ICD-10-CM | POA: Diagnosis present

## 2023-12-28 DIAGNOSIS — H5347 Heteronymous bilateral field defects: Secondary | ICD-10-CM | POA: Diagnosis present

## 2023-12-28 DIAGNOSIS — Z743 Need for continuous supervision: Secondary | ICD-10-CM | POA: Diagnosis not present

## 2023-12-28 DIAGNOSIS — Z90710 Acquired absence of both cervix and uterus: Secondary | ICD-10-CM

## 2023-12-28 DIAGNOSIS — R414 Neurologic neglect syndrome: Secondary | ICD-10-CM | POA: Diagnosis present

## 2023-12-28 DIAGNOSIS — R519 Headache, unspecified: Secondary | ICD-10-CM

## 2023-12-28 DIAGNOSIS — K219 Gastro-esophageal reflux disease without esophagitis: Secondary | ICD-10-CM | POA: Diagnosis present

## 2023-12-28 DIAGNOSIS — B9620 Unspecified Escherichia coli [E. coli] as the cause of diseases classified elsewhere: Secondary | ICD-10-CM | POA: Diagnosis not present

## 2023-12-28 DIAGNOSIS — R233 Spontaneous ecchymoses: Secondary | ICD-10-CM | POA: Diagnosis present

## 2023-12-28 DIAGNOSIS — E785 Hyperlipidemia, unspecified: Secondary | ICD-10-CM | POA: Diagnosis present

## 2023-12-28 DIAGNOSIS — Z885 Allergy status to narcotic agent status: Secondary | ICD-10-CM

## 2023-12-28 DIAGNOSIS — F418 Other specified anxiety disorders: Secondary | ICD-10-CM | POA: Diagnosis not present

## 2023-12-28 DIAGNOSIS — Z634 Disappearance and death of family member: Secondary | ICD-10-CM

## 2023-12-28 DIAGNOSIS — E11649 Type 2 diabetes mellitus with hypoglycemia without coma: Secondary | ICD-10-CM | POA: Diagnosis not present

## 2023-12-28 DIAGNOSIS — E1169 Type 2 diabetes mellitus with other specified complication: Secondary | ICD-10-CM | POA: Diagnosis not present

## 2023-12-28 DIAGNOSIS — I63512 Cerebral infarction due to unspecified occlusion or stenosis of left middle cerebral artery: Principal | ICD-10-CM | POA: Diagnosis present

## 2023-12-28 DIAGNOSIS — I639 Cerebral infarction, unspecified: Secondary | ICD-10-CM | POA: Diagnosis not present

## 2023-12-28 DIAGNOSIS — E538 Deficiency of other specified B group vitamins: Secondary | ICD-10-CM | POA: Diagnosis present

## 2023-12-28 DIAGNOSIS — Z801 Family history of malignant neoplasm of trachea, bronchus and lung: Secondary | ICD-10-CM

## 2023-12-28 DIAGNOSIS — R2689 Other abnormalities of gait and mobility: Secondary | ICD-10-CM | POA: Diagnosis present

## 2023-12-28 DIAGNOSIS — G8929 Other chronic pain: Secondary | ICD-10-CM | POA: Diagnosis present

## 2023-12-28 DIAGNOSIS — I69351 Hemiplegia and hemiparesis following cerebral infarction affecting right dominant side: Secondary | ICD-10-CM

## 2023-12-28 DIAGNOSIS — I69398 Other sequelae of cerebral infarction: Secondary | ICD-10-CM | POA: Diagnosis present

## 2023-12-28 DIAGNOSIS — S40022A Contusion of left upper arm, initial encounter: Secondary | ICD-10-CM | POA: Diagnosis present

## 2023-12-28 DIAGNOSIS — Z808 Family history of malignant neoplasm of other organs or systems: Secondary | ICD-10-CM

## 2023-12-28 DIAGNOSIS — K5909 Other constipation: Secondary | ICD-10-CM | POA: Diagnosis present

## 2023-12-28 DIAGNOSIS — R3915 Urgency of urination: Secondary | ICD-10-CM | POA: Diagnosis not present

## 2023-12-28 DIAGNOSIS — M549 Dorsalgia, unspecified: Secondary | ICD-10-CM | POA: Diagnosis present

## 2023-12-28 DIAGNOSIS — Z79899 Other long term (current) drug therapy: Secondary | ICD-10-CM

## 2023-12-28 DIAGNOSIS — Z794 Long term (current) use of insulin: Secondary | ICD-10-CM | POA: Diagnosis not present

## 2023-12-28 DIAGNOSIS — E1122 Type 2 diabetes mellitus with diabetic chronic kidney disease: Secondary | ICD-10-CM | POA: Diagnosis present

## 2023-12-28 DIAGNOSIS — I69311 Memory deficit following cerebral infarction: Secondary | ICD-10-CM

## 2023-12-28 DIAGNOSIS — F419 Anxiety disorder, unspecified: Secondary | ICD-10-CM | POA: Diagnosis present

## 2023-12-28 DIAGNOSIS — I129 Hypertensive chronic kidney disease with stage 1 through stage 4 chronic kidney disease, or unspecified chronic kidney disease: Secondary | ICD-10-CM | POA: Diagnosis present

## 2023-12-28 DIAGNOSIS — H538 Other visual disturbances: Secondary | ICD-10-CM | POA: Diagnosis present

## 2023-12-28 DIAGNOSIS — I10 Essential (primary) hypertension: Secondary | ICD-10-CM

## 2023-12-28 DIAGNOSIS — R42 Dizziness and giddiness: Secondary | ICD-10-CM | POA: Diagnosis not present

## 2023-12-28 DIAGNOSIS — Z7984 Long term (current) use of oral hypoglycemic drugs: Secondary | ICD-10-CM

## 2023-12-28 DIAGNOSIS — N1832 Chronic kidney disease, stage 3b: Secondary | ICD-10-CM | POA: Diagnosis present

## 2023-12-28 DIAGNOSIS — Z888 Allergy status to other drugs, medicaments and biological substances status: Secondary | ICD-10-CM

## 2023-12-28 DIAGNOSIS — Z7982 Long term (current) use of aspirin: Secondary | ICD-10-CM

## 2023-12-28 DIAGNOSIS — I69318 Other symptoms and signs involving cognitive functions following cerebral infarction: Secondary | ICD-10-CM

## 2023-12-28 DIAGNOSIS — I959 Hypotension, unspecified: Secondary | ICD-10-CM | POA: Diagnosis not present

## 2023-12-28 DIAGNOSIS — N189 Chronic kidney disease, unspecified: Secondary | ICD-10-CM

## 2023-12-28 DIAGNOSIS — I69392 Facial weakness following cerebral infarction: Secondary | ICD-10-CM

## 2023-12-28 DIAGNOSIS — I69322 Dysarthria following cerebral infarction: Secondary | ICD-10-CM

## 2023-12-28 DIAGNOSIS — I69390 Apraxia following cerebral infarction: Secondary | ICD-10-CM

## 2023-12-28 DIAGNOSIS — Z87440 Personal history of urinary (tract) infections: Secondary | ICD-10-CM

## 2023-12-28 DIAGNOSIS — Z7985 Long-term (current) use of injectable non-insulin antidiabetic drugs: Secondary | ICD-10-CM

## 2023-12-28 DIAGNOSIS — E860 Dehydration: Secondary | ICD-10-CM | POA: Diagnosis not present

## 2023-12-28 DIAGNOSIS — Z8041 Family history of malignant neoplasm of ovary: Secondary | ICD-10-CM

## 2023-12-28 DIAGNOSIS — Z880 Allergy status to penicillin: Secondary | ICD-10-CM

## 2023-12-28 DIAGNOSIS — I6931 Attention and concentration deficit following cerebral infarction: Secondary | ICD-10-CM

## 2023-12-28 DIAGNOSIS — Z9889 Other specified postprocedural states: Secondary | ICD-10-CM

## 2023-12-28 DIAGNOSIS — N179 Acute kidney failure, unspecified: Secondary | ICD-10-CM | POA: Diagnosis present

## 2023-12-28 DIAGNOSIS — R32 Unspecified urinary incontinence: Secondary | ICD-10-CM | POA: Diagnosis present

## 2023-12-28 DIAGNOSIS — Z7902 Long term (current) use of antithrombotics/antiplatelets: Secondary | ICD-10-CM

## 2023-12-28 LAB — CREATININE, SERUM
Creatinine, Ser: 1.3 mg/dL — ABNORMAL HIGH (ref 0.44–1.00)
GFR, Estimated: 45 mL/min — ABNORMAL LOW (ref >60)

## 2023-12-28 LAB — CBC
HCT: 34.5 % — ABNORMAL LOW (ref 36.0–46.0)
Hemoglobin: 11.7 g/dL — ABNORMAL LOW (ref 12.0–15.0)
MCH: 31.1 pg (ref 26.0–34.0)
MCHC: 33.9 g/dL (ref 30.0–36.0)
MCV: 91.8 fL (ref 80.0–100.0)
Platelets: 207 10*3/uL (ref 150–400)
RBC: 3.76 MIL/uL — ABNORMAL LOW (ref 3.87–5.11)
RDW: 11.7 % (ref 11.5–15.5)
WBC: 5 10*3/uL (ref 4.0–10.5)
nRBC: 0 % (ref 0.0–0.2)

## 2023-12-28 LAB — GLUCOSE, CAPILLARY
Glucose-Capillary: 173 mg/dL — ABNORMAL HIGH (ref 70–99)
Glucose-Capillary: 212 mg/dL — ABNORMAL HIGH (ref 70–99)
Glucose-Capillary: 112 mg/dL — ABNORMAL HIGH (ref 70–99)
Glucose-Capillary: 144 mg/dL — ABNORMAL HIGH (ref 70–99)
Glucose-Capillary: 166 mg/dL — ABNORMAL HIGH (ref 70–99)

## 2023-12-28 MED ORDER — ASPIRIN 81 MG PO TBEC
81.0000 mg | DELAYED_RELEASE_TABLET | Freq: Every day | ORAL | Status: DC
  Administered 2023-12-29 – 2024-01-18 (×21): 81 mg via ORAL
  Filled 2023-12-28 (×21): qty 1

## 2023-12-28 MED ORDER — INSULIN GLARGINE 100 UNIT/ML ~~LOC~~ SOLN: 10.0000 [IU] | Freq: Every day | SUBCUTANEOUS | 0 refills | Status: DC

## 2023-12-28 MED ORDER — ASPIRIN 81 MG PO TBEC: 81.0000 mg | DELAYED_RELEASE_TABLET | Freq: Every day | ORAL | 0 refills | Status: AC

## 2023-12-28 MED ORDER — PANTOPRAZOLE SODIUM 40 MG PO TBEC
40.0000 mg | DELAYED_RELEASE_TABLET | Freq: Every day | ORAL | Status: DC
Start: 2023-12-29 — End: 2024-01-18
  Administered 2023-12-29 – 2024-01-18 (×21): 40 mg via ORAL
  Filled 2023-12-28 (×22): qty 1

## 2023-12-28 MED ORDER — ACETAMINOPHEN 160 MG/5ML PO SOLN: 650.0000 mg | ORAL | Status: DC | PRN

## 2023-12-28 MED ORDER — SENNOSIDES-DOCUSATE SODIUM 8.6-50 MG PO TABS: 1.0000 | ORAL_TABLET | Freq: Every evening | ORAL | Status: DC | PRN

## 2023-12-28 MED ORDER — ASPIRIN 81 MG PO TBEC
81.0000 mg | DELAYED_RELEASE_TABLET | Freq: Every day | ORAL | Status: DC
Start: 2023-12-28 — End: 2023-12-28

## 2023-12-28 MED ORDER — CLONAZEPAM 0.5 MG PO TABS
1.0000 mg | ORAL_TABLET | Freq: Every day | ORAL | Status: DC
  Administered 2023-12-28 – 2024-01-17 (×21): 1 mg via ORAL
  Filled 2023-12-28 (×21): qty 2

## 2023-12-28 MED ORDER — CLOPIDOGREL BISULFATE 75 MG PO TABS: 75.0000 mg | ORAL_TABLET | Freq: Every day | ORAL | 0 refills | Status: DC

## 2023-12-28 MED ORDER — ACETAMINOPHEN 325 MG PO TABS
650.0000 mg | ORAL_TABLET | ORAL | Status: DC | PRN
  Administered 2023-12-28 – 2024-01-04 (×10): 650 mg via ORAL
  Filled 2023-12-28 (×11): qty 2

## 2023-12-28 MED ORDER — POLYETHYLENE GLYCOL 3350 17 G PO PACK
17.0000 g | PACK | Freq: Every day | ORAL | Status: DC
  Administered 2023-12-29 – 2024-01-11 (×12): 17 g via ORAL
  Filled 2023-12-28 (×16): qty 1

## 2023-12-28 MED ORDER — VENLAFAXINE HCL ER 37.5 MG PO CP24
37.5000 mg | ORAL_CAPSULE | Freq: Every day | ORAL | Status: DC
  Filled 2023-12-28: qty 1

## 2023-12-28 MED ORDER — ENOXAPARIN SODIUM 40 MG/0.4ML IJ SOSY
40.0000 mg | PREFILLED_SYRINGE | INTRAMUSCULAR | Status: DC
  Administered 2023-12-28 – 2024-01-17 (×21): 40 mg via SUBCUTANEOUS
  Filled 2023-12-28 (×21): qty 0.4

## 2023-12-28 MED ORDER — INSULIN ASPART 100 UNIT/ML IJ SOLN
5.0000 [IU] | Freq: Three times a day (TID) | INTRAMUSCULAR | Status: DC
Start: 2023-12-28 — End: 2024-01-04
  Administered 2023-12-28 – 2024-01-04 (×18): 5 [IU] via SUBCUTANEOUS

## 2023-12-28 MED ORDER — INSULIN ASPART 100 UNIT/ML IJ SOLN
0.0000 [IU] | Freq: Three times a day (TID) | INTRAMUSCULAR | Status: DC
  Administered 2023-12-28 – 2023-12-29 (×3): 2 [IU] via SUBCUTANEOUS
  Administered 2023-12-29 – 2023-12-30 (×3): 3 [IU] via SUBCUTANEOUS
  Administered 2023-12-31 (×2): 2 [IU] via SUBCUTANEOUS
  Administered 2023-12-31 – 2024-01-01 (×2): 3 [IU] via SUBCUTANEOUS
  Administered 2024-01-01: 5 [IU] via SUBCUTANEOUS
  Administered 2024-01-02 – 2024-01-03 (×4): 3 [IU] via SUBCUTANEOUS
  Administered 2024-01-03: 2 [IU] via SUBCUTANEOUS
  Administered 2024-01-04: 3 [IU] via SUBCUTANEOUS
  Administered 2024-01-05: 2 [IU] via SUBCUTANEOUS
  Administered 2024-01-05: 3 [IU] via SUBCUTANEOUS
  Administered 2024-01-06: 5 [IU] via SUBCUTANEOUS
  Administered 2024-01-06 – 2024-01-07 (×3): 3 [IU] via SUBCUTANEOUS
  Administered 2024-01-08: 2 [IU] via SUBCUTANEOUS

## 2023-12-28 MED ORDER — ACETAMINOPHEN 650 MG RE SUPP: 650.0000 mg | RECTAL | Status: DC | PRN

## 2023-12-28 MED ORDER — ROSUVASTATIN CALCIUM 20 MG PO TABS: 20.0000 mg | ORAL_TABLET | Freq: Every day | ORAL | Status: DC

## 2023-12-28 MED ORDER — CLOPIDOGREL BISULFATE 75 MG PO TABS
75.0000 mg | ORAL_TABLET | Freq: Every day | ORAL | Status: DC
  Administered 2023-12-29 – 2024-01-18 (×21): 75 mg via ORAL
  Filled 2023-12-28 (×21): qty 1

## 2023-12-28 MED ORDER — VENLAFAXINE HCL ER 37.5 MG PO CP24
37.5000 mg | ORAL_CAPSULE | Freq: Every day | ORAL | Status: DC
  Administered 2023-12-29 – 2024-01-18 (×21): 37.5 mg via ORAL
  Filled 2023-12-28 (×22): qty 1

## 2023-12-28 MED ORDER — ROSUVASTATIN CALCIUM 20 MG PO TABS
20.0000 mg | ORAL_TABLET | Freq: Every day | ORAL | Status: DC
  Administered 2023-12-29 – 2024-01-18 (×21): 20 mg via ORAL
  Filled 2023-12-28 (×21): qty 1

## 2023-12-28 MED ORDER — INSULIN GLARGINE-YFGN 100 UNIT/ML ~~LOC~~ SOLN
10.0000 [IU] | Freq: Every day | SUBCUTANEOUS | Status: DC
Start: 2023-12-28 — End: 2024-01-12
  Administered 2023-12-28 – 2024-01-12 (×16): 10 [IU] via SUBCUTANEOUS
  Filled 2023-12-28 (×16): qty 0.1

## 2023-12-28 MED ORDER — CLOPIDOGREL BISULFATE 75 MG PO TABS: 75.0000 mg | ORAL_TABLET | Freq: Every day | ORAL | Status: DC

## 2023-12-28 NOTE — Progress Notes (Signed)
 [facility] IP rehab - We received a call from insurance carrier saying that the denial has been overturned and patient now has been approved for inpatient rehab admission.  I will follow up with MD and team this morning to see if we can admit to CIR at [facility] today.  [phone]

## 2023-12-28 NOTE — Progress Notes (Signed)
 Inpatient Rehabilitation Admission Medication Review by a Pharmacist  A complete drug regimen review was completed for this patient to identify any potential clinically significant medication issues.  High Risk Drug Classes Is patient taking? Indication by Medication  Antipsychotic No   Anticoagulant Yes Lovenox  - VTE ppx  Antibiotic No   Opioid No   Antiplatelet Yes Clopidogrel, aspirin  x 3 months then clopidogrel monotherapy - CVA  Hypoglycemics/insulin  Yes Insulin  - DM  Vasoactive Medication No   Chemotherapy No   Other Yes Clonazepam  - mood, anxiety Pantoprazole  - reflux Rosuvastatin  - HLD Venlafaxine  - mood     Type of Medication Issue Identified Description of Issue Recommendation(s)  Drug Interaction(s) (clinically significant)     Duplicate Therapy     Allergy     No Medication Administration End Date     Incorrect Dose     Additional Drug Therapy Needed     Significant med changes from prior encounter (inform family/care partners about these prior to discharge).    Other       Clinically significant medication issues were identified that warrant physician communication and completion of prescribed/recommended actions by midnight of the next day:  No  Name of provider notified for urgent issues identified:   Provider Method of Notification:     Pharmacist comments: PO DM medications prior to admission - A1c 8.4  Time spent performing this drug regimen review (minutes):  20 minutes  Thank you Lennice Quivers, PharmD

## 2023-12-28 NOTE — Discharge Summary (Signed)
 Physician Discharge Summary   Patient: Haley Jones MRN: [id number] DOB: [birth year]  Admit date:     12/20/2023  Discharge date: 12/28/23  Discharge Physician: Donaciano Frizzle   PCP: Sari Cunning, MD   Recommendations at discharge:   Follow-up with PCP in 1 week. Follow-up with neurology in 1 month. Continue aspirin , Plavix for 90 days, followed by aspirin  treatment.  Discharge Diagnoses: Principal Problem:   Acute CVA (cerebrovascular accident) [facility]) Active Problems:   Acute kidney injury superimposed on chronic kidney disease (HCC)   B12 deficiency   DM type 2 with diabetic mixed hyperlipidemia (HCC)   Primary hypertension  Resolved Problems:   * No resolved hospital problems. *  Hospital Course: Haley Jones is a 68 y.o. female with medical history significant of Hypertension, B12 deficiency, type 2 diabetes, hyperlipidemia, CKD stage IIIa, who presented to the hospital with right-sided weakness.  She also reported a 2 to 3-week history of dizziness and she has been falling more often.  She reported this to her PCP and she was started on meclizine.  Her sister came to visit her a day prior to admission and she noticed that she was not moving her right hand well  MRI showed:  1. Patchy acute cortical/subcortical infarcts within the left parietal and occipital lobes individually measuring up to 3 cm (MCA vascular territory). Subtle petechial hemorrhage associated with some of these infarcts. No frank hemorrhagic conversion. 2. Additional punctate left MCA territory cortical acute infarct within the left insula.  CT angiogram showed severe stenosis or nonocclusive thrombus at the left MCA bifurcation affecting the inferior division.  Patient is seen by neurology, left MCA stenosis does not require intervention.  Patient is started on aspirin , Plavix, and Crestor .  Patient has been approved for rehab.  Assessment and Plan: Acute left MCA stroke:  Left MCA  occlusion. Neurologist recommended dual antiplatelet therapy with low-dose aspirin  and Plavix for 90 days followed by Plavix monotherapy.  Continue Crestor . 2D echo showed EF estimated at 60 to 65%, grade 1 diastolic dysfunction.  No interatrial shunt. Patient has been accepted to acute rehab hospital, will charged today.    AKI on CKD stage IIIa:  Hypokalemia. Renal function has improved to baseline.   Hypertension:  Continue lisinopril  and amlodipine     Uncontrolled type II DM with hyperglycemia: Hemoglobin A1c was 8.4 on 11/26/2023 Resume home treatment, follow-up with the PCP as outpatient. Added 10 units of long-acting insulin .    Hyperlipidemia: Continue Crestor      Vitamin B12 deficiency:  Follow-up with PCP for B12 injection.     Comorbidities include chronic back pain, anxiety, depression, chronic sinusitis       Consultants: Neurology Procedures performed: None  Disposition: acute rehab Diet recommendation:  Discharge Diet Orders (From admission, onward)     Start     Ordered   12/28/23 0000  Diet - low sodium heart healthy        12/28/23 0950            DISCHARGE MEDICATION: Allergies as of 12/28/2023       Reactions   Etodolac Other (See Comments)   Made her feel "loopy"   Hydrocodone-acetaminophen  Nausea And Vomiting, Nausea Only   Orphenadrine Citrate Other (See Comments)   Made her feel "loopy". (Norflex) muscle relaxant   Penicillins Rash   Has patient had a PCN reaction causing immediate rash, facial/tongue/throat swelling, SOB or lightheadedness with hypotension: Unknown Has patient had a PCN reaction causing severe  rash involving mucus membranes or skin necrosis: Unknown Has patient had a PCN reaction that required hospitalization: No Has patient had a PCN reaction occurring within the last 10 years: No Childhood reaction If all of the above answers are "NO", then may proceed with Cephalosporin use.   Tramadol Other (See Comments)    Makes her feel very tired and loopy        Medication List     STOP taking these medications    glimepiride  1 MG tablet Commonly known as: AMARYL    glimepiride  2 MG tablet Commonly known as: Amaryl    glipiZIDE 10 MG 24 hr tablet Commonly known as: GLUCOTROL XL   simvastatin 20 MG tablet Commonly known as: ZOCOR       TAKE these medications    amLODipine  5 MG tablet Commonly known as: NORVASC  Take 1 tablet (5 mg total) by mouth daily.   aspirin  EC 81 MG tablet Take 1 tablet (81 mg total) by mouth daily. Swallow whole. Start taking on: Dec 29, 2023   Calcium  600+D 600-400 MG-UNIT tablet Generic drug: Calcium  Carbonate-Vitamin D Take 2 tablets by mouth daily.   clonazePAM  1 MG tablet Commonly known as: KLONOPIN  Take 1 mg by mouth at bedtime.   clopidogrel 75 MG tablet Commonly known as: PLAVIX Take 1 tablet (75 mg total) by mouth daily. Start taking on: Dec 29, 2023   estradiol 2 MG tablet Commonly known as: ESTRACE Take 2 mg by mouth daily.   insulin  glargine 100 UNIT/ML injection Commonly known as: LANTUS Inject 0.1 mLs (10 Units total) into the skin daily.   Jardiance 10 MG Tabs tablet Generic drug: empagliflozin Take 10 mg by mouth daily.   lisinopril  10 MG tablet Commonly known as: ZESTRIL  Take 10 mg by mouth daily.   meclizine 12.5 MG tablet Commonly known as: ANTIVERT Take 12.5 mg by mouth 2 (two) times daily.   metFORMIN 500 MG tablet Commonly known as: GLUCOPHAGE Take 500-1,000 mg by mouth See admin instructions. Take 1 tablet (500 mg) in the morning & take 2 tablets (1000 mg) by mouth in the evening.   multivitamin with minerals Tabs tablet Take 1 tablet by mouth daily.   pantoprazole  40 MG tablet Commonly known as: PROTONIX  Take 40 mg by mouth daily before breakfast.   rosuvastatin  20 MG tablet Commonly known as: CRESTOR  Take 20 mg by mouth.   sertraline 50 MG tablet Commonly known as: ZOLOFT Take 50 mg by mouth daily.    Trulicity  0.75 MG/0.5ML Soaj Generic drug: Dulaglutide  Inject 0.75 mg into the skin once a week.   TYLENOL  PM EXTRA STRENGTH PO Take 2 tablets by mouth at bedtime.   venlafaxine  XR 37.5 MG 24 hr capsule Commonly known as: EFFEXOR -XR Take 1 capsule by mouth daily.        Follow-up Information     Sari Cunning, MD Follow up.   Specialty: Internal Medicine Why: Hospital follow up Contact information: [address] [facility] [place] [address] [phone]         [facility] Follow up in 1 month(s).   Contact information: [address] [place]  [address] [phone]               Discharge Exam: There were no vitals filed for this visit. General exam: Appears calm and comfortable  Respiratory system: Clear to auscultation. Respiratory effort normal. Cardiovascular system: S1 & S2 heard, RRR. No JVD, murmurs, rubs, gallops  or clicks. No pedal edema. Gastrointestinal system: Abdomen is nondistended, soft and nontender. No organomegaly or masses felt. Normal bowel sounds heard. Central nervous system: Alert and oriented.  Right arm weakness. Extremities: Symmetric 5 x 5 power. Skin: No rashes, lesions or ulcers Psychiatry: Judgement and insight appear normal. Mood & affect appropriate.    Condition at discharge: good  The results of significant diagnostics from this hospitalization (including imaging, microbiology, ancillary and laboratory) are listed below for reference.   Imaging Studies: ECHOCARDIOGRAM COMPLETE Result Date: 12/20/2023    ECHOCARDIOGRAM REPORT   Patient Name:   Haley Jones Date of Exam: 12/20/2023 Medical Rec #:  [id number]             Height:       57.0 in Accession #:    [id number]            Weight:       155.0 lb Date of Birth:  1956-05-02             BSA:          1.614 m Patient Age:    68 years              BP:           119/98 mmHg Patient  Gender: F                     HR:           82 bpm. Exam Location:  [facility] Procedure: 2D Echo (Both Spectral and Color Flow Doppler were utilized during            procedure). Indications:     stroke  History:         Patient has no prior history of Echocardiogram examinations.                  Risk Factors:Diabetes and Hypertension.  Sonographer:     Dione Franks RDCS Referring Phys:  [id number] Avi Body Diagnosing Phys: Belva Boyden MD IMPRESSIONS  1. Left ventricular ejection fraction, by estimation, is 60 to 65%. The left ventricle has normal function. The left ventricle has no regional wall motion abnormalities. Left ventricular diastolic parameters are consistent with Grade I diastolic dysfunction (impaired relaxation).  2. Right ventricular systolic function is normal. The right ventricular size is normal.  3. The mitral valve is normal in structure. No evidence of mitral valve regurgitation. No evidence of mitral stenosis.  4. The aortic valve is normal in structure. Aortic valve regurgitation is not visualized. Aortic valve sclerosis is present, with no evidence of aortic valve stenosis.  5. The inferior vena cava is normal in size with greater than 50% respiratory variability, suggesting right atrial pressure of 3 mmHg. FINDINGS  Left Ventricle: Left ventricular ejection fraction, by estimation, is 60 to 65%. The left ventricle has normal function. The left ventricle has no regional wall motion abnormalities. Strain was performed and the global longitudinal strain is indeterminate. The left ventricular internal cavity size was normal in size. There is no left ventricular hypertrophy. Left ventricular diastolic parameters are consistent with Grade I diastolic dysfunction (impaired relaxation). Right Ventricle: The right ventricular size is normal. No increase in right ventricular wall thickness. Right ventricular systolic function is normal. Left Atrium: Left atrial size was normal in size. Right  Atrium: Right atrial size was normal in size. Pericardium: There is no evidence of pericardial effusion. Mitral Valve: The mitral valve is normal  in structure. No evidence of mitral valve regurgitation. No evidence of mitral valve stenosis. Tricuspid Valve: The tricuspid valve is normal in structure. Tricuspid valve regurgitation is not demonstrated. No evidence of tricuspid stenosis. Aortic Valve: The aortic valve is normal in structure. Aortic valve regurgitation is not visualized. Aortic valve sclerosis is present, with no evidence of aortic valve stenosis. Pulmonic Valve: The pulmonic valve was normal in structure. Pulmonic valve regurgitation is not visualized. No evidence of pulmonic stenosis. Aorta: The aortic root is normal in size and structure. Venous: The inferior vena cava is normal in size with greater than 50% respiratory variability, suggesting right atrial pressure of 3 mmHg. IAS/Shunts: No atrial level shunt detected by color flow Doppler. Additional Comments: 3D was performed not requiring image post processing on an independent workstation and was indeterminate.  LEFT VENTRICLE PLAX 2D LVIDd:         3.90 cm   Diastology LVIDs:         2.50 cm   LV e' medial:    9.46 cm/s LV PW:         1.10 cm   LV E/e' medial:  10.1 LV IVS:        1.00 cm   LV e' lateral:   10.90 cm/s LVOT diam:     1.70 cm   LV E/e' lateral: 8.8 LV SV:         49 LV SV Index:   30 LVOT Area:     2.27 cm  RIGHT VENTRICLE             IVC RV Basal diam:  2.60 cm     IVC diam: 1.70 cm RV S prime:     21.60 cm/s TAPSE (M-mode): 1.7 cm LEFT ATRIUM           Index        RIGHT ATRIUM          Index LA diam:      3.00 cm 1.86 cm/m   RA Area:     9.65 cm LA Vol (A4C): 31.8 ml 19.70 ml/m  RA Volume:   19.10 ml 11.83 ml/m  AORTIC VALVE LVOT Vmax:   119.00 cm/s LVOT Vmean:  79.900 cm/s LVOT VTI:    0.215 m  AORTA Ao Root diam: 3.00 cm Ao Asc diam:  3.20 cm MITRAL VALVE MV Area (PHT): 3.60 cm    SHUNTS MV Decel Time: 211 msec     Systemic VTI:  0.22 m MV E velocity: 95.60 cm/s  Systemic Diam: 1.70 cm MV A velocity: 97.10 cm/s MV E/A ratio:  0.98 Belva Boyden MD Electronically signed by Belva Boyden MD Signature Date/Time: 12/20/2023/6:28:29 PM    Final    CT ANGIO HEAD NECK W WO CM Result Date: 12/20/2023 CLINICAL DATA:  Neuro deficit, acute, stroke suspected. Worsening confusion. Falling and dizziness. EXAM: CT ANGIOGRAPHY HEAD AND NECK WITH AND WITHOUT CONTRAST TECHNIQUE: Multidetector CT imaging of the head and neck was performed using the standard protocol during bolus administration of intravenous contrast. Multiplanar CT image reconstructions and MIPs were obtained to evaluate the vascular anatomy. Carotid stenosis measurements (when applicable) are obtained utilizing NASCET criteria, using the distal internal carotid diameter as the denominator. RADIATION DOSE REDUCTION: This exam was performed according to the departmental dose-optimization program which includes automated exposure control, adjustment of the mA and/or kV according to patient size and/or use of iterative reconstruction technique. CONTRAST:  60mL OMNIPAQUE  IOHEXOL  350 MG/ML SOLN COMPARISON:  MRI  earlier same day FINDINGS: CT HEAD FINDINGS Brain: No focal abnormality seen affecting the brainstem or cerebellum. Cerebral hemispheres show moderate chronic small-vessel ischemic changes of the white matter. Acute infarction evident affecting several adjacent gyri in the left parietal region. Mild swelling but no mass effect or hemorrhage. No mass, hydrocephalus or extra-axial collection. Vascular: There is atherosclerotic calcification of the major vessels at the base of the brain. Skull: Negative Sinuses/Orbits: Opacified right maxillary sinus.  Orbits negative. Other: None Review of the MIP images confirms the above findings CTA NECK FINDINGS Aortic arch: Aortic atherosclerosis. Branching pattern is normal without origin stenosis. Right carotid system: Common carotid  artery widely patent to the bifurcation. Mild plaque at the bifurcation but no stenosis. Cervical ICA widely patent. Left carotid system: Common carotid artery shows soft plaque in its midportion with stenosis of 25%. Beyond that the vessel is widely patent to the bifurcation. Normal bifurcation. Cervical ICA is normal. Vertebral arteries: No proximal subclavian stenosis. Both vertebral artery origins are widely patent. Vertebral arteries are normal through the cervical region to the foramen magnum. Skeleton: Ordinary cervical spondylosis. Other neck: No mass or lymphadenopathy. Upper chest: Lung apices are clear. Review of the MIP images confirms the above findings CTA HEAD FINDINGS Anterior circulation: Both internal carotid arteries are patent through the skull base and siphon regions. Patent posterior communicating artery on the right. The right anterior and middle cerebral vessels are patent. There is severe stenosis or nonocclusive thrombus at the left MCA bifurcation affecting the inferior division. Nonocclusive embolus is visible within a left parietal branch as well. This would be M4. Posterior circulation: Both vertebral arteries are patent through the foramen magnum to the basilar artery. No basilar stenosis. Posterior circulation branch vessels are patent. Some atherosclerotic irregularity of the more distal PCA branches. Venous sinuses: Patent and normal. Anatomic variants: None significant. Review of the MIP images confirms the above findings IMPRESSION: 1. Acute infarction affecting several adjacent gyri in the left parietal region. Mild swelling but no mass effect or hemorrhage. 2. Severe stenosis or nonocclusive thrombus at the left MCA bifurcation affecting the inferior division. Nonocclusive embolus is visible within a left parietal branch as well. This would be M4. 3. Aortic atherosclerosis. 4. Soft plaque in the midportion of the left common carotid artery with stenosis of 25%. 5. Mild  atherosclerotic change at the right carotid bifurcation but no stenosis. Aortic Atherosclerosis (ICD10-I70.0). Electronically Signed   By: Bettylou Brunner M.D.   On: 12/20/2023 14:52   MR BRAIN WO CONTRAST Result Date: 12/20/2023 CLINICAL DATA:  Provided history: Stroke, follow-up. Additional history provided: Vision changes, difficulty walking. EXAM: MRI HEAD WITHOUT CONTRAST TECHNIQUE: Multiplanar, multiecho pulse sequences of the brain and surrounding structures were obtained without intravenous contrast. COMPARISON:  Brain MRI 07/31/2022. FINDINGS: Brain: Mild generalized cerebral atrophy. Patchy acute cortical/subcortical infarcts within the left parietal and occipital lobes (MCA vascular territory). The largest individual infarct (located within the left parietal lobe) spans 3 cm. Subtle petechial hemorrhage associated with some of these infarcts. Additional punctate left MCA territory cortical acute infarct within the left insula. Chronic lacunar infarcts within the bilateral cerebral hemispheric white matter, some of which are new from the prior brain MRI of 07/31/2022. Prominent perivascular spaces within the basal ganglia. Chronic lacunar infarct within the left aspect of the pons, new from the prior MRI. Background moderate multifocal T2 FLAIR hyperintense signal abnormality within the cerebral white matter and pons, nonspecific but compatible with chronic small vessel ischemic disease. No evidence  of an intracranial mass. No extra-axial fluid collection. No midline shift. Vascular: Maintained flow voids within the proximal large arterial vessels. Skull and upper cervical spine: No focal worrisome marrow lesion. Incompletely assessed cervical spondylosis. Mild grade 1 anterolisthesis at C3-C4 and C4-C5. Sinuses/Orbits: No mass or acute finding within the imaged orbits. Severe right maxillary sinusitis (with near complete sinus opacification). Mild mucosal thickening within the left sphenoid sinus. Mild  right ethmoid sinusitis. IMPRESSION: 1. Patchy acute cortical/subcortical infarcts within the left parietal and occipital lobes individually measuring up to 3 cm (MCA vascular territory). Subtle petechial hemorrhage associated with some of these infarcts. No frank hemorrhagic conversion. 2. Additional punctate left MCA territory cortical acute infarct within the left insula. 3. Background parenchymal atrophy and chronic small vessel ischemic disease with chronic lacunar infarcts, as described. 4. Paranasal sinus disease as outlined (including severe right maxillary sinusitis). Electronically Signed   By: Bascom Lily D.O.   On: 12/20/2023 11:52    Microbiology: Results for orders placed or performed during the hospital encounter of 07/31/22  Resp panel by RT-PCR (RSV, Flu A&B, Covid) Anterior Nasal Swab     Status: None   Collection Time: 07/31/22  6:32 PM   Specimen: Anterior Nasal Swab  Result Value Ref Range Status   SARS Coronavirus 2 by RT PCR NEGATIVE NEGATIVE Final    Comment: (NOTE) SARS-CoV-2 target nucleic acids are NOT DETECTED.  The SARS-CoV-2 RNA is generally detectable in upper respiratory specimens during the acute phase of infection. The lowest concentration of SARS-CoV-2 viral copies this assay can detect is 138 copies/mL. A negative result does not preclude SARS-Cov-2 infection and should not be used as the sole basis for treatment or other patient management decisions. A negative result may occur with  improper specimen collection/handling, submission of specimen other than nasopharyngeal swab, presence of viral mutation(s) within the areas targeted by this assay, and inadequate number of viral copies(<138 copies/mL). A negative result must be combined with clinical observations, patient history, and epidemiological information. The expected result is Negative.  Fact Sheet for Patients:  BloggerCourse.com  Fact Sheet for Healthcare Providers:   SeriousBroker.it  This test is no t yet approved or cleared by the [place]  FDA and  has been authorized for detection and/or diagnosis of SARS-CoV-2 by FDA under an Emergency Use Authorization (EUA). This EUA will remain  in effect (meaning this test can be used) for the duration of the COVID-19 declaration under Section 564(b)(1) of the Act, 21 U.S.C.section 360bbb-3(b)(1), unless the authorization is terminated  or revoked sooner.       Influenza A by PCR NEGATIVE NEGATIVE Final   Influenza B by PCR NEGATIVE NEGATIVE Final    Comment: (NOTE) The Xpert Xpress SARS-CoV-2/FLU/RSV plus assay is intended as an aid in the diagnosis of influenza from Nasopharyngeal swab specimens and should not be used as a sole basis for treatment. Nasal washings and aspirates are unacceptable for Xpert Xpress SARS-CoV-2/FLU/RSV testing.  Fact Sheet for Patients: BloggerCourse.com  Fact Sheet for Healthcare Providers: SeriousBroker.it  This test is not yet approved or cleared by the [place]  FDA and has been authorized for detection and/or diagnosis of SARS-CoV-2 by FDA under an Emergency Use Authorization (EUA). This EUA will remain in effect (meaning this test can be used) for the duration of the COVID-19 declaration under Section 564(b)(1) of the Act, 21 U.S.C. section 360bbb-3(b)(1), unless the authorization is terminated or revoked.     Resp Syncytial Virus by PCR NEGATIVE NEGATIVE Final  Comment: (NOTE) Fact Sheet for Patients: BloggerCourse.com  Fact Sheet for Healthcare Providers: SeriousBroker.it  This test is not yet approved or cleared by the [place]  FDA and has been authorized for detection and/or diagnosis of SARS-CoV-2 by FDA under an Emergency Use Authorization (EUA). This EUA will remain in effect (meaning this test can be used) for  the duration of the COVID-19 declaration under Section 564(b)(1) of the Act, 21 U.S.C. section 360bbb-3(b)(1), unless the authorization is terminated or revoked.  Performed at [facility], [address] [place], [place] [address]     Labs: CBC: No results for input(s): "WBC", "NEUTROABS", "HGB", "HCT", "MCV", "PLT" in the last 168 hours. Basic Metabolic Panel: Recent Labs  Lab 12/22/23 0643  NA 136  K 3.6  CL 105  CO2 23  GLUCOSE 189*  BUN 17  CREATININE 1.19*  CALCIUM  8.7*   Liver Function Tests: No results for input(s): "AST", "ALT", "ALKPHOS", "BILITOT", "PROT", "ALBUMIN" in the last 168 hours. CBG: Recent Labs  Lab 12/27/23 1201 12/27/23 1647 12/27/23 2013 12/28/23 0723 12/28/23 0901  GLUCAP 231* 82 260* 173* 212*    Discharge time spent: greater than 30 minutes.  Signed: Donaciano Frizzle, MD [facility] 12/28/2023

## 2023-12-28 NOTE — TOC Transition Note (Signed)
 Transition of Care [facility]) - Discharge Note   Patient Details  Name: Haley Jones MRN: [id number] Date of Birth: 10-Jun-1956  Transition of Care [facility]) CM/SW Contact:  Crayton Docker, RN 12/28/2023, 10:05 AM   Clinical Narrative:     Discharge orders, discharge summary noted for admission to CIR today.   Final next level of care: IP Rehab Facility Barriers to Discharge: No Barriers Identified   Patient Goals and CMS Choice    Inpatient Rehab   Discharge Placement    Inpatient Rehab            Discharge Plan and Services Additional resources added to the After Visit Summary for      Social Drivers of Health (SDOH) Interventions SDOH Screenings   Food Insecurity: No Food Insecurity (12/20/2023)  Housing: Low Risk  (12/20/2023)  Transportation Needs: No Transportation Needs (12/20/2023)  Utilities: Not At Risk (12/20/2023)  Social Connections: Moderately Isolated (12/20/2023)  Tobacco Use: Low Risk  (12/20/2023)     Readmission Risk Interventions     No data to display

## 2023-12-28 NOTE — Plan of Care (Signed)
 Problem: Education: Goal: Knowledge of disease or condition will improve 12/28/2023 0314 by Orene Binning, RN Outcome: Progressing 12/28/2023 0250 by Orene Binning, RN Outcome: Progressing Goal: Knowledge of secondary prevention will improve (MUST DOCUMENT ALL) 12/28/2023 0314 by Orene Binning, RN Outcome: Progressing 12/28/2023 0250 by Orene Binning, RN Outcome: Progressing Goal: Knowledge of patient specific risk factors will improve (DELETE if not current risk factor) 12/28/2023 0314 by Orene Binning, RN Outcome: Progressing 12/28/2023 0250 by Orene Binning, RN Outcome: Progressing   Problem: Ischemic Stroke/TIA Tissue Perfusion: Goal: Complications of ischemic stroke/TIA will be minimized 12/28/2023 0314 by Orene Binning, RN Outcome: Progressing 12/28/2023 0250 by Orene Binning, RN Outcome: Progressing   Problem: Coping: Goal: Will verbalize positive feelings about self 12/28/2023 0314 by Orene Binning, RN Outcome: Progressing 12/28/2023 0250 by Orene Binning, RN Outcome: Progressing Goal: Will identify appropriate support needs 12/28/2023 0314 by Orene Binning, RN Outcome: Progressing 12/28/2023 0250 by Orene Binning, RN Outcome: Progressing   Problem: Health Behavior/Discharge Planning: Goal: Ability to manage health-related needs will improve 12/28/2023 0314 by Orene Binning, RN Outcome: Progressing 12/28/2023 0250 by Orene Binning, RN Outcome: Progressing Goal: Goals will be collaboratively established with patient/family 12/28/2023 0314 by Orene Binning, RN Outcome: Progressing 12/28/2023 0250 by Orene Binning, RN Outcome: Progressing   Problem: Self-Care: Goal: Ability to participate in self-care as condition permits will improve 12/28/2023 0314 by Orene Binning, RN Outcome: Progressing 12/28/2023 0250 by Orene Binning, RN Outcome: Progressing Goal: Verbalization of feelings and concerns over difficulty with self-care will improve 12/28/2023 0314 by  Orene Binning, RN Outcome: Progressing 12/28/2023 0250 by Orene Binning, RN Outcome: Progressing Goal: Ability to communicate needs accurately will improve 12/28/2023 0314 by Orene Binning, RN Outcome: Progressing 12/28/2023 0250 by Orene Binning, RN Outcome: Progressing   Problem: Nutrition: Goal: Risk of aspiration will decrease 12/28/2023 0314 by Orene Binning, RN Outcome: Progressing 12/28/2023 0250 by Orene Binning, RN Outcome: Progressing Goal: Dietary intake will improve 12/28/2023 0314 by Orene Binning, RN Outcome: Progressing 12/28/2023 0250 by Orene Binning, RN Outcome: Progressing   Problem: Education: Goal: Knowledge of General Education information will improve Description: Including pain rating scale, medication(s)/side effects and non-pharmacologic comfort measures 12/28/2023 0314 by Orene Binning, RN Outcome: Progressing 12/28/2023 0250 by Orene Binning, RN Outcome: Progressing   Problem: Health Behavior/Discharge Planning: Goal: Ability to manage health-related needs will improve 12/28/2023 0314 by Orene Binning, RN Outcome: Progressing 12/28/2023 0250 by Orene Binning, RN Outcome: Progressing   Problem: Clinical Measurements: Goal: Ability to maintain clinical measurements within normal limits will improve 12/28/2023 0314 by Orene Binning, RN Outcome: Progressing 12/28/2023 0250 by Orene Binning, RN Outcome: Progressing Goal: Will remain free from infection 12/28/2023 0314 by Orene Binning, RN Outcome: Progressing 12/28/2023 0250 by Orene Binning, RN Outcome: Progressing Goal: Diagnostic test results will improve 12/28/2023 0314 by Orene Binning, RN Outcome: Progressing 12/28/2023 0250 by Orene Binning, RN Outcome: Progressing Goal: Respiratory complications will improve 12/28/2023 0314 by Orene Binning, RN Outcome: Progressing 12/28/2023 0250 by Orene Binning, RN Outcome: Progressing Goal: Cardiovascular complication will be  avoided 12/28/2023 0314 by Orene Binning, RN Outcome: Progressing 12/28/2023 0250 by Orene Binning, RN Outcome: Progressing   Problem: Activity: Goal: Risk for activity intolerance will decrease 12/28/2023 0314 by Orene Binning, RN Outcome: Progressing 12/28/2023 0250 by  Orene Binning, RN Outcome: Progressing   Problem: Nutrition: Goal: Adequate nutrition will be maintained 12/28/2023 0314 by Orene Binning, RN Outcome: Progressing 12/28/2023 0250 by Orene Binning, RN Outcome: Progressing   Problem: Coping: Goal: Level of anxiety will decrease 12/28/2023 0314 by Orene Binning, RN Outcome: Progressing 12/28/2023 0250 by Orene Binning, RN Outcome: Progressing   Problem: Elimination: Goal: Will not experience complications related to bowel motility 12/28/2023 0314 by Orene Binning, RN Outcome: Progressing 12/28/2023 0250 by Orene Binning, RN Outcome: Progressing Goal: Will not experience complications related to urinary retention 12/28/2023 0314 by Orene Binning, RN Outcome: Progressing 12/28/2023 0250 by Orene Binning, RN Outcome: Progressing   Problem: Pain Managment: Goal: General experience of comfort will improve and/or be controlled 12/28/2023 0314 by Orene Binning, RN Outcome: Progressing 12/28/2023 0250 by Orene Binning, RN Outcome: Progressing   Problem: Safety: Goal: Ability to remain free from injury will improve 12/28/2023 0314 by Orene Binning, RN Outcome: Progressing 12/28/2023 0250 by Orene Binning, RN Outcome: Progressing   Problem: Skin Integrity: Goal: Risk for impaired skin integrity will decrease 12/28/2023 0314 by Orene Binning, RN Outcome: Progressing 12/28/2023 0250 by Orene Binning, RN Outcome: Progressing   Problem: Education: Goal: Ability to describe self-care measures that may prevent or decrease complications (Diabetes Survival Skills Education) will improve 12/28/2023 0314 by Orene Binning, RN Outcome: Progressing 12/28/2023  0250 by Orene Binning, RN Outcome: Progressing Goal: Individualized Educational Video(s) 12/28/2023 0314 by Orene Binning, RN Outcome: Progressing 12/28/2023 0250 by Orene Binning, RN Outcome: Progressing   Problem: Coping: Goal: Ability to adjust to condition or change in health will improve 12/28/2023 0314 by Orene Binning, RN Outcome: Progressing 12/28/2023 0250 by Orene Binning, RN Outcome: Progressing   Problem: Fluid Volume: Goal: Ability to maintain a balanced intake and output will improve 12/28/2023 0314 by Orene Binning, RN Outcome: Progressing 12/28/2023 0250 by Orene Binning, RN Outcome: Progressing   Problem: Health Behavior/Discharge Planning: Goal: Ability to identify and utilize available resources and services will improve 12/28/2023 0314 by Orene Binning, RN Outcome: Progressing 12/28/2023 0250 by Orene Binning, RN Outcome: Progressing Goal: Ability to manage health-related needs will improve 12/28/2023 0314 by Orene Binning, RN Outcome: Progressing 12/28/2023 0250 by Orene Binning, RN Outcome: Progressing   Problem: Metabolic: Goal: Ability to maintain appropriate glucose levels will improve 12/28/2023 0314 by Orene Binning, RN Outcome: Progressing 12/28/2023 0250 by Orene Binning, RN Outcome: Progressing   Problem: Nutritional: Goal: Maintenance of adequate nutrition will improve 12/28/2023 0314 by Orene Binning, RN Outcome: Progressing 12/28/2023 0250 by Orene Binning, RN Outcome: Progressing Goal: Progress toward achieving an optimal weight will improve 12/28/2023 0314 by Orene Binning, RN Outcome: Progressing 12/28/2023 0250 by Orene Binning, RN Outcome: Progressing   Problem: Skin Integrity: Goal: Risk for impaired skin integrity will decrease 12/28/2023 0314 by Orene Binning, RN Outcome: Progressing 12/28/2023 0250 by Orene Binning, RN Outcome: Progressing   Problem: Tissue Perfusion: Goal: Adequacy of tissue perfusion will  improve 12/28/2023 0314 by Orene Binning, RN Outcome: Progressing 12/28/2023 0250 by Orene Binning, RN Outcome: Progressing

## 2023-12-28 NOTE — H&P (Addendum)
 Physical Medicine and Rehabilitation Admission H&P    No chief complaint on file. : HPI: Haley Jones is a 68 year old left-handed female with history significant for hypertension, B12 deficiency, type 2 diabetes mellitus, hyperlipidemia, CKD stage III, depression.  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  She reports her daughter passed away on the day he was hospitalized.  She has several family members in the area who can provide assistance.  1 level home with ramped entrance.  Presented to [facility] 12/20/2023 with  right side weakness/falls and dizziness.  By report she had had dizziness for the last 2 to 3 weeks she did see her PCP and was started on meclizine.  MRI of the brain showed patchy acute cortical/subcortical infarcts within the left parietal occipital lobes individually measuring up to 3 cm (MCA vascular territory).  Subtle petechial hemorrhage associated with some of these infarcts.  No frank hemorrhagic conversion.  Additional punctate left MCA territory cortical acute infarct within the left insula.  Background parenchymal atrophy and chronic small vessel ischemic disease with chronic lacunar infarcts.  CT angio showed severe stenosis/nonocclusive thrombus of the left MCA bifurcation affecting the inferior division.  Nonocclusive embolus also visible within the left parietal branch as well.  Admission chemistries unremarkable except glucose 262 creatinine 1.61, and latest hemoglobin A1c 8.4.  Echocardiogram ejection fraction of 60 to 65% no wall motion abnormalities grade 1 diastolic dysfunction.  Neurology follow-up placed on aspirin  81 mg daily and Plavix 75 mg daily x 90 days then Plavix alone.  Lovenox  added for DVT prophylaxis.  Patient initially with permissive hypertension with Norvasc  and lisinopril  slowly to be resumed.  Tolerating a regular consistency diet.  Therapy evaluations completed due to patient's right side weakness and  decreased functional mobility was admitted for a comprehensive rehab program  Review of Systems  Constitutional:  Negative for chills and fever.  HENT:  Negative for hearing loss.   Eyes:  Positive for blurred vision. Negative for double vision.  Respiratory:  Negative for cough, shortness of breath and wheezing.   Cardiovascular:  Negative for chest pain, palpitations and leg swelling.  Gastrointestinal:  Positive for constipation. Negative for heartburn, nausea and vomiting.       GERD  Genitourinary:  Negative for dysuria, flank pain and hematuria.  Musculoskeletal:  Positive for back pain and falls.  Skin:  Negative for rash.  Neurological:  Positive for weakness. Negative for sensory change, speech change and headaches.  Psychiatric/Behavioral:  Positive for depression.        Anxiety  All other systems reviewed and are negative.  Past Medical History:  Diagnosis Date   Adnexal mass    Anxiety    Chronic back pain    Depression    Diabetes mellitus without complication (HCC)    Endometriosis 03/12/2018   GERD (gastroesophageal reflux disease)    Heart murmur 02/2018   undetected until she was an adult. 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   Weight loss 12/06/2017   Past Surgical History:  Procedure Laterality Date   ABDOMINAL HYSTERECTOMY  2008   ovaries also   CESAREAN SECTION  1994   COLONOSCOPY     COLONOSCOPY WITH PROPOFOL  N/A 02/12/2018   Procedure: COLONOSCOPY WITH PROPOFOL ;  Surgeon: Toledo, Alphonsus Jeans, MD;  Location: [facility];  Service: Gastroenterology;  Laterality: N/A;  ESOPHAGOGASTRODUODENOSCOPY (EGD) WITH PROPOFOL  N/A 02/12/2018   Procedure: ESOPHAGOGASTRODUODENOSCOPY (EGD) WITH PROPOFOL ;  Surgeon: Toledo, Alphonsus Jeans, MD;  Location: [facility];  Service: Gastroenterology;  Laterality: N/A;   EYE SURGERY Left 1973   muscle 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 She has never used smokeless tobacco. She reports that she does not drink alcohol and does not use drugs. Allergies:  Allergies  Allergen Reactions   Etodolac Other (See Comments)    Made her feel "loopy"   Hydrocodone-Acetaminophen  Nausea And Vomiting and Nausea Only   Orphenadrine Citrate Other (See Comments)    Made her feel "loopy". (Norflex) muscle relaxant   Penicillins Rash    Has patient had a PCN reaction causing immediate rash, facial/tongue/throat swelling, SOB or lightheadedness with hypotension: Unknown Has patient had a PCN reaction causing severe rash involving mucus membranes or skin necrosis: Unknown Has patient had a PCN reaction that required hospitalization: No Has patient had a PCN reaction occurring within the last 10 years: No Childhood reaction If all of the above answers are "NO", then may proceed with Cephalosporin use.    Tramadol Other (See Comments)    Makes her feel very tired and loopy   Medications Prior to Admission  Medication Sig Dispense Refill   amLODipine  (NORVASC ) 5 MG tablet Take 1 tablet (5 mg total) by mouth daily. 30 tablet 11   [START ON 12/29/2023] aspirin  EC 81 MG tablet Take 1 tablet (81 mg total) by mouth daily. Swallow whole. 30 tablet 0   Calcium  Carbonate-Vitamin D (CALCIUM  600+D) 600-400 MG-UNIT tablet Take 2 tablets by mouth daily.     clonazePAM  (KLONOPIN ) 1 MG tablet Take 1 mg by mouth at bedtime.     [START ON 12/29/2023] clopidogrel (PLAVIX) 75 MG tablet Take 1 tablet (75 mg total) by  mouth daily. 30 tablet 0   diphenhydrAMINE-APAP, sleep, (TYLENOL  PM EXTRA STRENGTH PO) Take 2 tablets by mouth at bedtime.      Dulaglutide  (TRULICITY ) 0.75 MG/0.5ML SOPN Inject 0.75 mg into the skin once a week. 10 mL 0   estradiol (ESTRACE) 2 MG tablet Take 2 mg by mouth daily.  11   insulin  glargine (LANTUS) 100 UNIT/ML injection Inject 0.1 mLs (10 Units total) into the skin daily. 10 mL 0   JARDIANCE 10 MG TABS tablet Take 10 mg by mouth daily.     lisinopril  (PRINIVIL ,ZESTRIL ) 10 MG tablet Take 10 mg by mouth daily.     meclizine (ANTIVERT) 12.5 MG tablet Take 12.5 mg by mouth 2 (two) times daily.     metFORMIN (GLUCOPHAGE) 500 MG tablet Take 500-1,000 mg by mouth See admin instructions. Take 1 tablet (500 mg) in the morning & take 2 tablets (1000 mg) by mouth in the evening.     Multiple Vitamin (MULTIVITAMIN WITH MINERALS) TABS tablet Take 1 tablet by mouth daily.     pantoprazole  (PROTONIX ) 40 MG tablet Take 40 mg by mouth daily before breakfast.  11   rosuvastatin  (CRESTOR ) 20 MG  tablet Take 20 mg by mouth.     sertraline (ZOLOFT) 50 MG tablet Take 50 mg by mouth daily.     venlafaxine  XR (EFFEXOR -XR) 37.5 MG 24 hr capsule Take 1 capsule by mouth daily.        Home: Home Living Family/patient expects to be discharged to:: Private residence Living Arrangements: Children Available Help at Discharge: Family, Other (Comment) (other family members in town for support; Comptroller in the home during the week(for Dtr?)) Type of Home: Mobile home Home Access: Ramped entrance Home Layout: One level Bathroom Shower/Tub: Health visitor: Handicapped height Bathroom Accessibility: Yes Home Equipment: Grab bars - tub/shower, Information systems manager, Rollator (4 wheels) Additional Comments: reported 5 falls in the last month. daughter also have a school she goes to mon-fri, via transport  Lives With: Daughter   Functional History: Prior Function Prior Level of Function :  Independent/Modified Independent, Driving, History of Falls (last six months) ADLs Comments: pt reports she is caregiver for adult daughter with CP at home and daughter requires assist for everything (aide comes 1x/ wk to assist with bathing and otherwise there several days a week for other assist for daughter). Multiple recent falls.   Functional Status:  Mobility: Bed Mobility Overal bed mobility: Needs Assistance Bed Mobility: Supine to Sit Supine to sit: Min assist, HOB elevated Sit to supine: Contact guard assist, Used rails General bed mobility comments: step by step multi modal cues for technique Transfers Overall transfer level: Needs assistance Equipment used: None Transfers: Sit to/from Stand Sit to Stand: Min assist (via 1 HHA) General transfer comment: Static standing at RW for several minutes to assist with hygiene after incontinent of urine Ambulation/Gait Ambulation/Gait assistance: Min assist Gait Distance (Feet): 10 Feet Assistive device: Rolling walker (2 wheels) Gait Pattern/deviations: Step-to pattern, Decreased stride length, Decreased dorsiflexion - right, Decreased step length - right, Drifts right/left General Gait Details: High fall risk, unsteady with changes in direction and navigating around objects Gait velocity: decreased   ADL: ADL Overall ADL's : Needs assistance/impaired Eating/Feeding: Supervision/ safety, Sitting Eating/Feeding Details (indicate cue type and reason): frequent mutli modal cues for incorporation of RUE Grooming: Wash/dry face, Sitting, Supervision/safety Grooming Details (indicate cue type and reason): Cues for attention to task Upper Body Bathing: Moderate assistance, Sitting Upper Body Bathing Details (indicate cue type and reason): frequent multi modal cues for incorporation of RUE, pt does head turn and looks at R arm pit with cueing from therapist while washing with LUE; Lower Body Bathing: Moderate assistance, Sit to/from  stand Upper Body Dressing : Minimal assistance, Sitting Upper Body Dressing Details (indicate cue type and reason): frequent multi modal cues for hemi dressing technique Lower Body Dressing: Maximal assistance Toilet Transfer: Minimal assistance, Moderate assistance, Ambulation, Cueing for sequencing Toilet Transfer Details (indicate cue type and reason): simulated with 2 person HHA Toileting- Clothing Manipulation and Hygiene: Maximal assistance, Sit to/from stand Toileting - Clothing Manipulation Details (indicate cue type and reason): for thoroughness Functional mobility during ADLs: Minimal assistance, Moderate assistance, Cueing for sequencing, Cueing for safety, +2 for physical assistance, +2 for safety/equipment (two person HHA approx 75') General ADL Comments: Toilet t/f MODA , DME management due to R sided deficits, CGA pericare   Cognition: Cognition Overall Cognitive Status: No family/caregiver present to determine baseline cognitive functioning Arousal/Alertness: Awake/alert Orientation Level: Oriented X4 Year: 2025 Month: May Attention: Focused Focused Attention: Impaired (min distracted easily) Memory: Impaired Memory Impairment: Decreased recall of new information (5 words task) Awareness: Impaired (re:  her R sided weakness) Awareness Impairment: Anticipatory impairment Problem Solving:  (min - wasn't aware she was leaning in the chair and needed to correct positioning) Executive Function: Reasoning, Decision Making Reasoning: Impaired Reasoning Impairment: Verbal complex, Functional complex Decision Making: Impaired Decision Making Impairment: Verbal complex, Functional complex Safety/Judgment: Impaired Cognition Arousal: Alert Behavior During Therapy: WFL for tasks assessed/performed Overall Cognitive Status: No family/caregiver present to determine baseline cognitive functioning    Physical Exam: Blood pressure (!) 139/94, pulse 88, temperature 99.4 F (37.4  C), temperature source Oral, resp. rate 20, height 4\' 9"  (1.448 m), weight 70.7 kg, SpO2 99%.   General: No apparent distress HEENT: Head is normocephalic, atraumatic, sclera anicteric, oral mucosa pink and moist, dentition decreased Neck: Supple without JVD or lymphadenopathy Heart: Reg rate and rhythm. No murmurs rubs or gallops Chest: CTA bilaterally without wheezes, rales, or rhonchi; no distress Abdomen: Soft, non-tender, non-distended, bowel sounds positive. Extremities: No clubbing, cyanosis, or edema. Pulses are 2+ Psych: Pt's affect is appropriate. Pt is cooperative Skin: Bruises on her arms noted Neuro:    Mental Status: AAOx4, Makes eye contact with examiner. Right sided neglect   Provides name and age but does display some decrease in attention and delay in processing.  She was a fair medical historian.  Speech/Languate: Naming and repetition intact, fluent, follows simple commands. Mild dysarthria, no aphasia noted CRANIAL NERVES: II: PERRL.  Right homonymous hemianopsia III, IV, VI: EOM intact, right neglect V: normal sensation bilaterally VII: Right facial weakness VIII: normal hearing to speech IX, X: normal palatal elevation XI: Shoulder shrug intact XII: Tongue midline   MOTOR: RUE: 4-/5 Deltoid, 4/5 Biceps, 4/5 Triceps,4/5 Grip LUE: 5/5 Deltoid, 5/5 Biceps, 5/5 Triceps, 5/5 Grip RLE: HF 4/5, KE 4+/5, ADF 4+/5, APF 4+/5 LLE: HF 5/5, KE 5/5, ADF 5/5, APF 5/5   SENSORY: Normal to touch all 4 extremities  Coordination: Finger-nose altered on the right  MSK: No joint swelling noted Increased tone particularly right knee extensors     Results for orders placed or performed during the hospital encounter of 12/20/23 (from the past 48 hours)  Glucose, capillary     Status: Abnormal   Collection Time: 12/26/23  5:00 PM  Result Value Ref Range   Glucose-Capillary 196 (H) 70 - 99 mg/dL    Comment: Glucose reference range applies only to samples taken after  fasting for at least 8 hours.  Glucose, capillary     Status: Abnormal   Collection Time: 12/26/23  9:45 PM  Result Value Ref Range   Glucose-Capillary 196 (H) 70 - 99 mg/dL    Comment: Glucose reference range applies only to samples taken after fasting for at least 8 hours.  Glucose, capillary     Status: Abnormal   Collection Time: 12/27/23  7:31 AM  Result Value Ref Range   Glucose-Capillary 154 (H) 70 - 99 mg/dL    Comment: Glucose reference range applies only to samples taken after fasting for at least 8 hours.  Glucose, capillary     Status: Abnormal   Collection Time: 12/27/23 12:01 PM  Result Value Ref Range   Glucose-Capillary 231 (H) 70 - 99 mg/dL    Comment: Glucose reference range applies only to samples taken after fasting for at least 8 hours.  Glucose, capillary     Status: None   Collection Time: 12/27/23  4:47 PM  Result Value Ref Range   Glucose-Capillary 82 70 - 99 mg/dL    Comment: Glucose reference range applies  only to samples taken after fasting for at least 8 hours.  Glucose, capillary     Status: Abnormal   Collection Time: 12/27/23  8:13 PM  Result Value Ref Range   Glucose-Capillary 260 (H) 70 - 99 mg/dL    Comment: Glucose reference range applies only to samples taken after fasting for at least 8 hours.  Glucose, capillary     Status: Abnormal   Collection Time: 12/28/23  7:23 AM  Result Value Ref Range   Glucose-Capillary 173 (H) 70 - 99 mg/dL    Comment: Glucose reference range applies only to samples taken after fasting for at least 8 hours.  Glucose, capillary     Status: Abnormal   Collection Time: 12/28/23  9:01 AM  Result Value Ref Range   Glucose-Capillary 212 (H) 70 - 99 mg/dL    Comment: Glucose reference range applies only to samples taken after fasting for at least 8 hours.  Glucose, capillary     Status: Abnormal   Collection Time: 12/28/23 12:18 PM  Result Value Ref Range   Glucose-Capillary 112 (H) 70 - 99 mg/dL    Comment: Glucose  reference range applies only to samples taken after fasting for at least 8 hours.   No results found.    Blood pressure (!) 139/94, pulse 88, temperature 99.4 F (37.4 C), temperature source Oral, resp. rate 20, height 4\' 9"  (1.448 m), weight 70.7 kg, SpO2 99%.  Medical Problem List and Plan: 1. 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  Antithrombotics: -DVT/anticoagulation:  Pharmaceutical: Lovenox   -antiplatelet therapy: Aspirin  81 mg daily and Plavix 75 mg daily x 3 months then Plavix alone 3. Pain Management: Tylenol  as needed 4. Mood/Behavior/Sleep: Effexor  37.5 mg daily, Klonopin  1 mg nightly  -antipsychotic agents: N/A  -Pt reports daughter passed away around the time she was admitted, consider neuropsych consult  -She is not interested in any adjustment to antidepressant medications at this time 5. Neuropsych/cognition: This patient is capable of making decisions on her own behalf. 6. Skin/Wound Care: Routine skin checks 7. Fluids/Electrolytes/Nutrition: Routine in and outs with follow-up chemistries 8.  Diabetes mellitus.  Hemoglobin A1c 8.4.  Presently on NovoLog  5 units 3 times daily, Semglee 10 units daily.  Check blood sugars AC and at bedtime.  Diabetic teaching.  Prior to admission patient on Trulicity  0.75 mg weekly, Amaryl  2 mg daily, Glucophage 500 mg in the morning and 1000 mg in the evening, Glucotrol XL 10 mg daily.  Monitor with increased activity 9.  AKI on CKD stage III.  Follow-up chemistries 10.  Permissive hypertension.  Monitor with increased mobility.  Resume home Norvasc  5 mg daily, lisinopril  10 mg daily as needed 11.  Hyperlipidemia.  Crestor  12.  GERD.  Protonix  13.  B12 deficiency  - Outpatient B12 injection.  Try to determine when she had this last 14. Chronic constipation  -Reports BM yesterday  -Miralax  and senokot    Sterling Eisenmenger, PA-C 12/28/2023 I have personally performed a face to face diagnostic evaluation of this patient and formulated the key components of the plan.  Additionally, I have personally reviewed laboratory data, imaging studies, as well as relevant notes and concur with the physician assistant's documentation above.  The patient's status has not changed from the original H&P.  Any changes in documentation from the acute care chart have been noted above.  Haley Sand, MD

## 2023-12-28 NOTE — Plan of Care (Signed)

## 2023-12-28 NOTE — Progress Notes (Addendum)
 PMR Admission Coordinator Pre-Admission Assessment   Patient: Haley Jones is an 67 y.o., female MRN: [id number] DOB: 10-16-1955 Height: 4'9" Weight: 70.3 kg   Insurance Information HMO: yes    PPO:      PCP:      IPA:      80/20:      OTHER:  PRIMARY: [employer]      Policy#: [id number], Medicare: [id number]      Subscriber: patient CM Name:       Phone#:    Fax#:  [phone]  Pre-Cert#: [id number] denial overturned on appeal.  Received call from [place] with approval on 12/27/23    Employer: Retired Benefits:  Phone #: [phone]     Name: Verified Venson Ginger Date: 08/15/2023- still active  Deductible: does not have  OOP Max: $4,000 ($20.18 met)  CIR: $160/day co-pay with a max co-pay of $1,600/admission (10 days)  SNF: $0/day co-pay for days 1-20, $50/day co-pay for days 21-100, limited to 100 days/cal yr   Outpatient:  $20 copay/visit Home Health:  100% coverage  DME: 80% coverage; 20% co-insurance   Providers: in network  SECONDARY:       Policy#:      Phone#:    Artist:       Phone#:    The Data processing manager" for patients in Inpatient Rehabilitation Facilities with attached "Privacy Act Statement-Health Care Records" was provided and verbally reviewed with: Patient   Emergency Contact Information Contact Information       Name Relation Home Work Mobile    [place] Sister [phone]   [phone]    Artemisa Bile     [phone]         Other Contacts   None on File        Current Medical History  Patient Admitting Diagnosis: L MCA CVA   History of Present Illness: A 68 year old right-handed female with history significant for hypertension, B12 deficiency, type 2 diabetes mellitus, hyperlipidemia, CKD stage III, depression.  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  She has several family members in the area who can provide assistance.  1 level home with  ramped entrance.  [facility]ed [facility] ED at [facility] with  right side weakness/falls and dizziness.  By report she had had dizziness for the last 2 to 3 weeks she did see her PCP and was started on meclizine.  MRI of the brain showed patchy acute cortical/subcortical infarcts within the left parietal occipital lobes individually measuring up to 3 cm (MCA vascular territory).  Subtle petechial hemorrhage associated with some of these infarcts.  No frank hemorrhagic conversion.  Additional punctate left MCA territory cortical acute infarct within the left insula.  Background parenchymal atrophy and chronic small vessel ischemic disease with chronic lacunar infarcts.  CT angio showed severe stenosis/nonocclusive thrombus of the left MCA bifurcation affecting the inferior division.  Nonocclusive embolus also visible within the left parietal branch as well.  Admission chemistries unremarkable except glucose 262 creatinine 1.61, and latest hemoglobin A1c 8.4.  Echocardiogram ejection fraction of 60 to 65% no wall motion abnormalities grade 1 diastolic dysfunction.  Neurology follow-up placed on aspirin  81 mg daily and Plavix  75 mg daily x 90 days then Plavix  alone.  Lovenox  added for DVT prophylaxis.  Patient initially with permissive hypertension with Norvasc  and lisinopril  slowly to be resumed.  Tolerating a regular consistency diet.  Therapy evaluations completed due to patient's right side  weakness and decreased functional mobility.  Patient to be admitted for a comprehensive inpatient rehab program    Complete NIHSS TOTAL: 2   Patient's medical record from [facility] has been reviewed by the rehabilitation admission coordinator and physician.   Past Medical History      Past Medical History:  Diagnosis Date   Adnexal mass     Anxiety     Chronic back pain     Depression     Diabetes mellitus without complication (HCC)     Endometriosis 03/12/2018   GERD (gastroesophageal reflux  disease)     Heart murmur 02/2018    undetected until she was an adult. 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 No   Family History   family history includes Lung cancer in her father; Melanoma in her mother; Ovarian cancer in her maternal grandmother.   Current Medications  Current Medications    Current Facility-Administered Medications:    acetaminophen  (TYLENOL ) tablet 650 mg, 650 mg, Oral, Q4H PRN, 650 mg at 12/27/23 1959 **OR** acetaminophen  (TYLENOL ) 160 MG/5ML solution 650 mg, 650 mg, Per Tube, Q4H PRN **OR** acetaminophen  (TYLENOL ) suppository 650 mg, 650 mg, Rectal, Q4H PRN, Avi Body, MD   acetaminophen  (TYLENOL ) tablet 500 mg, 500 mg, Oral, QHS PRN, 500 mg at 12/25/23 2122 **AND** diphenhydrAMINE  (BENADRYL ) capsule 25 mg, 25 mg, Oral, QHS PRN, Ayiku, Bernard, MD, 25 mg at 12/23/23 2157   aspirin  EC tablet 81 mg, 81 mg, Oral, Daily, Avi Body, MD, 81 mg at 12/28/23 1610   clonazePAM  (KLONOPIN ) tablet 1 mg, 1 mg, Oral, QHS, Avi Body, MD, 1 mg at 12/27/23 2156   clopidogrel  (PLAVIX ) tablet 75 mg, 75 mg, Oral, Daily, Sheril Dines, MD, 75 mg at 12/28/23 0915   enoxaparin  (LOVENOX ) injection 40 mg, 40 mg, Subcutaneous, Q24H, Basaraba, Iulia, MD, 40 mg at 12/27/23 2156   feeding supplement (ENSURE ENLIVE / ENSURE PLUS) liquid 237 mL, 237 mL, Oral, BID BM, Avi Body, MD, 237 mL at 12/27/23 1713   insulin  aspart (novoLOG ) injection 0-15 Units, 0-15 Units, Subcutaneous, TID WC, Ayiku, Bernard, MD, 5 Units at 12/28/23 0915   insulin  aspart (novoLOG ) injection 0-5 Units, 0-5 Units, Subcutaneous, QHS, Ayiku, Bernard, MD, 3 Units at 12/27/23 2201   insulin  aspart (novoLOG ) injection 5 Units, 5 Units, Subcutaneous, TID WC, Sheril Dines, MD, 5 Units at 12/28/23 9604    insulin  glargine-yfgn (SEMGLEE ) injection 10 Units, 10 Units, Subcutaneous, Daily, Ayiku, Bernard, MD, 10 Units at 12/28/23 0915   pantoprazole  (PROTONIX ) EC tablet 40 mg, 40 mg, Oral, QAC breakfast, Avi Body, MD, 40 mg at 12/28/23 5409   pneumococcal 20-valent conjugate vaccine (PREVNAR 20) injection 0.5 mL, 0.5 mL, Intramuscular, Tomorrow-1000, Avi Body, MD   rosuvastatin  (CRESTOR ) tablet 20 mg, 20 mg, Oral, Daily, Avi Body, MD, 20 mg at 12/28/23 0914   senna-docusate (Senokot-S) tablet 1 tablet, 1 tablet, Oral, QHS PRN, Avi Body, MD   venlafaxine  XR (EFFEXOR -XR) 24 hr capsule 37.5 mg, 37.5 mg, Oral, Daily, Basaraba, Iulia, MD, 37.5 mg at 12/28/23 0915     Patients Current Diet:  Diet Order                  Diet - low sodium heart  healthy             Diet Carb Modified Fluid consistency: Thin  Diet effective now                         Precautions / Restrictions Precautions Precautions: Fall Precaution/Restrictions Comments: Decreased safety awareness Restrictions Weight Bearing Restrictions Per Provider Order: No    Has the patient had 2 or more falls or a fall with injury in the past year? Yes   Prior Activity Level Community (5-7x/wk): pt active in the community PTA   Prior Functional Level Self Care: Did the patient need help bathing, dressing, using the toilet or eating? Independent   Indoor Mobility: Did the patient need assistance with walking from room to room (with or without device)? Independent   Stairs: Did the patient need assistance with internal or external stairs (with or without device)? Independent   Functional Cognition: Did the patient need help planning regular tasks such as shopping or remembering to take medications? Independent   Patient Information Are you of Hispanic, Latino/a,or Spanish origin?: A. No, not of Hispanic, Latino/a, or Spanish origin What is your race?: A. White Do you need or want an interpreter to  communicate with a doctor or health care staff?: 0. No   Patient's Response To:  Health Literacy and Transportation Is the patient able to respond to health literacy and transportation needs?: Yes Health Literacy - How often do you need to have someone help you when you read instructions, pamphlets, or other written material from your doctor or pharmacy?: Never In the past 12 months, has lack of transportation kept you from medical appointments or from getting medications?: No In the past 12 months, has lack of transportation kept you from meetings, work, or from getting things needed for daily living?: No   Journalist, newspaper / Equipment Home Equipment: Grab bars - tub/shower, Information systems manager, Rollator (4 wheels)   Prior Device Use: Indicate devices/aids used by the patient prior to current illness, exacerbation or injury? Walker   Current Functional Level Cognition   Arousal/Alertness: Awake/alert Overall Cognitive Status: No family/caregiver present to determine baseline cognitive functioning Orientation Level: Oriented X4 Attention: Focused Focused Attention: Impaired (min distracted easily) Memory: Impaired Memory Impairment: Decreased recall of new information (5 words task) Awareness: Impaired (re: her R sided weakness) Awareness Impairment: Anticipatory impairment Problem Solving:  (min - wasn't aware she was leaning in the chair and needed to correct positioning) Executive Function: Reasoning, Decision Making Reasoning: Impaired Reasoning Impairment: Verbal complex, Functional complex Decision Making: Impaired Decision Making Impairment: Verbal complex, Functional complex Safety/Judgment: Impaired    Extremity Assessment (includes Sensation/Coordination)   Upper Extremity Assessment: Left hand dominant, RUE deficits/detail RUE Deficits / Details: noted improvements in RUE fuction- shoulder flexion AROM approx 90 degrees, elbow approx 3/4 full AROM, wrist 3/4 full AROM;  MAS 0 in shoulder, 1 in elbow flexion/extension RUE: Shoulder pain with ROM RUE Coordination: decreased fine motor  Lower Extremity Assessment: Defer to PT evaluation RLE Deficits / Details: pt unable to extinguish light touch on RLE, inattention noted throughout. when MMT position achieved, strength in knee 5/5, decreased hip flexion strength 4-/5, unable to MMT DF/PF truly due to difficulty following commands, but noted for some active DF/PF LLE Deficits / Details: WFLs     ADLs   Overall ADL's : Needs assistance/impaired Eating/Feeding: Supervision/ safety, Sitting Eating/Feeding Details (indicate cue type and reason): frequent mutli modal cues for incorporation of  RUE Grooming: Wash/dry face, Sitting, Supervision/safety Grooming Details (indicate cue type and reason): Cues for attention to task Upper Body Bathing: Moderate assistance, Sitting Upper Body Bathing Details (indicate cue type and reason): frequent multi modal cues for incorporation of RUE, pt does head turn and looks at R arm pit with cueing from therapist while washing with LUE; Lower Body Bathing: Moderate assistance, Sit to/from stand Upper Body Dressing : Minimal assistance, Sitting Upper Body Dressing Details (indicate cue type and reason): frequent multi modal cues for hemi dressing technique Lower Body Dressing: Maximal assistance Toilet Transfer: Minimal assistance, Moderate assistance, Ambulation, Cueing for sequencing Toilet Transfer Details (indicate cue type and reason): simulated with 2 person HHA Toileting- Clothing Manipulation and Hygiene: Maximal assistance, Sit to/from stand Toileting - Clothing Manipulation Details (indicate cue type and reason): for thoroughness Functional mobility during ADLs: Minimal assistance, Moderate assistance, Cueing for sequencing, Cueing for safety, +2 for physical assistance, +2 for safety/equipment (two person HHA approx 75') General ADL Comments: Toilet t/f MODA , DME  management due to R sided deficits, CGA pericare     Mobility   Overal bed mobility: Needs Assistance Bed Mobility: Supine to Sit Supine to sit: Min assist, HOB elevated Sit to supine: Contact guard assist, Used rails General bed mobility comments: step by step multi modal cues for technique     Transfers   Overall transfer level: Needs assistance Equipment used: None Transfers: Sit to/from Stand Sit to Stand: Min assist (via 1 HHA) General transfer comment: Static standing at RW for several minutes to assist with hygiene after incontinent of urine     Ambulation / Gait / Stairs / Wheelchair Mobility   Ambulation/Gait Ambulation/Gait assistance: Editor, commissioning (Feet): 10 Feet Assistive device: Rolling walker (2 wheels) Gait Pattern/deviations: Step-to pattern, Decreased stride length, Decreased dorsiflexion - right, Decreased step length - right, Drifts right/left General Gait Details: High fall risk, unsteady with changes in direction and navigating around objects Gait velocity: decreased     Posture / Balance Dynamic Sitting Balance Sitting balance - Comments: two posterior LOB in sitting Balance Overall balance assessment: Needs assistance Sitting-balance support: Feet supported Sitting balance-Leahy Scale: Fair Sitting balance - Comments: two posterior LOB in sitting Postural control: Posterior lean Standing balance support: Bilateral upper extremity supported, During functional activity, Reliant on assistive device for balance Standing balance-Leahy Scale: Fair Standing balance comment: High fall risk     Special needs/care consideration H/o DM - insulin  coverage in acute hospital    Previous Home Environment (from acute therapy documentation) Living Arrangements: Children  Lives With: Daughter Available Help at Discharge: Family, Other (Comment) (other family members in town for support; Comptroller in the home during the week(for Dtr?)) Type of Home: Mobile  home Home Layout: One level Home Access: Ramped entrance Bathroom Shower/Tub: Health visitor: Handicapped height Bathroom Accessibility: Yes How Accessible: Accessible via walker Home Care Services: No Additional Comments: reported 5 falls in the last month. daughter also have a school she goes to mon-fri, via transport   Discharge Living Setting Plans for Discharge Living Setting: House Type of Home at Discharge: House Discharge Home Layout: One level Discharge Home Access: Ramped entrance Discharge Bathroom Shower/Tub: Walk-in shower Discharge Bathroom Toilet: Handicapped height Discharge Bathroom Accessibility: Yes How Accessible: Accessible via walker Does the patient have any problems obtaining your medications?: No   Social/Family/Support Systems Patient Roles: Other (Comment) Contact Information: Sister karen green Anticipated Caregiver: [phone] Ability/Limitations of Caregiver: min A Caregiver Availability: 24/7 Discharge  Plan Discussed with Primary Caregiver: Yes Is Caregiver In Agreement with Plan?: Yes Does Caregiver/Family have Issues with Lodging/Transportation while Pt is in Rehab?: No   Goals Patient/Family Goal for Rehab: PT/OT/SLP supervision Expected length of stay: 10-12 days Pt/Family Agrees to Admission and willing to participate: Yes Program Orientation Provided & Reviewed with Pt/Caregiver Including Roles  & Responsibilities: Yes   Decrease burden of Care through IP rehab admission: not anticipated   Possible need for SNF placement upon discharge: not anticipated   Patient Condition: I have reviewed medical records from [facility] , spoken with CM, and patient and family member. I met with patient at the bedside for inpatient rehabilitation assessment.  Patient will benefit from ongoing PT, OT, and SLP, can actively participate in 3 hours of therapy a day 5 days of the week, and can make measurable gains during  the admission.  Patient will also benefit from the coordinated team approach during an Inpatient Acute Rehabilitation admission.  The patient will receive intensive therapy as well as Rehabilitation physician, nursing, social worker, and care management interventions.  Due to safety, skin/wound care, disease management, medication administration, pain management, and patient education the patient requires 24 hour a day rehabilitation nursing.  The patient is currently min assist with mobility and basic ADLs.  Discharge setting and therapy post discharge at home with home health is anticipated.  Patient has agreed to participate in the Acute Inpatient Rehabilitation Program and will admit today.   Preadmission Screen Completed By:  Chilton Couch, 12/28/2023 11:47 AM ______________________________________________________________________   Discussed status with Dr. Rachel Budds on 12/28/23 at 0930 and received approval for admission today.   Admission Coordinator:  Chilton Couch, RN, time 1152/Date 12/28/23    Assessment/Plan: Diagnosis: CVA left MCA Does the need for close, 24 hr/day Medical supervision in concert with the patient's rehab needs make it unreasonable for this patient to be served in a less intensive setting? Yes Co-Morbidities requiring supervision/potential complications: HTN, hypokalemia, DM2, HLD, Vit B 12 def, anxiety, depression Due to bladder management, bowel management, safety, skin/wound care, disease management, medication administration, pain management, and patient education, does the patient require 24 hr/day rehab nursing? Yes Does the patient require coordinated care of a physician, rehab nurse, PT, OT, and SLP to address physical and functional deficits in the context of the above medical diagnosis(es)? Yes Addressing deficits in the following areas: balance, endurance, locomotion, strength, transferring, bowel/bladder control, bathing, dressing, feeding, grooming, toileting,  cognition, speech, language, and psychosocial support Can the patient actively participate in an intensive therapy program of at least 3 hrs of therapy 5 days a week? Yes The potential for patient to make measurable gains while on inpatient rehab is excellent Anticipated functional outcomes upon discharge from inpatient rehab: supervision PT, supervision OT, supervision SLP Estimated rehab length of stay to reach the above functional goals is: 10-12 Anticipated discharge destination: Home 10. Overall Rehab/Functional Prognosis: excellent     MD Signature: Lylia Sand          Revision History  Routing History

## 2023-12-29 DIAGNOSIS — K5900 Constipation, unspecified: Secondary | ICD-10-CM | POA: Diagnosis not present

## 2023-12-29 DIAGNOSIS — I63512 Cerebral infarction due to unspecified occlusion or stenosis of left middle cerebral artery: Secondary | ICD-10-CM | POA: Diagnosis not present

## 2023-12-29 DIAGNOSIS — R519 Headache, unspecified: Secondary | ICD-10-CM | POA: Diagnosis not present

## 2023-12-29 DIAGNOSIS — N179 Acute kidney failure, unspecified: Secondary | ICD-10-CM | POA: Diagnosis not present

## 2023-12-29 LAB — GLUCOSE, CAPILLARY
Glucose-Capillary: 140 mg/dL — ABNORMAL HIGH (ref 70–99)
Glucose-Capillary: 133 mg/dL — ABNORMAL HIGH (ref 70–99)
Glucose-Capillary: 191 mg/dL — ABNORMAL HIGH (ref 70–99)
Glucose-Capillary: 121 mg/dL — ABNORMAL HIGH (ref 70–99)

## 2023-12-29 NOTE — Evaluation (Signed)
 Speech Language Pathology Assessment and Plan  Patient Details  Name: Haley Jones MRN: [id number] Date of Birth: [birth year]  SLP Diagnosis: Cognitive Impairments  Rehab Potential: Good ELOS: 10-12 days    Today's Date: 12/29/2023 SLP Individual Time: 0803-0902 SLP Individual Time Calculation (min): 59 min   Hospital Problem: Principal Problem:   Left middle cerebral artery stroke [facility])  Past Medical History:  Past Medical History:  Diagnosis Date   Adnexal mass    Anxiety    Chronic back pain    Depression    Diabetes mellitus without complication (HCC)    Endometriosis 03/12/2018   GERD (gastroesophageal reflux disease)    Heart murmur 02/2018   undetected until she was an adult. 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   Weight loss 12/06/2017   Past Surgical History:  Past Surgical History:  Procedure Laterality Date   ABDOMINAL HYSTERECTOMY  2008   ovaries also   CESAREAN SECTION  1994   COLONOSCOPY     COLONOSCOPY WITH PROPOFOL  N/A 02/12/2018   Procedure: COLONOSCOPY WITH PROPOFOL ;  Surgeon: Toledo, Alphonsus Jeans, MD;  Location: [facility] ENDOSCOPY;  Service: Gastroenterology;  Laterality: N/A;   ESOPHAGOGASTRODUODENOSCOPY (EGD) WITH PROPOFOL  N/A 02/12/2018   Procedure: ESOPHAGOGASTRODUODENOSCOPY (EGD) WITH PROPOFOL ;  Surgeon: Toledo, Alphonsus Jeans, MD;  Location: [facility] ENDOSCOPY;  Service: Gastroenterology;  Laterality: N/A;   EYE SURGERY Left 1973   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 Haley Jones is a 68 year old left-handed female with history significant for hypertension, B12  deficiency, type 2 diabetes mellitus, hyperlipidemia, CKD stage III, depression.  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  She reports her daughter passed away on the day he was hospitalized.  She has several family members in the area who can provide assistance.  1 level home with ramped entrance.  Presented to [facility] 12/20/2023 with  right side weakness/falls and dizziness.  By report she had had dizziness for the last 2 to 3 weeks she did see her PCP and was started on meclizine.  MRI of the brain showed patchy acute cortical/subcortical infarcts within the left parietal occipital lobes individually measuring up to 3 cm (MCA vascular territory).  Subtle petechial hemorrhage associated with some of these infarcts.  No frank hemorrhagic conversion.  Additional punctate left MCA territory cortical acute infarct within the left insula.  Background parenchymal atrophy and chronic small vessel ischemic disease with chronic lacunar infarcts.  CT angio showed severe stenosis/nonocclusive thrombus of the left MCA bifurcation affecting the inferior division.  Nonocclusive embolus also visible within the left parietal branch as well.   Echocardiogram ejection fraction of 60 to 65% no wall motion abnormalities grade 1 diastolic dysfunction.   Tolerating a regular consistency diet.  Therapy evaluations completed due to patient's right side weakness and decreased functional mobility was admitted for a comprehensive rehab program. Pt was admitted to CIR on 12/28/23.  Cognitive/ Linguistic: Pt presents with a moderate cogntiive linguistic deficit. COGNISTAT administered and revealed mild impairments in orientation, moderate impairments in calculations and executive function, and severe impairments  in contructional ability and memory. Informal assessment revealed deficits in attention and mental flexibility through conversational exchanges. She had mild dificulty navigating her phone  requiring more than a reasonable amount of time to locate pictures. She was able to successfully utilize therapy schedule given supervision to locate therapist names, time, and discipline. She warranted min A to utilize menu to identify meal options for this date. Pt with limited recall of events leading up to stroke. She did recall that PTA she was indep with medication and financial management and was the primary caregiver for her dtr with CP. When asked pt denied changes in cognition following stroke, though no family at bedside to corroborate cognitive baseline.  SLP recommending skilled intervention to address memory, attention, awareness, problem solving, and orientation.   Pt's voice is hoarse but she reports that is her baseline vocal quality. No skilled intervention warranted for voice therapy at this time.   Pt would benefit from skilled SLP services to maximize cognition in order to maximize her independence prior to discharge. Anticipate pt will require supervision at home and f/u home health/ outpatient SLP services.     Skilled Therapeutic Interventions          Informal assessment measures and COGNISTAT administered. Please see full report for additional details.      SLP Assessment  Patient will need skilled Speech Lanaguage Pathology Services during CIR admission    Recommendations  SLP Diet Recommendations: Age appropriate regular solids;Thin Oral Care Recommendations: Oral care BID Recommendations for Other Services: Neuropsych consult Patient destination: Home Follow up Recommendations: 24 hour supervision/assistance;Home Health SLP;Outpatient SLP Equipment Recommended: None recommended by SLP    SLP Frequency 3 to 5 out of 7 days   SLP Duration  SLP Intensity  SLP Treatment/Interventions 10-12 days  Minumum of 1-2 x/day, 30 to 90 minutes  Cognitive remediation/compensation;Environmental controls;Functional tasks;Internal/external aids;Patient/family education     Pain Pain Assessment Pain Scale: 0-10 Pain Score: 0-No pain  Prior Functioning Cognitive/Linguistic Baseline: Information not available Type of Home: Mobile home  Lives With: Alone (lived with dtr who has disabled. Dtr recently passed. Pt was primary caregiver) Available Help at Discharge: Family Vocation: Retired  SLP Evaluation Cognition Overall Cognitive Status: Impaired/Different from baseline Arousal/Alertness: Awake/alert Orientation Level: Oriented X4 Year: 2025 Month: May Day of Week: Correct Attention: Focused;Sustained Focused Attention: Appears intact Sustained Attention: Impaired Sustained Attention Impairment: Verbal basic;Functional basic Memory: Impaired Memory Impairment: Decreased recall of new information Awareness: Impaired Awareness Impairment: Intellectual impairment;Emergent impairment Executive Function: Reasoning;Decision Making Reasoning: Impaired Reasoning Impairment: Verbal complex;Functional complex Decision Making: Impaired Decision Making Impairment: Verbal complex;Functional complex Safety/Judgment: Impaired  Comprehension Auditory Comprehension Overall Auditory Comprehension: Appears within functional limits for tasks assessed Other Conversation Comments: distracted easily Interfering Components: Attention Expression Expression Primary Mode of Expression: Verbal Verbal Expression Overall Verbal Expression: Appears within functional limits for tasks assessed Written Expression Dominant Hand: Left Written Expression: Not tested Oral Motor Oral Motor/Sensory Function Overall Oral Motor/Sensory Function: Within functional limits Motor Speech Overall Motor Speech: Appears within functional limits for tasks assessed  Care Tool Care Tool Cognition Ability to hear (with hearing aid or hearing appliances if normally used Ability to hear (with hearing aid or hearing appliances if normally used): 0. Adequate - no difficulty in normal  conservation, social interaction, listening to TV   Expression of Ideas and Wants Expression of Ideas and Wants: 4. Without difficulty (complex and basic) - expresses complex messages without difficulty and with speech that is clear and easy  to understand   Understanding Verbal and Non-Verbal Content Understanding Verbal and Non-Verbal Content: 3. Usually understands - understands most conversations, but misses some part/intent of message. Requires cues at times to understand  Memory/Recall Ability Memory/Recall Ability : Staff names and faces;That he or she is in a hospital/hospital unit    Short Term Goals: Week 1: SLP Short Term Goal 1 (Week 1): Patient will demonstrate problem solving in mildly complex situations given min multimodal A SLP Short Term Goal 2 (Week 1): Patient will sustain attention to task for 20 minutes given min assist with limited tangential interruptions. SLP Short Term Goal 3 (Week 1): Patient will utilize external and internal memory strategies to recall new daily information with 80% accuracy given min assist. SLP Short Term Goal 4 (Week 1): Patient will demonstrate intellectual awareness of deficits by stating 2 cognitive deficits given mod verbal cue  Refer to Care Plan for Long Term Goals  Recommendations for other services: None   Discharge Criteria: Patient will be discharged from SLP if patient refuses treatment 3 consecutive times without medical reason, if treatment goals not met, if there is a change in medical status, if patient makes no progress towards goals or if patient is discharged from hospital.  The above assessment, treatment plan, treatment alternatives and goals were discussed and mutually agreed upon: by patient  Adela Holter 12/29/2023, 3:08 PM

## 2023-12-29 NOTE — Discharge Summary (Signed)
 Physician Discharge Summary  Patient ID: Haley Jones MRN: [id number] DOB/AGE: 1955/10/25 68 y.o.  Admit date: 12/28/2023 Discharge date: 01/18/2024  Discharge Diagnoses:  Principal Problem:   Left middle cerebral artery stroke [facility]) Active Problems:   Depression with anxiety DVT prophylaxis Diabetes mellitus AKI on CKD stage III Permissive hypertension Hyperlipidemia GERD B12 deficiency Chronic constipation Mood stabilization  Discharged Condition: Stable  Significant Diagnostic Studies: ECHOCARDIOGRAM COMPLETE Result Date: 12/20/2023    ECHOCARDIOGRAM REPORT   Patient Name:   Haley Jones Date of Exam: 12/20/2023 Medical Rec #:  [id number]             Height:       57.0 in Accession #:    [id number]            Weight:       155.0 lb Date of Birth:  [birth year]             BSA:          1.614 m Patient Age:    68 years              BP:           119/98 mmHg Patient Gender: F                     HR:           82 bpm. Exam Location:  [facility] Procedure: 2D Echo (Both Spectral and Color Flow Doppler were utilized during            procedure). Indications:     stroke  History:         Patient has no prior history of Echocardiogram examinations.                  Risk Factors:Diabetes and Hypertension.  Sonographer:     Dione Franks RDCS Referring Phys:  [id number] Avi Body Diagnosing Phys: Belva Boyden MD IMPRESSIONS  1. Left ventricular ejection fraction, by estimation, is 60 to 65%. The left ventricle has normal function. The left ventricle has no regional wall motion abnormalities. Left ventricular diastolic parameters are consistent with Grade I diastolic dysfunction (impaired relaxation).  2. Right ventricular systolic function is normal. The right ventricular size is normal.  3. The mitral valve is normal in structure. No evidence of mitral valve regurgitation. No evidence of mitral stenosis.  4. The aortic valve is normal in structure. Aortic valve regurgitation is not  visualized. Aortic valve sclerosis is present, with no evidence of aortic valve stenosis.  5. The inferior vena cava is normal in size with greater than 50% respiratory variability, suggesting right atrial pressure of 3 mmHg. FINDINGS  Left Ventricle: Left ventricular ejection fraction, by estimation, is 60 to 65%. The left ventricle has normal function. The left ventricle has no regional wall motion abnormalities. Strain was performed and the global longitudinal strain is indeterminate. The left ventricular internal cavity size was normal in size. There is no left ventricular hypertrophy. Left ventricular diastolic parameters are consistent with Grade I diastolic dysfunction (impaired relaxation). Right Ventricle: The right ventricular size is normal. No increase in right ventricular wall thickness. Right ventricular systolic function is normal. Left Atrium: Left atrial size was normal in size. Right Atrium: Right atrial size was normal in size. Pericardium: There is no evidence of pericardial effusion. Mitral Valve: The mitral valve is normal in structure. No evidence of mitral valve regurgitation. No evidence of mitral valve stenosis. Tricuspid Valve:  The tricuspid valve is normal in structure. Tricuspid valve regurgitation is not demonstrated. No evidence of tricuspid stenosis. Aortic Valve: The aortic valve is normal in structure. Aortic valve regurgitation is not visualized. Aortic valve sclerosis is present, with no evidence of aortic valve stenosis. Pulmonic Valve: The pulmonic valve was normal in structure. Pulmonic valve regurgitation is not visualized. No evidence of pulmonic stenosis. Aorta: The aortic root is normal in size and structure. Venous: The inferior vena cava is normal in size with greater than 50% respiratory variability, suggesting right atrial pressure of 3 mmHg. IAS/Shunts: No atrial level shunt detected by color flow Doppler. Additional Comments: 3D was performed not requiring image post  processing on an independent workstation and was indeterminate.  LEFT VENTRICLE PLAX 2D LVIDd:         3.90 cm   Diastology LVIDs:         2.50 cm   LV e' medial:    9.46 cm/s LV PW:         1.10 cm   LV E/e' medial:  10.1 LV IVS:        1.00 cm   LV e' lateral:   10.90 cm/s LVOT diam:     1.70 cm   LV E/e' lateral: 8.8 LV SV:         49 LV SV Index:   30 LVOT Area:     2.27 cm  RIGHT VENTRICLE             IVC RV Basal diam:  2.60 cm     IVC diam: 1.70 cm RV S prime:     21.60 cm/s TAPSE (M-mode): 1.7 cm LEFT ATRIUM           Index        RIGHT ATRIUM          Index LA diam:      3.00 cm 1.86 cm/m   RA Area:     9.65 cm LA Vol (A4C): 31.8 ml 19.70 ml/m  RA Volume:   19.10 ml 11.83 ml/m  AORTIC VALVE LVOT Vmax:   119.00 cm/s LVOT Vmean:  79.900 cm/s LVOT VTI:    0.215 m  AORTA Ao Root diam: 3.00 cm Ao Asc diam:  3.20 cm MITRAL VALVE MV Area (PHT): 3.60 cm    SHUNTS MV Decel Time: 211 msec    Systemic VTI:  0.22 m MV E velocity: 95.60 cm/s  Systemic Diam: 1.70 cm MV A velocity: 97.10 cm/s MV E/A ratio:  0.98 Belva Boyden MD Electronically signed by Belva Boyden MD Signature Date/Time: 12/20/2023/6:28:29 PM    Final    CT ANGIO HEAD NECK W WO CM Result Date: 12/20/2023 CLINICAL DATA:  Neuro deficit, acute, stroke suspected. Worsening confusion. Falling and dizziness. EXAM: CT ANGIOGRAPHY HEAD AND NECK WITH AND WITHOUT CONTRAST TECHNIQUE: Multidetector CT imaging of the head and neck was performed using the standard protocol during bolus administration of intravenous contrast. Multiplanar CT image reconstructions and MIPs were obtained to evaluate the vascular anatomy. Carotid stenosis measurements (when applicable) are obtained utilizing NASCET criteria, using the distal internal carotid diameter as the denominator. RADIATION DOSE REDUCTION: This exam was performed according to the departmental dose-optimization program which includes automated exposure control, adjustment of the mA and/or kV according to  patient size and/or use of iterative reconstruction technique. CONTRAST:  60mL OMNIPAQUE  IOHEXOL  350 MG/ML SOLN COMPARISON:  MRI earlier same day FINDINGS: CT HEAD FINDINGS Brain: No focal abnormality seen affecting the brainstem  or cerebellum. Cerebral hemispheres show moderate chronic small-vessel ischemic changes of the white matter. Acute infarction evident affecting several adjacent gyri in the left parietal region. Mild swelling but no mass effect or hemorrhage. No mass, hydrocephalus or extra-axial collection. Vascular: There is atherosclerotic calcification of the major vessels at the base of the brain. Skull: Negative Sinuses/Orbits: Opacified right maxillary sinus.  Orbits negative. Other: None Review of the MIP images confirms the above findings CTA NECK FINDINGS Aortic arch: Aortic atherosclerosis. Branching pattern is normal without origin stenosis. Right carotid system: Common carotid artery widely patent to the bifurcation. Mild plaque at the bifurcation but no stenosis. Cervical ICA widely patent. Left carotid system: Common carotid artery shows soft plaque in its midportion with stenosis of 25%. Beyond that the vessel is widely patent to the bifurcation. Normal bifurcation. Cervical ICA is normal. Vertebral arteries: No proximal subclavian stenosis. Both vertebral artery origins are widely patent. Vertebral arteries are normal through the cervical region to the foramen magnum. Skeleton: Ordinary cervical spondylosis. Other neck: No mass or lymphadenopathy. Upper chest: Lung apices are clear. Review of the MIP images confirms the above findings CTA HEAD FINDINGS Anterior circulation: Both internal carotid arteries are patent through the skull base and siphon regions. Patent posterior communicating artery on the right. The right anterior and middle cerebral vessels are patent. There is severe stenosis or nonocclusive thrombus at the left MCA bifurcation affecting the inferior division. Nonocclusive  embolus is visible within a left parietal branch as well. This would be M4. Posterior circulation: Both vertebral arteries are patent through the foramen magnum to the basilar artery. No basilar stenosis. Posterior circulation branch vessels are patent. Some atherosclerotic irregularity of the more distal PCA branches. Venous sinuses: Patent and normal. Anatomic variants: None significant. Review of the MIP images confirms the above findings IMPRESSION: 1. Acute infarction affecting several adjacent gyri in the left parietal region. Mild swelling but no mass effect or hemorrhage. 2. Severe stenosis or nonocclusive thrombus at the left MCA bifurcation affecting the inferior division. Nonocclusive embolus is visible within a left parietal branch as well. This would be M4. 3. Aortic atherosclerosis. 4. Soft plaque in the midportion of the left common carotid artery with stenosis of 25%. 5. Mild atherosclerotic change at the right carotid bifurcation but no stenosis. Aortic Atherosclerosis (ICD10-I70.0). Electronically Signed   By: Bettylou Brunner M.D.   On: 12/20/2023 14:52   MR BRAIN WO CONTRAST Result Date: 12/20/2023 CLINICAL DATA:  Provided history: Stroke, follow-up. Additional history provided: Vision changes, difficulty walking. EXAM: MRI HEAD WITHOUT CONTRAST TECHNIQUE: Multiplanar, multiecho pulse sequences of the brain and surrounding structures were obtained without intravenous contrast. COMPARISON:  Brain MRI 07/31/2022. FINDINGS: Brain: Mild generalized cerebral atrophy. Patchy acute cortical/subcortical infarcts within the left parietal and occipital lobes (MCA vascular territory). The largest individual infarct (located within the left parietal lobe) spans 3 cm. Subtle petechial hemorrhage associated with some of these infarcts. Additional punctate left MCA territory cortical acute infarct within the left insula. Chronic lacunar infarcts within the bilateral cerebral hemispheric white matter, some of  which are new from the prior brain MRI of 07/31/2022. Prominent perivascular spaces within the basal ganglia. Chronic lacunar infarct within the left aspect of the pons, new from the prior MRI. Background moderate multifocal T2 FLAIR hyperintense signal abnormality within the cerebral white matter and pons, nonspecific but compatible with chronic small vessel ischemic disease. No evidence of an intracranial mass. No extra-axial fluid collection. No midline shift. Vascular: Maintained flow voids  within the proximal large arterial vessels. Skull and upper cervical spine: No focal worrisome marrow lesion. Incompletely assessed cervical spondylosis. Mild grade 1 anterolisthesis at C3-C4 and C4-C5. Sinuses/Orbits: No mass or acute finding within the imaged orbits. Severe right maxillary sinusitis (with near complete sinus opacification). Mild mucosal thickening within the left sphenoid sinus. Mild right ethmoid sinusitis. IMPRESSION: 1. Patchy acute cortical/subcortical infarcts within the left parietal and occipital lobes individually measuring up to 3 cm (MCA vascular territory). Subtle petechial hemorrhage associated with some of these infarcts. No frank hemorrhagic conversion. 2. Additional punctate left MCA territory cortical acute infarct within the left insula. 3. Background parenchymal atrophy and chronic small vessel ischemic disease with chronic lacunar infarcts, as described. 4. Paranasal sinus disease as outlined (including severe right maxillary sinusitis). Electronically Signed   By: Bascom Lily D.O.   On: 12/20/2023 11:52    Labs:  Basic Metabolic Panel: Recent Labs  Lab 01/11/24 0718 01/15/24 0512  NA  --  138  K  --  4.5  CL  --  106  CO2  --  25  GLUCOSE  --  142*  BUN  --  33*  CREATININE 1.36* 1.54*  CALCIUM   --  9.3    CBC: No results for input(s): "WBC", "NEUTROABS", "HGB", "HCT", "MCV", "PLT" in the last 168 hours.   CBG: Recent Labs  Lab 01/16/24 2148 01/17/24 0655  01/17/24 1127 01/17/24 1623 01/17/24 2039  GLUCAP 183* 157* 219* 126* 152*   Family history.  Mother with melanoma father with lung cancer.  Denies any colon cancer or esophageal cancer or rectal cancer  Brief HPI:   Haley Jones is a 68 y.o. right-handed female with history significant for hypertension B12 deficiency type 2 diabetes mellitus hyperlipidemia CKD stage III depression.  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  She has several family members in the area who can provide assistance.  Presented to [facility] 12/20/2023 with right sided weakness falls and dizziness.  By report she had dizziness for the last 2 to 3 weeks she did see her PCP and was started on meclizine.  MRI of the brain showed patchy acute cortical subcortical infarct within the left parietal occipital lobes individually measuring up to 3 cm/MCA vascular territory.  Subtle petechial hemorrhage associated with some of these infarcts.  No frank hemorrhagic conversion.  Additional punctate left MCA territory cortical infarct within the left insula.  Background parenchymal atrophy and chronic small vessel disease with chronic lacunar infarct.  CT angiogram showed severe stenosis nonocclusive thrombus of the left MCA bifurcation affecting the inferior division.  Nonocclusive embolus also visible within the left parietal branch as well.  Admission chemistries unremarkable except glucose 262 creatinine 1.61 hemoglobin A1c 8.4.  Echocardiogram ejection fraction of 60 to 65% no wall motion abnormalities grade 1 diastolic dysfunction.  Neurology follow-up placed on aspirin  and Plavix  x 90 days then Plavix  alone.  Lovenox  added for DVT prophylaxis.  Permissive hypertension patient on Norvasc  and lisinopril  prior to admission resume as needed.  Therapy evaluations completed due to patient's decreased functional mobility and right-sided weakness was admitted for a comprehensive rehab  program.   Hospital Course: Haley Jones was admitted to rehab 12/28/2023 for inpatient therapies to consist of PT, ST and OT at least three hours five days a week. Past admission physiatrist, therapy team and rehab RN have worked together to provide customized collaborative inpatient rehab.  Pertaining to patient's left  MCA infarction left MCA occlusion remained stable.  She will continue aspirin  and Plavix  x 3 months then Plavix  alone follow-up neurology services.  Lovenox  for DVT prophylaxis.  No bleeding episodes.  Mood stabilization with the use of Effexor  as well as Klonopin  with emotional support provided.  Patient reports her daughter passed away around the time she was admitted planned for neuropsychology follow-up.  Blood sugars monitored hemoglobin A1c 8.4 and initially on insulin  therapy while in the hospital and resumed oral agents on discharge with Trulicity /Glucotrol/Jardiance and follow-up with PCP.  AKI on CKD stage III follow-up chemistries remained stable.  Permissive hypertension maintained on Norvasc  and and low-dose lisinopril  5 mg daily with close monitoring of renal function with latest creatinine 1.34.  Crestor  ongoing for hyperlipidemia.  Protonix  for GERD.  B12 deficiency outpatient B12 injection.  Chronic constipation with bowel program established.   Blood pressures were monitored on TID basis and remained controlled and monitored  Diabetes has been monitored with ac/hs CBG checks and SSI was use prn for tighter BS control.    Rehab course: During patient's stay in rehab weekly team conferences were held to monitor patient's progress, set goals and discuss barriers to discharge. At admission, patient required minimal assist rolling walker 10 feet minimal assist sit to stand  Physical exam.  Blood pressure 139/94 pulse 88 temperature 99.4 respirations 20 oxygen saturation 99% room air Constitutional.  No acute distress HEENT Head.  Normocephalic and  atraumatic Eyes.  Pupils round and reactive to light no discharge without nystagmus Neck.  Supple nontender no JVD without thyromegaly Cardiac regular rate and rhythm without any extra sounds or murmur heard Abdomen.  Soft nontender positive bowel sounds without rebound Respiratory effort normal no respiratory distress without wheeze Extremities.  No clubbing cyanosis or edema Neurologic.  Alert oriented x 3 right side neglect.  Provides name and age but some delay in decrease in attention. Motor.  Right upper extremity 4 -/5 deltoid 4/5 bicep 4/5 tricep 4/5 grip Left lower extremity 5/5 deltoid 5/5 bicep 5/5 tricep 5/5 grip Right lower extremity hip flexors 4/5 knee extension 4+/5 ADF 4+/5 APF 4+/5 Left lower extremity 5/5  He/She  has had improvement in activity tolerance, balance, postural control as well as ability to compensate for deficits. He/She has had improvement in functional use RUE/LUE  and RLE/LLE as well as improvement in awareness.  Working with energy conservation techniques.  Perform supine to sit slowly with minimal assist.  Performs sit to stand and stand step transfers from bed to wheelchair with moderate assist right side and cues for posture and safe positioning.  Patient brushes teeth at sink prior to leaving room working on right side coordination and attention.  Patient stands after several attempts with moderate cues for anterior weight shifting.  Ambulates 100 feet right hand-held assist.  During ADL she completed 5 feet of functional mobility with rolling walker to the mat with minimal assist right neglect limiting right grasp on the rolling walker.  She then sat edge of bed completed sit to stand with focus on proper motor pattern and anterior weight shift to reduce posterior bias.  Required minimal assist and mod facilitation.  During SLP sessions follow simple 1 and two-step directions given supervision.  She attended throughout focal task with minimal assist with task  duration of 20 minutes.  SLP introduced WRAP compensatory strategies for recall and provide examples of utilization.  Patient was challenged in paragraph retention.  Given a moderate level, SLP guided patient  in use of repetition and association strategies.  After a 15-minute delay patient recalled information and 100% accuracy supervision.  Full family teaching completed and plan discharge to home      .       Disposition:  Discharge disposition: 01-Home or Self Care        Diet: Diabetic diet  Special Instructions: No driving smoking or alcohol  Continue aspirin  and Plavix  x 3 months then Plavix  alone  Medications at discharge. 1.  Tylenol  as needed 2.  Aspirin  81 mg p.o. daily 3.  Klonopin  1 mg p.o. nightly 4.  Plavix  75 mg p.o. daily 5.  Protonix  40 mg daily 6.  MiraLAX  daily hold for loose stools 7.  Crestor  20 mg p.o. daily 8.  Effexor  37.5 mg daily 9.  Norvasc  10 mg daily 10.  Estrace 1 mg p.o. daily 11.  Antivert 12.5 mg twice daily 12.  Jardiance 10 mg daily 13.  Glucotrol XL 10 mg daily 14.  Lidoderm  patch as directed 15.  Multivitamin daily 16.  Voltaren  gel 2 g 4 times daily to affected area 17.  Trulicity  0.75 mg into the skin weekly 18.  Calcium  carbonate two tablets daily 19.  Vitamin B12 injection 1000 mcg into the muscle every 30 days 20.  Lisinopril  5 mg daily 21.  Myrbetriq 25 mg daily   30-35 minutes were spent completing discharge summary and discharge planning Discharge Instructions     Ambulatory referral to Neurology   Complete by: As directed    An appointment is requested in approximately: 4 weeks left MCA infarction   Ambulatory referral to Occupational Therapy   Complete by: As directed    Eval and treat   Ambulatory referral to Physical Medicine Rehab   Complete by: As directed    Moderate complexity follow-up 1 to 2 weeks left MCA infarction   Ambulatory referral to Physical Therapy   Complete by: As directed    Eval and  treat   Ambulatory referral to Speech Therapy   Complete by: As directed    Eval and treat        Follow-up Information     Cherri Corns C, DO Follow up.   Specialty: Physical Medicine and Rehabilitation Why: Office to call for appointment Contact information: [address] [place] [address] [phone]         Sari Cunning, MD Follow up.   Specialty: Internal Medicine Contact information: [phone] [facility] [address] [facility] [place] [facility] [place] [address] [phone]                 Signed: Everlyn Hockey Nishi Neiswonger 01/18/2024, 4:55 AM

## 2023-12-29 NOTE — Evaluation (Signed)
 Occupational Therapy Assessment and Plan  Patient Details  Name: Haley Jones MRN: [id number] Date of Birth: 12-31-1955  OT Diagnosis: cognitive deficits, disturbance of vision, and hemiplegia affecting dominant side Rehab Potential:   ELOS: 10 days in duration   Today's Date: 12/29/2023 OT Individual Time: 0100-0215 OT Individual Time Calculation (min): 75 min     Hospital Problem: Principal Problem:   Left middle cerebral artery stroke [facility])   Past Medical History:  Past Medical History:  Diagnosis Date   Adnexal mass    Anxiety    Chronic back pain    Depression    Diabetes mellitus without complication (HCC)    Endometriosis 03/12/2018   GERD (gastroesophageal reflux disease)    Heart murmur 02/2018   undetected until she was an adult. 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   Weight loss 12/06/2017   Past Surgical History:  Past Surgical History:  Procedure Laterality Date   ABDOMINAL HYSTERECTOMY  2008   ovaries also   CESAREAN SECTION  1994   COLONOSCOPY     COLONOSCOPY WITH PROPOFOL  N/A 02/12/2018   Procedure: COLONOSCOPY WITH PROPOFOL ;  Surgeon: Toledo, Alphonsus Jeans, MD;  Location: [facility] ENDOSCOPY;  Service: Gastroenterology;  Laterality: N/A;   ESOPHAGOGASTRODUODENOSCOPY (EGD) WITH PROPOFOL  N/A 02/12/2018   Procedure: ESOPHAGOGASTRODUODENOSCOPY (EGD) WITH PROPOFOL ;  Surgeon: Toledo, Alphonsus Jeans, MD;  Location: [facility] ENDOSCOPY;  Service: Gastroenterology;  Laterality: N/A;   EYE SURGERY Left 1973   muscle shortening to straighten cross eyes   LAPAROSCOPY N/A 02/20/2018   Procedure: LAPAROSCOPY DIAGNOSTIC, ( RESECTION OF RIGHT LOWER QUADRANT ABDOMINAL MASS);  Surgeon: Franki Isles, MD;  Location: [facility] ORS;  Service: General;  Laterality: N/A;   ROTATOR CUFF REPAIR Left 2011    Assessment & Plan Clinical Impression: Patient is a  Haley Jones is a 68 year old  left-handed female with history significant for hypertension, B12 deficiency, type 2 diabetes mellitus, hyperlipidemia, CKD stage III, depression.  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  She reports her daughter passed away on the day he was hospitalized.  She has several family members in the area who can provide assistance.  1 level home with ramped entrance.  [facility]ed to [facility]20/[facility] with  right side weakness/falls and dizziness.  By report she had had dizziness for the last 2 to 3 weeks she did see her PCP and was started on meclizine.  MRI of the brain showed patchy acute cortical/subcortical infarcts within the left parietal occipital lobes individually measuring up to 3 cm (MCA vascular territory).  Subtle petechial hemorrhage associated with some of these infarcts.  No frank hemorrhagic conversion.  Additional punctate left MCA territory cortical acute infarct within the left insula.  Background parenchymal atrophy and chronic small vessel ischemic disease with chronic lacunar infarcts.  CT angio showed severe stenosis/nonocclusive thrombus of the left MCA bifurcation affecting the inferior division.  Nonocclusive embolus also visible within the left parietal branch as well.  Admission chemistries unremarkable except glucose 262 creatinine 1.61, and latest hemoglobin A1c 8.4.  Echocardiogram ejection fraction of 60 to 65% no wall motion abnormalities grade 1 diastolic dysfunction.  Neurology follow-up placed on aspirin  81 mg daily and Plavix  75 mg daily x 90 days then Plavix  alone.  Lovenox  added for DVT prophylaxis.  Patient initially with permissive hypertension with Norvasc  and lisinopril  slowly to be  resumed.  Tolerating a regular consistency diet.  Therapy evaluations completed due to patient's right side weakness and decreased functional mobility was admitted for a comprehensive rehab program. Patient transferred to CIR on 12/28/2023 .    Patient  currently requires min to ModA with basic self-care skills secondary to muscle weakness, impaired timing and sequencing, abnormal tone, decreased coordination, and decreased motor planning, decreased visual acuity and field cut, and decreased safety awareness and decreased memory.  Prior to hospitalization, patient could complete BADL/IADL withModI to independent .  Patient will benefit from skilled intervention to decrease level of assist with basic self-care skills prior to discharge home with care partner.  Anticipate patient will require intermittent supervision and HHS to be determined at a later date.  OT - End of Session Activity Tolerance: Tolerates 30+ min activity with multiple rests Endurance Deficit: Yes Endurance Deficit Description: fatigues with standing mobility seated rest breaks needed at least every 5 minutes (Per chart review and during functional task performance) OT Assessment OT Patient demonstrates impairments in the following area(s): Balance;Vision;Cognition;Endurance;Safety;Perception;Motor OT Basic ADL's Functional Problem(s): Dressing;Grooming;Other (comment) (bilateral hand coordination for fine motor task performance and bilateral hand manipulation.) OT Advanced ADL's Functional Problem(s): Simple Meal Preparation;Light Housekeeping;Laundry (secondary to R neglect and visual field cut) OT Transfers Functional Problem(s): Other (comment) (currently requires Min -ModA coming into standing) OT Plan OT Intensity: Minimum of 1-2 x/day, 45 to 90 minutes OT Frequency: Total of 15 hours over 7 days of combined therapies OT Duration/Estimated Length of Stay: 10 days in duration OT Treatment/Interventions: Neuromuscular re-education;Self Care/advanced ADL retraining;Therapeutic Exercise;UE/LE Coordination activities;UE/LE Strength taining/ROM;Therapeutic Activities;Functional mobility training;Cognitive remediation/compensation OT Self Feeding Anticipated Outcome(s): Ind OT  Basic Self-Care Anticipated Outcome(s): ModI OT Toileting Anticipated Outcome(s): ModI OT Bathroom Transfers Anticipated Outcome(s): ModI OT Recommendation Patient destination: Home Follow Up Recommendations: Other (comment) (To be determine prior to d/c) Equipment Recommended: To be determined   OT Evaluation Precautions/Restrictions  Precautions Precautions: Fall;Other (comment) (R neglect and R visual field cut) Recall of Precautions/Restrictions: Impaired Restrictions Weight Bearing Restrictions Per Provider Order: No General Chart Reviewed: Yes Family/Caregiver Present: No Vital Signs Therapy Vitals Temp: 98.1 F (36.7 C) Temp Source: Oral Pulse Rate: 86 Resp: 18 BP: (!) 145/84 Patient Position (if appropriate): Sitting Oxygen Therapy SpO2: 96 % O2 Device: Room Air Patient Activity (if Appropriate): In chair Pain Pain Assessment Pain Scale: 0-10 Pain Score: 0-No pain Home Living/Prior Functioning Home Living Family/patient expects to be discharged to:: Private residence Living Arrangements: Alone, Children (was living w/ pt's daughter. daughter recently passed away) Available Help at Discharge: Family Type of Home: Mobile home Home Access: Ramped entrance (into the living room) Home Layout: One level Bathroom Shower/Tub: Walk-in shower (with grab bars and seat) Bathroom Toilet: Handicapped height (belonged to daughter) Additional Comments: daughter recently passed, pt was her caregiver as she had special needs, had a sitter that helped too and daughter went to adaptive day school  Lives With: Alone (resided with daughter who pt indicated passed away a week ago.) IADL History Homemaking Responsibilities: Yes Meal Prep Responsibility: Primary Laundry Responsibility: Primary Cleaning Responsibility: Primary Bill Paying/Finance Responsibility: Primary Shopping Responsibility: Primary Child Care Responsibility: Primary Current License: Yes Mode of  Transportation: Car (reports that she was driving prior to most recent expisode) Education: hs Prior Function Level of Independence: Independent with basic ADLs, Independent with homemaking with ambulation, Independent with gait, Independent with transfers (report using a rollator on occassion)  Able to Take Stairs?: Yes Driving: Yes 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 Vision Baseline Vision/History: 1 Wears glasses (Patient presents with R visual field cut) Ability to See in Adequate Light: 1 Impaired (with glasses in place) Tracking/Visual Pursuits: Decreased smoothness of horizontal tracking Convergence: Impaired - to be further tested in functional context Visual Fields: Right visual field deficit Perception    Praxis   Cognition Cognition Overall Cognitive Status: Within Functional Limits for tasks assessed (Patient able to follow simple command, responses were appropriate.) Arousal/Alertness: Awake/alert Memory: Impaired (short -term memory) Memory Impairment: Decreased recall of new information (BIMS for sock with cues) Attention: Focused;Sustained Focused Attention: Appears intact Sustained Attention: Impaired Sustained Attention Impairment: Verbal basic;Functional basic Awareness: Impaired (mainly appeared as though a novelty) Awareness Impairment: Intellectual impairment;Emergent impairment Executive Function: Initiating Reasoning: Appears intact Reasoning Impairment: Verbal complex Decision Making: Impaired Decision Making Impairment: Verbal complex;Functional complex Safety/Judgment: Impaired (challenges with safeyt secondary to R neglect) Comments: R inattention, decreased safety with mobility frequent mod cues for attention to R environment and R hand; repeating information throughout session Brief Interview for Mental Status (BIMS) Repetition of Three Words (First Attempt): 3 Temporal Orientation: Year: Correct Temporal Orientation:  Month: Accurate within 5 days Temporal Orientation: Day: Correct Recall: "Sock": Yes, after cueing ("something to wear") Recall: "Blue": Yes, no cue required Recall: "Bed": Yes, no cue required BIMS Summary Score: 14 Sensation Sensation Light Touch: Impaired by gross assessment (Primarily with the R side of the body with vision occluded.) Additional Comments: Patient requires cues for consistent attention to the R side of the body with additonal attention to process information on the right. Coordination Gross Motor Movements are Fluid and Coordinated: No Fine Motor Movements are Fluid and Coordinated: No (challenges wiht bilateral hand coordination and manipulaiton impacting functional task performance for UB/LB) Motor  Motor Motor: Hemiplegia Motor - Skilled Clinical Observations: R side weakness, inattention  Trunk/Postural Assessment  Cervical Assessment Cervical Assessment: Within Functional Limits Thoracic Assessment Thoracic Assessment: Exceptions to [facility] Lumbar Assessment Lumbar Assessment: Within Functional Limits Postural Control Postural Control: Deficits on evaluation Postural Limitations: R hemineglect  requires cues and assist for midline positioning secondary to delayed response impacting placement for  symmetry of  the  Rt and the Lt sides of the body.  Balance Balance Balance Assessed: Yes (sit and standing balance is challenged secondry R neglect) Static Sitting Balance Static Sitting - Balance Support: Feet supported Static Sitting - Level of Assistance: 5: Stand by assistance (Mod coming into posiiton, which transitions to MinA using the RW) Static Standing Balance Static Standing - Balance Support:  (Left and Right upper extremity suported using the RW assist) Static Standing - Level of Assistance:  (using the RW) Dynamic Standing Balance Dynamic Standing - Balance Support: Bilateral upper extremity supported Dynamic Standing - Level of Assistance: 3: Mod  assist Dynamic Standing - Balance Activities: Other (comment) (Patient able to stand for short period, but is met with challenges with work over time secondary to the neglect.) Extremity/Trunk Assessment RUE Assessment Passive Range of Motion (PROM) Comments: WFL Active Range of Motion (AROM) Comments: WFL (with additional time.) General Strength Comments: 3-/5 MMT LUE Assessment LUE Assessment: Within Functional Limits Passive Range of Motion (PROM) Comments: WFL Active Range of Motion (AROM) Comments: WFL General Strength Comments: 3/5MMT  Care Tool Care Tool Self Care Eating   Eating Assist Level: Set up assist    Oral Care    Oral Care Assist Level: Set up assist    Bathing   Body parts bathed by patient: Right  arm;Left arm;Chest;Abdomen;Front perineal area;Buttocks;Right upper leg;Left upper leg;Right lower leg;Left lower leg;Face     Assist Level: Supervision/Verbal cueing    Upper Body Dressing(including orthotics)   What is the patient wearing?: Pull over shirt   Assist Level: Minimal Assistance - Patient > 75%    Lower Body Dressing (excluding footwear)   What is the patient wearing?: Incontinence brief;Pants Assist for lower body dressing: Moderate Assistance - Patient 50 - 74%    Putting on/Taking off footwear   What is the patient wearing?: Non-skid slipper socks Assist for footwear: Minimal Assistance - Patient > 75%       Care Tool Toileting Toileting activity   Assist for toileting: Supervision/Verbal cueing     Care Tool Bed Mobility Roll left and right activity   Roll left and right assist level: Minimal Assistance - Patient > 75%    Sit to lying activity   Sit to lying assist level: Minimal Assistance - Patient > 75%    Lying to sitting on side of bed activity   Lying to sitting on side of bed assist level: the ability to move from lying on the back to sitting on the side of the bed with no back support.:  (Secondary to the R neglect)      Care Tool Transfers Sit to stand transfer   Sit to stand assist level: Moderate Assistance - Patient 50 - 74%    Chair/bed transfer   Chair/bed transfer assist level: Moderate Assistance - Patient 50 - 74%     Toilet transfer   Assist Level: Moderate Assistance - Patient 50 - 74%     Care Tool Cognition  Expression of Ideas and Wants Expression of Ideas and Wants: 3. Some difficulty - exhibits some difficulty with expressing needs and ideas (e.g, some words or finishing thoughts) or speech is not clear  Understanding Verbal and Non-Verbal Content Understanding Verbal and Non-Verbal Content: 3. Usually understands - understands most conversations, but misses some part/intent of message. Requires cues at times to understand   Memory/Recall Ability Memory/Recall Ability : Staff names and faces;That he or she is in a hospital/hospital unit   Refer to Care Plan for Long Term Goals  SHORT TERM GOAL WEEK 1 OT Short Term Goal 1 (Week 1): Patient will complete sit to stand using RW with close S OT Short Term Goal 2 (Week 1): The pt will complete UB/LB dressing with s/u A for UB and MinA for LB using the hemi technique OT Short Term Goal 3 (Week 1): The pt will demonstrate increase attention to the Rt Side of the body 80% of the time following intial cues and demostration. OT Short Term Goal 4 (Week 1): The pt will improve bilateral hand coordination by incorporating the RUE as a prime mover 80% of the time OT Short Term Goal 5 (Week 1): The pt wil partipate in cognitive activities with 80% accuracy involving  memory and with 1-5 steps iinstuction.  Recommendations for other services: None    Skilled Therapeutic Intervention   Patient seen this date for a skilled Occupational Therapy evaluation to determine a plan of care to address the pt's current deficits.    The pt presents with a willingness to participate and a desire to return to her previous LOF in relation to BADL/IADL task  performance at ModI to Ind with family support.  The pt, who is right hand dominate presents with a Rt neglect and Rt visual field cut impacting how she approach her  environment. The pt presents with an ability to consistently follow instruction for effective carryover as well as a willing to do the necessary work to improve her independence and reduce the burden of care for the care provider by deficits in function.  The pt currently presents with challenges with sit to stands, functional transfers, UB/LB dressing, inattention to the Rt side of the body, a right visual field cut, NMR of the RUE, core strength, and activity tolerance, which was observed with  her current functional status noted within the report. The pt currently presents as MinA to ModA during a BADL related task in bathing and dressing with opportunities to observe functional transfers to all surfaces, as well as,  the pt's perception and it influence on how she approaches her natural environment.  In my opinion, the pt would benefit from 10 days of skilled to improve her functional outcome towards goal attainment with the current POC in place. Modification in the plan of care to be made  as needed.   ADL ADL Equipment Provided:  (none) Eating: Set up Where Assessed-Eating: Wheelchair Grooming: Setup Where Assessed-Grooming: Sitting at sink Upper Body Bathing: Supervision/safety (close S) Where Assessed-Upper Body Bathing: Shower Lower Body Bathing: Supervision/safety (close S sitting) Where Assessed-Lower Body Bathing: Shower Upper Body Dressing: Minimal assistance Where Assessed-Upper Body Dressing: Wheelchair Lower Body Dressing: Moderate assistance Where Assessed-Lower Body Dressing: Wheelchair Toileting: Minimal assistance;Supervision/safety (for hyigene only) Where Assessed-Toileting: Other (Comment) (based on functional performance with bathing UB/LB) Toilet Transfer: Minimal assistance Toilet Transfer Method:  Proofreader: Grab bars;Other (comment) (Based on observation for coming from sit to stand using the grab bars and the arm of the w/c) Tub/Shower Transfer: Contact guard;Minimal assistance Tub/Shower Transfer Method: Ambulating Tub/Shower Equipment: Shower seat with back;Grab bars Film/video editor: Scientist, forensic Method: Designer, industrial/product: Shower seat with back Mobility  Bed Mobility Bed Mobility: Supine to Sit;Sit to Supine Supine to Sit: Minimal Assistance - Patient > 75%;Moderate Assistance - Patient 50-74% (Mod transition to MinA with adjustments in standing balance to prevent fall.) Sit to Supine: Minimal Assistance - Patient > 75% Transfers Sit to Stand: Moderate Assistance - Patient 50-74%   Discharge Criteria: Patient will be discharged from OT if patient refuses treatment 3 consecutive times without medical reason, if treatment goals not met, if there is a change in medical status, if patient makes no progress towards goals or if patient is discharged from hospital.  The above assessment, treatment plan, treatment alternatives and goals were discussed and mutually agreed upon: by patient  Moises Ang 12/29/2023, 5:52 PM

## 2023-12-29 NOTE — Plan of Care (Signed)
  Problem: RH Problem Solving Goal: LTG Patient will demonstrate problem solving for (SLP) Description: LTG:  Patient will demonstrate problem solving for basic/complex daily situations with cues  (SLP) Flowsheets (Taken 12/29/2023 1521) LTG: Patient will demonstrate problem solving for (SLP): Complex daily situations LTG Patient will demonstrate problem solving for: Minimal Assistance - Patient > 75%   Problem: RH Memory Goal: LTG Patient will use memory compensatory aids to (SLP) Description: LTG:  Patient will use memory compensatory aids to recall biographical/new, daily complex information with cues (SLP) Flowsheets (Taken 12/29/2023 1521) LTG: Patient will use memory compensatory aids to (SLP): Supervision   Problem: RH Attention Goal: LTG Patient will demonstrate this level of attention during functional activites (SLP) Description: LTG:  Patient will will demonstrate this level of attention during functional activites (SLP) Flowsheets (Taken 12/29/2023 1521) Patient will demonstrate during cognitive/linguistic activities the attention type of: Sustained Patient will demonstrate this level of attention during cognitive/linguistic activities in: Controlled LTG: Patient will demonstrate this level of attention during cognitive/linguistic activities with assistance of (SLP): Supervision   Problem: RH Awareness Goal: LTG: Patient will demonstrate awareness during functional activites type of (SLP) Description: LTG: Patient will demonstrate awareness during functional activites type of (SLP) Flowsheets (Taken 12/29/2023 1521) Patient will demonstrate during cognitive/linguistic activities awareness type of: Emergent LTG: Patient will demonstrate awareness during cognitive/linguistic activities with assistance of (SLP): Minimal Assistance - Patient > 75%

## 2023-12-29 NOTE — Plan of Care (Signed)
  Problem: RH BOWEL ELIMINATION Goal: RH STG MANAGE BOWEL WITH ASSISTANCE Description: STG Manage Bowel with Assistance. Outcome: Progressing   Problem: RH BLADDER ELIMINATION Goal: RH STG MANAGE BLADDER WITH ASSISTANCE Description: STG Manage Bladder With supervision Assistance Outcome: Progressing   Problem: RH SAFETY Goal: RH STG ADHERE TO SAFETY PRECAUTIONS W/ASSISTANCE/DEVICE Description: STG Adhere to Safety Precautions With supervision Assistance/Device. Outcome: Progressing   Problem: RH PAIN MANAGEMENT Goal: RH STG PAIN MANAGED AT OR BELOW PT'S PAIN GOAL Description: <4 w/ prns Outcome: Progressing

## 2023-12-29 NOTE — Discharge Instructions (Addendum)
 Inpatient Rehab Discharge Instructions  Haley Jones Discharge date and time: No discharge date for patient encounter.   Activities/Precautions/ Functional Status: Activity: As tolerated Diet: Diabetic diet Wound Care: Routine skin checks Functional status:  ___ No restrictions     ___ Walk up steps independently ___ 24/7 supervision/assistance   ___ Walk up steps with assistance ___ Intermittent supervision/assistance  ___ Bathe/dress independently ___ Walk with walker     _x__ Bathe/dress with assistance ___ Walk Independently    ___ Shower independently ___ Walk with assistance    ___ Shower with assistance ___ No alcohol     ___ Return to work/school ________  Special Instructions: No driving smoking or alcohol  Plan aspirin  81 mg daily and Plavix  75 mg day x 3 months initiated 12/20/2023 then Plavix  alone   COMMUNITY REFERRALS UPON DISCHARGE:    Outpatient: PT      OT     ST              Agency: [facility]      Phone: [phone]              Appointment Date/Time: *Please expect follow-up within 7-10 business days to schedule your appointment. If you have not received follow-up, be sure to contact the site directly.*    Medical Equipment/Items Ordered:youth rolling walker, 3in1 bedside commode, and shower chair with back                                                 Agency/Supplier: [employer] [phone]     My questions have been answered and I understand these instructions. I will adhere to these goals and the provided educational materials after my discharge from the hospital.  Patient/Caregiver Signature _______________________________ Date __________  Clinician Signature _______________________________________ Date __________  Please bring this form and your medication list with you to all your follow-up doctor's appointments. STROKE/TIA DISCHARGE INSTRUCTIONS SMOKING Cigarette smoking nearly doubles your risk of having a stroke  & is the single most alterable risk factor  If you smoke or have smoked in the last 12 months, you are advised to quit smoking for your health. Most of the excess cardiovascular risk related to smoking disappears within a year of stopping. Ask you doctor about anti-smoking medications [place] Quit Line: 1-800-QUIT NOW Free Smoking Cessation Classes [phone]  CHOLESTEROL Know your levels; limit fat & cholesterol in your diet  Lipid Panel     Component Value Date/Time   CHOL 170 12/20/2023 0909   TRIG 154 (H) 12/20/2023 0909   HDL 59 12/20/2023 0909   CHOLHDL 2.9 12/20/2023 0909   VLDL 31 12/20/2023 0909   LDLCALC 80 12/20/2023 0909     Many patients benefit from treatment even if their cholesterol is at goal. Goal: Total Cholesterol (CHOL) less than 160 Goal:  Triglycerides (TRIG) less than 150 Goal:  HDL greater than 40 Goal:  LDL (LDLCALC) less than 100   BLOOD PRESSURE American Stroke Association blood pressure target is less that 120/80 mm/Hg  Your discharge blood pressure is:  BP: 133/77 Monitor your blood pressure Limit your salt and alcohol intake Many individuals will require more than one medication for high blood pressure  DIABETES (A1c is a blood sugar average for last 3 months) Goal HGBA1c is under 7% (HBGA1c is blood sugar average for last 3  months)  Diabetes:    Lab Results  Component Value Date   HGBA1C 6.4 (H) 01/29/2019    Your HGBA1c can be lowered with medications, healthy diet, and exercise. Check your blood sugar as directed by your physician Call your physician if you experience unexplained or low blood sugars.  PHYSICAL ACTIVITY/REHABILITATION Goal is 30 minutes at least 4 days per week  Activity: Increase activity slowly, Therapies: Physical Therapy: Home Health Return to work:  Activity decreases your risk of heart attack and stroke and makes your heart stronger.  It helps control your weight and blood pressure; helps you relax and can improve your  mood. Participate in a regular exercise program. Talk with your doctor about the best form of exercise for you (dancing, walking, swimming, cycling).  DIET/WEIGHT Goal is to maintain a healthy weight  Your discharge diet is:  Diet Order             Diet Carb Modified Fluid consistency: Thin  Diet effective now                   liquids Your height is:  Height: 4\' 9"  (144.8 cm) Your current weight is: Weight: 70.7 kg Your Body Mass Index (BMI) is:  BMI (Calculated): 33.72 Following the type of diet specifically designed for you will help prevent another stroke. Your goal weight range is:   Your goal Body Mass Index (BMI) is 19-24. Healthy food habits can help reduce 3 risk factors for stroke:  High cholesterol, hypertension, and excess weight.  RESOURCES Stroke/Support Group:  Call [phone]   STROKE EDUCATION PROVIDED/REVIEWED AND GIVEN TO PATIENT Stroke warning signs and symptoms How to activate emergency medical system (call 911). Medications prescribed at discharge. Need for follow-up after discharge. Personal risk factors for stroke. Pneumonia vaccine given: No Flu vaccine given: No My questions have been answered, the writing is legible, and I understand these instructions.  I will adhere to these goals & educational materials that have been provided to me after my discharge from the hospital.

## 2023-12-29 NOTE — Evaluation (Signed)
 Physical Therapy Assessment and Plan  Patient Details  Name: Haley Jones MRN: [id number] Date of Birth: 02-11-1956  PT Diagnosis: Hemiparesis dominant, Impaired cognition, and Impaired sensation Rehab Potential: Good ELOS: 10 days   Today's Date: 12/29/2023 PT Individual Time: 2130-8657 PT Individual Time Calculation (min): 75 min    Hospital Problem: Principal Problem:   Left middle cerebral artery stroke [facility])   Past Medical History:  Past Medical History:  Diagnosis Date   Adnexal mass    Anxiety    Chronic back pain    Depression    Diabetes mellitus without complication (HCC)    Endometriosis 03/12/2018   GERD (gastroesophageal reflux disease)    Heart murmur 02/2018   undetected until she was an adult. 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   Weight loss 12/06/2017   Past Surgical History:  Past Surgical History:  Procedure Laterality Date   ABDOMINAL HYSTERECTOMY  2008   ovaries also   CESAREAN SECTION  1994   COLONOSCOPY     COLONOSCOPY WITH PROPOFOL  N/A 02/12/2018   Procedure: COLONOSCOPY WITH PROPOFOL ;  Surgeon: Toledo, Alphonsus Jeans, MD;  Location: [facility] ENDOSCOPY;  Service: Gastroenterology;  Laterality: N/A;   ESOPHAGOGASTRODUODENOSCOPY (EGD) WITH PROPOFOL  N/A 02/12/2018   Procedure: ESOPHAGOGASTRODUODENOSCOPY (EGD) WITH PROPOFOL ;  Surgeon: Toledo, Alphonsus Jeans, MD;  Location: [facility] ENDOSCOPY;  Service: Gastroenterology;  Laterality: N/A;   EYE SURGERY Left 1973   muscle 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  She reports her daughter passed away on the day he was hospitalized.  She has several family members in the area who can provide assistance.  1 level home with ramped entrance.  Presented to [facility] 12/20/2023 with  right side weakness/falls and dizziness.  By report she had had dizziness for the last 2 to 3 weeks she did see her PCP and was started on meclizine.  MRI of the brain showed patchy acute cortical/subcortical infarcts within the left parietal occipital lobes individually measuring up to 3 cm (MCA vascular territory).  Subtle petechial hemorrhage associated with some of these infarcts.  No frank hemorrhagic conversion.  Additional punctate left MCA territory cortical acute infarct within the left insula.  Background parenchymal atrophy and chronic small vessel ischemic disease with chronic lacunar infarcts.  CT angio showed severe stenosis/nonocclusive thrombus of the left MCA bifurcation affecting the inferior division.  Nonocclusive embolus also visible within the left parietal branch as well.  Admission chemistries unremarkable except glucose 262 creatinine 1.61, and latest hemoglobin A1c 8.4.  Echocardiogram ejection fraction of 60 to 65% no wall motion abnormalities grade 1 diastolic dysfunction.  Neurology follow-up placed on aspirin  81 mg daily and Plavix  75 mg daily x 90 days then Plavix  alone.  Lovenox  added for DVT prophylaxis.  Patient initially with permissive hypertension with Norvasc  and lisinopril  slowly to be resumed.  Tolerating a regular consistency diet.  Therapy evaluations completed  due to patient's right side weakness and decreased functional mobility was admitted for a comprehensive rehab program  Patient transferred to CIR on 12/28/2023 .   Patient currently requires mod with mobility secondary to  decreased coordination and decreased motor planning, field cut, and right side neglect.  Prior to hospitalization, patient was independent  with mobility and lived with Alone in a Mobile home home.  Home access is  Ramped entrance.  Patient will benefit from skilled PT intervention to maximize safe functional mobility and minimize fall risk for planned discharge home with 24 hour supervision.  Anticipate patient will benefit from follow up HH at discharge.  PT - End of Session Activity Tolerance: Tolerates 30+ min activity with multiple rests Endurance Deficit: Yes Endurance Deficit Description: fatigues with standing mobility seated rest breaks needed at least every 5 minutes PT Assessment Rehab Potential (ACUTE/IP ONLY): Good PT Barriers to Discharge: Decreased caregiver support;Other (comments) (daughter just passed away) PT Patient demonstrates impairments in the following area(s): Balance;Perception;Safety;Sensory;Motor PT Transfers Functional Problem(s): Bed Mobility;Bed to Chair;Car;Furniture PT Locomotion Functional Problem(s): Ambulation PT Plan PT Intensity: Minimum of 1-2 x/day ,45 to 90 minutes PT Frequency: 5 out of 7 days PT Duration Estimated Length of Stay: 10 days PT Treatment/Interventions: Ambulation/gait training;Community reintegration;DME/adaptive equipment instruction;Neuromuscular re-education;Stair training;UE/LE Strength taining/ROM;UE/LE Coordination activities;Visual/perceptual remediation/compensation;Therapeutic Exercise;Patient/family education;Functional mobility training;Balance/vestibular training;Therapeutic Activities PT Transfers Anticipated Outcome(s): Supervision PT Locomotion Anticipated Outcome(s): Supervision PT Recommendation Follow Up Recommendations: Home health PT Patient destination: Home Equipment Recommended: To be determined Equipment Details: states has her mother's rollator, may need youth RW   PT  Evaluation Precautions/Restrictions Precautions Precautions: Fall Recall of Precautions/Restrictions: Impaired General   Vital Signs  Pain Pain Assessment Pain Score: 0-No pain Pain Interference Pain Interference Pain Effect on Sleep: 3. Frequently Pain Interference with Therapy Activities: 2. Occasionally Pain Interference with Day-to-Day Activities: 2. Occasionally Home Living/Prior Functioning Home Living Available Help at Discharge: Family (sister and maybe her friend, Murlean Armour who was helping as Comptroller for her daughter) Type of Home: Mobile home Home Access: Ramped entrance Home Layout: One level Bathroom Shower/Tub: Health visitor: Handicapped height Additional Comments: daughter recently passed, pt was her caregiver as she had special needs, had a sitter that helped too and daughter went to adaptive day school  Lives With: Alone Prior Function Level of Independence: Independent with basic ADLs;Independent with homemaking with ambulation;Independent with gait;Independent with transfers  Able to Take Stairs?: Yes Driving: Yes 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 Vision/Perception  Perception Perception: Impaired Preception Impairment Details: Inattention/Neglect  Cognition Overall Cognitive Status: Impaired/Different from baseline Arousal/Alertness: Awake/alert Orientation Level: Oriented X4 Year: 2025 Month: May Day of Week: Correct Memory: Impaired Safety/Judgment: Impaired Comments: R inattention, decreased safety with mobility frequent mod cues for attention to R environment and R hand; repeating information throughout session Sensation Sensation Light Touch: Impaired by gross assessment Hot/Cold: Not tested Proprioception: Impaired by gross assessment Stereognosis: Not tested Additional Comments: decreased to light touch R LE compared to L and forgets where R hand is needs frequent cues Motor  Motor Motor:  Hemiplegia Motor - Skilled Clinical Observations: R side weakness, inattention   Trunk/Postural Assessment  Cervical Assessment Cervical Assessment: Within Functional Limits Thoracic Assessment Thoracic Assessment: Exceptions to [facility] (rounded shoulders) Lumbar Assessment Lumbar Assessment: Within Functional Limits Postural Control Postural Control: Deficits on evaluation Postural Limitations: R hemineglect cues and assist for midline positioning, tendency for L rotation  Balance Balance Balance Assessed: Yes Static Sitting Balance Static Sitting -  Balance Support: Feet supported Static Sitting - Level of Assistance: 5: Stand by assistance Static Sitting - Comment/# of Minutes: stool under feet on mat, scooting back on chair with UE support (for dynamic mobility) Static Standing Balance Static Standing - Balance Support: Left upper extremity supported;Bilateral upper extremity supported Static Standing - Level of Assistance: 4: Min assist Static Standing - Comment/# of Minutes: standing with RW and A for safety Dynamic Standing Balance Dynamic Standing - Balance Support: Bilateral upper extremity supported Dynamic Standing - Level of Assistance: 3: Mod assist Dynamic Standing - Balance Activities: Other (comment) Dynamic Standing - Comments: forward step up onto step with UE support and A for balance Extremity Assessment      RLE Assessment Active Range of Motion (AROM) Comments: WFL General Strength Comments: hip flexion 4-/5, knee extension 4/5, ankle DF 4+/5 LLE Assessment Active Range of Motion (AROM) Comments: AROM WFL General Strength Comments: strength hip flexion 4/5, knee extension 4+/5, ankle DF 4+/5  Care Tool Care Tool Bed Mobility Roll left and right activity   Roll left and right assist level: Minimal Assistance - Patient > 75%    Sit to lying activity   Sit to lying assist level: Minimal Assistance - Patient > 75%    Lying to sitting on side of bed activity    Lying to sitting on side of bed assist level: the ability to move from lying on the back to sitting on the side of the bed with no back support.: Minimal Assistance - Patient > 75%     Care Tool Transfers Sit to stand transfer   Sit to stand assist level: Moderate Assistance - Patient 50 - 74%    Chair/bed transfer   Chair/bed transfer assist level: Moderate Assistance - Patient 50 - 74%    Car transfer   Car transfer assist level: Moderate Assistance - Patient 50 - 74%      Care Tool Locomotion Ambulation   Assist level: Moderate Assistance - Patient 50 - 74% Assistive device: Walker-rolling Max distance: 150  Walk 10 feet activity   Assist level: Minimal Assistance - Patient > 75% Assistive device: Walker-rolling   Walk 50 feet with 2 turns activity   Assist level: Moderate Assistance - Patient - 50 - 74% Assistive device: Walker-rolling  Walk 150 feet activity   Assist level: Moderate Assistance - Patient - 50 - 74% Assistive device: Walker-rolling  Walk 10 feet on uneven surfaces activity   Assist level: Moderate Assistance - Patient - 50 - 74% Assistive device: Walker-rolling  Stairs   Assist level: Moderate Assistance - Patient - 50 - 74% Stairs assistive device: 2 hand rails Max number of stairs: 4  Walk up/down 1 step activity   Walk up/down 1 step (curb) assist level: Moderate Assistance - Patient - 50 - 74% Walk up/down 1 step or curb assistive device: 2 hand rails  Walk up/down 4 steps activity   Walk up/down 4 steps assist level: Moderate Assistance - Patient - 50 - 74% Walk up/down 4 steps assistive device: 2 hand rails  Walk up/down 12 steps activity Walk up/down 12 steps activity did not occur: Safety/medical concerns      Pick up small objects from floor Pick up small object from the floor (from standing position) activity did not occur: Safety/medical concerns      Wheelchair Is the patient using a wheelchair?: No          Wheel 50 feet with 2  turns activity  Wheel 150 feet activity        Refer to Care Plan for Long Term Goals  SHORT TERM GOAL WEEK 1 PT Short Term Goal 1 (Week 1): STG=LTG due to ELOS  Recommendations for other services: None   Skilled Therapeutic Intervention Patient in supine and performed supine to sit with A for R side to scoot forward and to aide in completing lifting of trunk.  She performed pivot transfer to wheelchair with mod A without assistive device.  She was assisted in wheelchair to therapy gym and performed ambulation with RW x 150' with min to mod A for direction, balance, cues for forward attention as tendency to stay twisted to L.  In ortho gym performed ramp with RW and mod A for walker management and safety with balance.  Car transfer to simulated small SUV with min to mod A for R LE and positioning.  Patient negotiated 4 steps (6") with rails and mod A and cues for foot placement and sequence.  Patient educated on evaluation procedures and need for initial 24 hours supervision at d/c.  Noted frequent repeated information on her history and issues during session.  Continued to need mod cues throughout for midline and R side attention.  Patient assisted in wheelchair to her room and left in chair for lunch and educated on next session right after lunch.  Mobility Bed Mobility Bed Mobility: Supine to Sit;Sit to Supine Supine to Sit: Minimal Assistance - Patient > 75% Sit to Supine: Minimal Assistance - Patient > 75% Transfers Transfers: Sit to [employer] Transfers Sit to Stand: Moderate Assistance - Patient 50-74% Stand Pivot Transfers: Moderate Assistance - Patient 50 - 74% Stand Pivot Transfer Details: Tactile cues for weight shifting;Verbal cues for precautions/safety Stand Pivot Transfer Details (indicate cue type and reason): assist for anterior weight shift and cue for hand placement, controlled stand to sit Transfer (Assistive device): Rolling walker Locomotion   Gait Ambulation: Yes Gait Assistance: Moderate Assistance - Patient 50-74% Gait Distance (Feet): 150 Feet Assistive device: Rolling walker;1 person hand held assist Gait Assistance Details: Manual facilitation for placement;Verbal cues for precautions/safety;Verbal cues for technique Gait Assistance Details: assist for R hand placement on walker, cues for foward gaze assist for balance and turns Gait Gait: Yes Gait Pattern: Impaired Gait Pattern: Decreased step length - right;Shuffle;Poor foot clearance - right;Wide base of support;Step-to pattern Stairs / Additional Locomotion Stairs: Yes Stairs Assistance: Moderate Assistance - Patient 50 - 74% Stair Management Technique: Two rails;Forwards Number of Stairs: 4 Height of Stairs: 6 Ramp: Moderate Assistance - Patient 50 - 74% Wheelchair Mobility Wheelchair Mobility: No   Discharge Criteria: Patient will be discharged from PT if patient refuses treatment 3 consecutive times without medical reason, if treatment goals not met, if there is a change in medical status, if patient makes no progress towards goals or if patient is discharged from hospital.  The above assessment, treatment plan, treatment alternatives and goals were discussed and mutually agreed upon: by patient  Marley Simmers 12/29/2023, 12:53 PM  Abigail Hoff, PT

## 2023-12-29 NOTE — Progress Notes (Signed)
 PROGRESS NOTE   Subjective/Complaints: Reports she feels tired after having multiple sessions of therapy today.  Reports mild chronic headache improved with Tylenol .  No additional concerns or complaints.  ROS: Patient denies fever, new vision changes, dizziness, nausea, vomiting, diarrhea,  shortness of breath or chest pain, or mood change. + Headache controlled with Tylenol   Objective:   No results found. Recent Labs    12/28/23 1557  WBC 5.0  HGB 11.7*  HCT 34.5*  PLT 207   Recent Labs    12/28/23 1557  CREATININE 1.30*    Intake/Output Summary (Last 24 hours) at 12/29/2023 1506 Last data filed at 12/29/2023 1259 Gross per 24 hour  Intake 742 ml  Output --  Net 742 ml        Physical Exam: Vital Signs Blood pressure 131/84, pulse 86, temperature 98.1 F (36.7 C), temperature source Oral, resp. rate 18, height 4\' 9"  (1.448 m), weight 70.7 kg, SpO2 97%.   General: No apparent distress, appears comfortable laying in bed HEENT: Head is normocephalic, atraumatic, sclera anicteric, oral mucosa pink and moist, dentition decreased Neck: Supple without JVD or lymphadenopathy Heart: Reg rate and rhythm. No murmurs rubs or gallops Chest: CTA bilaterally without wheezes, rales, or rhonchi; no distress Abdomen: Soft, non-tender, non-distended, bowel sounds positive. Extremities: No clubbing, cyanosis, or edema. Pulses are 2+ Psych: Pt's affect is appropriate. Pt is cooperative Skin: Bruises on her arms noted Neuro:    Mental Status: AAOx4,  Right sided neglect   Provides name and age but does display some decrease in attention and delay in processing.   Speech/Languate: Naming and repetition intact, fluent, follows simple commands. Mild dysarthria, no aphasia noted CRANIAL NERVES: II: PERRL.  Right homonymous hemianopsia III, IV, VI: EOM intact, right neglect V: normal sensation bilaterally VII: Right facial  weakness VIII: normal hearing to speech IX, X: normal palatal elevation XI: Shoulder shrug intact XII: Tongue midline     MOTOR: RUE: 4-/5 Deltoid, 4/5 Biceps, 4/5 Triceps,4/5 Grip LUE: 5/5 Deltoid, 5/5 Biceps, 5/5 Triceps, 5/5 Grip RLE: HF 4/5, KE 4+/5, ADF 4+/5, APF 4+/5 LLE: HF 5/5, KE 5/5, ADF 5/5, APF 5/5     SENSORY: Normal to touch all 4 extremities   Coordination: Finger-nose altered on the right   MSK: No joint swelling noted Increased tone particularly right knee extensors  Assessment/Plan: 1. Functional deficits which require 3+ hours per day of interdisciplinary therapy in a comprehensive inpatient rehab setting. Physiatrist is providing close team supervision and 24 hour management of active medical problems listed below. Physiatrist and rehab team continue to assess barriers to discharge/monitor patient progress toward functional and medical goals  Care Tool:  Bathing              Bathing assist       Upper Body Dressing/Undressing Upper body dressing        Upper body assist      Lower Body Dressing/Undressing Lower body dressing            Lower body assist       Toileting Toileting    Toileting assist       Transfers Chair/bed transfer  Transfers assist     Chair/bed transfer assist level: Moderate Assistance - Patient 50 - 74%     Locomotion Ambulation   Ambulation assist      Assist level: Moderate Assistance - Patient 50 - 74% Assistive device: Walker-rolling Max distance: 150   Walk 10 feet activity   Assist     Assist level: Minimal Assistance - Patient > 75% Assistive device: Walker-rolling   Walk 50 feet activity   Assist    Assist level: Moderate Assistance - Patient - 50 - 74% Assistive device: Walker-rolling    Walk 150 feet activity   Assist    Assist level: Moderate Assistance - Patient - 50 - 74% Assistive device: Walker-rolling    Walk 10 feet on uneven surface   activity   Assist     Assist level: Moderate Assistance - Patient - 50 - 74% Assistive device: Walker-rolling   Wheelchair     Assist Is the patient using a wheelchair?: No             Wheelchair 50 feet with 2 turns activity    Assist            Wheelchair 150 feet activity     Assist          Blood pressure 131/84, pulse 86, temperature 98.1 F (36.7 C), temperature source Oral, resp. rate 18, height 4\' 9"  (1.448 m), weight 70.7 kg, SpO2 97%.  Medical Problem List and Plan: 1. 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  Antithrombotics: -DVT/anticoagulation:  Pharmaceutical: Lovenox              -antiplatelet therapy: Aspirin  81 mg daily and Plavix  75 mg daily x 3 months then Plavix  alone 3. Pain Management: Tylenol  as needed  - 5/17 occasional headache control with Tylenol , continue to monitor 4. Mood/Behavior/Sleep: Effexor  37.5 mg daily, Klonopin  1 mg nightly             -antipsychotic agents: N/A             -Pt reports daughter passed away around the time she was admitted, consider neuropsych consult             -She is not interested in any adjustment to antidepressant medications at this time 5. Neuropsych/cognition: This patient is capable of making decisions on her own behalf. 6. Skin/Wound Care: Routine skin checks 7. Fluids/Electrolytes/Nutrition: Routine in and outs with follow-up chemistries 8.  Diabetes mellitus.  Hemoglobin A1c 8.4.  Presently on NovoLog  5 units 3 times daily, Semglee  10 units daily.  Check blood sugars AC and at bedtime.  Diabetic teaching.  Prior to admission patient on Trulicity  0.75 mg weekly, Amaryl  2 mg daily, Glucophage 500 mg in the morning and 1000 mg in the evening, Glucotrol XL 10 mg daily.  Monitor with increased activity CBG (last 3)  Recent Labs    12/28/23 2059  12/29/23 0617 12/29/23 1232  GLUCAP 166* 140* 133*  5/17 fair CBG control, continue current regimen and monitor  9.  AKI on CKD stage III.  Follow-up chemistries Recheck Monday 10.  Permissive hypertension.  Monitor with increased mobility.  Resume home Norvasc  5 mg daily, lisinopril  10 mg daily as  needed    12/29/2023    2:39 PM 12/29/2023    6:18 AM 12/29/2023    5:01 AM  Vitals with BMI  Weight   155 lbs 14 oz  BMI   33.72  Systolic 131 146   Diastolic 84 85   Pulse 86 77   Fair control continue current regimen and monitor  11.  Hyperlipidemia.  Crestor  12.  GERD.  Protonix  13.  B12 deficiency             - Outpatient B12 injection.  Try to determine when she had this last 14. Chronic constipation             -Reports BM yesterday             -Miralax  and senokot   -5/17 no BM yesterday continue to monitor bowel function      LOS: 1 days A FACE TO FACE EVALUATION WAS PERFORMED  Lylia Sand 12/29/2023, 3:06 PM

## 2023-12-30 DIAGNOSIS — I63512 Cerebral infarction due to unspecified occlusion or stenosis of left middle cerebral artery: Secondary | ICD-10-CM | POA: Diagnosis not present

## 2023-12-30 DIAGNOSIS — K5900 Constipation, unspecified: Secondary | ICD-10-CM | POA: Diagnosis not present

## 2023-12-30 DIAGNOSIS — E1169 Type 2 diabetes mellitus with other specified complication: Secondary | ICD-10-CM | POA: Diagnosis not present

## 2023-12-30 DIAGNOSIS — N179 Acute kidney failure, unspecified: Secondary | ICD-10-CM | POA: Diagnosis not present

## 2023-12-30 LAB — GLUCOSE, CAPILLARY
Glucose-Capillary: 111 mg/dL — ABNORMAL HIGH (ref 70–99)
Glucose-Capillary: 196 mg/dL — ABNORMAL HIGH (ref 70–99)
Glucose-Capillary: 157 mg/dL — ABNORMAL HIGH (ref 70–99)
Glucose-Capillary: 147 mg/dL — ABNORMAL HIGH (ref 70–99)

## 2023-12-30 NOTE — Progress Notes (Signed)
 PROGRESS NOTE   Subjective/Complaints: No new complaints this AM.  Resting comfortably in bed.  ROS: Patient denies fever, new vision changes, dizziness, nausea, vomiting, diarrhea,  shortness of breath or chest pain, or mood change. + Headache -improved  Objective:   No results found. Recent Labs    12/28/23 1557  WBC 5.0  HGB 11.7*  HCT 34.5*  PLT 207   Recent Labs    12/28/23 1557  CREATININE 1.30*    Intake/Output Summary (Last 24 hours) at 12/30/2023 1704 Last data filed at 12/30/2023 1224 Gross per 24 hour  Intake 1188 ml  Output --  Net 1188 ml        Physical Exam: Vital Signs Blood pressure (!) 143/80, pulse 89, temperature 98 F (36.7 C), temperature source Oral, resp. rate 18, height 4\' 9"  (1.448 m), weight 70.7 kg, SpO2 97%.   General: No apparent distress, appears comfortable laying in bed HEENT: Head is normocephalic, atraumatic, MMM Neck: Supple without JVD or lymphadenopathy Heart: Reg rate and rhythm. No murmurs rubs or gallops Chest: CTAB, nonlabored breathing Abdomen: Soft, non-tender, non-distended, bowel sounds positive. Extremities: No clubbing, cyanosis, or edema. Pulses are 2+ Psych: Pt's affect is appropriate. Pt is cooperative Skin: Bruises on her arms noted Neuro:    Mental Status: AAOx4,  Right sided neglect   Provides name and age but does display some decrease in attention and delay in processing.   Speech/Languate: Naming and repetition intact, fluent, follows simple commands. Mild dysarthria, no aphasia noted CRANIAL NERVES: II: PERRL.  Right homonymous hemianopsia III, IV, VI: EOM intact, right neglect V: normal sensation bilaterally VII: Right facial weakness VIII: normal hearing to speech IX, X: normal palatal elevation XI: Shoulder shrug intact XII: Tongue midline     MOTOR: RUE: 4-/5 Deltoid, 4/5 Biceps, 4/5 Triceps,4/5 Grip LUE: 5/5 Deltoid, 5/5 Biceps, 5/5  Triceps, 5/5 Grip RLE: HF 4/5, KE 4+/5, ADF 4+/5, APF 4+/5 LLE: HF 5/5, KE 5/5, ADF 5/5, APF 5/5     SENSORY: Normal to touch all 4 extremities   Coordination: Finger-nose altered on the right   MSK: No joint swelling noted Holds performing right hand  Assessment/Plan: 1. Functional deficits which require 3+ hours per day of interdisciplinary therapy in a comprehensive inpatient rehab setting. Physiatrist is providing close team supervision and 24 hour management of active medical problems listed below. Physiatrist and rehab team continue to assess barriers to discharge/monitor patient progress toward functional and medical goals  Care Tool:  Bathing    Body parts bathed by patient: Right arm, Left arm, Chest, Abdomen, Front perineal area, Buttocks, Right upper leg, Left upper leg, Right lower leg, Left lower leg, Face         Bathing assist Assist Level: Supervision/Verbal cueing     Upper Body Dressing/Undressing Upper body dressing   What is the patient wearing?: Pull over shirt    Upper body assist Assist Level: Minimal Assistance - Patient > 75%    Lower Body Dressing/Undressing Lower body dressing      What is the patient wearing?: Incontinence brief, Pants     Lower body assist Assist for lower body dressing: Moderate Assistance - Patient  50 - 74%     Toileting Toileting    Toileting assist Assist for toileting: Supervision/Verbal cueing     Transfers Chair/bed transfer  Transfers assist     Chair/bed transfer assist level: Moderate Assistance - Patient 50 - 74%     Locomotion Ambulation   Ambulation assist      Assist level: Moderate Assistance - Patient 50 - 74% Assistive device: Walker-rolling Max distance: 150   Walk 10 feet activity   Assist     Assist level: Minimal Assistance - Patient > 75% Assistive device: Walker-rolling   Walk 50 feet activity   Assist    Assist level: Moderate Assistance - Patient - 50 -  74% Assistive device: Walker-rolling    Walk 150 feet activity   Assist    Assist level: Moderate Assistance - Patient - 50 - 74% Assistive device: Walker-rolling    Walk 10 feet on uneven surface  activity   Assist     Assist level: Moderate Assistance - Patient - 50 - 74% Assistive device: Photographer Is the patient using a wheelchair?: No             Wheelchair 50 feet with 2 turns activity    Assist            Wheelchair 150 feet activity     Assist          Blood pressure (!) 143/80, pulse 89, temperature 98 F (36.7 C), temperature source Oral, resp. rate 18, height 4\' 9"  (1.448 m), weight 70.7 kg, SpO2 97%.  Medical Problem List and Plan: 1. 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  Antithrombotics: -DVT/anticoagulation:  Pharmaceutical: Lovenox              -antiplatelet therapy: Aspirin  81 mg daily and Plavix  75 mg daily x 3 months then Plavix  alone 3. Pain Management: Tylenol  as needed  - 5/17 occasional headache control with Tylenol , continue to monitor 4. Mood/Behavior/Sleep: Effexor  37.5 mg daily, Klonopin  1 mg nightly             -antipsychotic agents: N/A             -Pt reports daughter passed away around the time she was admitted, consider neuropsych consult             -She is not interested in any adjustment to antidepressant medications at this time 5. Neuropsych/cognition: This patient is capable of making decisions on her own behalf. 6. Skin/Wound Care: Routine skin checks 7. Fluids/Electrolytes/Nutrition: Routine in and outs with follow-up chemistries 8.  Diabetes mellitus.  Hemoglobin A1c 8.4.  Presently on NovoLog  5 units 3 times daily, Semglee  10 units daily.  Check blood sugars AC and at bedtime.  Diabetic teaching.  Prior to admission patient on  Trulicity  0.75 mg weekly, Amaryl  2 mg daily, Glucophage 500 mg in the morning and 1000 mg in the evening, Glucotrol XL 10 mg daily.  Monitor with increased activity CBG (last 3)  Recent Labs    12/30/23 0609 12/30/23 1140 12/30/23 1630  GLUCAP 111* 196* 157*  5/18, fair control continue current regimen and monitor trend  9.  AKI on CKD  stage III.  Follow-up chemistries Recheck Monday 10.  Permissive hypertension.  Monitor with increased mobility.  Resume home Norvasc  5 mg daily, lisinopril  10 mg daily as needed    12/30/2023    2:33 PM 12/30/2023    5:41 AM 12/29/2023    7:51 PM  Vitals with BMI  Systolic 143 183 161  Diastolic 80 89 82  Pulse 89 74 99  5/18 BP intermittently elevated, if continues consider increase Norvasc .  For now we will continue to monitor as do not want to cause hypotension/hypoperfusion  11.  Hyperlipidemia.  Crestor  12.  GERD.  Protonix  13.  B12 deficiency             - Outpatient B12 injection.  Try to determine when she had this last 14. Chronic constipation             -Reports BM yesterday             -Miralax  and senokot   -LBM 5/16, patient reports she does not feel constipated and does not have bowel movement every day.  Continue current regimen for now and monitor      LOS: 2 days A FACE TO FACE EVALUATION WAS PERFORMED  Haley Jones 12/30/2023, 5:04 PM

## 2023-12-31 DIAGNOSIS — I63512 Cerebral infarction due to unspecified occlusion or stenosis of left middle cerebral artery: Secondary | ICD-10-CM | POA: Diagnosis not present

## 2023-12-31 LAB — CBC WITH DIFFERENTIAL/PLATELET
Abs Immature Granulocytes: 0 10*3/uL (ref 0.00–0.07)
Basophils Absolute: 0 10*3/uL (ref 0.0–0.1)
Basophils Relative: 0 %
Eosinophils Absolute: 0.2 10*3/uL (ref 0.0–0.5)
Eosinophils Relative: 5 %
HCT: 36 % (ref 36.0–46.0)
Hemoglobin: 11.9 g/dL — ABNORMAL LOW (ref 12.0–15.0)
Immature Granulocytes: 0 %
Lymphocytes Relative: 39 %
Lymphs Abs: 1.8 10*3/uL (ref 0.7–4.0)
MCH: 31.2 pg (ref 26.0–34.0)
MCHC: 33.1 g/dL (ref 30.0–36.0)
MCV: 94.2 fL (ref 80.0–100.0)
Monocytes Absolute: 0.4 10*3/uL (ref 0.1–1.0)
Monocytes Relative: 8 %
Neutro Abs: 2.2 10*3/uL (ref 1.7–7.7)
Neutrophils Relative %: 48 %
Platelets: 203 10*3/uL (ref 150–400)
RBC: 3.82 MIL/uL — ABNORMAL LOW (ref 3.87–5.11)
RDW: 11.6 % (ref 11.5–15.5)
WBC: 4.6 10*3/uL (ref 4.0–10.5)
nRBC: 0 % (ref 0.0–0.2)

## 2023-12-31 LAB — COMPREHENSIVE METABOLIC PANEL WITH GFR
ALT: 53 U/L — ABNORMAL HIGH (ref 0–44)
AST: 37 U/L (ref 15–41)
Albumin: 2.9 g/dL — ABNORMAL LOW (ref 3.5–5.0)
Alkaline Phosphatase: 64 U/L (ref 38–126)
Anion gap: 9 (ref 5–15)
BUN: 26 mg/dL — ABNORMAL HIGH (ref 8–23)
CO2: 26 mmol/L (ref 22–32)
Calcium: 8.9 mg/dL (ref 8.9–10.3)
Chloride: 103 mmol/L (ref 98–111)
Creatinine, Ser: 1.39 mg/dL — ABNORMAL HIGH (ref 0.44–1.00)
GFR, Estimated: 42 mL/min — ABNORMAL LOW (ref >60)
Glucose, Bld: 150 mg/dL — ABNORMAL HIGH (ref 70–99)
Potassium: 4.1 mmol/L (ref 3.5–5.1)
Sodium: 138 mmol/L (ref 135–145)
Total Bilirubin: 0.5 mg/dL (ref 0.0–1.2)
Total Protein: 5.9 g/dL — ABNORMAL LOW (ref 6.5–8.1)

## 2023-12-31 LAB — GLUCOSE, CAPILLARY
Glucose-Capillary: 157 mg/dL — ABNORMAL HIGH (ref 70–99)
Glucose-Capillary: 145 mg/dL — ABNORMAL HIGH (ref 70–99)
Glucose-Capillary: 122 mg/dL — ABNORMAL HIGH (ref 70–99)
Glucose-Capillary: 125 mg/dL — ABNORMAL HIGH (ref 70–99)

## 2023-12-31 MED ORDER — SODIUM CHLORIDE 0.9 % IV SOLN: INTRAVENOUS | Status: AC

## 2023-12-31 NOTE — Progress Notes (Signed)
 PROGRESS NOTE   Subjective/Complaints: No acute complaints.  No events overnight. Mildly elevated blood pressure, otherwise vitals are stable. Blood sugars slightly elevated but consistently less than 200. BUN/creatinine elevated.  Otherwise, BMP and CBC stable. Does complain of worsening pain at nighttime, does not want pain medication escalated at this time  ROS: Patient denies fever, new vision changes, dizziness, nausea, vomiting, diarrhea,  shortness of breath or chest pain, or mood change. + Headache -improved  Objective:   No results found. Recent Labs    12/28/23 1557 12/31/23 0622  WBC 5.0 4.6  HGB 11.7* 11.9*  HCT 34.5* 36.0  PLT 207 203   Recent Labs    12/28/23 1557 12/31/23 0622  NA  --  138  K  --  4.1  CL  --  103  CO2  --  26  GLUCOSE  --  150*  BUN  --  26*  CREATININE 1.30* 1.39*  CALCIUM   --  8.9    Intake/Output Summary (Last 24 hours) at 12/31/2023 0809 Last data filed at 12/30/2023 1726 Gross per 24 hour  Intake 472 ml  Output --  Net 472 ml        Physical Exam: Vital Signs Blood pressure (!) 149/88, pulse 73, temperature 97.6 F (36.4 C), resp. rate 17, height 4\' 9"  (1.448 m), weight 70.7 kg, SpO2 96%.   General: No apparent distress, appears comfortable laying in bed. HEENT: Head is normocephalic, atraumatic, MMM Neck: Supple without JVD or lymphadenopathy Heart: Reg rate and rhythm. No murmurs rubs or gallops Chest: CTAB, nonlabored breathing Abdomen: Soft, non-tender, non-distended, bowel sounds positive. Extremities: No clubbing, cyanosis, or edema. Pulses are 2+ Psych: Pt's affect is appropriate. Pt is cooperative Skin: Bruises on her arms noted--healing Neuro:    Mental Status: AAOx4,  Right sided neglect   Provides name and age but does display some decrease in attention and delay in processing.   Speech/Languate: Naming and repetition intact, fluent, follows  simple commands. Mild dysarthria, no aphasia noted CRANIAL NERVES: II: PERRL.  Right homonymous hemianopsia III, IV, VI: EOM intact, right neglect V: normal sensation bilaterally VII: Right facial weakness VIII: normal hearing to speech IX, X: normal palatal elevation XI: Shoulder shrug intact XII: Tongue midline     MOTOR: RUE: 4-/5 Deltoid, 4/5 Biceps, 4/5 Triceps,4/5 Grip LUE: 5/5 Deltoid, 5/5 Biceps, 5/5 Triceps, 5/5 Grip RLE: HF 4/5, KE 4+/5, ADF 4+/5, APF 4+/5 LLE: HF 5/5, KE 5/5, ADF 5/5, APF 5/5   Unchanged exam 5-19   SENSORY: Normal to touch all 4 extremities   Coordination: Finger-nose altered on the right slightly   MSK: No joint swelling noted Full passive range of motion all 4 extremities  Assessment/Plan: 1. Functional deficits which require 3+ hours per day of interdisciplinary therapy in a comprehensive inpatient rehab setting. Physiatrist is providing close team supervision and 24 hour management of active medical problems listed below. Physiatrist and rehab team continue to assess barriers to discharge/monitor patient progress toward functional and medical goals  Care Tool:  Bathing    Body parts bathed by patient: Right arm, Left arm, Chest, Abdomen, Front perineal area, Buttocks, Right upper leg, Left upper  leg, Right lower leg, Left lower leg, Face         Bathing assist Assist Level: Supervision/Verbal cueing     Upper Body Dressing/Undressing Upper body dressing   What is the patient wearing?: Pull over shirt    Upper body assist Assist Level: Minimal Assistance - Patient > 75%    Lower Body Dressing/Undressing Lower body dressing      What is the patient wearing?: Incontinence brief, Pants     Lower body assist Assist for lower body dressing: Moderate Assistance - Patient 50 - 74%     Toileting Toileting    Toileting assist Assist for toileting: Supervision/Verbal cueing     Transfers Chair/bed transfer  Transfers  assist     Chair/bed transfer assist level: Moderate Assistance - Patient 50 - 74%     Locomotion Ambulation   Ambulation assist      Assist level: Moderate Assistance - Patient 50 - 74% Assistive device: Walker-rolling Max distance: 150   Walk 10 feet activity   Assist     Assist level: Minimal Assistance - Patient > 75% Assistive device: Walker-rolling   Walk 50 feet activity   Assist    Assist level: Moderate Assistance - Patient - 50 - 74% Assistive device: Walker-rolling    Walk 150 feet activity   Assist    Assist level: Moderate Assistance - Patient - 50 - 74% Assistive device: Walker-rolling    Walk 10 feet on uneven surface  activity   Assist     Assist level: Moderate Assistance - Patient - 50 - 74% Assistive device: Walker-rolling   Wheelchair     Assist Is the patient using a wheelchair?: No             Wheelchair 50 feet with 2 turns activity    Assist            Wheelchair 150 feet activity     Assist          Blood pressure (!) 149/88, pulse 73, temperature 97.6 F (36.4 C), resp. rate 17, height 4\' 9"  (1.448 m), weight 70.7 kg, SpO2 96%.  Medical Problem List and Plan: 1. 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  Antithrombotics: -DVT/anticoagulation:  Pharmaceutical: Lovenox              -antiplatelet therapy: Aspirin  81 mg daily and Plavix  75 mg daily x 3 months then Plavix  alone 3. Pain Management: Tylenol  as needed  - 5/17 occasional headache control with Tylenol , continue to monitor  5/19: Pain worse nightly, however patient wishes to continue with repositioning and as needed medications.  No additions at this time.  4. Mood/Behavior/Sleep: Effexor  37.5 mg daily, Klonopin  1 mg nightly             -antipsychotic agents: N/A             -Pt reports  daughter passed away around the time she was admitted, consider neuropsych consult             -She is not interested in any adjustment to antidepressant medications at this time 5. Neuropsych/cognition: This patient is capable of making decisions on her own behalf. 6. Skin/Wound Care: Routine skin  checks 7. Fluids/Electrolytes/Nutrition: Routine in and outs with follow-up chemistries 8.  Diabetes mellitus.  Hemoglobin A1c 8.4.  Presently on NovoLog  5 units 3 times daily, Semglee  10 units daily.  Check blood sugars AC and at bedtime.  Diabetic teaching.  Prior to admission patient on Trulicity  0.75 mg weekly, Amaryl  2 mg daily, Glucophage 500 mg in the morning and 1000 mg in the evening, Glucotrol XL 10 mg daily.  Monitor with increased activity CBG (last 3)  Recent Labs    12/30/23 1630 12/30/23 2144 12/31/23 0554  GLUCAP 157* 147* 157*  5/18-19 fair control continue current regimen and monitor trend  9.  AKI on CKD stage III.  Follow-up chemistries -5-19: BUN/creatinine elevated from 17/1 0.19-26/1.39.  P.o. intake seem adequate, continue to encourage and will give 500 cc bolus today.  Repeat labs in AM.  No obvious medication contributions but may need to just adjust venlafaxine  if worsening.  10.  Permissive hypertension.  Monitor with increased mobility.  Resume home Norvasc  5 mg daily, lisinopril  10 mg daily as needed    12/31/2023    5:50 AM 12/30/2023    7:28 PM 12/30/2023    2:33 PM  Vitals with BMI  Systolic 149 133 161  Diastolic 88 77 80  Pulse 73 92 89  5/18 BP intermittently elevated, if continues consider increase Norvasc .  For now we will continue to monitor as do not want to cause hypotension/hypoperfusion 5-19: BP currently in good range, monitor  11.  Hyperlipidemia.  Crestor  12.  GERD.  Protonix  13.  B12 deficiency             - Outpatient B12 injection.  Try to determine when she had this last 14. Chronic constipation             -Reports BM yesterday              -Miralax  and senokot   -LBM 5/16, patient reports she does not feel constipated and does not have bowel movement every day.  Continue current regimen for now and monitor      LOS: 3 days A FACE TO FACE EVALUATION WAS PERFORMED  Bea Lime 12/31/2023, 8:09 AM

## 2023-12-31 NOTE — Progress Notes (Signed)
 Met with patient to review current situation, team conference  and plan of care. Reviewed medications, dual antiplatelet therapy, DM, HTN, HLD, depression , return to PCP, bowel, bladder, pain and sleep. Continue to follow along to provide educational needs to facilitate preparation for discharge.

## 2023-12-31 NOTE — Progress Notes (Signed)
 Speech Language Pathology Daily Session Note  Patient Details  Name: Haley Jones MRN: [id number] Date of Birth: 05-28-1956  Today's Date: 12/31/2023 SLP Individual Time: 1004-1103 SLP Individual Time Calculation (min): 59 min  Short Term Goals: Week 1: SLP Short Term Goal 1 (Week 1): Patient will demonstrate problem solving in mildly complex situations given min multimodal A SLP Short Term Goal 2 (Week 1): Patient will sustain attention to task for 20 minutes given min assist with limited tangential interruptions. SLP Short Term Goal 3 (Week 1): Patient will utilize external and internal memory strategies to recall new daily information with 80% accuracy given min assist. SLP Short Term Goal 4 (Week 1): Patient will demonstrate intellectual awareness of deficits by stating 2 cognitive deficits given mod verbal cue  Skilled Therapeutic Interventions:  Patient was seen in am to address cognitive re- training. Pt was alert and seated upright in WC upon SLP arrival. Pt verbalizing incontinence of urine and noted to be soiled through gown and on to floor. SLP providing peri care and assisted pt in changing clothes. Pt able to follow simple 1-2 step directions given supervision. She attended throughout functional task with min A with task duration of ~ 20 minutes. SLP introduced WRAP compensatory strategies for recall and provided examples of utilization. Pt was challenged in paragraph retention. Given a moderate level paragraph, SLP guided pt in use of repetition and association strategies. After a 15 minute delay, pt recalled information with 100% acc supervision. In other minutes of session, SLP initiated medication management. Pt able to recall most of meds taken PTA. SLP reviewed current medication list and rationale. She was subsequently challenged in interpretation of prescription labels. Pt completed task with 67% acc indep improving to 100% acc with sup A. At conclusion of session pt was  left upright in bed with call button reach. SLP to continue POC.   Pain Pain Assessment Pain Scale: 0-10 Pain Score: 4  Pain Location: Back Pain Intervention(s): Medication (See eMAR)  Therapy/Group: Individual Therapy  Adela Holter 12/31/2023, 10:57 AM

## 2023-12-31 NOTE — Progress Notes (Signed)
 Inpatient Rehabilitation  Patient information reviewed and entered into eRehab system by [facility]. Karen Kays., CCC/SLP, PPS Coordinator.  Information including medical coding, functional ability and quality indicators will be reviewed and updated through discharge.

## 2023-12-31 NOTE — Progress Notes (Signed)
 Inpatient Rehabilitation Center Individual Statement of Services  Patient Name:  Haley Jones  Date:  12/31/2023  Welcome to the Inpatient Rehabilitation Center.  Our goal is to provide you with an individualized program based on your diagnosis and situation, designed to meet your specific needs.  With this comprehensive rehabilitation program, you will be expected to participate in at least 3 hours of rehabilitation therapies Monday-Friday, with modified therapy programming on the weekends.  Your rehabilitation program will include the following services:  Physical Therapy (PT), Occupational Therapy (OT), Speech Therapy (ST), 24 hour per day rehabilitation nursing, Therapeutic Recreaction (TR), Neuropsychology, Care Coordinator, Rehabilitation Medicine, and Pharmacy Services  Weekly team conferences will be held on Tuesdays to discuss your progress.  Your Inpatient Rehabilitation Care Coordinator will talk with you frequently to get your input and to update you on team discussions.  Team conferences with you and your family in attendance may also be held.  Expected length of stay: 10-14 days  Overall anticipated outcome: Supervision  Depending on your progress and recovery, your program may change. Your Inpatient Rehabilitation Care Coordinator will coordinate services and will keep you informed of any changes. Your Inpatient Rehabilitation Care Coordinator's name and contact numbers are listed  below.  The following services may also be recommended but are not provided by the Inpatient Rehabilitation Center:   Home Health Rehabiltiation Services Outpatient Rehabilitation Services  Arrangements will be made to provide these services after discharge if needed.  Arrangements include referral to agencies that provide these services.  Your insurance has been verified to be:  [employer] Your primary doctor is:  Firman Hughes, MD  Pertinent information will be shared with your doctor  and your insurance company.  Inpatient Rehabilitation Care Coordinator:  Kathey Pang [phone] or [phone]  Information discussed with and copy given to patient by: Naoma Bacca, 12/31/2023, 12:05 PM

## 2023-12-31 NOTE — Progress Notes (Addendum)
 Inpatient Rehabilitation Care Coordinator Assessment and Plan Patient Details  Name: Haley Jones MRN: [id number] Date of Birth: [birth year]  Today's Date: 12/31/2023  Hospital Problems: Principal Problem:   Left middle cerebral artery stroke [facility])  Past Medical History:  Past Medical History:  Diagnosis Date   Adnexal mass    Anxiety    Chronic back pain    Depression    Diabetes mellitus without complication (HCC)    Endometriosis 03/12/2018   GERD (gastroesophageal reflux disease)    Heart murmur 02/2018   undetected until she was an adult. 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   Weight loss 12/06/2017   Past Surgical History:  Past Surgical History:  Procedure Laterality Date   ABDOMINAL HYSTERECTOMY  2008   ovaries also   CESAREAN SECTION  1994   COLONOSCOPY     COLONOSCOPY WITH PROPOFOL  N/A 02/12/2018   Procedure: COLONOSCOPY WITH PROPOFOL ;  Surgeon: Toledo, Alphonsus Jeans, MD;  Location: [facility] ENDOSCOPY;  Service: Gastroenterology;  Laterality: N/A;   ESOPHAGOGASTRODUODENOSCOPY (EGD) WITH PROPOFOL  N/A 02/12/2018   Procedure: ESOPHAGOGASTRODUODENOSCOPY (EGD) WITH PROPOFOL ;  Surgeon: Toledo, Alphonsus Jeans, MD;  Location: [facility] ENDOSCOPY;  Service: Gastroenterology;  Laterality: N/A;   EYE SURGERY Left 1973   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 She has never used smokeless tobacco. She reports that she does not drink alcohol and does not use drugs.  Family / Support Systems  Sister: Evonne Hoist (lives nearby)  Social History Preferred language: English Religion: None   Associates Degree  in [employer]; worked for [employer]  Abuse/Neglect Abuse/Neglect Assessment Can Be Completed: Yes Physical Abuse: Denies Verbal Abuse: Denies Sexual Abuse: Denies Exploitation of patient/patient's resources: Denies Self-Neglect: Denies  Patient response to: Social Isolation - How often do you feel lonely or isolated from those around you?: Rarely  Emotional Status  Emotionally in good spirits post CVA.  Little memory of events leading to stroke. Reports recent death of daughter (she was the caregiver) within the past 2 weeks. She reports she is at peace with her daughter's death.  Patient / Family Perceptions, Expectations & Goals  Reports she would like to be as independent as possible; able to walk and not fall (recent fall history). Wants to get stronger; right side hemiparesis. Sister able to provide supervision and transportation at discharge.  Community Resources  N/A  Discharge Planning Living Arrangements: Alone, Children (was living w/ pt's daughter. daughter recently passed away) Support Systems: Other relatives (siblings) Type of Residence: Private residence Does the patient have any problems obtaining your medications?: No  Clinical Impression Patient admitted post CVA with right hemiparesis. Goal is to return home as independent as possible with supervision provided and prn assist from sister who lives nearby.  Prefers OP follow up if needed; level home with ramped entry.   Forrestine Ike B 12/31/2023, 11:55 AM

## 2023-12-31 NOTE — IPOC Note (Signed)
 Overall Plan of Care [facility]) Patient Details Name: Haley Jones MRN: [id number] DOB: 1956/02/08  Admitting Diagnosis: Left middle cerebral artery stroke [facility])  Hospital Problems: Principal Problem:   Left middle cerebral artery stroke [facility])     Functional Problem List: Nursing Bladder, Bowel, Edema, Endurance, Medication Management, Safety  PT Balance, Perception, Safety, Sensory, Motor  OT Balance, Vision, Cognition, Endurance, Safety, Perception, Motor  SLP Cognition  TR         Basic ADL's: OT Dressing, Grooming, Other (comment) (bilateral hand coordination for fine motor task performance and bilateral hand manipulation.)     Advanced  ADL's: OT Simple Meal Preparation, Light Housekeeping, Laundry (secondary to R neglect and visual field cut)     Transfers: PT Bed Mobility, Bed to Chair, Car, [employer]  OT Other (comment) (currently requires Min -ModA coming into standing)     Locomotion: PT Ambulation     Additional Impairments: OT    SLP Social Cognition   Problem Solving, Memory, Attention, Awareness  TR      Anticipated Outcomes Item Anticipated Outcome  Self Feeding Ind  Swallowing      Basic self-care  ModI  Toileting  ModI   Bathroom Transfers ModI  Bowel/Bladder  manage bladder with timed toileting/ manage bowels with medications  Transfers  Supervision  Locomotion  Supervision  Communication     Cognition  Min A  Pain  <4 w/ prns  Safety/Judgment  manage safety with supervision   Therapy Plan: PT Intensity: Minimum of 1-2 x/day ,45 to 90 minutes PT Frequency: 5 out of 7 days PT Duration Estimated Length of Stay: 10 days OT Intensity: Minimum of 1-2 x/day, 45 to 90 minutes OT Frequency: Total of 15 hours over 7 days of combined therapies OT Duration/Estimated Length of Stay: 10 days in duration SLP Intensity: Minumum of 1-2 x/day, 30 to 90 minutes SLP Frequency: 3 to 5 out of 7 days SLP Duration/Estimated Length of Stay:  10-12 days   Team Interventions: Nursing Interventions Patient/Family Education, Bladder Management, Bowel Management, Disease Management/Prevention, Discharge Planning, Medication Management  PT interventions Ambulation/gait training, Community reintegration, DME/adaptive equipment instruction, Neuromuscular re-education, Stair training, UE/LE Strength taining/ROM, UE/LE Coordination activities, Visual/perceptual remediation/compensation, Therapeutic Exercise, Patient/family education, Functional mobility training, Warden/ranger, Therapeutic Activities  OT Interventions Neuromuscular re-education, Self Care/advanced ADL retraining, Therapeutic Exercise, UE/LE Coordination activities, UE/LE Strength taining/ROM, Therapeutic Activities, Functional mobility training, Cognitive remediation/compensation  SLP Interventions Cognitive remediation/compensation, Environmental controls, Functional tasks, Internal/external aids, Patient/family education  TR Interventions    SW/CM Interventions Discharge Planning, Psychosocial Support, Patient/Family Education   Barriers to Discharge MD  Medical stability, Home enviroment access/loayout, and Lack of/limited family support  Nursing Decreased caregiver support, Home environment access/layout, Incontinence Discharge: House  Discharge Home Layout: One level  Discharge Home Access: Ramped entrance  PT Decreased caregiver support, Other (comments) (daughter just passed away)    OT      SLP      SW       Team Discharge Planning: Destination: PT-Home ,OT- Home , SLP-Home Projected Follow-up: PT-Home health PT, OT-  Other (comment) (To be determine prior to d/c), SLP-24 hour supervision/assistance, Home Health SLP, Outpatient SLP Projected Equipment Needs: PT-To be determined, OT- To be determined, SLP-None recommended by SLP Equipment Details: PT-states has her mother's rollator, may need youth RW, OT-  Patient/family involved in discharge  planning: PT- Patient,  OT-Patient, SLP-Patient  MD ELOS: 10-12 days Medical Rehab Prognosis:  Good Assessment: The patient has been admitted  for CIR therapies with the diagnosis of CVA. The team will be addressing functional mobility, strength, stamina, balance, safety, adaptive techniques and equipment, self-care, bowel and bladder mgt, patient and caregiver education, . Goals have been set at supervision. Anticipated discharge destination is home.       See Team Conference Notes for weekly updates to the plan of care

## 2023-12-31 NOTE — Progress Notes (Signed)
 Physical Therapy Session Note  Patient Details  Name: Haley Jones MRN: [id number] Date of Birth: Jun 21, 1956  Today's Date: 12/31/2023 PT Individual Time: 0805-0859 PT Individual Time Calculation (min): 54 min   Short Term Goals: Week 1:  PT Short Term Goal 1 (Week 1): STG=LTG due to ELOS  Skilled Therapeutic Interventions/Progress Updates:     Pt received semi reclined in bed and agrees to therapy. Reports some pain in low back. Number not provided. PT provides rest breaks as needed to manage pain. Pt performs supine to sit slowly with minA and cues for sequencing and positioning at EOB. Pt performs sit to stand and stand step transfer from bed to [facility] with modA at Rt side and cues for posture and safe positioning. Pt brushes teeth at sink prior to leaving room, working on Rt sided coordination and attention. WC transport to gym. Pt stands after several attempts with modA and cues for anterior weight shifting. Pt ambulates x100' with Rt HHA, with modA and cues for increasing stride length and gait speed to improve balance. Following rest break, pt performs stand foot taps on 4" step with mirror provided for visual feedback. Pt completes 2x20 with seated rest break. PT provides minA and cues for posture and lateral weight shifting. WC transport back to room. Left seated with alarm intact and all needs within reach.   Therapy Documentation Precautions:  Precautions Precautions: Fall, Other (comment) (R neglect and R visual field cut) Recall of Precautions/Restrictions: Impaired Restrictions Weight Bearing Restrictions Per Provider Order: No   Therapy/Group: Individual Therapy  Neva Barban, PT DPT 12/31/2023, 4:24 PM

## 2023-12-31 NOTE — Progress Notes (Signed)
 Occupational Therapy Session Note  Patient Details  Name: Haley Jones MRN: [id number] Date of Birth: 1956/07/28  Today's Date: 12/31/2023 OT Individual Time: 7829-5621 OT Individual Time Calculation (min): 71 min    Short Term Goals: Week 1:  OT Short Term Goal 1 (Week 1): Patient will complete sit to stand using RW with close S OT Short Term Goal 2 (Week 1): The pt will complete UB/LB dressing with s/u A for UB and MinA for LB using the hemi technique OT Short Term Goal 3 (Week 1): The pt will demonstrate increase attention to the Rt Side of the body 80% of the time following intial cues and demostration. OT Short Term Goal 4 (Week 1): The pt will improve bilateral hand coordination by incorporating the RUE as a prime mover 80% of the time OT Short Term Goal 5 (Week 1): The pt wil partipate in cognitive activities with 80% accuracy involving  memory and with 1-5 steps iinstuction.  Skilled Therapeutic Interventions/Progress Updates:    Pt received supine with no c/o pain, agreeable to OT session. She came to EOB with mod A for R sided management/trunk elevation. Stand pivot with min A to the w/c toward the R side. Pt was taken via w/c to the therapy gym for time management. She completed 5 ft of functional mobility with the RW to the mat with min A. R neglect limiting R grasp on the RW but she is able to attend to the R side with cueing. She transitioned into supine on the mat to work on bimanual integration, functional reach holding a 3lb dowel. Ataxia/muscle imbalance requiring facilitation at the elbow. She then sat EOM and completed sit <> stand with focus on proper motor pattern and anterior weight shift to reduce posterior bias. She required min A and mod facilitation, 2x8 repetitions. She then completed bimanual integration reaching tasks in standing holding a large ball and crossing midline into scaption with mod facilitation at the elbow to achieve 90 degrees of shoulder flexion.  Pt completed the BUE ergometer to challenge RUE strength, coordination, and endurance needed to complete ADLs and IADLs with the highest level of independence. Pt completed 4 min before needing a rest break, then ended with 4 more min before needing cueing for pacing and technique. Pt returned to their room following. She was left supine with all needs met, bed alarm set.   Therapy Documentation Precautions:  Precautions Precautions: Fall, Other (comment) (R neglect and R visual field cut) Recall of Precautions/Restrictions: Impaired Restrictions Weight Bearing Restrictions Per Provider Order: No  Therapy/Group: Individual Therapy  [name] 12/31/2023, 6:13 AM

## 2023-12-31 NOTE — Plan of Care (Signed)
  Problem: RH SAFETY Goal: RH STG ADHERE TO SAFETY PRECAUTIONS W/ASSISTANCE/DEVICE Description: STG Adhere to Safety Precautions With cues Assistance/Device. Outcome: Progressing   Problem: RH PAIN MANAGEMENT Goal: RH STG PAIN MANAGED AT OR BELOW PT'S PAIN GOAL Description: Pain < 4 with prns Outcome: Progressing   Problem: RH KNOWLEDGE DEFICIT GENERAL Goal: RH STG INCREASE KNOWLEDGE OF SELF CARE AFTER HOSPITALIZATION Description: Patient and S.O. will be able to manage care at discharge using educational resources for medications and dietary modification independently Outcome: Progressing

## 2024-01-01 DIAGNOSIS — I63512 Cerebral infarction due to unspecified occlusion or stenosis of left middle cerebral artery: Secondary | ICD-10-CM | POA: Diagnosis not present

## 2024-01-01 LAB — GLUCOSE, CAPILLARY
Glucose-Capillary: 162 mg/dL — ABNORMAL HIGH (ref 70–99)
Glucose-Capillary: 220 mg/dL — ABNORMAL HIGH (ref 70–99)
Glucose-Capillary: 98 mg/dL (ref 70–99)
Glucose-Capillary: 117 mg/dL — ABNORMAL HIGH (ref 70–99)

## 2024-01-01 LAB — BASIC METABOLIC PANEL WITH GFR
Anion gap: 9 (ref 5–15)
BUN: 26 mg/dL — ABNORMAL HIGH (ref 8–23)
CO2: 24 mmol/L (ref 22–32)
Calcium: 9 mg/dL (ref 8.9–10.3)
Chloride: 106 mmol/L (ref 98–111)
Creatinine, Ser: 1.32 mg/dL — ABNORMAL HIGH (ref 0.44–1.00)
GFR, Estimated: 45 mL/min — ABNORMAL LOW (ref >60)
Glucose, Bld: 169 mg/dL — ABNORMAL HIGH (ref 70–99)
Potassium: 4.2 mmol/L (ref 3.5–5.1)
Sodium: 139 mmol/L (ref 135–145)

## 2024-01-01 MED ORDER — AMLODIPINE BESYLATE 5 MG PO TABS
5.0000 mg | ORAL_TABLET | Freq: Every day | ORAL | Status: DC
  Administered 2024-01-01: 5 mg via ORAL
  Filled 2024-01-01 (×2): qty 1

## 2024-01-01 NOTE — Progress Notes (Signed)
 Speech Language Pathology Daily Session Note  Patient Details  Name: Haley Jones MRN: [id number] Date of Birth: May 26, 1956  Today's Date: 01/01/2024 SLP Individual Time: 0900-1000 SLP Individual Time Calculation (min): 60 min  Short Term Goals: Week 1: SLP Short Term Goal 1 (Week 1): Patient will demonstrate problem solving in mildly complex situations given min multimodal A SLP Short Term Goal 2 (Week 1): Patient will sustain attention to task for 20 minutes given min assist with limited tangential interruptions. SLP Short Term Goal 3 (Week 1): Patient will utilize external and internal memory strategies to recall new daily information with 80% accuracy given min assist. SLP Short Term Goal 4 (Week 1): Patient will demonstrate intellectual awareness of deficits by stating 2 cognitive deficits given mod verbal cue  Skilled Therapeutic Interventions:   Pt greeted at bedside. She was awake/alert and pleasant throughout tx tasks targeting cognition and language. She required s cues for orientation to date and was independently oriented to reason/place. She appeared to present w/ verbal organization and sustained attention deficits during initial conversation re recent events and timeline of recent medical hx. Due to this, SLP facilitated diagnostic tx tasks targeting verbal expression. She required only s cues for word finding during mildly specific responsive naming task (food/drink), but required modA cues for word finding during task ID and adding one additional item to a category. Despite requiring modA for word finding and processing, she required errorless learning for awareness. She stated "I know there's been changes, but right now I just can't think of them." Of note, SLP was seated on the pt's R side and R inattention was noted. She benefited from minA to utilize a head turn and compensate for visual deficits throughout. At the end of tx tasks, she was left in her chair with the alarm  set and call light within reach. Recommend cont ST. Will continue to provide diagnostic tx tasks targeting language in upcoming tx sessions and adjust POC as needed.    Pain  No pain reported  Therapy/Group: Individual Therapy  [name] 01/01/2024, 9:23 AM

## 2024-01-01 NOTE — Plan of Care (Signed)
  Problem: Consults Goal: RH STROKE PATIENT EDUCATION Description: See Patient Education module for education specifics  Outcome: Progressing   Problem: RH BLADDER ELIMINATION Goal: RH STG MANAGE BLADDER WITH ASSISTANCE Description: STG Manage Bladder With supervision Assistance Outcome: Progressing

## 2024-01-01 NOTE — Patient Care Conference (Signed)
 Inpatient RehabilitationTeam Conference and Plan of Care Update Date: 01/01/2024   Time: 1015 am     Patient Name: Haley Jones      Medical Record Number: [id number]  Date of Birth: Sep 05, 1955 Sex: Female         Room/Bed: 4W19C/4W19C-01 Payor Info: Payor: [employer] MEDICARE / Plan: [employer] MEDICARE CHOICE PPO / Product Type: *No Product type* /    Admit Date/Time:  12/28/2023  3:16 PM  Primary Diagnosis:  Left middle cerebral artery stroke [facility])  Hospital Problems: Principal Problem:   Left middle cerebral artery stroke [facility])    Expected Discharge Date: Expected Discharge Date: 01/11/24  Team Members Present: Physician leading conference: Dr. 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 Transfers at min A level. RUE neglect/vision cut, Mild extensor tone. Barriers include: resistance to having help at home.   Supervision to mod I   RUE NMR, ADLs, transfers, balance    Mobility   minA bed mobility, modA transfers and ambulation x100' with RW   mod(I) transfers, supervision ambulation  Rt sided attention, balance, transfers, ambulation, safety awareness    Communication   hoarse vocal quality at baseline            Safety/Cognition/ Behavioral Observations  mild to mod cognitive deficits in attention, memory, problem solving, awareness   sup to min A   implement compensatory strategies, atttention, and awareness    Pain   No c/o pain at this time. (requested tylenol  at hs as  per pt "helps me sleep better")   Pain <3/10   assess qshift and prn    Skin   Skin intact   maintain skin integrity  assess qshift and prn      Discharge Planning:  Plan to d/c home alone; ramped entry, has hospital bed/air mattress from dtr.  step in shower/handicapped accessible bathroom. Sister [name] able to provide supervison as needed.    Team Discussion: Patient was admitted post left MCA infarction/occlusion. Patient with elevated creatinine levels/high blood pressure/pain/ hyperglycemia  : medications adjusted by MD. 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   Patient on target to meet rehab goals: yes, Patient requires minimal assistance with ADLs. Patient requires minimal- mod  assistance with transfer. Patient is able to ambulate 100' using a rolling walker with moderate assistance. Overall goals at discharge are set for supervision-mod I.   *See Care Plan and progress notes for long and short-term goals.   Revisions to Treatment Plan:  Neuropsych consult   Teaching Needs: Safety, medications, dietary modifications, toileting, diabetic teaching, transfers, etc.   Current Barriers to Discharge: Decreased caregiver support and Incontinence  Possible Resolutions to Barriers: Family Education DME: ramp/hospital bed     Medical Summary Current Status: medically complicated by CVA, hyperglycemia, AKI, hypertension, constipation, and behavior/mood  Barriers to Discharge: Behavior/Mood;Complicated Wound;Medical stability;Self-care education;Uncontrolled  Hypertension;Uncontrolled Diabetes;Renal Insufficiency/Failure;Uncontrolled Pain   Possible Resolutions to [employer]: titrate pain medications, monitor labs and vitals, encourage PO fluids and adjust meds for AKI   Continued Need for Acute Rehabilitation Level of Care: The patient requires daily medical management by a  physician with specialized training in physical medicine and rehabilitation for the following reasons: Direction of a multidisciplinary physical rehabilitation program to maximize functional independence : Yes Medical management of patient stability for increased activity during participation in an intensive rehabilitation regime.: Yes Analysis of laboratory values and/or radiology reports with any subsequent need for medication adjustment and/or medical intervention. : Yes   I attest that I was present, lead the team conference, and concur with the assessment and plan of the team.   Jerene Monks 01/01/2024, 1015 am

## 2024-01-01 NOTE — Progress Notes (Signed)
 Patient ID: Haley Jones, female   DOB: [birth year], 68 y.o.   MRN: [id number] Met with the patient and spoke with brother via phone to review team conference report. Reviewed recommending d/c date of 01/01/24 with goals for supervision - mod I overall. Patient plans to return to her home with sister assisting; working on additional assistance from friend.  Patient and brother acknowledged an understanding of the information reviewed and are in agreement with the discharge date and plan. DME and follow up pending. Naoma Bacca

## 2024-01-01 NOTE — Progress Notes (Signed)
 Occupational Therapy Session Note  Patient Details  Name: Haley Jones MRN: [id number] Date of Birth: 1956/01/04  Today's Date: 01/01/2024 OT Individual Time: 4098-1191 OT Individual Time Calculation (min): 71 min    Short Term Goals: Week 1:  OT Short Term Goal 1 (Week 1): Patient will complete sit to stand using RW with close S OT Short Term Goal 2 (Week 1): The pt will complete UB/LB dressing with s/u A for UB and MinA for LB using the hemi technique OT Short Term Goal 3 (Week 1): The pt will demonstrate increase attention to the Rt Side of the body 80% of the time following intial cues and demostration. OT Short Term Goal 4 (Week 1): The pt will improve bilateral hand coordination by incorporating the RUE as a prime mover 80% of the time OT Short Term Goal 5 (Week 1): The pt wil partipate in cognitive activities with 80% accuracy involving  memory and with 1-5 steps iinstuction.  Skilled Therapeutic Interventions/Progress Updates:    Pt received supine with no c/o pain, agreeable to OT session starting with shower. Focus of ADL retraining and cueing on R inattention, inclusion of the RUE and balance during standing level bathing. She required min A to stand from EOB with the RW. She completed functional mobility into the bathroom with min A, requiring cueing for RW management d/t R inattention. She was able to maintain R grasp the entire time on the RW but frequently had the RW sideways d/t R/L imbalance. She transferred into the shower with min A, mod cueing for sequencing. She completed UB bathing with good spontaneous inclusion of the RUE in bathing tasks but frequently the R hand would grab a hold of a washcloth or the grab bar without her realizing. She demonstrated poor awareness/insight into deficits stating "I never used to have trouble with this" multiple times. She is really not wanting her sister to provide supervision at home but OT provided reinforcement of this need  multiple times throughout session by bringing attention to safety concerns as they arose in re to R inattention. She completed LB bathing in standing with min A. She returned to the w/c following with min A. She donned a shirt with evident dressing apraxia and R inattention, mod A to don. She donned pants with similar struggles requiring mod A to correctly orient and motor plan putting each leg into a separate hole. She was then taken to the therapy gym for brief visual scanning/R attention and RUE NMR task of reaching for and matching cards in the R quadrant. She required mod cueing for R attention and demonstrated only 40 degrees of shoulder flexion and apraxia in functional reach. She returned to her room following. Pt was left sitting up in the wheelchair with all needs met, chair alarm set, and call bell within reach.   Therapy Documentation Precautions:  Precautions Precautions: Fall Recall of Precautions/Restrictions: Impaired Restrictions Weight Bearing Restrictions Per Provider Order: No  Therapy/Group: Individual Therapy  [name] 01/01/2024, 6:39 AM

## 2024-01-01 NOTE — Progress Notes (Signed)
 PROGRESS NOTE   Subjective/Complaints: No acute complaints.  No events overnight. Mildly elevated blood pressure, otherwise vitals are stable. Blood sugars slightly elevated but consistently less than 200. BUN/creatinine elevated.  Otherwise, BMP and CBC stable. Does complain of worsening pain at nighttime, does not want pain medication escalated at this time  Discussed increasing p.o. fluid intake given ongoing elevated creatinine, patient states she mostly drinks sugar-free sodas but is agreeable to switching to water.  ROS: Patient denies fever, new vision changes, dizziness, nausea, vomiting, diarrhea,  shortness of breath or chest pain, or mood change. + Headache -improved  Objective:   No results found. Recent Labs    12/31/23 0622  WBC 4.6  HGB 11.9*  HCT 36.0  PLT 203   Recent Labs    12/31/23 0622 01/01/24 0557  NA 138 139  K 4.1 4.2  CL 103 106  CO2 26 24  GLUCOSE 150* 169*  BUN 26* 26*  CREATININE 1.39* 1.32*  CALCIUM  8.9 9.0    Intake/Output Summary (Last 24 hours) at 01/01/2024 1016 Last data filed at 01/01/2024 1308 Gross per 24 hour  Intake 832 ml  Output --  Net 832 ml        Physical Exam: Vital Signs Blood pressure (!) 163/99, pulse 84, temperature 98.4 F (36.9 C), temperature source Oral, resp. rate 17, height 4\' 9"  (1.448 m), weight 70.7 kg, SpO2 99%.   General: No apparent distress, sitting upright in bedside chair. HEENT: Head is normocephalic, atraumatic, MMM Neck: Supple without JVD or lymphadenopathy Heart: Reg rate and rhythm. No murmurs rubs or gallops Chest: CTAB, nonlabored breathing Abdomen: Soft, non-tender, non-distended, bowel sounds positive. Extremities: No clubbing, cyanosis, or edema. Pulses are 2+ Psych: Pt's affect is appropriate. Pt is cooperative Skin: Bruises on her arms noted--healing Neuro:    Mental Status: AAOx4,  Right sided neglect   Provides name  and age but does display some decrease in attention and delay in processing.   Speech/Languate: Naming and repetition intact, fluent, follows simple commands. Mild dysarthria, no aphasia noted CRANIAL NERVES: II: PERRL.  Right homonymous hemianopsia III, IV, VI: EOM intact, right neglect V: normal sensation bilaterally VII: Right facial weakness VIII: normal hearing to speech IX, X: normal palatal elevation XI: Shoulder shrug intact XII: Tongue midline     MOTOR: RUE: 4-/5 Deltoid, 4/5 Biceps, 4/5 Triceps,4/5 Grip LUE: 5/5 Deltoid, 5/5 Biceps, 5/5 Triceps, 5/5 Grip RLE: HF 4/5, KE 4+/5, ADF 4+/5, APF 4+/5 LLE: HF 5/5, KE 5/5, ADF 5/5, APF 5/5   Unchanged exam 5/20   SENSORY: Normal to touch all 4 extremities   Coordination: Finger-nose altered on the right slightly   MSK: No joint swelling noted Full passive range of motion all 4 extremities  Assessment/Plan: 1. Functional deficits which require 3+ hours per day of interdisciplinary therapy in a comprehensive inpatient rehab setting. Physiatrist is providing close team supervision and 24 hour management of active medical problems listed below. Physiatrist and rehab team continue to assess barriers to discharge/monitor patient progress toward functional and medical goals  Care Tool:  Bathing    Body parts bathed by patient: Right arm, Left arm, Chest, Abdomen, Front perineal  area, Buttocks, Right upper leg, Left upper leg, Right lower leg, Left lower leg, Face         Bathing assist Assist Level: Supervision/Verbal cueing     Upper Body Dressing/Undressing Upper body dressing   What is the patient wearing?: Pull over shirt    Upper body assist Assist Level: Minimal Assistance - Patient > 75%    Lower Body Dressing/Undressing Lower body dressing      What is the patient wearing?: Incontinence brief, Pants     Lower body assist Assist for lower body dressing: Moderate Assistance - Patient 50 - 74%      Toileting Toileting    Toileting assist Assist for toileting: Supervision/Verbal cueing     Transfers Chair/bed transfer  Transfers assist  Chair/bed transfer activity did not occur: Safety/medical concerns  Chair/bed transfer assist level: Moderate Assistance - Patient 50 - 74%     Locomotion Ambulation   Ambulation assist      Assist level: Moderate Assistance - Patient 50 - 74% Assistive device: Walker-rolling Max distance: 150   Walk 10 feet activity   Assist     Assist level: Minimal Assistance - Patient > 75% Assistive device: Walker-rolling   Walk 50 feet activity   Assist    Assist level: Moderate Assistance - Patient - 50 - 74% Assistive device: Walker-rolling    Walk 150 feet activity   Assist    Assist level: Moderate Assistance - Patient - 50 - 74% Assistive device: Walker-rolling    Walk 10 feet on uneven surface  activity   Assist     Assist level: Moderate Assistance - Patient - 50 - 74% Assistive device: Photographer Is the patient using a wheelchair?: No             Wheelchair 50 feet with 2 turns activity    Assist            Wheelchair 150 feet activity     Assist          Blood pressure (!) 163/99, pulse 84, temperature 98.4 F (36.9 C), temperature source Oral, resp. rate 17, height 4\' 9"  (1.448 m), weight 70.7 kg, SpO2 99%.  Medical Problem List and Plan: 1. 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 Motor planning difficulty with RUE per therapies. Limited by inattention. Mod A for transfers and ambulation with a walker. Mod A cognition/attention. Insight poor - has trouble being open to assitance.   2.  Antithrombotics: -DVT/anticoagulation:  Pharmaceutical: Lovenox               -antiplatelet therapy: Aspirin  81 mg daily and Plavix  75 mg daily x 3 months then Plavix  alone  3. Pain Management: Tylenol  as needed  - 5/17 occasional headache control with Tylenol , continue to monitor  5/19: Pain worse nightly, however patient wishes to continue with repositioning and as needed medications.  No additions at this time.  4. Mood/Behavior/Sleep: Effexor  37.5 mg daily, Klonopin  1 mg nightly             -antipsychotic agents: N/A             -  Pt reports daughter passed away around the time she was admitted, consider neuropsych consult             -She is not interested in any adjustment to antidepressant medications at this time  5. Neuropsych/cognition: This patient is capable of making decisions on her own behalf.   - 5/20: Would benefit from neuropsych consult; recent death of her daughter   18. Skin/Wound Care: Routine skin checks 7. Fluids/Electrolytes/Nutrition: Routine in and outs with follow-up chemistries 8.  Diabetes mellitus.  Hemoglobin A1c 8.4.  Presently on NovoLog  5 units 3 times daily, Semglee  10 units daily.  Check blood sugars AC and at bedtime.  Diabetic teaching.  Prior to admission patient on Trulicity  0.75 mg weekly, Amaryl  2 mg daily, Glucophage 500 mg in the morning and 1000 mg in the evening, Glucotrol XL 10 mg daily.  Monitor with increased activity CBG (last 3)  Recent Labs    12/31/23 1642 12/31/23 2108 01/01/24 0617  GLUCAP 122* 125* 162*  5/18-19 fair control continue current regimen and monitor trend  9.  AKI on CKD stage III.  Follow-up chemistries -5-19: BUN/creatinine elevated from 17/1 0.19-26/1.39.  P.o. intake seem adequate, continue to encourage and will give 500 cc bolus today.  Repeat labs in AM.  No obvious medication contributions but may need to just adjust venlafaxine  if worsening. 5/20: BUN/creatinine stable.  Discussed with patient, she states she has mostly been drinking sugar-free sodas.  Agreeable to switching to water and  drinking 68 to 50 cc cups today .  Repeat labs in AM.  10.  Permissive hypertension.  Monitor with increased mobility.  Resume home Norvasc  5 mg daily, lisinopril  10 mg daily as needed    01/01/2024    5:00 AM 12/31/2023    8:32 PM 12/31/2023    2:35 PM  Vitals with BMI  Systolic 163 147 865  Diastolic 99 74 87  Pulse 84 86 86  5/18 BP intermittently elevated, if continues consider increase Norvasc .  For now we will continue to monitor as do not want to cause hypotension/hypoperfusion 5-19: BP currently in good range, monitor 5/20: BP elevated overnight, resume norvasc  5 mg daily (no BP meds ordered?)  11.  Hyperlipidemia.  Crestor  12.  GERD.  Protonix  13.  B12 deficiency             - Outpatient B12 injection.  Try to determine when she had this last 14. Chronic constipation             -Reports BM yesterday             -Miralax  and senokot   -LBM 5/16, patient reports she does not feel constipated and does not have bowel movement every day.  Continue current regimen for now and monitor   5-20: Last bowel movement 5-16, will schedule Senokot S 2 tabs twice daily.    LOS: 4 days A FACE TO FACE EVALUATION WAS PERFORMED  Bea Lime 01/01/2024, 10:16 AM

## 2024-01-01 NOTE — Progress Notes (Signed)
 Physical Therapy Session Note  Patient Details  Name: Haley Jones MRN: [id number] Date of Birth: 02-05-1956  Today's Date: 01/01/2024 PT Individual Time: 1035-1100 PT Individual Time Calculation (min): 25 min   Today's Date: 01/01/2024 PT Individual Time: 7829-5621 PT Individual Time Calculation (min): 27 min   Short Term Goals: Week 1:  PT Short Term Goal 1 (Week 1): STG=LTG due to ELOS  Skilled Therapeutic Interventions/Progress Updates:     1st Session: Pt received seated in [place] and agrees to therapy. No complaint of pain. WC transport to gym for time management. Pt ambulates x100' with RW and minA, with cues for for Rt sided attention and safe RW management. Pt takes seated rest break. Pt then attempts ambulation in obstacle course, tasked with weaving in and out of cones. Pt routinely knocks cones over on Rt side, and also has difficulty sequencing 180 degree to the Rt. Pt provides minA and mod verbal cues throughout to draw attention to the Rt, as well as increased cues for sequencing of turn to sit in WC. WC transport back to room. Left seated with alarm intact and all needs within reach.   2nd Session: Pt received seated in [facility] and agrees to therapy. No complaint of pain. WC transport to gym for time management. Pt performs standing activities in parallel bars to work on balance and Rt sided attention. Pt performs sidestepping in both directions holding onto bar with BUEs and cued to use mirror for visual feedback. PT provides consistent cueing to attend to RUE as well as cues for optimal sequencing and mechanics. Pt routinely forgets to move RUE and appears to be unaware of much of Rt visual field. Pt performs x30' in each direction with seated rest breaks. Pt then completes alternating foot taps on cones with BUE support and PT providing cues for attention to RLE and ensuring pt lifts Rt foot adequately. WC transport back to room. Pt requires modA for transfer back to bed due to  lunging for edge of bed rather than turning to sit for transfer. Pt left supine in bed with alarm intact and all needs within reach.   Therapy Documentation Precautions:  Precautions Precautions: Fall, Other (comment) (R neglect and R visual field cut) Recall of Precautions/Restrictions: Impaired Restrictions Weight Bearing Restrictions Per Provider Order: No  Therapy/Group: Individual Therapy  Neva Barban, PT, DPT 01/01/2024, 4:36 PM

## 2024-01-02 DIAGNOSIS — I63512 Cerebral infarction due to unspecified occlusion or stenosis of left middle cerebral artery: Secondary | ICD-10-CM | POA: Diagnosis not present

## 2024-01-02 LAB — GLUCOSE, CAPILLARY
Glucose-Capillary: 191 mg/dL — ABNORMAL HIGH (ref 70–99)
Glucose-Capillary: 91 mg/dL (ref 70–99)
Glucose-Capillary: 193 mg/dL — ABNORMAL HIGH (ref 70–99)
Glucose-Capillary: 87 mg/dL (ref 70–99)

## 2024-01-02 MED ORDER — HYDRALAZINE HCL 25 MG PO TABS: 25.0000 mg | ORAL_TABLET | Freq: Three times a day (TID) | ORAL | Status: DC | PRN

## 2024-01-02 MED ORDER — AMLODIPINE BESYLATE 10 MG PO TABS
10.0000 mg | ORAL_TABLET | Freq: Every day | ORAL | Status: DC
  Administered 2024-01-03 – 2024-01-18 (×15): 10 mg via ORAL
  Filled 2024-01-02 (×14): qty 1
  Filled 2024-01-02: qty 2
  Filled 2024-01-02: qty 1

## 2024-01-02 MED ORDER — SENNOSIDES-DOCUSATE SODIUM 8.6-50 MG PO TABS
2.0000 | ORAL_TABLET | Freq: Two times a day (BID) | ORAL | Status: DC
  Administered 2024-01-02 – 2024-01-18 (×28): 2 via ORAL
  Filled 2024-01-02 (×31): qty 2

## 2024-01-02 MED ORDER — BACLOFEN 5 MG HALF TABLET
5.0000 mg | ORAL_TABLET | Freq: Two times a day (BID) | ORAL | Status: DC
  Administered 2024-01-02 – 2024-01-07 (×11): 5 mg via ORAL
  Filled 2024-01-02 (×11): qty 1

## 2024-01-02 NOTE — Progress Notes (Addendum)
 Speech Language Pathology Daily Session Note  Patient Details  Name: Haley Jones MRN: [id number] Date of Birth: 04-05-1956  Today's Date: 01/02/2024 SLP Individual Time: 0930-1030 SLP Individual Time Calculation (min): 60 min  Short Term Goals: Week 1: SLP Short Term Goal 1 (Week 1): Patient will demonstrate problem solving in mildly complex situations given min multimodal A SLP Short Term Goal 2 (Week 1): Patient will sustain attention to task for 20 minutes given min assist with limited tangential interruptions. SLP Short Term Goal 3 (Week 1): Patient will utilize external and internal memory strategies to recall new daily information with 80% accuracy given min assist. SLP Short Term Goal 4 (Week 1): Patient will demonstrate intellectual awareness of deficits by stating 2 cognitive deficits given mod verbal cue  Skilled Therapeutic Interventions:   Pt received at bedside. She was awake/alert in her wheelchair upon SLP arrival - pleasant  and cooperative throughout tx tasks targeting cognition and verbal expression. During initial conversation, she recalled recent events with only s cues. She then completed mildly complex written money management task and benefited from minA cues for caclulations and modA for visual organization and R visual attention throughout task. She required maxA for information processing and attention to detail during written organization task. She also completed a specific word finding task Present[employer], broadcasting) w/ ModA for naming. She continues to demonstrate no insight into deficits, but is seemingly receptive to error corrections and education re awareness. At the end of tx tasks, she was left in the chair with the alarm set and call light within reach.  LTG targeting verbal expression was added given reduced success w/ naming.    Pain  Pt reported 3/10 R wrist/hand pain. Already received pain medication - see EMR for more info  Therapy/Group: Individual  Therapy  [name] 01/02/2024, 9:50 AM

## 2024-01-02 NOTE — Progress Notes (Signed)
 Occupational Therapy Session Note  Patient Details  Name: Haley Jones MRN: [id number] Date of Birth: 04/13/1956  Today's Date: 01/02/2024 OT Individual Time: 1105-1205 OT Individual Time Calculation (min): 60 min    Short Term Goals: Week 1:  OT Short Term Goal 1 (Week 1): Patient will complete sit to stand using RW with close S OT Short Term Goal 2 (Week 1): The pt will complete UB/LB dressing with s/u A for UB and MinA for LB using the hemi technique OT Short Term Goal 3 (Week 1): The pt will demonstrate increase attention to the Rt Side of the body 80% of the time following intial cues and demostration. OT Short Term Goal 4 (Week 1): The pt will improve bilateral hand coordination by incorporating the RUE as a prime mover 80% of the time OT Short Term Goal 5 (Week 1): The pt wil partipate in cognitive activities with 80% accuracy involving  memory and with 1-5 steps iinstuction.  Skilled Therapeutic Interventions/Progress Updates:    Pt received sitting in the wc with no c/o pain, agreeable to OT session. Pt was taken via w/c to the ADL apt for time management. She worked on [employer]- reaching into cabinets with her RUE to address functional reaching/flexion of the shoulder and on R attention/scanning. She required mod facilitation at the elbow to bring shoulder to 90 degrees of flexion (achieving about 50 degrees herself). She required significant cueing/facilitation to reduce compensation at the upper traps and trunk. She then stood and with the RUE completed resistive flexion/extension by pushing/pulling a vacuum cleaner for a full 90 seconds to maximize NMR. She required OT facilitation for stability at the wrist into extension and shoulder extension. 2 repetitions with extended rest break between d/t pt reports of fatigue. She then completed more complex BUE integration task- folding and hanging towels on hangers in standing. She required min A for standing balance support. Mod  cueing for RUE sequencing and motor planning with R inattention/apraxia making task very difficult for her. She was very frustrated but OT provided encouragement that this was an excellent challenge. She returned to her room following. Pt was left sitting up in the wheelchair with all needs met, chair alarm set, and call bell within reach.    Therapy Documentation Precautions:  Precautions Precautions: Fall, Other (comment) (R neglect and R visual field cut) Recall of Precautions/Restrictions: Impaired Restrictions Weight Bearing Restrictions Per Provider Order: No  Therapy/Group: Individual Therapy  [name] 01/02/2024, 11:22 AM

## 2024-01-02 NOTE — Progress Notes (Signed)
 PROGRESS NOTE   Subjective/Complaints: No events overnight. Patient with mildly elevated blood pressure overnight, asymptomatic. She is complaining of some swelling and tightness in her right hand  ROS: Patient denies fever, new vision changes, dizziness, nausea, vomiting, diarrhea,  shortness of breath or chest pain, or mood change. + Headache -improved + Right hand tightness  Objective:   No results found. Recent Labs    12/31/23 0622  WBC 4.6  HGB 11.9*  HCT 36.0  PLT 203   Recent Labs    12/31/23 0622 01/01/24 0557  NA 138 139  K 4.1 4.2  CL 103 106  CO2 26 24  GLUCOSE 150* 169*  BUN 26* 26*  CREATININE 1.39* 1.32*  CALCIUM  8.9 9.0   No intake or output data in the 24 hours ending 01/02/24 2335       Physical Exam: Vital Signs Blood pressure 125/87, pulse 87, temperature 98.7 F (37.1 C), temperature source Oral, resp. rate 17, height 4\' 9"  (1.448 m), weight 70.7 kg, SpO2 98%.   General: No apparent distress, sitting up in bedside chair eating breakfast. HEENT: Head is normocephalic, atraumatic, MMM Neck: Supple without JVD or lymphadenopathy Heart: Reg rate and rhythm. No murmurs rubs or gallops Chest: CTAB, nonlabored breathing Abdomen: Soft, non-tender, non-distended, bowel sounds positive. Extremities: No clubbing, cyanosis, or edema. Pulses are 2+ Psych: Pt's affect is appropriate. Pt is cooperative Skin: No apparent skin breakdown, no apparent lesions. Neuro:    Mental Status: AAOx4,  Right sided neglect   Some decreased attention and delay in processing, mild. Speech/Languate: Naming and repetition intact, fluent, follows simple commands., no aphasia noted CRANIAL NERVES: Right facial weakness, mild right homonymous hemianopsia, otherwise intact.     MOTOR: RUE: 4-/5 Deltoid, 4/5 Biceps, 4/5 Triceps,4/5 Grip LUE: 5/5 Deltoid, 5/5 Biceps, 5/5 Triceps, 5/5 Grip RLE: HF 4/5, KE 4+/5,  ADF 4+/5, APF 4+/5 LLE: HF 5/5, KE 5/5, ADF 5/5, APF 5/5   Tone: MAS 1 right finger flexors, elbow flexor.  Otherwise, MAS 0   SENSORY: Normal to touch all 4 extremities   Coordination: Finger-nose altered on the right slightly   MSK: No joint swelling noted Full passive range of motion all 4 extremities  Assessment/Plan: 1. Functional deficits which require 3+ hours per day of interdisciplinary therapy in a comprehensive inpatient rehab setting. Physiatrist is providing close team supervision and 24 hour management of active medical problems listed below. Physiatrist and rehab team continue to assess barriers to discharge/monitor patient progress toward functional and medical goals  Care Tool:  Bathing    Body parts bathed by patient: Right arm, Left arm, Chest, Abdomen, Front perineal area, Buttocks, Right upper leg, Left upper leg, Right lower leg, Left lower leg, Face         Bathing assist Assist Level: Moderate Assistance - Patient 50 - 74%     Upper Body Dressing/Undressing Upper body dressing   What is the patient wearing?: Pull over shirt    Upper body assist Assist Level: Moderate Assistance - Patient 50 - 74%    Lower Body Dressing/Undressing Lower body dressing      What is the patient wearing?: Incontinence brief, Pants  Lower body assist Assist for lower body dressing: Moderate Assistance - Patient 50 - 74%     Toileting Toileting    Toileting assist Assist for toileting: Moderate Assistance - Patient 50 - 74%     Transfers Chair/bed transfer  Transfers assist  Chair/bed transfer activity did not occur: Safety/medical concerns  Chair/bed transfer assist level: Moderate Assistance - Patient 50 - 74%     Locomotion Ambulation   Ambulation assist      Assist level: Minimal Assistance - Patient > 75% Assistive device: Walker-rolling Max distance: 175'   Walk 10 feet activity   Assist     Assist level: Minimal Assistance -  Patient > 75% Assistive device: Walker-rolling   Walk 50 feet activity   Assist    Assist level: Minimal Assistance - Patient > 75% Assistive device: Walker-rolling    Walk 150 feet activity   Assist    Assist level: Minimal Assistance - Patient > 75% Assistive device: Walker-rolling    Walk 10 feet on uneven surface  activity   Assist     Assist level: Moderate Assistance - Patient - 50 - 74% Assistive device: Walker-rolling   Wheelchair     Assist Is the patient using a wheelchair?: No             Wheelchair 50 feet with 2 turns activity    Assist            Wheelchair 150 feet activity     Assist          Blood pressure 125/87, pulse 87, temperature 98.7 F (37.1 C), temperature source Oral, resp. rate 17, height 4\' 9"  (1.448 m), weight 70.7 kg, SpO2 98%.  Medical Problem List and Plan: 1. 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 Motor planning difficulty with RUE per therapies. Limited by inattention. Mod A for transfers and ambulation with a walker. Mod A cognition/attention. Insight poor - has trouble being open to assitance.   2.  Antithrombotics: -DVT/anticoagulation:  Pharmaceutical: Lovenox              -antiplatelet therapy: Aspirin  81 mg daily and Plavix  75 mg daily x 3 months then Plavix  alone  3. Pain Management: Tylenol  as needed  - 5/17 occasional headache control with Tylenol , continue to monitor  5/19: Pain worse nightly, however patient wishes to continue with repositioning and as needed medications.  No additions at this time.  4. Mood/Behavior/Sleep: Effexor  37.5 mg daily, Klonopin  1 mg nightly             -antipsychotic agents: N/A             -Pt reports daughter passed away around the time she was admitted, consider neuropsych  consult             -She is not interested in any adjustment to antidepressant medications at this time  5. Neuropsych/cognition: This patient is capable of making decisions on her own behalf.   - 5/20: Would benefit from neuropsych consult; recent death of her daughter   72. Skin/Wound Care: Routine skin checks 7. Fluids/Electrolytes/Nutrition: Routine in and outs with follow-up chemistries 8.  Diabetes mellitus.  Hemoglobin A1c 8.4.  Presently on NovoLog  5 units 3 times daily, Semglee  10 units daily.  Check blood sugars AC and at bedtime.  Diabetic teaching.  Prior to admission patient on Trulicity  0.75 mg weekly, Amaryl  2 mg daily, Glucophage 500 mg in the morning and 1000 mg in the evening, Glucotrol XL 10 mg daily.  Monitor with increased activity CBG (last 3)  Recent Labs    01/02/24 1210 01/02/24 1709 01/02/24 2109  GLUCAP 91 193* 87  5/18-19 fair control continue current regimen and monitor trend  9.  AKI on CKD stage III.  Follow-up chemistries -5-19: BUN/creatinine elevated from 17/1 0.19-26/1.39.  P.o. intake seem adequate, continue to encourage and will give 500 cc bolus today.  Repeat labs in AM.  No obvious medication contributions but may need to just adjust venlafaxine  if worsening. 5/20: BUN/creatinine stable.  Discussed with patient, she states she has mostly been drinking sugar-free sodas.  Agreeable to switching to water and drinking 68 to 50 cc cups today .  Repeat labs 5-2 2 AM  10.  Permissive hypertension.  Monitor with increased mobility.  Resume home Norvasc  5 mg daily, lisinopril  10 mg daily as needed    01/02/2024    7:36 PM 01/02/2024    1:05 PM 01/02/2024    3:54 AM  Vitals with BMI  Systolic 125 136 161  Diastolic 87 78 86  Pulse 87 92 76  5/18 BP intermittently elevated, if continues consider increase Norvasc .  For now we will continue to monitor as do not want to cause hypotension/hypoperfusion 5-19: BP currently in good range, monitor 5/20: BP elevated  overnight, resume norvasc  5 mg daily (no BP meds ordered?) 5/21: Increase Norvasc  to 10 mg daily.  Add as needed hydralazine  25 mg every 8 hours as needed for elevated systolic and diastolic blood pressures.  Give a few days for Norvasc  to work.  11.  Hyperlipidemia.  Crestor  12.  GERD.  Protonix  13.  B12 deficiency             - Outpatient B12 injection.  Try to determine when she had this last 14. Chronic constipation             -Reports BM yesterday             -Miralax  and senokot   -LBM 5/16, patient reports she does not feel constipated and does not have bowel movement every day.  Continue current regimen for now and monitor   5-20: Last bowel movement 5-16, will schedule Senokot S 2 tabs twice daily.  Last bowel movement 5-21, large, continent    LOS: 5 days A FACE TO FACE EVALUATION WAS PERFORMED  Bea Lime 01/02/2024, 11:35 PM

## 2024-01-02 NOTE — Progress Notes (Signed)
 Physical Therapy Session Note  Patient Details  Name: Haley Jones MRN: [id number] Date of Birth: 05/14/1956  Today's Date: 01/02/2024 PT Individual Time: 7829-5621 PT Individual Time Calculation (min): 70 min   Short Term Goals: Week 1:  PT Short Term Goal 1 (Week 1): STG=LTG due to ELOS  Skilled Therapeutic Interventions/Progress Updates:     Pt received seated in [facility] and agrees to therapy. No complaint of pain. WC transport to gym for time management. Pt performs stand step transfer from [facility] to Nustep with minA and additional cueing for safe RW management and positioning due to pt's Rt sided inattention. Pt completes Nustep to challenge endurance and Rt sided attention as well as reciprocal coordination. Pt completes x15:00 on workload of 4 with average steps per minute ~45. PT provides consistent cueing to complete full available ROM, though pt tends to move through relatively small range. Following rest break, pt ambulates x175' with RW and minA, with PT assisting for RW propulsion to promote improved gait pattern and balance. Pt has difficulty with safe positioning in RW, tending to ambulating on Lt side and with minimal attention to Rt hemibody. Pt requires increased cueing for safe transition back to WC. Following, pt perform activities for NMR for balance Rt hemibody coordination and attention. Pt tasked with removing squigz from mirror with RUE and placing in bucket, requiring cues for accuracy and attention. Mirror moved farther to Rt to promote trunk rotation Rt cervical rotation, then pt completes same activity. MinA provided with pt having decreased safety awareness, especially when attempting to transition back to sitting in WC. Pt then completes multiple bouts of cornhole, standing and reaching for beanbags with LUE, passing them to RUE, then tossing at target. PT provides cues for posture and accurate completion, and provides minA overall for stability. WC transport back to room.  Left seated with alarm intact and all needs within reach.   Therapy Documentation Precautions:  Precautions Precautions: Fall, Other (comment) (R neglect and R visual field cut) Recall of Precautions/Restrictions: Impaired Restrictions Weight Bearing Restrictions Per Provider Order: No   Therapy/Group: Individual Therapy  Neva Barban, PT, DPT 01/02/2024, 3:53 PM

## 2024-01-02 NOTE — Plan of Care (Signed)
  Problem: RH Expression Communication Goal: LTG Patient will verbally express basic/complex needs(SLP) Description: LTG:  Patient will verbally express basic/complex needs, wants or ideas with cues  (SLP) Flowsheets (Taken 01/02/2024 1138) LTG: Patient will verbally express basic/complex needs, wants or ideas (SLP): Minimal Assistance - Patient > 75%

## 2024-01-03 DIAGNOSIS — I63512 Cerebral infarction due to unspecified occlusion or stenosis of left middle cerebral artery: Secondary | ICD-10-CM | POA: Diagnosis not present

## 2024-01-03 LAB — GLUCOSE, CAPILLARY
Glucose-Capillary: 152 mg/dL — ABNORMAL HIGH (ref 70–99)
Glucose-Capillary: 142 mg/dL — ABNORMAL HIGH (ref 70–99)
Glucose-Capillary: 161 mg/dL — ABNORMAL HIGH (ref 70–99)
Glucose-Capillary: 186 mg/dL — ABNORMAL HIGH (ref 70–99)

## 2024-01-03 LAB — BASIC METABOLIC PANEL WITH GFR
Anion gap: 10 (ref 5–15)
BUN: 25 mg/dL — ABNORMAL HIGH (ref 8–23)
CO2: 23 mmol/L (ref 22–32)
Calcium: 9 mg/dL (ref 8.9–10.3)
Chloride: 105 mmol/L (ref 98–111)
Creatinine, Ser: 1.34 mg/dL — ABNORMAL HIGH (ref 0.44–1.00)
GFR, Estimated: 44 mL/min — ABNORMAL LOW (ref >60)
Glucose, Bld: 155 mg/dL — ABNORMAL HIGH (ref 70–99)
Potassium: 4 mmol/L (ref 3.5–5.1)
Sodium: 138 mmol/L (ref 135–145)

## 2024-01-03 NOTE — Progress Notes (Signed)
 Physical Therapy Session Note  Patient Details  Name: Piedad Standiford MRN: [id number] Date of Birth: 12-29-1955  Today's Date: 01/03/2024 PT Individual Time: 1415-1530 PT Individual Time Calculation (min): 75 min   Short Term Goals: Week 1:  PT Short Term Goal 1 (Week 1): STG=LTG due to ELOS  Skilled Therapeutic Interventions/Progress Updates: Pt presents supine in bed and agreeable to therapy.  Pt requires increased cues for sup to sit as just tries to throw herself into sitting.  Pt requires cues for R inattention.  Pt transfers sit to stand w/ min A but cues visual and tactile for hand placement.  Pt required min/mod A for squat pivot to w/c.  Pt wheeled to outside for change in scenery and for NMR on uneven surfaces.  Pt amb w/ RW and min A to bench seat but frequent cues for attention to R as well as maintaining R hand position on RW.  Pt keeps head cocked to Left, requiring cues to accentuate turns to R.  Pt requires occasional A for RW on uneven surfaces.  Pt returned inside and amb x 90' w/ directions given for turns to right.  Pt returned to room and remained sitting in w/c w/ chair alarm on and all needs in reach.     Therapy Documentation Precautions:  Precautions Precautions: Fall, Other (comment) (R neglect and R visual field cut) Recall of Precautions/Restrictions: Impaired Restrictions Weight Bearing Restrictions Per Provider Order: No General:   Vital Signs: Therapy Vitals Temp: 97.7 F (36.5 C) Temp Source: Oral Pulse Rate: 81 Resp: 17 BP: 137/83 Patient Position (if appropriate): Lying Oxygen Therapy SpO2: 98 % O2 Device: Room Air Pain:0/10     Therapy/Group: Individual Therapy  Tashari Schoenfelder P Aizik Reh 01/03/2024, 3:43 PM

## 2024-01-03 NOTE — Progress Notes (Signed)
 PROGRESS NOTE   Subjective/Complaints: 6 out of 10 pain in her right hand overnight.  Patient describes this tightness in her hand, worse first thing in the morning.  She does get in her left hand as well, no prior history of carpal tunnel syndrome. Vitals remained stable. Large bowel movement this a.m., continent. Blood sugars remain well-controlled A.m. labs significant for stable elevated BUN/creatinine.  Decent p.o. intake, about 500 cc yesterday.  ROS: Patient denies fever, new vision changes, dizziness, nausea, vomiting, diarrhea,  shortness of breath or chest pain, or mood change. + Headache -resolved + Right hand tightness-ongoing  Objective:   No results found. No results for input(s): "WBC", "HGB", "HCT", "PLT" in the last 72 hours.  Recent Labs    01/01/24 0557 01/03/24 0618  NA 139 138  K 4.2 4.0  CL 106 105  CO2 24 23  GLUCOSE 169* 155*  BUN 26* 25*  CREATININE 1.32* 1.34*  CALCIUM  9.0 9.0    Intake/Output Summary (Last 24 hours) at 01/03/2024 1031 Last data filed at 01/03/2024 0738 Gross per 24 hour  Intake 460 ml  Output 200 ml  Net 260 ml         Physical Exam: Vital Signs Blood pressure 122/83, pulse 80, temperature 97.9 F (36.6 C), temperature source Oral, resp. rate 17, height 4\' 9"  (1.448 m), weight 70.7 kg, SpO2 99%.   General: No apparent distress, sitting up in bedside chair watching television. HEENT: Head is normocephalic, atraumatic, MMM Neck: Supple without JVD or lymphadenopathy Heart: Reg rate and rhythm. No murmurs rubs or gallops Chest: CTAB, nonlabored breathing Abdomen: Soft, non-tender, non-distended, bowel sounds positive. Extremities: No clubbing, cyanosis, or edema. Pulses are 2+ Psych: Pt's affect is appropriate. Pt is cooperative Skin: No apparent skin breakdown, no apparent lesions. Neuro:    Mental Status: AAOx4,  Right sided neglect   Some decreased attention  and delay in processing, mild. Speech/Languate: Naming and repetition intact, fluent, follows simple commands., no aphasia noted CRANIAL NERVES: Right facial weakness, mild right homonymous hemianopsia, otherwise intact.     MOTOR: RUE: 4-/5 Deltoid, 4/5 Biceps, 4/5 Triceps,4/5 Grip LUE: 5/5 Deltoid, 5/5 Biceps, 5/5 Triceps, 5/5 Grip RLE: HF 4/5, KE 4+/5, ADF 4+/5, APF 4+/5 LLE: HF 5/5, KE 5/5, ADF 5/5, APF 5/5   Tone: MAS 1 right finger flexors, elbow flexor--now trace.  Otherwise, MAS 0   SENSORY: Normal to touch all 4 extremities   Coordination: Finger-nose altered on the right slightly   MSK: No joint swelling noted Full passive range of motion all 4 extremities  Assessment/Plan: 1. Functional deficits which require 3+ hours per day of interdisciplinary therapy in a comprehensive inpatient rehab setting. Physiatrist is providing close team supervision and 24 hour management of active medical problems listed below. Physiatrist and rehab team continue to assess barriers to discharge/monitor patient progress toward functional and medical goals  Care Tool:  Bathing    Body parts bathed by patient: Right arm, Left arm, Chest, Abdomen, Front perineal area, Buttocks, Right upper leg, Left upper leg, Right lower leg, Left lower leg, Face         Bathing assist Assist Level: Moderate Assistance - Patient  50 - 74%     Upper Body Dressing/Undressing Upper body dressing   What is the patient wearing?: Pull over shirt    Upper body assist Assist Level: Moderate Assistance - Patient 50 - 74%    Lower Body Dressing/Undressing Lower body dressing      What is the patient wearing?: Incontinence brief, Pants     Lower body assist Assist for lower body dressing: Moderate Assistance - Patient 50 - 74%     Toileting Toileting    Toileting assist Assist for toileting: Moderate Assistance - Patient 50 - 74%     Transfers Chair/bed transfer  Transfers assist  Chair/bed  transfer activity did not occur: Safety/medical concerns  Chair/bed transfer assist level: Moderate Assistance - Patient 50 - 74%     Locomotion Ambulation   Ambulation assist      Assist level: Minimal Assistance - Patient > 75% Assistive device: Walker-rolling Max distance: 175'   Walk 10 feet activity   Assist     Assist level: Minimal Assistance - Patient > 75% Assistive device: Walker-rolling   Walk 50 feet activity   Assist    Assist level: Minimal Assistance - Patient > 75% Assistive device: Walker-rolling    Walk 150 feet activity   Assist    Assist level: Minimal Assistance - Patient > 75% Assistive device: Walker-rolling    Walk 10 feet on uneven surface  activity   Assist     Assist level: Moderate Assistance - Patient - 50 - 74% Assistive device: Walker-rolling   Wheelchair     Assist Is the patient using a wheelchair?: No             Wheelchair 50 feet with 2 turns activity    Assist            Wheelchair 150 feet activity     Assist          Blood pressure 122/83, pulse 80, temperature 97.9 F (36.6 C), temperature source Oral, resp. rate 17, height 4\' 9"  (1.448 m), weight 70.7 kg, SpO2 99%.  Medical Problem List and Plan: 1. 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 Motor planning difficulty with RUE per therapies. Limited by inattention. Mod A for transfers and ambulation with a walker. Mod A cognition/attention. Insight poor - has trouble being open to assitance.   2.  Antithrombotics: -DVT/anticoagulation:  Pharmaceutical: Lovenox              -antiplatelet therapy: Aspirin  81 mg daily and Plavix  75 mg daily x 3 months then Plavix  alone  3. Pain Management: Tylenol  as needed  - 5/17 occasional headache control with  Tylenol , continue to monitor  5/19: Pain worse nightly, however patient wishes to continue with repositioning and as needed medications.  No additions at this time.  5-22: ?  Possible underlying carpal tunnel syndrome affecting bilateral hand soreness in the morning--may benefit from outpatient EMG  4. Mood/Behavior/Sleep: Effexor  37.5 mg daily, Klonopin  1 mg nightly             -antipsychotic agents: N/A             -Pt reports daughter passed  away around the time she was admitted, consider neuropsych consult             -She is not interested in any adjustment to antidepressant medications at this time  5. Neuropsych/cognition: This patient is capable of making decisions on her own behalf.   - 5/20: Would benefit from neuropsych consult; recent death of her daughter   71. Skin/Wound Care: Routine skin checks 7. Fluids/Electrolytes/Nutrition: Routine in and outs with follow-up chemistries 8.  Diabetes mellitus.  Hemoglobin A1c 8.4.  Presently on NovoLog  5 units 3 times daily, Semglee  10 units daily.  Check blood sugars AC and at bedtime.  Diabetic teaching.  Prior to admission patient on Trulicity  0.75 mg weekly, Amaryl  2 mg daily, Glucophage 500 mg in the morning and 1000 mg in the evening, Glucotrol XL 10 mg daily.  Monitor with increased activity CBG (last 3)  Recent Labs    01/02/24 1709 01/02/24 2109 01/03/24 0605  GLUCAP 193* 87 152*  Blood sugars well-controlled on current regimen  9.  AKI on CKD stage III.  Follow-up chemistries -5-19: BUN/creatinine elevated from 17/1 0.19-26/1.39.  P.o. intake seem adequate, continue to encourage and will give 500 cc bolus today.  Repeat labs in AM.  No obvious medication contributions but may need to just adjust venlafaxine  if worsening. 5/20: BUN/creatinine stable.  Discussed with patient, she states she has mostly been drinking sugar-free sodas.  Agreeable to switching to water and drinking 68 to 50 cc cups today .  Repeat labs 5-2 2 AM 5/22:  BUN/creatinine remain stable, on chart review baseline creatinine ranges between 1.2 and 1.6.  She is at her current baseline, continue to monitor.  10.  Permissive hypertension.  Monitor with increased mobility.  Resume home Norvasc  5 mg daily, lisinopril  10 mg daily as needed    01/03/2024    5:20 AM 01/02/2024    7:36 PM 01/02/2024    1:05 PM  Vitals with BMI  Systolic 122 125 161  Diastolic 83 87 78  Pulse 80 87 92  5/18 BP intermittently elevated, if continues consider increase Norvasc .  For now we will continue to monitor as do not want to cause hypotension/hypoperfusion 5-19: BP currently in good range, monitor 5/20: BP elevated overnight, resume norvasc  5 mg daily (no BP meds ordered?) 5/21: Increase Norvasc  to 10 mg daily.  Add as needed hydralazine  25 mg every 8 hours as needed for elevated systolic and diastolic blood pressures.  Give a few days for Norvasc  to work. 5-22: Normotensive.  Monitor.  11.  Hyperlipidemia.  Crestor  12.  GERD.  Protonix  13.  B12 deficiency             - Outpatient B12 injection.  Try to determine when she had this last 14. Chronic constipation             -Reports BM yesterday             -Miralax  and senokot   -LBM 5/16, patient reports she does not feel constipated and does not have bowel movement every day.  Continue current regimen for now and monitor   5-20: Last bowel movement 5-16, will schedule Senokot S 2 tabs twice daily.  Last bowel movement 5-21, large, continent   15.  Right hand spasticity.  Baclofen  5 mg twice daily added yesterday.  - 5-22: Encouraged patient to use squeeze foam with therapies to activate and stretch hand in the morning.  Will avoid further baclofen  increases due to CKD.  LOS: 6 days A FACE TO FACE EVALUATION WAS PERFORMED  Bea Lime 01/03/2024, 10:31 AM

## 2024-01-03 NOTE — Progress Notes (Signed)
 Occupational Therapy Session Note  Patient Details  Name: Haley Jones MRN: [id number] Date of Birth: 1955/11/18  Today's Date: 01/03/2024 OT Individual Time: 1006-1102 OT Individual Time Calculation (min): 56 min    Short Term Goals: Week 1:  OT Short Term Goal 1 (Week 1): Patient will complete sit to stand using RW with close S OT Short Term Goal 2 (Week 1): The pt will complete UB/LB dressing with s/u A for UB and MinA for LB using the hemi technique OT Short Term Goal 3 (Week 1): The pt will demonstrate increase attention to the Rt Side of the body 80% of the time following intial cues and demostration. OT Short Term Goal 4 (Week 1): The pt will improve bilateral hand coordination by incorporating the RUE as a prime mover 80% of the time OT Short Term Goal 5 (Week 1): The pt wil partipate in cognitive activities with 80% accuracy involving  memory and with 1-5 steps iinstuction.  Skilled Therapeutic Interventions/Progress Updates:  Pt greeted seated in w/c, pt agreeable to OT intervention.      Transfers/bed mobility/functional mobility: pt completed sit>stands with MIN A from w/c, pt prefers to pull up on RW despite education on pushing up from sitting surface.   Therapeutic activity:  Pt completed functional reaching task with RUE to retrieve bean bags from R side and reach across midline to toss to target. Emphasis on RUE motor planning and RUE FMC d/t RUE apraxia. Graded task up with pt instructed to grasp weighted clothespins and pin to netting of basketball goal with RUE. Pt needed max multimodal to problem solve how to grasp pins using more MP flexion vs gross grasp. Pt noted to only don clothespin on L side of net, pt reports her R inattention in her baseline.   Pt completed functional ambulation task with pt instructed to retrieve bean bags from one side of gym and walk ~ 54ft to clip bean bags to matching clothespin. Pt reports "this feels uncomfortable" when attempting  to complete bilateral integration task, therefore downgraded task and instructed pt place bean bags through basketball goal to challenge RUE motor planning. Pt completed functional ambulation with RW and MINA, pt with difficulty letting go of RW with RUE when attempting to reach for stimulus. Pt needed to open bilateral digits to fully let go of RW.    Ended session with pt seated in w/c with all needs within reach and safety belt alarm activated.                    Therapy Documentation Precautions:  Precautions Precautions: Fall, Other (comment) (R neglect and R visual field cut) Recall of Precautions/Restrictions: Impaired Restrictions Weight Bearing Restrictions Per Provider Order: No  Pain: unrated pain reported in R shoulder, rest breaks provided as needed.     Therapy/Group: Individual Therapy  Mollie Anger [facility] 01/03/2024, 11:27 AM

## 2024-01-03 NOTE — Progress Notes (Signed)
 Speech Language Pathology Daily Session Note  Patient Details  Name: Haley Jones MRN: [id number] Date of Birth: [birth year]  Today's Date: 01/03/2024 SLP Individual Time: 0900-1000 SLP Individual Time Calculation (min): 60 min  Short Term Goals: Week 1: SLP Short Term Goal 1 (Week 1): Patient will demonstrate problem solving in mildly complex situations given min multimodal A SLP Short Term Goal 2 (Week 1): Patient will sustain attention to task for 20 minutes given min assist with limited tangential interruptions. SLP Short Term Goal 3 (Week 1): Patient will utilize external and internal memory strategies to recall new daily information with 80% accuracy given min assist. SLP Short Term Goal 4 (Week 1): Patient will demonstrate intellectual awareness of deficits by stating 2 cognitive deficits given mod verbal cue  Skilled Therapeutic Interventions:  Pt greeted at bedside. She was awake/alert in bed upon SLP arrival. She was very pleasant and cooperative throughout tx tasks targeting cognition. She requested assistance with transferring to her chair and required minA during stand pivot transfer via RW. She also required minA for organization and sequencing during the transfer. She required an extensive amount of processing time, but was able to complete a visual organization task w/ words given s verbal cues. She demonstrated slightly improved insight into deficits and carryover of compensatory strategies, stating "it's probably on my R" and "the PT keeps telling me to look to the R." She benefited from [facility] for reasoning and intellectual awareness during conversation re plan for upcoming d/c. She continues to demonstrate limited to no mental flexibility re assistance from her sister at d/c, however, anticipate baseline personality negatively impacts this. At the end of tx tasks, she was left in her chair with the alarm set and call light within reach. Recommend cont ST.    Pain Pain  Assessment Pain Score: 3   Therapy/Group: Individual Therapy  [name] 01/03/2024, 9:48 AM

## 2024-01-04 DIAGNOSIS — F418 Other specified anxiety disorders: Secondary | ICD-10-CM | POA: Diagnosis not present

## 2024-01-04 DIAGNOSIS — I63512 Cerebral infarction due to unspecified occlusion or stenosis of left middle cerebral artery: Secondary | ICD-10-CM | POA: Diagnosis not present

## 2024-01-04 LAB — GLUCOSE, CAPILLARY
Glucose-Capillary: 162 mg/dL — ABNORMAL HIGH (ref 70–99)
Glucose-Capillary: 105 mg/dL — ABNORMAL HIGH (ref 70–99)
Glucose-Capillary: 105 mg/dL — ABNORMAL HIGH (ref 70–99)
Glucose-Capillary: 168 mg/dL — ABNORMAL HIGH (ref 70–99)

## 2024-01-04 LAB — CREATININE, SERUM
Creatinine, Ser: 1.27 mg/dL — ABNORMAL HIGH (ref 0.44–1.00)
GFR, Estimated: 47 mL/min — ABNORMAL LOW (ref >60)

## 2024-01-04 MED ORDER — ACETAMINOPHEN 500 MG PO TABS
1000.0000 mg | ORAL_TABLET | Freq: Three times a day (TID) | ORAL | Status: DC
Start: 2024-01-04 — End: 2024-01-18
  Administered 2024-01-04 – 2024-01-18 (×41): 1000 mg via ORAL
  Filled 2024-01-04 (×42): qty 2

## 2024-01-04 MED ORDER — LISINOPRIL 5 MG PO TABS
5.0000 mg | ORAL_TABLET | Freq: Every day | ORAL | Status: DC
  Administered 2024-01-04 – 2024-01-08 (×5): 5 mg via ORAL
  Filled 2024-01-04 (×5): qty 1

## 2024-01-04 MED ORDER — INSULIN ASPART 100 UNIT/ML IJ SOLN
7.0000 [IU] | Freq: Three times a day (TID) | INTRAMUSCULAR | Status: DC
  Administered 2024-01-04 – 2024-01-08 (×13): 7 [IU] via SUBCUTANEOUS

## 2024-01-04 MED ORDER — LIDOCAINE 5 % EX PTCH
1.0000 | MEDICATED_PATCH | CUTANEOUS | Status: DC
  Administered 2024-01-04 – 2024-01-14 (×11): 1 via TRANSDERMAL
  Filled 2024-01-04 (×11): qty 1

## 2024-01-04 MED ORDER — DICLOFENAC SODIUM 1 % EX GEL
2.0000 g | Freq: Four times a day (QID) | CUTANEOUS | Status: DC
  Administered 2024-01-04 – 2024-01-18 (×46): 2 g via TOPICAL
  Filled 2024-01-04 (×2): qty 100

## 2024-01-04 NOTE — Consult Note (Signed)
 Neuropsychological Consultation Comprehensive Inpatient Rehab   Patient:   [name]   DOB:   [birth year]  MR Number:  [id number]  Location:  [facility] [address] CENTER [address] [place] [address] Dept: [phone] Loc: [phone]           Date of Service:   01/04/2024  Start Time:   9 AM End Time:   10 AM  Provider/Observer:  Chapman Commodore, Psy.D.       Clinical Neuropsychologist       Billing Code/Service: [phone]  Reason for Service:    Haley Jones is 68 year old female referred for neuropsychological consultation during her ongoing admission to the comprehensive inpatient rehabilitation unit.  Patient was referred due to ongoing cognitive difficulties with a prior history of depression and more importantly anxiety type symptoms.  Patient recently had a CVA and residual functional deficits leading to current admission.  Patient has a past medical history including hypertension, diabetes, B12 deficiency, hyperlipidemia, chronic kidney disease stage III, and depression.  Patient presented to [facility] on 12/20/2023 with right sided weakness and multiple falls attributed to dizziness.  Patient noted dizziness for the past 2 to 3 weeks and had been seen by PCP who started meclizine.  During initial presentation MRI was performed that showed patchy acute cortical/subcortical infarcts within the left parietal/occipital lobes mainly in the MCA vascular territory.  There was some subtle hemorrhagic effect noted within regions of infarct.  There was also left MCA territory cortical infarct noted within the left insula.  Chronic small vessel ischemic disease with chronic lacunar infarcts were also noted.  During today's visit the patient was very talkative but acknowledged issues with anxiety longstanding but denied that there have been any significant exacerbation of her anxiety and  certainly that her anxiety was not keeping her from participating in therapeutic efforts.  Patient described motivation to continue to work on her ongoing therapy efforts.  Patient was somewhat tangential and there were difficulties for the patient staying focused.  It was hard to differentiate how much of this was due to longstanding personality variables and other longstanding features versus results of her recent cerebrovascular accident.  Today we worked on coping and adjustment issues with excessive hospital stay.  The patient was relatively nave about variables that increased her stroke risk and we reviewed those issues and efforts that she will need to continue to maintain postdischarge.  HPI for the current admission:    HPI: Haley Jones is a 68 year old left-handed female with history significant for hypertension, B12 deficiency, type 2 diabetes mellitus, hyperlipidemia, CKD stage III, depression.  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  She reports her daughter passed away on the day he was hospitalized.  She has several family members in the area who can provide assistance.  1 level home with ramped entrance.  Presented to [facility] 12/20/2023 with  right side weakness/falls and dizziness.  By report she had had dizziness for the last 2 to 3 weeks she did see her PCP and was started on meclizine.  MRI of the brain showed patchy acute cortical/subcortical infarcts within the left parietal occipital lobes individually measuring up to 3 cm (MCA vascular territory).  Subtle petechial hemorrhage associated with some of these infarcts.  No frank hemorrhagic conversion.  Additional punctate left MCA territory cortical acute infarct within the left insula.  Background parenchymal  atrophy and chronic small vessel ischemic disease with chronic lacunar infarcts.  CT angio showed severe stenosis/nonocclusive thrombus of the left MCA bifurcation affecting the  inferior division.  Nonocclusive embolus also visible within the left parietal branch as well.  Admission chemistries unremarkable except glucose 262 creatinine 1.61, and latest hemoglobin A1c 8.4.  Echocardiogram ejection fraction of 60 to 65% no wall motion abnormalities grade 1 diastolic dysfunction.  Neurology follow-up placed on aspirin  81 mg daily and Plavix  75 mg daily x 90 days then Plavix  alone.  Lovenox  added for DVT prophylaxis.  Patient initially with permissive hypertension with Norvasc  and lisinopril  slowly to be resumed.  Tolerating a regular consistency diet.  Therapy evaluations completed due to patient's right side weakness and decreased functional mobility was admitted for a comprehensive rehab program   Medical History:   Past Medical History:  Diagnosis Date   Adnexal mass    Anxiety    Chronic back pain    Depression    Diabetes mellitus without complication (HCC)    Endometriosis 03/12/2018   GERD (gastroesophageal reflux disease)    Heart murmur 02/2018   undetected until she was an adult. 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 her dress was Appropriate and she was Well Groomed and her manners were Appropriate to the situation.  her participation was indicative of Appropriate and Redirectable behaviors.  There were physical disabilities noted.  she displayed an appropriate level of cooperation and motivation.    Interactions:    Active Redirectable  Attention:   abnormal and attention span appeared shorter than expected for age  Memory:   within normal limits; recent and remote memory intact  Visuo-spatial:   not examined  Speech (Volume):  normal  Speech:   normal; rapid  Thought Process:  Circumstantial and Tangential  Coherent  Though Content:  WNL; not suicidal and not homicidal  Orientation:   person, place, and situation  Judgment:   Fair  Planning:   Poor  Affect:    Excited  Mood:    Euthymic  Insight:   Fair  Intelligence:   normal  Psychiatric History:  Patient with prior psychiatric history including anxiety and depression diagnoses but patient notes that her symptoms are primarily anxiety based.   Family Med/Psych History:  Family History  Problem Relation Age of Onset   Melanoma Mother    Lung cancer Father    Ovarian cancer Maternal Grandmother    Breast cancer Neg Hx  Risk of Suicide/Violence: virtually non-existent patient denies any suicidal or homicidal ideation.    Impression/DX:   Kathreen Dileo is 68 year old female referred for neuropsychological consultation during her ongoing admission to the comprehensive inpatient rehabilitation unit.  Patient was referred due to ongoing cognitive difficulties with a prior history of depression and more importantly anxiety type symptoms.  Patient recently had a CVA and residual functional deficits leading to current admission.  Patient  has a past medical history including hypertension, diabetes, B12 deficiency, hyperlipidemia, chronic kidney disease stage III, and depression.  Patient presented to [facility] on 12/20/2023 with right sided weakness and multiple falls attributed to dizziness.  Patient noted dizziness for the past 2 to 3 weeks and had been seen by PCP who started meclizine.  During initial presentation MRI was performed that showed patchy acute cortical/subcortical infarcts within the left parietal/occipital lobes mainly in the MCA vascular territory.  There was some subtle hemorrhagic effect noted within regions of infarct.  There was also left MCA territory cortical infarct noted within the left insula.  Chronic small vessel ischemic disease with chronic lacunar infarcts were also noted.  During today's visit the patient was very talkative but acknowledged issues with anxiety longstanding but denied that there have been any significant exacerbation of her anxiety and certainly that her anxiety was not keeping her from participating in therapeutic efforts.  Patient described motivation to continue to work on her ongoing therapy efforts.  Patient was somewhat tangential and there were difficulties for the patient staying focused.  It was hard to differentiate how much of this was due to longstanding personality variables and other longstanding features versus results of her recent cerebrovascular accident.  Today we worked on coping and adjustment issues with excessive hospital stay.  The patient was relatively nave about variables that increased her stroke risk and we reviewed those issues and efforts that she will need to continue to maintain postdischarge.   Diagnosis:    Depression with primary anxiety         Electronically Signed   _______________________ Chapman Commodore, Psy.D. Clinical Neuropsychologist

## 2024-01-04 NOTE — Plan of Care (Signed)
  Problem: Consults Goal: RH STROKE PATIENT EDUCATION Description: See Patient Education module for education specifics  Outcome: Progressing   Problem: RH BOWEL ELIMINATION Goal: RH STG MANAGE BOWEL WITH ASSISTANCE Description: STG Manage Bowel with Assistance. Outcome: Progressing   Problem: RH BLADDER ELIMINATION Goal: RH STG MANAGE BLADDER WITH ASSISTANCE Description: STG Manage Bladder With supervision Assistance Outcome: Progressing   Problem: RH SKIN INTEGRITY Goal: RH STG SKIN FREE OF INFECTION/BREAKDOWN Description: Manage skin free of infection/breakdown with supervision Outcome: Progressing   Problem: RH SAFETY Goal: RH STG ADHERE TO SAFETY PRECAUTIONS W/ASSISTANCE/DEVICE Description: STG Adhere to Safety Precautions With supervision Assistance/Device. Outcome: Progressing   Problem: RH PAIN MANAGEMENT Goal: RH STG PAIN MANAGED AT OR BELOW PT'S PAIN GOAL Description: <4 w/ prns Outcome: Progressing   Problem: RH KNOWLEDGE DEFICIT Goal: RH STG INCREASE KNOWLEDGE OF DIABETES Description: Manage increase knowledge of diabetes with supervision from sister using educational materials provided Outcome: Progressing Goal: RH STG INCREASE KNOWLEDGE OF HYPERTENSION Description: Manage increase knowledge of hypertension with supervision from sister using educational materials provided Outcome: Progressing Goal: RH STG INCREASE KNOWLEGDE OF HYPERLIPIDEMIA Description: Manage increase knowledge of hyperlipidemia with supervision from sister using educational materials provided Outcome: Progressing Goal: RH STG INCREASE KNOWLEDGE OF STROKE PROPHYLAXIS Description: Manage increase knowledge of stroke prophylaxis  with supervision from sister using educational materials provided Outcome: Progressing   Problem: Consults Goal: RH STROKE PATIENT EDUCATION Description: See Patient Education module for education specifics  Outcome: Progressing

## 2024-01-04 NOTE — Progress Notes (Signed)
 Physical Therapy Session Note  Patient Details  Name: Haley Jones MRN: [id number] Date of Birth: 11/22/1955  Today's Date: 01/04/2024 PT Individual Time: 1415-1525 PT Individual Time Calculation (min): 70 min   Short Term Goals: Week 1:  PT Short Term Goal 1 (Week 1): STG=LTG due to ELOS  Skilled Therapeutic Interventions/Progress Updates:     Pt received seated in [facility] and agrees to therapy. No complaint of pain. WC transport to gym. Pt performs sit to stand with minA and cues for hand placement. Pt ambulates x100', initially with minA and progressing to modA due to pt's Rt sided inattention, and pt with decreased safety awareness. Pt becomes anxious of falling and very distracted, and has a difficulty time responding to attempts at redirection. Following rest break, pt tasked with ambulating between green line in hallway and wall to provide clear indications of desired path. Pt completes x75' with minA and only one instance of reminding pt to stay between line and wall. Activity progressed by having pt ambulate between two sets of bowling pins with task of not knocking over pins. Pt completes with minA and successfully avoiding pins. Pt then tasked with ambulating same path, with added component of retrieving latex circles on Rt side. Pt remembers to reach for circles but has decreased attention to cones, knocking over multiple cones during attempt. Pt then tasked with ambulating between cones but in a "slalom" pattern. Pt completes without knocking over cones but requires max cueing to complete task correctly, attempting to ambulate in circles around cones despite cueing to follow path. Final challenge involves pt attempting to retrieve latex circles balanced on top of pins while ambulating through slalom path. Pt successfully retrieve 0/8 circles, demonstrating significant Rt sided attention and sustained attention impairments. PT also has to cue pt to remember to place Rt hand on RW, as pt  repeatedly attempts ambulating without Rt hand grip. WC transport back to room. Left seated with alarm intact and all needs within reach.   Therapy Documentation Precautions:  Precautions Precautions: Fall, Other (comment) (R neglect and R visual field cut) Recall of Precautions/Restrictions: Impaired Restrictions Weight Bearing Restrictions Per Provider Order: No   Therapy/Group: Individual Therapy  Neva Barban, PT, DPT 01/04/2024, 3:29 PM

## 2024-01-04 NOTE — Progress Notes (Signed)
 PROGRESS NOTE   Subjective/Complaints:  Ongoing pain in right hand overnight, along with low back.  Patient states that low back pain is chronic, undiagnosed, localized to her low back, sometimes treated with ice at home.  Hand continues to feel tight, difficulty with grip. No other needs, concerns.  Vitals overall stable. Blood sugars are elevated 100s to 180s.  ROS: Patient denies fever, new vision changes, dizziness, nausea, vomiting, diarrhea,  shortness of breath or chest pain, or mood change. + Headache -resolved + Right hand tightness-ongoing + Low back pain  Objective:   No results found. No results for input(s): "WBC", "HGB", "HCT", "PLT" in the last 72 hours.  Recent Labs    01/03/24 0618 01/04/24 0614  NA 138  --   K 4.0  --   CL 105  --   CO2 23  --   GLUCOSE 155*  --   BUN 25*  --   CREATININE 1.34* 1.27*  CALCIUM  9.0  --     Intake/Output Summary (Last 24 hours) at 01/04/2024 1606 Last data filed at 01/03/2024 1803 Gross per 24 hour  Intake 240 ml  Output --  Net 240 ml         Physical Exam: Vital Signs Blood pressure (!) 140/83, pulse 96, temperature 97.9 F (36.6 C), temperature source Oral, resp. rate 18, height 4\' 9"  (1.448 m), weight 70.7 kg, SpO2 97%.   General: No apparent distress, laying in bed. HEENT: Head is normocephalic, atraumatic, MMM Neck: Supple without JVD or lymphadenopathy Heart: Reg rate and rhythm. No murmurs rubs or gallops Chest: CTAB, nonlabored breathing Abdomen: Soft, non-tender, non-distended, bowel sounds positive. Extremities: No clubbing, cyanosis, or edema. Pulses are 2+ Psych: Pt's affect is appropriate. Pt is cooperative Skin: No apparent skin breakdown, no apparent lesions. Neuro:    Mental Status: AAOx4,  Right sided neglect   Some decreased attention and delay in processing, mild. Speech/Languate: Naming and repetition intact, fluent, follows  simple commands., no aphasia noted CRANIAL NERVES: Right facial weakness, mild right homonymous hemianopsia, otherwise intact.  RUE: 4-/5 Deltoid, 4/5 Biceps, 4/5 Triceps,4/5 Grip LUE: 5/5 Deltoid, 5/5 Biceps, 5/5 Triceps, 5/5 Grip RLE: HF 4/5, KE 4+/5, ADF 4+/5, APF 4+/5 LLE: HF 5/5, KE 5/5, ADF 5/5, APF 5/5 Unchanged 5-23  Tone: MAS 1 right finger flexors, elbow flexor--now trace.  Otherwise, MAS 0   SENSORY: Normal to touch all 4 extremities   Coordination: Finger-nose altered on the right slightly   MSK: No joint swelling noted Full passive range of motion all 4 extremities No tenderness to palpation over bilateral lumbar paraspinals or spinous processes.  No apparent deformity.  Assessment/Plan: 1. Functional deficits which require 3+ hours per day of interdisciplinary therapy in a comprehensive inpatient rehab setting. Physiatrist is providing close team supervision and 24 hour management of active medical problems listed below. Physiatrist and rehab team continue to assess barriers to discharge/monitor patient progress toward functional and medical goals  Care Tool:  Bathing    Body parts bathed by patient: Right arm, Left arm, Chest, Abdomen, Front perineal area, Buttocks, Right upper leg, Left upper leg, Right lower leg, Left lower leg, Face  Bathing assist Assist Level: Moderate Assistance - Patient 50 - 74%     Upper Body Dressing/Undressing Upper body dressing   What is the patient wearing?: Pull over shirt    Upper body assist Assist Level: Moderate Assistance - Patient 50 - 74%    Lower Body Dressing/Undressing Lower body dressing      What is the patient wearing?: Incontinence brief, Pants     Lower body assist Assist for lower body dressing: Moderate Assistance - Patient 50 - 74%     Toileting Toileting    Toileting assist Assist for toileting: Moderate Assistance - Patient 50 - 74%     Transfers Chair/bed transfer  Transfers  assist  Chair/bed transfer activity did not occur: Safety/medical concerns  Chair/bed transfer assist level: Moderate Assistance - Patient 50 - 74%     Locomotion Ambulation   Ambulation assist      Assist level: Minimal Assistance - Patient > 75% Assistive device: Walker-rolling Max distance: 80'   Walk 10 feet activity   Assist     Assist level: Minimal Assistance - Patient > 75% Assistive device: Walker-rolling   Walk 50 feet activity   Assist    Assist level: Minimal Assistance - Patient > 75% Assistive device: Walker-rolling    Walk 150 feet activity   Assist    Assist level: Minimal Assistance - Patient > 75% Assistive device: Walker-rolling    Walk 10 feet on uneven surface  activity   Assist     Assist level: Minimal Assistance - Patient > 75% Assistive device: Walker-rolling   Wheelchair     Assist Is the patient using a wheelchair?: No             Wheelchair 50 feet with 2 turns activity    Assist            Wheelchair 150 feet activity     Assist          Blood pressure (!) 140/83, pulse 96, temperature 97.9 F (36.6 C), temperature source Oral, resp. rate 18, height 4\' 9"  (1.448 m), weight 70.7 kg, SpO2 97%.  Medical Problem List and Plan: 1. 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 Motor planning difficulty with RUE per therapies. Limited by inattention. Mod A for transfers and ambulation with a walker. Mod A cognition/attention. Insight poor - has trouble being open to assitance.   2.  Antithrombotics: -DVT/anticoagulation:  Pharmaceutical: Lovenox              -antiplatelet therapy: Aspirin  81 mg daily and Plavix  75 mg daily x 3 months then Plavix  alone  3. Pain Management: Tylenol  as needed  - 5/17 occasional headache  control with Tylenol , continue to monitor  5/19: Pain worse nightly, however patient wishes to continue with repositioning and as needed medications.  No additions at this time.  5-22: ?  Possible underlying carpal tunnel syndrome affecting bilateral hand soreness in the morning--may benefit from outpatient EMG  5/23: Limited pain management options due to extensive allergy list.  Add Voltaren gel to bilateral hands and low back, lidocaine  patch to low back, and aqua thermia as needed for chronic low back  pain, localized  4. Mood/Behavior/Sleep: Effexor  37.5 mg daily, Klonopin  1 mg nightly             -antipsychotic agents: N/A             -Pt reports daughter passed away around the time she was admitted, consider neuropsych consult             -She is not interested in any adjustment to antidepressant medications at this time  5. Neuropsych/cognition: This patient is capable of making decisions on her own behalf.   - 5/20: Would benefit from neuropsych consult; recent death of her daughter   12. Skin/Wound Care: Routine skin checks 7. Fluids/Electrolytes/Nutrition: Routine in and outs with follow-up chemistries 8.  Diabetes mellitus.  Hemoglobin A1c 8.4.  Presently on NovoLog  5 units 3 times daily, Semglee  10 units daily.  Check blood sugars AC and at bedtime.  Diabetic teaching.  Prior to admission patient on Trulicity  0.75 mg weekly, Amaryl  2 mg daily, Glucophage 500 mg in the morning and 1000 mg in the evening, Glucotrol XL 10 mg daily.  Monitor with increased activity CBG (last 3)  Recent Labs    01/03/24 2035 01/04/24 0618 01/04/24 1148  GLUCAP 186* 162* 105*  Blood sugars well-controlled on current regimen  9.  AKI on CKD stage III.  Follow-up chemistries -5-19: BUN/creatinine elevated from 17/1 0.19-26/1.39.  P.o. intake seem adequate, continue to encourage and will give 500 cc bolus today.  Repeat labs in AM.  No obvious medication contributions but may need to just adjust  venlafaxine  if worsening. 5/20: BUN/creatinine stable.  Discussed with patient, she states she has mostly been drinking sugar-free sodas.  Agreeable to switching to water and drinking 68 to 50 cc cups today .  Repeat labs 5-2 2 AM 5/22: BUN/creatinine remain stable, on chart review baseline creatinine ranges between 1.2 and 1.6.  She is at her current baseline, continue to monitor. 5/23: Creatinine 1.27.  Within baseline, monitor  10.  Permissive hypertension.  Monitor with increased mobility.  Resume home Norvasc  5 mg daily, lisinopril  10 mg daily as needed    01/04/2024    1:17 PM 01/04/2024    4:47 AM 01/03/2024    7:27 PM  Vitals with BMI  Systolic 140 163 478  Diastolic 83 86 75  Pulse 96 72 96  5/18 BP intermittently elevated, if continues consider increase Norvasc .  For now we will continue to monitor as do not want to cause hypotension/hypoperfusion 5-19: BP currently in good range, monitor 5/20: BP elevated overnight, resume norvasc  5 mg daily (no BP meds ordered?) 5/21: Increase Norvasc  to 10 mg daily.  Add as needed hydralazine  25 mg every 8 hours as needed for elevated systolic and diastolic blood pressures.  Give a few days for Norvasc  to work. 5-22: Normotensive.  Monitor. 5-23: Blood pressure elevated.  Resume home losartan at 5 mg daily.  11.  Hyperlipidemia.  Crestor  12.  GERD.  Protonix  13.  B12 deficiency             - Outpatient B12 injection.  Try to determine when she had this last 14. Chronic constipation             -Reports BM yesterday             -Miralax  and senokot   -LBM 5/16, patient reports she does not feel constipated and does not have bowel movement every day.  Continue current regimen for now and monitor  5-20: Last bowel movement 5-16, will schedule Senokot S 2 tabs twice daily.  Last bowel movement 5-21, large, continent   15.  Right hand spasticity.  Baclofen 5 mg twice daily added yesterday.  - 5-22: Encouraged patient to use squeeze foam with  therapies to activate and stretch hand in the morning.  Will avoid further baclofen increases due to CKD.  5/23: Voltaren gel as above.  LOS: 7 days A FACE TO FACE EVALUATION WAS PERFORMED  Haley Jones 01/04/2024, 4:06 PM

## 2024-01-04 NOTE — Progress Notes (Signed)
 Speech Language Pathology Weekly Progress and Session Note  Patient Details  Name: Haley Jones MRN: [id number] Date of Birth: [birth year]  Beginning of progress report period: Dec 29, 2023 End of progress report period: Jan 04, 2024  Today's Date: 01/04/2024 SLP Individual Time: 0800-0900 SLP Individual Time Calculation (min): 60 min  Short Term Goals: Week 1: SLP Short Term Goal 1 (Week 1): Patient will demonstrate problem solving in mildly complex situations given min multimodal A SLP Short Term Goal 1 - Progress (Week 1): Met SLP Short Term Goal 2 (Week 1): Patient will sustain attention to task for 20 minutes given min assist with limited tangential interruptions. SLP Short Term Goal 2 - Progress (Week 1): Met SLP Short Term Goal 3 (Week 1): Patient will utilize external and internal memory strategies to recall new daily information with 80% accuracy given min assist. SLP Short Term Goal 3 - Progress (Week 1): Not met SLP Short Term Goal 4 (Week 1): Patient will demonstrate intellectual awareness of deficits by stating 2 cognitive deficits given mod verbal cue SLP Short Term Goal 4 - Progress (Week 1): Discontinued (comment) (not met - discontinued)    New Short Term Goals: Week 2: SLP Short Term Goal 1 (Week 2): STGs = LTGs d/t ELOS  Weekly Progress Updates: Slight progress noted this week, as evidenced by mastery of 2/4 goals. Pt demonstrates improving attention, problem solving, and slightly improved memory. However, lack of mental flexibility and insight continue to negatively impact use of compensatory strategies. Pt education ongoing at this time. Family education/training needed as well d/t pt's memory deficits. She would benefit from continued ST to target remaining cognitive-linguistic deficits, facilitate return to prev roles/responsibilities, and maximize pt independence.    Intensity: Minumum of 1-2 x/day, 30 to 90 minutes Frequency: 3 to 5 out of 7  days Duration/Length of Stay: 5/30 Treatment/Interventions: Cognitive remediation/compensation;Environmental controls;Functional tasks;Internal/external aids;Patient/family education;Speech/Language facilitation;Cueing hierarchy;Therapeutic Activities   Daily Session  Skilled Therapeutic Interventions:  Pt greeted at bedside. She was awake/alert upon SLP arrival, scrolling on her phone. SLP facilitated a variety of tx tasks targeting cognition this date. She reported that she had requested someone assist her to change her clothes prior to ST tx session, but was unable to provide any details re hitting her call light or who her RNT/nurse was today. SLP encouraged pt to complete tx session as is. SLP then initiated med management task given pt report of using a pill organizer prior to hospital admission. She required minA to recall her prev and current meds, including timing of administration. She independently completed the organizer with ~75% accuracy, increasing to ~90% accuracy with minA visual/verbal cues. She continues to present w/ reduced mental flexibility and awareness, as she stated "if I were at my kitchen table I'd be able to do this." SLP provided continued education re cognitive deficits and their negative impact on ADLs/IADLs. At the end of tx tasks, she was left in her bed with the alarm set and call light within reach. Recommend cont ST.    Pain Pain Assessment Pain Score: 3  Sore - overall body  Received tylenol  prior to ST tx session. See EMR for more info.   Therapy/Group: Individual Therapy  [name] 01/04/2024, 8:56 AM

## 2024-01-04 NOTE — Progress Notes (Signed)
 Occupational Therapy Weekly Progress Note  Patient Details  Name: Haley Jones MRN: [id number] Date of Birth: 06-17-1956  Beginning of progress report period: Dec 29, 2023 End of progress report period: Jan 04, 2024  Today's Date: 01/04/2024 OT Individual Time: 4098-1191 OT Individual Time Calculation (min): 68 min    Patient has met 2 of 5 short term goals.  Haley Jones has made good progress toward her OT goals. 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 Bathing is more independent at a CGA- min A level. Her RUE is improving in functional use and she spontaneously incorporates it into ADLs despite struggling more with bimanual integration tasks. Her awareness and insight into deficits remain poor.   Patient continues to demonstrate the following deficits: muscle weakness, decreased cardiorespiratoy endurance, unbalanced muscle activation, decreased coordination, and decreased motor planning, right side neglect, decreased motor planning, and ideational apraxia, decreased attention, decreased awareness, decreased problem solving, decreased safety awareness, decreased memory, and delayed processing, and decreased standing balance, decreased postural control, hemiplegia, and decreased balance strategies and therefore will continue to benefit from skilled OT intervention to enhance overall performance with BADL and iADL.  Patient progressing toward long term goals..  Continue plan of care.  OT Short Term Goals Week 1:  OT Short Term Goal 1 (Week 1): Patient will complete sit to stand using RW with close S OT Short Term Goal 1 - Progress (Week 1): Not met OT Short Term Goal 2 (Week 1): The pt will complete UB/LB dressing with s/u A for UB and MinA for LB using the hemi technique OT Short Term Goal 2 - Progress (Week 1): Not met OT Short Term Goal 3 (Week 1): The pt will demonstrate  increase attention to the Rt Side of the body 80% of the time following intial cues and demostration. OT Short Term Goal 3 - Progress (Week 1): Met OT Short Term Goal 4 (Week 1): The pt will improve bilateral hand coordination by incorporating the RUE as a prime mover 80% of the time OT Short Term Goal 4 - Progress (Week 1): Not met OT Short Term Goal 5 (Week 1): The pt wil partipate in cognitive activities with 80% accuracy involving  memory and with 1-5 steps iinstuction. OT Short Term Goal 5 - Progress (Week 1): Met Week 2:  OT Short Term Goal 1 (Week 2): STG=LTG d/t ELOS  Skilled Therapeutic Interventions/Progress Updates:    Pt received supine with reports of back pain, un-rated, but requesting to not leave room for session. She came to EOB with increased time for problem solving/cueing for bed mobility. She initially could not get EOB but with heavy cueing for body mechanics she was able to finally get EOB with light min A. She sat EOB and completed multiple RUE NMR activities. She initially worked on IADL retraining- folding clothes with BUE, requiring mod cueing for RUE inclusion as her LUE tried to take over. She then worked on functional reaching for a resistive clothespin to challenge hand prehension, as well as reaching into 70 degrees of shoulder flexion with OT facilitating at the elbow for elevation. She worked on IR/ER at the shoulder and bimanual integration, passing clothespins behind her back between BUE. She worked on place and hold with her RUE in 90 degrees of shoulder flexion, she was able to hold at 80 degrees for 5 seconds. She demonstrated improved shoulder flexion overall. She returned to  supine in bed and worked on full shoulder flexion AROM- going from neutral with bilateral grip on a towel to fully overhead. Good scapulo-humeral rhythm and no pain reported. She was left supine with all needs met, bed alarm set.   Therapy Documentation Precautions:  Precautions Precautions:  Fall, Other (comment) (R neglect and R visual field cut) Recall of Precautions/Restrictions: Impaired Restrictions Weight Bearing Restrictions Per Provider Order: No  Therapy/Group: Individual Therapy  [name] 01/04/2024, 6:20 AM

## 2024-01-05 DIAGNOSIS — I63512 Cerebral infarction due to unspecified occlusion or stenosis of left middle cerebral artery: Secondary | ICD-10-CM | POA: Diagnosis not present

## 2024-01-05 LAB — GLUCOSE, CAPILLARY
Glucose-Capillary: 127 mg/dL — ABNORMAL HIGH (ref 70–99)
Glucose-Capillary: 110 mg/dL — ABNORMAL HIGH (ref 70–99)
Glucose-Capillary: 174 mg/dL — ABNORMAL HIGH (ref 70–99)
Glucose-Capillary: 119 mg/dL — ABNORMAL HIGH (ref 70–99)

## 2024-01-05 NOTE — Plan of Care (Signed)
  Problem: Consults Goal: RH STROKE PATIENT EDUCATION Description: See Patient Education module for education specifics  Outcome: Progressing   Problem: RH SAFETY Goal: RH STG ADHERE TO SAFETY PRECAUTIONS W/ASSISTANCE/DEVICE Description: STG Adhere to Safety Precautions With supervision Assistance/Device. Outcome: Progressing   Problem: RH PAIN MANAGEMENT Goal: RH STG PAIN MANAGED AT OR BELOW PT'S PAIN GOAL Description: <4 w/ prns Outcome: Progressing

## 2024-01-05 NOTE — Plan of Care (Signed)
  Problem: Consults Goal: RH STROKE PATIENT EDUCATION Description: See Patient Education module for education specifics  Outcome: Progressing   Problem: RH BOWEL ELIMINATION Goal: RH STG MANAGE BOWEL WITH ASSISTANCE Description: STG Manage Bowel with Assistance. Outcome: Progressing   Problem: RH BLADDER ELIMINATION Goal: RH STG MANAGE BLADDER WITH ASSISTANCE Description: STG Manage Bladder With supervision Assistance Outcome: Progressing   Problem: RH SKIN INTEGRITY Goal: RH STG SKIN FREE OF INFECTION/BREAKDOWN Description: Manage skin free of infection/breakdown with supervision Outcome: Progressing   Problem: RH SAFETY Goal: RH STG ADHERE TO SAFETY PRECAUTIONS W/ASSISTANCE/DEVICE Description: STG Adhere to Safety Precautions With supervision Assistance/Device. Outcome: Progressing   Problem: RH PAIN MANAGEMENT Goal: RH STG PAIN MANAGED AT OR BELOW PT'S PAIN GOAL Description: <4 w/ prns Outcome: Progressing   Problem: RH KNOWLEDGE DEFICIT Goal: RH STG INCREASE KNOWLEDGE OF DIABETES Description: Manage increase knowledge of diabetes with supervision from sister using educational materials provided Outcome: Progressing Goal: RH STG INCREASE KNOWLEDGE OF HYPERTENSION Description: Manage increase knowledge of hypertension with supervision from sister using educational materials provided Outcome: Progressing Goal: RH STG INCREASE KNOWLEGDE OF HYPERLIPIDEMIA Description: Manage increase knowledge of hyperlipidemia with supervision from sister using educational materials provided Outcome: Progressing Goal: RH STG INCREASE KNOWLEDGE OF STROKE PROPHYLAXIS Description: Manage increase knowledge of stroke prophylaxis  with supervision from sister using educational materials provided Outcome: Progressing   Problem: Consults Goal: RH STROKE PATIENT EDUCATION Description: See Patient Education module for education specifics  Outcome: Progressing

## 2024-01-05 NOTE — Progress Notes (Signed)
 PROGRESS NOTE   Subjective/Complaints: Doing well No new complaints this morning Aware of d/c date No issues overnight  ROS: Patient denies fever, new vision changes, dizziness, nausea, vomiting, diarrhea,  shortness of breath or chest pain, or mood change. + Headache -resolved + Right hand tightness-ongoing + Low back pain  Objective:   No results found. No results for input(s): "WBC", "HGB", "HCT", "PLT" in the last 72 hours.  Recent Labs    01/03/24 0618 01/04/24 0614  NA 138  --   K 4.0  --   CL 105  --   CO2 23  --   GLUCOSE 155*  --   BUN 25*  --   CREATININE 1.34* 1.27*  CALCIUM  9.0  --     Intake/Output Summary (Last 24 hours) at 01/05/2024 1454 Last data filed at 01/05/2024 1327 Gross per 24 hour  Intake 660 ml  Output --  Net 660 ml         Physical Exam: Vital Signs Blood pressure 122/78, pulse 85, temperature 98 F (36.7 C), temperature source Oral, resp. rate 16, height 4\' 9"  (1.448 m), weight 70.7 kg, SpO2 98%.   General: No apparent distress, laying in bed. HEENT: Head is normocephalic, atraumatic, MMM Neck: Supple without JVD or lymphadenopathy Heart: Reg rate and rhythm. No murmurs rubs or gallops Chest: CTAB, nonlabored breathing Abdomen: Soft, non-tender, non-distended, bowel sounds positive. Extremities: No clubbing, cyanosis, or edema. Pulses are 2+ Psych: Pt's affect is appropriate. Pt is cooperative Skin: No apparent skin breakdown, no apparent lesions. Neuro:    Mental Status: AAOx4,  Right sided neglect   Some decreased attention and delay in processing, mild. Speech/Languate: Naming and repetition intact, fluent, follows simple commands., no aphasia noted CRANIAL NERVES: Right facial weakness, mild right homonymous hemianopsia, otherwise intact.  RUE: 4-/5 Deltoid, 4/5 Biceps, 4/5 Triceps,4/5 Grip LUE: 5/5 Deltoid, 5/5 Biceps, 5/5 Triceps, 5/5 Grip RLE: HF 4/5, KE 4+/5,  ADF 4+/5, APF 4+/5 LLE: HF 5/5, KE 5/5, ADF 5/5, APF 5/5 Unchanged 5-23  Tone: MAS 1 right finger flexors, elbow flexor--now trace.  Otherwise, MAS 0   SENSORY: Normal to touch all 4 extremities   Coordination: Finger-nose altered on the right slightly   MSK: No joint swelling noted Full passive range of motion all 4 extremities No tenderness to palpation over bilateral lumbar paraspinals or spinous processes.  No apparent deformity. Stable 5/24  Assessment/Plan: 1. Functional deficits which require 3+ hours per day of interdisciplinary therapy in a comprehensive inpatient rehab setting. Physiatrist is providing close team supervision and 24 hour management of active medical problems listed below. Physiatrist and rehab team continue to assess barriers to discharge/monitor patient progress toward functional and medical goals  Care Tool:  Bathing    Body parts bathed by patient: Right arm, Left arm, Chest, Abdomen, Front perineal area, Buttocks, Right upper leg, Left upper leg, Right lower leg, Left lower leg, Face         Bathing assist Assist Level: Moderate Assistance - Patient 50 - 74%     Upper Body Dressing/Undressing Upper body dressing   What is the patient wearing?: Pull over shirt    Upper body  assist Assist Level: Moderate Assistance - Patient 50 - 74%    Lower Body Dressing/Undressing Lower body dressing      What is the patient wearing?: Incontinence brief, Pants     Lower body assist Assist for lower body dressing: Moderate Assistance - Patient 50 - 74%     Toileting Toileting    Toileting assist Assist for toileting: Moderate Assistance - Patient 50 - 74%     Transfers Chair/bed transfer  Transfers assist  Chair/bed transfer activity did not occur: Safety/medical concerns  Chair/bed transfer assist level: Moderate Assistance - Patient 50 - 74%     Locomotion Ambulation   Ambulation assist      Assist level: Minimal Assistance -  Patient > 75% Assistive device: Walker-rolling Max distance: 80'   Walk 10 feet activity   Assist     Assist level: Minimal Assistance - Patient > 75% Assistive device: Walker-rolling   Walk 50 feet activity   Assist    Assist level: Minimal Assistance - Patient > 75% Assistive device: Walker-rolling    Walk 150 feet activity   Assist    Assist level: Minimal Assistance - Patient > 75% Assistive device: Walker-rolling    Walk 10 feet on uneven surface  activity   Assist     Assist level: Minimal Assistance - Patient > 75% Assistive device: Walker-rolling   Wheelchair     Assist Is the patient using a wheelchair?: No             Wheelchair 50 feet with 2 turns activity    Assist            Wheelchair 150 feet activity     Assist          Blood pressure 122/78, pulse 85, temperature 98 F (36.7 C), temperature source Oral, resp. rate 16, height 4\' 9"  (1.448 m), weight 70.7 kg, SpO2 98%.  Medical Problem List and Plan: 1. 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 Motor planning difficulty with RUE per therapies. Limited by inattention. Mod A for transfers and ambulation with a walker. Mod A cognition/attention. Insight poor - has trouble being open to assitance.   2.  Antithrombotics: -DVT/anticoagulation:  Pharmaceutical: Lovenox              -antiplatelet therapy: Aspirin  81 mg daily and Plavix  75 mg daily x 3 months then Plavix  alone  3. Pain Management: Tylenol  as needed  - 5/17 occasional headache control with Tylenol , continue to monitor  5/19: Pain worse nightly, however patient wishes to continue with repositioning and as needed medications.  No additions at this time.  5-22: ?  Possible underlying carpal tunnel syndrome affecting bilateral  hand soreness in the morning--may benefit from outpatient EMG  5/23: Limited pain management options due to extensive allergy list.  Add Voltaren gel to bilateral hands and low back, lidocaine  patch to low back, and aqua thermia as needed for chronic low back pain, localized  4. Mood/Behavior/Sleep: Effexor  37.5 mg daily, Klonopin  1 mg nightly             -antipsychotic agents: N/A             -  Pt reports daughter passed away around the time she was admitted, consider neuropsych consult             -She is not interested in any adjustment to antidepressant medications at this time  5. Neuropsych/cognition: This patient is capable of making decisions on her own behalf.   - 5/20: Would benefit from neuropsych consult; recent death of her daughter   77. Skin/Wound Care: Routine skin checks 7. Fluids/Electrolytes/Nutrition: Routine in and outs with follow-up chemistries 8.  Diabetes mellitus.  Hemoglobin A1c 8.4.  Presently on NovoLog  5 units 3 times daily, Semglee  10 units daily.  Check blood sugars AC and at bedtime.  Diabetic teaching.  Prior to admission patient on Trulicity  0.75 mg weekly, Amaryl  2 mg daily, Glucophage 500 mg in the morning and 1000 mg in the evening, Glucotrol XL 10 mg daily.  Monitor with increased activity CBG (last 3)  Recent Labs    01/04/24 2051 01/05/24 0614 01/05/24 1137  GLUCAP 168* 127* 110*  Blood sugars well-controlled on current regimen  9.  AKI on CKD stage III.  Follow-up chemistries -5-19: BUN/creatinine elevated from 17/1 0.19-26/1.39.  P.o. intake seem adequate, continue to encourage and will give 500 cc bolus today.  Repeat labs in AM.  No obvious medication contributions but may need to just adjust venlafaxine  if worsening. 5/20: BUN/creatinine stable.  Discussed with patient, she states she has mostly been drinking sugar-free sodas.  Agreeable to switching to water and drinking 68 to 50 cc cups today .  Repeat labs 5-2 2 AM 5/22: BUN/creatinine remain  stable, on chart review baseline creatinine ranges between 1.2 and 1.6.  She is at her current baseline, continue to monitor. 5/23: Creatinine 1.27.  Within baseline, monitor  10.  Permissive hypertension.  Monitor with increased mobility.  Resume home Norvasc  5 mg daily, lisinopril  10 mg daily as needed    01/05/2024    1:09 PM 01/05/2024    4:51 AM 01/04/2024    7:49 PM  Vitals with BMI  Systolic 122 131 782  Diastolic 78 85 64  Pulse 85 81 88  5/18 BP intermittently elevated, if continues consider increase Norvasc .  For now we will continue to monitor as do not want to cause hypotension/hypoperfusion 5-19: BP currently in good range, monitor 5/20: BP elevated overnight, resume norvasc  5 mg daily (no BP meds ordered?) 5/21: Increase Norvasc  to 10 mg daily.  Add as needed hydralazine  25 mg every 8 hours as needed for elevated systolic and diastolic blood pressures.  Give a few days for Norvasc  to work. 5-22: Normotensive.  Monitor. Continue home losartan at 5 mg daily.  11.  Hyperlipidemia. continue Crestor   12.  GERD.  Continue Protonix   13.  B12 deficiency             - Outpatient B12 injection.  Try to determine when she had this last  14. Chronic constipation             -Reports BM yesterday             -Miralax  and senokot   -LBM 5/16, patient reports she does not feel constipated and does not have bowel movement every day.  Continue current regimen for now and monitor   5-20: Last bowel movement 5-16, will schedule Senokot S 2 tabs twice daily.  Last bowel movement 5/22   15.  Right hand spasticity.  Baclofen 5 mg twice daily added yesterday.  - 5-22: Encouraged patient to use squeeze  foam with therapies to activate and stretch hand in the morning.  Will avoid further baclofen increases due to CKD.  Continue voltaren gel  LOS: 8 days A FACE TO FACE EVALUATION WAS PERFORMED  Keven Pel Govani Radloff 01/05/2024, 2:54 PM

## 2024-01-06 DIAGNOSIS — I63512 Cerebral infarction due to unspecified occlusion or stenosis of left middle cerebral artery: Secondary | ICD-10-CM | POA: Diagnosis not present

## 2024-01-06 LAB — GLUCOSE, CAPILLARY
Glucose-Capillary: 170 mg/dL — ABNORMAL HIGH (ref 70–99)
Glucose-Capillary: 244 mg/dL — ABNORMAL HIGH (ref 70–99)
Glucose-Capillary: 83 mg/dL (ref 70–99)
Glucose-Capillary: 110 mg/dL — ABNORMAL HIGH (ref 70–99)

## 2024-01-06 NOTE — Progress Notes (Signed)
 PROGRESS NOTE   Subjective/Complaints: Bruising on left arm Discussed her daughter's death, she is handling it well Discussed her d/c date  ROS: Patient denies fever, new vision changes, dizziness, nausea, vomiting, diarrhea,  shortness of breath or chest pain, or mood change. + Headache -resolved + Right hand tightness-ongoing + Low back pain  Objective:   No results found. No results for input(s): "WBC", "HGB", "HCT", "PLT" in the last 72 hours.  Recent Labs    01/04/24 0614  CREATININE 1.27*    Intake/Output Summary (Last 24 hours) at 01/06/2024 1514 Last data filed at 01/06/2024 1249 Gross per 24 hour  Intake 1080 ml  Output --  Net 1080 ml         Physical Exam: Vital Signs Blood pressure 105/68, pulse 79, temperature 97.8 F (36.6 C), temperature source Oral, resp. rate 16, height 4\' 9"  (1.448 m), weight 70.7 kg, SpO2 99%.   General: No apparent distress, laying in bed. HEENT: Head is normocephalic, atraumatic, MMM Neck: Supple without JVD or lymphadenopathy Heart: Reg rate and rhythm. No murmurs rubs or gallops Chest: CTAB, nonlabored breathing Abdomen: Soft, non-tender, non-distended, bowel sounds positive. Extremities: No clubbing, cyanosis, or edema. Pulses are 2+ Psych: Pt's affect is appropriate. Pt is cooperative Skin: No apparent skin breakdown, no apparent lesions. Neuro:    Mental Status: AAOx4,  Right sided neglect   Some decreased attention and delay in processing, mild. Speech/Languate: Naming and repetition intact, fluent, follows simple commands., no aphasia noted CRANIAL NERVES: Right facial weakness, mild right homonymous hemianopsia, otherwise intact.  RUE: 4-/5 Deltoid, 4/5 Biceps, 4/5 Triceps,4/5 Grip LUE: 5/5 Deltoid, 5/5 Biceps, 5/5 Triceps, 5/5 Grip RLE: HF 4/5, KE 4+/5, ADF 4+/5, APF 4+/5 LLE: HF 5/5, KE 5/5, ADF 5/5, APF 5/5 Unchanged 5-23  Tone: MAS 1 right finger  flexors, elbow flexor--now trace.  Otherwise, MAS 0   SENSORY: Normal to touch all 4 extremities   Coordination: Finger-nose altered on the right slightly   MSK: No joint swelling noted Full passive range of motion all 4 extremities No tenderness to palpation over bilateral lumbar paraspinals or spinous processes.  No apparent deformity. Stable 5/25  Assessment/Plan: 1. Functional deficits which require 3+ hours per day of interdisciplinary therapy in a comprehensive inpatient rehab setting. Physiatrist is providing close team supervision and 24 hour management of active medical problems listed below. Physiatrist and rehab team continue to assess barriers to discharge/monitor patient progress toward functional and medical goals  Care Tool:  Bathing    Body parts bathed by patient: Right arm, Left arm, Chest, Abdomen, Front perineal area, Buttocks, Right upper leg, Left upper leg, Right lower leg, Left lower leg, Face         Bathing assist Assist Level: Moderate Assistance - Patient 50 - 74%     Upper Body Dressing/Undressing Upper body dressing   What is the patient wearing?: Pull over shirt    Upper body assist Assist Level: Moderate Assistance - Patient 50 - 74%    Lower Body Dressing/Undressing Lower body dressing      What is the patient wearing?: Incontinence brief, Pants     Lower body assist Assist for lower  body dressing: Moderate Assistance - Patient 50 - 74%     Toileting Toileting    Toileting assist Assist for toileting: Moderate Assistance - Patient 50 - 74%     Transfers Chair/bed transfer  Transfers assist  Chair/bed transfer activity did not occur: Safety/medical concerns  Chair/bed transfer assist level: Moderate Assistance - Patient 50 - 74%     Locomotion Ambulation   Ambulation assist      Assist level: Minimal Assistance - Patient > 75% Assistive device: Walker-rolling Max distance: 80'   Walk 10 feet activity   Assist      Assist level: Minimal Assistance - Patient > 75% Assistive device: Walker-rolling   Walk 50 feet activity   Assist    Assist level: Minimal Assistance - Patient > 75% Assistive device: Walker-rolling    Walk 150 feet activity   Assist    Assist level: Minimal Assistance - Patient > 75% Assistive device: Walker-rolling    Walk 10 feet on uneven surface  activity   Assist     Assist level: Minimal Assistance - Patient > 75% Assistive device: Walker-rolling   Wheelchair     Assist Is the patient using a wheelchair?: No             Wheelchair 50 feet with 2 turns activity    Assist            Wheelchair 150 feet activity     Assist          Blood pressure 105/68, pulse 79, temperature 97.8 F (36.6 C), temperature source Oral, resp. rate 16, height 4\' 9"  (1.448 m), weight 70.7 kg, SpO2 99%.  Medical Problem List and Plan: 1. 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 Motor planning difficulty with RUE per therapies. Limited by inattention. Mod A for transfers and ambulation with a walker. Mod A cognition/attention. Insight poor - has trouble being open to assitance.   2.  Antithrombotics: -DVT/anticoagulation:  Pharmaceutical: Lovenox              -antiplatelet therapy: Aspirin  81 mg daily and Plavix  75 mg daily x 3 months then Plavix  alone  3. Pain Management: Tylenol  as needed  - 5/17 occasional headache control with Tylenol , continue to monitor  5/19: Pain worse nightly, however patient wishes to continue with repositioning and as needed medications.  No additions at this time.  5-22: ?  Possible underlying carpal tunnel syndrome affecting bilateral hand soreness in the morning--may benefit from outpatient EMG  5/23: Limited pain management options due  to extensive allergy list.  Add Voltaren gel to bilateral hands and low back, lidocaine  patch to low back, and aqua thermia as needed for chronic low back pain, localized  4. Mood/Behavior/Sleep: Effexor  37.5 mg daily, Klonopin  1 mg nightly             -antipsychotic agents: N/A             -Pt reports daughter passed away around the time she was admitted, consider neuropsych consult             -She is not interested in any adjustment to antidepressant medications at this time  5. Neuropsych/cognition: This patient is capable of making decisions on her own behalf.   - 5/20: Would benefit from neuropsych consult; recent death of her daughter   53. Skin/Wound Care: Routine skin checks 7. Fluids/Electrolytes/Nutrition: Routine in and outs with follow-up chemistries 8.  Diabetes mellitus.  Hemoglobin A1c 8.4.  Presently on NovoLog  5 units 3 times daily, Semglee  10 units daily.  Check blood sugars AC and at bedtime.  Diabetic teaching.  Prior to admission patient on Trulicity  0.75 mg weekly, Amaryl  2 mg daily, Glucophage 500 mg in the morning and 1000 mg in the evening, Glucotrol XL 10 mg daily.  Monitor with increased activity CBG (last 3)  Recent Labs    01/05/24 2105 01/06/24 0659 01/06/24 1133  GLUCAP 119* 170* 244*  Blood sugars well-controlled on current regimen  9.  AKI on CKD stage III.  Follow-up chemistries -5-19: BUN/creatinine elevated from 17/1 0.19-26/1.39.  P.o. intake seem adequate, continue to encourage and will give 500 cc bolus today.  Repeat labs in AM.  No obvious medication contributions but may need to just adjust venlafaxine  if worsening. 5/20: BUN/creatinine stable.  Discussed with patient, she states she has mostly been drinking sugar-free sodas.  Agreeable to switching to water and drinking 68 to 50 cc cups today .  Repeat labs 5-2 2 AM 5/22: BUN/creatinine remain stable, on chart review baseline creatinine ranges between 1.2 and 1.6.  She is at her current baseline,  continue to monitor. 5/23: Creatinine 1.27.  Within baseline, monitor  10.  Permissive hypertension.  Monitor with increased mobility.  Resume home Norvasc  5 mg daily, lisinopril  10 mg daily as needed    01/06/2024    1:45 PM 01/06/2024    6:00 AM 01/05/2024    8:22 PM  Vitals with BMI  Systolic 105 123 409  Diastolic 68 86 59  Pulse 79 69 84  5/18 BP intermittently elevated, if continues consider increase Norvasc .  For now we will continue to monitor as do not want to cause hypotension/hypoperfusion 5-19: BP currently in good range, monitor 5/20: BP elevated overnight, resume norvasc  5 mg daily (no BP meds ordered?) 5/21: Increase Norvasc  to 10 mg daily.  Add as needed hydralazine  25 mg every 8 hours as needed for elevated systolic and diastolic blood pressures.  Give a few days for Norvasc  to work. 5-22: Normotensive.  Monitor. Continue home losartan at 5 mg daily.  11.  Hyperlipidemia. Continue Crestor   12.  GERD.  continue Protonix   13.  B12 deficiency             - Outpatient B12 injection.  Try to determine when she had this last  14. Chronic constipation             -Reports BM yesterday             -Miralax  and senokot   -LBM 5/16, patient reports she does not feel constipated and does not have bowel movement every day.  Continue current regimen for now and monitor   5-20: Last bowel movement 5-16, will schedule Senokot S 2 tabs twice daily.  Last bowel movement 5/22   15.  Right hand spasticity.  Baclofen 5 mg twice daily added yesterday.  - 5-22: Encouraged patient to use squeeze foam with therapies to activate and stretch hand in the morning.  Will avoid further baclofen increases due to CKD.  Continue voltaren gel  LOS: 9 days A FACE TO FACE EVALUATION WAS PERFORMED  Keven Pel Virlan Kempker 01/06/2024,  3:14 PM

## 2024-01-06 NOTE — Plan of Care (Signed)
  Problem: Consults Goal: RH STROKE PATIENT EDUCATION Description: See Patient Education module for education specifics  Outcome: Progressing   Problem: RH BOWEL ELIMINATION Goal: RH STG MANAGE BOWEL WITH ASSISTANCE Description: STG Manage Bowel with Assistance. Outcome: Progressing   Problem: RH BLADDER ELIMINATION Goal: RH STG MANAGE BLADDER WITH ASSISTANCE Description: STG Manage Bladder With supervision Assistance Outcome: Progressing   Problem: RH SKIN INTEGRITY Goal: RH STG SKIN FREE OF INFECTION/BREAKDOWN Description: Manage skin free of infection/breakdown with supervision Outcome: Progressing   Problem: RH SAFETY Goal: RH STG ADHERE TO SAFETY PRECAUTIONS W/ASSISTANCE/DEVICE Description: STG Adhere to Safety Precautions With supervision Assistance/Device. Outcome: Progressing   Problem: RH PAIN MANAGEMENT Goal: RH STG PAIN MANAGED AT OR BELOW PT'S PAIN GOAL Description: <4 w/ prns Outcome: Progressing   Problem: RH KNOWLEDGE DEFICIT Goal: RH STG INCREASE KNOWLEDGE OF DIABETES Description: Manage increase knowledge of diabetes with supervision from sister using educational materials provided Outcome: Progressing Goal: RH STG INCREASE KNOWLEDGE OF HYPERTENSION Description: Manage increase knowledge of hypertension with supervision from sister using educational materials provided Outcome: Progressing Goal: RH STG INCREASE KNOWLEGDE OF HYPERLIPIDEMIA Description: Manage increase knowledge of hyperlipidemia with supervision from sister using educational materials provided Outcome: Progressing Goal: RH STG INCREASE KNOWLEDGE OF STROKE PROPHYLAXIS Description: Manage increase knowledge of stroke prophylaxis  with supervision from sister using educational materials provided Outcome: Progressing   Problem: Consults Goal: RH STROKE PATIENT EDUCATION Description: See Patient Education module for education specifics  Outcome: Progressing

## 2024-01-06 NOTE — Plan of Care (Signed)
  Problem: Consults Goal: RH STROKE PATIENT EDUCATION Description: See Patient Education module for education specifics  Outcome: Progressing   Problem: RH BLADDER ELIMINATION Goal: RH STG MANAGE BLADDER WITH ASSISTANCE Description: STG Manage Bladder With supervision Assistance Outcome: Progressing

## 2024-01-07 DIAGNOSIS — I63512 Cerebral infarction due to unspecified occlusion or stenosis of left middle cerebral artery: Secondary | ICD-10-CM | POA: Diagnosis not present

## 2024-01-07 LAB — GLUCOSE, CAPILLARY
Glucose-Capillary: 153 mg/dL — ABNORMAL HIGH (ref 70–99)
Glucose-Capillary: 114 mg/dL — ABNORMAL HIGH (ref 70–99)
Glucose-Capillary: 184 mg/dL — ABNORMAL HIGH (ref 70–99)
Glucose-Capillary: 74 mg/dL (ref 70–99)

## 2024-01-07 NOTE — Progress Notes (Signed)
 Speech Language Pathology Daily Session Note  Patient Details  Name: Haley Jones MRN: [id number] Date of Birth: [birth year]  Today's Date: 01/07/2024 SLP Individual Time: 1302-1400 SLP Individual Time Calculation (min): 58 min  Short Term Goals: Week 2: SLP Short Term Goal 1 (Week 2): STGs = LTGs d/t ELOS  Skilled Therapeutic Interventions:  Patient was seen in PM to address cognitive re- training. Pt was alert and seated upright in WC upon SLP arrival. She was agreeable for session. SLP addressing functional and mildly complex problem solving this date through scavenger hunt throughout unit and mildly complex budgeting task in hospital gift shop. Pt identified locations in and around the CIR unit with min a. She utilized maps in the hospital with min A to identify predetermined locations. Once in the gift shop, SLP challenged pt in a budgeting task which she completed with sup A. Complexity of task was increased due to success with pt demonstrating fleeting attention warranting multiple repetitions of instructions and redirection to task warranting overall mod to max A. Pt returned to her room at conclusion of session and trained in BE FAST stroke symptoms. Unable to assess recall of information due to time constraints however pt verbalized comprehension. Pt was left upright in [facility] with call button within reach and chair alarm active. SLP to continue POC.  Pain Pain Assessment Pain Scale: 0-10 Pain Score: 0-No pain  Therapy/Group: Individual Therapy  Adela Holter 01/07/2024, 3:47 PM

## 2024-01-07 NOTE — Progress Notes (Addendum)
 PROGRESS NOTE   Subjective/Complaints: No acute complaints.  No events overnight.  Vital stable.   Blood sugars generally well-controlled, low of 83 yesterday evening, high of 244 over the weekend.  No a.m. labs today.  Consistently incontinent of urine.  Discussed with patient at bedside, she states she is generally incontinent of urine but it has been worsened since her stroke.  She has never been worked up for this, never received medication.  She also gets frequent UTIs but can generally tell when they are happening due to dysuria; this has not happened while she has been in the hospital.  Last bowel movement 5-24, large  ROS: Patient denies fever, new vision changes, dizziness, nausea, vomiting, diarrhea,  shortness of breath or chest pain, or mood change. + Right hand tightness-ongoing + Low back pain + Urinary incontinence-worsened since admission  Objective:   No results found. No results for input(s): "WBC", "HGB", "HCT", "PLT" in the last 72 hours.  No results for input(s): "NA", "K", "CL", "CO2", "GLUCOSE", "BUN", "CREATININE", "CALCIUM " in the last 72 hours.   Intake/Output Summary (Last 24 hours) at 01/07/2024 0943 Last data filed at 01/07/2024 0846 Gross per 24 hour  Intake 1480 ml  Output --  Net 1480 ml         Physical Exam: Vital Signs Blood pressure 126/87, pulse 71, temperature (!) 97.5 F (36.4 C), temperature source Oral, resp. rate 18, height 4\' 9"  (1.448 m), weight 70.7 kg, SpO2 99%.   General: No apparent distress, sitting upright in bed. HEENT: Head is normocephalic, atraumatic, MMM Neck: Supple without JVD or lymphadenopathy Heart: Reg rate and rhythm. No murmurs rubs or gallops Chest: CTAB, nonlabored breathing Abdomen: Soft, non-tender, non-distended, bowel sounds positive. Extremities: No clubbing, cyanosis, or edema. Pulses are 2+ Psych: Pt's affect is appropriate. Pt is  cooperative Skin: No apparent skin breakdown, no apparent lesions. Neuro:    Mental Status: AAOx4,  Right sided neglect   Some decreased attention and delay in processing, mild. Speech/Languate: Naming and repetition intact, fluent, follows simple commands., no aphasia noted CRANIAL NERVES: Right facial weakness, mild right homonymous hemianopsia, otherwise intact.  RUE: 4-/5 Deltoid, 4/5 Biceps, 4/5 Triceps,5-/5 Grip LUE: 5/5 Deltoid, 5/5 Biceps, 5/5 Triceps, 5/5 Grip RLE: HF 4-/5, KE 4+/5, ADF 4+/5, APF 4+/5 LLE: HF 5/5, KE 5/5, ADF 5/5, APF 5/5  Tone: MAS 1 right finger flexors, ?  Tightness versus tone shoulder internal rotators.   SENSORY: Normal to touch all 4 extremities   Coordination: Finger-nose altered on the right slightly   MSK: No joint swelling noted Limited range of motion in right shoulder to less than 20 degrees flexion and abduction.  Slightly reduced passive range of motion in abduction to approximately 100 degrees, limited somewhat by guarding and pain.  Patient states is her baseline.  Assessment/Plan: 1. Functional deficits which require 3+ hours per day of interdisciplinary therapy in a comprehensive inpatient rehab setting. Physiatrist is providing close team supervision and 24 hour management of active medical problems listed below. Physiatrist and rehab team continue to assess barriers to discharge/monitor patient progress toward functional and medical goals  Care Tool:  Bathing    Body parts  bathed by patient: Right arm, Left arm, Chest, Abdomen, Front perineal area, Buttocks, Right upper leg, Left upper leg, Right lower leg, Left lower leg, Face         Bathing assist Assist Level: Moderate Assistance - Patient 50 - 74%     Upper Body Dressing/Undressing Upper body dressing   What is the patient wearing?: Pull over shirt    Upper body assist Assist Level: Moderate Assistance - Patient 50 - 74%    Lower Body Dressing/Undressing Lower body  dressing      What is the patient wearing?: Incontinence brief, Pants     Lower body assist Assist for lower body dressing: Moderate Assistance - Patient 50 - 74%     Toileting Toileting    Toileting assist Assist for toileting: Moderate Assistance - Patient 50 - 74%     Transfers Chair/bed transfer  Transfers assist  Chair/bed transfer activity did not occur: Safety/medical concerns  Chair/bed transfer assist level: Moderate Assistance - Patient 50 - 74%     Locomotion Ambulation   Ambulation assist      Assist level: Minimal Assistance - Patient > 75% Assistive device: Walker-rolling Max distance: 80'   Walk 10 feet activity   Assist     Assist level: Minimal Assistance - Patient > 75% Assistive device: Walker-rolling   Walk 50 feet activity   Assist    Assist level: Minimal Assistance - Patient > 75% Assistive device: Walker-rolling    Walk 150 feet activity   Assist    Assist level: Minimal Assistance - Patient > 75% Assistive device: Walker-rolling    Walk 10 feet on uneven surface  activity   Assist     Assist level: Minimal Assistance - Patient > 75% Assistive device: Walker-rolling   Wheelchair     Assist Is the patient using a wheelchair?: No             Wheelchair 50 feet with 2 turns activity    Assist            Wheelchair 150 feet activity     Assist          Blood pressure 126/87, pulse 71, temperature (!) 97.5 F (36.4 C), temperature source Oral, resp. rate 18, height 4\' 9"  (1.448 m), weight 70.7 kg, SpO2 99%.  Medical Problem List and Plan: 1. 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 Motor planning difficulty with RUE per therapies. Limited by inattention. Mod A for transfers and ambulation with a walker.  Mod A cognition/attention. Insight poor - has trouble being open to assitance.   2.  Antithrombotics: -DVT/anticoagulation:  Pharmaceutical: Lovenox              -antiplatelet therapy: Aspirin  81 mg daily and Plavix  75 mg daily x 3 months then Plavix  alone  3. Pain Management: Tylenol  as needed  - 5/17 occasional headache control with Tylenol , continue to monitor  5/19: Pain worse nightly, however patient wishes to continue with repositioning and as needed medications.  No additions at this time.  5-22: ?  Possible underlying carpal tunnel syndrome affecting bilateral hand soreness in the morning--may benefit from outpatient EMG  5/23: Limited pain management options due to extensive allergy list.  Add Voltaren gel to bilateral hands and low back, lidocaine  patch to low back, and aqua thermia as needed for chronic low back pain, localized  4. Mood/Behavior/Sleep: Effexor  37.5 mg daily, Klonopin  1 mg nightly             -antipsychotic agents: N/A             -Pt reports daughter passed away around the time she was admitted, consider neuropsych consult             -She is not interested in any adjustment to antidepressant medications at this time  5. Neuropsych/cognition: This patient is capable of making decisions on her own behalf.   - 5/20: Would benefit from neuropsych consult; recent death of her daughter   96. Skin/Wound Care: Routine skin checks 7. Fluids/Electrolytes/Nutrition: Routine in and outs with follow-up chemistries 8.  Diabetes mellitus.  Hemoglobin A1c 8.4.  Presently on NovoLog  5 units 3 times daily, Semglee  10 units daily.  Check blood sugars AC and at bedtime.  Diabetic teaching.  Prior to admission patient on Trulicity  0.75 mg weekly, Amaryl  2 mg daily, Glucophage 500 mg in the morning and 1000 mg in the evening, Glucotrol XL 10 mg daily.  Monitor with increased activity CBG (last 3)  Recent Labs    01/06/24 1619 01/06/24 2045 01/07/24 0614  GLUCAP 83 110* 153*  Blood  sugars well-controlled on current regimen  9.  AKI on CKD stage III.  Follow-up chemistries -5-19: BUN/creatinine elevated from 17/1 0.19-26/1.39.  P.o. intake seem adequate, continue to encourage and will give 500 cc bolus today.  Repeat labs in AM.  No obvious medication contributions but may need to just adjust venlafaxine  if worsening. 5/20: BUN/creatinine stable.  Discussed with patient, she states she has mostly been drinking sugar-free sodas.  Agreeable to switching to water and drinking 68 to 50 cc cups today .  Repeat labs 5-2 2 AM 5/22: BUN/creatinine remain stable, on chart review baseline creatinine ranges between 1.2 and 1.6.  She is at her current baseline, continue to monitor. 5/23: Creatinine 1.27.  Within baseline, monitor 5/26: will order repeat for AM  10.  Permissive hypertension.  Monitor with increased mobility.  Resume home Norvasc  5 mg daily, lisinopril  10 mg daily as needed    01/07/2024    8:30 AM 01/07/2024    3:56 AM 01/06/2024    8:02 PM  Vitals with BMI  Systolic 126 125 409  Diastolic 87 90 61  Pulse  71 84  5/18 BP intermittently elevated, if continues consider increase Norvasc .  For now we will continue to monitor as do not want to cause hypotension/hypoperfusion 5-19: BP currently in good range, monitor 5/20: BP elevated overnight, resume norvasc  5 mg daily (no BP meds ordered?) 5/21: Increase Norvasc  to 10 mg daily.  Add as needed hydralazine  25 mg every 8 hours as needed for elevated systolic and diastolic blood pressures.  Give a few days for Norvasc  to work. 5-22: Normotensive.  Monitor. Continue home losartan at 5 mg daily.--normotensive  11.  Hyperlipidemia. Continue Crestor   12.  GERD.  continue Protonix   13.  B12 deficiency             - Outpatient B12 injection.  Try to determine when she had this last  14. Chronic constipation             -Reports BM yesterday             -  Miralax  and senokot   -LBM 5/16, patient reports she does not feel  constipated and does not have bowel movement every day.  Continue current regimen for now and monitor   5-20: Last bowel movement 5-16, will schedule Senokot S 2 tabs twice daily.  Last bowel movement 5/24   15.  Right hand spasticity.  Baclofen 5 mg twice daily added yesterday.  - 5-22: Encouraged patient to use squeeze foam with therapies to activate and stretch hand in the morning.  Will avoid further baclofen increases due to CKD.  Continue voltaren gel  5-26: No benefit in tone with baclofen; will DC due to CKD  16. Urinary incontinence. -   Patient not endorsing any symptoms suspicious for UTI--later, therapies endorsed confusion, will get UA with reflex to culture.   - Will get PVRs--May start Myrbetriq if no infection and low.   LOS: 10 days A FACE TO FACE EVALUATION WAS PERFORMED  Bea Lime 01/07/2024, 9:43 AM

## 2024-01-07 NOTE — Progress Notes (Signed)
 Occupational Therapy Session Note  Patient Details  Name: Haley Jones MRN: [id number] Date of Birth: [birth year]  Today's Date: 01/07/2024 OT Individual Time: 6295-2841 OT Individual Time Calculation (min): 72 min    Short Term Goals: Week 2:  OT Short Term Goal 1 (Week 2): STG=LTG d/t ELOS  Skilled Therapeutic Interventions/Progress Updates:    Pt received supine with no c/o pain, agreeable to OT session. She required heavy cueing to sequence transfer to EOB d/t R side inattention, CGA. Patient required increased time for initiation, cuing, rest breaks, and for completion of tasks throughout session. Utilized therapeutic use of self throughout to promote efficiency. From EOB, she had increased difficulty with sitting balance and dressing d/t very poor sustained attention and R inattention. She required mod A to don pants with max cueing for apraxia and attention. Very poor insight into deficits and pt verbose/tangential. She donned a shirt with mod-max cueing but with skilled cueing she required no further physical assist. She transferred to the w/c with min A using the RW. Pt was taken via w/c to the therapy gym for time management. She worked on standing level functional reaching activity with resistive clothespins onto a mirror. She had a VERY difficult time with attention, perseverative on looking in the mirror and unable to find R hand. She required removal of mirror each time to find her R hand with max cueing. She demonstrated improved R reaching, hand prehension/grasp, and shoulder flexion. She then worked on dynamic sitting balance, going from R to L lateral flexion onto elbow. Mod facilitation required to maintain anterior weight shift/trunk flexion with posterior bias and mod-max cueing. She required min-mod A. She then returned to the wc and required mod A to prevent posterior LOB when approaching the w/c. She was returned to her room following. Pt was left sitting up in the  wheelchair with all needs met, chair alarm set, and call bell within reach.    Therapy Documentation Precautions:  Precautions Precautions: Fall, Other (comment) (R neglect and R visual field cut) Recall of Precautions/Restrictions: Impaired Restrictions Weight Bearing Restrictions Per Provider Order: No  Therapy/Group: Individual Therapy  [name] 01/07/2024, 10:17 AM

## 2024-01-07 NOTE — Progress Notes (Signed)
 Physical Therapy Weekly Progress Note  Patient Details  Name: Haley Jones MRN: [id number] Date of Birth: [birth year]  Beginning of progress report period: Dec 29, 2023 End of progress report period: Jan 07, 2024  Today's Date: 01/07/2024 PT Individual Time: 0865-7846 PT Individual Time Calculation (min): 58 min   Patient has met 0 of 0 short term goals. Pt did not have short term goals set secondary to originally expected length of stay. Pt has made slower than expected progress with mobility, largely due to Rt sided inattention and general lack of awareness in to deficits, with poor carryover within and between sessions. Pt performs mobility at minA level overall but lacks safety awareness and poor ability to correct mistakes. Pt will benefit from hands on family ed prior to DC.  Patient continues to demonstrate the following deficits muscle weakness, decreased cardiorespiratoy endurance, motor apraxia, decreased coordination, and decreased motor planning, decreased attention to right, decreased attention, decreased awareness, decreased problem solving, and decreased safety awareness, and decreased sitting balance, decreased standing balance, decreased postural control, hemiplegia, and decreased balance strategies and therefore will continue to benefit from skilled PT intervention to increase functional independence with mobility.  Patient progressing toward long term goals..  Continue plan of care.  PT Short Term Goals Week 1:  PT Short Term Goal 1 (Week 1): STG=LTG due to ELOS Week 2:  PT Short Term Goal 1 (Week 2): STGs = LTGs  Skilled Therapeutic Interventions/Progress Updates:    Pt received seated in WC and agrees to therapy. No complaint of pain. WC transport to gym. Pt stands and ambulates x175' with RW and PT positioned on Rt side, with pt able to complete lap without running into any objects. PT provides cues to attend to Rt side, as well as to increase stride length and  gait speed to decrease risk for falls. Following rest break, pt ambulates same lap with PT positioned on Lt side, with pt routinely running into objects on Rt side, despite cueing to attend to Rt. PT provides minA overall for safety.  Pt ambulates 3rd bout around nursing station, with cue to attempt to not talk during mobility, as pt becomes very easily distracted, especially with talking. Pt does a good job of limiting talking, but still makes contact with several objects in Rt visual field, showing little awareness or attention to Rt side. Pt then attempts ambulating through obstacle course, tasked with ambulating around several bowling pins in a weaving pattern. PT demonstrates desired path for ambulation and then pt attempts, almost immediately veering from desired path and demonstrating very poor carryover of cueing and demonstration. Pt ambulates around 1 out of 8 pins. On 2nd attempt pt ambulates around 3/8 pins, but still with poor carryover of instructions. WC transport back to room. Left seated with alarm intact and all needs within reach    Therapy Documentation Precautions:  Precautions Precautions: Fall, Other (comment) (R neglect and R visual field cut) Recall of Precautions/Restrictions: Impaired Restrictions Weight Bearing Restrictions Per Provider Order: No   Therapy/Group: Individual Therapy  Neva Barban, PT, DPT 01/07/2024, 4:47 PM

## 2024-01-08 ENCOUNTER — Other Ambulatory Visit (HOSPITAL_COMMUNITY): Payer: Self-pay

## 2024-01-08 DIAGNOSIS — I63512 Cerebral infarction due to unspecified occlusion or stenosis of left middle cerebral artery: Secondary | ICD-10-CM | POA: Diagnosis not present

## 2024-01-08 LAB — URINALYSIS, W/ REFLEX TO CULTURE (INFECTION SUSPECTED)
Bilirubin Urine: NEGATIVE
Glucose, UA: NEGATIVE mg/dL
Ketones, ur: NEGATIVE mg/dL
Nitrite: POSITIVE — AB
Protein, ur: NEGATIVE mg/dL
Specific Gravity, Urine: 1.025 (ref 1.005–1.030)
WBC, UA: >50 WBC/hpf (ref 0–5)
pH: 5 (ref 5.0–8.0)

## 2024-01-08 LAB — CBC WITH DIFFERENTIAL/PLATELET
Abs Immature Granulocytes: 0.01 10*3/uL (ref 0.00–0.07)
Basophils Absolute: 0 10*3/uL (ref 0.0–0.1)
Basophils Relative: 0 %
Eosinophils Absolute: 0.3 10*3/uL (ref 0.0–0.5)
Eosinophils Relative: 5 %
HCT: 37.4 % (ref 36.0–46.0)
Hemoglobin: 12 g/dL (ref 12.0–15.0)
Immature Granulocytes: 0 %
Lymphocytes Relative: 24 %
Lymphs Abs: 1.7 10*3/uL (ref 0.7–4.0)
MCH: 31.5 pg (ref 26.0–34.0)
MCHC: 32.1 g/dL (ref 30.0–36.0)
MCV: 98.2 fL (ref 80.0–100.0)
Monocytes Absolute: 0.6 10*3/uL (ref 0.1–1.0)
Monocytes Relative: 8 %
Neutro Abs: 4.3 10*3/uL (ref 1.7–7.7)
Neutrophils Relative %: 63 %
Platelets: 243 10*3/uL (ref 150–400)
RBC: 3.81 MIL/uL — ABNORMAL LOW (ref 3.87–5.11)
RDW: 11.9 % (ref 11.5–15.5)
WBC: 6.9 10*3/uL (ref 4.0–10.5)
nRBC: 0 % (ref 0.0–0.2)

## 2024-01-08 LAB — GLUCOSE, CAPILLARY
Glucose-Capillary: 108 mg/dL — ABNORMAL HIGH (ref 70–99)
Glucose-Capillary: 121 mg/dL — ABNORMAL HIGH (ref 70–99)
Glucose-Capillary: 143 mg/dL — ABNORMAL HIGH (ref 70–99)
Glucose-Capillary: 164 mg/dL — ABNORMAL HIGH (ref 70–99)
Glucose-Capillary: 173 mg/dL — ABNORMAL HIGH (ref 70–99)
Glucose-Capillary: 55 mg/dL — ABNORMAL LOW (ref 70–99)
Glucose-Capillary: 60 mg/dL — ABNORMAL LOW (ref 70–99)

## 2024-01-08 LAB — BASIC METABOLIC PANEL WITH GFR
Anion gap: 11 (ref 5–15)
BUN: 29 mg/dL — ABNORMAL HIGH (ref 8–23)
CO2: 23 mmol/L (ref 22–32)
Calcium: 8.9 mg/dL (ref 8.9–10.3)
Chloride: 104 mmol/L (ref 98–111)
Creatinine, Ser: 1.52 mg/dL — ABNORMAL HIGH (ref 0.44–1.00)
GFR, Estimated: 38 mL/min — ABNORMAL LOW (ref >60)
Glucose, Bld: 162 mg/dL — ABNORMAL HIGH (ref 70–99)
Potassium: 4.6 mmol/L (ref 3.5–5.1)
Sodium: 138 mmol/L (ref 135–145)

## 2024-01-08 MED ORDER — INSULIN ASPART 100 UNIT/ML IJ SOLN
0.0000 [IU] | Freq: Three times a day (TID) | INTRAMUSCULAR | Status: DC
  Administered 2024-01-10 (×2): 1 [IU] via SUBCUTANEOUS
  Administered 2024-01-11: 2 [IU] via SUBCUTANEOUS
  Administered 2024-01-12 – 2024-01-13 (×2): 1 [IU] via SUBCUTANEOUS
  Administered 2024-01-13: 2 [IU] via SUBCUTANEOUS
  Administered 2024-01-13: 0 [IU] via SUBCUTANEOUS
  Administered 2024-01-14: 3 [IU] via SUBCUTANEOUS
  Administered 2024-01-14: 1 [IU] via SUBCUTANEOUS
  Administered 2024-01-15: 5 [IU] via SUBCUTANEOUS
  Administered 2024-01-16 (×3): 2 [IU] via SUBCUTANEOUS
  Administered 2024-01-17: 1 [IU] via SUBCUTANEOUS
  Administered 2024-01-17: 3 [IU] via SUBCUTANEOUS
  Administered 2024-01-17 – 2024-01-18 (×2): 2 [IU] via SUBCUTANEOUS

## 2024-01-08 MED ORDER — SULFAMETHOXAZOLE-TRIMETHOPRIM 800-160 MG PO TABS
1.0000 | ORAL_TABLET | Freq: Two times a day (BID) | ORAL | Status: DC
Start: 2024-01-08 — End: 2024-01-13
  Administered 2024-01-08 – 2024-01-09 (×3): 1 via ORAL
  Filled 2024-01-08 (×3): qty 1

## 2024-01-08 MED ORDER — POLYETHYLENE GLYCOL 3350 17 G PO PACK: 17.0000 g | PACK | Freq: Every day | ORAL | Status: DC

## 2024-01-08 MED ORDER — INSULIN ASPART 100 UNIT/ML IJ SOLN
5.0000 [IU] | Freq: Three times a day (TID) | INTRAMUSCULAR | Status: DC
  Administered 2024-01-09 – 2024-01-12 (×10): 5 [IU] via SUBCUTANEOUS

## 2024-01-08 MED ORDER — GLUCOSE 40 % PO GEL
ORAL | Status: AC
  Administered 2024-01-08: 37.5 g via ORAL
  Filled 2024-01-08: qty 1.21

## 2024-01-08 MED ORDER — ACETAMINOPHEN 500 MG PO TABS
1000.0000 mg | ORAL_TABLET | Freq: Four times a day (QID) | ORAL | 0 refills | Status: AC | PRN
  Filled 2024-01-08: qty 30, 3d supply, fill #0

## 2024-01-08 MED ORDER — GLUCOSE 40 % PO GEL: 1.0000 | Freq: Once | ORAL | Status: AC

## 2024-01-08 NOTE — Plan of Care (Signed)
  Problem: Consults Goal: RH STROKE PATIENT EDUCATION Description: See Patient Education module for education specifics  Outcome: Progressing   Problem: RH BOWEL ELIMINATION Goal: RH STG MANAGE BOWEL WITH ASSISTANCE Description: STG Manage Bowel with Assistance. Outcome: Progressing   Problem: RH BLADDER ELIMINATION Goal: RH STG MANAGE BLADDER WITH ASSISTANCE Description: STG Manage Bladder With supervision Assistance Outcome: Progressing   Problem: RH SKIN INTEGRITY Goal: RH STG SKIN FREE OF INFECTION/BREAKDOWN Description: Manage skin free of infection/breakdown with supervision Outcome: Progressing   Problem: RH SAFETY Goal: RH STG ADHERE TO SAFETY PRECAUTIONS W/ASSISTANCE/DEVICE Description: STG Adhere to Safety Precautions With supervision Assistance/Device. Outcome: Progressing   Problem: RH PAIN MANAGEMENT Goal: RH STG PAIN MANAGED AT OR BELOW PT'S PAIN GOAL Description: <4 w/ prns Outcome: Progressing   Problem: RH KNOWLEDGE DEFICIT Goal: RH STG INCREASE KNOWLEDGE OF DIABETES Description: Manage increase knowledge of diabetes with supervision from sister using educational materials provided Outcome: Progressing Goal: RH STG INCREASE KNOWLEDGE OF HYPERTENSION Description: Manage increase knowledge of hypertension with supervision from sister using educational materials provided Outcome: Progressing Goal: RH STG INCREASE KNOWLEGDE OF HYPERLIPIDEMIA Description: Manage increase knowledge of hyperlipidemia with supervision from sister using educational materials provided Outcome: Progressing Goal: RH STG INCREASE KNOWLEDGE OF STROKE PROPHYLAXIS Description: Manage increase knowledge of stroke prophylaxis  with supervision from sister using educational materials provided Outcome: Progressing   Problem: Consults Goal: RH STROKE PATIENT EDUCATION Description: See Patient Education module for education specifics  Outcome: Progressing

## 2024-01-08 NOTE — Progress Notes (Signed)
 Speech Language Pathology Daily Session Note  Patient Details  Name: Haley Jones MRN: [id number] Date of Birth: 27-Apr-1956  Today's Date: 01/08/2024 SLP Individual Time: 0800-0900 SLP Individual Time Calculation (min): 60 min  Short Term Goals: Week 2: SLP Short Term Goal 1 (Week 2): STGs = LTGs d/t ELOS  Skilled Therapeutic Interventions:   Pt greeted at bedside. She was awake/alert in bed upon SLP arrival. She was pleasant and overall cooperative throughout tx tasks targeting cognition/language. She was able to recall recent info w/ minA for recall of mildly specific details. She then completed a structured visual organization and problem solving task re calendar. She benefited from additional time to complete task d/t verbosity and overall attention deficits. Additionally, she required minA for information processing, attention to details, and visual organization. She required errorless learning for error awareness and intellectual awareness, as she consistently denied that errors like this would occur at home and insisted that her calendar "would not look like this." She also completed word deduction task, benefited from [facility] for word finding and processing. At the end of tx tasks, she was left in bed with the alarm set and call light within reach. Nursing present upon SLP departure as well. Recommend cont ST per POC.   Pain Pain Assessment Pain Scale: Not given for pain  Therapy/Group: Individual Therapy  [name] 01/08/2024, 9:35 AM

## 2024-01-08 NOTE — Progress Notes (Addendum)
 Patient ID: Sherie Dine, female   DOB: 22-Jun-1956, 68 y.o.   MRN: [id number]  1224- SW spoke with pt sister to provide updates from team conference, and d/c date changes from 5/30 to 6/6 now. She will follow-up about family edu next week.   *SW met with pt in room to share on above changes.   SW returned phone call pt sister [name] to discuss fam edu. Fam edu scheduled on Monday 9am-12pm.   Norval Been, MSW, LCSW Office: [phone] Cell: [phone] Fax: [phone]

## 2024-01-08 NOTE — Progress Notes (Signed)
 PROGRESS NOTE   Subjective/Complaints: No acute complaints.  No events overnight.  Patient seen resting in bed, no concerns per nursing. Per therapies, had significant cognitive deficits affecting therapy progress, difficult to redirect and focus on doing things her own way.  Complicated by persistent hemineglect and some motor apraxia.  Patient hesitant and reluctant to give up driving.  Vital stable.  Urinalysis from yesterday positive, however culture with too many different bacteria to discriminate; suggested repeat.  ROS: Patient denies fever, new vision changes, dizziness, nausea, vomiting, diarrhea,  shortness of breath or chest pain, or mood change. + Right hand tightness-ongoing + Low back pain + Urinary incontinence-ongoing  Objective:   No results found. Recent Labs    01/08/24 1306  WBC 6.9  HGB 12.0  HCT 37.4  PLT 243    Recent Labs    01/08/24 1306  NA 138  K 4.6  CL 104  CO2 23  GLUCOSE 162*  BUN 29*  CREATININE 1.52*  CALCIUM  8.9     Intake/Output Summary (Last 24 hours) at 01/08/2024 2357 Last data filed at 01/08/2024 1806 Gross per 24 hour  Intake 1180 ml  Output --  Net 1180 ml         Physical Exam: Vital Signs Blood pressure (!) 105/58, pulse 84, temperature 97.6 F (36.4 C), resp. rate 18, height 4\' 9"  (1.448 m), weight 70.7 kg, SpO2 97%.   General: No apparent distress, laying in bed. HEENT: Head is normocephalic, atraumatic, MMM Neck: Supple without JVD or lymphadenopathy Heart: Reg rate and rhythm. No murmurs rubs or gallops Chest: CTAB, nonlabored breathing Abdomen: Soft, non-tender, non-distended, bowel sounds positive. Extremities: No clubbing, cyanosis, or edema. Pulses are 2+ Psych: Pt's affect is appropriate. Pt is cooperative Skin: No apparent skin breakdown, no apparent lesions. Neuro:    Mental Status: AAOx4,  + Right-sided hemineglect, stable Some decreased  attention and delay in processing, mild.--Ongoing Speech/Languate: Naming and repetition intact, fluent, follows simple commands., no aphasia noted CRANIAL NERVES: Right facial weakness, mild right homonymous hemianopsia, otherwise intact.  RUE: 4-/5 Deltoid, 4/5 Biceps, 4/5 Triceps,5-/5 Grip LUE: 5/5 Deltoid, 5/5 Biceps, 5/5 Triceps, 5/5 Grip RLE: HF 4-/5, KE 4+/5, ADF 4+/5, APF 4+/5 LLE: HF 5/5, KE 5/5, ADF 5/5, APF 5/5  Tone: MAS 1 right finger flexors, otherwise none appreciable   SENSORY: Normal to touch all 4 extremities   Coordination: Finger-nose altered on the right slightly   MSK: No joint swelling noted Limited range of motion in right shoulder to less than 20 degrees flexion and abduction.  Slightly reduced passive range of motion in abduction to approximately 100 degrees--Per therapies, does better with sessions, may be motor apraxia  Assessment/Plan: 1. Functional deficits which require 3+ hours per day of interdisciplinary therapy in a comprehensive inpatient rehab setting. Physiatrist is providing close team supervision and 24 hour management of active medical problems listed below. Physiatrist and rehab team continue to assess barriers to discharge/monitor patient progress toward functional and medical goals  Care Tool:  Bathing    Body parts bathed by patient: Right arm, Left arm, Chest, Abdomen, Front perineal area, Buttocks, Right upper leg, Left upper leg, Right lower leg,  Left lower leg, Face         Bathing assist Assist Level: Moderate Assistance - Patient 50 - 74%     Upper Body Dressing/Undressing Upper body dressing   What is the patient wearing?: Pull over shirt    Upper body assist Assist Level: Contact Guard/Touching assist    Lower Body Dressing/Undressing Lower body dressing      What is the patient wearing?: Incontinence brief, Pants     Lower body assist Assist for lower body dressing: Moderate Assistance - Patient 50 - 74%      Toileting Toileting    Toileting assist Assist for toileting: Moderate Assistance - Patient 50 - 74%     Transfers Chair/bed transfer  Transfers assist  Chair/bed transfer activity did not occur: Safety/medical concerns  Chair/bed transfer assist level: Minimal Assistance - Patient > 75%     Locomotion Ambulation   Ambulation assist      Assist level: Minimal Assistance - Patient > 75% Assistive device: Walker-rolling Max distance: 175'   Walk 10 feet activity   Assist     Assist level: Minimal Assistance - Patient > 75% Assistive device: Walker-rolling   Walk 50 feet activity   Assist    Assist level: Minimal Assistance - Patient > 75% Assistive device: Walker-rolling    Walk 150 feet activity   Assist    Assist level: Minimal Assistance - Patient > 75% Assistive device: Walker-rolling    Walk 10 feet on uneven surface  activity   Assist     Assist level: Minimal Assistance - Patient > 75% Assistive device: Walker-rolling   Wheelchair     Assist Is the patient using a wheelchair?: No             Wheelchair 50 feet with 2 turns activity    Assist            Wheelchair 150 feet activity     Assist          Blood pressure (!) 105/58, pulse 84, temperature 97.6 F (36.4 C), resp. rate 18, height 4\' 9"  (1.448 m), weight 70.7 kg, SpO2 97%.  Medical Problem List and Plan: 1. 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 Motor planning difficulty with RUE per therapies. Limited by inattention. Mod A for transfers and ambulation with a walker. Mod A cognition/attention. Insight poor - has trouble being open to assitance.   2.  Antithrombotics: -DVT/anticoagulation:  Pharmaceutical: Lovenox              -antiplatelet therapy: Aspirin  81 mg  daily and Plavix  75 mg daily x 3 months then Plavix  alone  3. Pain Management: Tylenol  as needed  - 5/17 occasional headache control with Tylenol , continue to monitor  5/19: Pain worse nightly, however patient wishes to continue with repositioning and as needed medications.  No additions at this time.  5-22: ?  Possible underlying carpal tunnel syndrome affecting bilateral hand soreness in the morning--may benefit from outpatient EMG  5/23: Limited pain management options due to extensive allergy list.  Add Voltaren gel to bilateral hands and low back, lidocaine  patch to low back, and aqua thermia as needed  for chronic low back pain, localized  4. Mood/Behavior/Sleep: Effexor  37.5 mg daily, Klonopin  1 mg nightly             -antipsychotic agents: N/A             -Pt reports daughter passed away around the time she was admitted, consider neuropsych consult             -She is not interested in any adjustment to antidepressant medications at this time  5. Neuropsych/cognition: This patient is capable of making decisions on her own behalf.   - 5/20: Would benefit from neuropsych consult; recent death of her daughter --completed 5-23  - 5-27: Therapies reporting patient talking about online boyfriend with some features concerning for catfishing/scam; patient's sister informed  6. Skin/Wound Care: Routine skin checks 7. Fluids/Electrolytes/Nutrition: Routine in and outs with follow-up chemistries 8.  Diabetes mellitus.  Hemoglobin A1c 8.4.  Presently on NovoLog  5 units 3 times daily, Semglee  10 units daily.  Check blood sugars AC and at bedtime.  Diabetic teaching.  Prior to admission patient on Trulicity  0.75 mg weekly, Amaryl  2 mg daily, Glucophage 500 mg in the morning and 1000 mg in the evening, Glucotrol XL 10 mg daily.  Monitor with increased activity CBG (last 3)  Recent Labs    01/08/24 1153 01/08/24 1634 01/08/24 2050  GLUCAP 164* 108* 121*  Blood sugars well-controlled on current  regimen  9.  AKI on CKD stage III.  Follow-up chemistries -5-19: BUN/creatinine elevated from 17/1 0.19-26/1.39.  P.o. intake seem adequate, continue to encourage and will give 500 cc bolus today.  Repeat labs in AM.  No obvious medication contributions but may need to just adjust venlafaxine  if worsening. 5/20: BUN/creatinine stable.  Discussed with patient, she states she has mostly been drinking sugar-free sodas.  Agreeable to switching to water and drinking 68 to 50 cc cups today .  Repeat labs 5-2 2 AM 5/22: BUN/creatinine remain stable, on chart review baseline creatinine ranges between 1.2 and 1.6.  She is at her current baseline, continue to monitor. 5/23: Creatinine 1.27.  Within baseline, monitor 5/27: Creatinine 1.57, increased in the setting of UTI.  Will start treatment as below and DC losartan.  Repeat in 2 days.  10.  Permissive hypertension.  Monitor with increased mobility.  Resume home Norvasc  5 mg daily, lisinopril  10 mg daily as needed    01/08/2024    8:01 PM 01/08/2024    2:45 PM 01/08/2024    8:53 AM  Vitals with BMI  Systolic 105 94 106  Diastolic 58 59 83  Pulse 84 77   5/18 BP intermittently elevated, if continues consider increase Norvasc .  For now we will continue to monitor as do not want to cause hypotension/hypoperfusion 5-19: BP currently in good range, monitor 5/20: BP elevated overnight, resume norvasc  5 mg daily (no BP meds ordered?) 5/21: Increase Norvasc  to 10 mg daily.  Add as needed hydralazine  25 mg every 8 hours as needed for elevated systolic and diastolic blood pressures.  Give a few days for Norvasc  to work. 5-22: Normotensive.  Monitor. 5-27: Somewhat hypotensive this afternoon, will DC losartan and may need IV fluids tomorrow if no improvement with infection treatment  11.  Hyperlipidemia. Continue Crestor   12.  GERD.  continue Protonix   13.  B12 deficiency             - Outpatient B12 injection.  Try to determine when she had this last  14.  Chronic  constipation             -Reports BM yesterday             -Miralax  and senokot   -LBM 5/16, patient reports she does not feel constipated and does not have bowel movement every day.  Continue current regimen for now and monitor   5-20: Last bowel movement 5-16, will schedule Senokot S 2 tabs twice daily.  Last bowel movement 5/24--may need sorbitol  if no bowel movement today 5-27   15.  Right hand spasticity.  Baclofen  5 mg twice daily added yesterday.  - 5-22: Encouraged patient to use squeeze foam with therapies to activate and stretch hand in the morning.  Will avoid further baclofen  increases due to CKD.  Continue voltaren  gel  5-26: No benefit in tone with baclofen ; will DC due to CKD  16. Urinary incontinence. -   Patient not endorsing any symptoms suspicious for UTI--later, therapies endorsed confusion, will get UA with reflex to culture.   - Will get PVRs--May start Myrbetriq if no infection and low. - 5-27: Urinalysis positive for UTI, start p.o. Bactrim  800 mg twice daily for 5 days.  Repeat culture as prior had too many bacteria.  LOS: 11 days A FACE TO FACE EVALUATION WAS PERFORMED  Bea Lime 01/08/2024, 11:57 PM

## 2024-01-08 NOTE — Inpatient Diabetes Management (Signed)
 Inpatient Diabetes Program Recommendations  AACE/ADA: New Consensus Statement on Inpatient Glycemic Control (2015)  Target Ranges:  Prepandial:   less than 140 mg/dL      Peak postprandial:   less than 180 mg/dL (1-2 hours)      Critically ill patients:  140 - 180 mg/dL   Lab Results  Component Value Date   GLUCAP 164 (H) 01/08/2024   HGBA1C 6.4 (H) 01/29/2019    Latest Reference Range & Units 01/07/24 16:08 01/07/24 20:42 01/08/24 00:32 01/08/24 06:15 01/08/24 11:22 01/08/24 11:24 01/08/24 11:53  Glucose-Capillary 70 - 99 mg/dL 161 (H) Novolog  10 units 74 173 (H) 143 (H) Novolog  9 units 55 (L) 60 (L) 164 (H) Novolog  7 units  (H): Data is abnormally high (L): Data is abnormally low  Current orders for Inpatient glycemic control: Semglee  10 units daily, Novolog  7 units tid meal coverage, Novolog  0-15 units tid correction  Inpatient Diabetes Program Recommendations:    Noted patient had hypoglycemia post Novolog  correction + meal coverage. Please consider: -Decrease Novolog  correction to 0-9 units tid  Thank you, Maribella Kuna E. Torie Towle, RN, MSN, CDCES  Diabetes Coordinator Inpatient Glycemic Control Team Team Pager [phone] (8am-5pm) 01/08/2024 2:20 PM'

## 2024-01-08 NOTE — Plan of Care (Signed)
 Goals downgraded 2/2 severity of R inattention  Problem: RH Balance Goal: LTG: Patient will maintain dynamic sitting balance (OT) Description: LTG:  Patient will maintain dynamic sitting balance with assistance during activities of daily living (OT) Flowsheets (Taken 01/08/2024 0818) LTG: Pt will maintain dynamic sitting balance during ADLs with: (downgraded 2/2 severity of R inattention- SD) Supervision/Verbal cueing Goal: LTG Patient will maintain dynamic standing with ADLs (OT) Description: LTG:  Patient will maintain dynamic standing balance with assist during activities of daily living (OT)  Flowsheets (Taken 01/08/2024 0818) LTG: Pt will maintain dynamic standing balance during ADLs with: (downgraded 2/2 severity of R inattention- SD) Contact Guard/Touching assist   Problem: RH Functional Use of Upper Extremity Goal: LTG Patient will use RT/LT upper extremity as a (OT) Description: LTG: Patient will use right/left upper extremity as a stabilizer/gross assist/diminished/nondominant/dominant level with assist, with/without cues during functional activity (OT) Flowsheets (Taken 01/08/2024 0818) LTG: Use of upper extremity in functional activities: RUE as gross assist level LTG: Pt will use upper extremity in functional activity with assistance level of: (downgraded 2/2 severity of R inattention- SD) Supervision/Verbal cueing   Problem: RH Simple Meal Prep Goal: LTG Patient will perform simple meal prep w/assist (OT) Description: LTG: Patient will perform simple meal prep with assistance, with/without cues (OT). Outcome: Not Applicable   Problem: RH Light Housekeeping Goal: LTG Patient will perform light housekeeping w/assist (OT) Description: LTG: Patient will perform light housekeeping with assistance, with/without cues (OT). Outcome: Not Applicable   Problem: RH Toilet Transfers Goal: LTG Patient will perform toilet transfers w/assist (OT) Description: LTG: Patient will perform  toilet transfers with assist, with/without cues using equipment (OT) Flowsheets (Taken 01/08/2024 0818) LTG: Pt will perform toilet transfers with assistance level of: (downgraded 2/2 severity of R inattention- SD) Contact Guard/Touching assist   Problem: RH Tub/Shower Transfers Goal: LTG Patient will perform tub/shower transfers w/assist (OT) Description: LTG: Patient will perform tub/shower transfers with assist, with/without cues using equipment (OT) Flowsheets (Taken 01/08/2024 0818) LTG: Pt will perform tub/shower stall transfers with assistance level of: (downgraded 2/2 severity of R inattention- SD) Contact Guard/Touching assist   Problem: RH Attention Goal: LTG Patient will demonstrate this level of attention during functional activites (OT) Description: LTG:  Patient will demonstrate this level of attention during functional activites  (OT) Flowsheets (Taken 01/08/2024 0818) Patient will demonstrate this level of attention during functional activites: Sustained LTG: Patient will demonstrate this level of attention during functional activites (OT): (downgraded 2/2 severity of R inattention- SD) Minimal Assistance - Patient > 75%

## 2024-01-08 NOTE — Patient Care Conference (Signed)
 Inpatient RehabilitationTeam Conference and Plan of Care Update Date: 01/08/2024   Time: 1020 am    Patient Name: Haley Jones      Medical Record Number: [id number]  Date of Birth: Mar 18, 1956 Sex: Female         Room/Bed: 4W19C/4W19C-01 Payor Info: Payor: [employer] MEDICARE / Plan: [employer] MEDICARE CHOICE PPO / Product Type: *No Product type* /    Admit Date/Time:  12/28/2023  3:16 PM  Primary Diagnosis:  Left middle cerebral artery stroke [facility])  Hospital Problems: Principal Problem:   Left middle cerebral artery stroke [facility]) Active Problems:   Depression with anxiety    Expected Discharge Date: Expected Discharge Date: 01/18/24  Team Members Present: Physician leading conference: Dr. Cherri Corns Social Worker Present: Norval Been, LCSW Nurse Present: Jerene Monks, RN PT Present: Theodis Fiscal, PT OT Present: Eilene Grater, OT SLP Present: Tally Faes, SLP PPS Coordinator present : Jestine Moron, SLP     Current Status/Progress Goal Weekly Team Focus  Bowel/Bladder   incontient for both ,sheel feel  after she wet her self after she ask for change   avoid bladder retention or UTI , constipation   obs BM, and bladder scam if any sign for retention, obs BM    Swallow/Nutrition/ Hydration               ADL's   UB/LB bathing at min A, apraxia limits dressing- mod A still for LB- with max skilled cueing CGA-min A UB dressing. RUE neglect. Very poor insight/awareness   Supervision to mod I   RUE NMR, ADLs, transfers, balance, apraxia    Mobility   minA bed mobility, minA transfers, minA ambulation x175' with RW   mod(I) transfers, supervision ambulation  Rt sided attention, balance, awareness, ambulation, transfers    Communication                Safety/Cognition/ Behavioral Observations  moderate cognitive-linguistic deficits remain: attention, memory, problem solving, word finding/severe awareness deficits   min-modA   pt/family education,  functional cognitive tasks, cognitive re training, continued compensatory strategy training    Pain                Skin   FREE wound   free skin issures  skin care , positioning , avoid bed ulcer      Discharge Planning:  Pt will d/c to home with support from her sister assisting, and possible support from a friend. SW will confirm there are no barriers to discharge.    Team Discussion: Patient was admitted post left MCA infarction/occlusion. Patient with pain/hyperglycemia: medications adjusted by MD.  Patient limited by apraxia, right inattention, very poor insight/ awareness ,mild-mod cognitive deficits and resistance for help at home.   Patient on target to meet rehab goals: yes, currently patient requires minimal assistance with ADLs and  transfers. Patient is able to ambulate 56' with minimal assistance up to 175' using a rolling walker. Overall goals at discharge are set for supervision- mod I assistance. Overall goals at discharge are set for supervision- mod I assistance.  *See Care Plan and progress notes for long and short-term goals.   Revisions to Treatment Plan:  Neuro psych consult  Teaching Needs: Safety, medications, dietary modifications, toileting, diabetic teaching, transfers, etc    Current Barriers to Discharge: Decreased caregiver support, Home enviroment access/layout, and Incontinence  Possible Resolutions to Barriers: Family education DME: ramp/hospital bed     Medical Summary Current Status: medically complicated by CKD, UTI with  incontinence, hyperglycemia, constipation, behavior/mood, hypertension, low back and hand pain, and inattention  Barriers to Discharge: Behavior/Mood;Complicated Wound;Medical stability;Self-care education;Uncontrolled Hypertension;Uncontrolled Diabetes;Renal Insufficiency/Failure;Uncontrolled Pain   Possible Resolutions to [employer]: titrate pain medications, monitor labs and vitals, treat UTI with  antibiotics and PVRs/timed toiletting for incontinence, encourage PO fluids and adjust meds for BP/AKI, adjust insulin  for hyperglycemia   Continued Need for Acute Rehabilitation Level of Care: The patient requires daily medical management by a physician with specialized training in physical medicine and rehabilitation for the following reasons: Direction of a multidisciplinary physical rehabilitation program to maximize functional independence : Yes Medical management of patient stability for increased activity during participation in an intensive rehabilitation regime.: Yes Analysis of laboratory values and/or radiology reports with any subsequent need for medication adjustment and/or medical intervention. : Yes   I attest that I was present, lead the team conference, and concur with the assessment and plan of the team.   Shawnta Schlegel Gayo 01/08/2024, 1020 am

## 2024-01-08 NOTE — Plan of Care (Signed)
  Problem: RH Problem Solving Goal: LTG Patient will demonstrate problem solving for (SLP) Description: LTG:  Patient will demonstrate problem solving for basic/complex daily situations with cues  (SLP) Flowsheets (Taken 01/08/2024 0926) LTG: Patient will demonstrate problem solving for (SLP): Basic daily situations LTG Patient will demonstrate problem solving for: Moderate Assistance - Patient 50 - 74% Note: Basic and mildly complex problem solving   Problem: RH Memory Goal: LTG Patient will use memory compensatory aids to (SLP) Description: LTG:  Patient will use memory compensatory aids to recall biographical/new, daily complex information with cues (SLP) Flowsheets (Taken 01/08/2024 0926) LTG: Patient will use memory compensatory aids to (SLP): Minimal Assistance - Patient > 75%   Problem: RH Attention Goal: LTG Patient will demonstrate this level of attention during functional activites (SLP) Description: LTG:  Patient will will demonstrate this level of attention during functional activites (SLP) Flowsheets (Taken 01/08/2024 [phone]) Patient will demonstrate during cognitive/linguistic activities the attention type of: Sustained Patient will demonstrate this level of attention during cognitive/linguistic activities in: Controlled LTG: Patient will demonstrate this level of attention during cognitive/linguistic activities with assistance of (SLP): Minimal Assistance - Patient > 75% Number of minutes patient will demonstrate attention during cognitive/linguistic activities: 10   Problem: RH Awareness Goal: LTG: Patient will demonstrate awareness during functional activites type of (SLP) Description: LTG: Patient will demonstrate awareness during functional activites type of (SLP) Flowsheets (Taken 01/08/2024 [phone]) Patient will demonstrate during cognitive/linguistic activities awareness type of: Intellectual LTG: Patient will demonstrate awareness during cognitive/linguistic activities with  assistance of (SLP): Maximal Assistance - Patient 25 - 49%  Goals downgraded d/t progress thus far

## 2024-01-08 NOTE — Progress Notes (Signed)
 Occupational Therapy Session Note  Patient Details  Name: Haley Jones MRN: [id number] Date of Birth: 02-Sep-1955  Today's Date: 01/08/2024 OT Individual Time: 8413-2440 OT Individual Time Calculation (min): 30 min  and Today's Date: 01/08/2024 OT Missed Time: 15 Minutes Missed Time Reason: Nursing care   Short Term Goals: Week 2:  OT Short Term Goal 1 (Week 2): STG=LTG d/t ELOS  Skilled Therapeutic Interventions/Progress Updates:    Pt received supine with no c/o pain, agreeable to OT session. Attempted to encourage her to take a shower but she declined. She reported feeling sad and stating "I just feel like I want to cry", in re to processing the death of her daughter. Provided emotional support and encouragement in the grieving process.Initiated transfer to EOB and nursing staff entered room to get clean catch I/o cath for urine sample. Re-entered after 15 min. She completed bed mobility to EOB with MAX cueing for R attention and sequencing. She was internally distracted and verbose. She required min A to come to EOB. Once EOB she had frequent, gradual LOB backward. She required min A overall for static sitting balance. Sit >stand with min A and short distance ambulatory transfer to the w/c. She completed oral care at the sink with good initiation of the RUE inclusion into task. She required min facilitation for hand prehension and to squeeze toothpaste. She brushed hair and washed face with (S). She was taken to the therapy gym with total A for time management. She completed RUE functional reaching in standing at the window with mod facilitation/cueing for reducing compensation at the upper traps and at the elbow to achieve flexion to 80 degrees. She had much improved gross grasp and was able to pull squigz off the window with no further facilitation. She returned to her room following. Pt was left sitting up in the wheelchair with all needs met, chair alarm set, and call bell within reach.    Therapy Documentation Precautions:  Precautions Precautions: Fall (R inattention) Recall of Precautions/Restrictions: Impaired Restrictions Weight Bearing Restrictions Per Provider Order: No  Therapy/Group: Individual Therapy  Haley Jones 01/08/2024, 10:55 AM

## 2024-01-08 NOTE — Progress Notes (Signed)
 Hypoglycemic Event  CBG: 60   Treatment: 4 oz juice/soda and 1 tube glucose gel  Symptoms: None  Follow-up CBG: Time:1154  CBG Result:164   Possible Reasons for Event: Unknown  Comments/MD notified:Yes D. [place], [place]    Haley Jones

## 2024-01-08 NOTE — Progress Notes (Signed)
 Physical Therapy Session Note  Patient Details  Name: Haley Jones MRN: [id number] Date of Birth: Aug 29, 1955  Today's Date: 01/08/2024 PT Individual Time: 1132-1200 PT Individual Time Calculation (min): 28 min   Today's Date: 01/08/2024 PT Individual Time: 9563-8756 PT Individual Time Calculation (min): 55 min   Short Term Goals: Week 2:  PT Short Term Goal 1 (Week 2): STGs = LTGs  Skilled Therapeutic Interventions/Progress Updates:     1st Session: Pt received seated in [facility] and agrees to therapy. Reports pain in groin area and attributes it to UTI. PT provides rest breaks as needed to manage pain. WC transport to gym. Pt performs stand step transfer to Nustep with minA and no AD, Pt completes Nustep for reciprocal coordination training and challenging Rt sided attention. Pt requires frequent redirection to task as she becomes very easily distracted, and occasionally forgets how to operate Nustep correctly, requiring cues for correct performance. PT also provides cues for hand and foot placement and completing full available ROM. Pt completes x10:00 at workload of 4 with average steps per minute ~45. Stand pivot to [facility] with minA and cues for sequencing and positioning. Left seated with alarm intact and all needs within reach.   2nd Session: Pt received seated in [facility] and agrees to therapy. No complaint of pain. WC transport to gym. Pt stands with minA, then ambulates x175' with modA/maxA and consistent cues to not reach for objects with LUE and to maintain forward momentum to improve balance. Pt is hyperverbal throughout bout of ambulation and has difficulty incorporating cues to improve safety and gait pattern.   Pt performs NMR for Rt sided attention, coordination, and balance in parallel bars with mirror for visual feedback. Pt performs left and right sidestepping with task of stepping onto and over 4" steps. PT provides step by step cueing, which pt responds well to. Pt completes with  minA overall, demonstrating improvement in attention and focus with increased consistency of cueing. Following rest break, pt performs same activity with similar results, with CGA provided overall but again with maximal cueing for step by step instructions. Following rest break, pt attempts same activity with minimal cues provided, and has difficulty with sequencing and safety, as well as carryover from instructions during earlier attempts. WC transport back to room. Stand pivot to bed with CGA and cues for positioning. Left supine with alarm intact and all needs within reach.    Therapy Documentation Precautions:  Precautions Precautions: Fall (R inattention) Recall of Precautions/Restrictions: Impaired Restrictions Weight Bearing Restrictions Per Provider Order: No   Therapy/Group: Individual Therapy  Neva Barban, PT, DPT 01/08/2024, 5:14 PM

## 2024-01-09 DIAGNOSIS — I63512 Cerebral infarction due to unspecified occlusion or stenosis of left middle cerebral artery: Secondary | ICD-10-CM | POA: Diagnosis not present

## 2024-01-09 LAB — GLUCOSE, CAPILLARY
Glucose-Capillary: 102 mg/dL — ABNORMAL HIGH (ref 70–99)
Glucose-Capillary: 107 mg/dL — ABNORMAL HIGH (ref 70–99)
Glucose-Capillary: 101 mg/dL — ABNORMAL HIGH (ref 70–99)
Glucose-Capillary: 118 mg/dL — ABNORMAL HIGH (ref 70–99)

## 2024-01-09 MED ORDER — LACTATED RINGERS IV BOLUS
1000.0000 mL | Freq: Once | INTRAVENOUS | Status: AC
  Administered 2024-01-09: 1000 mL via INTRAVENOUS

## 2024-01-09 MED ORDER — SODIUM CHLORIDE 0.9 % IV SOLN
1.0000 g | Freq: Every day | INTRAVENOUS | Status: DC
  Administered 2024-01-09 – 2024-01-10 (×2): 1 g via INTRAVENOUS
  Filled 2024-01-09 (×3): qty 10

## 2024-01-09 MED ORDER — FLUTICASONE PROPIONATE 50 MCG/ACT NA SUSP
1.0000 | Freq: Every day | NASAL | Status: AC
  Administered 2024-01-09 – 2024-01-13 (×5): 1 via NASAL
  Filled 2024-01-09: qty 16

## 2024-01-09 NOTE — Progress Notes (Addendum)
 Speech Language Pathology Daily Session Note  Patient Details  Name: Callen Zuba MRN: [id number] Date of Birth: [birth year]  Today's Date: 01/09/2024 SLP Individual Time: 0800-0900 SLP Individual Time Calculation (min): 60 min   Today's Date: 01/09/2024 SLP Individual Time: 1330-1400 SLP Individual Time Calculation (min): 30 min  Short Term Goals: Week 2: SLP Short Term Goal 1 (Week 2): STGs = LTGs d/t ELOS  Skilled Therapeutic Interventions:   0800- 0900: Pt greeted at bedside. She was awake in bed, receiving morning meds from nursing. Overall pleasant and cooperative throughout tx tasks targeting cognition and language. SLP facilitated tx task targeting visual organization, attention, and decoding. She required maxA overall and additional time to complete task d/t disorganized thinking patterns, visual inattention, and sustained attention deficits. Noted to become frustrated with the task towards the end, anticipate d/t continued errors. Despite requiring extensive time to complete task, she demonstrated slightly improved intellectual awareness in conversation after. She was able to recognize that before her stroke she would've enjoyed a task like this and wouldn't have made as many errors. She was left in bed with the alarm set and call light within reach. Recommend cont ST per POC.   1330-1400: Pt greeted in her room. She was awake/alert in her wheelchair upon SLP arrival and pleasant throughout tx tasks targeting cognition and language. She initially completed a written task targeting category ID and visual organization. She was able to complete task at supervisionA level overall for specific word finding. Initiated menu task and she required modA for organization and processing thus far. Will continue task in upcoming tx sessions. At the end of tx tasks, she was left in her chair with the alarm set and call light within reach. Recommend cont ST per POC.   Pain Pain  Assessment Pain Scale: 0-10 Pain Score: 8  Pain Location: Back  Therapy/Group: Individual Therapy  [name] 01/09/2024, 8:52 AM

## 2024-01-09 NOTE — Progress Notes (Signed)
 PROGRESS NOTE   Subjective/Complaints:  No acute complaints.  No events overnight.  Patient does complain of continuing to be in the hospital, wanting to go home.  He also complains of poor sleep overall, even on her home medication; states that melatonin has given her night mares in the past.  P.o. intakes minimal over the last 2 days; labs yesterday afternoon significant for elevated creatinine 1.5.  Blood pressure soft this a.m., 100s over 50s.  Vitals are stable.  Remains afebrile.  Planing of 8 out of 10 back pain overnight.  Blood sugars with some lows yesterday, looking better this a.m.  Repeat urine culture pending  ROS: Patient denies fever, new vision changes, dizziness, nausea, vomiting, diarrhea,  shortness of breath or chest pain, or mood change. + Right hand tightness-ongoing + Low back pain--ongoing + Urinary incontinence-ongoing.  Denies any lower abdominal pain, dysuria  Objective:   No results found. Recent Labs    01/08/24 1306  WBC 6.9  HGB 12.0  HCT 37.4  PLT 243    Recent Labs    01/08/24 1306  NA 138  K 4.6  CL 104  CO2 23  GLUCOSE 162*  BUN 29*  CREATININE 1.52*  CALCIUM  8.9     Intake/Output Summary (Last 24 hours) at 01/09/2024 0830 Last data filed at 01/09/2024 0800 Gross per 24 hour  Intake 700 ml  Output --  Net 700 ml         Physical Exam: Vital Signs Blood pressure (!) 96/46, pulse 76, temperature 97.6 F (36.4 C), resp. rate 16, height 4\' 9"  (1.448 m), weight 70.7 kg, SpO2 99%.   General: No apparent distress, reclining in bed. HEENT: Head is normocephalic, atraumatic, MMM.  Mild exotropia.  Wearing glasses. Neck: Supple without JVD or lymphadenopathy Heart: Reg rate and rhythm. No murmurs rubs or gallops Chest: CTAB, nonlabored breathing Abdomen: Soft, non-tender, non-distended, bowel sounds positive. Extremities: No clubbing, cyanosis, or edema. Pulses are  2+ Psych: Pt's affect is appropriate. Pt is cooperative.  Slightly elevated mood. Skin: No apparent skin breakdown, no apparent lesions. Neuro:    Mental Status: AAOx4,  + Right-sided hemineglect, --ongoing, moderate Some decreased attention and delay in processing, mild.--Ongoing Speech/Languate: Naming and repetition intact, fluent, follows simple commands., no aphasia noted CRANIAL NERVES: Right facial weakness, mild right homonymous hemianopsia, otherwise intact.  RUE: 4-/5 Deltoid, 4/5 Biceps, 4/5 Triceps,5-/5 Grip -Right shoulder abduction limited to 10 degrees initially, up to 80 degrees if given extended time, questionable apraxia LUE: 5/5 Deltoid, 5/5 Biceps, 5/5 Triceps, 5/5 Grip RLE: HF 4-/5, KE 4+/5, ADF 4+/5, APF 4+/5 LLE: HF 5/5, KE 5/5, ADF 5/5, APF 5/5  Tone: MAS 1 right finger flexors, otherwise none appreciable   SENSORY: Normal to touch all 4 extremities   Coordination: Right finger-nose altered on the right slightly   MSK: No joint swelling noted   Assessment/Plan: 1. Functional deficits which require 3+ hours per day of interdisciplinary therapy in a comprehensive inpatient rehab setting. Physiatrist is providing close team supervision and 24 hour management of active medical problems listed below. Physiatrist and rehab team continue to assess barriers to discharge/monitor patient progress toward functional and medical  goals  Care Tool:  Bathing    Body parts bathed by patient: Right arm, Left arm, Chest, Abdomen, Front perineal area, Buttocks, Right upper leg, Left upper leg, Right lower leg, Left lower leg, Face         Bathing assist Assist Level: Moderate Assistance - Patient 50 - 74%     Upper Body Dressing/Undressing Upper body dressing   What is the patient wearing?: Pull over shirt    Upper body assist Assist Level: Contact Guard/Touching assist    Lower Body Dressing/Undressing Lower body dressing      What is the patient wearing?:  Incontinence brief, Pants     Lower body assist Assist for lower body dressing: Moderate Assistance - Patient 50 - 74%     Toileting Toileting    Toileting assist Assist for toileting: Moderate Assistance - Patient 50 - 74%     Transfers Chair/bed transfer  Transfers assist  Chair/bed transfer activity did not occur: Safety/medical concerns  Chair/bed transfer assist level: Minimal Assistance - Patient > 75%     Locomotion Ambulation   Ambulation assist      Assist level: Minimal Assistance - Patient > 75% Assistive device: Walker-rolling Max distance: 175'   Walk 10 feet activity   Assist     Assist level: Minimal Assistance - Patient > 75% Assistive device: Walker-rolling   Walk 50 feet activity   Assist    Assist level: Minimal Assistance - Patient > 75% Assistive device: Walker-rolling    Walk 150 feet activity   Assist    Assist level: Minimal Assistance - Patient > 75% Assistive device: Walker-rolling    Walk 10 feet on uneven surface  activity   Assist     Assist level: Minimal Assistance - Patient > 75% Assistive device: Walker-rolling   Wheelchair     Assist Is the patient using a wheelchair?: No             Wheelchair 50 feet with 2 turns activity    Assist            Wheelchair 150 feet activity     Assist          Blood pressure (!) 96/46, pulse 76, temperature 97.6 F (36.4 C), resp. rate 16, height 4\' 9"  (1.448 m), weight 70.7 kg, SpO2 99%.  Medical Problem List and Plan: 1. 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 Motor planning difficulty with RUE per therapies. Limited by inattention. Mod A for transfers and ambulation with a walker. Mod A cognition/attention. Insight poor - has trouble being open to  assitance.   2.  Antithrombotics: -DVT/anticoagulation:  Pharmaceutical: Lovenox              -antiplatelet therapy: Aspirin  81 mg daily and Plavix  75 mg daily x 3 months then Plavix  alone  3. Pain Management: Tylenol  as needed  - 5/17 occasional headache control with Tylenol , continue to monitor  5/19: Pain worse nightly, however patient wishes to continue with repositioning and as needed medications.  No additions at this time.  5-22: ?  Possible underlying carpal tunnel syndrome affecting bilateral hand soreness in the morning--may benefit from  outpatient EMG  5/23: Limited pain management options due to extensive allergy list.  Add Voltaren gel to bilateral hands and low back, lidocaine  patch to low back, and aqua thermia as needed for chronic low back pain, localized  4. Mood/Behavior/Sleep: Effexor  37.5 mg daily, Klonopin  1 mg nightly             -antipsychotic agents: N/A             -Pt reports daughter passed away around the time she was admitted, consider neuropsych consult             -She is not interested in any adjustment to antidepressant medications at this time  5. Neuropsych/cognition: This patient is capable of making decisions on her own behalf.   - 5/20: Would benefit from neuropsych consult; recent death of her daughter --completed 5-23  - 5-27: Therapies reporting patient talking about online boyfriend with some features concerning for catfishing/scam; patient's sister informed  6. Skin/Wound Care: Routine skin checks 7. Fluids/Electrolytes/Nutrition: Routine in and outs with follow-up chemistries 8.  Diabetes mellitus.  Hemoglobin A1c 8.4.  Presently on NovoLog  5 units 3 times daily, Semglee  10 units daily.  Check blood sugars AC and at bedtime.  Diabetic teaching.  Prior to admission patient on Trulicity  0.75 mg weekly, Amaryl  2 mg daily, Glucophage 500 mg in the morning and 1000 mg in the evening, Glucotrol XL 10 mg daily.  Monitor with increased activity CBG (last 3)   5-27: Episode of hypoglycemia this afternoon.  Diabetic coordinator input appreciated, reduced sliding scale insulin  to sensitive and reduce standing Premeal to 5 units.--looking better 5/28 Recent Labs    01/08/24 1634 01/08/24 2050 01/09/24 0547  GLUCAP 108* 121* 102*    9.  AKI on CKD stage III.  Follow-up chemistries -5-19: BUN/creatinine elevated from 17/1 0.19-26/1.39.  P.o. intake seem adequate, continue to encourage and will give 500 cc bolus today.  Repeat labs in AM.  No obvious medication contributions but may need to just adjust venlafaxine  if worsening. 5/20: BUN/creatinine stable.  Discussed with patient, she states she has mostly been drinking sugar-free sodas.  Agreeable to switching to water and drinking 68 to 50 cc cups today .  Repeat labs 5-2 2 AM 5/22: BUN/creatinine remain stable, on chart review baseline creatinine ranges between 1.2 and 1.6.  She is at her current baseline, continue to monitor. 5/23: Creatinine 1.27.  Within baseline, monitor 5/27: Creatinine 1.57, increased in the setting of UTI.  Will start treatment as below and DC losartan.  Repeat in 2 days. 5/28: Administer 1 L lactated Ringer 's today; repeat labs in a.m.  10.  Permissive hypertension.  Monitor with increased mobility.  Resume home Norvasc  5 mg daily, lisinopril  10 mg daily as needed    01/09/2024    4:02 AM 01/08/2024    8:01 PM 01/08/2024    2:45 PM  Vitals with BMI  Systolic 96 105 94  Diastolic 46 58 59  Pulse 76 84 77  5/18 BP intermittently elevated, if continues consider increase Norvasc .  For now we will continue to monitor as do not want to cause hypotension/hypoperfusion 5-19: BP currently in good range, monitor 5/20: BP elevated overnight, resume norvasc  5 mg daily (no BP meds ordered?) 5/21: Increase Norvasc  to 10 mg daily.  Add as needed hydralazine  25 mg every 8 hours as needed for elevated systolic and diastolic blood pressures.  Give a few days for Norvasc  to work. 5-22:  Normotensive.  Monitor. 5-27:  Somewhat hypotensive this afternoon, will DC losartan and may need IV fluids tomorrow if no improvement with infection treatment 5-28: Remains hypotensive, likely secondary to infection/SIRS.  Give 1 L of lactated Ringer 's at 100 cc/h today.  Placed hold parameters for amlodipine .  11.  Hyperlipidemia. Continue Crestor   12.  GERD.  continue Protonix   13.  B12 deficiency             - Outpatient B12 injection.  Try to determine when she had this last  14. Chronic constipation             -Reports BM yesterday             -Miralax  and senokot   -LBM 5/16, patient reports she does not feel constipated and does not have bowel movement every day.  Continue current regimen for now and monitor   5-20: Last bowel movement 5-16, will schedule Senokot S 2 tabs twice daily.  Last bowel movement 5/24--patient denies any feelings of constipation, thinks she has unrecorded bowel movements, monitor for symptoms of constipation     15.  Right hand spasticity.  Baclofen 5 mg twice daily added yesterday.  - 5-22: Encouraged patient to use squeeze foam with therapies to activate and stretch hand in the morning.  Will avoid further baclofen increases due to CKD.  Continue voltaren gel  5-26: No benefit in tone with baclofen; will DC due to CKD  16. Urinary incontinence. -   Patient not endorsing any symptoms suspicious for UTI--later, therapies endorsed confusion, will get UA with reflex to culture.   - Will get PVRs--May start Myrbetriq if no infection and low. - 5-27: Urinalysis positive for UTI, start p.o. Bactrim 800 mg twice daily for 7 days.  Repeat culture as prior had too many bacteria. 5-28: Hypotensive and appearing dehydrated, cultures pending.  Concern for SIRS.  Will switch from oral Bactrim to IV ceftriaxone 1 g daily.  IV fluid as above.  LOS: 12 days A FACE TO FACE EVALUATION WAS PERFORMED  Bea Lime 01/09/2024, 8:30 AM

## 2024-01-09 NOTE — Progress Notes (Signed)
 Occupational Therapy Session Note  Patient Details  Name: Haley Jones MRN: [id number] Date of Birth: February 17, 1956  Today's Date: 01/09/2024 OT Individual Time: 0935-1006 OT Individual Time Calculation (min): 31 min    Short Term Goals: Week 2:  OT Short Term Goal 1 (Week 2): STG=LTG d/t ELOS  Skilled Therapeutic Interventions/Progress Updates:   Pt greeted EOB with NT present, pt agreeable to OT intervention.      Transfers/bed mobility/functional mobility: pt completed stand pivot transfer to w/c with RW and MIN A for balance and MOD cues for RW mgmt and to sequence pivotal steps d/t R inattention.   Therapeutic activity:  Pt completed seated bimanual task with pt instructed to retrieve beads with RUE and thread on string with a focus on [facility] and R visual scanning. Pt completed task with MIN verbal cues for set- up and technique. Pt did best when bead was placed on R side of table vs retrieving beads out of box. Pt completed task with + time and set- up assist.    ADLs:  Grooming: pt completed seated oral care at sink with set- up assist.  UB dressing:donned OH shirt from EOB with MIN A to recall needing to don R UE first. Pt needed assist to pull shirt down d/t visual perceptual deficits.  LB dressing: donned pants from EOB with MODA needing assist to thread BLEs but assisted with pulling pants to waist line.  Footwear: pt able to don slide on shoes with set- up assist from EOB.   Handed pt off to PT for next session in gym.   Therapy Documentation Precautions:  Precautions Precautions: Fall (R inattention) Recall of Precautions/Restrictions: Impaired Restrictions Weight Bearing Restrictions Per Provider Order: No  Pain: no pain     Therapy/Group: Individual Therapy  Willadean Hark 01/09/2024, 12:12 PM

## 2024-01-09 NOTE — Progress Notes (Signed)
 Occupational Therapy Session Note  Patient Details  Name: Haley Jones MRN: [id number] Date of Birth: 15-Feb-1956  Today's Date: 01/09/2024 OT Individual Time: 1425-1515 OT Individual Time Calculation (min): 50 min    Short Term Goals: Week 2:  OT Short Term Goal 1 (Week 2): STG=LTG d/t ELOS  Skilled Therapeutic Interventions/Progress Updates:    Pt received in the w/c c/o back pain, unrated ,but no intervention requested and agreeable to OT session. Pt was taken via w/c to the therapy gym for time management. She worked on several RUE functional reaching activities and R visual scanning using the BITS. Initially provided manual facilitation and then switched to the saebo mobile arm support. Pt did great with this and with the AAROM was able to reach all over the BITS screen with only min facilitation. 5 + trials completed with 20-30 repetitions each time to provide high intensity, high repetition NMR. She then transitioned to BUE ergometer, using only the RUE for high intensity/repetition NMR. OT provided hands on facilitation for motor pattern and RUE shoulder flexion especially. She completed 3x 50 repetitions. Her attention to task was much improved today. She returned to her room and was left sitting up in the w/c with all needs met, chair alarm set.   Therapy Documentation Precautions:  Precautions Precautions: Fall (R inattention) Recall of Precautions/Restrictions: Impaired Restrictions Weight Bearing Restrictions Per Provider Order: No  Therapy/Group: Individual Therapy  [name] 01/09/2024, 9:06 AM

## 2024-01-09 NOTE — Plan of Care (Signed)
  Problem: Consults Goal: RH STROKE PATIENT EDUCATION Description: See Patient Education module for education specifics  Outcome: Progressing   Problem: RH BOWEL ELIMINATION Goal: RH STG MANAGE BOWEL WITH ASSISTANCE Description: STG Manage Bowel with Assistance. Outcome: Progressing   Problem: RH BLADDER ELIMINATION Goal: RH STG MANAGE BLADDER WITH ASSISTANCE Description: STG Manage Bladder With supervision Assistance Outcome: Progressing   Problem: RH SKIN INTEGRITY Goal: RH STG SKIN FREE OF INFECTION/BREAKDOWN Description: Manage skin free of infection/breakdown with supervision Outcome: Progressing   Problem: RH SAFETY Goal: RH STG ADHERE TO SAFETY PRECAUTIONS W/ASSISTANCE/DEVICE Description: STG Adhere to Safety Precautions With supervision Assistance/Device. Outcome: Progressing   Problem: RH PAIN MANAGEMENT Goal: RH STG PAIN MANAGED AT OR BELOW PT'S PAIN GOAL Description: <4 w/ prns Outcome: Progressing   Problem: RH KNOWLEDGE DEFICIT Goal: RH STG INCREASE KNOWLEDGE OF DIABETES Description: Manage increase knowledge of diabetes with supervision from sister using educational materials provided Outcome: Progressing Goal: RH STG INCREASE KNOWLEDGE OF HYPERTENSION Description: Manage increase knowledge of hypertension with supervision from sister using educational materials provided Outcome: Progressing Goal: RH STG INCREASE KNOWLEGDE OF HYPERLIPIDEMIA Description: Manage increase knowledge of hyperlipidemia with supervision from sister using educational materials provided Outcome: Progressing Goal: RH STG INCREASE KNOWLEDGE OF STROKE PROPHYLAXIS Description: Manage increase knowledge of stroke prophylaxis  with supervision from sister using educational materials provided Outcome: Progressing   Problem: Consults Goal: RH STROKE PATIENT EDUCATION Description: See Patient Education module for education specifics  Outcome: Progressing

## 2024-01-09 NOTE — Progress Notes (Signed)
 Physical Therapy Session Note  Patient Details  Name: Haley Jones MRN: [id number] Date of Birth: 28-Aug-1955  {CHL IP REHAB PT TIME CALCULATION:[id number]}  Short Term Goals: Week 2:  PT Short Term Goal 1 (Week 2): STGs = LTGs  Skilled Therapeutic Interventions/Progress Updates:     Pt received seated in WC in gym and agrees to therapy. No complaint of pain. Pt performs multiple bouts of ambulation to work on gait training as well as NMR fr Rt sided attention and overall mobility. PT informs pt that task is to ambulate a lap around gym with PT providing as few cues as possible, challenging pt to increase awareness and attention especially to Rt side. Pt completes lap with RW with CGA and minimal cueing for safety and RW management, ambulating x100'. Following rest break, pt completes lap with several objects placed in path to force pt to attend to objects and avoid them with RW. Pt is able to complete with minimal cueing. Seated rest break. Pt then ambulates lap in opposite direction with obstacles in different position to challenge pt's problem solving and attention. Pt completes with mod cueing with some difficulty managing obstacle placed in path close to [facility]. Pt performs final bout of x100' with obstacles in new arrangement, with pt completing majority without physical assistance or cueing, but requiring minA to safely maneuver back to [facility], when tasked with thurning RW sideways to sidestep toward WC. WC transport back to room. Left seated with alarm intact and all needs within reach.   Therapy Documentation Precautions:  Precautions Precautions: Fall (R inattention) Recall of Precautions/Restrictions: Impaired Restrictions Weight Bearing Restrictions Per Provider Order: No   Therapy/Group: Individual Therapy  Neva Barban, PT DPT 01/09/2024, 4:22 PM

## 2024-01-10 DIAGNOSIS — I63512 Cerebral infarction due to unspecified occlusion or stenosis of left middle cerebral artery: Secondary | ICD-10-CM | POA: Diagnosis not present

## 2024-01-10 LAB — BASIC METABOLIC PANEL WITH GFR
Anion gap: 8 (ref 5–15)
BUN: 31 mg/dL — ABNORMAL HIGH (ref 8–23)
CO2: 21 mmol/L — ABNORMAL LOW (ref 22–32)
Calcium: 9.1 mg/dL (ref 8.9–10.3)
Chloride: 106 mmol/L (ref 98–111)
Creatinine, Ser: 1.4 mg/dL — ABNORMAL HIGH (ref 0.44–1.00)
GFR, Estimated: 41 mL/min — ABNORMAL LOW (ref >60)
Glucose, Bld: 155 mg/dL — ABNORMAL HIGH (ref 70–99)
Potassium: 4.3 mmol/L (ref 3.5–5.1)
Sodium: 135 mmol/L (ref 135–145)

## 2024-01-10 LAB — URINE CULTURE: Culture: >=100000 — AB

## 2024-01-10 LAB — GLUCOSE, CAPILLARY
Glucose-Capillary: 104 mg/dL — ABNORMAL HIGH (ref 70–99)
Glucose-Capillary: 126 mg/dL — ABNORMAL HIGH (ref 70–99)
Glucose-Capillary: 126 mg/dL — ABNORMAL HIGH (ref 70–99)
Glucose-Capillary: 84 mg/dL (ref 70–99)

## 2024-01-10 MED ORDER — SULFAMETHOXAZOLE-TRIMETHOPRIM 800-160 MG PO TABS
1.0000 | ORAL_TABLET | Freq: Two times a day (BID) | ORAL | Status: AC
  Administered 2024-01-11 – 2024-01-14 (×8): 1 via ORAL
  Filled 2024-01-10 (×8): qty 1

## 2024-01-10 MED ORDER — SORBITOL 70 % SOLN
30.0000 mL | Freq: Every day | Status: DC | PRN
  Administered 2024-01-10: 30 mL via ORAL
  Filled 2024-01-10: qty 30

## 2024-01-10 NOTE — Progress Notes (Signed)
 Speech Language Pathology Daily Session Note  Patient Details  Name: Haley Jones MRN: [id number] Date of Birth: August 17, 1955  Today's Date: 01/10/2024 SLP Individual Time: 1000-1100 SLP Individual Time Calculation (min): 60 min  Short Term Goals: Week 2: SLP Short Term Goal 1 (Week 2): STGs = LTGs d/t ELOS  Skilled Therapeutic Interventions:   Pt greeted in her room. She was up in her wheelchair after PT tx session. During initial conversation re therapy tasks across disciplines, she benefited from [facility] for problem solving/reasoning. She then completed menu task initiated in prev tx session. She required maxA for organization, information processing, and calculations throughout. Additionally, she benefited from modA cues to use the calculator d/t attention to detail deficits, as she would often forget to clear out the prev calculation or omit decimal points. She then completed a visual scanning and sustained attention task ID single words from fo 18. She required additional processing time to complete, though otherwise independent. At the end of tx tasks, she was left in her chair with the alarm set and call light within reach. Recommend cont ST per POC.   Pain Pain Assessment Pain Scale: 0-10 Pain Score: 4  Pain Type: Acute pain Pain Location: Back Pain Orientation: Right Pain Descriptors / Indicators: Aching;Discomfort Pain Frequency: Intermittent Pain Onset: Gradual Pain Intervention(s): Medication (See eMAR)  Therapy/Group: Individual Therapy  [name] 01/10/2024, 10:27 AM

## 2024-01-10 NOTE — Progress Notes (Signed)
 Occupational Therapy Session Note  Patient Details  Name: Haley Jones MRN: [id number] Date of Birth: Aug 10, 1956  Today's Date: 01/10/2024 OT Individual Time: 1404-1500 OT Individual Time Calculation (min): 56 min    Short Term Goals: Week 1:  OT Short Term Goal 1 (Week 1): Patient will complete sit to stand using RW with close S OT Short Term Goal 1 - Progress (Week 1): Not met OT Short Term Goal 2 (Week 1): The pt will complete UB/LB dressing with s/u A for UB and MinA for LB using the hemi technique OT Short Term Goal 2 - Progress (Week 1): Not met OT Short Term Goal 3 (Week 1): The pt will demonstrate increase attention to the Rt Side of the body 80% of the time following intial cues and demostration. OT Short Term Goal 3 - Progress (Week 1): Met OT Short Term Goal 4 (Week 1): The pt will improve bilateral hand coordination by incorporating the RUE as a prime mover 80% of the time OT Short Term Goal 4 - Progress (Week 1): Not met OT Short Term Goal 5 (Week 1): The pt wil partipate in cognitive activities with 80% accuracy involving  memory and with 1-5 steps iinstuction. OT Short Term Goal 5 - Progress (Week 1): Met Week 2:  OT Short Term Goal 1 (Week 2): STG=LTG d/t ELOS Week 3:     Skilled Therapeutic Interventions/Progress Updates:    1:1 Pt received in the w/c and reported her back was hurting and wasn't feeling good today. Pt declined formal ADL today and a shower. Pt declined trying to ambulate to the dayroom (or back to her room later in the session due to back pain). NMR with focus on use of right Ue proximal to distal with visual attention to right hand during activity. Performed bilateral AROM while holding a soft ball with increasing degrees of freedom close and then away from the body with support against gravity for right shoulder. Pt with continued difficulty with shoulder flexion to 90 degrees but able to tolerate passive. Place the ball on a stool and with her  right hand on top of the ball maintaining her UE position while the stool moved working on shoulder stability and visual attention to right in dynamic movement. Pt also performed crumbling paper and throwing for gross movement timing and coordination. Also performing modified ring arch activity with support to right UE to go through ROM Pt declined ambulation back. Pt left sitting up in w/c with chair alarm and pillow for back support.   Therapy Documentation Precautions:  Precautions Precautions: Fall (R inattention) Recall of Precautions/Restrictions: Impaired Restrictions Weight Bearing Restrictions Per Provider Order: No  Pain: Pain Assessment Pain Scale: 0-10 Pain Score: 6  Pain Location: Back received meds and rub and rest breaks as needed. Pt deferred walking in the session     Therapy/Group: Individual Therapy  Henrene Locust [facility] 01/10/2024, 4:59 PM

## 2024-01-10 NOTE — Progress Notes (Signed)
 Physical Therapy Session Note  Patient Details  Name: Haley Jones MRN: [id number] Date of Birth: 1956/06/16  Today's Date: 01/10/2024 PT Individual Time: 0850-1000 PT Individual Time Calculation (min): 70 min   Short Term Goals: Week 2:  PT Short Term Goal 1 (Week 2): STGs = LTGs  Skilled Therapeutic Interventions/Progress Updates:     Pt received seated in WC and agrees to therapy. Reports soreness in Rt hemibody. PT provides gentle mobility to manage pain. WC transport to gym. WC transport to gym for time management. Pt performs stand step transfer to Nustep with minA due to pt attempting to sit prior to being in safe position. Pt performs reciprocal scooting to back of chair with cues for body mechanics. Pt competes Nustep with only RUE and RLE to provide NMR for Rt inattention and hemiplegia. Pt completes x10:00 at workload of 4 (decreased to 2 after 4:00). Pt requires frequent cues to complete increased ROM as she tends to oscillate arm and leg in very short range. Pt requires minA for transfer from Nustep to [facility] with cues for sequencing and positioning. Extended seated rest break with report of fatigue. Pt then ambulates 3x175' with RW and CGA, with cues for attending to Rt and safe RW management. Pt demonstrates improved stride length and gait speed relative to prior sessions, and requires fewer cues to attend to Rt side. WC transport back to room. Left seated with alarm intact and all needs within reach.   Therapy Documentation Precautions:  Precautions Precautions: Fall (R inattention) Recall of Precautions/Restrictions: Impaired Restrictions Weight Bearing Restrictions Per Provider Order: No   Therapy/Group: Individual Therapy  Neva Barban, PT, DPT 01/10/2024, 4:41 PM

## 2024-01-10 NOTE — Progress Notes (Signed)
 PROGRESS NOTE   Subjective/Complaints:  No acute complaints.  Blood pressures looking much better today. A.m. labs significant for increasing BUN, decreasing creatinine, But sugars very tightly controlled currently Urine culture positive for E. coli, pending susceptibilities Bowel movement 5-24, getting sorbitol  today  ROS: Patient denies fever, new vision changes, dizziness, nausea, vomiting, diarrhea,  shortness of breath or chest pain, or mood change. + Right hand tightness-ongoing + Low back pain--ongoing + Urinary incontinence-ongoing.    Objective:   No results found. Recent Labs    01/08/24 1306  WBC 6.9  HGB 12.0  HCT 37.4  PLT 243    Recent Labs    01/08/24 1306 01/10/24 0715  NA 138 135  K 4.6 4.3  CL 104 106  CO2 23 21*  GLUCOSE 162* 155*  BUN 29* 31*  CREATININE 1.52* 1.40*  CALCIUM  8.9 9.1     Intake/Output Summary (Last 24 hours) at 01/10/2024 1016 Last data filed at 01/10/2024 0745 Gross per 24 hour  Intake 1031.55 ml  Output --  Net 1031.55 ml         Physical Exam: Vital Signs Blood pressure 138/74, pulse 68, temperature 97.6 F (36.4 C), resp. rate 17, height 4\' 9"  (1.448 m), weight 70.7 kg, SpO2 94%.   General: No apparent distress, sitting up in bed HEENT: Head is normocephalic, atraumatic, MMM.  Mild exotropia.  Wearing glasses. Neck: Supple without JVD or lymphadenopathy Heart: Reg rate and rhythm. No murmurs rubs or gallops Chest: CTAB, nonlabored breathing Abdomen: Soft, non-tender, non-distended, bowel sounds positive. Extremities: No clubbing, cyanosis, or edema. Pulses are 2+ Psych: Pt's affect is appropriate. Pt is cooperative.  Skin: No apparent skin breakdown, no apparent lesions. Mild   bruising around IV site.  Neuro:    Mental Status: AAOx4, awake, alert.  + Right-sided hemineglect, --ongoing, moderate + delay in processing, mild.--Ongoing CRANIAL NERVES:  Right facial weakness, mild right homonymous hemianopsia  RUE: 4-/5 Deltoid, 4/5 Biceps, 4/5 Triceps,5-/5 Grip LUE: 5/5 Deltoid, 5/5 Biceps, 5/5 Triceps, 5/5 Grip RLE: HF 4-/5, KE 4+/5, ADF 4+/5, APF 4+/5 LLE: HF 5/5, KE 5/5, ADF 5/5, APF 5/5  Tone: MAS 1 right finger flexors, otherwise none appreciable   SENSORY: Normal to touch all 4 extremities   Coordination: Right finger-nose altered on the right slightly   MSK: No joint swelling noted. R shoulder ROM <30 degrees   Assessment/Plan: 1. Functional deficits which require 3+ hours per day of interdisciplinary therapy in a comprehensive inpatient rehab setting. Physiatrist is providing close team supervision and 24 hour management of active medical problems listed below. Physiatrist and rehab team continue to assess barriers to discharge/monitor patient progress toward functional and medical goals  Care Tool:  Bathing    Body parts bathed by patient: Right arm, Left arm, Chest, Abdomen, Front perineal area, Buttocks, Right upper leg, Left upper leg, Right lower leg, Left lower leg, Face         Bathing assist Assist Level: Moderate Assistance - Patient 50 - 74%     Upper Body Dressing/Undressing Upper body dressing   What is the patient wearing?: Pull over shirt    Upper body assist Assist Level: Minimal  Assistance - Patient > 75%    Lower Body Dressing/Undressing Lower body dressing      What is the patient wearing?: Incontinence brief, Pants     Lower body assist Assist for lower body dressing: Moderate Assistance - Patient 50 - 74%     Toileting Toileting    Toileting assist Assist for toileting: Moderate Assistance - Patient 50 - 74%     Transfers Chair/bed transfer  Transfers assist  Chair/bed transfer activity did not occur: Safety/medical concerns  Chair/bed transfer assist level: Minimal Assistance - Patient > 75%     Locomotion Ambulation   Ambulation assist      Assist level: Minimal  Assistance - Patient > 75% Assistive device: Walker-rolling Max distance: 175'   Walk 10 feet activity   Assist     Assist level: Minimal Assistance - Patient > 75% Assistive device: Walker-rolling   Walk 50 feet activity   Assist    Assist level: Minimal Assistance - Patient > 75% Assistive device: Walker-rolling    Walk 150 feet activity   Assist    Assist level: Minimal Assistance - Patient > 75% Assistive device: Walker-rolling    Walk 10 feet on uneven surface  activity   Assist     Assist level: Minimal Assistance - Patient > 75% Assistive device: Walker-rolling   Wheelchair     Assist Is the patient using a wheelchair?: No             Wheelchair 50 feet with 2 turns activity    Assist            Wheelchair 150 feet activity     Assist          Blood pressure 138/74, pulse 68, temperature 97.6 F (36.4 C), resp. rate 17, height 4\' 9"  (1.448 m), weight 70.7 kg, SpO2 94%.  Medical Problem List and Plan: 1. 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 Motor planning difficulty with RUE per therapies. Limited by inattention. Mod A for transfers and ambulation with a walker. Mod A cognition/attention. Insight poor - has trouble being open to assitance.   2.  Antithrombotics: -DVT/anticoagulation:  Pharmaceutical: Lovenox              -antiplatelet therapy: Aspirin  81 mg daily and Plavix  75 mg daily x 3 months then Plavix  alone  3. Pain Management: Tylenol  as needed  - 5/17 occasional headache control with Tylenol , continue to monitor  5/19: Pain worse nightly, however patient wishes to continue with repositioning and as needed medications.  No additions at this time.  5-22: ?  Possible underlying carpal tunnel syndrome affecting bilateral hand  soreness in the morning--may benefit from outpatient EMG  5/23: Limited pain management options due to extensive allergy list.  Add Voltaren  gel to bilateral hands and low back, lidocaine  patch to low back, and aqua thermia as needed for chronic low back pain, localized  4. Mood/Behavior/Sleep: Effexor  37.5 mg daily, Klonopin  1 mg nightly             -antipsychotic agents: N/A             -Pt reports daughter passed away around  the time she was admitted, consider neuropsych consult             -She is not interested in any adjustment to antidepressant medications at this time  5. Neuropsych/cognition: This patient is capable of making decisions on her own behalf.   - 5/20: Would benefit from neuropsych consult; recent death of her daughter --completed 5-23  - 5-27: Therapies reporting patient talking about online boyfriend with some features concerning for catfishing/scam; patient's sister informed  6. Skin/Wound Care: Routine skin checks 7. Fluids/Electrolytes/Nutrition: Routine in and outs with follow-up chemistries 8.  Diabetes mellitus.  Hemoglobin A1c 8.4.  Presently on NovoLog  5 units 3 times daily, Semglee  10 units daily.  Check blood sugars AC and at bedtime.  Diabetic teaching.  Prior to admission patient on Trulicity  0.75 mg weekly, Amaryl  2 mg daily, Glucophage 500 mg in the morning and 1000 mg in the evening, Glucotrol XL 10 mg daily.  Monitor with increased activity CBG (last 3)  5-27: Episode of hypoglycemia this afternoon.  Diabetic coordinator input appreciated, reduced sliding scale insulin  to sensitive and reduce standing Premeal to 5 units.--looking better 5/28 Recent Labs    01/09/24 1638 01/09/24 2140 01/10/24 0619  GLUCAP 101* 118* 104*    9.  AKI on CKD stage III.  Follow-up chemistries -5-19: BUN/creatinine elevated from 17/1 0.19-26/1.39.  P.o. intake seem adequate, continue to encourage and will give 500 cc bolus today.  Repeat labs in AM.  No obvious medication  contributions but may need to just adjust venlafaxine  if worsening. 5/20: BUN/creatinine stable.  Discussed with patient, she states she has mostly been drinking sugar-free sodas.  Agreeable to switching to water and drinking 68 to 50 cc cups today .  Repeat labs 5-2 2 AM 5/22: BUN/creatinine remain stable, on chart review baseline creatinine ranges between 1.2 and 1.6.  She is at her current baseline, continue to monitor. 5/23: Creatinine 1.27.  Within baseline, monitor 5/27: Creatinine 1.57, increased in the setting of UTI.  Will start treatment as below and DC losartan.  Repeat in 2 days. 5/28: Administer 1 L lactated Ringer 's today; repeat labs in a.m.--mild improvement, continue to encourage POs  10.  Permissive hypertension.  Monitor with increased mobility.  Resume home Norvasc  5 mg daily, lisinopril  10 mg daily as needed    01/10/2024    5:43 AM 01/09/2024    7:38 PM 01/09/2024    1:00 PM  Vitals with BMI  Systolic 138 117 147  Diastolic 74 85 67  Pulse 68 77 75  5/18 BP intermittently elevated, if continues consider increase Norvasc .  For now we will continue to monitor as do not want to cause hypotension/hypoperfusion 5-19: BP currently in good range, monitor 5/20: BP elevated overnight, resume norvasc  5 mg daily (no BP meds ordered?) 5/21: Increase Norvasc  to 10 mg daily.  Add as needed hydralazine  25 mg every 8 hours as needed for elevated systolic and diastolic blood pressures.  Give a few days for Norvasc  to work. 5-22: Normotensive.  Monitor. 5-27: Somewhat hypotensive this afternoon, will DC losartan and may need IV fluids tomorrow if no improvement with infection treatment 5-28: Remains hypotensive, likely secondary to infection/SIRS.  Give 1 L of lactated Ringer 's at 100 cc/h today.  Placed hold parameters for amlodipine . 5/29: BP looking much better. Normotensive.    11.  Hyperlipidemia. Continue Crestor   12.  GERD.  continue Protonix   13.  B12 deficiency             -  Outpatient B12 injection.  Try to determine when she had this last  14. Chronic constipation             -Reports BM yesterday             -Miralax  and senokot   -LBM 5/16, patient reports she does not feel constipated and does not have bowel movement every day.  Continue current regimen for now and monitor   5-20: Last bowel movement 5-16, will schedule Senokot S 2 tabs twice daily.  Last bowel movement 5/24--sorbitol PRN given 5/29     15.  Right hand spasticity.  Baclofen 5 mg twice daily added yesterday.  - 5-22: Encouraged patient to use squeeze foam with therapies to activate and stretch hand in the morning.  Will avoid further baclofen increases due to CKD.  Continue voltaren gel  5-26: No benefit in tone with baclofen; will DC due to CKD  16. Urinary incontinence. -   Patient not endorsing any symptoms suspicious for UTI--later, therapies endorsed confusion, will get UA with reflex to culture.   - Will get PVRs--May start Myrbetriq if no infection and low. - 5-27: Urinalysis positive for UTI, start p.o. Bactrim 800 mg twice daily for 7 days.  Repeat culture as prior had too many bacteria. 5-28: Hypotensive and appearing dehydrated, cultures pending.  Concern for SIRS.  Will switch from oral Bactrim to IV ceftriaxone 1 g daily.  IV fluid as above. 5/29: + e coli, sensitivities back, switch to PO bactrim 800 mg Q12H for 4 days to finish 7 day course   LOS: 13 days A FACE TO FACE EVALUATION WAS PERFORMED  Bea Lime 01/10/2024, 10:16 AM

## 2024-01-11 DIAGNOSIS — I63512 Cerebral infarction due to unspecified occlusion or stenosis of left middle cerebral artery: Secondary | ICD-10-CM | POA: Diagnosis not present

## 2024-01-11 LAB — GLUCOSE, CAPILLARY
Glucose-Capillary: 113 mg/dL — ABNORMAL HIGH (ref 70–99)
Glucose-Capillary: 99 mg/dL (ref 70–99)
Glucose-Capillary: 168 mg/dL — ABNORMAL HIGH (ref 70–99)
Glucose-Capillary: 53 mg/dL — ABNORMAL LOW (ref 70–99)
Glucose-Capillary: 73 mg/dL (ref 70–99)

## 2024-01-11 LAB — CREATININE, SERUM
Creatinine, Ser: 1.36 mg/dL — ABNORMAL HIGH (ref 0.44–1.00)
GFR, Estimated: 43 mL/min — ABNORMAL LOW (ref >60)

## 2024-01-11 MED ORDER — POLYETHYLENE GLYCOL 3350 17 G PO PACK
17.0000 g | PACK | Freq: Two times a day (BID) | ORAL | Status: DC
  Administered 2024-01-14 – 2024-01-17 (×4): 17 g via ORAL
  Filled 2024-01-11 (×10): qty 1

## 2024-01-11 MED ORDER — MILK AND MOLASSES ENEMA
1.0000 | Freq: Once | RECTAL | Status: AC | PRN
  Filled 2024-01-11 (×2): qty 240

## 2024-01-11 MED ORDER — POLYETHYLENE GLYCOL 3350 17 GM/SCOOP PO POWD
119.0000 g | Freq: Once | ORAL | Status: AC | PRN
  Administered 2024-01-11: 119 g via ORAL
  Filled 2024-01-11: qty 119

## 2024-01-11 NOTE — Progress Notes (Signed)
 Occupational Therapy Session Note  Patient Details  Name: Haley Jones MRN: [id number] Date of Birth: [birth year]  Today's Date: 01/11/2024 OT Individual Time: 1445-1515 OT Individual Time Calculation (min): 30 min    Short Term Goals: Week 2:  OT Short Term Goal 1 (Week 2): STG=LTG d/t ELOS OT Short Term Goal 1 - Progress (Week 2): Progressing toward goal  Skilled Therapeutic Interventions/Progress Updates:    Pt received sitting with no c/o pain, agreeable to OT session. Pt was taken via w/c to the therapy gym for time management. Focus of session on RUE NMR. She stood and in a closed chain (against the wall) completed combo weight bearing and AAROM shoulder flexion, IR/ER, and horizontal adduction/abduction. She required min facilitation at the elbow with excellent activation in this plane- able to achieve 100 degrees in flexion. Rest breaks utilized during session for fatigue management. She then stood with no UE support with min A and worked on BUE coordination/proprioception by passing a ball in front of her and then behind her back as well. She required min facilitation for RUE IR and grasp. She returned to her room and was left sitting up with all needs met, chair alarm set.  Therapy Documentation Precautions:  Precautions Precautions: Fall (R inattention) Recall of Precautions/Restrictions: Impaired Restrictions Weight Bearing Restrictions Per Provider Order: No   Therapy/Group: Individual Therapy  [name] 01/11/2024, 2:57 PM

## 2024-01-11 NOTE — Plan of Care (Signed)
  Problem: RH Memory Goal: LTG Patient will use memory compensatory aids to (SLP) Description: LTG:  Patient will use memory compensatory aids to recall biographical/new, daily complex information with cues (SLP) Flowsheets (Taken 01/11/2024 0732) LTG: Patient will use memory compensatory aids to (SLP): Maximal Assistance - Patient 25 - 49%

## 2024-01-11 NOTE — Progress Notes (Signed)
 PROGRESS NOTE   Subjective/Complaints:  No acute complaints.  Reports she is doing much better in therapies today with participation and hemineglect. Slightly hypertensive today, other vitals are stable. Creatinine looking a little bit better. Sugars are tightly controlled  No BM with sorbitol yesterday   ROS: Patient denies fever, new vision changes, dizziness, nausea, vomiting, diarrhea,  shortness of breath or chest pain, or mood change. + Right hand tightness-ongoing + Low back pain--ongoing + Urinary incontinence-ongoing.    Objective:   No results found. Recent Labs    01/08/24 1306  WBC 6.9  HGB 12.0  HCT 37.4  PLT 243    Recent Labs    01/08/24 1306 01/10/24 0715 01/11/24 0718  NA 138 135  --   K 4.6 4.3  --   CL 104 106  --   CO2 23 21*  --   GLUCOSE 162* 155*  --   BUN 29* 31*  --   CREATININE 1.52* 1.40* 1.36*  CALCIUM  8.9 9.1  --      Intake/Output Summary (Last 24 hours) at 01/11/2024 1016 Last data filed at 01/11/2024 0735 Gross per 24 hour  Intake 716 ml  Output --  Net 716 ml         Physical Exam: Vital Signs Blood pressure (!) 148/79, pulse 66, temperature (!) 97.5 F (36.4 C), temperature source Oral, resp. rate 16, height 4\' 9"  (1.448 m), weight 70.7 kg, SpO2 99%.   General: No apparent distress, sitting up in bedside chair. HEENT: Head is normocephalic, atraumatic, MMM.  Mild strabismus.  Wearing glasses. Neck: Supple without JVD or lymphadenopathy Heart: Reg rate and rhythm. No murmurs rubs or gallops Chest: CTAB, nonlabored breathing Abdomen: Soft, non-tender, non-distended, bowel sounds positive. Extremities: No clubbing, cyanosis, or edema. Pulses are 2+ Psych: Pt's affect is appropriate. Pt is cooperative.  Skin: No apparent skin breakdown, no apparent lesions. Mild   bruising around IV site.  Neuro:    Mental Status: AAOx4, awake, alert.  + Right-sided  hemineglect, --ongoing, moderate + delay in processing, mild.--Ongoing CRANIAL NERVES:  mild right homonymous hemianopsia--unchanged  RUE: 4-/5 Deltoid, 4/5 Biceps, 4/5 Triceps,5-/5 Grip LUE: 5/5 Deltoid, 5/5 Biceps, 5/5 Triceps, 5/5 Grip RLE: HF 4-/5, KE 4+/5, ADF 4+/5, APF 4+/5 LLE: HF 5/5, KE 5/5, ADF 5/5, APF 5/5  Tone: MAS 0 throughout right upper and lower extremity   SENSORY: Normal to touch all 4 extremities   Coordination: Slightly reduced in right upper extremity   MSK: No joint swelling noted. R shoulder ROM <50 degrees   Assessment/Plan: 1. Functional deficits which require 3+ hours per day of interdisciplinary therapy in a comprehensive inpatient rehab setting. Physiatrist is providing close team supervision and 24 hour management of active medical problems listed below. Physiatrist and rehab team continue to assess barriers to discharge/monitor patient progress toward functional and medical goals  Care Tool:  Bathing    Body parts bathed by patient: Right arm, Left arm, Chest, Abdomen, Front perineal area, Buttocks, Right upper leg, Left upper leg, Right lower leg, Left lower leg, Face         Bathing assist Assist Level: Moderate Assistance - Patient 50 -  74%     Upper Body Dressing/Undressing Upper body dressing   What is the patient wearing?: Pull over shirt    Upper body assist Assist Level: Minimal Assistance - Patient > 75%    Lower Body Dressing/Undressing Lower body dressing      What is the patient wearing?: Incontinence brief, Pants     Lower body assist Assist for lower body dressing: Moderate Assistance - Patient 50 - 74%     Toileting Toileting    Toileting assist Assist for toileting: Moderate Assistance - Patient 50 - 74%     Transfers Chair/bed transfer  Transfers assist  Chair/bed transfer activity did not occur: Safety/medical concerns  Chair/bed transfer assist level: Minimal Assistance - Patient > 75%      Locomotion Ambulation   Ambulation assist      Assist level: Minimal Assistance - Patient > 75% Assistive device: Walker-rolling Max distance: 175'   Walk 10 feet activity   Assist     Assist level: Minimal Assistance - Patient > 75% Assistive device: Walker-rolling   Walk 50 feet activity   Assist    Assist level: Minimal Assistance - Patient > 75% Assistive device: Walker-rolling    Walk 150 feet activity   Assist    Assist level: Minimal Assistance - Patient > 75% Assistive device: Walker-rolling    Walk 10 feet on uneven surface  activity   Assist     Assist level: Minimal Assistance - Patient > 75% Assistive device: Walker-rolling   Wheelchair     Assist Is the patient using a wheelchair?: No             Wheelchair 50 feet with 2 turns activity    Assist            Wheelchair 150 feet activity     Assist          Blood pressure (!) 148/79, pulse 66, temperature (!) 97.5 F (36.4 C), temperature source Oral, resp. rate 16, height 4\' 9"  (1.448 m), weight 70.7 kg, SpO2 99%.  Medical Problem List and Plan: 1. 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 Motor planning difficulty with RUE per therapies. Limited by inattention. Mod A for transfers and ambulation with a walker. Mod A cognition/attention. Insight poor - has trouble being open to assitance.   2.  Antithrombotics: -DVT/anticoagulation:  Pharmaceutical: Lovenox              -antiplatelet therapy: Aspirin  81 mg daily and Plavix  75 mg daily x 3 months then Plavix  alone  3. Pain Management: Tylenol  as needed  - 5/17 occasional headache control with Tylenol , continue to monitor  5/19: Pain worse nightly, however patient wishes to continue with repositioning and as needed medications.   No additions at this time.  5-22: ?  Possible underlying carpal tunnel syndrome affecting bilateral hand soreness in the morning--may benefit from outpatient EMG  5/23: Limited pain management options due to extensive allergy list.  Add Voltaren  gel to bilateral hands and low back, lidocaine  patch to low back, and aqua thermia as needed for chronic low back pain, localized  4. Mood/Behavior/Sleep: Effexor  37.5 mg daily, Klonopin  1  mg nightly             -antipsychotic agents: N/A             -Pt reports daughter passed away around the time she was admitted, consider neuropsych consult             -She is not interested in any adjustment to antidepressant medications at this time  5. Neuropsych/cognition: This patient is capable of making decisions on her own behalf.   - 5/20: Would benefit from neuropsych consult; recent death of her daughter --completed 5-23  - 5-27: Therapies reporting patient talking about online boyfriend with some features concerning for catfishing/scam; patient's sister informed  6. Skin/Wound Care: Routine skin checks 7. Fluids/Electrolytes/Nutrition: Routine in and outs with follow-up chemistries 8.  Diabetes mellitus.  Hemoglobin A1c 8.4.  Presently on NovoLog  5 units 3 times daily, Semglee  10 units daily.  Check blood sugars AC and at bedtime.  Diabetic teaching.  Prior to admission patient on Trulicity  0.75 mg weekly, Amaryl  2 mg daily, Glucophage 500 mg in the morning and 1000 mg in the evening, Glucotrol XL 10 mg daily.  Monitor with increased activity CBG (last 3)  5-27: Episode of hypoglycemia this afternoon.  Diabetic coordinator input appreciated, reduced sliding scale insulin  to sensitive and reduce standing Premeal to 5 units.--looking better 5/28 -Sugar stable Recent Labs    01/10/24 1649 01/10/24 2138 01/11/24 0558  GLUCAP 126* 84 113*    9.  AKI on CKD stage III.  Follow-up chemistries -5-19: BUN/creatinine elevated from 17/1 0.19-26/1.39.  P.o.  intake seem adequate, continue to encourage and will give 500 cc bolus today.  Repeat labs in AM.  No obvious medication contributions but may need to just adjust venlafaxine  if worsening. 5/20: BUN/creatinine stable.  Discussed with patient, she states she has mostly been drinking sugar-free sodas.  Agreeable to switching to water and drinking 68 to 50 cc cups today .  Repeat labs 5-2 2 AM 5/22: BUN/creatinine remain stable, on chart review baseline creatinine ranges between 1.2 and 1.6.  She is at her current baseline, continue to monitor. 5/23: Creatinine 1.27.  Within baseline, monitor 5/27: Creatinine 1.57, increased in the setting of UTI.  Will start treatment as below and DC losartan.  Repeat in 2 days. 5/28: Administer 1 L lactated Ringer 's today; repeat labs in a.m.--mild improvement, continue to encourage POs  10.  Permissive hypertension.  Monitor with increased mobility.  Resume home Norvasc  5 mg daily, lisinopril  10 mg daily as needed    01/11/2024    2:44 AM 01/10/2024    8:44 PM 01/10/2024    1:06 PM  Vitals with BMI  Systolic 148 121 045  Diastolic 79 78 74  Pulse 66 79 76  5/18 BP intermittently elevated, if continues consider increase Norvasc .  For now we will continue to monitor as do not want to cause hypotension/hypoperfusion 5-19: BP currently in good range, monitor 5/20: BP elevated overnight, resume norvasc  5 mg daily (no BP meds ordered?) 5/21: Increase Norvasc  to 10 mg daily.  Add as needed hydralazine  25 mg every 8 hours as needed for elevated systolic and diastolic blood pressures.  Give a few days for Norvasc  to work. 5-22: Normotensive.  Monitor. 5-27: Somewhat hypotensive this afternoon, will DC losartan and may need IV fluids tomorrow if no improvement with infection treatment 5-28: Remains hypotensive, likely secondary to infection/SIRS.  Give 1 L of lactated Ringer 's at 100 cc/h today.  Placed hold parameters for  amlodipine . 5/29: BP looking much better.  Normotensive.   5-30: BP a little up this morning, will monitor 1 day and if needed resume home lisinopril   11.  Hyperlipidemia. Continue Crestor   12.  GERD.  continue Protonix   13.  B12 deficiency             - Outpatient B12 injection.  Try to determine when she had this last  14. Chronic constipation             -Reports BM yesterday             -Miralax  and senokot   -LBM 5/16, patient reports she does not feel constipated and does not have bowel movement every day.  Continue current regimen for now and monitor   5-20: Last bowel movement 5-16, will schedule Senokot S 2 tabs twice daily.  Last bowel movement 5/24--sorbitol PRN given 5/29  5/30: Unresponsive to sorbitol yesterday; give bowel prep versus enema today     15.  Right hand spasticity.  Baclofen 5 mg twice daily added yesterday.  - 5-22: Encouraged patient to use squeeze foam with therapies to activate and stretch hand in the morning.  Will avoid further baclofen increases due to CKD.  Continue voltaren gel  5-26: No benefit in tone with baclofen; will DC due to CKD  5-30 resolved  16. Urinary incontinence/UTI -   Patient not endorsing any symptoms suspicious for UTI--later, therapies endorsed confusion, will get UA with reflex to culture.   - Will get PVRs--May start Myrbetriq if no infection and low. - 5-27: Urinalysis positive for UTI, start p.o. Bactrim 800 mg twice daily for 7 days.  Repeat culture as prior had too many bacteria. 5-28: Hypotensive and appearing dehydrated, cultures pending.  Concern for SIRS.  Will switch from oral Bactrim to IV ceftriaxone 1 g daily.  IV fluid as above. 5/29: + e coli, sensitivities back, switch to PO bactrim 800 mg Q12H for 4 days to finish 7 day course 5-30: Patient doing well, can finish out on p.o. regimen.  Discussed wiping from front to back  LOS: 14 days A FACE TO FACE EVALUATION WAS PERFORMED  Bea Lime 01/11/2024, 10:16 AM

## 2024-01-11 NOTE — Progress Notes (Signed)
 Hypoglycemic Event  CBG: 53  Treatment: 8 oz juice/soda  Symptoms: None  Follow-up CBG: Time:2123 CBG Result:73  Possible Reasons for Event: Unknown  Comments/MD notified:Mercedes Street PA     [address], [place]

## 2024-01-11 NOTE — Progress Notes (Signed)
 Occupational Therapy Weekly Progress Note  Patient Details  Name: Haley Jones MRN: [id number] Date of Birth: 1955/10/27  Beginning of progress report period: 01/04/24 End of progress report period: 01/11/24  Today's Date: 01/11/2024 OT Individual Time: 5784-6962 OT Individual Time Calculation (min): 73 min   Haley Jones continues to work hard toward her OT goals. Her ELOS was extended d/t worse than initially realized R hemineglect, apraxia, and attention/cognitive deficits that make carryover very inconsistent. Her RUE has improved significantly and she is able to functionally incorporate it into ADL tasks. She continues to require max skilled cueing to complete dressing tasks d/t apraxia. Her balance has improved but d/t aforementioned deficits she is still a very high fall risk with frequent LOB's or unexpected stand > sit d/t poor body awareness. She has very poor insight into deficits. Family education is being completed on Monday with her sister to assess ability to provide the level of care Haley Jones will need.   Patient continues to demonstrate the following deficits: muscle weakness, decreased cardiorespiratoy endurance, impaired timing and sequencing, unbalanced muscle activation, motor apraxia, ataxia, decreased coordination, and decreased motor planning, right side neglect, decreased initiation, decreased attention, decreased awareness, decreased problem solving, decreased safety awareness, decreased memory, and delayed processing, and decreased standing balance, decreased postural control, hemiplegia, and decreased balance strategies and therefore will continue to benefit from skilled OT intervention to enhance overall performance with BADL and iADL.  Patient progressing toward long term goals.- these goals were downgraded this week to CGA.  Continue plan of care.  OT Short Term Goals Week 2:  OT Short Term Goal 1 (Week 2): STG=LTG d/t ELOS OT Short Term Goal 1 - Progress (Week 2):  Progressing toward goal Week 3:  OT Short Term Goal 1 (Week 3): STG= LTG d/t ELOS  Skilled Therapeutic Interventions/Progress Updates:    Pt received sitting up with no c/o pain, agreeable to OT session. She stood with CGA and with the RW completed an ambulatory transfer into the bathroom with min A. She demonstrated improvement in RW management and R attention during this mobility today. Min A to transfer to toilet. Min questioning cues for sequencing steps of clothing management prior to sitting on toilet. She voided urine + large BM. She reports she cannot reach around the back with her L arm to complete peri hygiene but with cueing for positioning she was able to today and completed all hygiene with CGA. She transferred into the shower and bathed seated for UB, standing LB. She was able to incorporate her RUE into hair washing, elevating shoulder to 80 degrees with only mild compensatory strategies. She stood to complete LB bathing with min A for thoroughness. She transferred back to the w/c following with min A. She required mod cueing for hemi strategies during dressing. Bra donned with min A to fasten posteriorly. Shirt donned with CGA!! Pants with min A. Huge improvement in dressing apraxia today. She completed hair care and oral care at the sink seated with (S). She completed 125 ft of functional mobility to the therapy gym with the RW with min A. Improved R attention during mobility. She worked on [employer] for the remainder of the session. Worked on crossing midline with min-mod facilitation at the elbow. She was then very briefly placed in the saebo mobile arm support to work on shoulder flexion/abduction with reducing trunk and upper trap compensation with hands on facilitation. She returned to her room and was left sitting up in the w/c with  all needs met, chair alarm set.    Therapy Documentation Precautions:  Precautions Precautions: Fall (R inattention) Recall of Precautions/Restrictions:  Impaired Restrictions Weight Bearing Restrictions Per Provider Order: No  Therapy/Group: Individual Therapy  Haley Jones 01/11/2024, 7:44 AM

## 2024-01-11 NOTE — Progress Notes (Signed)
 Speech Language Pathology Weekly Progress and Session Note  Patient Details  Name: Teliyah Royal MRN: [id number] Date of Birth: 21-Jun-1956  Beginning of progress report period: Jan 04, 2024 End of progress report period: Jan 11, 2024   Short Term Goals: Week 2: SLP Short Term Goal 1 (Week 2): STGs = LTGs d/t ELOS SLP Short Term Goal 1 - Progress (Week 2): Progressing toward goal    New Short Term Goals: Week 3: SLP Short Term Goal 1 (Week 3): STGs = LTGs d/t ELOS  Weekly Progress Updates: Slight progress towards ST goals noted this week. She continues to present w/ moderate cognitive-linguistic deficits overall, but presents w/ emerging success re sustained attention, problem solving, and awareness. Improved word finding/thought formulation noted as well. Exacerbation s/p CVA is possible, however, anticipate reduced mental flexibility and reasoning mostly d/t pt personality given pt report of baseline functioning. D/c date pushed back d/t continued deficits and availability for assistance at home. Memory goal adjusted d/t current progress, otherwise, she remains on track to meet her LTGs by time of d/c. Recommend cont ST to target cognitive-linguistic deficits, maximize pt independence, facilitate return to prev roles/responsibilities, and reduce caregiver burden.    Intensity: Minumum of 1-2 x/day, 30 to 90 minutes Frequency: 3 to 5 out of 7 days Duration/Length of Stay: 6/6 Treatment/Interventions: Cognitive remediation/compensation;Environmental controls;Functional tasks;Internal/external aids;Patient/family education;Speech/Language facilitation;Cueing hierarchy;Therapeutic Activities   [name] 01/11/2024, 7:25 AM

## 2024-01-11 NOTE — Plan of Care (Signed)
 LB dressing goal downgraded 2/2 dressing apraxia.   Problem: RH Dressing Goal: LTG Patient will perform lower body dressing w/assist (OT) Description: LTG: Patient will perform lower body dressing with assist, with/without cues in positioning using equipment (OT) Flowsheets (Taken 01/11/2024 0756) LTG: Pt will perform lower body dressing with assistance level of: (downgraded 5/30- SD) Minimal Assistance - Patient > 75%

## 2024-01-11 NOTE — Progress Notes (Signed)
 Physical Therapy Session Note  Patient Details  Name: Haley Jones MRN: [id number] Date of Birth: 1956/02/02  Today's Date: 01/11/2024 PT Individual Time: 1301-1359 PT Individual Time Calculation (min): 58 min   Short Term Goals: Week 2:  PT Short Term Goal 1 (Week 2): STGs = LTGs  Skilled Therapeutic Interventions/Progress Updates:     Pt received seated in [facility] and agrees to therapy. No complaint of pain. WC transport to gym for time management. Pt stands and ambulates x175' with CGA and cues for increased stride length and reciprocal gait pattern to improve balance, as well as safe sequencing for transition back to WC. Pt then tasked with ambulating same bout while collecting sticky notes in numerical order from 1 to 10, all placed in Rt visual field. Pt completes with RW and requiring minimal cues for attention, but otherwise does a consistent job of locating sticky notes and maintaining balance. Pt also retrieves one sticky note from ground, requiring minA for trunk stability and cues for body mechanics. Following rest break, pt ambulates x175' without AD, with light minA to promote stability as well as lateral weight shifting. Pt then holding 5ln bar weight with BUEs and tasked with standing from WC while holding bar, requiring minA and cues for body mechanics and anterior weight shift. In standing pt performs 2x8 bar raises via bilateral shoulder flexion, then sits back down while holding onto bar, working on core strength and dynamic standing balance. Seated rest break between sets. Pt left seated in WC with alarm intact and all needs within reach.  Therapy Documentation Precautions:  Precautions Precautions: Fall (R inattention) Recall of Precautions/Restrictions: Impaired Restrictions Weight Bearing Restrictions Per Provider Order: No   Therapy/Group: Individual Therapy  Neva Barban, PT, DPT 01/11/2024, 5:01 PM

## 2024-01-11 NOTE — Progress Notes (Signed)
 Occupational Therapy Session Note  Patient Details  Name: Haley Jones MRN: [id number] Date of Birth: [birth year]  Today's Date: 01/11/2024 OT Individual Time: 0830-0900 OT Individual Time Calculation (min): 30 min    Short Term Goals: Week 1:  OT Short Term Goal 1 (Week 1): Patient will complete sit to stand using RW with close S OT Short Term Goal 1 - Progress (Week 1): Not met OT Short Term Goal 2 (Week 1): The pt will complete UB/LB dressing with s/u A for UB and MinA for LB using the hemi technique OT Short Term Goal 2 - Progress (Week 1): Not met OT Short Term Goal 3 (Week 1): The pt will demonstrate increase attention to the Rt Side of the body 80% of the time following intial cues and demostration. OT Short Term Goal 3 - Progress (Week 1): Met OT Short Term Goal 4 (Week 1): The pt will improve bilateral hand coordination by incorporating the RUE as a prime mover 80% of the time OT Short Term Goal 4 - Progress (Week 1): Not met OT Short Term Goal 5 (Week 1): The pt wil partipate in cognitive activities with 80% accuracy involving  memory and with 1-5 steps iinstuction. OT Short Term Goal 5 - Progress (Week 1): Met Week 2:  OT Short Term Goal 1 (Week 2): STG=LTG d/t ELOS OT Short Term Goal 1 - Progress (Week 2): Progressing toward goal Week 3:  OT Short Term Goal 1 (Week 3): STG= LTG d/t ELOS  Skilled Therapeutic Interventions/Progress Updates:    1:1 Pt received in the w/c taking her morning meds. Focus on functional ambulation with RW with visual attention to the right to retrieve sticky notes along the hallway on the right reaching with opportunities to reach with both hands and at different heights (including below waist and above head). Pt required mod cues to retrieve all notes. Also performed ambulating backwards while maintaining balance with RW as well a multiple turns. Also performed zoom ball activity focusing on symmetrical posture and bilateral shoulder adduction/  flexion. Pt left in w/c in prep for next session.   Therapy Documentation Precautions:  Precautions Precautions: Fall (R inattention) Recall of Precautions/Restrictions: Impaired Restrictions Weight Bearing Restrictions Per Provider Order: No General:   Vital Signs: Therapy Vitals Temp: 98.1 F (36.7 C) Pulse Rate: 88 Resp: 18 BP: 114/77 Patient Position (if appropriate): Sitting Oxygen Therapy SpO2: 99 % O2 Device: Room Air Pain:  No c/o pain in session.   Therapy/Group: Individual Therapy  Henrene Locust [facility] 01/11/2024, 1:35 PM

## 2024-01-12 DIAGNOSIS — I63512 Cerebral infarction due to unspecified occlusion or stenosis of left middle cerebral artery: Secondary | ICD-10-CM | POA: Diagnosis not present

## 2024-01-12 LAB — GLUCOSE, CAPILLARY
Glucose-Capillary: 116 mg/dL — ABNORMAL HIGH (ref 70–99)
Glucose-Capillary: 84 mg/dL (ref 70–99)
Glucose-Capillary: 139 mg/dL — ABNORMAL HIGH (ref 70–99)
Glucose-Capillary: 190 mg/dL — ABNORMAL HIGH (ref 70–99)

## 2024-01-12 MED ORDER — INSULIN GLARGINE-YFGN 100 UNIT/ML ~~LOC~~ SOLN
5.0000 [IU] | Freq: Every day | SUBCUTANEOUS | Status: DC
  Administered 2024-01-13 – 2024-01-18 (×6): 5 [IU] via SUBCUTANEOUS
  Filled 2024-01-12 (×6): qty 0.05

## 2024-01-12 NOTE — Plan of Care (Signed)
  Problem: Consults Goal: RH STROKE PATIENT EDUCATION Description: See Patient Education module for education specifics  Outcome: Progressing   Problem: RH BOWEL ELIMINATION Goal: RH STG MANAGE BOWEL WITH ASSISTANCE Description: STG Manage Bowel with Assistance. Outcome: Progressing   Problem: RH BLADDER ELIMINATION Goal: RH STG MANAGE BLADDER WITH ASSISTANCE Description: STG Manage Bladder With supervision Assistance Outcome: Progressing   Problem: RH SKIN INTEGRITY Goal: RH STG SKIN FREE OF INFECTION/BREAKDOWN Description: Manage skin free of infection/breakdown with supervision Outcome: Progressing   Problem: RH SAFETY Goal: RH STG ADHERE TO SAFETY PRECAUTIONS W/ASSISTANCE/DEVICE Description: STG Adhere to Safety Precautions With supervision Assistance/Device. Outcome: Progressing   Problem: RH PAIN MANAGEMENT Goal: RH STG PAIN MANAGED AT OR BELOW PT'S PAIN GOAL Description: <4 w/ prns Outcome: Progressing

## 2024-01-12 NOTE — Progress Notes (Signed)
 PROGRESS NOTE   Subjective/Complaints:  No acute complaints.  No events overnight.  States she is overall doing much better since antibiotic treatment Blood sugars a little bit low last night, 53, otherwise very tightly controlled Multiple bowel movements with bowel prep yesterday.  Nursing reported that, in fact, she had had a bowel movement within 3 days of receiving bowel prep but was still feeling constipated. Low PVRs today, continent!   ROS: Patient denies fever, new vision changes, dizziness, nausea, vomiting, diarrhea,  shortness of breath or chest pain, or mood change. + Right hand tightness-ongoing + Low back pain--ongoing + Urinary incontinence-ongoing.   + Constipation-resolved  Objective:   No results found. No results for input(s): "WBC", "HGB", "HCT", "PLT" in the last 72 hours.   Recent Labs    01/10/24 0715 01/11/24 0718  NA 135  --   K 4.3  --   CL 106  --   CO2 21*  --   GLUCOSE 155*  --   BUN 31*  --   CREATININE 1.40* 1.36*  CALCIUM  9.1  --      Intake/Output Summary (Last 24 hours) at 01/12/2024 1105 Last data filed at 01/12/2024 0726 Gross per 24 hour  Intake 1172 ml  Output --  Net 1172 ml         Physical Exam: Vital Signs Blood pressure 114/73, pulse 73, temperature 98.5 F (36.9 C), temperature source Oral, resp. rate 17, height 4\' 9"  (1.448 m), weight 70.7 kg, SpO2 98%.   General: No apparent distress, sitting up in bedside chair. HEENT: Head is normocephalic, atraumatic, MMM.  Mild strabismus.  Wearing glasses. Neck: Supple without JVD or lymphadenopathy Heart: Reg rate and rhythm. No murmurs rubs or gallops Chest: CTAB, nonlabored breathing Abdomen: Soft, non-tender, non-distended, bowel sounds positive. Extremities: No clubbing, cyanosis, or edema. Pulses are 2+ Psych: Pt's affect is appropriate. Pt is cooperative.  Skin: No apparent skin breakdown, no apparent lesions.  Mild   bruising around IV site.  Neuro:    Mental Status: AAOx4, awake, alert.  + Right-sided hemineglect, --ongoing, moderate + delay in processing, mild.--Ongoing CRANIAL NERVES:  mild right homonymous hemianopsia--unchanged  RUE: 4-/5 Deltoid, 4/5 Biceps, 4/5 Triceps,5-/5 Grip LUE: 5/5 Deltoid, 5/5 Biceps, 5/5 Triceps, 5/5 Grip RLE: HF 4-/5, KE 4+/5, ADF 4+/5, APF 4+/5 LLE: HF 5/5, KE 5/5, ADF 5/5, APF 5/5  Tone: MAS 0 throughout right upper and lower extremity   SENSORY: Normal to touch all 4 extremities   Coordination: Slightly reduced in right upper extremity   MSK: No joint swelling noted. R shoulder ROM <30 degrees  Physical exam unchanged from the above on reexamination 01/12/24    Assessment/Plan: 1. Functional deficits which require 3+ hours per day of interdisciplinary therapy in a comprehensive inpatient rehab setting. Physiatrist is providing close team supervision and 24 hour management of active medical problems listed below. Physiatrist and rehab team continue to assess barriers to discharge/monitor patient progress toward functional and medical goals  Care Tool:  Bathing    Body parts bathed by patient: Right arm, Left arm, Chest, Abdomen, Front perineal area, Buttocks, Right upper leg, Left upper leg, Right lower leg, Left lower  leg, Face         Bathing assist Assist Level: Moderate Assistance - Patient 50 - 74%     Upper Body Dressing/Undressing Upper body dressing   What is the patient wearing?: Pull over shirt    Upper body assist Assist Level: Minimal Assistance - Patient > 75%    Lower Body Dressing/Undressing Lower body dressing      What is the patient wearing?: Incontinence brief, Pants     Lower body assist Assist for lower body dressing: Moderate Assistance - Patient 50 - 74%     Toileting Toileting    Toileting assist Assist for toileting: Moderate Assistance - Patient 50 - 74%     Transfers Chair/bed  transfer  Transfers assist  Chair/bed transfer activity did not occur: Safety/medical concerns  Chair/bed transfer assist level: Contact Guard/Touching assist     Locomotion Ambulation   Ambulation assist      Assist level: Minimal Assistance - Patient > 75% Assistive device: Walker-rolling Max distance: 175'   Walk 10 feet activity   Assist     Assist level: Minimal Assistance - Patient > 75% Assistive device: Walker-rolling   Walk 50 feet activity   Assist    Assist level: Minimal Assistance - Patient > 75% Assistive device: Walker-rolling    Walk 150 feet activity   Assist    Assist level: Minimal Assistance - Patient > 75% Assistive device: Walker-rolling    Walk 10 feet on uneven surface  activity   Assist     Assist level: Minimal Assistance - Patient > 75% Assistive device: Walker-rolling   Wheelchair     Assist Is the patient using a wheelchair?: No             Wheelchair 50 feet with 2 turns activity    Assist            Wheelchair 150 feet activity     Assist          Blood pressure 114/73, pulse 73, temperature 98.5 F (36.9 C), temperature source Oral, resp. rate 17, height 4\' 9"  (1.448 m), weight 70.7 kg, SpO2 98%.  Medical Problem List and Plan: 1. 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 Motor planning difficulty with RUE per therapies. Limited by inattention. Mod A for transfers and ambulation with a walker. Mod A cognition/attention. Insight poor - has trouble being open to assitance.   2.  Antithrombotics: -DVT/anticoagulation:  Pharmaceutical: Lovenox              -antiplatelet therapy: Aspirin  81 mg daily and Plavix  75 mg daily x 3 months then Plavix  alone  3. Pain Management: Tylenol  as needed  - 5/17 occasional  headache control with Tylenol , continue to monitor  5/19: Pain worse nightly, however patient wishes to continue with repositioning and as needed medications.  No additions at this time.  5-22: ?  Possible underlying carpal tunnel syndrome affecting bilateral hand soreness in the morning--may benefit from outpatient EMG  5/23: Limited pain management options due to extensive allergy list.  Add Voltaren  gel to bilateral hands and low back, lidocaine  patch to low back, and aqua thermia as needed  for chronic low back pain, localized  4. Mood/Behavior/Sleep: Effexor  37.5 mg daily, Klonopin  1 mg nightly             -antipsychotic agents: N/A             -Pt reports daughter passed away around the time she was admitted, consider neuropsych consult             -She is not interested in any adjustment to antidepressant medications at this time  5. Neuropsych/cognition: This patient is capable of making decisions on her own behalf.   - 5/20: Would benefit from neuropsych consult; recent death of her daughter --completed 5-23  - 5-27: Therapies reporting patient talking about online boyfriend with some features concerning for catfishing/scam; patient's sister informed  6. Skin/Wound Care: Routine skin checks 7. Fluids/Electrolytes/Nutrition: Routine in and outs with follow-up chemistries 8.  Diabetes mellitus.  Hemoglobin A1c 8.4.  Presently on NovoLog  5 units 3 times daily, Semglee  10 units daily.  Check blood sugars AC and at bedtime.  Diabetic teaching.  Prior to admission patient on Trulicity  0.75 mg weekly, Amaryl  2 mg daily, Glucophage 500 mg in the morning and 1000 mg in the evening, Glucotrol XL 10 mg daily.  Monitor with increased activity CBG (last 3)  5-27: Episode of hypoglycemia this afternoon.  Diabetic coordinator input appreciated, reduced sliding scale insulin  to sensitive and reduce standing Premeal to 5 units.--looking better 5/28 5/31: Recurrent hypoglycemia.  See Premeal insulin , reduce  long-acting to 5 units   Recent Labs    01/11/24 2055 01/11/24 2123 01/12/24 0608  GLUCAP 53* 73 116*    9.  AKI on CKD stage III.  Follow-up chemistries -5-19: BUN/creatinine elevated from 17/1 0.19-26/1.39.  P.o. intake seem adequate, continue to encourage and will give 500 cc bolus today.  Repeat labs in AM.  No obvious medication contributions but may need to just adjust venlafaxine  if worsening. 5/20: BUN/creatinine stable.  Discussed with patient, she states she has mostly been drinking sugar-free sodas.  Agreeable to switching to water and drinking 68 to 50 cc cups today .  Repeat labs 5-2 2 AM 5/22: BUN/creatinine remain stable, on chart review baseline creatinine ranges between 1.2 and 1.6.  She is at her current baseline, continue to monitor. 5/23: Creatinine 1.27.  Within baseline, monitor 5/27: Creatinine 1.57, increased in the setting of UTI.  Will start treatment as below and DC losartan.  Repeat in 2 days. 5/28: Administer 1 L lactated Ringer 's today; repeat labs in a.m.--mild improvement, continue to encourage POs  10.  Permissive hypertension.  Monitor with increased mobility.  Resume home Norvasc  5 mg daily, lisinopril  10 mg daily as needed    01/12/2024    5:49 AM 01/11/2024    8:14 PM 01/11/2024   12:43 PM  Vitals with BMI  Systolic 114 126 829  Diastolic 73 87 77  Pulse 73 86 88  5/18 BP intermittently elevated, if continues consider increase Norvasc .  For now we will continue to monitor as do not want to cause hypotension/hypoperfusion 5-19: BP currently in good range, monitor 5/20: BP elevated overnight, resume norvasc  5 mg daily (no BP meds ordered?) 5/21: Increase Norvasc  to 10 mg daily.  Add as needed hydralazine  25 mg every 8 hours as needed for elevated systolic and diastolic blood pressures.  Give a few days for Norvasc  to work. 5-22: Normotensive.  Monitor. 5-27: Somewhat hypotensive this afternoon, will DC losartan and may need IV fluids tomorrow if no  improvement with infection treatment 5-28: Remains hypotensive, likely secondary to infection/SIRS.  Give 1 L of lactated Ringer 's at 100 cc/h today.  Placed hold parameters for amlodipine . 5/29: BP looking much better. Normotensive.   5-30: BP a little up this morning, will monitor 1 day and if needed resume home lisinopril  5-31: Normotensive, continue current regimen  11.  Hyperlipidemia. Continue Crestor   12.  GERD.  continue Protonix   13.  B12 deficiency             - Outpatient B12 injection.  Try to determine when she had this last  14. Chronic constipation             -Reports BM yesterday             -Miralax  and senokot   -LBM 5/16, patient reports she does not feel constipated and does not have bowel movement every day.  Continue current regimen for now and monitor   5-20: Last bowel movement 5-16, will schedule Senokot S 2 tabs twice daily.  Last bowel movement 5/24--sorbitol  PRN given 5/29  5/30: Unresponsive to sorbitol  yesterday; give bowel prep versus enema today  5-31: Multiple bowel movements with bowel prep; doing much better   15.  Right hand spasticity.  Baclofen  5 mg twice daily added yesterday.  - 5-22: Encouraged patient to use squeeze foam with therapies to activate and stretch hand in the morning.  Will avoid further baclofen  increases due to CKD.  Continue voltaren  gel  5-26: No benefit in tone with baclofen ; will DC due to CKD  5-30 resolved  16. Urinary incontinence/UTI -   Patient not endorsing any symptoms suspicious for UTI--later, therapies endorsed confusion, will get UA with reflex to culture.   - Will get PVRs--May start Myrbetriq if no infection and low. - 5-27: Urinalysis positive for UTI, start p.o. Bactrim  800 mg twice daily for 7 days.  Repeat culture as prior had too many bacteria. 5-28: Hypotensive and appearing dehydrated, cultures pending.  Concern for SIRS.  Will switch from oral Bactrim  to IV ceftriaxone  1 g daily.  IV fluid as  above. 5/29: + e coli, sensitivities back, switch to PO bactrim  800 mg Q12H for 4 days to finish 7 day course 5-30: Patient doing well, can finish out on p.o. regimen.  Discussed wiping from front to back 5-31: PVRs low, improved continence with bowel movement.  Monitor for today.  LOS: 15 days A FACE TO FACE EVALUATION WAS PERFORMED  Bea Lime 01/12/2024, 11:05 AM

## 2024-01-13 DIAGNOSIS — I63512 Cerebral infarction due to unspecified occlusion or stenosis of left middle cerebral artery: Secondary | ICD-10-CM | POA: Diagnosis not present

## 2024-01-13 LAB — GLUCOSE, CAPILLARY
Glucose-Capillary: 129 mg/dL — ABNORMAL HIGH (ref 70–99)
Glucose-Capillary: 107 mg/dL — ABNORMAL HIGH (ref 70–99)
Glucose-Capillary: 200 mg/dL — ABNORMAL HIGH (ref 70–99)
Glucose-Capillary: 219 mg/dL — ABNORMAL HIGH (ref 70–99)

## 2024-01-13 NOTE — Progress Notes (Signed)
 PROGRESS NOTE   Subjective/Complaints:  No acute complaints.  No events overnight.  Looking forward to getting home on 6-6. Remains incontinent b/b, LBM 6/1,large BP stable, vitals look good   ROS: Patient denies fever, new vision changes, dizziness, nausea, vomiting, diarrhea,  shortness of breath or chest pain, or mood change. + Right hand tightness-ongoing + Low back pain--ongoing + Urinary incontinence-ongoing.   + Constipation-resolved  Objective:   No results found. No results for input(s): "WBC", "HGB", "HCT", "PLT" in the last 72 hours.   Recent Labs    01/11/24 0718  CREATININE 1.36*     Intake/Output Summary (Last 24 hours) at 01/13/2024 1045 Last data filed at 01/13/2024 0820 Gross per 24 hour  Intake 598.5 ml  Output --  Net 598.5 ml         Physical Exam: Vital Signs Blood pressure (!) 150/83, pulse 86, temperature 98.7 F (37.1 C), temperature source Oral, resp. rate 18, height 4\' 9"  (1.448 m), weight 70.7 kg, SpO2 99%.   General: No apparent distress, sitting up in bedside chair. HEENT: Head is normocephalic, atraumatic, MMM.  Mild strabismus.  Wearing glasses. Neck: Supple without JVD or lymphadenopathy Heart: Reg rate and rhythm. No murmurs rubs or gallops Chest: CTAB, nonlabored breathing Abdomen: Soft, non-tender, non-distended, bowel sounds positive. Extremities: No clubbing, cyanosis, or edema. Pulses are 2+ Psych: Pt's affect is appropriate. Pt is cooperative.  Skin: No apparent skin breakdown, no apparent lesions. Mild   bruising around IV site.  Neuro:    Mental Status: AAOx4, awake, alert.  + Right-sided hemineglect, --ongoing, moderate + delay in processing, mild.--Ongoing CRANIAL NERVES:  mild right homonymous hemianopsia--unchanged  RUE: 4-/5 Deltoid, 4/5 Biceps, 4/5 Triceps,5-/5 Grip LUE: 5/5 Deltoid, 5/5 Biceps, 5/5 Triceps, 5/5 Grip RLE: HF 4-/5, KE 4+/5, ADF 4+/5, APF  4+/5 LLE: HF 5/5, KE 5/5, ADF 5/5, APF 5/5  Tone: MAS 0 throughout right upper and lower extremity   SENSORY: Normal to touch all 4 extremities   Coordination: Slightly reduced in right upper extremity   MSK: No joint swelling noted. R shoulder ROM <30 degrees  Physical exam unchanged from the above on reexamination 01/13/24    Assessment/Plan: 1. Functional deficits which require 3+ hours per day of interdisciplinary therapy in a comprehensive inpatient rehab setting. Physiatrist is providing close team supervision and 24 hour management of active medical problems listed below. Physiatrist and rehab team continue to assess barriers to discharge/monitor patient progress toward functional and medical goals  Care Tool:  Bathing    Body parts bathed by patient: Right arm, Left arm, Chest, Abdomen, Front perineal area, Buttocks, Right upper leg, Left upper leg, Right lower leg, Left lower leg, Face         Bathing assist Assist Level: Moderate Assistance - Patient 50 - 74%     Upper Body Dressing/Undressing Upper body dressing   What is the patient wearing?: Pull over shirt    Upper body assist Assist Level: Minimal Assistance - Patient > 75%    Lower Body Dressing/Undressing Lower body dressing      What is the patient wearing?: Incontinence brief, Pants     Lower body assist Assist  for lower body dressing: Moderate Assistance - Patient 50 - 74%     Toileting Toileting    Toileting assist Assist for toileting: Moderate Assistance - Patient 50 - 74%     Transfers Chair/bed transfer  Transfers assist  Chair/bed transfer activity did not occur: Safety/medical concerns  Chair/bed transfer assist level: Contact Guard/Touching assist     Locomotion Ambulation   Ambulation assist      Assist level: Minimal Assistance - Patient > 75% Assistive device: Walker-rolling Max distance: 175'   Walk 10 feet activity   Assist     Assist level: Minimal  Assistance - Patient > 75% Assistive device: Walker-rolling   Walk 50 feet activity   Assist    Assist level: Minimal Assistance - Patient > 75% Assistive device: Walker-rolling    Walk 150 feet activity   Assist    Assist level: Minimal Assistance - Patient > 75% Assistive device: Walker-rolling    Walk 10 feet on uneven surface  activity   Assist     Assist level: Minimal Assistance - Patient > 75% Assistive device: Walker-rolling   Wheelchair     Assist Is the patient using a wheelchair?: No             Wheelchair 50 feet with 2 turns activity    Assist            Wheelchair 150 feet activity     Assist          Blood pressure (!) 150/83, pulse 86, temperature 98.7 F (37.1 C), temperature source Oral, resp. rate 18, height 4\' 9"  (1.448 m), weight 70.7 kg, SpO2 99%.  Medical Problem List and Plan: 1. 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 Motor planning difficulty with RUE per therapies. Limited by inattention. Mod A for transfers and ambulation with a walker. Mod A cognition/attention. Insight poor - has trouble being open to assitance.   2.  Antithrombotics: -DVT/anticoagulation:  Pharmaceutical: Lovenox              -antiplatelet therapy: Aspirin  81 mg daily and Plavix  75 mg daily x 3 months then Plavix  alone  3. Pain Management: Tylenol  as needed  - 5/17 occasional headache control with Tylenol , continue to monitor  5/19: Pain worse nightly, however patient wishes to continue with repositioning and as needed medications.  No additions at this time.  5-22: ?  Possible underlying carpal tunnel syndrome affecting bilateral hand soreness in the morning--may benefit from outpatient EMG  5/23: Limited pain management options due to extensive allergy  list.  Add Voltaren  gel to bilateral hands and low back, lidocaine  patch to low back, and aqua thermia as needed for chronic low back pain, localized  4. Mood/Behavior/Sleep: Effexor  37.5 mg daily, Klonopin  1 mg nightly             -antipsychotic agents: N/A             -Pt reports daughter passed away around the time she was admitted, consider neuropsych consult             -She is not interested in any adjustment to antidepressant medications at this time  5. Neuropsych/cognition: This patient is capable of making decisions on her own behalf.   - 5/20: Would benefit from neuropsych consult; recent death of her daughter --completed 5-23  - 5-27: Therapies reporting patient talking about online boyfriend with some features concerning for catfishing/scam; patient's sister informed  6. Skin/Wound Care: Routine skin checks 7. Fluids/Electrolytes/Nutrition: Routine in and outs with follow-up chemistries 8.  Diabetes mellitus.  Hemoglobin A1c 8.4.  Presently on NovoLog  5 units 3 times daily, Semglee  10 units daily.  Check blood sugars AC and at bedtime.  Diabetic teaching.  Prior to admission patient on Trulicity  0.75 mg weekly, Amaryl  2 mg daily, Glucophage 500 mg in the morning and 1000 mg in the evening, Glucotrol XL 10 mg daily.  Monitor with increased activity CBG (last 3)  5-27: Episode of hypoglycemia this afternoon.  Diabetic coordinator input appreciated, reduced sliding scale insulin  to sensitive and reduce standing Premeal to 5 units.--looking better 5/28 5/31: Recurrent hypoglycemia.  See Premeal insulin , reduce long-acting to 5 units  6/1: Better controlled, monitor Recent Labs    01/12/24 1631 01/12/24 2110 01/13/24 0603  GLUCAP 139* 190* 129*    9.  AKI on CKD stage III.  Follow-up chemistries -5-19: BUN/creatinine elevated from 17/1 0.19-26/1.39.  P.o. intake seem adequate, continue to encourage and will give 500 cc bolus today.  Repeat labs in AM.  No obvious medication  contributions but may need to just adjust venlafaxine  if worsening. 5/20: BUN/creatinine stable.  Discussed with patient, she states she has mostly been drinking sugar-free sodas.  Agreeable to switching to water and drinking 68 to 50 cc cups today .  Repeat labs 5-2 2 AM 5/22: BUN/creatinine remain stable, on chart review baseline creatinine ranges between 1.2 and 1.6.  She is at her current baseline, continue to monitor. 5/23: Creatinine 1.27.  Within baseline, monitor 5/27: Creatinine 1.57, increased in the setting of UTI.  Will start treatment as below and DC losartan.  Repeat in 2 days. 5/28: Administer 1 L lactated Ringer 's today; repeat labs in a.m.--mild improvement, continue to encourage POs  10.  Permissive hypertension.  Monitor with increased mobility.  Resume home Norvasc  5 mg daily, lisinopril  10 mg daily as needed    01/13/2024    6:04 AM 01/12/2024    7:41 PM 01/12/2024   12:53 PM  Vitals with BMI  Systolic 150 125 161  Diastolic 83 77 74  Pulse 86 84 90  5/18 BP intermittently elevated, if continues consider increase Norvasc .  For now we will continue to monitor as do not want to cause hypotension/hypoperfusion 5-19: BP currently in good range, monitor 5/20: BP elevated overnight, resume norvasc  5 mg daily (no BP meds ordered?) 5/21: Increase Norvasc  to 10 mg daily.  Add as needed hydralazine  25 mg every 8 hours as needed for elevated systolic and diastolic blood pressures.  Give a few days for Norvasc  to work. 5-22: Normotensive.  Monitor. 5-27: Somewhat hypotensive this afternoon, will DC losartan and may need IV fluids tomorrow if no improvement with infection treatment 5-28: Remains hypotensive, likely secondary to infection/SIRS.  Give 1 L of lactated Ringer 's at 100 cc/h today.  Placed hold parameters for amlodipine . 5/29: BP looking much better. Normotensive.   5-30: BP a little up this morning, will monitor 1 day and if needed resume home lisinopril  5-31-6/1:  Normotensive, continue current regimen  11.  Hyperlipidemia. Continue Crestor   12.  GERD.  continue Protonix   13.  B12 deficiency             -  Outpatient B12 injection.  Try to determine when she had this last  14. Chronic constipation             -Reports BM yesterday             -Miralax  and senokot   -LBM 5/16, patient reports she does not feel constipated and does not have bowel movement every day.  Continue current regimen for now and monitor   5-20: Last bowel movement 5-16, will schedule Senokot S 2 tabs twice daily.  Last bowel movement 5/24--sorbitol  PRN given 5/29  5/30: Unresponsive to sorbitol  yesterday; give bowel prep versus enema today  5-31: Multiple bowel movements with bowel prep; doing much better   15.  Right hand spasticity.  Baclofen  5 mg twice daily added yesterday.  - 5-22: Encouraged patient to use squeeze foam with therapies to activate and stretch hand in the morning.  Will avoid further baclofen  increases due to CKD.  Continue voltaren  gel  5-26: No benefit in tone with baclofen ; will DC due to CKD  5-30 resolved  16. Urinary incontinence/UTI -   Patient not endorsing any symptoms suspicious for UTI--later, therapies endorsed confusion, will get UA with reflex to culture.   - Will get PVRs--May start Myrbetriq if no infection and low. - 5-27: Urinalysis positive for UTI, start p.o. Bactrim  800 mg twice daily for 7 days.  Repeat culture as prior had too many bacteria. 5-28: Hypotensive and appearing dehydrated, cultures pending.  Concern for SIRS.  Will switch from oral Bactrim  to IV ceftriaxone  1 g daily.  IV fluid as above. 5/29: + e coli, sensitivities back, switch to PO bactrim  800 mg Q12H for 4 days to finish 7 day course 5-30: Patient doing well, can finish out on p.o. regimen.  Discussed wiping from front to back 5-31: PVRs low, .  Monitor for today. 6/1: remains incontinent, low PVRs, would consider starting Myrbetriq after she finishes ABX on  Monday  LOS: 16 days A FACE TO FACE EVALUATION WAS PERFORMED  Bea Lime 01/13/2024, 10:45 AM

## 2024-01-13 NOTE — Progress Notes (Signed)
 Patient refused scheduled miralax  last 2 nights due to ongoing bowel movements from bowel treatment on 5/30.

## 2024-01-14 DIAGNOSIS — I63512 Cerebral infarction due to unspecified occlusion or stenosis of left middle cerebral artery: Secondary | ICD-10-CM | POA: Diagnosis not present

## 2024-01-14 LAB — GLUCOSE, CAPILLARY
Glucose-Capillary: 148 mg/dL — ABNORMAL HIGH (ref 70–99)
Glucose-Capillary: 212 mg/dL — ABNORMAL HIGH (ref 70–99)
Glucose-Capillary: 170 mg/dL — ABNORMAL HIGH (ref 70–99)

## 2024-01-14 MED ORDER — GLIMEPIRIDE 2 MG PO TABS
2.0000 mg | ORAL_TABLET | Freq: Every day | ORAL | Status: DC
  Filled 2024-01-14: qty 1

## 2024-01-14 NOTE — Progress Notes (Signed)
 Speech Language Pathology Daily Session Note  Patient Details  Name: Genola Yuille MRN: [id number] Date of Birth: 01/06/1956  Today's Date: 01/14/2024 SLP Individual Time: 1030-1100 SLP Individual Time Calculation (min): 30 min  Short Term Goals: Week 3: SLP Short Term Goal 1 (Week 3): STGs = LTGs d/t ELOS  Skilled Therapeutic Interventions:   Pt and her sister greeted at bedside. She was verbose, though pleasant and overall cooperative throughout tx tasks targeting cognition. She continued visual organization task initiated in prev tx session (5/29), requiring modA verbal cues for R visual attention and minA for sustained attention. She benefited from minA cues for intellectual awareness of current deficits and maxA to recall ST goals in general. Additionally, SLP provided education re pt's overall cognitive-communication skills, need for subsequent assistance with ADLs/IADLs in the home environment, need for 24/7 supervision, safety concerns, and continued ST upon d/c. Her sister verbalized understanding of all education provided and follow up questions were answered. At the end of tx tasks, she was left in her chair with the alarm set and call light within reach. Recommend cont ST per POC.   Pain Pain Assessment Pain Scale: 0-10 Pain Score: 0-No pain  Therapy/Group: Individual Therapy  [name] 01/14/2024, 11:14 AM

## 2024-01-14 NOTE — Progress Notes (Signed)
 Occupational Therapy Session Note  Patient Details  Name: Haley Jones MRN: [id number] Date of Birth: [birth year]   Session 1  Today's Date: 01/14/2024 OT Individual Time: 5784-6962 OT Individual Time Calculation (min): 60 min   Session 2  Today's Date: 01/14/2024 OT Individual Time: 9528-4132 OT Individual Time Calculation (min): 42 min    Short Term Goals: Week 3:  OT Short Term Goal 1 (Week 3): STG= LTG d/t ELOS  Skilled Therapeutic Interventions/Progress Updates:    Session 1 Pt received sitting up with no c/o pain, agreeable to OT session. Family education session completed with pt and her sister Haley Jones. Verbal education provided re fall risk reduction, energy conservation strategies, home carryover of transfer training, ADLs, and IADLs. Demonstration completed for pt performance of UB/LB bathing and dressing at CGA level, toileting hygiene and transfers, and shower transfers. Provided education and demonstration on DME use recommendations at home. Extensive education on safety impacts of R inattention, attention deficits in general and R hemi. Emphasized pt's poor awareness and insight into deficits. Encouraged her sister to come one more time if possible for more hands on training. Pt was left sitting up in the wheelchair with all needs met, chair alarm set, and call bell within reach.   Session 2 Pt received sitting with no c/o pain, agreeable to OT session. She stood from the w/c with CGA and completed 120 ft of functional mobility to the therapy gym. She required CGA overall with frequent cueing for R attention and pt running into things into the hallway with her RW. She worked on Editor, commissioning combo throwing catch. Facilitated arm swing to maximize shoulder flexion. She demonstrated much improved gross coordination and was able to release the beanbag at about 60 degrees flexion. She completed 50 repetitions total for max NMR. She then completed closed chain BUE shoulder  flexion and extension with a 5lb dowel. Within this closed chain she demonstrated much improved shoulder flexion- all the way to 110 degrees. She returned to her room following, 100 ft of functional mobility with CGA using the RW- pt not fatigued and requiring more assist with RW management to maintain straight trajectory. Pt was left sitting up in the wheelchair with all needs met, chair alarm set, and call bell within reach.    Therapy Documentation Precautions:  Precautions Precautions: Fall (R inattention) Recall of Precautions/Restrictions: Impaired Restrictions Weight Bearing Restrictions Per Provider Order: No  Therapy/Group: Individual Therapy  [name] 01/14/2024, 8:31 AM

## 2024-01-14 NOTE — Progress Notes (Signed)
 Physical Therapy Session Note  Patient Details  Name: Haley Jones MRN: [id number] Date of Birth: 06/05/1956  Today's Date: 01/14/2024 PT Individual Time: 0900-0958 PT Individual Time Calculation (min): 58 min   Short Term Goals: Week 2:  PT Short Term Goal 1 (Week 2): STGs = LTGs  Skilled Therapeutic Interventions/Progress Updates:     Pt received seated in [facility] and agrees to therapy. No complaint of pain. Pt's sister present for family education. PT provides update on pt's mobility progress at CIR as well as recommendations for safe mobility following discharge. WC transport to gym for time management. Pt completes ramp navigation and car transfer with CGA and cues for sequencing and safety. Following rest break, pt ambulates x200' with RW and CGA, with cues for increasing Lt stride length and attending to Rt visual field for increased safety awareness. Following rest break, pt completes x12 6 inch steps with CGA and cues for step sequencing and attention to RUE. Pt then ambulates x100' without AD, with minA and cues to increase gait speed to imporve balance. Pt left seated in WC with all needs within reach.   Therapy Documentation Precautions:  Precautions Precautions: Fall (R inattention) Recall of Precautions/Restrictions: Impaired Restrictions Weight Bearing Restrictions Per Provider Order: No   Therapy/Group: Individual Therapy  Neva Barban, PT, DPT 01/14/2024, 5:25 PM

## 2024-01-14 NOTE — Progress Notes (Signed)
 PROGRESS NOTE   Subjective/Complaints:  No acute complaints.  No events overnight.  Looking forward to getting home on 6-6. Remains incontinent b/b, LBM 6/1,large BP stable, vitals look good   ROS: Patient denies fever, new vision changes, dizziness, nausea, vomiting, diarrhea,  shortness of breath or chest pain, or mood change. + Right hand tightness-ongoing + Low back pain--ongoing + Urinary incontinence-ongoing.   + Constipation-resolved  Objective:   No results found. No results for input(s): "WBC", "HGB", "HCT", "PLT" in the last 72 hours.   No results for input(s): "NA", "K", "CL", "CO2", "GLUCOSE", "BUN", "CREATININE", "CALCIUM " in the last 72 hours.    Intake/Output Summary (Last 24 hours) at 01/14/2024 0803 Last data filed at 01/14/2024 0753 Gross per 24 hour  Intake 595.5 ml  Output --  Net 595.5 ml         Physical Exam: Vital Signs Blood pressure 138/78, pulse 85, temperature 97.9 F (36.6 C), resp. rate 18, height 4\' 9"  (1.448 m), weight 70.7 kg, SpO2 97%.  General: No acute distress Mood and affect are appropriate Heart: Regular rate and rhythm no rubs murmurs or extra sounds Lungs: Clear to auscultation, breathing unlabored, no rales or wheezes Abdomen: Positive bowel sounds, soft nontender to palpation, nondistended Extremities: No clubbing, cyanosis, or edema  Mental Status: AAOx4, awake, alert.    RUE: 4-/5 Deltoid, 4/5 Biceps, 4/5 Triceps,5-/5 Grip LUE: 5/5 Deltoid, 5/5 Biceps, 5/5 Triceps, 5/5 Grip RLE: HF 4-/5, KE 4+/5, ADF 4+/5, APF 4+/5 LLE: HF 5/5, KE 5/5, ADF 5/5, APF 5/5  Tone: MAS 0 throughout right upper and lower extremity     Coordination: Slightly reduced in right upper extremity   MSK: No joint swelling noted. R shoulder ROM reduced abd and flexion AROM  Physical exam unchanged from the above on reexamination 01/14/24    Assessment/Plan: 1. Functional deficits which  require 3+ hours per day of interdisciplinary therapy in a comprehensive inpatient rehab setting. Physiatrist is providing close team supervision and 24 hour management of active medical problems listed below. Physiatrist and rehab team continue to assess barriers to discharge/monitor patient progress toward functional and medical goals  Care Tool:  Bathing    Body parts bathed by patient: Right arm, Left arm, Chest, Abdomen, Front perineal area, Buttocks, Right upper leg, Left upper leg, Right lower leg, Left lower leg, Face         Bathing assist Assist Level: Moderate Assistance - Patient 50 - 74%     Upper Body Dressing/Undressing Upper body dressing   What is the patient wearing?: Pull over shirt    Upper body assist Assist Level: Minimal Assistance - Patient > 75%    Lower Body Dressing/Undressing Lower body dressing      What is the patient wearing?: Incontinence brief, Pants     Lower body assist Assist for lower body dressing: Moderate Assistance - Patient 50 - 74%     Toileting Toileting    Toileting assist Assist for toileting: Moderate Assistance - Patient 50 - 74%     Transfers Chair/bed transfer  Transfers assist  Chair/bed transfer activity did not occur: Safety/medical concerns  Chair/bed transfer assist level: Contact Guard/Touching assist  Locomotion Ambulation   Ambulation assist      Assist level: Minimal Assistance - Patient > 75% Assistive device: Walker-rolling Max distance: 175'   Walk 10 feet activity   Assist     Assist level: Minimal Assistance - Patient > 75% Assistive device: Walker-rolling   Walk 50 feet activity   Assist    Assist level: Minimal Assistance - Patient > 75% Assistive device: Walker-rolling    Walk 150 feet activity   Assist    Assist level: Minimal Assistance - Patient > 75% Assistive device: Walker-rolling    Walk 10 feet on uneven surface  activity   Assist     Assist level:  Minimal Assistance - Patient > 75% Assistive device: Walker-rolling   Wheelchair     Assist Is the patient using a wheelchair?: No             Wheelchair 50 feet with 2 turns activity    Assist            Wheelchair 150 feet activity     Assist          Blood pressure 138/78, pulse 85, temperature 97.9 F (36.6 C), resp. rate 18, height 4\' 9"  (1.448 m), weight 70.7 kg, SpO2 97%.  Medical Problem List and Plan: 1. 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  Antithrombotics: -DVT/anticoagulation:  Pharmaceutical: Lovenox              -antiplatelet therapy: Aspirin  81 mg daily and Plavix  75 mg daily x 3 months then Plavix  alone  3. Pain Management: Tylenol  as needed  - 5/17 occasional headache control with Tylenol , continue to monitor  5/19: Pain worse nightly, however patient wishes to continue with repositioning and as needed medications.  No additions at this time.  5-22: ?  Possible underlying carpal tunnel syndrome affecting bilateral hand soreness in the morning--may benefit from outpatient EMG  5/23: Limited pain management options due to extensive allergy list.  Add Voltaren  gel to bilateral hands and low back, lidocaine  patch to low back, and aqua thermia as needed for chronic low back pain, localized  4. Mood/Behavior/Sleep: Effexor  37.5 mg daily, Klonopin  1 mg nightly             -antipsychotic agents: N/A             -Pt reports daughter passed away around the time she was admitted, consider neuropsych consult             -She is not interested in any adjustment to antidepressant medications at this time  5. Neuropsych/cognition: This patient is capable of making decisions on her own behalf.   - 5/20: Would benefit  from neuropsych consult; recent death of her daughter --completed 5-23  - 5-27: Therapies reporting patient talking about online boyfriend with some features concerning for catfishing/scam; patient's sister informed  6. Skin/Wound Care: Routine skin checks 7. Fluids/Electrolytes/Nutrition: Routine in and outs with follow-up chemistries 8.  Diabetes mellitus.  Hemoglobin A1c 8.4.  Presently on NovoLog  5 units 3 times daily, Semglee  10 units daily.  Check blood sugars AC and at bedtime.  Diabetic teaching.  Prior to admission patient on Trulicity  0.75 mg weekly, Amaryl  2 mg daily, Glucophage 500 mg in the morning and 1000 mg in the evening, Glucotrol XL 10 mg daily.  Monitor with increased activity CBG (last 3)  2 elevated reading but these are outliers cont to monitor Recent Labs    01/13/24 1718 01/13/24 2130 01/14/24 0559  GLUCAP 200* 219* 148*    9.  AKI on CKD stage III.  Follow-up chemistries -5-19: BUN/creatinine elevated from 17/1 0.19-26/1.39.  P.o. intake seem adequate, continue to encourage and will give 500 cc bolus today.  Repeat labs in AM.  No obvious medication contributions but may need to just adjust venlafaxine  if worsening. 5/20: BUN/creatinine stable.  Discussed with patient, she states she has mostly been drinking sugar-free sodas.  Agreeable to switching to water and drinking 68 to 50 cc cups today .  Repeat labs 5-2 2 AM 5/22: BUN/creatinine remain stable, on chart review baseline creatinine ranges between 1.2 and 1.6.  She is at her current baseline, continue to monitor. 5/23: Creatinine 1.27.  Within baseline, monitor 5/27: Creatinine 1.57, increased in the setting of UTI.  Will start treatment as below and DC losartan.  Repeat in 2 days. 5/28: Administer 1 L lactated Ringer 's today; repeat labs in a.m.--mild improvement, continue to encourage POs  10.  Permissive hypertension.  Monitor with increased mobility.  Resume home Norvasc  5 mg daily, lisinopril  10 mg daily as  needed    01/14/2024    4:00 AM 01/13/2024    7:33 PM 01/13/2024    1:43 PM  Vitals with BMI  Systolic 138 122 981  Diastolic 78 69 67  Pulse 85 81 84  5/18 BP intermittently elevated, if continues consider increase Norvasc .  For now we will continue to monitor as do not want to cause hypotension/hypoperfusion 5-19: BP currently in good range, monitor 5/20: BP elevated overnight, resume norvasc  5 mg daily (no BP meds ordered?) 5/21: Increase Norvasc  to 10 mg daily.  Add as needed hydralazine  25 mg every 8 hours as needed for elevated systolic and diastolic blood pressures.  Give a few days for Norvasc  to work. 5-22: Normotensive.  Monitor. 5-27: Somewhat hypotensive this afternoon, will DC losartan and may need IV fluids tomorrow if no improvement with infection treatment 5-28: Remains hypotensive, likely secondary to infection/SIRS.  Give 1 L of lactated Ringer 's at 100 cc/h today.  Placed hold parameters for amlodipine . 5/29: BP looking much better. Normotensive.   5-30: BP a little up this morning, will monitor 1 day and if needed resume home lisinopril  5-31-6/1: Normotensive, continue current regimen  11.  Hyperlipidemia. Continue Crestor   12.  GERD.  continue Protonix   13.  B12 deficiency             - Outpatient B12 injection.  Try to determine when she had this last  14. Chronic constipation             -Reports BM yesterday             -Miralax  and senokot   -LBM 5/16, patient reports she does not feel constipated and does not have bowel movement every day.  Continue current regimen for now and monitor   5-20: Last bowel movement 5-16, will schedule Senokot S 2 tabs twice daily.  Last bowel movement 5/24--sorbitol  PRN given 5/29  5/30: Unresponsive to sorbitol  yesterday; give bowel prep versus enema today  5-31: Multiple bowel movements with bowel prep; doing much better  15.  Right hand spasticity.  Baclofen  5 mg twice daily added yesterday.  - 5-22: Encouraged patient to  use squeeze foam with therapies to activate and stretch hand in the morning.  Will avoid further baclofen  increases due to CKD.  Continue voltaren  gel  5-26: No benefit in tone with baclofen ; will DC due to CKD  5-30 resolved  16. Urinary incontinence/UTI -   Patient not endorsing any symptoms suspicious for UTI--later, therapies endorsed confusion, will get UA with reflex to culture.   - Will get PVRs--May start Myrbetriq if no infection and low. - 5-27: Urinalysis positive for UTI, start p.o. Bactrim  800 mg twice daily for 7 days.  Repeat culture as prior had too many bacteria. 5-28: Hypotensive and appearing dehydrated, cultures pending.  Concern for SIRS.  Will switch from oral Bactrim  to IV ceftriaxone  1 g daily.  IV fluid as above. 5/29: + e coli, sensitivities back, switch to PO bactrim  800 mg Q12H for 4 days to finish 7 day course 5-30: Patient doing well, can finish out on p.o. regimen.  Discussed wiping from front to back 5-31: PVRs low, .  Monitor for today. 6/1: remains incontinent, low PVRs, would consider starting Myrbetriq after she finishes ABX on Monday No incont documented LOS: 17 days A FACE TO FACE EVALUATION WAS PERFORMED  Haley Jones 01/14/2024, 8:03 AM

## 2024-01-15 DIAGNOSIS — I63512 Cerebral infarction due to unspecified occlusion or stenosis of left middle cerebral artery: Secondary | ICD-10-CM | POA: Diagnosis not present

## 2024-01-15 LAB — GLUCOSE, CAPILLARY
Glucose-Capillary: 123 mg/dL — ABNORMAL HIGH (ref 70–99)
Glucose-Capillary: 267 mg/dL — ABNORMAL HIGH (ref 70–99)
Glucose-Capillary: 130 mg/dL — ABNORMAL HIGH (ref 70–99)
Glucose-Capillary: 168 mg/dL — ABNORMAL HIGH (ref 70–99)

## 2024-01-15 LAB — BASIC METABOLIC PANEL WITH GFR
Anion gap: 7 (ref 5–15)
BUN: 33 mg/dL — ABNORMAL HIGH (ref 8–23)
CO2: 25 mmol/L (ref 22–32)
Calcium: 9.3 mg/dL (ref 8.9–10.3)
Chloride: 106 mmol/L (ref 98–111)
Creatinine, Ser: 1.54 mg/dL — ABNORMAL HIGH (ref 0.44–1.00)
GFR, Estimated: 37 mL/min — ABNORMAL LOW (ref >60)
Glucose, Bld: 142 mg/dL — ABNORMAL HIGH (ref 70–99)
Potassium: 4.5 mmol/L (ref 3.5–5.1)
Sodium: 138 mmol/L (ref 135–145)

## 2024-01-15 MED ORDER — MIRABEGRON ER 25 MG PO TB24
25.0000 mg | ORAL_TABLET | Freq: Every day | ORAL | Status: DC
  Administered 2024-01-15 – 2024-01-17 (×3): 25 mg via ORAL
  Filled 2024-01-15 (×2): qty 1

## 2024-01-15 MED ORDER — LIDOCAINE 5 % EX PTCH
1.0000 | MEDICATED_PATCH | CUTANEOUS | Status: DC
  Administered 2024-01-15 – 2024-01-17 (×3): 1 via TRANSDERMAL
  Filled 2024-01-15 (×3): qty 1

## 2024-01-15 NOTE — Progress Notes (Signed)
 Patient ID: Haley Jones, female   DOB: 1956/02/02, 68 y.o.   MRN: [id number]  SW met with pt in room to provide updates from team conference, confirm d/c date remains 6/6, and discuss D/c recs- outpatient and DME- youth RW, 3in1 BSC, and shower chair. SW discussed family edu tomorrow. Pt reports her sister has an appointment, so not able to come tomorrow. She is aware SW will follow-up with her.   Norval Been, MSW, LCSW Office: [phone] Cell: [phone] Fax: [phone]

## 2024-01-15 NOTE — Progress Notes (Signed)
 PROGRESS NOTE   Subjective/Complaints:  No acute complaints.  No events overnight.  Did have some dysuria last night and into this morning, which she states is new.  Continues with urgency and incontinence.  No fevers, chills.  ROS: Patient denies fever, new vision changes, dizziness, nausea, vomiting, diarrhea,  shortness of breath or chest pain, or mood change. + Urinary incontinence-ongoing.   + Dysuria  Objective:   No results found. No results for input(s): "WBC", "HGB", "HCT", "PLT" in the last 72 hours.   Recent Labs    01/15/24 0512  NA 138  K 4.5  CL 106  CO2 25  GLUCOSE 142*  BUN 33*  CREATININE 1.54*  CALCIUM  9.3      Intake/Output Summary (Last 24 hours) at 01/15/2024 0928 Last data filed at 01/15/2024 0800 Gross per 24 hour  Intake 712 ml  Output --  Net 712 ml         Physical Exam: Vital Signs Blood pressure 104/70, pulse 74, temperature 97.9 F (36.6 C), temperature source Oral, resp. rate 17, height 4\' 9"  (1.448 m), weight 70.7 kg, SpO2 100%.  General: No acute distress.  Sitting up in bedside chair.   HEENT: Strabismus, obvious visual deficits.  Complicated by left hemineglect.  No obvious field cut as prior reported.  Mood and affect are appropriate Heart: Regular rate and rhythm no rubs murmurs or extra sounds Lungs: Clear to auscultation, breathing unlabored, no rales or wheezes Abdomen: Positive bowel sounds, soft nontender to palpation, nondistended Extremities: No clubbing, cyanosis, or edema  Mental Status: AAOx4, awake, alert.    RUE: 4-/5 Deltoid, 4/5 Biceps, 4/5 Triceps,5-/5 Grip LUE: 5/5 Deltoid, 5/5 Biceps, 5/5 Triceps, 5/5 Grip RLE: HF 4-/5, KE 4+/5, ADF 4+/5, APF 4+/5 LLE: HF 5/5, KE 5/5, ADF 5/5, APF 5/5  Tone: MAS 0 throughout right upper and lower extremity   Coordination: Slightly reduced in right upper extremity   MSK: No joint swelling noted. R shoulder  ROM reduced abd and flexion AROM     Assessment/Plan: 1. Functional deficits which require 3+ hours per day of interdisciplinary therapy in a comprehensive inpatient rehab setting. Physiatrist is providing close team supervision and 24 hour management of active medical problems listed below. Physiatrist and rehab team continue to assess barriers to discharge/monitor patient progress toward functional and medical goals  Care Tool:  Bathing    Body parts bathed by patient: Right arm, Left arm, Chest, Abdomen, Front perineal area, Buttocks, Right upper leg, Left upper leg, Right lower leg, Left lower leg, Face         Bathing assist Assist Level: Moderate Assistance - Patient 50 - 74%     Upper Body Dressing/Undressing Upper body dressing   What is the patient wearing?: Pull over shirt    Upper body assist Assist Level: Minimal Assistance - Patient > 75%    Lower Body Dressing/Undressing Lower body dressing      What is the patient wearing?: Incontinence brief, Pants     Lower body assist Assist for lower body dressing: Moderate Assistance - Patient 50 - 74%     Toileting Toileting    Toileting assist Assist for toileting:  Moderate Assistance - Patient 50 - 74%     Transfers Chair/bed transfer  Transfers assist  Chair/bed transfer activity did not occur: Safety/medical concerns  Chair/bed transfer assist level: Contact Guard/Touching assist     Locomotion Ambulation   Ambulation assist      Assist level: Minimal Assistance - Patient > 75% Assistive device: Walker-rolling Max distance: 175'   Walk 10 feet activity   Assist     Assist level: Minimal Assistance - Patient > 75% Assistive device: Walker-rolling   Walk 50 feet activity   Assist    Assist level: Minimal Assistance - Patient > 75% Assistive device: Walker-rolling    Walk 150 feet activity   Assist    Assist level: Minimal Assistance - Patient > 75% Assistive device:  Walker-rolling    Walk 10 feet on uneven surface  activity   Assist     Assist level: Minimal Assistance - Patient > 75% Assistive device: Walker-rolling   Wheelchair     Assist Is the patient using a wheelchair?: No             Wheelchair 50 feet with 2 turns activity    Assist            Wheelchair 150 feet activity     Assist          Blood pressure 104/70, pulse 74, temperature 97.9 F (36.6 C), temperature source Oral, resp. rate 17, height 4\' 9"  (1.448 m), weight 70.7 kg, SpO2 100%.  Medical Problem List and Plan: 1. 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 Mild linguistic deficits. At [facility] to Min A level ADLs. BSC + shower chair needed. OP PT/OT/SLP at discharge  2.  Antithrombotics: -DVT/anticoagulation:  Pharmaceutical: Lovenox              -antiplatelet therapy: Aspirin  81 mg daily and Plavix  75 mg daily x 3 months then Plavix  alone  3. Pain Management: Tylenol  as needed  - 5/17 occasional headache control with Tylenol , continue to monitor  5/19: Pain worse nightly, however patient wishes to continue with repositioning and as needed medications.  No additions at this time.  5-22: ?  Possible underlying carpal tunnel syndrome affecting bilateral hand soreness in the morning--may benefit from outpatient EMG  5/23: Limited pain management options due to extensive allergy list.  Add Voltaren  gel to bilateral hands and low back, lidocaine  patch to low back, and aqua thermia as needed for chronic low back pain, localized--well-controlled on current regimen  4. Mood/Behavior/Sleep: Effexor  37.5 mg daily, Klonopin  1 mg nightly              -antipsychotic agents: N/A             -Pt reports daughter passed away around the time she was admitted, consider neuropsych consult             -She is not interested in any adjustment to antidepressant medications at this time  5. Neuropsych/cognition: This patient is capable of making decisions on her own behalf.   -  5/20: Would benefit from neuropsych consult; recent death of her daughter --completed 5-23  - 5-27: Therapies reporting patient talking about online boyfriend with some features concerning for catfishing/scam; patient's sister informed  6. Skin/Wound Care: Routine skin checks 7. Fluids/Electrolytes/Nutrition: Routine in and outs with follow-up chemistries 8.  Diabetes mellitus.  Hemoglobin A1c 8.4.  Presently on NovoLog  5 units 3 times daily, Semglee  10 units daily.  Check blood sugars AC and at bedtime.  Diabetic teaching.  Prior to admission patient on Trulicity  0.75 mg weekly, Amaryl  2 mg daily, Glucophage 500 mg in the morning and 1000 mg in the evening, Glucotrol XL 10 mg daily.  Monitor with increased activity CBG (last 3)  2 elevated reading but these are outliers cont to monitor Recent Labs    01/14/24 1646 01/14/24 2111 01/15/24 0622  GLUCAP 212* 170* 123*    9.  AKI on CKD stage III.  Follow-up chemistries -5-19: BUN/creatinine elevated from 17/1 0.19-26/1.39.  P.o. intake seem adequate, continue to encourage and will give 500 cc bolus today.  Repeat labs in AM.  No obvious medication contributions but may need to just adjust venlafaxine  if worsening. 5/20: BUN/creatinine stable.  Discussed with patient, she states she has mostly been drinking sugar-free sodas.  Agreeable to switching to water and drinking 68 to 50 cc cups today .  Repeat labs 5-2 2 AM 5/22: BUN/creatinine remain stable, on chart review baseline creatinine ranges between 1.2 and 1.6.  She is at her current baseline, continue to monitor. 5/23: Creatinine 1.27.  Within baseline, monitor 5/27:  Creatinine 1.57, increased in the setting of UTI.  Will start treatment as below and DC losartan.  Repeat in 2 days. 5/28: Administer 1 L lactated Ringer 's today; repeat labs in a.m.--mild improvement, continue to encourage POs 6/3: Cr elevated today 1.58 but within her normal; encourage fluids  10.  Permissive hypertension.  Monitor with increased mobility.  Resume home Norvasc  5 mg daily, lisinopril  10 mg daily as needed    01/15/2024    5:26 AM 01/14/2024    7:47 PM 01/14/2024   12:47 PM  Vitals with BMI  Systolic 104 116 604  Diastolic 70 69 72  Pulse 74 79 85  5/18 BP intermittently elevated, if continues consider increase Norvasc .  For now we will continue to monitor as do not want to cause hypotension/hypoperfusion 5-19: BP currently in good range, monitor 5/20: BP elevated overnight, resume norvasc  5 mg daily (no BP meds ordered?) 5/21: Increase Norvasc  to 10 mg daily.  Add as needed hydralazine  25 mg every 8 hours as needed for elevated systolic and diastolic blood pressures.  Give a few days for Norvasc  to work. 5-22: Normotensive.  Monitor. 5-27: Somewhat hypotensive this afternoon, will DC losartan and may need IV fluids tomorrow if no improvement with infection treatment 5-28: Remains hypotensive, likely secondary to infection/SIRS.  Give 1 L of lactated Ringer 's at 100 cc/h today.  Placed hold parameters for amlodipine . 5/29: BP looking much better. Normotensive.   5-30: BP a little up this morning, will monitor 1 day and if needed resume home lisinopril  -- Normotensive, continue current regimen  11.  Hyperlipidemia. Continue Crestor   12.  GERD.  continue Protonix   13.  B12 deficiency             - Outpatient B12 injection.  Try to determine when she had this last  14. Chronic constipation             -Reports BM yesterday             -  Miralax  and senokot   -LBM 5/16, patient reports she does not feel constipated and does not have bowel movement every day.  Continue current  regimen for now and monitor   5-20: Last bowel movement 5-16, will schedule Senokot S 2 tabs twice daily.  Last bowel movement 5/24--sorbitol  PRN given 5/29  5/30: Unresponsive to sorbitol  yesterday; give bowel prep versus enema today  5-31: Multiple bowel movements with bowel prep; doing much better  Last bowel movement 6/2   15.  Right hand spasticity.  Baclofen  5 mg twice daily added yesterday.  - 5-22: Encouraged patient to use squeeze foam with therapies to activate and stretch hand in the morning.  Will avoid further baclofen  increases due to CKD.  Continue voltaren  gel  5-26: No benefit in tone with baclofen ; will DC due to CKD  5-30 resolved  16. Urinary incontinence/UTI -   Patient not endorsing any symptoms suspicious for UTI--later, therapies endorsed confusion, will get UA with reflex to culture.   - Will get PVRs--May start Myrbetriq if no infection and low. - 5-27: Urinalysis positive for UTI, start p.o. Bactrim  800 mg twice daily for 7 days.  Repeat culture as prior had too many bacteria. 5-28: Hypotensive and appearing dehydrated, cultures pending.  Concern for SIRS.  Will switch from oral Bactrim  to IV ceftriaxone  1 g daily.  IV fluid as above. 5/29: + e coli, sensitivities back, switch to PO bactrim  800 mg Q12H for 4 days to finish 7 day course 5-30: Patient doing well, can finish out on p.o. regimen.  Discussed wiping from front to back 5-31: PVRs low, .  Monitor for today. 6/1: remains incontinent, low PVRs, would consider starting Myrbetriq after she finishes ABX on Monday 6/3: Mixed continence, low PVRs - start myrbetriq 25 mg daily-will get UA to recheck given dysuria but low suspicion for recurrent UTI   LOS: 18 days A FACE TO FACE EVALUATION WAS PERFORMED  Bea Lime 01/15/2024, 9:28 AM

## 2024-01-15 NOTE — Progress Notes (Signed)
 Speech Language Pathology Daily Session Note  Patient Details  Name: Haley Jones MRN: [id number] Date of Birth: Jul 25, 1956  Today's Date: 01/15/2024 SLP Individual Time: 0900-1000 SLP Individual Time Calculation (min): 60 min  Short Term Goals: Week 3: SLP Short Term Goal 1 (Week 3): STGs = LTGs d/t ELOS  Skilled Therapeutic Interventions:   Pt greeted at bedside after PT tx session. She was up in her wheelchair upon SLP arrival, agreeable to tx tasks targeting cognition and language. She required minA cues to recall mildly detailed info re events from yesterday afternoon, evening, and this morning. She requested assistance with completion of visual organization task from yesterday, and required s visual/verbal cues overall. She then completed mildly complex time calculation task w/ maxA. Anticipate baseline difficulties w/ math and subsequent anxiety negatively impacted success, however, she still required cues for problem solving, information processing, and working memory. After the task, she benefited from modA cues for awareness and planning re IADL completion/assistance. During specific word finding task (10 word association), she required only s cues for naming. At the end of tx tasks, she was left in her chair with the call light within reach. Recommend cont ST per POC.   Pain  No pain   Therapy/Group: Individual Therapy  [name] 01/15/2024, 9:45 AM

## 2024-01-15 NOTE — Progress Notes (Signed)
 Occupational Therapy Session Note  Patient Details  Name: Haley Jones MRN: [id number] Date of Birth: [birth year]  Today's Date: 01/15/2024 OT Individual Time: 1033-1100 OT Individual Time Calculation (min): 27 min    Short Term Goals: Week 2:  OT Short Term Goal 1 (Week 2): STG=LTG d/t ELOS OT Short Term Goal 1 - Progress (Week 2): Progressing toward goal  Skilled Therapeutic Interventions/Progress Updates:    Pt received sitting in the w/c with no c/o pain, agreeable to OT session. Stayed in the room for session per pt request. She worked on [employer] within closed chain functional reaching using a ball. Worked in all planes of movement and OT provided min-mod facilitation at the elbow to reduce compensation and increase shoulder flexion to 80 degrees. She then worked on gravity reduced shoulder slides using a slideboard, she was able to bring her shoulder to 90 degrees with this activity. Pt was left sitting up in the wheelchair with all needs met, chair alarm set, and call bell within reach.    Therapy Documentation Precautions:  Precautions Precautions: Fall (R inattention) Recall of Precautions/Restrictions: Impaired Restrictions Weight Bearing Restrictions Per Provider Order: No  Therapy/Group: Individual Therapy  [name] 01/15/2024, 10:50 AM

## 2024-01-15 NOTE — Plan of Care (Signed)
  Problem: Consults Goal: RH STROKE PATIENT EDUCATION Description: See Patient Education module for education specifics  Outcome: Progressing   Problem: RH BOWEL ELIMINATION Goal: RH STG MANAGE BOWEL WITH ASSISTANCE Description: STG Manage Bowel with Assistance. Outcome: Progressing   Problem: RH BLADDER ELIMINATION Goal: RH STG MANAGE BLADDER WITH ASSISTANCE Description: STG Manage Bladder With supervision Assistance Outcome: Progressing   Problem: RH SKIN INTEGRITY Goal: RH STG SKIN FREE OF INFECTION/BREAKDOWN Description: Manage skin free of infection/breakdown with supervision Outcome: Progressing   Problem: RH SAFETY Goal: RH STG ADHERE TO SAFETY PRECAUTIONS W/ASSISTANCE/DEVICE Description: STG Adhere to Safety Precautions With supervision Assistance/Device. Outcome: Progressing   Problem: RH PAIN MANAGEMENT Goal: RH STG PAIN MANAGED AT OR BELOW PT'S PAIN GOAL Description: <4 w/ prns Outcome: Progressing   Problem: RH KNOWLEDGE DEFICIT Goal: RH STG INCREASE KNOWLEDGE OF DIABETES Description: Manage increase knowledge of diabetes with supervision from sister using educational materials provided Outcome: Progressing Goal: RH STG INCREASE KNOWLEDGE OF HYPERTENSION Description: Manage increase knowledge of hypertension with supervision from sister using educational materials provided Outcome: Progressing Goal: RH STG INCREASE KNOWLEGDE OF HYPERLIPIDEMIA Description: Manage increase knowledge of hyperlipidemia with supervision from sister using educational materials provided Outcome: Progressing Goal: RH STG INCREASE KNOWLEDGE OF STROKE PROPHYLAXIS Description: Manage increase knowledge of stroke prophylaxis  with supervision from sister using educational materials provided Outcome: Progressing   Problem: Consults Goal: RH STROKE PATIENT EDUCATION Description: See Patient Education module for education specifics  Outcome: Progressing

## 2024-01-15 NOTE — Progress Notes (Signed)
 Physical Therapy Weekly Progress Note  Patient Details  Name: Haley Jones MRN: [id number] Date of Birth: 1956/03/14  Beginning of progress report period: Jan 07, 2024 End of progress report period: January 15, 2024  Today's Date: 01/15/2024 PT Individual Time: 1308-6578 PT Individual Time Calculation (min): 43 min   Today's Date: 01/15/2024 PT Individual Time: 1302-1359 PT Individual Time Calculation (min): 57 min   Patient has met  0 of 0 short term goals. Pt did not have any short term goals due to length of stay being extended. Pt is improving toward mobility goals overall, with increased independence with bed mobility, balance, transfers, and ambulation. Pt's mobility and safety are most affected by pt's Rt sided inattention and apraxia, which has improved significantly over past reporting period. Pt has had hands on family education.   Patient continues to demonstrate the following deficits muscle weakness, decreased cardiorespiratoy endurance, motor apraxia and decreased coordination, decreased attention to right, decreased attention, decreased awareness, decreased problem solving, and decreased safety awareness, and decreased sitting balance, decreased standing balance, decreased postural control, hemiplegia, and decreased balance strategies and therefore will continue to benefit from skilled PT intervention to increase functional independence with mobility.  Patient progressing toward long term goals..  Continue plan of care.  PT Short Term Goals Week 2:  PT Short Term Goal 1 (Week 2): STGs = LTGs Week 3:  PT Short Term Goal 1 (Week 3): STGs = LTGs  Skilled Therapeutic Interventions/Progress Updates:  Ambulation/gait training;Community reintegration;DME/adaptive equipment instruction;Neuromuscular re-education;Stair training;UE/LE Strength taining/ROM;UE/LE Coordination activities;Visual/perceptual remediation/compensation;Therapeutic Exercise;Patient/family education;Functional  mobility training;Balance/vestibular training;Therapeutic Activities   1st Session: Pt received seated in [facility] and agrees to therapy. No complaint of pain. WC transport to gym. Pt participates in high level gait training with obstacle course and RW. Pt tasked with performing sidestepping to the Rt and Lt to maneuver through obstacles to simulate home environment and accessing bathroom. Pt initially performs straight line sidestepping with CGA and cues for sequencing and safe AD management. Pt progresses to sidestepping through turns to challenge ability to adjust path and attend to Rt side. Pt requires frequent minA with more difficulty task, with cues to attend to Rt side and utilize vision for feedback. With repeated repetitions, pt improves performance to completing with CGA and consistent cueing. WC transport back to room. Left seated with all needs within reach.   2nd Session: Pt received seated in [facility] and agrees to therapy. No complaint of pain. WC transport to gym for time management. Stand step to Nustep with close supervision and RW, with cues for positioning and safety. Pt completes Nustep for reciprocal coordination and challenging Rt sided attention. Pt completes x15:00 at workload of 5 with average steps per minute ~40. PT provides cues for hand and foot placement and completing full available ROM. Pt requires periodic cueing to increase amplitude of movements, but overall has increased amplitude relative to previous sessions. Following, pt completes NMR for standing balance and Rt sided attention, tasked with performing foot taps on colored latex circles, initially with RW and CGA, and progressing to no AD with minA at trunk and RUE for stability. Pt successfully completes single step commands consistently and 2-step commands ~50% of time. PT provides cues for posture and lateral weight shifting. WC transport back to room. Left in [facility] with all needs within reach.   Therapy Documentation Precautions:   Precautions Precautions: Fall (R inattention) Recall of Precautions/Restrictions: Impaired Restrictions Weight Bearing Restrictions Per Provider Order: No   Therapy/Group: Individual  Therapy  Neva Barban, PT, DPT 01/15/2024, 8:37 AM

## 2024-01-15 NOTE — Patient Care Conference (Signed)
 Inpatient RehabilitationTeam Conference and Plan of Care Update Date: 01/15/2024   Time: 1006 am    Patient Name: Haley Jones      Medical Record Number: [id number]  Date of Birth: 12-11-1955 Sex: Female         Room/Bed: 4W19C/4W19C-01 Payor Info: Payor: [employer] MEDICARE / Plan: [employer] MEDICARE CHOICE PPO / Product Type: *No Product type* /    Admit Date/Time:  12/28/2023  3:16 PM  Primary Diagnosis:  Left middle cerebral artery stroke [facility])  Hospital Problems: Principal Problem:   Left middle cerebral artery stroke [facility]) Active Problems:   Depression with anxiety    Expected Discharge Date: Expected Discharge Date: 01/18/24  Team Members Present: Physician leading conference: Dr. Cherri Corns Social Worker Present: Norval Been, LCSW Nurse Present: Jerene Monks, RN PT Present: Theodis Fiscal, PT OT Present: Eilene Grater, OT SLP Present: Tally Faes, SLP     Current Status/Progress Goal Weekly Team Focus  Bowel/Bladder      Incontinent of bowel and bladder; dysuria and urgency;UTI    Maintain continence of bowel and bladder    Assess bowel and bladder  q shift   Swallow/Nutrition/ Hydration               ADL's   Family education completed. Bathing improved to a CGA level. Dressing min A overall UB/LB- approaching CGA. ADL transfers at CGA overall. RUE flexion to 70 degrees, fully functional during bathing and dressing. Inattention and awareness/insight still limiting   CGA overall   Continued family edu if needed, d/c planning , balance, apraxia, RUE NMR, ADLs    Mobility   supervision bed mobility, transfers, gait with RW x175'   mod(I) transfers, supervision ambulation  Rt sided attention, balance, ambulation, DC prep    Communication                Safety/Cognition/ Behavioral Observations  moderate cog deficits: attention, memory, problem solving - severe awareness/planning, mild specific word finding   min-modA   pt/family education,  functional cognitive tasks, cog re training, compensatory strategy training    Pain      No pain noted  <4 w/ prns     Assess pain q shift  Skin      No skin issues  No skin issues entire rehab stay     Assess kin q shift    Discharge Planning:  Pt will d/c to home with support from her sister assisting, and possible support from a friend. fam edu on 6/2 9am-12pm. SW will confirm there are no barriers to discharge.    Team Discussion: Patient was admitted post left MCA infarction/occlusion. Patient limited by apraxia, right inattention, very poor insight/ awareness ,mild-mod cognitive deficits and resistance for help at home.   Patient on target to meet rehab goals: yes, currently patient needs minimal assistance with ADLs and supervision with transfers. Patient is able to ambulate up to 175' using a rolling walker. Moderate cognitive deficits. Overall goals at discharge are set for supervision-CGA.  *See Care Plan and progress notes for long and short-term goals.   Revisions to Treatment Plan:   Neuro psych consult   Teaching Needs: Safety, medications, dietary modifications, toileting, diabetic teaching, transfers, etc    Current Barriers to Discharge: Decreased caregiver support, Home enviroment access/layout, and Incontinence  Possible Resolutions to Barriers: Family education DME: RW, BSC, shower chair Outpatient follow -up    Medical Summary Current Status: medically complicated by L inattention, resolving UTI, HTN, CKD, R  shoulder weakness/?RTC tear, vision deficits, urinary incontinence  Barriers to Discharge: Behavior/Mood;Medical stability;Self-care education;Uncontrolled Hypertension;Uncontrolled Diabetes;Renal Insufficiency/Failure;Uncontrolled Pain   Possible Resolutions to [employer] Focus: PVRs/timed toiletting for incontinence and start Myrbetriq, re-check UA for ongoing dysuria, encourage PO fluids and adjust meds for BP/AKI, adjust insulin  for  hyperglycemia   Continued Need for Acute Rehabilitation Level of Care: The patient requires daily medical management by a physician with specialized training in physical medicine and rehabilitation for the following reasons: Direction of a multidisciplinary physical rehabilitation program to maximize functional independence : Yes Medical management of patient stability for increased activity during participation in an intensive rehabilitation regime.: Yes Analysis of laboratory values and/or radiology reports with any subsequent need for medication adjustment and/or medical intervention. : Yes   I attest that I was present, lead the team conference, and concur with the assessment and plan of the team.   Mika Griffitts Gayo 01/15/2024, 1006 am

## 2024-01-16 DIAGNOSIS — I63512 Cerebral infarction due to unspecified occlusion or stenosis of left middle cerebral artery: Secondary | ICD-10-CM | POA: Diagnosis not present

## 2024-01-16 LAB — GLUCOSE, CAPILLARY
Glucose-Capillary: 153 mg/dL — ABNORMAL HIGH (ref 70–99)
Glucose-Capillary: 175 mg/dL — ABNORMAL HIGH (ref 70–99)
Glucose-Capillary: 183 mg/dL — ABNORMAL HIGH (ref 70–99)
Glucose-Capillary: 183 mg/dL — ABNORMAL HIGH (ref 70–99)

## 2024-01-16 LAB — URINALYSIS, W/ REFLEX TO CULTURE (INFECTION SUSPECTED)
Bacteria, UA: NONE SEEN
Bilirubin Urine: NEGATIVE
Glucose, UA: NEGATIVE mg/dL
Hgb urine dipstick: NEGATIVE
Ketones, ur: NEGATIVE mg/dL
Leukocytes,Ua: NEGATIVE
Nitrite: NEGATIVE
Protein, ur: NEGATIVE mg/dL
Specific Gravity, Urine: 1.025 (ref 1.005–1.030)
pH: 6 (ref 5.0–8.0)

## 2024-01-16 NOTE — Progress Notes (Signed)
 Urine sample collected and sent to lab per MD order.   Randeen Busman, LPN

## 2024-01-16 NOTE — Progress Notes (Signed)
 Patient ID: Haley Jones, female   DOB: Aug 19, 1955, 68 y.o.   MRN: [id number]   [phone]- SW spoke with pt sister [name] to provide updates from team conference, d/c date remains 6/6, D/c recs- outpatient PT/OT/SLP, and DME- youth RW, 3in1 BSC, and shower chair. Preferred outpatient location is [facility]. SW will order DME. Pt sister would like to use [facility] pharmacy and then they will switch to [facility] outpatient pharmacy in [place].   SW ordered DME with [employer] via parachute. SW faxed outpatient PT/OT/SLP referral to [facility] location.   Norval Been, MSW, LCSW Office: [phone] Cell: [phone] Fax: [phone]

## 2024-01-16 NOTE — Progress Notes (Signed)
 Occupational Therapy Session Note  Patient Details  Name: Haley Jones MRN: [id number] Date of Birth: 07-29-1956  Today's Date: 01/16/2024 OT Individual Time: 1100-1205 OT Individual Time Calculation (min): 65 min    Short Term Goals: Week 2:  OT Short Term Goal 1 (Week 2): STG=LTG d/t ELOS OT Short Term Goal 1 - Progress (Week 2): Progressing toward goal  Skilled Therapeutic Interventions/Progress Updates:    Pt received sitting in the w/c with no c/o pain, agreeable to OT session. She completed 100 ft of functional mobility with the RW to the therapy gym with close (S). She completed dynamic balance and RUE functional reaching focused activity, walking without a device 25 ft to a door, grasping a squig with her R hand and returning it to the mat, another 25 ft. This challenged RUE functional reaching as well as dynamic balance and functional activity tolerance. She required min facilitation at the RUE to reach 90 degrees of flexion. She demonstrated 75 degrees of active shoulder flexion. She completed 10 trials total with frequent rest breaks. She completed 7 repetitions without the RW and had on instance of a moderate LOB that required OT intervention to prevent fall. She reported some ankle pain initially but by end of session reported no pain or limitations. She ended with high intensity RUE shoulder flexion/ext using a Nustep handle but using just the RUE. She required min-mod cueing for attention to the R and fully extending RUE. 5 min completed, short rest break and then another 7 min. She ended session with 200 ft of functional mobility with the RW with min cueing for R attention, CGA overall. Pt was left sitting up in the wheelchair with all needs met, chair alarm set, and call bell within reach.    Therapy Documentation Precautions:  Precautions Precautions: Fall (R inattention) Recall of Precautions/Restrictions: Impaired Restrictions Weight Bearing Restrictions Per  Provider Order: No Therapy/Group: Individual Therapy  [name] 01/16/2024, 8:01 AM

## 2024-01-16 NOTE — Progress Notes (Signed)
 PROGRESS NOTE   Subjective/Complaints:  No events overnight.  No acute complaints.  Normotensive. PVRs low starting Myrbetriq, does feel like this has helped a little bit.  Did have continent bowel movement and continent urinary movement this a.m. Urinalysis not repeated yesterday.  She says the dysuria has mostly resolved.  ROS: Patient denies fever, new vision changes, dizziness, nausea, vomiting, diarrhea,  shortness of breath or chest pain, or mood change. + Urinary incontinence-ongoing.   + Dysuria--improved  Objective:   No results found. No results for input(s): "WBC", "HGB", "HCT", "PLT" in the last 72 hours.   Recent Labs    01/15/24 0512  NA 138  K 4.5  CL 106  CO2 25  GLUCOSE 142*  BUN 33*  CREATININE 1.54*  CALCIUM  9.3      Intake/Output Summary (Last 24 hours) at 01/16/2024 0825 Last data filed at 01/16/2024 0800 Gross per 24 hour  Intake 952 ml  Output --  Net 952 ml         Physical Exam: Vital Signs Blood pressure 132/87, pulse 79, temperature 97.6 F (36.4 C), resp. rate 17, height 4\' 9"  (1.448 m), weight 70.7 kg, SpO2 96%.  General: No acute distress.  Sitting upright in therapy gym.  HEENT: Strabismus, obvious visual deficits.  Complicated by left hemineglect.  No obvious field cut as prior reported.  Mood and affect are appropriate Heart: Regular rate and rhythm no rubs murmurs or extra sounds Lungs: Clear to auscultation, breathing unlabored, no rales or wheezes Abdomen: Positive bowel sounds, soft nontender to palpation, nondistended Extremities: No clubbing, cyanosis, or edema  Mental Status: AAOx4, awake, alert.    RUE: 4-/5 Deltoid, 4/5 Biceps, 4/5 Triceps,5-/5 Grip LUE: 5/5 Deltoid, 5/5 Biceps, 5/5 Triceps, 5/5 Grip RLE: HF 4-/5, KE 4+/5, ADF 4+/5, APF 4+/5 LLE: HF 5/5, KE 5/5, ADF 5/5, APF 5/5  Tone: MAS 0 throughout right upper and lower extremity   Coordination:  Slightly reduced in right upper extremity   MSK: No joint swelling noted. R shoulder ROM reduced abd and flexion AROM-- 20 degrees  Ambulation: Heavy leftward lean/inattention, ambulates with walker with contact-guard to min assist intermittently for wandering.    Assessment/Plan: 1. Functional deficits which require 3+ hours per day of interdisciplinary therapy in a comprehensive inpatient rehab setting. Physiatrist is providing close team supervision and 24 hour management of active medical problems listed below. Physiatrist and rehab team continue to assess barriers to discharge/monitor patient progress toward functional and medical goals  Care Tool:  Bathing    Body parts bathed by patient: Right arm, Left arm, Chest, Abdomen, Front perineal area, Buttocks, Right upper leg, Left upper leg, Right lower leg, Left lower leg, Face         Bathing assist Assist Level: Moderate Assistance - Patient 50 - 74%     Upper Body Dressing/Undressing Upper body dressing   What is the patient wearing?: Pull over shirt    Upper body assist Assist Level: Minimal Assistance - Patient > 75%    Lower Body Dressing/Undressing Lower body dressing      What is the patient wearing?: Incontinence brief, Pants     Lower body assist  Assist for lower body dressing: Moderate Assistance - Patient 50 - 74%     Toileting Toileting    Toileting assist Assist for toileting: Moderate Assistance - Patient 50 - 74%     Transfers Chair/bed transfer  Transfers assist  Chair/bed transfer activity did not occur: Safety/medical concerns  Chair/bed transfer assist level: Contact Guard/Touching assist     Locomotion Ambulation   Ambulation assist      Assist level: Minimal Assistance - Patient > 75% Assistive device: Walker-rolling Max distance: 175'   Walk 10 feet activity   Assist     Assist level: Minimal Assistance - Patient > 75% Assistive device: Walker-rolling   Walk 50  feet activity   Assist    Assist level: Minimal Assistance - Patient > 75% Assistive device: Walker-rolling    Walk 150 feet activity   Assist    Assist level: Minimal Assistance - Patient > 75% Assistive device: Walker-rolling    Walk 10 feet on uneven surface  activity   Assist     Assist level: Minimal Assistance - Patient > 75% Assistive device: Walker-rolling   Wheelchair     Assist Is the patient using a wheelchair?: No             Wheelchair 50 feet with 2 turns activity    Assist            Wheelchair 150 feet activity     Assist          Blood pressure 132/87, pulse 79, temperature 97.6 F (36.4 C), resp. rate 17, height 4\' 9"  (1.448 m), weight 70.7 kg, SpO2 96%.  Medical Problem List and Plan: 1. 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 Mild linguistic deficits. At CGA to Min A level ADLs. BSC + shower chair needed. OP PT/OT/SLP at discharge  2.  Antithrombotics: -DVT/anticoagulation:  Pharmaceutical: Lovenox              -antiplatelet therapy: Aspirin  81 mg daily and Plavix  75 mg daily x 3 months then Plavix  alone  3. Pain Management: Tylenol  as needed  - 5/17 occasional headache control with Tylenol , continue to monitor  5/19: Pain worse nightly, however patient wishes to continue with repositioning and as needed medications.  No additions at this time.  5-22: ?  Possible underlying carpal tunnel syndrome affecting bilateral hand soreness in the morning--may benefit from outpatient EMG  5/23: Limited pain management options due to extensive allergy list.  Add Voltaren  gel to bilateral hands and low back, lidocaine   patch to low back, and aqua thermia as needed for chronic low back pain, localized--well-controlled on current regimen  4. Mood/Behavior/Sleep: Effexor  37.5 mg daily, Klonopin  1 mg nightly             -antipsychotic agents: N/A             -Pt reports daughter passed away around the time she was admitted, consider neuropsych consult             -She is not interested in  any adjustment to antidepressant medications at this time  5. Neuropsych/cognition: This patient is capable of making decisions on her own behalf.   - 5/20: Would benefit from neuropsych consult; recent death of her daughter --completed 5-23  - 5-27: Therapies reporting patient talking about online boyfriend with some features concerning for catfishing/scam; patient's sister informed  6. Skin/Wound Care: Routine skin checks 7. Fluids/Electrolytes/Nutrition: Routine in and outs with follow-up chemistries 8.  Diabetes mellitus.  Hemoglobin A1c 8.4.  Presently on NovoLog  5 units 3 times daily, Semglee  10 units daily.  Check blood sugars AC and at bedtime.  Diabetic teaching.  Prior to admission patient on Trulicity  0.75 mg weekly, Amaryl  2 mg daily, Glucophage 500 mg in the morning and 1000 mg in the evening, Glucotrol XL 10 mg daily.  Monitor with increased activity CBG (last 3)  2 elevated reading but these are outliers cont to monitor Recent Labs    01/15/24 1647 01/15/24 2144 01/16/24 0637  GLUCAP 130* 168* 153*    9.  AKI on CKD stage III.  Follow-up chemistries -5-19: BUN/creatinine elevated from 17/1 0.19-26/1.39.  P.o. intake seem adequate, continue to encourage and will give 500 cc bolus today.  Repeat labs in AM.  No obvious medication contributions but may need to just adjust venlafaxine  if worsening. 5/20: BUN/creatinine stable.  Discussed with patient, she states she has mostly been drinking sugar-free sodas.  Agreeable to switching to water and drinking 68 to 50 cc cups today .  Repeat labs 5-2 2 AM 5/22:  BUN/creatinine remain stable, on chart review baseline creatinine ranges between 1.2 and 1.6.  She is at her current baseline, continue to monitor. 5/23: Creatinine 1.27.  Within baseline, monitor 5/27: Creatinine 1.57, increased in the setting of UTI.  Will start treatment as below and DC losartan.  Repeat in 2 days. 5/28: Administer 1 L lactated Ringer 's today; repeat labs in a.m.--mild improvement, continue to encourage POs 6/3: Cr elevated today 1.58 but within her normal; encourage fluids  10.  Permissive hypertension.  Monitor with increased mobility.  Resume home Norvasc  5 mg daily, lisinopril  10 mg daily as needed    01/16/2024    6:39 AM 01/15/2024    7:52 PM 01/15/2024    1:12 PM  Vitals with BMI  Systolic 132 131 846  Diastolic 87 70 78  Pulse 79 79 89  5/18 BP intermittently elevated, if continues consider increase Norvasc .  For now we will continue to monitor as do not want to cause hypotension/hypoperfusion 5-19: BP currently in good range, monitor 5/20: BP elevated overnight, resume norvasc  5 mg daily (no BP meds ordered?) 5/21: Increase Norvasc  to 10 mg daily.  Add as needed hydralazine  25 mg every 8 hours as needed for elevated systolic and diastolic blood pressures.  Give a few days for Norvasc  to work. 5-22: Normotensive.  Monitor. 5-27: Somewhat hypotensive this afternoon, will DC losartan and may need IV fluids tomorrow if no improvement with infection treatment 5-28: Remains hypotensive, likely secondary to infection/SIRS.  Give 1 L of lactated Ringer 's at 100 cc/h today.  Placed hold parameters for amlodipine . 5/29: BP looking much better. Normotensive.   5-30: BP a little up this morning, will monitor 1 day and if needed resume home lisinopril  -- Normotensive, continue current regimen  11.  Hyperlipidemia. Continue Crestor   12.  GERD.  continue Protonix   13.  B12 deficiency             - Outpatient B12 injection.  Try  to determine when she had this last  14.  Chronic constipation             -Reports BM yesterday             -Miralax  and senokot   -LBM 5/16, patient reports she does not feel constipated and does not have bowel movement every day.  Continue current regimen for now and monitor   5-20: Last bowel movement 5-16, will schedule Senokot S 2 tabs twice daily.  Last bowel movement 5/24--sorbitol  PRN given 5/29  5/30: Unresponsive to sorbitol  yesterday; give bowel prep versus enema today  5-31: Multiple bowel movements with bowel prep; doing much better  Last bowel movement 6/2   15.  Right hand spasticity.  Baclofen  5 mg twice daily added yesterday.  - 5-22: Encouraged patient to use squeeze foam with therapies to activate and stretch hand in the morning.  Will avoid further baclofen  increases due to CKD.  Continue voltaren  gel  5-26: No benefit in tone with baclofen ; will DC due to CKD  5-30 resolved  16. Urinary incontinence/UTI -   Patient not endorsing any symptoms suspicious for UTI--later, therapies endorsed confusion, will get UA with reflex to culture.   - Will get PVRs--May start Myrbetriq if no infection and low. - 5-27: Urinalysis positive for UTI, start p.o. Bactrim  800 mg twice daily for 7 days.  Repeat culture as prior had too many bacteria. 5-28: Hypotensive and appearing dehydrated, cultures pending.  Concern for SIRS.  Will switch from oral Bactrim  to IV ceftriaxone  1 g daily.  IV fluid as above. 5/29: + e coli, sensitivities back, switch to PO bactrim  800 mg Q12H for 4 days to finish 7 day course 5-30: Patient doing well, can finish out on p.o. regimen.  Discussed wiping from front to back 5-31: PVRs low, .  Monitor for today. 6/1: remains incontinent, low PVRs, would consider starting Myrbetriq after she finishes ABX on Monday 6/3: Mixed continence, low PVRs - start myrbetriq 25 mg daily-will get UA to recheck given dysuria but low suspicion for recurrent UTI 6-4: Tolerating Myrbetriq, continue PVRs.  UA  pending.   LOS: 19 days A FACE TO FACE EVALUATION WAS PERFORMED  Bea Lime 01/16/2024, 8:25 AM

## 2024-01-16 NOTE — Progress Notes (Signed)
 Physical Therapy Session Note  Patient Details  Name: Haley Jones MRN: [id number] Date of Birth: 1956-07-01  Today's Date: 01/16/2024 PT Individual Time: 0850-0945 PT Individual Time Calculation (min): 55 min   Short Term Goals: Week 3:  PT Short Term Goal 1 (Week 3): STGs = LTGs  Skilled Therapeutic Interventions/Progress Updates:     Pt received seated in [facility] and agrees to therapy. No complaint of pain. WC transport to gym. Pt stands with CGA and cues for initiation, then ambulates without AD to work on dynamic standing balance and NMR for Rt hemibody. Pt ambulates 175' with minA at trunk and hips to promote stability and upright posture, with cues to increase stride length and gait speed to improve balance. PT also provides cueing for safe sequencing of transition back to [facility], with pt demonstrating good carryover from previous sessions and good safety awareness. Seated rest break. Pt then ambulates x175' in opposite direciton with PT positioned on pt's Left side. Pt noted to have increased Rt sided lean and requires consistent minA to correct for bias. PT also cues to increase trunk rotation and arm swing to improve balance. Pt then ambulates x175' with 2lb weights in each hand to promote increased trunk rotation and arm swing. Pt has improved gait pattern with weights but requires modA for transition back to [facility] with decreased safety and sequencing. Pt ambualtes through obstacle course requiring her to make 180 degree turns multiple times and complete sequence of tasks in specific order. Pt completes with minA overall and with improved ability to sequence tasks relative to previous sessions. Pt then completes 2x10 foot taps on 8 inch step with minA and cues for lateral weight shifting. Pt left seated in WC with all needs within reach.   Therapy Documentation Precautions:  Precautions Precautions: Fall (R inattention) Recall of Precautions/Restrictions: Impaired Restrictions Weight  Bearing Restrictions Per Provider Order: No   Therapy/Group: Individual Therapy  Neva Barban, PT, DPT 01/16/2024, 5:11 PM

## 2024-01-16 NOTE — Progress Notes (Signed)
 Speech Language Pathology Daily Session Note  Patient Details  Name: Haley Jones MRN: [id number] Date of Birth: April 07, 1956  Today's Date: 01/16/2024 SLP Individual Time: 1400-1500 SLP Individual Time Calculation (min): 60 min  Short Term Goals: Week 3: SLP Short Term Goal 1 (Week 3): STGs = LTGs d/t ELOS  Skilled Therapeutic Interventions:   Pt greeted at bedside. She was awake, scrolling on her phone upon SLP arrival. She was pleasant and cooperative throughout tx tasks targeting cognition and language. She benefited from additional processing time and modA for problem solving during following written directions task. She also completed a visual organization task ID single words from a fo 30. Provided w/ additional time to complete, she required only s cues to visualize items x2 on the R side. She then completed a verbal specific word finding task w/ category/letter and benefited from s cues d/t specific nature of items. At the end of tx tasks, she was left in her chair with the alarm set and call light within reach. Recommend cont ST.   Pain Pain Assessment Pain Scale: 0-10 Pain Score: 4  Pain Location: Back Pain Intervention(s): Medication (See eMAR);Repositioned;Hot/Cold interventions  Therapy/Group: Individual Therapy  [name] 01/16/2024, 2:09 PM

## 2024-01-16 NOTE — Plan of Care (Signed)
  Problem: Consults Goal: RH STROKE PATIENT EDUCATION Description: See Patient Education module for education specifics  Outcome: Progressing   Problem: RH BOWEL ELIMINATION Goal: RH STG MANAGE BOWEL WITH ASSISTANCE Description: STG Manage Bowel with Assistance. Outcome: Progressing   Problem: RH BLADDER ELIMINATION Goal: RH STG MANAGE BLADDER WITH ASSISTANCE Description: STG Manage Bladder With supervision Assistance Outcome: Progressing   Problem: RH SKIN INTEGRITY Goal: RH STG SKIN FREE OF INFECTION/BREAKDOWN Description: Manage skin free of infection/breakdown with supervision Outcome: Progressing   Problem: RH SAFETY Goal: RH STG ADHERE TO SAFETY PRECAUTIONS W/ASSISTANCE/DEVICE Description: STG Adhere to Safety Precautions With supervision Assistance/Device. Outcome: Progressing   Problem: RH PAIN MANAGEMENT Goal: RH STG PAIN MANAGED AT OR BELOW PT'S PAIN GOAL Description: <4 w/ prns Outcome: Progressing   Problem: RH KNOWLEDGE DEFICIT Goal: RH STG INCREASE KNOWLEDGE OF DIABETES Description: Manage increase knowledge of diabetes with supervision from sister using educational materials provided Outcome: Progressing Goal: RH STG INCREASE KNOWLEDGE OF HYPERTENSION Description: Manage increase knowledge of hypertension with supervision from sister using educational materials provided Outcome: Progressing Goal: RH STG INCREASE KNOWLEGDE OF HYPERLIPIDEMIA Description: Manage increase knowledge of hyperlipidemia with supervision from sister using educational materials provided Outcome: Progressing Goal: RH STG INCREASE KNOWLEDGE OF STROKE PROPHYLAXIS Description: Manage increase knowledge of stroke prophylaxis  with supervision from sister using educational materials provided Outcome: Progressing   Problem: Consults Goal: RH STROKE PATIENT EDUCATION Description: See Patient Education module for education specifics  Outcome: Progressing

## 2024-01-16 NOTE — Progress Notes (Signed)
 Occupational Therapy Discharge Summary  Patient Details  Name: Haley Jones MRN: [id number] Date of Birth: 1955/11/27  Date of Discharge from OT service:January 17, 2024   Patient has met 8 of 8 long term goals due to improved activity tolerance, improved balance, postural control, ability to compensate for deficits, functional use of  RIGHT upper and RIGHT lower extremity, improved attention, improved awareness, and improved coordination.  Patient to discharge at overall CGA level.  Patient's care partner is independent to provide the necessary physical and cognitive assistance at discharge. Haley Jones has made great progress in CIR and progressed to a CGA level. Family education has been completed with her sister Haley Jones who is aware of ongoing RUE inattention and hemiplegia. She has ongoing reduced awareness and insight into deficits that limits her safety and requires 24/7 supervision.   Recommendation:  Patient will benefit from ongoing skilled OT services in outpatient setting to continue to advance functional skills in the area of BADL and iADL.  Equipment: BSC  Reasons for discharge: treatment goals met and discharge from hospital  Patient/family agrees with progress made and goals achieved: Yes  OT Discharge Precautions/Restrictions  Restrictions Weight Bearing Restrictions Per Provider Order: No  ADL ADL Equipment Provided:  (none) Eating: Set up Where Assessed-Eating: Wheelchair Grooming: Setup Where Assessed-Grooming: Sitting at sink Upper Body Bathing: Supervision/safety (close S) Where Assessed-Upper Body Bathing: Shower Lower Body Bathing: Supervision/safety (close S sitting) Where Assessed-Lower Body Bathing: Shower Upper Body Dressing: Minimal assistance Where Assessed-Upper Body Dressing: Wheelchair Lower Body Dressing: Moderate assistance Where Assessed-Lower Body Dressing: Wheelchair Toileting: Minimal assistance, Supervision/safety (for hyigene only) Where  Assessed-Toileting: Other (Comment) (based on functional performance with bathing UB/LB) Toilet Transfer: Minimal assistance Toilet Transfer Method: Proofreader: Grab bars, Other (comment) (Based on observation for coming from sit to stand using the grab bars and the arm of the w/c) Tub/Shower Transfer: Contact guard, Minimal assistance Tub/Shower Transfer Method: Ship broker: Shower seat with back, Acupuncturist: Contact guard, Minimal assistance Film/video editor Method: Manufacturing systems engineer with back Vision Baseline Vision/History: 1 Wears glasses Patient Visual Report: No change from baseline Vision Assessment?: Yes Eye Alignment: Impaired (comment) (strabismus at baseline) Ocular Range of Motion: Within Functional Limits Alignment/Gaze Preference: Within Defined Limits Tracking/Visual Pursuits: Decreased smoothness of horizontal tracking;Decreased smoothness of vertical tracking Convergence: Impaired - to be further tested in functional context Additional Comments: No visual cut in assessment Perception  Perception: Impaired Perception-Other Comments: R inattention Praxis Praxis: Impaired Praxis Impairment Details: Motor planning Praxis-Other Comments: Dressing apraxia Cognition Cognition Overall Cognitive Status: Impaired/Different from baseline Arousal/Alertness: Awake/alert Memory: Impaired Memory Impairment: Decreased recall of new information Attention: Selective Selective Attention: Impaired Selective Attention Impairment: Verbal complex;Functional complex Awareness: Impaired Awareness Impairment: Emergent impairment Initiating: Appears intact Safety/Judgment: Impaired Comments: R inattention, decreased awareness and insight into deficits Brief Interview for Mental Status (BIMS) Repetition of Three Words (First Attempt): 3 Temporal Orientation: Year:  Correct Temporal Orientation: Month: Accurate within 5 days Recall: "Sock": Yes, no cue required Recall: "Blue": Yes, after cueing ("a color") Recall: "Bed": No, could not recall BIMS Summary Score: 99 Sensation Sensation Light Touch: Appears Intact Coordination Gross Motor Movements are Fluid and Coordinated: No Fine Motor Movements are Fluid and Coordinated: No Coordination and Movement Description: R inattention and hemiplegia Finger Nose Finger Test: Overshoots Motor  Motor Motor: Hemiplegia Motor - Skilled Clinical Observations: R side weakness, inattention Mobility  Bed Mobility Bed Mobility: Supine to Sit;Sit to  Supine Supine to Sit: Supervision/Verbal cueing Sit to Supine: Supervision/Verbal cueing Transfers Sit to Stand: Contact Guard/Touching assist Stand to Sit: Contact Guard/Touching assist  Trunk/Postural Assessment  Cervical Assessment Cervical Assessment: Within Functional Limits Thoracic Assessment Thoracic Assessment: Within Functional Limits Lumbar Assessment Lumbar Assessment: Within Functional Limits Postural Control Postural Control: Deficits on evaluation Righting Reactions: delayed  Balance Balance Balance Assessed: Yes Static Sitting Balance Static Sitting - Balance Support: Feet supported Static Sitting - Level of Assistance: 6: Modified independent (Device/Increase time) Dynamic Sitting Balance Dynamic Sitting - Balance Support: Feet supported Dynamic Sitting - Level of Assistance: 6: Modified independent (Device/Increase time) Static Standing Balance Static Standing - Balance Support: During functional activity;Bilateral upper extremity supported Static Standing - Level of Assistance: 4: Min assist (CGA) Dynamic Standing Balance Dynamic Standing - Balance Support: During functional activity;Bilateral upper extremity supported Dynamic Standing - Level of Assistance: 4: Min assist (CGA) Extremity/Trunk Assessment RUE Assessment RUE  Assessment: Exceptions to [facility] Passive Range of Motion (PROM) Comments: WFL Active Range of Motion (AROM) Comments: 75 degrees active shoulder flexion, full IR/ER. She reports this is baseline LUE Assessment LUE Assessment: Within Functional Limits   Haley Jones 01/16/2024, 11:24 AM

## 2024-01-16 NOTE — Plan of Care (Signed)
  Problem: Consults Goal: RH STROKE PATIENT EDUCATION Description: See Patient Education module for education specifics  Outcome: Progressing   Problem: RH BOWEL ELIMINATION Goal: RH STG MANAGE BOWEL WITH ASSISTANCE Description: STG Manage Bowel with Assistance. Outcome: Progressing   Problem: RH BLADDER ELIMINATION Goal: RH STG MANAGE BLADDER WITH ASSISTANCE Description: STG Manage Bladder With supervision Assistance Outcome: Progressing   Problem: RH SKIN INTEGRITY Goal: RH STG SKIN FREE OF INFECTION/BREAKDOWN Description: Manage skin free of infection/breakdown with supervision Outcome: Progressing   Problem: RH SAFETY Goal: RH STG ADHERE TO SAFETY PRECAUTIONS W/ASSISTANCE/DEVICE Description: STG Adhere to Safety Precautions With supervision Assistance/Device. Outcome: Progressing

## 2024-01-17 ENCOUNTER — Other Ambulatory Visit (HOSPITAL_COMMUNITY): Payer: Self-pay

## 2024-01-17 ENCOUNTER — Telehealth (HOSPITAL_COMMUNITY): Payer: Self-pay | Admitting: Pharmacy Technician

## 2024-01-17 DIAGNOSIS — I63512 Cerebral infarction due to unspecified occlusion or stenosis of left middle cerebral artery: Secondary | ICD-10-CM | POA: Diagnosis not present

## 2024-01-17 LAB — GLUCOSE, CAPILLARY
Glucose-Capillary: 157 mg/dL — ABNORMAL HIGH (ref 70–99)
Glucose-Capillary: 219 mg/dL — ABNORMAL HIGH (ref 70–99)
Glucose-Capillary: 126 mg/dL — ABNORMAL HIGH (ref 70–99)
Glucose-Capillary: 152 mg/dL — ABNORMAL HIGH (ref 70–99)

## 2024-01-17 MED ORDER — AMLODIPINE BESYLATE 10 MG PO TABS
10.0000 mg | ORAL_TABLET | Freq: Every day | ORAL | 0 refills | Status: AC
  Filled 2024-01-17: qty 30, 30d supply, fill #0

## 2024-01-17 MED ORDER — MIRABEGRON ER 50 MG PO TB24: 50.0000 mg | ORAL_TABLET | Freq: Every day | ORAL | 0 refills | Status: DC

## 2024-01-17 MED ORDER — LIDOCAINE 5 % EX PTCH
1.0000 | MEDICATED_PATCH | CUTANEOUS | 0 refills | Status: AC
  Filled 2024-01-17: qty 30, 30d supply, fill #0

## 2024-01-17 MED ORDER — PANTOPRAZOLE SODIUM 40 MG PO TBEC
40.0000 mg | DELAYED_RELEASE_TABLET | Freq: Every day | ORAL | 0 refills | Status: AC
  Filled 2024-01-17: qty 30, 30d supply, fill #0

## 2024-01-17 MED ORDER — CLOPIDOGREL BISULFATE 75 MG PO TABS
75.0000 mg | ORAL_TABLET | Freq: Every day | ORAL | 0 refills | Status: AC
  Filled 2024-01-17: qty 30, 30d supply, fill #0

## 2024-01-17 MED ORDER — MIRABEGRON ER 25 MG PO TB24
25.0000 mg | ORAL_TABLET | Freq: Every day | ORAL | 0 refills | Status: DC
  Filled 2024-01-17: qty 30, 30d supply, fill #0

## 2024-01-17 MED ORDER — CLONAZEPAM 1 MG PO TABS
1.0000 mg | ORAL_TABLET | Freq: Every day | ORAL | 0 refills | Status: AC
  Filled 2024-01-17: qty 30, 30d supply, fill #0

## 2024-01-17 MED ORDER — POLYETHYLENE GLYCOL 3350 17 G PO PACK: 17.0000 g | PACK | Freq: Every day | ORAL | Status: AC

## 2024-01-17 MED ORDER — VENLAFAXINE HCL ER 37.5 MG PO CP24
37.5000 mg | ORAL_CAPSULE | Freq: Every day | ORAL | 0 refills | Status: AC
Start: 2024-01-17 — End: ?
  Filled 2024-01-17: qty 30, 30d supply, fill #0

## 2024-01-17 MED ORDER — MECLIZINE HCL 12.5 MG PO TABS
12.5000 mg | ORAL_TABLET | Freq: Two times a day (BID) | ORAL | 0 refills | Status: AC
  Filled 2024-01-17: qty 60, 30d supply, fill #0

## 2024-01-17 MED ORDER — JARDIANCE 10 MG PO TABS
10.0000 mg | ORAL_TABLET | Freq: Every day | ORAL | 0 refills | Status: AC
  Filled 2024-01-17: qty 30, 30d supply, fill #0

## 2024-01-17 MED ORDER — TRULICITY 0.75 MG/0.5ML ~~LOC~~ SOAJ
0.7500 mg | SUBCUTANEOUS | 0 refills | Status: AC
  Filled 2024-01-17: qty 2, 28d supply, fill #0

## 2024-01-17 MED ORDER — MIRABEGRON ER 50 MG PO TB24
50.0000 mg | ORAL_TABLET | Freq: Every day | ORAL | 0 refills | Status: DC
Start: 2024-01-18 — End: 2024-01-17
  Filled 2024-01-17: qty 30, 30d supply, fill #0

## 2024-01-17 MED ORDER — CALCIUM CARBONATE-VITAMIN D 600-400 MG-UNIT PO TABS
2.0000 | ORAL_TABLET | Freq: Every day | ORAL | 0 refills | Status: AC
  Filled 2024-01-17: qty 30, 15d supply, fill #0

## 2024-01-17 MED ORDER — ESTRADIOL 1 MG PO TABS
1.0000 mg | ORAL_TABLET | Freq: Every day | ORAL | 0 refills | Status: AC
  Filled 2024-01-17: qty 30, 30d supply, fill #0

## 2024-01-17 MED ORDER — MIRABEGRON ER 50 MG PO TB24
50.0000 mg | ORAL_TABLET | Freq: Every day | ORAL | Status: DC
  Administered 2024-01-18: 50 mg via ORAL
  Filled 2024-01-17: qty 1

## 2024-01-17 MED ORDER — MIRABEGRON ER 25 MG PO TB24
25.0000 mg | ORAL_TABLET | Freq: Once | ORAL | Status: AC
  Administered 2024-01-17: 25 mg via ORAL
  Filled 2024-01-17: qty 1

## 2024-01-17 MED ORDER — ROSUVASTATIN CALCIUM 20 MG PO TABS
20.0000 mg | ORAL_TABLET | Freq: Every day | ORAL | 0 refills | Status: AC
  Filled 2024-01-17: qty 30, 30d supply, fill #0

## 2024-01-17 MED ORDER — METFORMIN HCL 500 MG PO TABS
500.0000 mg | ORAL_TABLET | Freq: Two times a day (BID) | ORAL | Status: DC
  Administered 2024-01-17 – 2024-01-18 (×2): 500 mg via ORAL
  Filled 2024-01-17 (×2): qty 1

## 2024-01-17 MED ORDER — DICLOFENAC SODIUM 1 % EX GEL
2.0000 g | Freq: Four times a day (QID) | CUTANEOUS | 0 refills | Status: AC
  Filled 2024-01-17: qty 100, 30d supply, fill #0

## 2024-01-17 MED ORDER — GLIPIZIDE ER 10 MG PO TB24
10.0000 mg | ORAL_TABLET | Freq: Every day | ORAL | 0 refills | Status: AC
  Filled 2024-01-17: qty 30, 30d supply, fill #0

## 2024-01-17 NOTE — Progress Notes (Signed)
 Occupational Therapy Session Note  Patient Details  Name: Haley Jones MRN: [id number] Date of Birth: [birth year]  Today's Date: 01/17/2024 OT Individual Time: 1000-1100 OT Individual Time Calculation (min): 60 min    Short Term Goals: Week 3:  OT Short Term Goal 1 (Week 3): STG= LTG d/t ELOS   Skilled Therapeutic Interventions/Progress Updates:    1:1 Self care retraining at shower level with focus on family education with sister [name]. Focus on hands on practice with functional ambulation with safe use of the RW, sit to stands, dressing with cues for apraxia and functional problem solving, activities to encourage right UE use at non dominant level, safety in the home, hands on techniques for assisting pt if she were to LOB using gait belt, transferring in and out of shower stall (stepping backwards in and forwards out), questioning cues and extra time for functional problem solving, cues for visual attention to the right and why that is occurring after the CVA etc. Pt showered and dressed at the sink. Pt also performed functional ambulation in the hallway with multiple turns with sister providing hands on A and verbal cues.   Therapy Documentation Precautions:  Precautions Precautions: Fall (R inattention) Recall of Precautions/Restrictions: Impaired Restrictions Weight Bearing Restrictions Per Provider Order: No  Pain: Pain Assessment Pain Scale: 0-10 Pain Score: 0-No pain   Therapy/Group: Individual Therapy  Henrene Locust [facility] 01/17/2024, 11:52 AM

## 2024-01-17 NOTE — Telephone Encounter (Signed)
 Pharmacy Patient Advocate Encounter  Received notification from [employer] that Prior Authorization for Lidocaine  5% patches  has been APPROVED from 01/17/2024 to 08/13/2024   PA #/Case ID/Reference #: [id number]

## 2024-01-17 NOTE — Plan of Care (Signed)
  Problem: RH Problem Solving Goal: LTG Patient will demonstrate problem solving for (SLP) Description: LTG:  Patient will demonstrate problem solving for basic/complex daily situations with cues  (SLP) Outcome: Completed/Met   Problem: RH Memory Goal: LTG Patient will use memory compensatory aids to (SLP) Description: LTG:  Patient will use memory compensatory aids to recall biographical/new, daily complex information with cues (SLP) Outcome: Completed/Met   Problem: RH Attention Goal: LTG Patient will demonstrate this level of attention during functional activites (SLP) Description: LTG:  Patient will will demonstrate this level of attention during functional activites (SLP) Outcome: Completed/Met   Problem: RH Awareness Goal: LTG: Patient will demonstrate awareness during functional activites type of (SLP) Description: LTG: Patient will demonstrate awareness during functional activites type of (SLP) Outcome: Completed/Met   Problem: RH Expression Communication Goal: LTG Patient will verbally express basic/complex needs(SLP) Description: LTG:  Patient will verbally express basic/complex needs, wants or ideas with cues  (SLP) Outcome: Completed/Met

## 2024-01-17 NOTE — Plan of Care (Signed)
  Problem: Consults Goal: RH STROKE PATIENT EDUCATION Description: See Patient Education module for education specifics  Outcome: Progressing   Problem: RH BOWEL ELIMINATION Goal: RH STG MANAGE BOWEL WITH ASSISTANCE Description: STG Manage Bowel with Assistance. Outcome: Progressing   Problem: RH BLADDER ELIMINATION Goal: RH STG MANAGE BLADDER WITH ASSISTANCE Description: STG Manage Bladder With supervision Assistance Outcome: Progressing   Problem: RH SKIN INTEGRITY Goal: RH STG SKIN FREE OF INFECTION/BREAKDOWN Description: Manage skin free of infection/breakdown with supervision Outcome: Progressing   Problem: RH SAFETY Goal: RH STG ADHERE TO SAFETY PRECAUTIONS W/ASSISTANCE/DEVICE Description: STG Adhere to Safety Precautions With supervision Assistance/Device. Outcome: Progressing   Problem: Consults Goal: RH STROKE PATIENT EDUCATION Description: See Patient Education module for education specifics  Outcome: Progressing

## 2024-01-17 NOTE — Progress Notes (Signed)
 PROGRESS NOTE   Subjective/Complaints:  No acute complaints.  No events overnight. Mild HTN yesterday, otherwise.  Mixed continence of bladder, but mostly continent overnight.  Does think Myrbetriq is helping some, but still having urgency when going to the bathroom. LBM 6/4, continent   ROS: Patient denies fever, new vision changes, dizziness, nausea, vomiting, diarrhea,  shortness of breath or chest pain, or mood change. + Urinary incontinence-ongoing, slight improvement + Dysuria--resolved  Objective:   No results found. No results for input(s): "WBC", "HGB", "HCT", "PLT" in the last 72 hours.   Recent Labs    01/15/24 0512  NA 138  K 4.5  CL 106  CO2 25  GLUCOSE 142*  BUN 33*  CREATININE 1.54*  CALCIUM  9.3      Intake/Output Summary (Last 24 hours) at 01/17/2024 1004 Last data filed at 01/17/2024 0817 Gross per 24 hour  Intake 1664 ml  Output --  Net 1664 ml         Physical Exam: Vital Signs Blood pressure (!) 146/86, pulse 68, temperature 97.6 F (36.4 C), resp. rate 17, height 4\' 9"  (1.448 m), weight 70.7 kg, SpO2 99%.  General: No acute distress.  Sitting upright in bedside table.  HEENT: Strabismus, obvious visual deficits.  No obvious field cut as prior reported.  Mood and affect are appropriate Heart: Regular rate and rhythm no rubs murmurs or extra sounds Lungs: Clear to auscultation, breathing unlabored, no rales or wheezes Abdomen: Positive bowel sounds, soft nontender to palpation, nondistended Extremities: No clubbing, cyanosis, or edema  Mental Status: AAOx4, awake, alert.  Mild left hemineglect, improved from last week  RUE: 4-/5 Deltoid, 4/5 Biceps, 4/5 Triceps,5-/5 Grip LUE: 5/5 Deltoid, 5/5 Biceps, 5/5 Triceps, 5/5 Grip RLE: HF 4-/5, KE 4+/5, ADF 4+/5, APF 4+/5 LLE: HF 5/5, KE 5/5, ADF 5/5, APF 5/5  Tone: MAS 0 throughout right upper and lower extremity   Coordination:  Slightly reduced in right upper extremity   MSK: No joint swelling noted. R shoulder ROM reduced abd and flexion AROM-- 20 degrees--unchanged   Physical exam unchanged from the above on reexamination 01/17/24    Assessment/Plan: 1. Functional deficits which require 3+ hours per day of interdisciplinary therapy in a comprehensive inpatient rehab setting. Physiatrist is providing close team supervision and 24 hour management of active medical problems listed below. Physiatrist and rehab team continue to assess barriers to discharge/monitor patient progress toward functional and medical goals  Care Tool:  Bathing    Body parts bathed by patient: Right arm, Left arm, Chest, Abdomen, Front perineal area, Buttocks, Right upper leg, Left upper leg, Right lower leg, Left lower leg, Face         Bathing assist Assist Level: Contact Guard/Touching assist     Upper Body Dressing/Undressing Upper body dressing   What is the patient wearing?: Pull over shirt    Upper body assist Assist Level: Supervision/Verbal cueing    Lower Body Dressing/Undressing Lower body dressing      What is the patient wearing?: Incontinence brief, Pants     Lower body assist Assist for lower body dressing: Contact Guard/Touching assist     Toileting Toileting  Toileting assist Assist for toileting: Contact Guard/Touching assist     Transfers Chair/bed transfer  Transfers assist  Chair/bed transfer activity did not occur: Safety/medical concerns  Chair/bed transfer assist level: Contact Guard/Touching assist     Locomotion Ambulation   Ambulation assist      Assist level: Minimal Assistance - Patient > 75% Assistive device: Walker-rolling Max distance: 175'   Walk 10 feet activity   Assist     Assist level: Minimal Assistance - Patient > 75% Assistive device: Walker-rolling   Walk 50 feet activity   Assist    Assist level: Minimal Assistance - Patient > 75% Assistive  device: Walker-rolling    Walk 150 feet activity   Assist    Assist level: Minimal Assistance - Patient > 75% Assistive device: Walker-rolling    Walk 10 feet on uneven surface  activity   Assist     Assist level: Minimal Assistance - Patient > 75% Assistive device: Walker-rolling   Wheelchair     Assist Is the patient using a wheelchair?: No             Wheelchair 50 feet with 2 turns activity    Assist            Wheelchair 150 feet activity     Assist          Blood pressure (!) 146/86, pulse 68, temperature 97.6 F (36.4 C), resp. rate 17, height 4\' 9"  (1.448 m), weight 70.7 kg, SpO2 99%.  Medical Problem List and Plan: 1. 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 Mild linguistic deficits. At CGA to Min A level ADLs. BSC + shower chair needed. OP PT/OT/SLP at discharge  2.  Antithrombotics: -DVT/anticoagulation:  Pharmaceutical: Lovenox              -antiplatelet therapy: Aspirin  81 mg daily and Plavix  75 mg daily x 3 months then Plavix  alone  3. Pain Management: Tylenol  as needed  - 5/17 occasional headache control with Tylenol , continue to monitor  5/19: Pain worse nightly, however patient wishes to continue with repositioning and as needed medications.  No additions at this time.  5-22: ?  Possible underlying carpal tunnel syndrome affecting bilateral hand soreness in the morning--may benefit from outpatient EMG  5/23: Limited pain management options due to extensive allergy list.  Add Voltaren  gel to bilateral hands and low back, lidocaine  patch to low back, and aqua thermia as needed for chronic low back pain,  localized--well-controlled on current regimen  4. Mood/Behavior/Sleep: Effexor  37.5 mg daily, Klonopin  1 mg nightly             -antipsychotic agents: N/A             -Pt reports daughter passed away around the time she was admitted, consider neuropsych consult             -She is not interested in any adjustment to antidepressant medications at this time  5. Neuropsych/cognition: This patient is capable of making decisions on her own behalf.   -  5/20: Would benefit from neuropsych consult; recent death of her daughter --completed 5-23  - 5-27: Therapies reporting patient talking about online boyfriend with some features concerning for catfishing/scam; patient's sister informed  6. Skin/Wound Care: Routine skin checks 7. Fluids/Electrolytes/Nutrition: Routine in and outs with follow-up chemistries 8.  Diabetes mellitus.  Hemoglobin A1c 8.4.  Presently on NovoLog  5 units 3 times daily, Semglee  10 units daily.  Check blood sugars AC and at bedtime.  Diabetic teaching.  Prior to admission patient on Trulicity  0.75 mg weekly, Amaryl  2 mg daily, Glucophage 500 mg in the morning and 1000 mg in the evening, Glucotrol XL 10 mg daily.  Monitor with increased activity CBG (last 3)   - 6/5: BG mildly elevated on current regimen, resume glucophage 500 mg BID  Recent Labs    01/16/24 1643 01/16/24 2148 01/17/24 0655  GLUCAP 183* 183* 157*    9.  AKI on CKD stage III.  Follow-up chemistries -5-19: BUN/creatinine elevated from 17/1 0.19-26/1.39.  P.o. intake seem adequate, continue to encourage and will give 500 cc bolus today.  Repeat labs in AM.  No obvious medication contributions but may need to just adjust venlafaxine  if worsening. 5/20: BUN/creatinine stable.  Discussed with patient, she states she has mostly been drinking sugar-free sodas.  Agreeable to switching to water and drinking 68 to 50 cc cups today .  Repeat labs 5-2 2 AM 5/22: BUN/creatinine remain stable, on chart review baseline  creatinine ranges between 1.2 and 1.6.  She is at her current baseline, continue to monitor. 5/23: Creatinine 1.27.  Within baseline, monitor 5/27: Creatinine 1.57, increased in the setting of UTI.  Will start treatment as below and DC losartan.  Repeat in 2 days. 5/28: Administer 1 L lactated Ringer 's today; repeat labs in a.m.--mild improvement, continue to encourage POs 6/3: Cr today 1.58 but within her normal; encourage fluids  10.  Permissive hypertension.  Monitor with increased mobility.  Resume home Norvasc  5 mg daily, lisinopril  10 mg daily as needed    01/17/2024    6:54 AM 01/16/2024    7:42 PM 01/16/2024    1:16 PM  Vitals with BMI  Systolic 146 131 086  Diastolic 86 76 78  Pulse 68 81 86  5/18 BP intermittently elevated, if continues consider increase Norvasc .  For now we will continue to monitor as do not want to cause hypotension/hypoperfusion 5-19: BP currently in good range, monitor 5/20: BP elevated overnight, resume norvasc  5 mg daily (no BP meds ordered?) 5/21: Increase Norvasc  to 10 mg daily.  Add as needed hydralazine  25 mg every 8 hours as needed for elevated systolic and diastolic blood pressures.  Give a few days for Norvasc  to work. 5-22: Normotensive.  Monitor. 5-27: Somewhat hypotensive this afternoon, will DC losartan and may need IV fluids tomorrow if no improvement with infection treatment 5-28: Remains hypotensive, likely secondary to infection/SIRS.  Give 1 L of lactated Ringer 's at 100 cc/h today.  Placed hold parameters for amlodipine . 5/29: BP looking much better. Normotensive.   5-30: BP a little up this morning, will monitor 1 day and if needed resume home lisinopril  -- Normotensive, continue current regimen  11.  Hyperlipidemia. Continue Crestor   12.  GERD.  continue Protonix   13.  B12 deficiency             - Outpatient B12 injection.  Try to determine when she had this last  14. Chronic constipation             -  Reports BM yesterday              -Miralax  and senokot   -LBM 5/16, patient reports she does not feel constipated and does not have bowel movement every day.  Continue current regimen for now and monitor   5-20: Last bowel movement 5-16, will schedule Senokot S 2 tabs twice daily.  Last bowel movement 5/24--sorbitol  PRN given 5/29  5/30: Unresponsive to sorbitol  yesterday; give bowel prep versus enema today  5-31: Multiple bowel movements with bowel prep; doing much better  Last bowel movement 6/4   15.  Right hand spasticity.  Baclofen  5 mg twice daily added yesterday.  - 5-22: Encouraged patient to use squeeze foam with therapies to activate and stretch hand in the morning.  Will avoid further baclofen  increases due to CKD.  Continue voltaren  gel  5-26: No benefit in tone with baclofen ; will DC due to CKD  5-30 resolved  16. Urinary incontinence/UTI -   Patient not endorsing any symptoms suspicious for UTI--later, therapies endorsed confusion, will get UA with reflex to culture.   - Will get PVRs--May start Myrbetriq if no infection and low. - 5-27: Urinalysis positive for UTI, start p.o. Bactrim  800 mg twice daily for 7 days.  Repeat culture as prior had too many bacteria. 5-28: Hypotensive and appearing dehydrated, cultures pending.  Concern for SIRS.  Will switch from oral Bactrim  to IV ceftriaxone  1 g daily.  IV fluid as above. 5/29: + e coli, sensitivities back, switch to PO bactrim  800 mg Q12H for 4 days to finish 7 day course 5-30: Patient doing well, can finish out on p.o. regimen.  Discussed wiping from front to back 5-31: PVRs low, .  Monitor for today. 6/1: remains incontinent, low PVRs, would consider starting Myrbetriq after she finishes ABX on Monday 6/3: Mixed continence, low PVRs - start myrbetriq 25 mg daily-will get UA to recheck given dysuria but low suspicion for recurrent UTI 6-4: Tolerating Myrbetriq, continue PVRs, increase to 50 mg daily 6/5.    - UA  negative   LOS: 20 days A FACE TO FACE  EVALUATION WAS PERFORMED  Bea Lime 01/17/2024, 10:04 AM

## 2024-01-17 NOTE — Progress Notes (Signed)
 PVR 122 this am. Fsbs 157.

## 2024-01-17 NOTE — Progress Notes (Signed)
 Physical Therapy Discharge Summary  Patient Details  Name: Haley Jones MRN: [id number] Date of Birth: [birth year]  Date of Discharge from PT service:January 17, 2024  Today's Date: 01/17/2024 PT Individual Time: 1302-1413 PT Individual Time Calculation (min): 71 min    Patient has met 6 of 11 long term goals due to improved activity tolerance, improved balance, improved postural control, increased strength, improved attention, improved awareness, and improved coordination.  Patient to discharge at an ambulatory level Supervision.   Patient's care partner is independent to provide the necessary physical and cognitive assistance at discharge.  Reasons goals not met: Pt continues to require supervision for all mobility due to Rt sided inattention, lack of safety awareness, and high fall risk. Pt's sister is able to provide supervision  Recommendation:  Patient will benefit from ongoing skilled PT services in outpatient setting to continue to advance safe functional mobility, address ongoing impairments in Rt sided attention and hemiplegia, transfers, balance, ambulation, and minimize fall risk.  Equipment: Pediatric RW  Reasons for discharge: treatment goals met and discharge from hospital  Patient/family agrees with progress made and goals achieved: Yes  Skilled Therapeutic Interventions: Pt received seated in [facility] and agrees to therapy. No complaint of pain. WC transport to gym for time management. Pt completes ramp navigation and car transfer with cues for sequencing and use of RW. Following rest break, pt ambulates x200' with RW and cues to attend to Rt side, as well as safe sequencing of transition back to WC. Seated rest break, pt ambulates x50' to stairs and completes x12 6" steps with bilateral handrails and cues for step sequencing and safety. Seated rest break. Pt then ambulates x175' while holding onto tidal tank to provide unstable object for pt to manipulate while ambulating for  multitasking and coordination challenge. Pt requires minA overall with frequent contact with wall and objects on Rt side. Following rest break, pt completes additional bout with similar assistance and cues. Pt left seated in WC with alarm intact and all needs within reach.   PT Discharge Precautions/Restrictions Precautions Precautions: Fall Pain Interference Pain Interference Pain Effect on Sleep: 1. Rarely or not at all Pain Interference with Therapy Activities: 1. Rarely or not at all Pain Interference with Day-to-Day Activities: 1. Rarely or not at all Vision/Perception  Vision - History Ability to See in Adequate Light: 1 Impaired Vision - Assessment Convergence: Impaired - to be further tested in functional context Perception Perception: Impaired Preception Impairment Details: Inattention/Neglect Perception-Other Comments: Rt inattention Praxis Praxis: Impaired Praxis Impairment Details: Motor planning  Cognition Overall Cognitive Status: Impaired/Different from baseline Arousal/Alertness: Awake/alert Orientation Level: Oriented X4 Year: 2025 Month: June Day of Week: Correct Attention: Selective;Sustained;Focused Focused Attention: Appears intact Sustained Attention: Appears intact Selective Attention: Impaired Selective Attention Impairment: Verbal complex;Functional complex Memory: Impaired Memory Impairment: Decreased short term memory Decreased Short Term Memory: Functional basic;Verbal basic Awareness: Impaired Awareness Impairment: Emergent impairment Problem Solving: Impaired Problem Solving Impairment: Functional basic;Verbal basic Executive Function: Decision Making;Reasoning;Organizing;Self Correcting Reasoning: Impaired Reasoning Impairment: Verbal complex;Functional basic Organizing: Impaired Organizing Impairment: Verbal complex;Functional basic Decision Making: Impaired Decision Making Impairment: Verbal complex;Functional basic Self Correcting:  Impaired Self Correcting Impairment: Verbal complex;Functional basic Safety/Judgment: Impaired Comments: R inattention and decreased awareness and insight into deficits Sensation Sensation Light Touch: Appears Intact Coordination Gross Motor Movements are Fluid and Coordinated: No Fine Motor Movements are Fluid and Coordinated: No Coordination and Movement Description: R inattention and hemiplegia Finger Nose Finger Test: Overshoots Motor  Motor Motor: Hemiplegia Motor -  Skilled Clinical Observations: R side weakness, inattention  Mobility Bed Mobility Bed Mobility: Supine to Sit;Sit to Supine Supine to Sit: Supervision/Verbal cueing Sit to Supine: Supervision/Verbal cueing Transfers Transfers: Sit to Stand;Stand Pivot Transfers;Stand to Sit Sit to Stand: Supervision/Verbal cueing Stand to Sit: Supervision/Verbal cueing Stand Pivot Transfer Details: Verbal cues for precautions/safety Transfer (Assistive device): Rolling walker Locomotion  Gait Ambulation: Yes Gait Assistance: Supervision/Verbal cueing Gait Distance (Feet): 200 Feet Assistive device: Rolling walker Gait Assistance Details: Verbal cues for sequencing;Verbal cues for technique Gait Gait: Yes Gait Pattern: Impaired Gait velocity: decreased Stairs / Additional Locomotion Stairs: Yes Stairs Assistance: Supervision/Verbal cueing Stair Management Technique: Two rails Number of Stairs: 12 Height of Stairs: 6 Ramp: Supervision/Verbal cueing Curb: Supervision/Verbal cueing Wheelchair Mobility Wheelchair Mobility: No  Trunk/Postural Assessment  Cervical Assessment Cervical Assessment: Within Functional Limits Thoracic Assessment Thoracic Assessment: Within Functional Limits Lumbar Assessment Lumbar Assessment: Within Functional Limits Postural Control Postural Control: Deficits on evaluation Righting Reactions: delayed  Balance Balance Balance Assessed: Yes Static Sitting Balance Static Sitting -  Balance Support: Feet supported Static Sitting - Level of Assistance: 6: Modified independent (Device/Increase time) Dynamic Sitting Balance Dynamic Sitting - Balance Support: Feet supported Dynamic Sitting - Level of Assistance: 6: Modified independent (Device/Increase time) Static Standing Balance Static Standing - Balance Support: During functional activity;Bilateral upper extremity supported Static Standing - Level of Assistance: 5: Stand by assistance Dynamic Standing Balance Dynamic Standing - Balance Support: During functional activity;Bilateral upper extremity supported Dynamic Standing - Level of Assistance: 5: Stand by assistance Extremity Assessment  RLE Assessment RLE Assessment: Exceptions to [facility] General Strength Comments: hip flexion 4-/5, knee extension 4+/5, ankle DF 4+/5 LLE Assessment LLE Assessment: Exceptions to [facility] General Strength Comments: strength hip flexion 4/5, knee extension 4+/5, ankle DF 4+/5   Neva Barban, PT, DPT 01/17/2024, 4:08 PM

## 2024-01-17 NOTE — Progress Notes (Signed)
 Patient ID: Haley Jones, female   DOB: October 02, 1955, 68 y.o.   MRN: [id number]  Pt requested handicap placard.  Pt provided handicap placard.   SW faxed outpatient PT/OT/SLP to [facility]   Norval Been, MSW, LCSW Office: [phone] Cell: [phone] Fax: [phone]

## 2024-01-17 NOTE — Progress Notes (Signed)
 Speech Language Pathology Discharge Summary  Patient Details  Name: Haley Jones MRN: [id number] Date of Birth: 1955-09-27  Date of Discharge from SLP service:January 17, 2024  Today's Date: 01/17/2024 SLP Individual Time: 0800-0900 SLP Individual Time Calculation (min): 60 min   Skilled Therapeutic Interventions: Pt received in her room, nursing was assisting pt back to her chair after toileting. Direct handoff completed and SLP assisted pt w/ dressing upon request. She benefited from minA cues for problem solving and safety awareness throughout. Pt then completed written organization task initially attempted ~2 weeks ago. On this date, she was able to complete the task independently and did note require an additional amount of processing time. She also completed a specific word finding task with only minA for naming. She was able to sustain attention for ~20 mins at a time during tasks w/ only minA overall. At the end of tx tasks, she was left in her chair with the call light within reach. See d/c summary below for more info re continued ST.    Patient has met 5 of 5 long term goals.  Patient to discharge at [facility] level.  Reasons goals not met: n/a   Clinical Impression/Discharge Summary:  Pt has made good progress this stay as demonstrated by improved attention, problem solving, recall, awareness, and specific word finding. Pt/family education complete and she is set to d/c home with supervision/assistance available 24/7. Despite meeting 5/5 LTGs, mild cognitive-linguistic deficits remain. She would benefit from continued ST upon d/c to target cog-linguistic skills, facilitate return to prev roles and responsibilities, and maximize pt independence.    Care Partner:  Caregiver Able to Provide Assistance: Yes  Type of Caregiver Assistance: Physical;Cognitive  Recommendation:  24 hour supervision/assistance;Outpatient SLP  Rationale for SLP Follow Up: Maximize cognitive function  and independence;Reduce caregiver burden   Equipment: n/a   Reasons for discharge: Discharged from hospital   Patient/Family Agrees with Progress Made and Goals Achieved: Yes    [name] 01/17/2024, 8:41 AM

## 2024-01-17 NOTE — Progress Notes (Signed)
 PVR revealed .

## 2024-01-17 NOTE — Telephone Encounter (Signed)
 Pharmacy Patient Advocate Encounter   Received notification from Inpatient Request that prior authorization for Lidocaine  5% patches is required/requested.   Insurance verification completed.   The patient is insured through [employer] .   Per test claim: PA required; PA submitted to above mentioned insurance via CoverMyMeds Key/confirmation #/EOC [id number] Status is pending

## 2024-01-17 NOTE — Plan of Care (Signed)
 Pt alert and oriented x4. At bedrest w/ HOB elevated. Skin warm and dry. Mild discomfort voiced. Able to take her meds whole, w/o difficulty swallowing. Fsbs monitored. Repositioned in bed.remains weak on her rt side. She is able to roll in both directions. No acute distress noted. Call light in reach. Sr x3 elevated. Bed in low position with bed alarm activated

## 2024-01-18 ENCOUNTER — Other Ambulatory Visit (HOSPITAL_COMMUNITY): Payer: Self-pay

## 2024-01-18 DIAGNOSIS — I63512 Cerebral infarction due to unspecified occlusion or stenosis of left middle cerebral artery: Secondary | ICD-10-CM | POA: Diagnosis not present

## 2024-01-18 LAB — CREATININE, SERUM
Creatinine, Ser: 1.44 mg/dL — ABNORMAL HIGH (ref 0.44–1.00)
GFR, Estimated: 40 mL/min — ABNORMAL LOW (ref >60)

## 2024-01-18 LAB — GLUCOSE, CAPILLARY: Glucose-Capillary: 157 mg/dL — ABNORMAL HIGH (ref 70–99)

## 2024-01-18 MED ORDER — MIRABEGRON ER 25 MG PO TB24
25.0000 mg | ORAL_TABLET | Freq: Every day | ORAL | 0 refills | Status: DC
  Filled 2024-01-18: qty 30, 30d supply, fill #0

## 2024-01-18 MED ORDER — MIRABEGRON ER 25 MG PO TB24
25.0000 mg | ORAL_TABLET | Freq: Every day | ORAL | 0 refills | Status: DC
Start: 2024-01-19 — End: 2024-02-11

## 2024-01-18 MED ORDER — MIRABEGRON ER 25 MG PO TB24: 25.0000 mg | ORAL_TABLET | Freq: Every day | ORAL | Status: DC

## 2024-01-18 NOTE — Progress Notes (Signed)
 Inpatient Rehabilitation Discharge Medication Review by a Pharmacist  A complete drug regimen review was completed for this patient to identify any potential clinically significant medication issues.  High Risk Drug Classes Is patient taking? Indication by Medication  Antipsychotic No   Anticoagulant No   Antibiotic No   Opioid No   Antiplatelet Yes Clopidogrel , aspirin  x 3 months (end date 03/15/24) then clopidogrel  monotherapy - CVA  Hypoglycemics/insulin  Yes Glipizide, Trulicity  , jardiance - DM  Vasoactive Medication Yes Amlodipine -HTN  Chemotherapy No   Other Yes Acetaminophen , diclofenac  gel, lidocaine  patch - pain management  Clonazepam  - mood, anxiety Estradiol - estrogen replacement Meclizine -dizziness Mirabegron ER- bladder urgency Miralax  - bowel regimen/ constipation Pantoprazole  - reflux Rosuvastatin  - HLD Venlafaxine  - mood     Type of Medication Issue Identified Description of Issue Recommendation(s)  Drug Interaction(s) (clinically significant)     Duplicate Therapy     Allergy     No Medication Administration End Date  Clopidogrel , aspirin  x 3 months (end date 03/15/24) then clopidogrel  monotherapy. Communicate to patient /family/ caregiver prior to discharge.Patient to followup with neurology.    Incorrect Dose     Additional Drug Therapy Needed     Significant med changes from prior encounter (inform family/care partners about these prior to discharge).    Other       Clinically significant medication issues were identified that warrant physician communication and completion of prescribed/recommended actions by midnight of the next day:  No  Time spent performing this drug regimen review (minutes):  20    Alisa Irish, [place] Clinical Pharmacist  01/18/2024 9:21 AM

## 2024-01-18 NOTE — Progress Notes (Signed)
 PROGRESS NOTE   Subjective/Complaints:  No acute complaints.  No events overnight. Vitals stable Did wake up this a.m. with incontinent urination, but feels continence during the day has been better.  Did not have PVRs overnight, PVR within normal limits 152 on scan this a.m.  ROS: Patient denies fever, new vision changes, dizziness, nausea, vomiting, diarrhea,  shortness of breath or chest pain, or mood change. + Urinary incontinence-ongoing, slight improvement + Dysuria--resolved  Objective:   No results found. No results for input(s): "WBC", "HGB", "HCT", "PLT" in the last 72 hours.   Recent Labs    01/18/24 0545  CREATININE 1.44*      Intake/Output Summary (Last 24 hours) at 01/18/2024 0945 Last data filed at 01/18/2024 0749 Gross per 24 hour  Intake 692 ml  Output --  Net 692 ml         Physical Exam: Vital Signs Blood pressure 114/78, pulse 80, temperature 98 F (36.7 C), resp. rate 18, height 4\' 9"  (1.448 m), weight 70.7 kg, SpO2 95%.  General: No acute distress.  Sitting upright in wheelchair.  HEENT: Strabismus, obvious visual deficits.  No obvious field cut as prior reported.  Mood and affect are appropriate Heart: Regular rate and rhythm no rubs murmurs or extra sounds Lungs: Clear to auscultation, breathing unlabored, no rales or wheezes Abdomen: Positive bowel sounds, soft nontender to palpation, nondistended Extremities: No clubbing, cyanosis, or edema  Mental Status: AAOx4, awake, alert Mild left hemineglec  RUE: 4-/5 Deltoid, 4/5 Biceps, 4/5 Triceps,5-/5 Grip LUE: 5/5 Deltoid, 5/5 Biceps, 5/5 Triceps, 5/5 Grip RLE: HF 4-/5, KE 4+/5, ADF 4+/5, APF 4+/5 LLE: HF 5/5, KE 5/5, ADF 5/5, APF 5/5  Tone: MAS 0 throughout right upper and lower extremity   Coordination: Slightly reduced in right upper extremity   MSK: No joint swelling noted. R shoulder ROM reduced abd and flexion AROM-- 20  degrees--unchanged   Physical exam unchanged from the above on reexamination 01/18/24    Assessment/Plan: 1. Functional deficits which require 3+ hours per day of interdisciplinary therapy in a comprehensive inpatient rehab setting. Physiatrist is providing close team supervision and 24 hour management of active medical problems listed below. Physiatrist and rehab team continue to assess barriers to discharge/monitor patient progress toward functional and medical goals  Care Tool:  Bathing    Body parts bathed by patient: Right arm, Left arm, Chest, Abdomen, Front perineal area, Buttocks, Right upper leg, Left upper leg, Right lower leg, Left lower leg, Face         Bathing assist Assist Level: Contact Guard/Touching assist     Upper Body Dressing/Undressing Upper body dressing   What is the patient wearing?: Pull over shirt, Bra    Upper body assist Assist Level: Supervision/Verbal cueing    Lower Body Dressing/Undressing Lower body dressing      What is the patient wearing?: Incontinence brief, Pants     Lower body assist Assist for lower body dressing: Contact Guard/Touching assist     Toileting Toileting    Toileting assist Assist for toileting: Contact Guard/Touching assist     Transfers Chair/bed transfer  Transfers assist  Chair/bed transfer activity did not occur: Safety/medical  concerns  Chair/bed transfer assist level: Supervision/Verbal cueing     Locomotion Ambulation   Ambulation assist      Assist level: Supervision/Verbal cueing Assistive device: Walker-rolling Max distance: 200'   Walk 10 feet activity   Assist     Assist level: Supervision/Verbal cueing Assistive device: Walker-rolling   Walk 50 feet activity   Assist    Assist level: Supervision/Verbal cueing Assistive device: Walker-rolling    Walk 150 feet activity   Assist    Assist level: Supervision/Verbal cueing Assistive device: Walker-rolling    Walk  10 feet on uneven surface  activity   Assist     Assist level: Supervision/Verbal cueing Assistive device: Walker-rolling   Wheelchair     Assist Is the patient using a wheelchair?: No             Wheelchair 50 feet with 2 turns activity    Assist            Wheelchair 150 feet activity     Assist          Blood pressure 114/78, pulse 80, temperature 98 F (36.7 C), resp. rate 18, height 4\' 9"  (1.448 m), weight 70.7 kg, SpO2 95%.  Medical Problem List and Plan: 1. 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 Mild linguistic deficits. At CGA to Min A level ADLs. BSC + shower chair needed. OP PT/OT/SLP at discharge The patient is medically ready for discharge to home and will need follow-up with [facility] PM[facility]. In addition, they will need to follow up with their PCP, Neurology.   2.  Antithrombotics: -DVT/anticoagulation:  Pharmaceutical: Lovenox              -antiplatelet therapy: Aspirin  81 mg daily and Plavix  75 mg daily x 3 months then Plavix  alone  3. Pain Management: Tylenol  as needed  - 5/17 occasional headache control with Tylenol , continue to monitor  5/19: Pain worse nightly, however patient wishes to continue with repositioning and as needed medications.  No additions at this time.  5-22: ?  Possible underlying carpal tunnel syndrome affecting bilateral hand soreness in the morning--may benefit from outpatient EMG  5/23: Limited pain management options due to extensive allergy list.  Add Voltaren  gel to bilateral hands and low back, lidocaine  patch to low back, and aqua thermia as needed for chronic low back pain, localized--well-controlled on  current regimen  4. Mood/Behavior/Sleep: Effexor  37.5 mg daily, Klonopin  1 mg nightly             -antipsychotic agents: N/A             -Pt reports daughter passed away around the time she was admitted, consider neuropsych consult             -She is not interested in any adjustment to antidepressant medications at this time  5. Neuropsych/cognition: This patient is capable of making decisions on her own behalf.   - 5/20: Would benefit from neuropsych consult; recent death of her daughter --completed 5-23  - 5-27: Therapies reporting  patient talking about online boyfriend with some features concerning for catfishing/scam; patient's sister informed  6. Skin/Wound Care: Routine skin checks 7. Fluids/Electrolytes/Nutrition: Routine in and outs with follow-up chemistries 8.  Diabetes mellitus.  Hemoglobin A1c 8.4.  Presently on NovoLog  5 units 3 times daily, Semglee  10 units daily.  Check blood sugars AC and at bedtime.  Diabetic teaching.  Prior to admission patient on Trulicity  0.75 mg weekly, Amaryl  2 mg daily, Glucophage 500 mg in the morning and 1000 mg in the evening, Glucotrol XL 10 mg daily.  Monitor with increased activity CBG (last 3)   - 6/5: BG mildly elevated on current regimen, resume glucophage 500 mg BID  Recent Labs    01/17/24 1623 01/17/24 2039 01/18/24 0601  GLUCAP 126* 152* 157*    9.  AKI on CKD stage III.  Follow-up chemistries -5-19: BUN/creatinine elevated from 17/1 0.19-26/1.39.  P.o. intake seem adequate, continue to encourage and will give 500 cc bolus today.  Repeat labs in AM.  No obvious medication contributions but may need to just adjust venlafaxine  if worsening. 5/20: BUN/creatinine stable.  Discussed with patient, she states she has mostly been drinking sugar-free sodas.  Agreeable to switching to water and drinking 68 to 50 cc cups today .  Repeat labs 5-2 2 AM 5/22: BUN/creatinine remain stable, on chart review baseline creatinine ranges between 1.2 and  1.6.  She is at her current baseline, continue to monitor. 5/23: Creatinine 1.27.  Within baseline, monitor 5/27: Creatinine 1.57, increased in the setting of UTI.  Will start treatment as below and DC losartan.  Repeat in 2 days. 5/28: Administer 1 L lactated Ringer 's today; repeat labs in a.m.--mild improvement, continue to encourage POs 6/3: Cr today 1.58 but within her normal; encourage fluids  10.  Permissive hypertension.  Monitor with increased mobility.  Resume home Norvasc  5 mg daily, lisinopril  10 mg daily as needed    01/18/2024    2:50 AM 01/17/2024    6:28 PM 01/17/2024    2:25 PM  Vitals with BMI  Systolic 114 121 829  Diastolic 78 76 84  Pulse 80 84 83  5/18 BP intermittently elevated, if continues consider increase Norvasc .  For now we will continue to monitor as do not want to cause hypotension/hypoperfusion 5-19: BP currently in good range, monitor 5/20: BP elevated overnight, resume norvasc  5 mg daily (no BP meds ordered?) 5/21: Increase Norvasc  to 10 mg daily.  Add as needed hydralazine  25 mg every 8 hours as needed for elevated systolic and diastolic blood pressures.  Give a few days for Norvasc  to work. 5-22: Normotensive.  Monitor. 5-27: Somewhat hypotensive this afternoon, will DC losartan and may need IV fluids tomorrow if no improvement with infection treatment 5-28: Remains hypotensive, likely secondary to infection/SIRS.  Give 1 L of lactated Ringer 's at 100 cc/h today.  Placed hold parameters for amlodipine . 5/29: BP looking much better. Normotensive.   5-30: BP a little up this morning, will monitor 1 day and if needed resume home lisinopril  -- Normotensive, continue current regimen  11.  Hyperlipidemia. Continue Crestor   12.  GERD.  continue Protonix   13.  B12 deficiency             - Outpatient B12 injection.  Try to determine when she had this last  14. Chronic constipation             -Reports BM yesterday             -Miralax   and senokot   -LBM 5/16,  patient reports she does not feel constipated and does not have bowel movement every day.  Continue current regimen for now and monitor   5-20: Last bowel movement 5-16, will schedule Senokot S 2 tabs twice daily.  Last bowel movement 5/24--sorbitol  PRN given 5/29  5/30: Unresponsive to sorbitol  yesterday; give bowel prep versus enema today  5-31: Multiple bowel movements with bowel prep; doing much better  Last bowel movement 6/4   15.  Right hand spasticity.  Baclofen  5 mg twice daily added yesterday.  - 5-22: Encouraged patient to use squeeze foam with therapies to activate and stretch hand in the morning.  Will avoid further baclofen  increases due to CKD.  Continue voltaren  gel  5-26: No benefit in tone with baclofen ; will DC due to CKD  5-30 resolved  16. Urinary incontinence/UTI -   Patient not endorsing any symptoms suspicious for UTI--later, therapies endorsed confusion, will get UA with reflex to culture.   - Will get PVRs--May start Myrbetriq if no infection and low. - 5-27: Urinalysis positive for UTI, start p.o. Bactrim  800 mg twice daily for 7 days.  Repeat culture as prior had too many bacteria. 5-28: Hypotensive and appearing dehydrated, cultures pending.  Concern for SIRS.  Will switch from oral Bactrim  to IV ceftriaxone  1 g daily.  IV fluid as above. 5/29: + e coli, sensitivities back, switch to PO bactrim  800 mg Q12H for 4 days to finish 7 day course 5-30: Patient doing well, can finish out on p.o. regimen.  Discussed wiping from front to back 5-31: PVRs low, .  Monitor for today. 6/1: remains incontinent, low PVRs, would consider starting Myrbetriq after she finishes ABX on Monday 6/3: Mixed continence, low PVRs - start myrbetriq 25 mg daily-will get UA to recheck given dysuria but low suspicion for recurrent UTI 6-4: Tolerating Myrbetriq, continue PVRs, increase to 50 mg daily 6/5.    - UA  negative - 6-6: No PVRs overnight, reduce Myrbetriq back to 25 mg daily for  discharge.  Can further dose titrate as outpatient.   LOS: 21 days A FACE TO FACE EVALUATION WAS PERFORMED  Bea Lime 01/18/2024, 9:45 AM

## 2024-01-18 NOTE — Progress Notes (Signed)
 Inpatient Rehabilitation Care Coordinator Discharge Note   Patient Details  Name: Haley Jones MRN: [id number] Date of Birth: 17-Jul-1956   Discharge location: D/c to home  Length of Stay: 20 days  Discharge activity level: Supervision  Home/community participation: Limited  Patient response JX:BJYNWG Literacy - How often do you need to have someone help you when you read instructions, pamphlets, or other written material from your doctor or pharmacy?: Never  Patient response NF:AOZHYQ Isolation - How often do you feel lonely or isolated from those around you?: Rarely  Services provided included: RD, MD, PT, OT, SLP, RN, TR, CM, Pharmacy, Neuropsych, SW  Financial Services:  Field seismologist Utilized: Private Insurance [employer]  Choices offered to/list presented to: patient  Follow-up services arranged:  Outpatient    Outpatient Servicies: [facility] for PT/OT/SLP      Patient response to transportation need: Is the patient able to respond to transportation needs?: Yes In the past 12 months, has lack of transportation kept you from medical appointments or from getting medications?: No In the past 12 months, has lack of transportation kept you from meetings, work, or from getting things needed for daily living?: No   Patient/Family verbalized understanding of follow-up arrangements:  Yes  Individual responsible for coordination of the follow-up plan: contact pt sister Haley Jones  Confirmed correct DME delivered: Haley Jones 01/18/2024    Comments (or additional information):fam edu completed  Summary of Stay    Date/Time Discharge Planning CSW  01/15/24 1008 Pt will d/c to home with support from her sister assisting, and possible support from Haley friend. fam edu on 6/2 9am-12pm. SW will confirm there are no barriers to discharge. AAC  01/08/24 1021 Pt will d/c to home with support from her sister assisting, and possible support from Haley  friend. SW will confirm there are no barriers to discharge. AAC  12/31/23 1210 Plan to d/c home alone; ramped entry, has hospital bed/air mattress from dtr.  step in shower/handicapped accessible bathroom. Sister Haley Jones able to provide supervison as needed. DBS       Haley Jones Haley Jones

## 2024-01-22 ENCOUNTER — Telehealth: Payer: Self-pay

## 2024-01-22 NOTE — Telephone Encounter (Signed)
 Transitional Care call--sister Sheryle Donning    Are you/is patient experiencing any problems since coming home? No Are there any questions regarding any aspect of care? No Are there any questions regarding medications administration/dosing? No Are meds being taken as prescribed? YesPatient should review meds with caller to confirm Have there been any falls? No Has Home Health been to the house and/or have they contacted you? Outpt at [facility] June 18 th If not, have you tried to contact them? Can we help you contact them? Are bowels and bladder emptying properly? No Are there any unexpected incontinence issues? No If applicable, is patient following bowel/bladder programs? Any fevers, problems with breathing, unexpected pain? No Are there any skin problems or new areas of breakdown? No Has the patient/family member arranged specialty MD follow up (ie cardiology/neurology/renal/surgical/etc)? Yes  Can we help arrange? Does the patient need any other services or support that we can help arrange? No Are caregivers following through as expected in assisting the patient? Yes Has the patient quit smoking, drinking alcohol, or using drugs as recommended? Yes  Appointment time 2:00, arrive time 1:00 with Emilia Harbour on 02/01/24  1126 [employer] 103

## 2024-01-23 ENCOUNTER — Ambulatory Visit: Attending: Physician Assistant

## 2024-01-23 ENCOUNTER — Ambulatory Visit: Admitting: Occupational Therapy

## 2024-01-23 ENCOUNTER — Ambulatory Visit: Admitting: Speech Pathology

## 2024-01-23 DIAGNOSIS — I69351 Hemiplegia and hemiparesis following cerebral infarction affecting right dominant side: Secondary | ICD-10-CM | POA: Insufficient documentation

## 2024-01-23 DIAGNOSIS — R269 Unspecified abnormalities of gait and mobility: Secondary | ICD-10-CM | POA: Insufficient documentation

## 2024-01-23 DIAGNOSIS — R41841 Cognitive communication deficit: Secondary | ICD-10-CM | POA: Insufficient documentation

## 2024-01-23 DIAGNOSIS — M6281 Muscle weakness (generalized): Secondary | ICD-10-CM | POA: Insufficient documentation

## 2024-01-23 DIAGNOSIS — R2681 Unsteadiness on feet: Secondary | ICD-10-CM | POA: Insufficient documentation

## 2024-01-23 DIAGNOSIS — R4701 Aphasia: Secondary | ICD-10-CM | POA: Insufficient documentation

## 2024-01-23 DIAGNOSIS — R278 Other lack of coordination: Secondary | ICD-10-CM | POA: Insufficient documentation

## 2024-01-23 NOTE — Therapy (Incomplete)
 OUTPATIENT PHYSICAL THERAPY NEURO EVALUATION   Patient Name: Haley Jones MRN: [id number] DOB:03-31-1956, 68 y.o., female Today's Date: 01/23/2024   PCP: Dr. Firman Hughes REFERRING PROVIDER: Georjean Kite, PA-C  END OF SESSION:   Past Medical History:  Diagnosis Date   Adnexal mass    Anxiety    Chronic back pain    Depression    Diabetes mellitus without complication (HCC)    Endometriosis 03/12/2018   GERD (gastroesophageal reflux disease)    Heart murmur 02/2018   undetected until she was an adult. 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   Weight loss 12/06/2017   Past Surgical History:  Procedure Laterality Date   ABDOMINAL HYSTERECTOMY  2008   ovaries also   CESAREAN SECTION  1994   COLONOSCOPY     COLONOSCOPY WITH PROPOFOL  N/A 02/12/2018   Procedure: COLONOSCOPY WITH PROPOFOL ;  Surgeon: Toledo, Alphonsus Jeans, MD;  Location: [facility];  Service: Gastroenterology;  Laterality: N/A;   ESOPHAGOGASTRODUODENOSCOPY (EGD) WITH PROPOFOL  N/A 02/12/2018   Procedure: ESOPHAGOGASTRODUODENOSCOPY (EGD) WITH PROPOFOL ;  Surgeon: Toledo, Alphonsus Jeans, MD;  Location: [facility];  Service: Gastroenterology;  Laterality: N/A;   EYE SURGERY Left 1973   muscle shortening to straighten cross eyes   LAPAROSCOPY N/A 02/20/2018   Procedure: LAPAROSCOPY DIAGNOSTIC, ( RESECTION OF RIGHT LOWER QUADRANT ABDOMINAL MASS);  Surgeon: Franki Isles, MD;  Location: [facility]S;  Service: General;  Laterality: N/A;   ROTATOR CUFF REPAIR Left 2011   Patient Active Problem List   Diagnosis Date Noted   Depression with anxiety 01/04/2024   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   Acute CVA (cerebrovascular accident) (HCC) 12/20/2023   Primary hypertension 12/20/2023   Acute kidney injury superimposed on chronic kidney disease (HCC) 12/20/2023   DM type 2 with diabetic mixed hyperlipidemia (HCC)  11/17/2022   Major depressive disorder, recurrent, 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/17/2022   B12 deficiency 02/23/2021   SBO (small bowel obstruction) (HCC) 01/29/2019   Barrett's esophagus without dysplasia 04/15/2018   Weight loss 12/06/2017   Hoarse 12/06/2017   Lumbar disc disease 07/26/2017   Diabetes mellitus type 2, controlled, without complications (HCC) 02/09/2015   Surgical menopause 11/26/2014    ONSET DATE: 12/20/2023  REFERRING DIAG: Y40.347 (ICD-10-CM) - Left middle cerebral artery stroke (HCC)   THERAPY DIAG:  No diagnosis found.  Rationale for Evaluation and Treatment: Rehabilitation  SUBJECTIVE:  SUBJECTIVE STATEMENT: *** Pt accompanied by: {accompnied:27141}  PERTINENT HISTORY: History taken from chart  (d/c summary submitted by Dr. Annita Bash Course: Admit from 12/20/2023-12/28/2023; CIR from 5/16-01/18/2024  Zona Pedro is a 68 y.o. female with medical history significant of Hypertension, B12 deficiency, type 2 diabetes, hyperlipidemia, CKD stage IIIa, who presented to the hospital with right-sided weakness.  She also reported a 2 to 3-week history of dizziness and she has been falling more often.  She reported this to her PCP and she was started on meclizine .  Her sister came to visit her a day prior to admission and she noticed that she was not moving her right hand well  MRI showed:  1. Patchy acute cortical/subcortical infarcts within the left parietal and occipital lobes individually measuring up to 3 cm (MCA vascular territory). Subtle petechial hemorrhage associated with some of these infarcts. No frank hemorrhagic conversion. 2. Additional punctate left MCA territory cortical acute infarct within the left insula.   CT angiogram showed severe stenosis or nonocclusive  thrombus at the left MCA bifurcation affecting the inferior division.   Patient is seen by neurology, left MCA stenosis does not require intervention.  Patient is started on aspirin , Plavix , and Crestor .  PAIN:  Are you having pain? {OPRCPAIN:27236}  PRECAUTIONS: {Therapy precautions:24002}  RED FLAGS: {PT Red Flags:29287}   WEIGHT BEARING RESTRICTIONS: {Yes ***/No:24003}  FALLS: Has patient fallen in last 6 months? {fallsyesno:27318}  LIVING ENVIRONMENT: Lives with: {OPRC lives with:25569::lives with their family} Lives in: {Lives in:25570} Stairs: {opstairs:27293} Has following equipment at home: {Assistive devices:23999}  PLOF: {PLOF:24004}  PATIENT GOALS: ***  OBJECTIVE:  Note: Objective measures were completed at Evaluation unless otherwise noted.  DIAGNOSTIC FINDINGS: ***  COGNITION: Overall cognitive status: {cognition:24006}   SENSATION: {sensation:27233}  COORDINATION: ***  EDEMA:  {edema:24020}  MUSCLE TONE: {LE tone:25568}  MUSCLE LENGTH: Hamstrings: Right *** deg; Left *** deg Andy Bannister test: Right *** deg; Left *** deg  DTRs:  {DTR SITE:24025}  POSTURE: {posture:25561}  LOWER EXTREMITY ROM:     {AROM/PROM:27142}  Right Eval Left Eval  Hip flexion    Hip extension    Hip abduction    Hip adduction    Hip internal rotation    Hip external rotation    Knee flexion    Knee extension    Ankle dorsiflexion    Ankle plantarflexion    Ankle inversion    Ankle eversion     (Blank rows = not tested)  LOWER EXTREMITY MMT:    MMT Right Eval Left Eval  Hip flexion    Hip extension    Hip abduction    Hip adduction    Hip internal rotation    Hip external rotation    Knee flexion    Knee extension    Ankle dorsiflexion    Ankle plantarflexion    Ankle inversion    Ankle eversion    (Blank rows = not tested)  BED MOBILITY:  {bed mobility:32615:p}  TRANSFERS: {transfers eval:32620}  RAMP:  {ramp eval:32616}  CURB:   {curb eval:32617}  STAIRS: {stairs eval:32618} GAIT: Findings: {GaitneuroPT:32644::Distance walked: ***,Comments: ***}  FUNCTIONAL TESTS:  {Functional tests:24029}  PATIENT SURVEYS:  {rehab surveys:24030}  TREATMENT DATE: ***    PATIENT EDUCATION: Education details: *** Person educated: {Person educated:25204} Education method: {Education Method:25205} Education comprehension: {Education Comprehension:25206}  HOME EXERCISE PROGRAM: ***  GOALS: Goals reviewed with patient? Yes  SHORT TERM GOALS: Target date: ***  Pt will be independent with HEP in order to improve strength and balance in order to decrease fall risk and improve function at home and work.  Baseline: Goal status: INITIAL  2.  *** Baseline:  Goal status: INITIAL  3.  *** Baseline:  Goal status: INITIAL  4.  *** Baseline:  Goal status: INITIAL  5.  *** Baseline:  Goal status: INITIAL  6.  *** Baseline:  Goal status: INITIAL  LONG TERM GOALS: Target date: ***  ***.  Patient will complete five times sit to stand test in < 15 seconds indicating an increased LE strength and improved balance. Baseline: *** Goal status: INITIAL  ***.  Patient will improve *** score to ***   to demonstrate statistically significant improvement in mobility and quality of life as it relates to their ***.  Baseline: *** Goal status: INITIAL   ***.  Patient will increase Berg Balance score by > 6 points to demonstrate decreased fall risk during functional activities. Baseline: *** Goal status: INITIAL   ***.   Patient will reduce timed up and go to <11 seconds to reduce fall risk and demonstrate improved transfer/gait ability. Baseline: *** Goal status: INITIAL  ***.   Patient will increase 10 meter walk test to >1.70m/s as to improve gait speed for better community ambulation and  to reduce fall risk. Baseline: *** Goal status: INITIAL  ***.   Patient will increase six minute walk test distance to >1000 for progression to community ambulator and improve gait ability Baseline: *** Goal status: INITIAL    ASSESSMENT:  CLINICAL IMPRESSION: Patient is a 68 y.o. female who was seen today for physical therapy evaluation and treatment for Left middle cerebral artery stroke.   OBJECTIVE IMPAIRMENTS: {opptimpairments:25111}.   ACTIVITY LIMITATIONS: {activitylimitations:27494}  PARTICIPATION LIMITATIONS: {participationrestrictions:25113}  PERSONAL FACTORS: {Personal factors:25162} are also affecting patient's functional outcome.   REHAB POTENTIAL: {rehabpotential:25112}  CLINICAL DECISION MAKING: {clinical decision making:25114}  EVALUATION COMPLEXITY: {Evaluation complexity:25115}  PLAN:  PT FREQUENCY: {rehab frequency:25116}  PT DURATION: {rehab duration:25117}  PLANNED INTERVENTIONS: {rehab planned interventions:25118::97110-Therapeutic exercises,97530- Therapeutic [phone]- Neuromuscular re-education,97535- Self HQIO,96295- Manual therapy}  PLAN FOR NEXT SESSION: ***   Murlene Army, PT 01/23/2024, 11:34 AM

## 2024-01-28 ENCOUNTER — Ambulatory Visit: Admitting: Physical Therapy

## 2024-01-28 ENCOUNTER — Ambulatory Visit: Admitting: Occupational Therapy

## 2024-01-28 ENCOUNTER — Other Ambulatory Visit: Payer: Self-pay

## 2024-01-28 ENCOUNTER — Encounter: Payer: Self-pay | Admitting: Physical Therapy

## 2024-01-28 ENCOUNTER — Encounter: Payer: Self-pay | Admitting: Speech Pathology

## 2024-01-28 ENCOUNTER — Ambulatory Visit: Admitting: Speech Pathology

## 2024-01-28 DIAGNOSIS — R4701 Aphasia: Secondary | ICD-10-CM | POA: Diagnosis present

## 2024-01-28 DIAGNOSIS — R2681 Unsteadiness on feet: Secondary | ICD-10-CM | POA: Diagnosis present

## 2024-01-28 DIAGNOSIS — R278 Other lack of coordination: Secondary | ICD-10-CM

## 2024-01-28 DIAGNOSIS — M6281 Muscle weakness (generalized): Secondary | ICD-10-CM

## 2024-01-28 DIAGNOSIS — I69351 Hemiplegia and hemiparesis following cerebral infarction affecting right dominant side: Secondary | ICD-10-CM

## 2024-01-28 DIAGNOSIS — R269 Unspecified abnormalities of gait and mobility: Secondary | ICD-10-CM

## 2024-01-28 DIAGNOSIS — R41841 Cognitive communication deficit: Secondary | ICD-10-CM | POA: Diagnosis present

## 2024-01-28 NOTE — Therapy (Unsigned)
 OUTPATIENT OCCUPATIONAL THERAPY NEURO EVALUATION  Patient Name: Haley Jones MRN: [id number] DOB:[birth year], 68 y.o., female Today's Date: 01/29/2024  PCP: Sari Cunning, MD REFERRING PROVIDER: Sterling Eisenmenger, PA-C  END OF SESSION:   OT End of Session - 01/29/24 1819     Visit Number 1    Number of Visits 24    Date for OT Re-Evaluation 04/21/24    OT Start Time 1445    OT Stop Time 1530    OT Time Calculation (min) 45 min    Activity Tolerance Patient tolerated treatment well    Behavior During Therapy [facility] for tasks assessed/performed            Past Medical History:  Diagnosis Date   Adnexal mass    Anxiety    Chronic back pain    Depression    Diabetes mellitus without complication (HCC)    Endometriosis 03/12/2018   GERD (gastroesophageal reflux disease)    Heart murmur 02/2018   undetected until she was an adult. 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   Weight loss 12/06/2017   Past Surgical History:  Procedure Laterality Date   ABDOMINAL HYSTERECTOMY  2008   ovaries also   CESAREAN SECTION  1994   COLONOSCOPY     COLONOSCOPY WITH PROPOFOL  N/A 02/12/2018   Procedure: COLONOSCOPY WITH PROPOFOL ;  Surgeon: Toledo, Alphonsus Jeans, MD;  Location: [facility];  Service: Gastroenterology;  Laterality: N/A;   ESOPHAGOGASTRODUODENOSCOPY (EGD) WITH PROPOFOL  N/A 02/12/2018   Procedure: ESOPHAGOGASTRODUODENOSCOPY (EGD) WITH PROPOFOL ;  Surgeon: Toledo, Alphonsus Jeans, MD;  Location: [facility];  Service: Gastroenterology;  Laterality: N/A;   EYE SURGERY Left 1973   muscle shortening to straighten cross eyes   LAPAROSCOPY N/A 02/20/2018   Procedure: LAPAROSCOPY DIAGNOSTIC, ( RESECTION OF RIGHT LOWER QUADRANT ABDOMINAL MASS);  Surgeon: Franki Isles, MD;  Location: [facility];  Service: General;  Laterality: N/A;   ROTATOR CUFF REPAIR Left 2011   Patient Active Problem List    Diagnosis Date Noted   Depression with anxiety 01/04/2024   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   Acute CVA (cerebrovascular accident) (HCC) 12/20/2023   Primary hypertension 12/20/2023   Acute kidney injury superimposed on chronic kidney disease (HCC) 12/20/2023   DM type 2 with diabetic mixed hyperlipidemia (HCC) 11/17/2022   Major depressive disorder, recurrent, 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  Rationale for Evaluation and Treatment: Rehabilitation  SUBJECTIVE:   SUBJECTIVE STATEMENT: Pt. Reports having numerous falls prior to her stroke.  Pt accompanied by: self and family member  PERTINENT HISTORY: Pt. Is a 68 yo female with hx of a L MCA CVA. Pt. Was admitted into ED on 12/20/2023 with an onset of RUE weakness, numbness dysmetria, and visual deficits. Upon assessment, it was concluded that Pt. Experienced multiple infarcts in the L Parietal/Occipital region. PMHx: Dizziness, Falls  PRECAUTIONS: None  WEIGHT BEARING RESTRICTIONS: No  PAIN:  Are you having pain? No  FALLS: Has patient fallen in last 6 months?  Yes. Number of falls 10  LIVING ENVIRONMENT: Lives with: lives with their family and lives alone Lives in: House/apartment Stairs: No, Ramp Has following equipment at home: Walker - 2 wheeled, Shower bench, and bed side commode  PLOF: Independent  PATIENT GOALS: To be able to feed herself and hold a fork and knife better.   OBJECTIVE:  Note: Objective measures were completed at Evaluation unless otherwise noted.  HAND DOMINANCE: Left  ADLs: Overall  ADLs:  Transfers/ambulation related to ADLs:  Eating: Independent  Grooming: independent UB Dressing: Pt. Has difficulty managing buttons LB Dressing: jean zippers are difficult to manipulate, hiking pants, and clothing negotiation are difficult. Toileting: Independent Bathing: Independent Tub Shower transfers: stepping into shower is difficult.  Equipment: Grab bars, shower bench, bedside commode  IADLs: Shopping: Does not currently do that/has not tried Light housekeeping: Does not currently do that/has not tried Meal Prep: Has not cooked in months, did not prior to CVA, sister provides meals. Community mobility: Relies on family, and friends Medication management: Assistance from sister to initiate medication management, she gives herself a shot for diabetes Financial management: Sister is assisting with monthly bill management Handwriting: Cursive form is 10% legible; Printed form is 90% legible  MOBILITY STATUS: Needs Assist: CGA and Hx of falls  POSTURE COMMENTS:  No Significant postural limitations Sitting balance: Good  ACTIVITY TOLERANCE: Activity tolerance: Good  FUNCTIONAL OUTCOME MEASURES: MAM-20: TBD  UPPER EXTREMITY ROM:    Active ROM Right eval Left Eval [facility]  Shoulder flexion 50(58)   Shoulder abduction 43(48)   Shoulder adduction    Shoulder extension    Shoulder internal rotation    Shoulder external rotation    Elbow flexion 118(131)   Elbow extension -24(-22)   Wrist flexion 20 (21)   Wrist extension 30(31)   Wrist ulnar deviation    Wrist radial deviation    Wrist pronation    Wrist supination    (Blank rows = not tested)  UPPER EXTREMITY MMT:     MMT Right eval Left eval  Shoulder flexion 2/5 WFL  Shoulder abduction 2/5 [facility]  Shoulder adduction    Shoulder extension    Shoulder internal rotation    Shoulder external rotation    Middle trapezius    Lower trapezius    Elbow flexion 4-/5 WFL  Elbow extension 4-/5 WFL  Wrist flexion  2+/5 WFL  Wrist extension 3-/5 WFL  Wrist ulnar deviation    Wrist radial deviation    Wrist pronation    Wrist supination    (Blank rows = not tested)  HAND FUNCTION: Grip strength: Right: 21 lbs; Left: 14 lbs, Lateral pinch: Right: 7 lbs, Left: 4 lbs, and 3 point pinch: Right: 4 lbs, Left: 5 lbs  COORDINATION: 9 Hole Peg test: Right: 72 sec; Left: 40 sec  SENSATION: Light touch: Impaired  Proprioception: Impaired   EDEMA: none  MUSCLE TONE: RUE: Mild  COGNITION: Overall cognitive status: Impaired  VISION: Subjective report: Pt. Has not noticed the changes in vision but her sister has. Caregiver reports that Pt. Does not bring much attention to the R side.  Baseline vision:  Visual history:   VISION ASSESSMENT: TBD   PERCEPTION: TBD  PRAXIS: Impaired: Motor planning  TREATMENT DATE: 01/28/2024   OT Eval completed , Pt./caregiver education was provided about as indicated below.   PATIENT EDUCATION: Education details: Grip/Pinch strength, POC, ROM and BUE strength, BUE motor control  Person educated: Patient and Sister Education method: Explanation, Demonstration, Tactile cues, and Verbal cues Education comprehension: verbalized understanding and returned demonstration  HOME EXERCISE PROGRAM:    GOALS: Goals reviewed with patient? Yes  SHORT TERM GOALS: Target date: 03/10/2024    Pt. Will be independent utilizing HEPs for hand strengthening exercises.  Baseline: Eval: No current HEP program Goal status: INITIAL   LONG TERM GOALS: Target date: 04/21/2024   Pt. Will improve BUE grip strength by 5# of force to be able to securely hold items. Baseline: Eval: R grip strength: 21#, L grip strength: 14# Goal status: INITIAL  2.  Pt. Will improve RUE [facility] skills by 3 sec. to be able to manipulate zippers/buttons. Baseline: 9 hole peg  test; R side: 72 secs, L side: 40 secs Goal status: INITIAL  3.  Pt. Will increase R shoulder flex/shoulder ABD ROM by 10 degrees to perform ADLs/IADLs.  Baseline: R shoulder flex: 50(58), R shoulder ABD: 43(48) Goal status: INITIAL  4.  Pt. Will improve handwriting to 50% legible in cursive form, legibility to be able to send written correspondence Baseline: 10% legible in cursive form, 90% legible in printed form. Goal status: INITIAL  5.  Pt. Will improve BUE pinch strength by 2# of force to  be able to assist with hiking pants Baseline: R Lateral Pinch: 7, R 3 pt. Pinch: 4, L Lateral Pinch: 4, L 3 pt. Pinch: 5 Goal status: INITIAL  6.  Pt. Will increase R wrist extension/flexion by 5 degrees in anticipation of reaching hold of items for ADLs. Baseline: R Wrist ext: 20(21), R Wrist flex: 30(31) Goal status: INITIAL  ASSESSMENT:  CLINICAL IMPRESSION:  Patient is a 68 y.o. female who was seen today for an Occupational Therapy evaluation for L MCA CVA. Pt. Presents with deficits in RUE ROM, BUE weakness, visual deficits, R-sided inattention, BUE motor planning, sensation, proprioception, and RUE [facility] skills that hinder her ability to perform ADL/IADL tasks such as handwriting, UB & LB dressing, manipulating small items to manipulate buttons and zippers, and grasping items such as eating utensils. Pt. Will benefit from OT services to work on RUE ROM, RUE strengthening,  bilateral grip strength,  bilateral pinch strength, bilateral FMC skills, and R side awareness and attention in order to improve functional independence of ADL/IADL tasks at home.   PERFORMANCE DEFICITS: in functional skills including ADLs, IADLs, coordination, dexterity, proprioception, sensation, tone, ROM, strength, pain, Fine motor control, Gross motor control, endurance, and UE functional use, and psychosocial skills including environmental adaptation, habits, interpersonal interactions, and routines and behaviors.    IMPAIRMENTS: are limiting patient from ADLs, IADLs, rest and sleep, work, leisure, and social participation.   CO-MORBIDITIES: may have co-morbidities  that affects occupational performance. Patient will benefit from skilled OT to address above impairments and improve overall function.  MODIFICATION OR ASSISTANCE TO COMPLETE EVALUATION: Min-Moderate modification of tasks or assist with assess necessary to complete an evaluation.  OT OCCUPATIONAL PROFILE AND HISTORY: Detailed assessment: Review of records and additional review of physical, cognitive, psychosocial history related to current functional performance.  CLINICAL DECISION MAKING: Moderate - several treatment options, min-mod task modification necessary  REHAB POTENTIAL: Good  EVALUATION COMPLEXITY: Moderate    PLAN:  OT FREQUENCY: 2x/week  OT DURATION: 12 weeks  PLANNED INTERVENTIONS: 33295  self care/ADL training, 30865 therapeutic exercise, 97530 therapeutic activity, 97112 neuromuscular re-education, 97018 paraffin, 78469 moist heat, 97010 cryotherapy, 97034 contrast bath, 97032 electrical stimulation (manual), passive range of motion, visual/perceptual remediation/compensation, energy conservation, patient/family education, and DME and/or AE instructions  RECOMMENDED OTHER SERVICES: ST & PT  CONSULTED AND AGREED WITH PLAN OF CARE: Patient and family member/caregiver  PLAN FOR NEXT SESSION: treatment  Debora Fallen, OTS  This entire session was performed under direct supervision and direction of a licensed therapist/therapist assistant . I have personally read, edited and approve of the note as written.    Doyal Genera, OTR/L 01/29/2024, 6:23 PM

## 2024-01-28 NOTE — Therapy (Signed)
 OUTPATIENT SPEECH LANGUAGE PATHOLOGY  COGNITION EVALUATION   Patient Name: Haley Jones MRN: [id number] DOB:[birth year], 68 y.o., female Today's Date: 01/28/2024  PCP: Sari Cunning, MD  REFERRING PROVIDER: Sterling Eisenmenger, PA-C    End of Session - 01/28/24 1411     Visit Number 1    Number of Visits 25    Date for SLP Re-Evaluation 04/27/24    SLP Start Time 1405    SLP Stop Time  1445    SLP Time Calculation (min) 40 min    Activity Tolerance Patient tolerated treatment well          Past Medical History:  Diagnosis Date   Adnexal mass    Anxiety    Chronic back pain    Depression    Diabetes mellitus without complication (HCC)    Endometriosis 03/12/2018   GERD (gastroesophageal reflux disease)    Heart murmur 02/2018   undetected until she was an adult. 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   Weight loss 12/06/2017   Past Surgical History:  Procedure Laterality Date   ABDOMINAL HYSTERECTOMY  2008   ovaries also   CESAREAN SECTION  1994   COLONOSCOPY     COLONOSCOPY WITH PROPOFOL  N/A 02/12/2018   Procedure: COLONOSCOPY WITH PROPOFOL ;  Surgeon: Toledo, Alphonsus Jeans, MD;  Location: [facility];  Service: Gastroenterology;  Laterality: N/A;   ESOPHAGOGASTRODUODENOSCOPY (EGD) WITH PROPOFOL  N/A 02/12/2018   Procedure: ESOPHAGOGASTRODUODENOSCOPY (EGD) WITH PROPOFOL ;  Surgeon: Toledo, Alphonsus Jeans, MD;  Location: [facility];  Service: Gastroenterology;  Laterality: N/A;   EYE SURGERY Left 1973   muscle shortening to straighten cross eyes   LAPAROSCOPY N/A 02/20/2018   Procedure: LAPAROSCOPY DIAGNOSTIC, ( RESECTION OF RIGHT LOWER QUADRANT ABDOMINAL MASS);  Surgeon: Franki Isles, MD;  Location: [facility] ORS;  Service: General;  Laterality: N/A;   ROTATOR CUFF REPAIR Left 2011   Patient Active Problem List   Diagnosis Date Noted   Depression with anxiety 01/04/2024    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   Acute CVA (cerebrovascular accident) (HCC) 12/20/2023   Primary hypertension 12/20/2023   Acute kidney injury superimposed on chronic kidney disease (HCC) 12/20/2023   DM type 2 with diabetic mixed hyperlipidemia (HCC) 11/17/2022   Major depressive disorder, recurrent, 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/17/2022   B12 deficiency 02/23/2021   SBO (small bowel obstruction) (HCC) 01/29/2019   Barrett's esophagus without dysplasia 04/15/2018   Weight loss 12/06/2017   Hoarse 12/06/2017   Lumbar disc disease 07/26/2017   Diabetes mellitus type 2, controlled, without complications (HCC) 02/09/2015   Surgical menopause 11/26/2014    ONSET DATE: 12/20/23   REFERRING DIAG: CVA  THERAPY DIAG:  Cognitive communication deficit  Rationale for Evaluation and Treatment Rehabilitation  SUBJECTIVE:   SUBJECTIVE STATEMENT: Pt states she fell this morning because she was trying to get her clothes out when she should've waited for sister who was on the way over. Pt accompanied by: sister, Haley Jones  PERTINENT HISTORY: Haley Jones is a 68 year old left-handed female who presented to [facility] on 12/20/23 with right side weakness/falls and dizziness. MRI of the brain showed patchy acute cortical/subcortical infarcts within the left parietal occipital lobes individually measuring up to 3 cm (MCA vascular territory). Subtle petechial hemorrhage associated with some of these infarcts.  No frank hemorrhagic conversion.  Additional punctate left MCA territory cortical acute infarct within the left insula.  Background parenchymal atrophy and chronic small vessel ischemic disease with chronic lacunar infarcts.  CT angio showed severe stenosis/nonocclusive thrombus of the left MCA bifurcation affecting the inferior division.  Nonocclusive embolus also visible within the left parietal branch as well. Echocardiogram ejection fraction of 60 to 65% no wall motion abnormalities grade 1 diastolic  dysfunction. Past medical history also noted for hypertension, B12 deficiency, type 2 diabetes mellitus, hyperlipidemia, CKD stage III, depression.  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 D/c home after CIR admission 12/28/23-01/18/24. Sister lives nearby and checks on patient throughout the day, assists with meals, bathing, and medications.  DIAGNOSTIC FINDINGS: see above  PAIN:  Are you having pain? No   FALLS: Has patient fallen in last 6 months?  Yes, Number of falls: 1 since returning home from rehab, multiple prior to hospitalization  LIVING ENVIRONMENT: Lives with: lives alone Lives in: House/apartment  PLOF:  Level of assistance: Independent with IADLs Employment: Retired   PATIENT GOALS   Pt states she wants to drive again  OBJECTIVE:   COGNITIVE COMMUNICATION Overall cognitive status: Impaired Areas of impairment:  Attention: Impaired: Sustained Memory: Impaired: Short term Awareness: Impaired: Emergent and Error awareness: 20 % Executive function: Impaired: Impulse control, Problem solving, Organization, Planning, and Error awareness Impaired Functional Impairments: impulsivity contributed to fall reported at home today  AUDITORY COMPREHENSION  Overall auditory comprehension: Appears intact YES/NO questions: Appears intact Following directions: Impaired: moderately complex and due to attention vs auditory comprehension Conversation: Moderately Complex Interfering components: attention and awareness Effective technique: repetition/stressing words and visual/gestural cues  READING COMPREHENSION: not formally assessed today; will assess further PRN  EXPRESSION: verbal  VERBAL EXPRESSION:   Overall verbal expression: Appears intact Level of generative/spontaneous verbalization: conversation Automatic speech: name: intact and social response: intact  Repetition: Appears intact Naming:  Confrontation: 76-100% and Divergent: 10 animals in 60 seconds (3 perseverations), 4 m words in 60 seconds Pragmatics: Appears intact Interfering components: attention  WRITTEN EXPRESSION: Dominant hand: left Written expression: Not tested  ORAL MOTOR EXAMINATION Facial : WFL Lingual: WFL Velum: WFL Mandible: WFL Cough: WFL Voice: Hoarse, Other: diplophonia. Patient and sister report this is her baseline  MOTOR SPEECH: Overall motor speech: impaired Level of impairment: Word Respiration: thoracic breathing Phonation: hoarse and diplophonia Resonance: WFL Articulation: Appears intact Intelligibility: Intelligible Motor planning: Appears intact   STANDARDIZED ASSESSMENTS:   Cognitive Linguistic Quick Test: AGE - 18 - 69   The Cognitive Linguistic Quick Test (CLQT) was administered to assess the relative status of five cognitive domains: attention, memory, language, executive functioning, and visuospatial skills. Scores from 10 tasks were used to estimate severity ratings (standardized for age groups 18-69 years and 70-89 years) for each domain, a clock drawing task, as well as an overall composite severity rating of cognition.       Task Score Criterion Cut Scores  Personal Facts 8/8 8  Symbol Cancellation 0/12 11  Confrontation Naming 10/10 10  Clock Drawing  7/13 12  Story Retelling 7/10 6  Symbol Trails 3/10 9  Generative Naming 3/9 5  Design Memory 4/6 5  Mazes  0/8 7  Design Generation 4/13 6    Cognitive Domain Composite Score Severity Rating  Attention 35/215 Severe  Memory 141/185 Mild  Executive Function 10/40 Severe  Language 28/37 Mild  Visuospatial Skills 26/105 Severe  Clock Drawing  7/13 Severe  Composite Severity Rating  Moderate  PATIENT REPORTED OUTCOME MEASURES (PROM): To be completed in next 1-3 sessions   TODAY'S TREATMENT:  Educated on ST POC and proposed therapy goals based on today's evaluation   PATIENT  EDUCATION: Education details: severe deficits in attention, visuospatial skills, and impulsivity would make driving highly dangerous Person educated: Patient and sister Education method: Verbal cues Education comprehension: verbalized understanding and needs further education   HOME EXERCISE PROGRAM:   To be developed during subsequent therapy sessions   GOALS:  Goals reviewed with patient? Yes  SHORT TERM GOALS: Target date: 10 sessions  The patient will sustain attention in simple cognitive linguistic task for 5 minutes given min verbal cues.  Baseline: Goal status: INITIAL  2.  Patient will reduce impulsivity by preplanning steps before completing a task with moderate verbal cues. Baseline:  Goal status: INITIAL  3.  Patient will complete Patient Reported Outcome Measure to assess self-perception of deficits.  Baseline:  Goal status: INITIAL   LONG TERM GOALS: Target date: 04/27/2024  Patient will alternate attention between two moderately complex tasks 80% acc, with min verbal cues.  Baseline:  Goal status: INITIAL  2.  Patient will demonstrate emergent awareness by identifying 75% of errors with min cues for double checking. Baseline:  Goal status: INITIAL  3.  Patient will recall safety precautions/mobility recommendations using visual aids if necessary to reduce risk for falls. Baseline:  Goal status: INITIAL   ASSESSMENT:  CLINICAL IMPRESSION: Haley Jones presents with overall moderate cognitive communication impairment per standardized testing using the Cognitive Linguistic Quick Test. Primary deficit areas (severe) include attention, executive function, and visuospatial skills. Patient's impulsivity contributed significantly to testing errors, as did poor sustained attention to testing instructions. Impulsivity impacting safety at home, contributing to fall this morning at home. Patient and sister report reduced frustration tolerance. She is often frustrated  when being given instructions. Patient demonstrated some emergent awareness of errors on tasks such as mazes, but was unaware of perseverations in design generation and generative naming, and errors in clock drawing, symbol cancellation, and symbol trails tasks. Recommend skilled ST to target cognitive communication impairments to increase safety and independence.   OBJECTIVE IMPAIRMENTS include attention, memory, awareness, and executive functioning. These impairments are limiting patient from managing medications, managing appointments, managing finances, household responsibilities, ADLs/IADLs, and effectively communicating at home and in community. Factors affecting potential to achieve goals and functional outcome are ability to learn/carryover information and severity of impairments. Patient will benefit from skilled SLP services to address above impairments and improve overall function.  REHAB POTENTIAL: Good  PLAN: SLP FREQUENCY: 1-2x/week  SLP DURATION: 12 weeks  PLANNED INTERVENTIONS: Environmental controls, Cueing hierachy, Cognitive reorganization, Internal/external aids, Functional tasks, SLP instruction and feedback, Compensatory strategies, Patient/family education, and [address] Treatment of speech (30 or 45 min)    Scheryl Cushing, MS, Corporate investment banker Pathologist [phone]  [facility] Outpatient Rehabilitation at [facility] [address] [place], [place], [address] Phone: [phone]   Fax:  [phone] [facility] Outpatient Rehabilitation at [facility] [address] [place], [place], [address] Phone: [phone]   Fax:  [phone]  Patient Details  Name: Honor Frison MRN: [id number] Date of Birth: 1956/06/28 Referring Provider:  Sterling Eisenmenger, PA-C  Encounter Date: 01/28/2024   Luster Salters, CCC-SLP 01/28/2024, 2:12 PM  [facility] Outpatient Rehabilitation at [facility] [address] [place], [place], [address] Phone: [phone]   Fax:  [phone]

## 2024-01-28 NOTE — Therapy (Signed)
 OUTPATIENT PHYSICAL THERAPY NEURO EVALUATION   Patient Name: Haley Jones MRN: [id number] DOB:1956/05/26, 68 y.o., female Today's Date: 01/28/2024   PCP: Dr. Firman Jones REFERRING PROVIDER: Georjean Kite, PA-C  END OF SESSION:   PT End of Session - 01/28/24 1453     Visit Number 1    Number of Visits 24    Date for PT Re-Evaluation 04/21/24    PT Start Time 1450    PT Stop Time 1535    PT Time Calculation (min) 45 min    Equipment Utilized During Treatment Gait belt    Activity Tolerance Patient tolerated treatment well    Behavior During Therapy WFL for tasks assessed/performed          Past Medical History:  Diagnosis Date   Adnexal mass    Anxiety    Chronic back pain    Depression    Diabetes mellitus without complication (HCC)    Endometriosis 03/12/2018   GERD (gastroesophageal reflux disease)    Heart murmur 02/2018   undetected until she was an adult. 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   Weight loss 12/06/2017   Past Surgical History:  Procedure Laterality Date   ABDOMINAL HYSTERECTOMY  2008   ovaries also   CESAREAN SECTION  1994   COLONOSCOPY     COLONOSCOPY WITH PROPOFOL  N/A 02/12/2018   Procedure: COLONOSCOPY WITH PROPOFOL ;  Surgeon: Haley Jones;  Location: [facility];  Service: Gastroenterology;  Laterality: N/A;   ESOPHAGOGASTRODUODENOSCOPY (EGD) WITH PROPOFOL  N/A 02/12/2018   Procedure: ESOPHAGOGASTRODUODENOSCOPY (EGD) WITH PROPOFOL ;  Surgeon: Haley Jones;  Location: [facility];  Service: Gastroenterology;  Laterality: N/A;   EYE SURGERY Left 1973   muscle shortening to straighten cross eyes   LAPAROSCOPY N/A 02/20/2018   Procedure: LAPAROSCOPY DIAGNOSTIC, ( RESECTION OF RIGHT LOWER QUADRANT ABDOMINAL MASS);  Surgeon: Haley Isles, Jones;  Location: [facility];  Service: General;  Laterality: N/A;   ROTATOR CUFF REPAIR Left  2011   Patient Active Problem List   Diagnosis Date Noted   Depression with anxiety 01/04/2024   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   Acute CVA (cerebrovascular accident) (HCC) 12/20/2023   Primary hypertension 12/20/2023   Acute kidney injury superimposed on chronic kidney disease (HCC) 12/20/2023   DM type 2 with diabetic mixed hyperlipidemia (HCC) 11/17/2022   Major depressive disorder, recurrent, 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/17/2022   B12 deficiency 02/23/2021   SBO (small bowel obstruction) (HCC) 01/29/2019   Barrett's esophagus without dysplasia 04/15/2018   Weight loss 12/06/2017   Hoarse 12/06/2017   Lumbar disc disease 07/26/2017   Diabetes mellitus type 2, controlled, without complications (HCC) 02/09/2015   Surgical menopause 11/26/2014    ONSET DATE: 12/20/2023  REFERRING DIAG: H08.657 (ICD-10-CM) - Left middle cerebral artery stroke (HCC)   THERAPY DIAG:  Hemiplegia and hemiparesis following cerebral infarction affecting right dominant side (HCC)  Abnormality of gait  Unsteadiness on feet  Rationale for Evaluation and Treatment: Rehabilitation  SUBJECTIVE:  SUBJECTIVE STATEMENT:  Pt states she is not sure the exact date of when her CVA happened because she kept falling at home trying to care for herself and her daughter, but she finally went to the hospital when her sister insisted on it.   Pt states her special needs daughter, Haley Jones, passed away recently while pt was in the hospital at [facility] before she was transferred to [facility] for stroke rehabilitation.  Pt's sister states pt requires supervision/cuing for ADLs  to don clothes correctly, with correct orientation (potential apraxia?) Pt/sister report pt has R side inattention.  Pt reports supposedly one of her  legs is shorter than the other, believes her R LE might be the shorter one.  Follow-up with neurologist tomorrow, June 17th. 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     Pt accompanied by: self and family member (sister, Haley Jones)  PERTINENT HISTORY: History taken from chart  (d/c summary submitted by Dr. Annita Jones Course: Admit from 12/20/2023-12/28/2023; CIR from 5/16-01/18/2024  Haley Jones is a 68 y.o. female with medical history significant of Hypertension, B12 deficiency, type 2 diabetes, hyperlipidemia, CKD stage IIIa, who presented to the hospital with right-sided weakness.  She also reported a 2 to 3-week history of dizziness and she has been falling more often.  She reported this to her PCP and she was started on meclizine .  Her sister came to visit her a day prior to admission and she noticed that she was not moving her right hand well  MRI showed:  1. Patchy acute cortical/subcortical infarcts within the left parietal and occipital lobes individually measuring up to 3 cm (MCA vascular territory). Subtle petechial hemorrhage associated with some of these infarcts. No frank hemorrhagic conversion. 2. Additional punctate left MCA territory cortical acute infarct within the left insula.   CT angiogram showed severe stenosis or nonocclusive thrombus at the left MCA bifurcation affecting the inferior division.   Patient is seen by neurology, left MCA stenosis does not require intervention.  Patient is started on aspirin , Plavix , and Crestor .  PAIN:  Are you having pain? No, states I normally have some kind of issue with my back, but today it is normal.   PRECAUTIONS: Fall  RED FLAGS: None   WEIGHT BEARING RESTRICTIONS: No  FALLS: Has patient fallen in last 6 months? Yes. Number of falls 6+ before going to hospital and 1 fall since being  home from CIR - pt states she was trying to gather her clothes by herself rather than waiting for her sister to get there to help her  LIVING ENVIRONMENT Lives with: lives alone and but pt's sister, Haley Jones, comes over daily to assist with ADLs and stays to provide supervision all day - pt states at night she would be able to walk to bathroom using RW if needed (but she wears depends in event of incontinence) Lives in: House/apartment Stairs: she has steps, but she doesn't have to use them (house was wheelchair accessible for her daughter)  Has following equipment at home: Environmental consultant - 2 wheeled, shower chair, Grab bars, Ramped entry, and transport chair  PLOF: Independent, Independent with household mobility without device, Independent with homemaking with ambulation, Independent with gait, Independent with transfers, and she was the primary caregiver for her recently deceased daughter   CLOF: Sister provides supervision daily for pt to ambulate using RW safely and provides assist  for ADLs (showering and dressing - progressing towards supervision with these activities); Sister states pt requires cues to turn and step back fully prior to sitting to ensure her safety  PATIENT GOALS: get back to being able to go shopping with improved community level ambulation without the walker  OBJECTIVE:  Note: Objective measures were completed at Evaluation unless otherwise noted.  DIAGNOSTIC FINDINGS:   EXAM: MRI HEAD WITHOUT CONTRAST   TECHNIQUE: Multiplanar, multiecho pulse sequences of the brain and surrounding structures were obtained without intravenous contrast.   COMPARISON:  Brain MRI 07/31/2022.  IMPRESSION: 1. Patchy acute cortical/subcortical infarcts within the left parietal and occipital lobes individually measuring up to 3 cm (MCA vascular territory). Subtle petechial hemorrhage associated with some of these infarcts. No frank hemorrhagic conversion. 2. Additional punctate left MCA  territory cortical acute infarct within the left insula. 3. Background parenchymal atrophy and chronic small vessel ischemic disease with chronic lacunar infarcts, as described. 4. Paranasal sinus disease as outlined (including severe right maxillary sinusitis).   Electronically Signed   By: Bascom Lily D.O.   On: 12/20/2023 11:52  COGNITION: Overall cognitive status: Within functional limits for tasks assessed   SENSATION: Light touch: initially misses the first 1-2 touches on R LE, but then able to feel remaining touches Proprioception: Impaired grossly   COORDINATION: Symmetrical heel-to-shin bilaterally  EDEMA:  Not formally assessed, but none observed  MUSCLE TONE: Not formally assessed  MUSCLE LENGTH: Not formally assessed  DTRs:  Not formally assessed  POSTURE: rounded shoulders, forward head, and posterior pelvic tilt  LOWER EXTREMITY ROM:      Active  WFL bilaterally  Right Eval Left Eval  Hip flexion    Hip extension    Hip abduction    Hip adduction    Hip internal rotation    Hip external rotation    Knee flexion    Knee extension    Ankle dorsiflexion    Ankle plantarflexion    Ankle inversion    Ankle eversion     (Blank rows = not tested)  LOWER EXTREMITY MMT:    MMT Right Eval Left Eval  Hip flexion 3+, no pain 3+ with some L hip and back pain with this  Hip extension    Hip abduction    Hip adduction    Hip internal rotation    Hip external rotation    Knee flexion 3+ 4  Knee extension 4- 4  Ankle dorsiflexion 3+ 4  Ankle plantarflexion 4- 4  Ankle inversion    Ankle eversion    (Blank rows = not tested)  Manual Muscle Test Scale 0/5 = No muscle contraction can be seen or felt 1/5 = Contraction can be felt, but there is no motion 2-/5 = Part moves through incomplete ROM w/ gravity decreased 2/5 = Part moves through complete ROM w/ gravity decreased 2+/5 = Part moves through incomplete ROM (<50%) against gravity or  through complete ROM w/ gravity 3-/5 = Part moves through incomplete ROM (>50%) against gravity 3/5 = Part moves through complete ROM against gravity 3+/5 = Part moves through complete ROM against gravity/slight resistance 4-/5= Holds test position against slight to moderate pressure 4/5 = Part moves through complete ROM against gravity/moderate resistance 4+/5= Holds test position against moderate to strong pressure 5/5 = Part moves through complete ROM against gravity/full resistance  BED MOBILITY:  Findings: Sit to supine need to assess Supine to sit need to assess *pt reports some difficulty with bed mobility  TRANSFERS: Sit to stand: SBA  Assistive device utilized: Environmental consultant - 2 wheeled     Stand to sit: SBA  Assistive device utilized: Environmental consultant - 2 wheeled     Chair to chair: SBA  Assistive device utilized: Environmental consultant - 2 wheeled       RAMP:  Not tested, would benefit from testing  CURB:  Not tested, would benefit from testing  STAIRS: Not tested, would benefit from testing GAIT: Findings: Gait Characteristics: slight R ankle instability noted, step through pattern, decreased stance time- Right, decreased stride length, and decreased ankle dorsiflexion- Right, Distance walked: ~129ft, Assistive device utilized:Walker - 2 wheeled, Level of assistance: SBA and CGA, and Comments:    FUNCTIONAL TESTS:  5 times sit to stand: 38.75 seconds, using UE support 10 meter walk test: 0.48 m/s using youth RW, requires CGA for safety 6 minute walk test: need to assess Berg Balance Scale: need to assess Functional gait assessment: need to assess, when appropriate  PATIENT SURVEYS:  ABC scale 7.5%                                                                                                                              TREATMENT DATE: 01/28/2024  Pt arrived to therapy session in transport chair with her personal youth size RW with her.   Unless otherwise stated, CGA was provided and gait belt  donned in order to ensure pt safety throughout session.    Activities-specific Balance Confidence Scale:  Score: 7.5% with pt reporting only 40% confidence ambulating in home using RW, 20% getting in/out of car, 15% reaching for item on eye level shelf, and 10% picking item up from floor vs overhead on her tiptoes, and then pt rating all other items from 0-10% Increased risk of falls in community-dwelling, older adults <80% (79.89%)  0% = no confidence - 100% = complete confidence (ANPTA Core Set of Outcome Measures for Adults with Neurologic Conditions, 2018)    Five times Sit to Stand Test (FTSS) "Stand up and sit down as quickly as possible 5 times, keeping your arms folded across your chest."    TIME: 38.75 seconds, using UE support - pt significantly lacks anterior trunk lean, repeated posterior LOB Initially attempted without UE support, but pt unable to perform successfully  Times > 13.6 seconds is associated with increased disability and morbidity (Guralnik, 2000) Times > 15 seconds is predictive of recurrent falls in healthy individuals aged 60 and older (Buatois, et al., 2008) Normal performance values in community dwelling individuals aged 36 and older (Bohannon, 2006): 60-69 years: 11.4 seconds 70-79 years: 12.6 seconds 80-89 years: 14.8 seconds  MCID: >= 2.3 seconds for Vestibular Disorders (Meretta, 2006)    10 Meter Walk Test: Patient instructed to walk 10 meters (32.8 ft) as quickly and as safely as possible at their normal speed x2 and at a fast speed x2. Time measured from 2 meter mark to 8 meter mark to accommodate ramp-up  and ramp-down.  Normal speed 1: 0.50 m/s (19.81) Normal speed 2: 0.46 m/s (21.65) Average Normal speed: 0.48 m/s using youth RW, requires CGA for safety Cut off scores: <0.4 m/s = household Ambulator, 0.4-0.8 m/s = limited community Ambulator, >0.8 m/s = community Ambulator, >1.2 m/s = crossing a street, <1.0 = increased fall risk MCID 0.05 m/s  (small), 0.13 m/s (moderate), 0.06 m/s (significant)  (ANPTA Core Set of Outcome Measures for Adults with Neurologic Conditions, 2018)  Pt reports she was very leary of walking without her walker on IPR.    PATIENT EDUCATION: Education details: PT POC, findings on assessment today, recommendation to continue using RW for all functional mobility Person educated: Patient and Caregiver Haley Jones Education method: Explanation Education comprehension: verbalized understanding and needs further education  HOME EXERCISE PROGRAM: Need to initiate   GOALS: Goals reviewed with patient? Yes  SHORT TERM GOALS: Target date: 03/10/2024  Pt will be independent with HEP in order to improve strength and balance in order to decrease fall risk and improve function at home and work.  Baseline: need to initiate Goal status: INITIAL   LONG TERM GOALS: Target date: 04/21/2024  1.  Patient will complete five times sit to stand test in < 15 seconds indicating an increased LE strength and improved balance. Baseline:  38.75 seconds, using UE support  Goal status: INITIAL  2.  Patient will increase ABC scale score >80% to demonstrate better functional mobility and better confidence with ADLs.   Baseline: 7.5% Goal status: INITIAL   3.  Patient will increase Berg Balance score by > 6 points to demonstrate decreased fall risk during functional activities. Baseline: need to assess Goal status: INITIAL   4. Patient will increase 10 meter walk test to >1.3m/s as to improve gait speed for better community ambulation and to reduce fall risk. Baseline: 0.48 m/s using youth RW, requires CGA for safety  Goal status: INITIAL  5. Patient will increase six minute walk test distance to >1000 for progression to community ambulator and improve gait ability Baseline: need to assess Goal status: INITIAL    ASSESSMENT:  CLINICAL IMPRESSION: Patient is a 68 y.o. female who was seen today for physical therapy evaluation  and treatment for R hemiparesis, R inattention, potential apraxia, and decreased functional mobility following Left middle cerebral artery stroke. Patient demonstrates significant R LE strength impairments as noted on MMTs as well as functionally on 5xSTS test with pt relying on B UE support to come to standing. Patient also demonstrates impaired balance with reliance on B UE support on AD in standing with plan to further assess balance via Berg in upcoming visits. Patient demonstrates decreased gait speed of 0.40m/s using youth RW placing pt at increased risk for falls and in the limited community ambulator category. Patient demonstrates significantly decreased confidence in her balance as noted on ABC scale subjective questionnaire, despite pt rating it as if she is using her RW, which places pt at high risk for falls. Ms. Thornley will benefit from further skilled PT to improve these deficits in order to increase QOL, decrease fall risk, and ease/safety with ADLs.   OBJECTIVE IMPAIRMENTS: Abnormal gait, decreased activity tolerance, decreased balance, decreased endurance, decreased knowledge of use of DME, decreased mobility, difficulty walking, decreased strength, decreased safety awareness, impaired vision/preception, and pain.   ACTIVITY LIMITATIONS: carrying, lifting, bending, standing, squatting, stairs, transfers, bed mobility, bathing, toileting, dressing, reach over head, hygiene/grooming, and locomotion level  PARTICIPATION LIMITATIONS: meal prep, cleaning, laundry, driving, shopping,  and community activity  PERSONAL FACTORS: Age, Time since onset of injury/illness/exacerbation, and 3+ comorbidities: Hypertension, B12 deficiency, type 2 diabetes, hyperlipidemia, CKD stage IIIa are also affecting patient's functional outcome.   REHAB POTENTIAL: Good  CLINICAL DECISION MAKING: Evolving/moderate complexity  EVALUATION COMPLEXITY: Moderate  PLAN:  PT FREQUENCY: 1-2x/week  PT DURATION: 12  weeks  PLANNED INTERVENTIONS: 97164- PT Re-evaluation, 97750- Physical Performance Testing, 97110-Therapeutic exercises, 97530- Therapeutic activity, [id number]- Neuromuscular re-education, 97535- Self Care, 16109- Manual therapy, [id number]- Gait training, [phone]- Orthotic Initial, [phone]- Orthotic/Prosthetic subsequent, [phone]- Canalith repositioning, [phone]- Electrical stimulation (manual), Patient/Family education, Balance training, Stair training, Taping, Joint mobilization, Spinal mobilization, Vestibular training, Visual/preceptual remediation/compensation, DME instructions, Cryotherapy, Moist heat, and Biofeedback  PLAN FOR NEXT SESSION:  - assess bed mobility & update - teach technique for increased independence -  6 min walk test - Berg Balance Test - FGA when appropriate and add LTG - initiate HEP - progressive R LE strengthening and NMR - gait training without AD      Suzan Manon, PT, DPT, NCS, CSRS Physical Therapist - [place]  [facility]  3:35 PM 01/28/24

## 2024-01-30 ENCOUNTER — Ambulatory Visit: Admitting: Occupational Therapy

## 2024-01-30 ENCOUNTER — Ambulatory Visit

## 2024-01-30 DIAGNOSIS — R41841 Cognitive communication deficit: Secondary | ICD-10-CM

## 2024-01-30 DIAGNOSIS — M6281 Muscle weakness (generalized): Secondary | ICD-10-CM

## 2024-01-30 DIAGNOSIS — R278 Other lack of coordination: Secondary | ICD-10-CM

## 2024-01-30 DIAGNOSIS — R269 Unspecified abnormalities of gait and mobility: Secondary | ICD-10-CM

## 2024-01-30 DIAGNOSIS — I69351 Hemiplegia and hemiparesis following cerebral infarction affecting right dominant side: Secondary | ICD-10-CM

## 2024-01-30 DIAGNOSIS — R2681 Unsteadiness on feet: Secondary | ICD-10-CM

## 2024-01-30 NOTE — Therapy (Signed)
 OUTPATIENT SPEECH LANGUAGE PATHOLOGY  COGNITIVE-COMMUNICATION TREATMENT   Patient Name: Haley Jones MRN: [id number] DOB:May 23, 1956, 68 y.o., female Today's Date: 01/30/2024  PCP: Sari Cunning, MD  REFERRING PROVIDER: Sterling Eisenmenger, PA-C    End of Session - 01/30/24 1417     Visit Number 2    Number of Visits 25    Date for SLP Re-Evaluation 04/27/24    SLP Start Time 1445    SLP Stop Time  1530    SLP Time Calculation (min) 45 min    Activity Tolerance Patient tolerated treatment well          Past Medical History:  Diagnosis Date   Adnexal mass    Anxiety    Chronic back pain    Depression    Diabetes mellitus without complication (HCC)    Endometriosis 03/12/2018   GERD (gastroesophageal reflux disease)    Heart murmur 02/2018   undetected until she was an adult. 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   Weight loss 12/06/2017   Past Surgical History:  Procedure Laterality Date   ABDOMINAL HYSTERECTOMY  2008   ovaries also   CESAREAN SECTION  1994   COLONOSCOPY     COLONOSCOPY WITH PROPOFOL  N/A 02/12/2018   Procedure: COLONOSCOPY WITH PROPOFOL ;  Surgeon: Toledo, Alphonsus Jeans, MD;  Location: [facility];  Service: Gastroenterology;  Laterality: N/A;   ESOPHAGOGASTRODUODENOSCOPY (EGD) WITH PROPOFOL  N/A 02/12/2018   Procedure: ESOPHAGOGASTRODUODENOSCOPY (EGD) WITH PROPOFOL ;  Surgeon: Toledo, Alphonsus Jeans, MD;  Location: [facility];  Service: Gastroenterology;  Laterality: N/A;   EYE SURGERY Left 1973   muscle shortening to straighten cross eyes   LAPAROSCOPY N/A 02/20/2018   Procedure: LAPAROSCOPY DIAGNOSTIC, ( RESECTION OF RIGHT LOWER QUADRANT ABDOMINAL MASS);  Surgeon: Franki Isles, MD;  Location: [facility] ORS;  Service: General;  Laterality: N/A;   ROTATOR CUFF REPAIR Left 2011   Patient Active Problem List   Diagnosis Date Noted   Depression with anxiety  01/04/2024   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   Acute CVA (cerebrovascular accident) (HCC) 12/20/2023   Primary hypertension 12/20/2023   Acute kidney injury superimposed on chronic kidney disease (HCC) 12/20/2023   DM type 2 with diabetic mixed hyperlipidemia (HCC) 11/17/2022   Major depressive disorder, recurrent, 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/17/2022   B12 deficiency 02/23/2021   SBO (small bowel obstruction) (HCC) 01/29/2019   Barrett's esophagus without dysplasia 04/15/2018   Weight loss 12/06/2017   Hoarse 12/06/2017   Lumbar disc disease 07/26/2017   Diabetes mellitus type 2, controlled, without complications (HCC) 02/09/2015   Surgical menopause 11/26/2014    ONSET DATE: 12/20/23   REFERRING DIAG: CVA  THERAPY DIAG:  Cognitive communication deficit  Rationale for Evaluation and Treatment Rehabilitation  SUBJECTIVE:   SUBJECTIVE STATEMENT: Pt alert, cooperative. Talkative, ?hyperverbal. Pt accompanied by: self  PERTINENT HISTORY: Haley Jones is a 68 year old left-handed female who [facility]23 with right side weakness/falls and dizziness. MRI of the brain showed patchy acute cortical/subcortical infarcts within the left parietal occipital lobes individually measuring up to 3 cm (MCA vascular territory). Subtle petechial hemorrhage associated with some of these infarcts.  No frank hemorrhagic conversion.  Additional punctate left MCA territory cortical acute infarct within the left insula.  Background parenchymal atrophy and chronic small vessel ischemic disease with chronic lacunar infarcts.  CT angio showed severe stenosis/nonocclusive thrombus of the  left MCA bifurcation affecting the inferior division.  Nonocclusive embolus also visible within the left parietal branch as well. Echocardiogram ejection fraction of 60 to 65% no wall motion abnormalities grade 1 diastolic dysfunction. Past medical history also noted for hypertension, B12 deficiency, type 2  diabetes mellitus, hyperlipidemia, CKD stage III, depression.  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 D/c home after CIR admission 12/28/23-01/18/24. Sister lives nearby and checks on patient throughout the day, assists with meals, bathing, and medications.  DIAGNOSTIC FINDINGS: see above  PAIN:  Are you having pain? No   FALLS: Has patient fallen in last 6 months?  Yes, Number of falls: 1 since returning home from rehab, multiple prior to hospitalization  LIVING ENVIRONMENT: Lives with: lives alone Lives in: House/apartment  PLOF:  Level of assistance: Independent with IADLs Employment: Retired   PATIENT GOALS   Pt states she wants to drive again  OBJECTIVE:    TODAY'S TREATMENT:  Pt completed the following PROM with results as follows:    The Neuro-QOLT Item Bank v2.0-Cognition Function-Short Form is an eight-item test designed to measure difficulties with cognitive functioning (e.g., memory, attention and decision making or in the application of such abilities to everyday tasks (e.g., planning, organizing, calculating, remembering and learning). Source: Paula Born, J-S, et al. (2012). Neuro-QOL: brief measures of health-related quality of life for clinical research in neurology. Neurology, 78(23), [phone].   In the past 7 days...  I had to read something several times to understand it. 3- Sometimes (2-3 times)  My thinking was slow. 3- Sometimes (2-3 times)  I had to work really hard to pay attention or I would make a mistake. 3- Sometimes (2-3 times)  I had trouble concentrating. 2- Often (once a day)   How much DIFFICULTY do you currently have...  Reading and following complex instructions (e.g., directions for a new medication)? 3- Somewhat  Planning for and keeping appointments that are not part of your weekly routine (e.g., a therapy or doctor appointment, or a social gathering with  friends and family)? 2- A lot  Managing your time to do most of your daily activities? 2- A lot  Learning new tasks or instructions? 2- A lot   T-SCORE: 35; 1.5 SD below mean of 50  Pt sequenced 3-4 step ADLs with ~86% accuracy indep; improving to 100% with mod/max cues to slow down, read all options, and check for errors. Impulsivity persists.   Pt educated on changes to cognition following stroke as well as focus on meta cognition (thinking about how we think).    PATIENT EDUCATION: Education details: as above Person educated: Patient Education method: Verbal cues Education comprehension: verbalized understanding and needs further education   HOME EXERCISE PROGRAM:   To be developed during subsequent therapy sessions   GOALS:  Goals reviewed with patient? Yes  SHORT TERM GOALS: Target date: 10 sessions  The patient will sustain attention in simple cognitive linguistic task for 5 minutes given min verbal cues.  Baseline: Goal status: INITIAL  2.  Patient will reduce impulsivity by preplanning steps before completing a task with moderate verbal cues. Baseline:  Goal status: INITIAL  3.  Patient will complete Patient Reported Outcome Measure to assess self-perception of deficits.  Baseline:  Goal status: INITIAL   LONG TERM GOALS: Target date: 04/27/2024  Patient will alternate attention between two moderately complex tasks 80% acc, with min verbal cues.  Baseline:  Goal status: INITIAL  2.  Patient will demonstrate emergent awareness by identifying 75% of errors with min cues for double checking. Baseline:  Goal status: INITIAL  3.  Patient will recall safety precautions/mobility recommendations using visual aids if necessary to reduce risk for falls. Baseline:  Goal status: INITIAL   ASSESSMENT:  CLINICAL IMPRESSION: Based on recent assessment, Allisa Einspahr presents with overall moderate cognitive communication impairment per standardized testing using the  Cognitive Linguistic Quick Test. Primary deficit areas (severe) include attention, executive function, and visuospatial skills. Patient's impulsivity contributed significantly to testing errors, as did poor sustained attention to testing instructions. Impulsivity impacting safety at home, contributing to fall at home. Patient and sister report reduced frustration tolerance. She is often frustrated when being given instructions. Patient demonstrated some emergent awareness of errors on tasks such as mazes, but was unaware of perseverations in design generation and generative naming, and errors in clock drawing, symbol cancellation, and symbol trails tasks. See details of today's treatment session above. Recommend skilled ST to target cognitive communication impairments to increase safety and independence.   OBJECTIVE IMPAIRMENTS include attention, memory, awareness, and executive functioning. These impairments are limiting patient from managing medications, managing appointments, managing finances, household responsibilities, ADLs/IADLs, and effectively communicating at home and in community. Factors affecting potential to achieve goals and functional outcome are ability to learn/carryover information and severity of impairments. Patient will benefit from skilled SLP services to address above impairments and improve overall function.  REHAB POTENTIAL: Good  PLAN: SLP FREQUENCY: 1-2x/week  SLP DURATION: 12 weeks  PLANNED INTERVENTIONS: Environmental controls, Cueing hierachy, Cognitive reorganization, Internal/external aids, Functional tasks, SLP instruction and feedback, Compensatory strategies, Patient/family education, and [address] Treatment of speech (30 or 45 min)    Dia Forget, M.S., CCC-SLP Speech-Language Pathologist [facility] [phone] [place])   [facility] at [facility] [address] [place], [place], [address] Phone: [phone]   Fax:  [phone]

## 2024-01-30 NOTE — Therapy (Addendum)
 OUTPATIENT OCCUPATIONAL THERAPY NEURO EVALUATION  Patient Name: Haley Jones MRN: [id number] DOB:[birth year], 68 y.o., female Today's Date: 02/01/2024  PCP: Cleotilde Oneil FALCON, MD REFERRING PROVIDER: Pegge Toribio PARAS, PA-C   OT End of Session - 02/01/24 2242     Visit Number 2    Number of Visits 24    Date for OT Re-Evaluation 04/22/24    OT Start Time 1400    OT Stop Time 1445    OT Time Calculation (min) 45 min    Activity Tolerance Patient tolerated treatment well    Behavior During Therapy [facility] for tasks assessed/performed                Past Medical History:  Diagnosis Date   Adnexal mass    Anxiety    Chronic back pain    Depression    Diabetes mellitus without complication (HCC)    Endometriosis 03/12/2018   GERD (gastroesophageal reflux disease)    Heart murmur 02/2018   undetected until she was an adult. 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   Weight loss 12/06/2017   Past Surgical History:  Procedure Laterality Date   ABDOMINAL HYSTERECTOMY  2008   ovaries also   CESAREAN SECTION  1994   COLONOSCOPY     COLONOSCOPY WITH PROPOFOL  N/A 02/12/2018   Procedure: COLONOSCOPY WITH PROPOFOL ;  Surgeon: Toledo, Ladell POUR, MD;  Location: [facility];  Service: Gastroenterology;  Laterality: N/A;   ESOPHAGOGASTRODUODENOSCOPY (EGD) WITH PROPOFOL  N/A 02/12/2018   Procedure: ESOPHAGOGASTRODUODENOSCOPY (EGD) WITH PROPOFOL ;  Surgeon: Toledo, Ladell POUR, MD;  Location: [facility];  Service: Gastroenterology;  Laterality: N/A;   EYE SURGERY Left 1973   muscle shortening to straighten cross eyes   LAPAROSCOPY N/A 02/20/2018   Procedure: LAPAROSCOPY DIAGNOSTIC, ( RESECTION OF RIGHT LOWER QUADRANT ABDOMINAL MASS);  Surgeon: Nicholaus Selinda Birmingham, MD;  Location: [facility] ORS;  Service: General;  Laterality: N/A;   ROTATOR CUFF REPAIR Left 2011   Patient Active Problem List   Diagnosis  Date Noted   Depression with anxiety 01/04/2024   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   Acute CVA (cerebrovascular accident) (HCC) 12/20/2023   Primary hypertension 12/20/2023   Acute kidney injury superimposed on chronic kidney disease (HCC) 12/20/2023   DM type 2 with diabetic mixed hyperlipidemia (HCC) 11/17/2022   Major depressive disorder, recurrent, 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  Rationale for Evaluation and Treatment: Rehabilitation  SUBJECTIVE:   SUBJECTIVE STATEMENT: Pt. Reports 2/10 aching pain in the RUE.  Pt accompanied by: self and family member  PERTINENT HISTORY: Pt. Is a 68 yo female with hx of a L MCA CVA. Pt. Was admitted into ED on 12/20/2023 with an onset of RUE weakness, numbness dysmetria, and visual deficits. Upon assessment, it was concluded that Pt. Experienced multiple infarcts in the L Parietal/Occipital region. PMHx: Dizziness, Falls  PRECAUTIONS: None  WEIGHT BEARING RESTRICTIONS: No  PAIN:  Are you having pain? No  FALLS: Has patient fallen in last 6 months? Yes.  Number of falls 10  LIVING ENVIRONMENT: Lives with: lives with their family and lives alone Lives in: House/apartment Stairs: No, Ramp Has following equipment at home: Walker - 2 wheeled, Shower bench, and bed side commode  PLOF: Independent  PATIENT GOALS: To be able to feed herself and hold a fork and knife better.   OBJECTIVE:  Note: Objective measures were completed at Evaluation unless otherwise noted.  HAND DOMINANCE: Left  ADLs: Overall ADLs:   Transfers/ambulation related to ADLs:  Eating: Independent  Grooming: independent UB Dressing: Pt. Has difficulty managing buttons LB Dressing: jean zippers are difficult to manipulate, hiking pants, and clothing negotiation are difficult. Toileting: Independent Bathing: Independent Tub Shower transfers: stepping into shower is difficult.  Equipment: Grab bars, shower bench, bedside commode  IADLs: Shopping: Does not currently do that/has not tried Light housekeeping: Does not currently do that/has not tried Meal Prep: Has not cooked in months, did not prior to CVA, sister provides meals. Community mobility: Relies on family, and friends Medication management: Assistance from sister to initiate medication management, she gives herself a shot for diabetes Financial management: Sister is assisting with monthly bill management Handwriting: Cursive form is 10% legible; Printed form is 90% legible  MOBILITY STATUS: Needs Assist: CGA and Hx of falls  POSTURE COMMENTS:  No Significant postural limitations Sitting balance: Good  ACTIVITY TOLERANCE: Activity tolerance: Good  FUNCTIONAL OUTCOME MEASURES: MAM-20: TBD  UPPER EXTREMITY ROM:    Active ROM Right eval Left Eval [facility]  Shoulder flexion 50(58)   Shoulder abduction 43(48)   Shoulder adduction    Shoulder extension    Shoulder internal rotation    Shoulder external rotation    Elbow flexion 118(131)   Elbow extension -24(-22)   Wrist flexion 20 (21)   Wrist extension 30(31)   Wrist ulnar deviation    Wrist radial deviation    Wrist pronation    Wrist supination    (Blank rows = not tested)  UPPER EXTREMITY MMT:     MMT Right eval Left eval  Shoulder flexion 2/5 WFL  Shoulder abduction 2/5 [facility]  Shoulder adduction    Shoulder extension    Shoulder internal rotation    Shoulder external rotation    Middle trapezius    Lower trapezius    Elbow flexion 4-/5 WFL  Elbow extension 4-/5 WFL  Wrist flexion 2+/5  WFL  Wrist extension 3-/5 WFL  Wrist ulnar deviation    Wrist radial deviation    Wrist pronation    Wrist supination    (Blank rows = not tested)  HAND FUNCTION: Grip strength: Right: 21 lbs; Left: 14 lbs, Lateral pinch: Right: 7 lbs, Left: 4 lbs, and 3 point pinch: Right: 4 lbs, Left: 5 lbs  COORDINATION: 9 Hole Peg test: Right: 72 sec; Left: 40 sec  SENSATION: Light touch: Impaired  Proprioception: Impaired   EDEMA: none  MUSCLE TONE: RUE: Mild  COGNITION: Overall cognitive status: Impaired  VISION: Subjective report: Pt. Has not noticed the changes in vision but her sister has. Caregiver reports that Pt. Does not bring much attention to the R side.  Baseline vision:  Visual history:   VISION ASSESSMENT: TBD   PERCEPTION: TBD  PRAXIS: Impaired: Motor planning  TREATMENT DATE: 01/30/2024   Therapeutic Activities:  -Pt. Performed hand strengthening with yellow theraputty. Pt. required cues for proper technique. Pt. worked on gross grip loop, lateral pinch, 3pt. pinch, gross digit extension, digit extension table spread, digit abduction loop, single digit extension loop, thumb opposition, and lumbical ex,  Pt. required verbal and tactile cues for proper technique.   -Pt. worked on [facility] skills grasping jumbo pegs, holding 2 jumbo pegs at a time, performing translatory movements, moving each peg through the hand from the palm to the tip of the 2nd digit and thumb in preparation for placing them in the pegboard following a design pattern.  -To further challenge the task, Pt. Stored Jumbo pegs into container in multiple planes to promote functional reaching, shoulder and elbow AROM.   PATIENT EDUCATION: Education details: Grip/Pinch strength, POC, ROM and BUE strength, BUE motor control  Person educated: Patient and Sister Education method:  Explanation, Demonstration, Tactile cues, and Verbal cues Education comprehension: verbalized understanding and returned demonstration  HOME EXERCISE PROGRAM: Yellow theraputty HEP   GOALS: Goals reviewed with patient? Yes  SHORT TERM GOALS: Target date: 03/10/2024    Pt. Will be independent utilizing HEPs for hand strengthening exercises.  Baseline: Eval: No current HEP program Goal status: INITIAL   LONG TERM GOALS: Target date: 04/21/2024   Pt. Will improve BUE grip strength by 5# of force to be able to securely hold items. Baseline: Eval: R grip strength: 21#, L grip strength: 14# Goal status: INITIAL  2.  Pt. Will improve RUE [facility] skills by 3 sec. to be able to manipulate zippers/buttons. Baseline: 9 hole peg test; R side: 72 secs, L side: 40 secs Goal status: INITIAL  3.  Pt. Will increase R shoulder flex/shoulder ABD ROM by 10 degrees to perform ADLs/IADLs.  Baseline: R shoulder flex: 50(58), R shoulder ABD: 43(48) Goal status: INITIAL  4.  Pt. Will improve handwriting to 50% legible in cursive form, legibility to be able to send written correspondence Baseline: 10% legible in cursive form, 90% legible in printed form. Goal status: INITIAL  5.  Pt. Will improve BUE pinch strength by 2# of force to  be able to assist with hiking pants Baseline: R Lateral Pinch: 7, R 3 pt. Pinch: 4, L Lateral Pinch: 4, L 3 pt. Pinch: 5 Goal status: INITIAL  6.  Pt. Will increase R wrist extension/flexion by 5 degrees in anticipation of reaching hold of items for ADLs. Baseline: R Wrist ext: 20(21), R Wrist flex: 30(31) Goal status: INITIAL  ASSESSMENT:  CLINICAL IMPRESSION:  Pt. Presents with aching pain in the RUE. Pt. Required mod vc and visual demonstration of translatory movements, moving jumbo pegs from palm to the tips of the 2nd digit and thumb. Pt. Presented with difficulty storing jumbo pegs in hand in preparation of placing jumbo pegs in peg board. Pt. Reports having 2/10  aching pain during repetitive gripping in the RUE.  Pt. Has difficulty with sustaining grip while combining movements with wrist and elbow extension. Pt. Requires Mod vc and visual demonstration for proper form and technique to complete yellow theraputty HEP.  Pt. Will benefit from OT services to work on RUE ROM, RUE strengthening,  bilateral grip strength,  bilateral pinch strength, bilateral FMC skills, and R side awareness and attention in order to improve functional independence of ADL/IADL tasks at home.   PERFORMANCE DEFICITS: in functional skills including ADLs, IADLs, coordination, dexterity, proprioception, sensation, tone, ROM, strength, pain, Fine motor control, Gross motor control,  endurance, and UE functional use, and psychosocial skills including environmental adaptation, habits, interpersonal interactions, and routines and behaviors.   IMPAIRMENTS: are limiting patient from ADLs, IADLs, rest and sleep, work, leisure, and social participation.   CO-MORBIDITIES: may have co-morbidities  that affects occupational performance. Patient will benefit from skilled OT to address above impairments and improve overall function.  MODIFICATION OR ASSISTANCE TO COMPLETE EVALUATION: Min-Moderate modification of tasks or assist with assess necessary to complete an evaluation.  OT OCCUPATIONAL PROFILE AND HISTORY: Detailed assessment: Review of records and additional review of physical, cognitive, psychosocial history related to current functional performance.  CLINICAL DECISION MAKING: Moderate - several treatment options, min-mod task modification necessary  REHAB POTENTIAL: Good  EVALUATION COMPLEXITY: Moderate    PLAN:  OT FREQUENCY: 2x/week  OT DURATION: 12 weeks  PLANNED INTERVENTIONS: 97535 self care/ADL training, 02889 therapeutic exercise, 97530 therapeutic activity, 97112 neuromuscular re-education, 97018 paraffin, 02989 moist heat, 97010 cryotherapy, 97034 contrast bath, 97032  electrical stimulation (manual), passive range of motion, visual/perceptual remediation/compensation, energy conservation, patient/family education, and DME and/or AE instructions  RECOMMENDED OTHER SERVICES: ST & PT  CONSULTED AND AGREED WITH PLAN OF CARE: Patient and family member/caregiver  PLAN FOR NEXT SESSION: treatment  Damien Nap, OTS  This entire session was performed under direct supervision and direction of a licensed therapist/therapist assistant . I have personally read, edited and approve of the note as written.    Richardson Joesph LIMES, OTR/L 02/01/2024, 10:42 PM

## 2024-01-30 NOTE — Therapy (Incomplete)
 OUTPATIENT PHYSICAL THERAPY NEURO TREATMENT   Patient Name: Haley Jones MRN: [id number] DOB:[birth year], 68 y.o., female Today's Date: 01/31/2024   PCP: Dr. Firman Jones REFERRING PROVIDER: Georjean Kite, PA-C  END OF SESSION:   PT End of Session - 01/30/24 1329     Visit Number 2    Number of Visits 24    Date for PT Re-Evaluation 04/21/24    PT Start Time 1329    PT Stop Time 1359    PT Time Calculation (min) 30 min    Equipment Utilized During Treatment Gait belt    Activity Tolerance Patient tolerated treatment well    Behavior During Therapy WFL for tasks assessed/performed           Past Medical History:  Diagnosis Date   Adnexal mass    Anxiety    Chronic back pain    Depression    Diabetes mellitus without complication (HCC)    Endometriosis 03/12/2018   GERD (gastroesophageal reflux disease)    Heart murmur 02/2018   undetected until she was an adult. 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   Weight loss 12/06/2017   Past Surgical History:  Procedure Laterality Date   ABDOMINAL HYSTERECTOMY  2008   ovaries also   CESAREAN SECTION  1994   COLONOSCOPY     COLONOSCOPY WITH PROPOFOL  N/A 02/12/2018   Procedure: COLONOSCOPY WITH PROPOFOL ;  Surgeon: Toledo, Alphonsus Jeans, MD;  Location: [facility];  Service: Gastroenterology;  Laterality: N/A;   ESOPHAGOGASTRODUODENOSCOPY (EGD) WITH PROPOFOL  N/A 02/12/2018   Procedure: ESOPHAGOGASTRODUODENOSCOPY (EGD) WITH PROPOFOL ;  Surgeon: Toledo, Alphonsus Jeans, MD;  Location: [facility];  Service: Gastroenterology;  Laterality: N/A;   EYE SURGERY Left 1973   muscle shortening to straighten cross eyes   LAPAROSCOPY N/A 02/20/2018   Procedure: LAPAROSCOPY DIAGNOSTIC, ( RESECTION OF RIGHT LOWER QUADRANT ABDOMINAL MASS);  Surgeon: Haley Isles, MD;  Location: [facility];  Service: General;  Laterality: N/A;   ROTATOR CUFF REPAIR  Left 2011   Patient Active Problem List   Diagnosis Date Noted   Depression with anxiety 01/04/2024   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   Acute CVA (cerebrovascular accident) (HCC) 12/20/2023   Primary hypertension 12/20/2023   Acute kidney injury superimposed on chronic kidney disease (HCC) 12/20/2023   DM type 2 with diabetic mixed hyperlipidemia (HCC) 11/17/2022   Major depressive disorder, recurrent, 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/17/2022   B12 deficiency 02/23/2021   SBO (small bowel obstruction) (HCC) 01/29/2019   Barrett's esophagus without dysplasia 04/15/2018   Weight loss 12/06/2017   Hoarse 12/06/2017   Lumbar disc disease 07/26/2017   Diabetes mellitus type 2, controlled, without complications (HCC) 02/09/2015   Surgical menopause 11/26/2014    ONSET DATE: 12/20/2023  REFERRING DIAG: X91.478 (ICD-10-CM) - Left middle cerebral artery stroke (HCC)   THERAPY DIAG:  Muscle weakness (generalized)  Other lack of coordination  Hemiplegia and hemiparesis following cerebral infarction affecting right dominant side (HCC)  Abnormality of gait  Unsteadiness on feet  Cognitive communication deficit  Rationale for Evaluation and Treatment: Rehabilitation  SUBJECTIVE:  SUBJECTIVE STATEMENT:  From Today: I am doing okay. I just lost my daughter who had special needs. Reports her sister is coming by daily to assist with ADL's, meals, and meds.    From EVAL: Pt states she is not sure the exact date of when her CVA happened because she kept falling at home trying to care for herself and her daughter, but she finally went to the hospital when her sister insisted on it.   Pt states her special needs daughter, Haley Jones, passed away recently while pt was in the hospital at [facility] before she was  transferred to [facility] for stroke rehabilitation.  Pt's sister states pt requires supervision/cuing for ADLs  to don clothes correctly, with correct orientation (potential apraxia?) Pt/sister report pt has R side inattention.  Pt reports supposedly one of her legs is shorter than the other, believes her R LE might be the shorter one.  Follow-up with neurologist tomorrow, June 17th. 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     Pt accompanied by: self and family member (sister, Haley Jones)  PERTINENT HISTORY: History taken from chart  (d/c summary submitted by Dr. Annita Bash Course: Admit from 12/20/2023-12/28/2023; CIR from 5/16-01/18/2024  Haley Jones is a 68 y.o. female with medical history significant of Hypertension, B12 deficiency, type 2 diabetes, hyperlipidemia, CKD stage IIIa, who presented to the hospital with right-sided weakness.  She also reported a 2 to 3-week history of dizziness and she has been falling more often.  She reported this to her PCP and she was started on meclizine .  Her sister came to visit her a day prior to admission and she noticed that she was not moving her right hand well  MRI showed:  1. Patchy acute cortical/subcortical infarcts within the left parietal and occipital lobes individually measuring up to 3 cm (MCA vascular territory). Subtle petechial hemorrhage associated with some of these infarcts. No frank hemorrhagic conversion. 2. Additional punctate left MCA territory cortical acute infarct within the left insula.   CT angiogram showed severe stenosis or nonocclusive thrombus at the left MCA bifurcation affecting the inferior division.   Patient is seen by neurology, left MCA stenosis does not require intervention.  Patient is started on aspirin , Plavix , and Crestor .  PAIN:  Are you having pain? No,  states I normally have some kind of issue with my back, but today it is normal.   PRECAUTIONS: Fall  RED FLAGS: None   WEIGHT BEARING RESTRICTIONS: No  FALLS: Has patient fallen in last 6 months? Yes. Number of falls 6+ before going to hospital and 1 fall since being home from CIR - pt states she was trying to gather her clothes by herself rather than waiting for her sister to get there to help her  LIVING ENVIRONMENT Lives with: lives alone and but pt's sister, Haley Jones, comes over daily to assist with ADLs and stays to provide supervision all day - pt states at night she would be able to walk to bathroom using RW if needed (but she wears depends in event of incontinence) Lives in: House/apartment Stairs: she has steps, but she doesn't have to use them (house was wheelchair accessible for her daughter)  Has following equipment at home: Otho Blitz - 2 wheeled, shower chair, Grab bars, Ramped entry, and transport chair  PLOF: Independent, Independent with household mobility without device, Independent with homemaking with ambulation,  Independent with gait, Independent with transfers, and she was the primary caregiver for her recently deceased daughter   CLOF: Sister provides supervision daily for pt to ambulate using RW safely and provides assist for ADLs (showering and dressing - progressing towards supervision with these activities); Sister states pt requires cues to turn and step back fully prior to sitting to ensure her safety  PATIENT GOALS: get back to being able to go shopping with improved community level ambulation without the walker  OBJECTIVE:  Note: Objective measures were completed at Evaluation unless otherwise noted.  DIAGNOSTIC FINDINGS:   EXAM: MRI HEAD WITHOUT CONTRAST   TECHNIQUE: Multiplanar, multiecho pulse sequences of the brain and surrounding structures were obtained without intravenous contrast.   COMPARISON:  Brain MRI 07/31/2022.  IMPRESSION: 1. Patchy acute  cortical/subcortical infarcts within the left parietal and occipital lobes individually measuring up to 3 cm (MCA vascular territory). Subtle petechial hemorrhage associated with some of these infarcts. No frank hemorrhagic conversion. 2. Additional punctate left MCA territory cortical acute infarct within the left insula. 3. Background parenchymal atrophy and chronic small vessel ischemic disease with chronic lacunar infarcts, as described. 4. Paranasal sinus disease as outlined (including severe right maxillary sinusitis).   Electronically Signed   By: Bascom Lily D.O.   On: 12/20/2023 11:52  COGNITION: Overall cognitive status: Within functional limits for tasks assessed   SENSATION: Light touch: initially misses the first 1-2 touches on R LE, but then able to feel remaining touches Proprioception: Impaired grossly   COORDINATION: Symmetrical heel-to-shin bilaterally  EDEMA:  Not formally assessed, but none observed  MUSCLE TONE: Not formally assessed  MUSCLE LENGTH: Not formally assessed  DTRs:  Not formally assessed  POSTURE: rounded shoulders, forward head, and posterior pelvic tilt  LOWER EXTREMITY ROM:      Active  WFL bilaterally  Right Eval Left Eval  Hip flexion    Hip extension    Hip abduction    Hip adduction    Hip internal rotation    Hip external rotation    Knee flexion    Knee extension    Ankle dorsiflexion    Ankle plantarflexion    Ankle inversion    Ankle eversion     (Blank rows = not tested)  LOWER EXTREMITY MMT:    MMT Right Eval Left Eval  Hip flexion 3+, no pain 3+ with some L hip and back pain with this  Hip extension    Hip abduction    Hip adduction    Hip internal rotation    Hip external rotation    Knee flexion 3+ 4  Knee extension 4- 4  Ankle dorsiflexion 3+ 4  Ankle plantarflexion 4- 4  Ankle inversion    Ankle eversion    (Blank rows = not tested)  Manual Muscle Test Scale 0/5 = No muscle  contraction can be seen or felt 1/5 = Contraction can be felt, but there is no motion 2-/5 = Part moves through incomplete ROM w/ gravity decreased 2/5 = Part moves through complete ROM w/ gravity decreased 2+/5 = Part moves through incomplete ROM (<50%) against gravity or through complete ROM w/ gravity 3-/5 = Part moves through incomplete ROM (>50%) against gravity 3/5 = Part moves through complete ROM against gravity 3+/5 = Part moves through complete ROM against gravity/slight resistance 4-/5= Holds test position against slight to moderate pressure 4/5 = Part moves through complete ROM against gravity/moderate resistance 4+/5= Holds test position against moderate to strong pressure  5/5 = Part moves through complete ROM against gravity/full resistance  BED MOBILITY:  Findings: Sit to supine need to assess Supine to sit need to assess *pt reports some difficulty with bed mobility  TRANSFERS: Sit to stand: SBA  Assistive device utilized: Environmental consultant - 2 wheeled     Stand to sit: SBA  Assistive device utilized: Environmental consultant - 2 wheeled     Chair to chair: SBA  Assistive device utilized: Environmental consultant - 2 wheeled       RAMP:  Not tested, would benefit from testing  CURB:  Not tested, would benefit from testing  STAIRS: Not tested, would benefit from testing GAIT: Findings: Gait Characteristics: slight R ankle instability noted, step through pattern, decreased stance time- Right, decreased stride length, and decreased ankle dorsiflexion- Right, Distance walked: ~114ft, Assistive device utilized:Walker - 2 wheeled, Level of assistance: SBA and CGA, and Comments:    FUNCTIONAL TESTS:  5 times sit to stand: 38.75 seconds, using UE support 10 meter walk test: 0.48 m/s using youth RW, requires CGA for safety 6 minute walk test: need to assess Berg Balance Scale: need to assess Functional gait assessment: need to assess, when appropriate  PATIENT SURVEYS:  ABC scale 7.5%                                                                                                                               TREATMENT DATE: 01/31/2024  Pt arrived to therapy session late today- in transport chair with her personal youth size RW with her.   Self care: adjusted her walker higher for improved posture and decreased stress to B elbows/shoulders.  6 Min Walk Test:  Instructed patient to ambulate as quickly and as safely as possible for 6 minutes using LRAD. Patient was allowed to take standing rest breaks without stopping the test, but if the patient required a sitting rest break the clock would be stopped and the test would be over.  Results: 410 feet (125 meters, Avg speed 0.71m/s) using a front wheeled youth walker with CGA. Results indicate that the patient has reduced endurance with ambulation compared to age matched norms.  Age Matched Norms: 46-69 yo M: 75 F: 27, 27-79 yo M: 77 F: 471, 58-89 yo M: 417 F: 392 MDC: 58.21 meters (190.98 feet) or 50 meters (ANPTA Core Set of Outcome Measures for Adults with Neurologic Conditions, 2018)    [facility] PT Assessment - 01/30/24 1349       Standardized Balance Assessment   Standardized Balance Assessment Berg Balance Test      Berg Balance Test   Sit to Stand Able to stand without using hands and stabilize independently    Standing Unsupported Able to stand 2 minutes with supervision    Sitting with Back Unsupported but Feet Supported on Floor or Stool Able to sit safely and securely 2 minutes    Stand to Sit Controls descent by using hands    Transfers Able  to transfer safely, definite need of hands    Standing Unsupported with Eyes Closed Able to stand 10 seconds with supervision    Standing Unsupported with Feet Together Able to place feet together independently and stand for 1 minute with supervision    From Standing, Reach Forward with Outstretched Arm Can reach forward >5 cm safely (2)    From Standing Position, Pick up Object from Floor Able to pick  up shoe, needs supervision    From Standing Position, Turn to Look Behind Over each Shoulder Turn sideways only but maintains balance    Turn 360 Degrees Needs assistance while turning    Standing Unsupported, Alternately Place Feet on Step/Stool Able to complete >2 steps/needs minimal assist    Standing Unsupported, One Foot in Front Able to take small step independently and hold 30 seconds    Standing on One Leg Tries to lift leg/unable to hold 3 seconds but remains standing independently    Total Score 34           Unless otherwise stated, CGA was provided and gait belt donned in order to ensure pt safety throughout session.    PATIENT EDUCATION: Education details: PT POC, findings on assessment today, recommendation to continue using RW for all functional mobility Person educated: Patient and Caregiver Haley Jones Education method: Explanation Education comprehension: verbalized understanding and needs further education  HOME EXERCISE PROGRAM: Need to initiate   GOALS: Goals reviewed with patient? Yes  SHORT TERM GOALS: Target date: 03/10/2024  Pt will be independent with HEP in order to improve strength and balance in order to decrease fall risk and improve function at home and work.  Baseline: need to initiate Goal status: INITIAL   LONG TERM GOALS: Target date: 04/21/2024  1.  Patient will complete five times sit to stand test in < 15 seconds indicating an increased LE strength and improved balance. Baseline:  38.75 seconds, using UE support  Goal status: INITIAL  2.  Patient will increase ABC scale score >80% to demonstrate better functional mobility and better confidence with ADLs.   Baseline: 7.5% Goal status: INITIAL   3.  Patient will increase Berg Balance score by > 6 points to demonstrate decreased fall risk during functional activities. Baseline: need to assess; 01/30/2024= 34/56 Goal status: INITIAL   4. Patient will increase 10 meter walk test to >1.47m/s as to  improve gait speed for better community ambulation and to reduce fall risk. Baseline: 0.48 m/s using youth RW, requires CGA for safety  Goal status: INITIAL  5. Patient will increase six minute walk test distance to >1000 for progression to community ambulator and improve gait ability Baseline: need to assess; 01/30/2024=410 feet using RW. Goal status: INITIAL    ASSESSMENT:  CLINICAL IMPRESSION: Patient is a 68 y.o. female who was seen today for physical therapy  treatment for R hemiparesis, R inattention, potential apraxia, and decreased functional mobility following Left middle cerebral artery stroke. Treatment limited secondary to late arrival - yet patient with good motivation and participated well with further testing today. She presents with impaired balance as seen by score of 34/56 on BERG balance test- indicating increased risk of falling. Based on score- advised her to continue to use her walker for now. Adjusted height of walker for better fit today.  Ms. Tremain will benefit from further skilled PT to improve these deficits in order to increase QOL, decrease fall risk, and ease/safety with ADLs.   OBJECTIVE IMPAIRMENTS: Abnormal gait, decreased activity tolerance,  decreased balance, decreased endurance, decreased knowledge of use of DME, decreased mobility, difficulty walking, decreased strength, decreased safety awareness, impaired vision/preception, and pain.   ACTIVITY LIMITATIONS: carrying, lifting, bending, standing, squatting, stairs, transfers, bed mobility, bathing, toileting, dressing, reach over head, hygiene/grooming, and locomotion level  PARTICIPATION LIMITATIONS: meal prep, cleaning, laundry, driving, shopping, and community activity  PERSONAL FACTORS: Age, Time since onset of injury/illness/exacerbation, and 3+ comorbidities: Hypertension, B12 deficiency, type 2 diabetes, hyperlipidemia, CKD stage IIIa are also affecting patient's functional outcome.   REHAB  POTENTIAL: Good  CLINICAL DECISION MAKING: Evolving/moderate complexity  EVALUATION COMPLEXITY: Moderate  PLAN:  PT FREQUENCY: 1-2x/week  PT DURATION: 12 weeks  PLANNED INTERVENTIONS: 97164- PT Re-evaluation, 97750- Physical Performance Testing, 97110-Therapeutic exercises, 97530- Therapeutic activity, [id number]- Neuromuscular re-education, 97535- Self Care, 16109- Manual therapy, [id number]- Gait training, [phone]- Orthotic Initial, [phone]- Orthotic/Prosthetic subsequent, [phone]- Canalith repositioning, [phone]- Electrical stimulation (manual), Patient/Family education, Balance training, Stair training, Taping, Joint mobilization, Spinal mobilization, Vestibular training, Visual/preceptual remediation/compensation, DME instructions, Cryotherapy, Moist heat, and Biofeedback  PLAN FOR NEXT SESSION:  - assess bed mobility & update - teach technique for increased independence -Initiate balance training both lower level static and some dynamic - FGA when appropriate and add LTG - initiate HEP - progressive R LE strengthening and NMR - gait training without AD      Ossie Blend, PT Physical Therapist - [facility]  9:18 AM 01/31/24

## 2024-02-01 ENCOUNTER — Encounter: Payer: Self-pay | Admitting: Registered Nurse

## 2024-02-01 ENCOUNTER — Encounter: Attending: Registered Nurse | Admitting: Registered Nurse

## 2024-02-01 VITALS — BP 109/76 | HR 85 | Ht <= 58 in | Wt 159.2 lb

## 2024-02-01 DIAGNOSIS — I63512 Cerebral infarction due to unspecified occlusion or stenosis of left middle cerebral artery: Secondary | ICD-10-CM | POA: Insufficient documentation

## 2024-02-01 DIAGNOSIS — I10 Essential (primary) hypertension: Secondary | ICD-10-CM | POA: Diagnosis present

## 2024-02-01 DIAGNOSIS — E7849 Other hyperlipidemia: Secondary | ICD-10-CM | POA: Diagnosis present

## 2024-02-01 NOTE — Progress Notes (Signed)
 Subjective:    Patient ID: Haley Jones, female    DOB: [birth year], 68 y.o.   MRN: [id number]  HPI: Jennah Satchell is a 68 y.o. female who is here for Transitional Care Visit for follow up of her Left middle cerebral artery stroke, Essential Hypertension and Hyperlipidemia. She presented to [facility] on 12/20/2023 with right sided weakness falls and dizziness.   Dr. Arnett: H&P  HPI: Kirstina Leinweber is a 68 y.o. female with medical history significant of Hypertension, B12 deficiency, type 2 diabetes, hyperlipidemia, CKD stage IIIa, who presents to the ED due to extremity weakness.   Mrs. Kuehne states that for the last 2-3 weeks, she has been experiencing dizziness.  During this time, she has been falling more often.  She saw her PCP regarding these concerns and was started on meclizine .  Her symptoms continued though.  Her sister came to visit yesterday and noticed that when patient reached for an object, her right hand would not go to the right place.  In addition, the right arm seemed weaker and patient endorsed numbness.  Mrs. Kerkman states that she noticed the symptoms, however cannot recall when they started.  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  MR: Brain: WO Contrast:  IMPRESSION: 1. Patchy acute cortical/subcortical infarcts within the left parietal and occipital lobes individually measuring up to 3 cm (MCA vascular territory). Subtle petechial hemorrhage associated with some of these infarcts. No frank hemorrhagic conversion. 2. Additional punctate left MCA territory cortical acute infarct within the left insula. 3. Background parenchymal atrophy and chronic small vessel ischemic disease with chronic lacunar infarcts, as described. 4. Paranasal sinus disease as outlined (including severe right maxillary sinusitis).  CTA: WO Contrast IMPRESSION: 1. Acute infarction affecting several adjacent gyri in  the left parietal region. Mild swelling but no mass effect or hemorrhage. 2. Severe stenosis or nonocclusive thrombus at the left MCA bifurcation affecting the inferior division. Nonocclusive embolus is visible within a left parietal branch as well. This would be M4. 3. Aortic atherosclerosis. 4. Soft plaque in the midportion of the left common carotid artery with stenosis of 25%. 5. Mild atherosclerotic change at the right carotid bifurcation but no stenosis.   Aortic Atherosclerosis (ICD10-I70.0).  Neurology Consulted: ASA and Plavix  x 90 days then Plavix  alone.   Ms. Mcfate was admitted to inpatient therapy on 12/28/2023  and discharged home 01/18/2024. She states her pain is occasionally in her lower back. She rates her pain 0. She is receiving Outpatient Therapy at [facility].      Pain Inventory Average Pain 0 Pain Right Now 0 My pain is N/A  LOCATION OF PAIN  N/A  BOWEL Number of stools per week: 2 Oral laxative use Yes  Type of laxative Mirlax or stool softener as needed Enema or suppository use No  History of colostomy No  Incontinent No  BLADDER Normal In and out cath, frequency No Able to self cath No  Bladder incontinence No  Frequent urination No  Leakage with coughing Yes  Difficulty starting stream No  Incomplete bladder emptying No    Mobility walk with assistance use a walker do you drive?  no  Function retired  Neuro/Psych bladder control problems weakness trouble walking I need assistance with bathing, meal prep, household duties, shopping First floor home with ramp, able to enter bathroom  Prior Studies Any changes since last visit?  no  Physicians involved in your  care Primary care Oneil Pinal, PCP Neurologist Dr.H Maree, Neurologist   Family History  Problem Relation Age of Onset   Melanoma Mother    Lung cancer Father    Ovarian cancer Maternal Grandmother    Breast cancer Neg Hx    Social  History   Socioeconomic History   Marital status: Widowed    Spouse name: Not on file   Number of children: Not on file   Years of education: Not on file   Highest education level: Not on file  Occupational History   Not on file  Tobacco Use   Smoking status: Never   Smokeless tobacco: Never  Vaping Use   Vaping status: Never Used  Substance and Sexual Activity   Alcohol use: No    Comment: rare   Drug use: No   Sexual activity: Yes  Other Topics Concern   Not on file  Social History Narrative   Not on file   Social Drivers of Health   Financial Resource Strain: Not on file  Food Insecurity: No Food Insecurity (12/20/2023)   Hunger Vital Sign    Worried About Running Out of Food in the Last Year: Never true    Ran Out of Food in the Last Year: Never true  Transportation Needs: No Transportation Needs (12/20/2023)   PRAPARE - Administrator, Civil Service (Medical): No    Lack of Transportation (Non-Medical): No  Physical Activity: Not on file  Stress: Not on file  Social Connections: Moderately Isolated (12/20/2023)   Social Connection and Isolation Panel    Frequency of Communication with Friends and Family: Twice a week    Frequency of Social Gatherings with Friends and Family: Once a week    Attends Religious Services: 1 to 4 times per year    Active Member of [employer] or Organizations: No    Attends Banker Meetings: Never    Marital Status: Widowed   Past Surgical History:  Procedure Laterality Date   ABDOMINAL HYSTERECTOMY  2008   ovaries also   CESAREAN SECTION  1994   COLONOSCOPY     COLONOSCOPY WITH PROPOFOL  N/A 02/12/2018   Procedure: COLONOSCOPY WITH PROPOFOL ;  Surgeon: Toledo, Ladell POUR, MD;  Location: [facility] ENDOSCOPY;  Service: Gastroenterology;  Laterality: N/A;   ESOPHAGOGASTRODUODENOSCOPY (EGD) WITH PROPOFOL  N/A 02/12/2018   Procedure: ESOPHAGOGASTRODUODENOSCOPY (EGD) WITH PROPOFOL ;  Surgeon: Toledo, Ladell POUR, MD;  Location: [facility]  ENDOSCOPY;  Service: Gastroenterology;  Laterality: N/A;   EYE SURGERY Left 1973   muscle shortening to straighten cross eyes   LAPAROSCOPY N/A 02/20/2018   Procedure: LAPAROSCOPY DIAGNOSTIC, ( RESECTION OF RIGHT LOWER QUADRANT ABDOMINAL MASS);  Surgeon: Nicholaus Selinda Birmingham, MD;  Location: [facility] ORS;  Service: General;  Laterality: N/A;   ROTATOR CUFF REPAIR Left 2011   Past Medical History:  Diagnosis Date   Adnexal mass    Anxiety    Chronic back pain    Depression    Diabetes mellitus without complication (HCC)    Endometriosis 03/12/2018   GERD (gastroesophageal reflux disease)    Heart murmur 02/2018   undetected until she was an adult. 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   Weight loss 12/06/2017   BP 109/76 (BP Location: Left Arm, Patient Position: Sitting, Cuff Size: Large)   Pulse 85   Ht 4' 9 (1.448 m)   Wt 159 lb 3.2 oz (  72.2 kg)   SpO2 96%   BMI 34.45 kg/m   Opioid Risk Score:   Fall Risk Score:  `1  Depression screen [facility] 2/9     02/01/2024    1:28 PM  Depression screen PHQ 2/9  Decreased Interest 3  Down, Depressed, Hopeless 1  PHQ - 2 Score 4  Altered sleeping 0  Tired, decreased energy 3  Change in appetite 0  Feeling bad or failure about yourself  3  Trouble concentrating 1  Moving slowly or fidgety/restless 0  Suicidal thoughts 1  PHQ-9 Score 12  Difficult doing work/chores Somewhat difficult      Review of Systems  Cardiovascular:        Hypertension  Gastrointestinal:  Positive for constipation.  Endocrine:       Diabetes Mellitus II High Blood Sugar  Musculoskeletal:  Positive for back pain and gait problem.       Walks with walker Back pain (not a surgical candidate) Lumbar Disc Disease  Neurological:  Positive for weakness.       Trouble walking, balance  All other systems reviewed and are negative.      Objective:   Physical Exam Vitals and  nursing note reviewed.  Constitutional:      Appearance: Normal appearance.   Cardiovascular:     Rate and Rhythm: Normal rate and regular rhythm.     Pulses: Normal pulses.     Heart sounds: Normal heart sounds.  Pulmonary:     Effort: Pulmonary effort is normal.     Breath sounds: Normal breath sounds.   Musculoskeletal:     Comments: Normal Muscle Bulk and Muscle Testing Reveals:  Upper Extremities: Right: Decreased ROM 45 Degrees and Muscle Strength 4/5 Let Upper Extremity: Decreased ROM 90 Degrees and Muscle Strength 3/5 Lower Extremities: Full ROM and Muscle Strength 5/5 Arises from Table slowly using walker or support Narrow Based  Gait      Skin:    General: Skin is warm and dry.   Neurological:     Mental Status: She is alert and oriented to person, place, and time.   Psychiatric:        Mood and Affect: Mood normal.        Behavior: Behavior normal.         Assessment & Plan:  Left middle cerebral artery stroke: She has a scheduled appointment with Neurology. Continue current medication regimen. Continue to monitor.  Essential Hypertension: PCP Following. Continue current medication regimen. Continue to monitor,   Hyperlipidemia: Continue current medication regimen. PCP following . Continue to monitor.   F/U in 4-6 weeks with Dr Emeline

## 2024-02-04 ENCOUNTER — Ambulatory Visit

## 2024-02-04 DIAGNOSIS — M6281 Muscle weakness (generalized): Secondary | ICD-10-CM

## 2024-02-04 DIAGNOSIS — R278 Other lack of coordination: Secondary | ICD-10-CM | POA: Diagnosis not present

## 2024-02-04 DIAGNOSIS — R269 Unspecified abnormalities of gait and mobility: Secondary | ICD-10-CM

## 2024-02-04 DIAGNOSIS — R2681 Unsteadiness on feet: Secondary | ICD-10-CM

## 2024-02-04 DIAGNOSIS — I69351 Hemiplegia and hemiparesis following cerebral infarction affecting right dominant side: Secondary | ICD-10-CM

## 2024-02-04 NOTE — Therapy (Signed)
 OUTPATIENT OCCUPATIONAL THERAPY NEURO EVALUATION  Patient Name: Haley Jones MRN: [id number] DOB:[birth year], 68 y.o., female Today's Date: 02/04/2024  PCP: Cleotilde Oneil FALCON, MD REFERRING PROVIDER: Pegge Toribio PARAS, PA-C   OT End of Session - 02/04/24 1420     Visit Number 3    Number of Visits 24    Date for OT Re-Evaluation 04/22/24    OT Start Time 1435    OT Stop Time 1520    OT Time Calculation (min) 45 min    Activity Tolerance Patient tolerated treatment well    Behavior During Therapy [facility] for tasks assessed/performed                 Past Medical History:  Diagnosis Date   Adnexal mass    Anxiety    Chronic back pain    Depression    Diabetes mellitus without complication (HCC)    Endometriosis 03/12/2018   GERD (gastroesophageal reflux disease)    Heart murmur 02/2018   undetected until she was an adult. 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   Weight loss 12/06/2017   Past Surgical History:  Procedure Laterality Date   ABDOMINAL HYSTERECTOMY  2008   ovaries also   CESAREAN SECTION  1994   COLONOSCOPY     COLONOSCOPY WITH PROPOFOL  N/A 02/12/2018   Procedure: COLONOSCOPY WITH PROPOFOL ;  Surgeon: Toledo, Ladell POUR, MD;  Location: [facility] ENDOSCOPY;  Service: Gastroenterology;  Laterality: N/A;   ESOPHAGOGASTRODUODENOSCOPY (EGD) WITH PROPOFOL  N/A 02/12/2018   Procedure: ESOPHAGOGASTRODUODENOSCOPY (EGD) WITH PROPOFOL ;  Surgeon: Toledo, Ladell POUR, MD;  Location: [facility] ENDOSCOPY;  Service: Gastroenterology;  Laterality: N/A;   EYE SURGERY Left 1973   muscle shortening to straighten cross eyes   LAPAROSCOPY N/A 02/20/2018   Procedure: LAPAROSCOPY DIAGNOSTIC, ( RESECTION OF RIGHT LOWER QUADRANT ABDOMINAL MASS);  Surgeon: Nicholaus Selinda Birmingham, MD;  Location: [facility] ORS;  Service: General;  Laterality: N/A;   ROTATOR CUFF REPAIR Left 2011   Patient Active Problem List   Diagnosis  Date Noted   Depression with anxiety 01/04/2024   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   Acute CVA (cerebrovascular accident) (HCC) 12/20/2023   Primary hypertension 12/20/2023   Acute kidney injury superimposed on chronic kidney disease (HCC) 12/20/2023   DM type 2 with diabetic mixed hyperlipidemia (HCC) 11/17/2022   Major depressive disorder, recurrent, 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  Rationale for Evaluation and Treatment: Rehabilitation  SUBJECTIVE:   SUBJECTIVE STATEMENT: Pt. Reports no pain today.   Pt accompanied by: self and family member  PERTINENT HISTORY: Pt. Is a 68 yo female with hx of a L MCA CVA. Pt. Was admitted into ED on 12/20/2023 with an onset of RUE weakness, numbness dysmetria, and visual deficits. Upon assessment, it was concluded that Pt. Experienced multiple infarcts in the L Parietal/Occipital region. PMHx: Dizziness, Falls  PRECAUTIONS: None  WEIGHT BEARING RESTRICTIONS: No  PAIN:  Are you having pain? No  FALLS: Has patient fallen in last 6 months? Yes. Number  of falls 10  LIVING ENVIRONMENT: Lives with: lives with their family and lives alone Lives in: House/apartment Stairs: No, Ramp Has following equipment at home: Walker - 2 wheeled, Shower bench, and bed side commode  PLOF: Independent  PATIENT GOALS: To be able to feed herself and hold a fork and knife better.   OBJECTIVE:  Note: Objective measures were completed at Evaluation unless otherwise noted.  HAND DOMINANCE: Left  ADLs: Overall ADLs:  Transfers/ambulation related  to ADLs:  Eating: Independent  Grooming: independent UB Dressing: Pt. Has difficulty managing buttons LB Dressing: jean zippers are difficult to manipulate, hiking pants, and clothing negotiation are difficult. Toileting: Independent Bathing: Independent Tub Shower transfers: stepping into shower is difficult.  Equipment: Grab bars, shower bench, bedside commode  IADLs: Shopping: Does not currently do that/has not tried Light housekeeping: Does not currently do that/has not tried Meal Prep: Has not cooked in months, did not prior to CVA, sister provides meals. Community mobility: Relies on family, and friends Medication management: Assistance from sister to initiate medication management, she gives herself a shot for diabetes Financial management: Sister is assisting with monthly bill management Handwriting: Cursive form is 10% legible; Printed form is 90% legible  MOBILITY STATUS: Needs Assist: CGA and Hx of falls  POSTURE COMMENTS:  No Significant postural limitations Sitting balance: Good  ACTIVITY TOLERANCE: Activity tolerance: Good  FUNCTIONAL OUTCOME MEASURES: MAM-20: TBD  UPPER EXTREMITY ROM:    Active ROM Right eval Left Eval [facility]  Shoulder flexion 50(58)   Shoulder abduction 43(48)   Shoulder adduction    Shoulder extension    Shoulder internal rotation    Shoulder external rotation    Elbow flexion 118(131)   Elbow extension -24(-22)   Wrist flexion 20 (21)   Wrist extension 30(31)   Wrist ulnar deviation    Wrist radial deviation    Wrist pronation    Wrist supination    (Blank rows = not tested)  UPPER EXTREMITY MMT:     MMT Right eval Left eval  Shoulder flexion 2/5 WFL  Shoulder abduction 2/5 [facility]  Shoulder adduction    Shoulder extension    Shoulder internal rotation    Shoulder external rotation    Middle trapezius    Lower trapezius    Elbow flexion 4-/5 WFL  Elbow extension 4-/5 WFL  Wrist flexion 2+/5 WFL  Wrist extension 3-/5 WFL   Wrist ulnar deviation    Wrist radial deviation    Wrist pronation    Wrist supination    (Blank rows = not tested)  HAND FUNCTION: Grip strength: Right: 21 lbs; Left: 14 lbs, Lateral pinch: Right: 7 lbs, Left: 4 lbs, and 3 point pinch: Right: 4 lbs, Left: 5 lbs  COORDINATION: 9 Hole Peg test: Right: 72 sec; Left: 40 sec  SENSATION: Light touch: Impaired  Proprioception: Impaired   EDEMA: none  MUSCLE TONE: RUE: Mild  COGNITION: Overall cognitive status: Impaired  VISION: Subjective report: Pt. Has not noticed the changes in vision but her sister has. Caregiver reports that Pt. Does not bring much attention to the R side.  Baseline vision:  Visual history:   VISION ASSESSMENT: TBD   PERCEPTION: TBD  PRAXIS: Impaired: Motor planning  TREATMENT DATE: 02/04/2024   Neuromuscular Re-education:  --Facilitated FMC shuffling and dealing cards while alternating directions to incorporate thumb on fingers/fingers on  thumb motion in the development of fine motor coordination skills. Worked on progressively increasing the speed while maintaining control.  -Pt sorted cards by suit with <10 % errors. MIN cues to sort cards in ascending numerical order, difficulty noted locating cards in far R visual field.   Therapeutic Activity:  - Pt completed the Trail Making Test (TMT), one of the most widely used tests for cognitive impairment, measures several functions including cognitive flexibility, alternating attention, sequencing, visual search, and motor speed. Pt scored 23/25 correct on Trial Making Test Part A in 50  sec and 22/24 correct on Trail Making Test Part B in 4 min 19 sec. These scores demonstrate mild impairment. Pt required min cues to correctly sequence alternating Letters/#s and   -Utilized 100 Peg Board to copy design pattern of pegs - required  constant cueing to follow pattern with step by step cues and poor recall of prior instruction. Intermittent cues to utilize non-dominant R hand to place pegs with no pegs dropped this session. Appears to require increased cueing to locate pegs in top R corner of pegboard.  -Removed pegs from peg board with no cues to utilize R hand, attempted to store x2 pegs in hand, achieves- ~50% without dropping.   Therapeutic Exercise:  - Lateral and 3pt. Pinch strengthening using yellow, red, and green level resistive clips, intermittent cues to hold clip correctly (attempts to hold one prong only for 5th and 6th clips only).  -EZ Board exercises performed to target forearm supination/pronation, wrist flexion/extension using gross grasp, and lateral pinch (key) grasp in multiple  planes to promote R shoulder flexion, abduction, and wrist flexion, and extension while performing resistive wrist flexion and extension with a gross grip.    PATIENT EDUCATION: Education details: Grip/Pinch strength, POC, ROM and BUE strength, BUE motor control  Person educated: Patient and Sister Education method: Explanation, Demonstration, Tactile cues, and Verbal cues Education comprehension: verbalized understanding and returned demonstration  HOME EXERCISE PROGRAM: Yellow theraputty HEP   GOALS: Goals reviewed with patient? Yes  SHORT TERM GOALS: Target date: 03/10/2024    Pt. Will be independent utilizing HEPs for hand strengthening exercises.  Baseline: Eval: No current HEP program Goal status: INITIAL   LONG TERM GOALS: Target date: 04/21/2024   Pt. Will improve BUE grip strength by 5# of force to be able to securely hold items. Baseline: Eval: R grip strength: 21#, L grip strength: 14# Goal status: INITIAL  2.  Pt. Will improve RUE [facility] skills by 3 sec. to be able to manipulate zippers/buttons. Baseline: 9 hole peg test; R side: 72 secs, L side: 40 secs Goal status: INITIAL  3.  Pt. Will increase R  shoulder flex/shoulder ABD ROM by 10 degrees to perform ADLs/IADLs.  Baseline: R shoulder flex: 50(58), R shoulder ABD: 43(48) Goal status: INITIAL  4.  Pt. Will improve handwriting to 50% legible in cursive form, legibility to be able to send written correspondence Baseline: 10% legible in cursive form, 90% legible in printed form. Goal status: INITIAL  5.  Pt. Will improve BUE pinch strength by 2# of force to  be able to assist with hiking pants Baseline: R Lateral Pinch: 7, R 3 pt. Pinch: 4, L Lateral Pinch: 4, L 3 pt. Pinch: 5 Goal status: INITIAL  6.  Pt. Will increase R wrist extension/flexion by 5 degrees in anticipation  of reaching hold of items for ADLs. Baseline: R Wrist ext: 20(21), R Wrist flex: 30(31) Goal status: INITIAL  ASSESSMENT:  CLINICAL IMPRESSION: No pain this session, good tolerance for exercises. Cues t/o session for technique, use of R hand, and command following - noted difficulty with dual tasking and alternating attention. Suspect R peripheral vision deficit due to difficulty locating cards on far R and errors on trail making test. Completed Trail Making Test Part B in 4 min 19 sec with cues to assist locating items in R upper quadrant. Pt. Will benefit from OT services to work on RUE ROM, RUE strengthening,  bilateral grip strength,  bilateral pinch strength, bilateral FMC skills, and R side awareness and attention in order to improve functional independence of ADL/IADL tasks at home.   PERFORMANCE DEFICITS: in functional skills including ADLs, IADLs, coordination, dexterity, proprioception, sensation, tone, ROM, strength, pain, Fine motor control, Gross motor control, endurance, and UE functional use, and psychosocial skills including environmental adaptation, habits, interpersonal interactions, and routines and behaviors.   IMPAIRMENTS: are limiting patient from ADLs, IADLs, rest and sleep, work, leisure, and social participation.   CO-MORBIDITIES: may have  co-morbidities  that affects occupational performance. Patient will benefit from skilled OT to address above impairments and improve overall function.  MODIFICATION OR ASSISTANCE TO COMPLETE EVALUATION: Min-Moderate modification of tasks or assist with assess necessary to complete an evaluation.  OT OCCUPATIONAL PROFILE AND HISTORY: Detailed assessment: Review of records and additional review of physical, cognitive, psychosocial history related to current functional performance.  CLINICAL DECISION MAKING: Moderate - several treatment options, min-mod task modification necessary  REHAB POTENTIAL: Good  EVALUATION COMPLEXITY: Moderate    PLAN:  OT FREQUENCY: 2x/week  OT DURATION: 12 weeks  PLANNED INTERVENTIONS: 97535 self care/ADL training, 02889 therapeutic exercise, 97530 therapeutic activity, 97112 neuromuscular re-education, 97018 paraffin, 02989 moist heat, 97010 cryotherapy, 97034 contrast bath, 97032 electrical stimulation (manual), passive range of motion, visual/perceptual remediation/compensation, energy conservation, patient/family education, and DME and/or AE instructions  RECOMMENDED OTHER SERVICES: ST & PT  CONSULTED AND AGREED WITH PLAN OF CARE: Patient and family member/caregiver  PLAN FOR NEXT SESSION: treatment  Elston Slot, M.S. OTR/L  02/04/24, 2:35 PM  ascom [phone]  02/04/2024, 2:35 PM

## 2024-02-04 NOTE — Therapy (Signed)
 OUTPATIENT PHYSICAL THERAPY TREATMENT   Patient Name: Haley Jones MRN: [id number] DOB:Oct 17, 1955, 68 y.o., female Today's Date: 02/04/2024   PCP: Dr. Oneil Pinal REFERRING PROVIDER: Toribio Pitch, PA-C  END OF SESSION:   PT End of Session - 02/04/24 1403     Visit Number 3    Number of Visits 24    Date for PT Re-Evaluation 04/21/24    Authorization Type [employer]    Progress Note Due on Visit 10    PT Start Time 1400    PT Stop Time 1440    PT Time Calculation (min) 40 min    Equipment Utilized During Treatment Gait belt    Activity Tolerance Patient tolerated treatment well;No increased pain    Behavior During Therapy [facility] for tasks assessed/performed           Past Medical History:  Diagnosis Date   Adnexal mass    Anxiety    Chronic back pain    Depression    Diabetes mellitus without complication (HCC)    Endometriosis 03/12/2018   GERD (gastroesophageal reflux disease)    Heart murmur 02/2018   undetected until she was an adult. 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   Weight loss 12/06/2017   Past Surgical History:  Procedure Laterality Date   ABDOMINAL HYSTERECTOMY  2008   ovaries also   CESAREAN SECTION  1994   COLONOSCOPY     COLONOSCOPY WITH PROPOFOL  N/A 02/12/2018   Procedure: COLONOSCOPY WITH PROPOFOL ;  Surgeon: Toledo, Ladell POUR, MD;  Location: [facility] ENDOSCOPY;  Service: Gastroenterology;  Laterality: N/A;   ESOPHAGOGASTRODUODENOSCOPY (EGD) WITH PROPOFOL  N/A 02/12/2018   Procedure: ESOPHAGOGASTRODUODENOSCOPY (EGD) WITH PROPOFOL ;  Surgeon: Toledo, Ladell POUR, MD;  Location: [facility] ENDOSCOPY;  Service: Gastroenterology;  Laterality: N/A;   EYE SURGERY Left 1973   muscle shortening to straighten cross eyes   LAPAROSCOPY N/A 02/20/2018   Procedure: LAPAROSCOPY DIAGNOSTIC, ( RESECTION OF RIGHT LOWER QUADRANT ABDOMINAL MASS);  Surgeon: Nicholaus Selinda Birmingham,  MD;  Location: [facility] ORS;  Service: General;  Laterality: N/A;   ROTATOR CUFF REPAIR Left 2011   Patient Active Problem List   Diagnosis Date Noted   Depression with anxiety 01/04/2024   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   Acute CVA (cerebrovascular accident) (HCC) 12/20/2023   Primary hypertension 12/20/2023   Acute kidney injury superimposed on chronic kidney disease (HCC) 12/20/2023   DM type 2 with diabetic mixed hyperlipidemia (HCC) 11/17/2022   Major depressive disorder, recurrent, 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/17/2022   B12 deficiency 02/23/2021   SBO (small bowel obstruction) (HCC) 01/29/2019   Barrett's esophagus without dysplasia 04/15/2018   Weight loss 12/06/2017   Hoarse 12/06/2017   Lumbar disc disease 07/26/2017   Diabetes mellitus type 2, controlled, without complications (HCC) 02/09/2015   Surgical menopause 11/26/2014    ONSET DATE: 12/20/2023  REFERRING DIAG: P36.487 (ICD-10-CM) - Left middle cerebral artery stroke (HCC)   THERAPY DIAG:  Muscle weakness (generalized)  Abnormality of gait  Unsteadiness on feet  Rationale for Evaluation and Treatment: Rehabilitation  SUBJECTIVE:  SUBJECTIVE STATEMENT: I am doing okay. I just lost my daughter who had special needs. Reports her sister is coming by daily to assist with ADL's, meals, and meds.  PERTINENT HISTORY:Pt states she is not sure the exact date of when her CVA happened because she kept falling at home trying to care for herself and her daughter, but she finally went to the hospital when her sister insisted on it. Pt states her special needs daughter, [name], passed away recently while pt was in the hospital at [facility] before she was transferred to [facility] for stroke rehabilitation.Pt's sister states pt requires  supervision/cuing for ADLs  to don clothes correctly, with correct orientation (potential apraxia?) Pt/sister report pt has R side inattention.Pt reports supposedly one of her legs is shorter than the other, believes her R LE might be the shorter one. 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     PAIN:  Are you having pain? No   PRECAUTIONS: Fall  RED FLAGS: None   WEIGHT BEARING RESTRICTIONS: No  FALLS: Has patient fallen in last 6 months? Yes. Number of falls 6+ before going to hospital and 1 fall since being home from CIR - pt states she was trying to gather her clothes by herself rather than waiting for her sister to get there to help her  LIVING ENVIRONMENT Lives with: lives alone and but pt's sister, [name], comes over daily to assist with ADLs and stays to provide supervision all day - pt states at night she would be able to walk to bathroom using RW if needed (but she wears depends in event of incontinence) Lives in: House/apartment Stairs: she has steps, but she doesn't have to use them (house was wheelchair accessible for her daughter)  Has following equipment at home: Environmental consultant - 2 wheeled, shower chair, Grab bars, Ramped entry, and transport chair  PLOF: Independent, Independent with household mobility without device, Independent with homemaking with ambulation, Independent with gait, Independent with transfers, and she was the primary caregiver for her recently deceased daughter   CLOF: Sister provides supervision daily for pt to ambulate using RW safely and provides assist for ADLs (showering and dressing - progressing towards supervision with these activities); Sister states pt requires cues to turn and step back fully prior to sitting to ensure her safety  PATIENT GOALS: get back to being able to go shopping with improved community level ambulation without the walker; return to driving   OBJECTIVE:  Note: Objective  measures were completed at Evaluation unless otherwise noted.  DIAGNOSTIC FINDINGS:   EXAM: MRI HEAD WITHOUT CONTRAST   TECHNIQUE: Multiplanar, multiecho pulse sequences of the brain and surrounding structures were obtained without intravenous contrast.   COMPARISON:  Brain MRI 07/31/2022.  IMPRESSION: 1. Patchy acute cortical/subcortical infarcts within the left parietal and occipital lobes individually measuring up to 3 cm (MCA vascular territory). Subtle petechial hemorrhage associated with some of these infarcts. No frank hemorrhagic conversion. 2. Additional punctate left MCA territory cortical acute infarct within the left insula. 3. Background parenchymal atrophy and chronic small vessel ischemic disease with chronic lacunar infarcts, as described. 4. Paranasal sinus disease as outlined (including severe right maxillary sinusitis).   Electronically Signed   By: Rockey Childs D.O.   On: 12/20/2023 11:52  COGNITION: Overall cognitive status: Within functional limits for tasks assessed   SENSATION: Light touch: initially misses the first 1-2 touches on R LE, but then able to  feel remaining touches Proprioception: Impaired grossly  COORDINATION: Symmetrical heel-to-shin bilaterally  EDEMA:  Not formally assessed, but none observed  MUSCLE TONE: Not formally assessed  POSTURE: rounded shoulders, forward head, and posterior pelvic tilt  LOWER EXTREMITY MMT:    MMT Right Eval Left Eval  Hip flexion 3+, no pain 3+ with some L hip and back pain with this  Hip extension    Hip abduction    Hip adduction    Hip internal rotation    Hip external rotation    Knee flexion 3+ 4  Knee extension 4- 4  Ankle dorsiflexion 3+ 4  Ankle plantarflexion 4- 4  Ankle inversion    Ankle eversion    (Blank rows = not tested)  BED MOBILITY:  Findings: Sit to supine need to assess Supine to sit need to assess *pt reports some difficulty with bed mobility  TRANSFERS: Sit  to stand: SBA  Assistive device utilized: Environmental consultant - 2 wheeled     Stand to sit: SBA  Assistive device utilized: Environmental consultant - 2 wheeled     Chair to chair: SBA  Assistive device utilized: Environmental consultant - 2 wheeled       GAIT: Findings: Gait Characteristics: slight R ankle instability noted, step through pattern, decreased stance time- Right, decreased stride length, and decreased ankle dorsiflexion- Right, Distance walked: ~118ft, Assistive device utilized:Walker - 2 wheeled, Level of assistance: SBA and CGA, and Comments:    FUNCTIONAL TESTS:  5 times sit to stand: 38.75 seconds, using UE support 10 meter walk test: 0.48 m/s using youth RW, requires CGA for safety 6 minute walk test: Visit 2, see note Berg Balance Scale: Visit 2, see note Functional gait assessment: need to assess, when appropriate  PATIENT SURVEYS:  ABC scale 7.5%                                                                                                                             TREATMENT DATE: 02/04/2024 -STS from chair x10 to RW  -seated alternate finger to CL medial malleolous 1x16, alternating sides  -STS from chair to RW, feet on aerobic step x10 (cues for one hand on chair arm)  -overground AMB c RW x181ft, minAGuard to supervision -chair-sled push: 2x28ft @ 0lb, 2x23ft @ 30lb, 1x69ft @ 60lb (High intensity dialed in)  -AMB from hallway to OT without device, minGuard assist, no LOB    PATIENT EDUCATION: Education details: PT POC, findings on assessment today, recommendation to continue using RW for all functional mobility Person educated: Patient and Caregiver [name] Education method: Explanation Education comprehension: verbalized understanding and needs further education  HOME EXERCISE PROGRAM: Access Code: [email or web address] URL: [email or web address] Date: 02/04/2024 Prepared by: Peggye Linear  Exercises - Standing March with Counter Support  - 2 x daily - 7 x weekly - 2 sets - 20 reps - Side stepping  with counter support: yellow band loop  - 2 x daily - 7 x weekly - 2  sets - 20 reps - Sit to Stand with Counter Support  - 2 x daily - 7 x weekly - 2 sets - 12 reps   GOALS: Goals reviewed with patient? Yes  SHORT TERM GOALS: Target date: 03/10/2024  Pt will be independent with HEP in order to improve strength and balance in order to decrease fall risk and improve function at home and work.  Baseline: need to initiate Goal status: INITIAL  LONG TERM GOALS: Target date: 04/21/2024  1.  Patient will complete five times sit to stand test in < 15 seconds indicating an increased LE strength and improved balance. Baseline:  38.75 seconds, using UE support  Goal status: INITIAL  2.  Patient will increase ABC scale score >80% to demonstrate better functional mobility and better confidence with ADLs.   Baseline: 7.5% Goal status: INITIAL   3.  Patient will increase Berg Balance score by > 6 points to demonstrate decreased fall risk during functional activities. Baseline: need to assess; 01/30/2024= 34/56 Goal status: INITIAL   4. Patient will increase 10 meter walk test to >1.54m/s as to improve gait speed for better community ambulation and to reduce fall risk. Baseline: 0.48 m/s using youth RW, requires CGA for safety  Goal status: INITIAL  5. Patient will increase six minute walk test distance to >1000 for progression to community ambulator and improve gait ability Baseline: need to assess; 01/30/2024=410 feet using RW. Goal status: INITIAL   ASSESSMENT:  CLINICAL IMPRESSION: Advanced intensity of interventions today and incorporated more challenging-to-stability degrees of posture. Pt able to integrate high intensity intervals with sled push in hall, also requires focussed effort to maintain equal load through all 4 limbs lest she deviate from a straight line of progression. HEP issued with handout at end of session. Session ended early due to author error. Ms. Touchton will benefit from  further skilled PT to improve these deficits in order to increase QOL, decrease fall risk, and ease/safety with ADLs.   OBJECTIVE IMPAIRMENTS: Abnormal gait, decreased activity tolerance, decreased balance, decreased endurance, decreased knowledge of use of DME, decreased mobility, difficulty walking, decreased strength, decreased safety awareness, impaired vision/preception, and pain.   ACTIVITY LIMITATIONS: carrying, lifting, bending, standing, squatting, stairs, transfers, bed mobility, bathing, toileting, dressing, reach over head, hygiene/grooming, and locomotion level  PARTICIPATION LIMITATIONS: meal prep, cleaning, laundry, driving, shopping, and community activity  PERSONAL FACTORS: Age, Time since onset of injury/illness/exacerbation, and 3+ comorbidities: Hypertension, B12 deficiency, type 2 diabetes, hyperlipidemia, CKD stage IIIa are also affecting patient's functional outcome.   REHAB POTENTIAL: Good  CLINICAL DECISION MAKING: Evolving/moderate complexity  EVALUATION COMPLEXITY: Moderate  PLAN:  PT FREQUENCY: 1-2x/week  PT DURATION: 12 weeks  PLANNED INTERVENTIONS: 97164- PT Re-evaluation, 97750- Physical Performance Testing, 97110-Therapeutic exercises, 97530- Therapeutic activity, 97112- Neuromuscular re-education, 97535- Self Care, [id number]- Manual therapy, [phone]- Gait training, [phone]- Orthotic Initial, [phone]- Orthotic/Prosthetic subsequent, [phone]- Canalith repositioning, [phone]- Electrical stimulation (manual), Patient/Family education, Balance training, Stair training, Taping, Joint mobilization, Spinal mobilization, Vestibular training, Visual/preceptual remediation/compensation, DME instructions, Cryotherapy, Moist heat, and Biofeedback  PLAN FOR NEXT SESSION:  - FGA when appropriate and add LTG - review/update HEP - continue HIIT - continue gait training without AD for balance and righting (include multi plane, multifdirction)    2:10 PM, 02/04/24 Peggye JAYSON Linear, PT,  DPT Physical Therapist - [facility]  Outpatient Physical Therapy- [facility] [phone]

## 2024-02-06 ENCOUNTER — Ambulatory Visit

## 2024-02-06 ENCOUNTER — Ambulatory Visit: Admitting: Occupational Therapy

## 2024-02-07 ENCOUNTER — Ambulatory Visit

## 2024-02-07 ENCOUNTER — Ambulatory Visit: Admitting: Physical Therapy

## 2024-02-07 DIAGNOSIS — R278 Other lack of coordination: Secondary | ICD-10-CM

## 2024-02-07 DIAGNOSIS — M6281 Muscle weakness (generalized): Secondary | ICD-10-CM

## 2024-02-07 DIAGNOSIS — R269 Unspecified abnormalities of gait and mobility: Secondary | ICD-10-CM

## 2024-02-07 DIAGNOSIS — R2681 Unsteadiness on feet: Secondary | ICD-10-CM

## 2024-02-07 DIAGNOSIS — R41841 Cognitive communication deficit: Secondary | ICD-10-CM

## 2024-02-07 DIAGNOSIS — I69351 Hemiplegia and hemiparesis following cerebral infarction affecting right dominant side: Secondary | ICD-10-CM

## 2024-02-07 NOTE — Therapy (Signed)
 OUTPATIENT SPEECH LANGUAGE PATHOLOGY  COGNITIVE-COMMUNICATION TREATMENT   Patient Name: Haley Jones MRN: [id number] DOB:January 13, 1956, 68 y.o., female Today's Date: 02/07/2024  PCP: Cleotilde Oneil FALCON, MD  REFERRING PROVIDER: Pegge Toribio PARAS, PA-C    End of Session - 02/07/24 1357     Visit Number 3    Number of Visits 25    Date for SLP Re-Evaluation 04/27/24    SLP Start Time 1400    SLP Stop Time  1445    SLP Time Calculation (min) 45 min    Activity Tolerance Patient tolerated treatment well          Past Medical History:  Diagnosis Date   Adnexal mass    Anxiety    Chronic back pain    Depression    Diabetes mellitus without complication (HCC)    Endometriosis 03/12/2018   GERD (gastroesophageal reflux disease)    Heart murmur 02/2018   undetected until she was an adult. 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   Weight loss 12/06/2017   Past Surgical History:  Procedure Laterality Date   ABDOMINAL HYSTERECTOMY  2008   ovaries also   CESAREAN SECTION  1994   COLONOSCOPY     COLONOSCOPY WITH PROPOFOL  N/A 02/12/2018   Procedure: COLONOSCOPY WITH PROPOFOL ;  Surgeon: Toledo, Ladell POUR, MD;  Location: [facility] ENDOSCOPY;  Service: Gastroenterology;  Laterality: N/A;   ESOPHAGOGASTRODUODENOSCOPY (EGD) WITH PROPOFOL  N/A 02/12/2018   Procedure: ESOPHAGOGASTRODUODENOSCOPY (EGD) WITH PROPOFOL ;  Surgeon: Toledo, Ladell POUR, MD;  Location: [facility] ENDOSCOPY;  Service: Gastroenterology;  Laterality: N/A;   EYE SURGERY Left 1973   muscle shortening to straighten cross eyes   LAPAROSCOPY N/A 02/20/2018   Procedure: LAPAROSCOPY DIAGNOSTIC, ( RESECTION OF RIGHT LOWER QUADRANT ABDOMINAL MASS);  Surgeon: Nicholaus Selinda Birmingham, MD;  Location: [facility] ORS;  Service: General;  Laterality: N/A;   ROTATOR CUFF REPAIR Left 2011   Patient Active Problem List   Diagnosis Date Noted   Depression with anxiety  01/04/2024   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   Acute CVA (cerebrovascular accident) (HCC) 12/20/2023   Primary hypertension 12/20/2023   Acute kidney injury superimposed on chronic kidney disease (HCC) 12/20/2023   DM type 2 with diabetic mixed hyperlipidemia (HCC) 11/17/2022   Major depressive disorder, recurrent, 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/17/2022   B12 deficiency 02/23/2021   SBO (small bowel obstruction) (HCC) 01/29/2019   Barrett's esophagus without dysplasia 04/15/2018   Weight loss 12/06/2017   Hoarse 12/06/2017   Lumbar disc disease 07/26/2017   Diabetes mellitus type 2, controlled, without complications (HCC) 02/09/2015   Surgical menopause 11/26/2014    ONSET DATE: 12/20/23   REFERRING DIAG: CVA  THERAPY DIAG:  Cognitive communication deficit  Rationale for Evaluation and Treatment Rehabilitation  SUBJECTIVE:   SUBJECTIVE STATEMENT: Pt alert, cooperative. Talkative, ?hyperverbal. Pt accompanied by: self  PERTINENT HISTORY: Macario POUR. Cammack is a 68 year old left-handed female who [facility] to [facility]23 with right side weakness/falls and dizziness. MRI of the brain showed patchy acute cortical/subcortical infarcts within the left parietal occipital lobes individually measuring up to 3 cm (MCA vascular territory). Subtle petechial hemorrhage associated with some of these infarcts.  No frank hemorrhagic conversion.  Additional punctate left MCA territory cortical acute infarct within the left insula.  Background parenchymal atrophy and chronic small vessel ischemic disease with chronic lacunar infarcts.  CT angio showed severe stenosis/nonocclusive thrombus of the  left MCA bifurcation affecting the inferior division.  Nonocclusive embolus also visible within the left parietal branch as well. Echocardiogram ejection fraction of 60 to 65% no wall motion abnormalities grade 1 diastolic dysfunction. Past medical history also noted for hypertension, B12 deficiency, type 2  diabetes mellitus, hyperlipidemia, CKD stage III, depression.  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 D/c home after CIR admission 12/28/23-01/18/24. Sister lives nearby and checks on patient throughout the day, assists with meals, bathing, and medications.  DIAGNOSTIC FINDINGS: see above  PAIN:  Are you having pain? No   FALLS: Has patient fallen in last 6 months?  Yes, Number of falls: 1 since returning home from rehab, multiple prior to hospitalization  LIVING ENVIRONMENT: Lives with: lives alone Lives in: House/apartment  PLOF:  Level of assistance: Independent with IADLs Employment: Retired   PATIENT GOALS   Pt states she wants to drive again  OBJECTIVE:    TODAY'S TREATMENT: Endorsed fall while walking with walker to kitchen while holding a container. Discussed safer alternatives (e.g. wastepaper basket near where she eats).   Introduced stop-think-do as a strategy to improve executive functioning.  Pt sequenced 3-4 step ADLs with ~94% accuracy indep; improving to 100% with min/mod cues to slow down, read all options, and check for errors.   Pt educated on changes to cognition following stroke as well as focus on meta cognition (thinking about how we think).    PATIENT EDUCATION: Education details: as above Person educated: Patient Education method: Verbal cues Education comprehension: verbalized understanding and needs further education   HOME EXERCISE PROGRAM:   To be developed during subsequent therapy sessions   GOALS:  Goals reviewed with patient? Yes  SHORT TERM GOALS: Target date: 10 sessions  The patient will sustain attention in simple cognitive linguistic task for 5 minutes given min verbal cues.  Baseline: Goal status: INITIAL  2.  Patient will reduce impulsivity by preplanning steps before completing a task with moderate verbal cues. Baseline:  Goal status:  INITIAL  3.  Patient will complete Patient Reported Outcome Measure to assess self-perception of deficits.  Baseline:  Goal status: INITIAL   LONG TERM GOALS: Target date: 04/27/2024  Patient will alternate attention between two moderately complex tasks 80% acc, with min verbal cues.  Baseline:  Goal status: INITIAL  2.  Patient will demonstrate emergent awareness by identifying 75% of errors with min cues for double checking. Baseline:  Goal status: INITIAL  3.  Patient will recall safety precautions/mobility recommendations using visual aids if necessary to reduce risk for falls. Baseline:  Goal status: INITIAL   ASSESSMENT:  CLINICAL IMPRESSION: Based on recent assessment, Matilda Fleig presents with overall moderate cognitive communication impairment per standardized testing using the Cognitive Linguistic Quick Test. Primary deficit areas (severe) include attention, executive function, and visuospatial skills. Patient's impulsivity contributed significantly to testing errors, as did poor sustained attention to testing instructions. Impulsivity impacting safety at home, contributing to fall at home. Patient and sister report reduced frustration tolerance. She is often frustrated when being given instructions. Patient demonstrated some emergent awareness of errors on tasks such as mazes, but was unaware of perseverations in design generation and generative naming, and errors in clock drawing, symbol cancellation, and symbol trails tasks. See details of today's treatment session above. Recommend skilled ST to target cognitive communication impairments to increase safety and independence.   OBJECTIVE IMPAIRMENTS include attention, memory, awareness, and executive functioning.  These impairments are limiting patient from managing medications, managing appointments, managing finances, household responsibilities, ADLs/IADLs, and effectively communicating at home and in community. Factors  affecting potential to achieve goals and functional outcome are ability to learn/carryover information and severity of impairments. Patient will benefit from skilled SLP services to address above impairments and improve overall function.  REHAB POTENTIAL: Good  PLAN: SLP FREQUENCY: 1-2x/week  SLP DURATION: 12 weeks  PLANNED INTERVENTIONS: Environmental controls, Cueing hierachy, Cognitive reorganization, Internal/external aids, Functional tasks, SLP instruction and feedback, Compensatory strategies, Patient/family education, and [id number] Treatment of speech (30 or 45 min)    Delon Bangs, M.S., CCC-SLP Speech-Language Pathologist [facility] [phone] [place])   [facility] at [facility] [address] [place], [place], [address] Phone: [phone]   Fax:  [phone]

## 2024-02-07 NOTE — Therapy (Signed)
 OUTPATIENT OCCUPATIONAL THERAPY NEURO EVALUATION  Patient Name: Haley Jones MRN: [id number] DOB:24-Jul-1956, 68 y.o., female Today's Date: 02/07/2024  PCP: Cleotilde Oneil FALCON, MD REFERRING PROVIDER: Pegge Toribio PARAS, PA-C   OT End of Session - 02/07/24 1501     Visit Number 4    Number of Visits 24    Date for OT Re-Evaluation 04/22/24    OT Start Time 1530    OT Stop Time 1615    OT Time Calculation (min) 45 min    Activity Tolerance Patient tolerated treatment well    Behavior During Therapy [facility] for tasks assessed/performed                 Past Medical History:  Diagnosis Date   Adnexal mass    Anxiety    Chronic back pain    Depression    Diabetes mellitus without complication (HCC)    Endometriosis 03/12/2018   GERD (gastroesophageal reflux disease)    Heart murmur 02/2018   undetected until she was an adult. 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   Weight loss 12/06/2017   Past Surgical History:  Procedure Laterality Date   ABDOMINAL HYSTERECTOMY  2008   ovaries also   CESAREAN SECTION  1994   COLONOSCOPY     COLONOSCOPY WITH PROPOFOL  N/A 02/12/2018   Procedure: COLONOSCOPY WITH PROPOFOL ;  Surgeon: Toledo, Ladell POUR, MD;  Location: [facility] ENDOSCOPY;  Service: Gastroenterology;  Laterality: N/A;   ESOPHAGOGASTRODUODENOSCOPY (EGD) WITH PROPOFOL  N/A 02/12/2018   Procedure: ESOPHAGOGASTRODUODENOSCOPY (EGD) WITH PROPOFOL ;  Surgeon: Toledo, Ladell POUR, MD;  Location: [facility] ENDOSCOPY;  Service: Gastroenterology;  Laterality: N/A;   EYE SURGERY Left 1973   muscle shortening to straighten cross eyes   LAPAROSCOPY N/A 02/20/2018   Procedure: LAPAROSCOPY DIAGNOSTIC, ( RESECTION OF RIGHT LOWER QUADRANT ABDOMINAL MASS);  Surgeon: Nicholaus Selinda Birmingham, MD;  Location: [facility] ORS;  Service: General;  Laterality: N/A;   ROTATOR CUFF REPAIR Left 2011   Patient Active Problem List   Diagnosis  Date Noted   Depression with anxiety 01/04/2024   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   Acute CVA (cerebrovascular accident) (HCC) 12/20/2023   Primary hypertension 12/20/2023   Acute kidney injury superimposed on chronic kidney disease (HCC) 12/20/2023   DM type 2 with diabetic mixed hyperlipidemia (HCC) 11/17/2022   Major depressive disorder, recurrent, 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  Rationale for Evaluation and Treatment: Rehabilitation  SUBJECTIVE:   SUBJECTIVE STATEMENT: Pt reports she fell yesterday when home alone trying to carry dishes back to the kitchen (using RW); fell backward and hit her head, no residual pain or deficits.   Pt accompanied by: self and family member  PERTINENT HISTORY: Pt. Is a 68 yo female with hx of a L MCA CVA. Pt. Was admitted into ED on 12/20/2023 with an onset of RUE weakness, numbness dysmetria, and visual deficits. Upon assessment, it was concluded that Pt. Experienced multiple infarcts in the L Parietal/Occipital region. PMHx: Dizziness, Falls  PRECAUTIONS: None  WEIGHT BEARING RESTRICTIONS: No  PAIN:  Are you having pain? No  FALLS: Has patient fallen in last 6 months? Yes. Number of falls 10  LIVING ENVIRONMENT: Lives with: lives with their family and lives alone Lives in: House/apartment Stairs: No, Ramp Has following equipment at home: Walker - 2 wheeled, Shower bench, and bed side commode  PLOF: Independent  PATIENT GOALS: To be able to feed herself and hold a fork and knife better.   OBJECTIVE:  Note: Objective  measures were completed at Evaluation unless otherwise noted.  HAND DOMINANCE: Left  ADLs: Overall ADLs:  Transfers/ambulation related to ADLs:  Eating: Independent  Grooming: independent UB Dressing: Pt. Has difficulty managing buttons LB Dressing: jean zippers are difficult to manipulate, hiking pants, and clothing negotiation are difficult. Toileting: Independent Bathing: Independent Tub Shower transfers: stepping into shower is difficult.  Equipment: Grab bars, shower bench, bedside commode  IADLs: Shopping: Does not currently do that/has not tried Light housekeeping: Does not currently do that/has not tried Meal Prep: Has not cooked in months, did not prior to CVA, sister provides meals. Community mobility: Relies on family, and friends Medication management: Assistance from sister to initiate medication management, she gives herself a shot for diabetes Financial management: Sister is assisting with monthly bill management Handwriting: Cursive form is 10% legible; Printed form is 90% legible  MOBILITY STATUS: Needs Assist: CGA and Hx of falls  POSTURE COMMENTS:  No Significant postural limitations Sitting balance: Good  ACTIVITY TOLERANCE: Activity tolerance: Good  FUNCTIONAL OUTCOME MEASURES: MAM-20: TBD  UPPER EXTREMITY ROM:    Active ROM Right eval Left Eval [facility]  Shoulder flexion 50(58)   Shoulder abduction 43(48)   Shoulder adduction    Shoulder extension    Shoulder internal rotation    Shoulder external rotation    Elbow flexion 118(131)   Elbow extension -24(-22)   Wrist flexion 20 (21)   Wrist extension 30(31)   Wrist ulnar deviation    Wrist radial deviation    Wrist pronation    Wrist supination    (Blank rows = not tested)  UPPER EXTREMITY MMT:     MMT Right eval Left eval  Shoulder flexion 2/5 WFL  Shoulder abduction 2/5 [facility]  Shoulder adduction    Shoulder extension    Shoulder internal rotation    Shoulder external rotation     Middle trapezius    Lower trapezius    Elbow flexion 4-/5 WFL  Elbow extension 4-/5 WFL  Wrist flexion 2+/5 WFL  Wrist extension 3-/5 WFL  Wrist ulnar deviation    Wrist radial deviation    Wrist pronation    Wrist supination    (Blank rows = not tested)  HAND FUNCTION: Grip strength: Right: 21 lbs; Left: 14 lbs, Lateral pinch: Right: 7 lbs, Left: 4 lbs, and 3 point pinch: Right: 4 lbs, Left: 5 lbs  COORDINATION: 9 Hole Peg test: Right: 72 sec; Left: 40 sec  SENSATION: Light touch: Impaired  Proprioception: Impaired   EDEMA: none  MUSCLE TONE: RUE: Mild  COGNITION: Overall cognitive status: Impaired  VISION: Subjective report: Pt. Has not noticed the changes in vision but her sister has. Caregiver reports that Pt. Does not bring much attention to the R side.  Baseline vision:  Visual history:   VISION ASSESSMENT: TBD   PERCEPTION: TBD  PRAXIS: Impaired: Motor planning  TREATMENT DATE: 02/07/2024   Self Care:  -Discussed falls safety after pt reported fall yesterday while carrying dishes to kitchen. Option to order walker tray table to improve transporting items, pt demonstrates good insight stating she will not carry items when home alone.  -trialed use of grippy mat to prevent items from sliding across table, reports using R hand as gross assist with dining ware when eating at home.   Therapeutic Activity:  -Completed 48 piece butterly puzzle with MAX assist to structure and sequence task. When asked to locate 4 corner pieces pt correctly identified 3, however unable to place corner pieces in correct orientation. Pt accurately identified correct placement of 2/15 pieces placed into partially completed puzzle, unable to identify errors despite cueing.  -Utilized geometric shapes to copy design pattern onto design matt.   Neuromuscular  Re-education: -Pt performed [facility] tasks using the Grooved pegboard. Demonstrated improved translatory skills moving from palm to fingertips, difficulty manipulating in fingertips to alter angle for placement. Question vision vs cognitive deficits limiting manipulation of grooves; fair frustration tolerance. Focused on storing 10 grooved pegs in hand prior to returning to container, no items dropped. -Facilitated [facility] skills using the Jamar Tweezer Dexterity Task using R hand to pick up 1 thin sticks from a horizontal position, and sustaining the grasp while preparing to place them into a vertical position with the pegboard placed at a flat tabletop surface.    PATIENT EDUCATION: Education details: Grip/Pinch strength, POC, ROM and BUE strength, BUE motor control  Person educated: Patient and Sister Education method: Explanation, Demonstration, Tactile cues, and Verbal cues Education comprehension: verbalized understanding and returned demonstration  HOME EXERCISE PROGRAM: Yellow theraputty HEP   GOALS: Goals reviewed with patient? Yes  SHORT TERM GOALS: Target date: 03/10/2024    Pt. Will be independent utilizing HEPs for hand strengthening exercises.  Baseline: Eval: No current HEP program Goal status: INITIAL   LONG TERM GOALS: Target date: 04/21/2024   Pt. Will improve BUE grip strength by 5# of force to be able to securely hold items. Baseline: Eval: R grip strength: 21#, L grip strength: 14# Goal status: INITIAL  2.  Pt. Will improve RUE [facility] skills by 3 sec. to be able to manipulate zippers/buttons. Baseline: 9 hole peg test; R side: 72 secs, L side: 40 secs Goal status: INITIAL  3.  Pt. Will increase R shoulder flex/shoulder ABD ROM by 10 degrees to perform ADLs/IADLs.  Baseline: R shoulder flex: 50(58), R shoulder ABD: 43(48) Goal status: INITIAL  4.  Pt. Will improve handwriting to 50% legible in cursive form, legibility to be able to send written correspondence Baseline:  10% legible in cursive form, 90% legible in printed form. Goal status: INITIAL  5.  Pt. Will improve BUE pinch strength by 2# of force to  be able to assist with hiking pants Baseline: R Lateral Pinch: 7, R 3 pt. Pinch: 4, L Lateral Pinch: 4, L 3 pt. Pinch: 5 Goal status: INITIAL  6.  Pt. Will increase R wrist extension/flexion by 5 degrees in anticipation of reaching hold of items for ADLs. Baseline: R Wrist ext: 20(21), R Wrist flex: 30(31) Goal status: INITIAL  ASSESSMENT:  CLINICAL IMPRESSION: Pt reports fall yesterday with no residual pain/deficits, educated on falls safety and adapted equipment recs (walker tray). Continues to demonstrate difficulty with pattern following for puzzle and geometric pattern. MAX cues to utilize color matching or presence of edge/corner to correctly identify puzzle piece placement. Good palm storage of grooved pegs  and thin pegs, increased time and encouragement to manipulate grooved pegs in fingertips for proper alignment. Pt. Will benefit from OT services to work on RUE ROM, RUE strengthening,  bilateral grip strength,  bilateral pinch strength, bilateral FMC skills, and R side awareness and attention in order to improve functional independence of ADL/IADL tasks at home.   PERFORMANCE DEFICITS: in functional skills including ADLs, IADLs, coordination, dexterity, proprioception, sensation, tone, ROM, strength, pain, Fine motor control, Gross motor control, endurance, and UE functional use, and psychosocial skills including environmental adaptation, habits, interpersonal interactions, and routines and behaviors.   IMPAIRMENTS: are limiting patient from ADLs, IADLs, rest and sleep, work, leisure, and social participation.   CO-MORBIDITIES: may have co-morbidities  that affects occupational performance. Patient will benefit from skilled OT to address above impairments and improve overall function.  MODIFICATION OR ASSISTANCE TO COMPLETE EVALUATION: Min-Moderate  modification of tasks or assist with assess necessary to complete an evaluation.  OT OCCUPATIONAL PROFILE AND HISTORY: Detailed assessment: Review of records and additional review of physical, cognitive, psychosocial history related to current functional performance.  CLINICAL DECISION MAKING: Moderate - several treatment options, min-mod task modification necessary  REHAB POTENTIAL: Good  EVALUATION COMPLEXITY: Moderate    PLAN:  OT FREQUENCY: 2x/week  OT DURATION: 12 weeks  PLANNED INTERVENTIONS: 97535 self care/ADL training, 02889 therapeutic exercise, 97530 therapeutic activity, 97112 neuromuscular re-education, 97018 paraffin, 02989 moist heat, 97010 cryotherapy, 97034 contrast bath, 97032 electrical stimulation (manual), passive range of motion, visual/perceptual remediation/compensation, energy conservation, patient/family education, and DME and/or AE instructions  RECOMMENDED OTHER SERVICES: ST & PT  CONSULTED AND AGREED WITH PLAN OF CARE: Patient and family member/caregiver  PLAN FOR NEXT SESSION: treatment  Elston Slot, M.S. OTR/L  02/07/24, 3:03 PM  ascom [phone]  02/07/2024, 3:03 PM

## 2024-02-07 NOTE — Therapy (Signed)
 OUTPATIENT PHYSICAL THERAPY TREATMENT   Patient Name: Haley Jones MRN: [id number] DOB:03/05/1956, 68 y.o., female Today's Date: 02/07/2024   PCP: Dr. Oneil Pinal REFERRING PROVIDER: Toribio Pitch, PA-C  END OF SESSION:   PT End of Session - 02/07/24 1446     Visit Number 4    Number of Visits 24    Date for PT Re-Evaluation 04/21/24    Authorization Type [employer]    Progress Note Due on Visit 10    PT Start Time 1446    PT Stop Time 1530    PT Time Calculation (min) 44 min    Equipment Utilized During Treatment Gait belt    Activity Tolerance Patient tolerated treatment well;No increased pain    Behavior During Therapy [facility] for tasks assessed/performed            Past Medical History:  Diagnosis Date   Adnexal mass    Anxiety    Chronic back pain    Depression    Diabetes mellitus without complication (HCC)    Endometriosis 03/12/2018   GERD (gastroesophageal reflux disease)    Heart murmur 02/2018   undetected until she was an adult. 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   Weight loss 12/06/2017   Past Surgical History:  Procedure Laterality Date   ABDOMINAL HYSTERECTOMY  2008   ovaries also   CESAREAN SECTION  1994   COLONOSCOPY     COLONOSCOPY WITH PROPOFOL  N/A 02/12/2018   Procedure: COLONOSCOPY WITH PROPOFOL ;  Surgeon: Toledo, Ladell POUR, MD;  Location: [facility] END[facility];  Service: Gastroenterology;  Laterality: N/A;   ESOPHAGOGASTRODUODENOSCOPY (EGD) WITH PROPOFOL  N/A 02/12/2018   Procedure: ESOPHAGOGASTRODUODENOSCOPY (EGD) WITH PROPOFOL ;  Surgeon: Toledo, Ladell POUR, MD;  Location: [facility] ENDOSCOPY;  Service: Gastroenterology;  Laterality: N/A;   EYE SURGERY Left 1973   muscle shortening to straighten cross eyes   LAPAROSCOPY N/A 02/20/2018   Procedure: LAPAROSCOPY DIAGNOSTIC, ( RESECTION OF RIGHT LOWER QUADRANT ABDOMINAL MASS);  Surgeon: Nicholaus Selinda Birmingham,  MD;  Location: [facility] ORS;  Service: General;  Laterality: N/A;   ROTATOR CUFF REPAIR Left 2011   Patient Active Problem List   Diagnosis Date Noted   Depression with anxiety 01/04/2024   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   Acute CVA (cerebrovascular accident) (HCC) 12/20/2023   Primary hypertension 12/20/2023   Acute kidney injury superimposed on chronic kidney disease (HCC) 12/20/2023   DM type 2 with diabetic mixed hyperlipidemia (HCC) 11/17/2022   Major depressive disorder, recurrent, 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/17/2022   B12 deficiency 02/23/2021   SBO (small bowel obstruction) (HCC) 01/29/2019   Barrett's esophagus without dysplasia 04/15/2018   Weight loss 12/06/2017   Hoarse 12/06/2017   Lumbar disc disease 07/26/2017   Diabetes mellitus type 2, controlled, without complications (HCC) 02/09/2015   Surgical menopause 11/26/2014    ONSET DATE: 12/20/2023  REFERRING DIAG: P36.487 (ICD-10-CM) - Left middle cerebral artery stroke (HCC)   THERAPY DIAG:  Other lack of coordination  Hemiplegia and hemiparesis following cerebral infarction affecting right dominant side (HCC)  Muscle weakness (generalized)  Abnormality of gait  Unsteadiness on feet  Rationale for Evaluation and Treatment: Rehabilitation  SUBJECTIVE:  SUBJECTIVE STATEMENT:  Pt states yesterday she was walking to the kitchen using RW, but carrying a container to clean while washing her dishes and then she suddenly fell backwards and hit her head. States she immediately called her sister to come check on her. States she didn't fall that hard even though it didn't feel good, so her sister came to help her get up off the floor. States she did not have LOC. Pt states her sister is looking to order her a tray to put on the RW to  allow her to carry items (pt currently has small walker bag). Denies pain currently. Pt states her sister still comes to be with her in mornings/evenings during ADLs and for all 3 meals, but is no longer staying all day with patient.   PERTINENT HISTORY:Pt states she is not sure the exact date of when her CVA happened because she kept falling at home trying to care for herself and her daughter, but she finally went to the hospital when her sister insisted on it. Pt states her special needs daughter, Haley Jones, passed away recently while pt was in the hospital at [facility] before she was transferred to [facility] for stroke rehabilitation.Pt's sister states pt requires supervision/cuing for ADLs  to don clothes correctly, with correct orientation (potential apraxia?) Pt/sister report pt has R side inattention.Pt reports supposedly one of her legs is shorter than the other, believes her R LE might be the shorter one. 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     PAIN:  Are you having pain? No   PRECAUTIONS: Fall  RED FLAGS: None   WEIGHT BEARING RESTRICTIONS: No  FALLS: Has patient fallen in last 6 months? Yes. Number of falls 6+ before going to hospital and 1 fall since being home from CIR - pt states she was trying to gather her clothes by herself rather than waiting for her sister to get there to help her  LIVING ENVIRONMENT Lives with: lives alone and but pt's sister, Haley Jones, comes over daily to assist with ADLs and stays to provide supervision all day - pt states at night she would be able to walk to bathroom using RW if needed (but she wears depends in event of incontinence) Lives in: House/apartment Stairs: she has steps, but she doesn't have to use them (house was wheelchair accessible for her daughter)  Has following equipment at home: Environmental consultant - 2 wheeled, shower chair, Grab bars, Ramped entry, and transport chair  PLOF:  Independent, Independent with household mobility without device, Independent with homemaking with ambulation, Independent with gait, Independent with transfers, and she was the primary caregiver for her recently deceased daughter   CLOF: Sister provides supervision daily for pt to ambulate using RW safely and provides assist for ADLs (showering and dressing - progressing towards supervision with these activities); Sister states pt requires cues to turn and step back fully prior to sitting to ensure her safety  PATIENT GOALS: get back to being able to go shopping with improved community level ambulation without the walker; return to driving   OBJECTIVE:  Note: Objective measures were completed at Evaluation unless otherwise noted.  DIAGNOSTIC FINDINGS:   EXAM: MRI HEAD WITHOUT CONTRAST   TECHNIQUE: Multiplanar, multiecho pulse sequences of the brain and surrounding structures were obtained without intravenous contrast.   COMPARISON:  Brain MRI 07/31/2022.  IMPRESSION: 1. Patchy acute cortical/subcortical infarcts within the left parietal and occipital lobes individually  measuring up to 3 cm (MCA vascular territory). Subtle petechial hemorrhage associated with some of these infarcts. No frank hemorrhagic conversion. 2. Additional punctate left MCA territory cortical acute infarct within the left insula. 3. Background parenchymal atrophy and chronic small vessel ischemic disease with chronic lacunar infarcts, as described. 4. Paranasal sinus disease as outlined (including severe right maxillary sinusitis).   Electronically Signed   By: Rockey Childs D.O.   On: 12/20/2023 11:52  COGNITION: Overall cognitive status: Within functional limits for tasks assessed   SENSATION: Light touch: initially misses the first 1-2 touches on R LE, but then able to feel remaining touches Proprioception: Impaired grossly  COORDINATION: Symmetrical heel-to-shin bilaterally  EDEMA:  Not formally  assessed, but none observed  MUSCLE TONE: Not formally assessed  POSTURE: rounded shoulders, forward head, and posterior pelvic tilt  LOWER EXTREMITY MMT:    MMT Right Eval Left Eval  Hip flexion 3+, no pain 3+ with some L hip and back pain with this  Hip extension    Hip abduction    Hip adduction    Hip internal rotation    Hip external rotation    Knee flexion 3+ 4  Knee extension 4- 4  Ankle dorsiflexion 3+ 4  Ankle plantarflexion 4- 4  Ankle inversion    Ankle eversion    (Blank rows = not tested)  BED MOBILITY:  Findings: Sit to supine need to assess Supine to sit need to assess *pt reports some difficulty with bed mobility  TRANSFERS: Sit to stand: SBA  Assistive device utilized: Environmental consultant - 2 wheeled     Stand to sit: SBA  Assistive device utilized: Environmental consultant - 2 wheeled     Chair to chair: SBA  Assistive device utilized: Environmental consultant - 2 wheeled       GAIT: Findings: Gait Characteristics: slight R ankle instability noted, step through pattern, decreased stance time- Right, decreased stride length, and decreased ankle dorsiflexion- Right, Distance walked: ~163ft, Assistive device utilized:Walker - 2 wheeled, Level of assistance: SBA and CGA, and Comments:    FUNCTIONAL TESTS:  5 times sit to stand: 38.75 seconds, using UE support 10 meter walk test: 0.48 m/s using youth RW, requires CGA for safety 6 minute walk test: Visit 2, see note Berg Balance Scale: Visit 2, see note Functional gait assessment: need to assess, when appropriate  PATIENT SURVEYS:  ABC scale 7.5%                                                                                                                             TREATMENT DATE: 02/07/2024  Unless otherwise stated, CGA was provided and gait belt donned in order to ensure pt safety throughout session.  L stand pivot transport chair>green chair using RW with CGA/SBA.   Gait training 146ft using RW with CGA/SBA assist for safety. Pt demonstrating  the following gait deviations with therapist providing the described cuing and facilitation for improvement:  Recpirocal stepping pattern  Slow, but adequate gait speed No overt R LE impaired gait mechanics  Transitioned to gait training ~90ft + 157ft (seated break) using R HHA providing light min A for balance  Continues with above gait mechanics; however, at slower gait speed and more guarded upper body posturing Has a few minor LOBs when turning, but able to recover with use of ankle and hip strategies without increased assist  *Continues to demo minor R intention throughout session requiring increased cuing to locate items on that side  Progressed to gait training a total of 15ft using R HHA with ~75ft of head turns to identify targets on walls and then ~83ft of gait training without UE support, but no head turns Pt has to stop and turn head to visually scan rather than truly being able to walk while turning head to locate targets Requires light min A throughout   Dynamic standing balance intervention using 4 Blaze Pods set on random (2 in front of pt and 1 on either side of patient) set on 2 colors with 0 sec pause between lights - requires mod cuing to recall which hand to tap to which color (pink = R, blue = L) CGA/light min A for steadying 1st set: 30 hits, 2nd set: 43 hits Pt reports feeling nervous when doing this, but was able to rotate trunk/hips and reach across body to tap target with opposite hand  Dynamic gait training forward/backwards ~30ft each direction x3 reps using R HHA transitioned to no UE support for final rep with light min A for steadying/safety  Cuing for reciprocal stepping pattern backwards with pt taking very small step lengths Continues to have slow movements with guarded posture due to fear of falling      PATIENT EDUCATION: Education details: PT POC, findings on assessment today, recommendation to continue using RW for all functional mobility Person  educated: Patient and Caregiver Haley Jones Education method: Explanation Education comprehension: verbalized understanding and needs further education  HOME EXERCISE PROGRAM: Access Code: [email or web address] URL: [email or web address] Date: 02/04/2024 Prepared by: Peggye Linear  Exercises - Standing March with Counter Support  - 2 x daily - 7 x weekly - 2 sets - 20 reps - Side stepping with counter support: yellow band loop  - 2 x daily - 7 x weekly - 2 sets - 20 reps - Sit to Stand with Counter Support  - 2 x daily - 7 x weekly - 2 sets - 12 reps   GOALS: Goals reviewed with patient? Yes  SHORT TERM GOALS: Target date: 03/10/2024  Pt will be independent with HEP in order to improve strength and balance in order to decrease fall risk and improve function at home and work.  Baseline: need to initiate Goal status: INITIAL  LONG TERM GOALS: Target date: 04/21/2024  1.  Patient will complete five times sit to stand test in < 15 seconds indicating an increased LE strength and improved balance. Baseline:  38.75 seconds, using UE support  Goal status: INITIAL  2.  Patient will increase ABC scale score >80% to demonstrate better functional mobility and better confidence with ADLs.   Baseline: 7.5% Goal status: INITIAL   3.  Patient will increase Berg Balance score by > 6 points to demonstrate decreased fall risk during functional activities. Baseline: need to assess; 01/30/2024= 34/56 Goal status: INITIAL   4. Patient will increase 10 meter walk test to >1.25m/s as to improve gait speed for better community ambulation and to reduce fall risk. Baseline: 0.48 m/s using  youth RW, requires CGA for safety  Goal status: INITIAL  5. Patient will increase six minute walk test distance to >1000 for progression to community ambulator and improve gait ability Baseline: need to assess; 01/30/2024=410 feet using RW. Goal status: INITIAL   ASSESSMENT:  CLINICAL IMPRESSION:  Therapy session focused  on progression of gait training with decreased reliance on BUE support for dynamic challenge to retrain balance systems. Patient demonstrates slow, guarded gait pattern when not using RW support resulting in limited ability to turn head to visually scan environment and causing minor LOBs when turning. Patient also participated in dynamic standing balance tasks of cross body reaching to external targets with pt reporting fear of falling, but no overt LOB. Patient will continue to benefit from progression of dynamic reaching and dynamic stepping balance interventions as well as dynamic gait training without UE support.  Ms. Puleo will benefit from further skilled PT to improve these deficits in order to increase QOL, decrease fall risk, and ease/safety with ADLs.   OBJECTIVE IMPAIRMENTS: Abnormal gait, decreased activity tolerance, decreased balance, decreased endurance, decreased knowledge of use of DME, decreased mobility, difficulty walking, decreased strength, decreased safety awareness, impaired vision/preception, and pain.   ACTIVITY LIMITATIONS: carrying, lifting, bending, standing, squatting, stairs, transfers, bed mobility, bathing, toileting, dressing, reach over head, hygiene/grooming, and locomotion level  PARTICIPATION LIMITATIONS: meal prep, cleaning, laundry, driving, shopping, and community activity  PERSONAL FACTORS: Age, Time since onset of injury/illness/exacerbation, and 3+ comorbidities: Hypertension, B12 deficiency, type 2 diabetes, hyperlipidemia, CKD stage IIIa are also affecting patient's functional outcome.   REHAB POTENTIAL: Good  CLINICAL DECISION MAKING: Evolving/moderate complexity  EVALUATION COMPLEXITY: Moderate  PLAN:  PT FREQUENCY: 1-2x/week  PT DURATION: 12 weeks  PLANNED INTERVENTIONS: 97164- PT Re-evaluation, 97750- Physical Performance Testing, 97110-Therapeutic exercises, 97530- Therapeutic activity, [id number]- Neuromuscular re-education, 97535- Self Care,  97140- Manual therapy, [id number]- Gait training, [id number]- Orthotic Initial, [phone]- Orthotic/Prosthetic subsequent, [phone]- Canalith repositioning, [id number]- Electrical stimulation (manual), Patient/Family education, Balance training, Stair training, Taping, Joint mobilization, Spinal mobilization, Vestibular training, Visual/preceptual remediation/compensation, DME instructions, Cryotherapy, Moist heat, and Biofeedback  PLAN FOR NEXT SESSION:  - FGA when appropriate and add LTG - review/update HEP - continue HIIT - continue gait training without AD for balance and righting (include multi plane, multidirectional)  - dynamic reaching and dynamic stepping balance    Leora Platt, PT, DPT, NCS, CSRS Physical Therapist - [place]  [facility]  3:32 PM 02/07/24

## 2024-02-11 ENCOUNTER — Ambulatory Visit: Admitting: Speech Pathology

## 2024-02-11 ENCOUNTER — Telehealth: Payer: Self-pay

## 2024-02-11 ENCOUNTER — Ambulatory Visit

## 2024-02-11 ENCOUNTER — Ambulatory Visit: Admitting: Occupational Therapy

## 2024-02-11 DIAGNOSIS — R41841 Cognitive communication deficit: Secondary | ICD-10-CM

## 2024-02-11 DIAGNOSIS — R278 Other lack of coordination: Secondary | ICD-10-CM | POA: Diagnosis not present

## 2024-02-11 DIAGNOSIS — M6281 Muscle weakness (generalized): Secondary | ICD-10-CM

## 2024-02-11 DIAGNOSIS — R269 Unspecified abnormalities of gait and mobility: Secondary | ICD-10-CM

## 2024-02-11 DIAGNOSIS — R4701 Aphasia: Secondary | ICD-10-CM

## 2024-02-11 DIAGNOSIS — R32 Unspecified urinary incontinence: Secondary | ICD-10-CM | POA: Insufficient documentation

## 2024-02-11 DIAGNOSIS — I69351 Hemiplegia and hemiparesis following cerebral infarction affecting right dominant side: Secondary | ICD-10-CM

## 2024-02-11 DIAGNOSIS — R2681 Unsteadiness on feet: Secondary | ICD-10-CM

## 2024-02-11 MED ORDER — MIRABEGRON ER 25 MG PO TB24: 25.0000 mg | ORAL_TABLET | Freq: Every day | ORAL | 2 refills | Status: AC

## 2024-02-11 NOTE — Telephone Encounter (Signed)
 Her current script is for 25 mg tabs, not 50; prescribed 3 months

## 2024-02-11 NOTE — Therapy (Addendum)
 OUTPATIENT OCCUPATIONAL THERAPY NEURO TREATMENT NOTE  Patient Name: Haley Jones MRN: [id number] DOB:Aug 30, 1955, 68 y.o., female Today's Date: 02/11/2024  PCP: Cleotilde Oneil FALCON, MD REFERRING PROVIDER: Pegge Toribio PARAS, PA-C   OT End of Session - 02/11/24 1734     Visit Number 5    Number of Visits 24    Date for OT Re-Evaluation 04/22/24    OT Start Time 1615    OT Stop Time 1700    OT Time Calculation (min) 45 min    Activity Tolerance Patient tolerated treatment well    Behavior During Therapy [facility] for tasks assessed/performed                  Past Medical History:  Diagnosis Date   Adnexal mass    Anxiety    Chronic back pain    Depression    Diabetes mellitus without complication (HCC)    Endometriosis 03/12/2018   GERD (gastroesophageal reflux disease)    Heart murmur 02/2018   undetected until she was an adult. 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   Weight loss 12/06/2017   Past Surgical History:  Procedure Laterality Date   ABDOMINAL HYSTERECTOMY  2008   ovaries also   CESAREAN SECTION  1994   COLONOSCOPY     COLONOSCOPY WITH PROPOFOL  N/A 02/12/2018   Procedure: COLONOSCOPY WITH PROPOFOL ;  Surgeon: Toledo, Ladell POUR, MD;  Location: [facility];  Service: Gastroenterology;  Laterality: N/A;   ESOPHAGOGASTRODUODENOSCOPY (EGD) WITH PROPOFOL  N/A 02/12/2018   Procedure: ESOPHAGOGASTRODUODENOSCOPY (EGD) WITH PROPOFOL ;  Surgeon: Toledo, Ladell POUR, MD;  Location: [facility];  Service: Gastroenterology;  Laterality: N/A;   EYE SURGERY Left 1973   muscle shortening to straighten cross eyes   LAPAROSCOPY N/A 02/20/2018   Procedure: LAPAROSCOPY DIAGNOSTIC, ( RESECTION OF RIGHT LOWER QUADRANT ABDOMINAL MASS);  Surgeon: Nicholaus Selinda Birmingham, MD;  Location: [facility] ORS;  Service: General;  Laterality: N/A;   ROTATOR CUFF REPAIR Left 2011   Patient Active Problem List    Diagnosis Date Noted   Depression with anxiety 01/04/2024   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   Acute CVA (cerebrovascular accident) (HCC) 12/20/2023   Primary hypertension 12/20/2023   Acute kidney injury superimposed on chronic kidney disease (HCC) 12/20/2023   DM type 2 with diabetic mixed hyperlipidemia (HCC) 11/17/2022   Major depressive disorder, recurrent, 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  Rationale for Evaluation and Treatment: Rehabilitation  SUBJECTIVE:   SUBJECTIVE STATEMENT: Pt reports feeling increased tightness at thenar eminence, hindering performance in treatment session.  Pt accompanied by: self and family member  PERTINENT HISTORY: Pt. Is a 68 yo female with hx of a L MCA CVA. Pt. Was admitted into ED on 12/20/2023 with an onset of RUE weakness, numbness dysmetria, and visual deficits. Upon assessment, it was concluded that Pt. Experienced multiple infarcts in the L Parietal/Occipital region. PMHx: Dizziness, Falls  PRECAUTIONS: None  WEIGHT BEARING RESTRICTIONS: No  PAIN:  Are you having pain? No  FALLS:  Has patient fallen in last 6 months? Yes. Number of falls 10  LIVING ENVIRONMENT: Lives with: lives with their family and lives alone Lives in: House/apartment Stairs: No, Ramp Has following equipment at home: Walker - 2 wheeled, Shower bench, and bed side commode  PLOF: Independent  PATIENT GOALS: To be able to feed herself and hold a fork and knife better.   OBJECTIVE:  Note: Objective measures were completed at Evaluation unless otherwise  noted.  HAND DOMINANCE: Left  ADLs: Overall ADLs:  Transfers/ambulation related to ADLs:  Eating: Independent  Grooming: independent UB Dressing: Pt. Has difficulty managing buttons LB Dressing: jean zippers are difficult to manipulate, hiking pants, and clothing negotiation are difficult. Toileting: Independent Bathing: Independent Tub Shower transfers: stepping into shower is difficult.  Equipment: Grab bars, shower bench, bedside commode  IADLs: Shopping: Does not currently do that/has not tried Light housekeeping: Does not currently do that/has not tried Meal Prep: Has not cooked in months, did not prior to CVA, sister provides meals. Community mobility: Relies on family, and friends Medication management: Assistance from sister to initiate medication management, she gives herself a shot for diabetes Financial management: Sister is assisting with monthly bill management Handwriting: Cursive form is 10% legible; Printed form is 90% legible  MOBILITY STATUS: Needs Assist: CGA and Hx of falls  POSTURE COMMENTS:  No Significant postural limitations Sitting balance: Good  ACTIVITY TOLERANCE: Activity tolerance: Good  FUNCTIONAL OUTCOME MEASURES: MAM-20: TBD  UPPER EXTREMITY ROM:    Active ROM Right eval Left Eval [facility]  Shoulder flexion 50(58)   Shoulder abduction 43(48)   Shoulder adduction    Shoulder extension    Shoulder internal rotation    Shoulder external rotation    Elbow flexion 118(131)   Elbow extension -24(-22)   Wrist flexion 20 (21)   Wrist extension 30(31)   Wrist ulnar deviation    Wrist radial deviation    Wrist pronation    Wrist supination    (Blank rows = not tested)  UPPER EXTREMITY MMT:     MMT Right eval Left eval  Shoulder flexion 2/5 WFL  Shoulder abduction 2/5 [facility]  Shoulder adduction    Shoulder extension    Shoulder internal rotation    Shoulder external rotation    Middle trapezius    Lower trapezius    Elbow flexion  4-/5 WFL  Elbow extension 4-/5 WFL  Wrist flexion 2+/5 WFL  Wrist extension 3-/5 WFL  Wrist ulnar deviation    Wrist radial deviation    Wrist pronation    Wrist supination    (Blank rows = not tested)  HAND FUNCTION: Grip strength: Right: 21 lbs; Left: 14 lbs, Lateral pinch: Right: 7 lbs, Left: 4 lbs, and 3 point pinch: Right: 4 lbs, Left: 5 lbs  COORDINATION: 9 Hole Peg test: Right: 72 sec; Left: 40 sec  SENSATION: Light touch: Impaired  Proprioception: Impaired   EDEMA: none  MUSCLE TONE: RUE: Mild  COGNITION: Overall cognitive status: Impaired  VISION: Subjective report: Pt. Has not noticed the changes in vision but her sister has. Caregiver reports that Pt. Does not bring much attention to the R side.  Baseline vision:  Visual history:   VISION ASSESSMENT: TBD   PERCEPTION: TBD  PRAXIS: Impaired: Motor planning  TREATMENT DATE: 02/11/2024   Therapeutic Activity:  -Pt. Performed BUE translatory movements, moving jumbo pegs from the palm to the thumb, 2nd and 3rd digit, in preparation to placing jumbo pegs into jumbo pegboard.  -Pt. Performed BUE Progressive gross grip strengthening with 11.2# of grip strength resistive force to remove jumbo pegs from jumbo pegboard, to place jumbo pegs into container in multiple planes.  20 Neuromuscular Re-education: -Facilitated translatory movements, moving 1 Grooved pegs from the palm to the thumb, 2nd, and 3rd digits in preparation of placing grooved pegs into grooved pegboard. -Facilitated FMC tasks using the Grooved pegboard, grasping and 1 grooved pegs from a horizontal position in the shallow dish, and transitioning them to a horizontal position in preparation for placing them upright into the pegboard.   Manual Therapy:  -Pt. tolerated soft tissue massage to the right hand at the volar surface to  decrease tightness in the thenar eminence. Manual therapy was performed independent of ROM, and therapeutic ex.   -Pt. tolerated soft tissue massage to the volar, and dorsal aspect of the hand the hand for ROM, there. Ex, and engagement in functional hand use.   PATIENT EDUCATION: Education details: Grip/Pinch strength, POC, ROM and BUE strength, BUE motor control  Person educated: Patient and Sister Education method: Explanation, Demonstration, Tactile cues, and Verbal cues Education comprehension: verbalized understanding and returned demonstration  HOME EXERCISE PROGRAM: Yellow theraputty HEP   GOALS: Goals reviewed with patient? Yes  SHORT TERM GOALS: Target date: 03/10/2024    Pt. Will be independent utilizing HEPs for hand strengthening exercises.  Baseline: Eval: No current HEP program Goal status: INITIAL   LONG TERM GOALS: Target date: 04/21/2024   Pt. Will improve BUE grip strength by 5# of force to be able to securely hold items. Baseline: Eval: R grip strength: 21#, L grip strength: 14# Goal status: INITIAL  2.  Pt. Will improve RUE [facility] skills by 3 sec. to be able to manipulate zippers/buttons. Baseline: 9 hole peg test; R side: 72 secs, L side: 40 secs Goal status: INITIAL  3.  Pt. Will increase R shoulder flex/shoulder ABD ROM by 10 degrees to perform ADLs/IADLs.  Baseline: R shoulder flex: 50(58), R shoulder ABD: 43(48) Goal status: INITIAL  4.  Pt. Will improve handwriting to 50% legible in cursive form, legibility to be able to send written correspondence Baseline: 10% legible in cursive form, 90% legible in printed form. Goal status: INITIAL  5.  Pt. Will improve BUE pinch strength by 2# of force to  be able to assist with hiking pants Baseline: R Lateral Pinch: 7, R 3 pt. Pinch: 4, L Lateral Pinch: 4, L 3 pt. Pinch: 5 Goal status: INITIAL  6.  Pt. Will increase R wrist extension/flexion by 5 degrees in anticipation of reaching hold of items for  ADLs. Baseline: R Wrist ext: 20(21), R Wrist flex: 30(31) Goal status: INITIAL  ASSESSMENT:  CLINICAL IMPRESSION:  Pt. Reports having a fall last week. Pt. presents with BUE weakness and increased tightness  at the thenar eminence of the R hand, hindering performance during treatment session.  Pt. Presented with difficulty sustaining grip during elbow extension, while removing jumbo pegs from pegboard. Pt. Tolerated soft tissue massage and metacarpal spread stretches to decrease tightness in the R hand. Pt. Continues have difficulty manipulating 1 Grooved Pegs, to place into grooved pegboard, requiring increased time to complete the task. Pt. Required STM following to  hand function tasks today. 2/2 tightness, and fatigue. Pt. Will benefit  from OT services to work on RUE ROM, RUE strengthening, bilateral grip strength,  bilateral pinch strength, bilateral [facility] skills, and R side awareness and attention in order to improve functional independence of ADL/IADL tasks at home.   PERFORMANCE DEFICITS: in functional skills including ADLs, IADLs, coordination, dexterity, proprioception, sensation, tone, ROM, strength, pain, Fine motor control, Gross motor control, endurance, and UE functional use, and psychosocial skills including environmental adaptation, habits, interpersonal interactions, and routines and behaviors.   IMPAIRMENTS: are limiting patient from ADLs, IADLs, rest and sleep, work, leisure, and social participation.   CO-MORBIDITIES: may have co-morbidities  that affects occupational performance. Patient will benefit from skilled OT to address above impairments and improve overall function.  MODIFICATION OR ASSISTANCE TO COMPLETE EVALUATION: Min-Moderate modification of tasks or assist with assess necessary to complete an evaluation.  OT OCCUPATIONAL PROFILE AND HISTORY: Detailed assessment: Review of records and additional review of physical, cognitive, psychosocial history related to current  functional performance.  CLINICAL DECISION MAKING: Moderate - several treatment options, min-mod task modification necessary  REHAB POTENTIAL: Good  EVALUATION COMPLEXITY: Moderate    PLAN:  OT FREQUENCY: 2x/week  OT DURATION: 12 weeks  PLANNED INTERVENTIONS: 97535 self care/ADL training, 02889 therapeutic exercise, 97530 therapeutic activity, 97112 neuromuscular re-education, 97018 paraffin, 02989 moist heat, 97010 cryotherapy, 97034 contrast bath, 97032 electrical stimulation (manual), passive range of motion, visual/perceptual remediation/compensation, energy conservation, patient/family education, and DME and/or AE instructions  RECOMMENDED OTHER SERVICES: ST & PT  CONSULTED AND AGREED WITH PLAN OF CARE: Patient and family member/caregiver  PLAN FOR NEXT SESSION: treatment  Richardson Otter, MS, OTR/L   02/11/2024, 5:37 PM  02/11/24

## 2024-02-11 NOTE — Telephone Encounter (Signed)
 A new refill request for Myrbetriq  ER 50 MG. Take one tablet by mouth every day.    If granted please send to [facility] in [place] on [address].

## 2024-02-11 NOTE — Therapy (Signed)
 OUTPATIENT SPEECH LANGUAGE PATHOLOGY  COGNITIVE-COMMUNICATION TREATMENT   Patient Name: Haley Jones MRN: [id number] DOB:11/16/1955, 68 y.o., female Today's Date: 02/11/2024  PCP: Cleotilde Oneil FALCON, MD  REFERRING PROVIDER: Pegge Toribio PARAS, PA-C    End of Session - 02/11/24 1513     Visit Number 4    Number of Visits 25    Date for SLP Re-Evaluation 04/27/24    Authorization Type [employer]    Authorization Time Period 01/28/2024 thru 04/27/2024    Authorization - Visit Number 4    Authorization - Number of Visits 24    Progress Note Due on Visit 10    SLP Start Time 1445    SLP Stop Time  1530    SLP Time Calculation (min) 45 min    Activity Tolerance Patient tolerated treatment well          Past Medical History:  Diagnosis Date   Adnexal mass    Anxiety    Chronic back pain    Depression    Diabetes mellitus without complication (HCC)    Endometriosis 03/12/2018   GERD (gastroesophageal reflux disease)    Heart murmur 02/2018   undetected until she was an adult. 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   Weight loss 12/06/2017   Past Surgical History:  Procedure Laterality Date   ABDOMINAL HYSTERECTOMY  2008   ovaries also   CESAREAN SECTION  1994   COLONOSCOPY     COLONOSCOPY WITH PROPOFOL  N/A 02/12/2018   Procedure: COLONOSCOPY WITH PROPOFOL ;  Surgeon: Toledo, Ladell POUR, MD;  Location: [facility] END[facility];  Service: Gastroenterology;  Laterality: N/A;   ESOPHAGOGASTRODUODENOSCOPY (EGD) WITH PROPOFOL  N/A 02/12/2018   Procedure: ESOPHAGOGASTRODUODENOSCOPY (EGD) WITH PROPOFOL ;  Surgeon: Toledo, Ladell POUR, MD;  Location: [facility];  Service: Gastroenterology;  Laterality: N/A;   EYE SURGERY Left 1973   muscle shortening to straighten cross eyes   LAPAROSCOPY N/A 02/20/2018   Procedure: LAPAROSCOPY DIAGNOSTIC, ( RESECTION OF RIGHT LOWER QUADRANT ABDOMINAL MASS);   Surgeon: Nicholaus Selinda Birmingham, MD;  Location: [facility] ORS;  Service: General;  Laterality: N/A;   ROTATOR CUFF REPAIR Left 2011   Patient Active Problem List   Diagnosis Date Noted   Urinary incontinence as sequela of cerebrovascular accident (CVA) 02/11/2024   Depression with anxiety 01/04/2024   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   Acute CVA (cerebrovascular accident) (HCC) 12/20/2023   Primary hypertension 12/20/2023   Acute kidney injury superimposed on chronic kidney disease (HCC) 12/20/2023   DM type 2 with diabetic mixed hyperlipidemia (HCC) 11/17/2022   Major depressive disorder, recurrent, 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/17/2022   B12 deficiency 02/23/2021   SBO (small bowel obstruction) (HCC) 01/29/2019   Barrett's esophagus without dysplasia 04/15/2018   Weight loss 12/06/2017   Hoarse 12/06/2017   Lumbar disc disease 07/26/2017   Diabetes mellitus type 2, controlled, without complications (HCC) 02/09/2015   Surgical menopause 11/26/2014    ONSET DATE: 12/20/23   REFERRING DIAG: CVA  THERAPY DIAG:  Cognitive communication deficit  Aphasia  Rationale for Evaluation and Treatment Rehabilitation  SUBJECTIVE:   SUBJECTIVE STATEMENT: Pt alert, cooperative. Talkative, ?hyperverbal. Pt accompanied by: self  PERTINENT HISTORY: Haley Jones is a 68 year old left-handed female who presented to [facility]23 with right side weakness/falls and dizziness. MRI of the brain showed patchy acute cortical/subcortical infarcts within the left parietal occipital lobes individually measuring up to  3 cm (MCA vascular territory). Subtle petechial hemorrhage associated with some of these infarcts.  No frank hemorrhagic conversion.  Additional punctate left MCA territory cortical acute infarct within the left insula.  Background parenchymal atrophy and chronic small vessel ischemic disease with chronic lacunar infarcts.  CT angio showed severe stenosis/nonocclusive thrombus of the left MCA  bifurcation affecting the inferior division.  Nonocclusive embolus also visible within the left parietal branch as well. Echocardiogram ejection fraction of 60 to 65% no wall motion abnormalities grade 1 diastolic dysfunction. Past medical history also noted for hypertension, B12 deficiency, type 2 diabetes mellitus, hyperlipidemia, CKD stage III, depression.  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 D/c home after CIR admission 12/28/23-01/18/24. Sister lives nearby and checks on patient throughout the day, assists with meals, bathing, and medications.  DIAGNOSTIC FINDINGS: see above  PAIN:  Are you having pain? No   FALLS: Has patient fallen in last 6 months?  Yes, Number of falls: 1 since returning home from rehab, multiple prior to hospitalization  LIVING ENVIRONMENT: Lives with: lives alone Lives in: House/apartment  PLOF:  Level of assistance: Independent with IADLs Employment: Retired   PATIENT GOALS   Pt states she wants to drive again  OBJECTIVE:    TODAY'S TREATMENT: Skilled treatment session focused on pt's cognitive communication goals. SLP facilitated session by providing the following interventions:  Motivational interviewing provided - pt would like to increase independence with medication management, money management, pt's voice is at baseline, she doesn't like cooking and despite questions, it was difficult to differentiate baseline vs acute deficits as pt didn't manage her medications or blood sugars or cook prior to CVA. Pt's verbal communication was moderately tangential with shifts between topics  With supervision to Min A pt was able to provide 8-10 items within basic categories   PATIENT EDUCATION: Education details: as above Person educated: Patient Education method: Verbal cues Education comprehension: verbalized understanding and needs further education   HOME EXERCISE PROGRAM:    To be developed during subsequent therapy sessions   GOALS:  Goals reviewed with patient? Yes  SHORT TERM GOALS: Target date: 10 sessions  The patient will sustain attention in simple cognitive linguistic task for 5 minutes given min verbal cues.  Baseline: Goal status: INITIAL  2.  Patient will reduce impulsivity by preplanning steps before completing a task with moderate verbal cues. Baseline:  Goal status: INITIAL  3.  Patient will complete Patient Reported Outcome Measure to assess self-perception of deficits.  Baseline:  Goal status: INITIAL   LONG TERM GOALS: Target date: 04/27/2024  Patient will alternate attention between two moderately complex tasks 80% acc, with min verbal cues.  Baseline:  Goal status: INITIAL  2.  Patient will demonstrate emergent awareness by identifying 75% of errors with min cues for double checking. Baseline:  Goal status: INITIAL  3.  Patient will recall safety precautions/mobility recommendations using visual aids if necessary to reduce risk for falls. Baseline:  Goal status: INITIAL   ASSESSMENT:  CLINICAL IMPRESSION: Based on recent assessment, Haley Jones presents with overall moderate cognitive communication impairment per standardized testing using the Cognitive Linguistic Quick Test. Primary deficit areas (severe) include attention, executive function, and visuospatial skills. Patient's impulsivity contributed significantly to testing errors, as did poor sustained attention to testing instructions. Impulsivity impacting safety at home, contributing to fall at home. Patient and sister report reduced frustration tolerance. She is often frustrated when being  given instructions. Patient demonstrated some emergent awareness of errors on tasks such as mazes, but was unaware of perseverations in design generation and generative naming, and errors in clock drawing, symbol cancellation, and symbol trails tasks. See details of today's  treatment session above. Recommend skilled ST to target cognitive communication impairments to increase safety and independence.   OBJECTIVE IMPAIRMENTS include attention, memory, awareness, and executive functioning. These impairments are limiting patient from managing medications, managing appointments, managing finances, household responsibilities, ADLs/IADLs, and effectively communicating at home and in community. Factors affecting potential to achieve goals and functional outcome are ability to learn/carryover information and severity of impairments. Patient will benefit from skilled SLP services to address above impairments and improve overall function.  REHAB POTENTIAL: Good  PLAN: SLP FREQUENCY: 1-2x/week  SLP DURATION: 12 weeks  PLANNED INTERVENTIONS: Environmental controls, Cueing hierachy, Cognitive reorganization, Internal/external aids, Functional tasks, SLP instruction and feedback, Compensatory strategies, Patient/family education, and 07492 Treatment of speech (30 or 45 min)    Herve Haug B. Rubbie, M.S., CCC-SLP, Tree surgeon Certified Brain Injury Specialist [facility]  [facility] Rehabilitation Services Office [phone] Ascom [phone] Fax [phone]

## 2024-02-11 NOTE — Therapy (Signed)
 OUTPATIENT PHYSICAL THERAPY TREATMENT   Patient Name: Haley Jones MRN: [id number] DOB:[birth year], 68 y.o., female Today's Date: 02/11/2024   PCP: Dr. Oneil Pinal REFERRING PROVIDER: Toribio Pitch, PA-C  END OF SESSION:   PT End of Session - 02/11/24 1539     Visit Number 5    Number of Visits 24    Date for PT Re-Evaluation 04/21/24    Authorization Type [employer]    Progress Note Due on Visit 10    PT Start Time 1534    PT Stop Time 1614    PT Time Calculation (min) 40 min    Equipment Utilized During Treatment Gait belt    Activity Tolerance Patient tolerated treatment well;No increased pain    Behavior During Therapy [facility] for tasks assessed/performed            Past Medical History:  Diagnosis Date   Adnexal mass    Anxiety    Chronic back pain    Depression    Diabetes mellitus without complication (HCC)    Endometriosis 03/12/2018   GERD (gastroesophageal reflux disease)    Heart murmur 02/2018   undetected until she was an adult. 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   Weight loss 12/06/2017   Past Surgical History:  Procedure Laterality Date   ABDOMINAL HYSTERECTOMY  2008   ovaries also   CESAREAN SECTION  1994   COLONOSCOPY     COLONOSCOPY WITH PROPOFOL  N/A 02/12/2018   Procedure: COLONOSCOPY WITH PROPOFOL ;  Surgeon: Toledo, Ladell POUR, MD;  Location: [facility];  Service: Gastroenterology;  Laterality: N/A;   ESOPHAGOGASTRODUODENOSCOPY (EGD) WITH PROPOFOL  N/A 02/12/2018   Procedure: ESOPHAGOGASTRODUODENOSCOPY (EGD) WITH PROPOFOL ;  Surgeon: Toledo, Ladell POUR, MD;  Location: [facility];  Service: Gastroenterology;  Laterality: N/A;   EYE SURGERY Left 1973   muscle shortening to straighten cross eyes   LAPAROSCOPY N/A 02/20/2018   Procedure: LAPAROSCOPY DIAGNOSTIC, ( RESECTION OF RIGHT LOWER QUADRANT ABDOMINAL MASS);  Surgeon: Nicholaus Selinda Birmingham,  MD;  Location: [facility] OR[facility];  Service: General;  Laterality: N/A;   ROTATOR CUFF REPAIR Left 2011   Patient Active Problem List   Diagnosis Date Noted   Urinary incontinence as sequela of cerebrovascular accident (CVA) 02/11/2024   Depression with anxiety 01/04/2024   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   Acute CVA (cerebrovascular accident) (HCC) 12/20/2023   Primary hypertension 12/20/2023   Acute kidney injury superimposed on chronic kidney disease (HCC) 12/20/2023   DM type 2 with diabetic mixed hyperlipidemia (HCC) 11/17/2022   Major depressive disorder, recurrent, 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/17/2022   B12 deficiency 02/23/2021   SBO (small bowel obstruction) (HCC) 01/29/2019   Barrett's esophagus without dysplasia 04/15/2018   Weight loss 12/06/2017   Hoarse 12/06/2017   Lumbar disc disease 07/26/2017   Diabetes mellitus type 2, controlled, without complications (HCC) 02/09/2015   Surgical menopause 11/26/2014    ONSET DATE: 12/20/2023  REFERRING DIAG: P36.487 (ICD-10-CM) - Left middle cerebral artery stroke (HCC)   THERAPY DIAG:  Other lack of coordination  Hemiplegia and hemiparesis following cerebral infarction affecting right dominant side (HCC)  Muscle weakness (generalized)  Abnormality of gait  Unsteadiness on feet  Rationale for Evaluation and Treatment: Rehabilitation  SUBJECTIVE:  SUBJECTIVE STATEMENT: Denies any medical updates. No pain or problems sustained since falling last week. Pt reports to be doing well today.   PERTINENT HISTORY:Pt states she is not sure the exact date of when her CVA happened because she kept falling at home trying to care for herself and her daughter, but she finally went to the hospital when her sister insisted on it. Pt states her special needs  daughter, Haley Jones, passed away recently while pt was in the hospital at [facility] before she was transferred to [facility] for stroke rehabilitation.Pt's sister states pt requires supervision/cuing for ADLs  to don clothes correctly, with correct orientation (potential apraxia?) Pt/sister report pt has R side inattention.Pt reports supposedly one of her legs is shorter than the other, believes her R LE might be the shorter one. 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     PAIN:  Are you having pain? No   PRECAUTIONS: Fall  RED FLAGS: None   WEIGHT BEARING RESTRICTIONS: No  FALLS: Has patient fallen in last 6 months? Yes. Number of falls 6+ before going to hospital and 1 fall since being home from CIR - pt states she was trying to gather her clothes by herself rather than waiting for her sister to get there to help her  LIVING ENVIRONMENT Lives with: lives alone and but pt's sister, Haley Jones, comes over daily to assist with ADLs and stays to provide supervision all day - pt states at night she would be able to walk to bathroom using RW if needed (but she wears depends in event of incontinence) Lives in: House/apartment Stairs: she has steps, but she doesn't have to use them (house was wheelchair accessible for her daughter)  Has following equipment at home: Environmental consultant - 2 wheeled, shower chair, Grab bars, Ramped entry, and transport chair  PLOF: Independent, Independent with household mobility without device, Independent with homemaking with ambulation, Independent with gait, Independent with transfers, and she was the primary caregiver for her recently deceased daughter   CLOF: Sister provides supervision daily for pt to ambulate using RW safely and provides assist for ADLs (showering and dressing - progressing towards supervision with these activities); Sister states pt requires cues to turn and step back fully prior to sitting to ensure her  safety  PATIENT GOALS: get back to being able to go shopping with improved community level ambulation without the walker; return to driving   OBJECTIVE:  Note: Objective measures were completed at Evaluation unless otherwise noted.  DIAGNOSTIC FINDINGS:   EXAM: MRI HEAD WITHOUT CONTRAST   TECHNIQUE: Multiplanar, multiecho pulse sequences of the brain and surrounding structures were obtained without intravenous contrast.   COMPARISON:  Brain MRI 07/31/2022.  IMPRESSION: 1. Patchy acute cortical/subcortical infarcts within the left parietal and occipital lobes individually measuring up to 3 cm (MCA vascular territory). Subtle petechial hemorrhage associated with some of these infarcts. No frank hemorrhagic conversion. 2. Additional punctate left MCA territory cortical acute infarct within the left insula. 3. Background parenchymal atrophy and chronic small vessel ischemic disease with chronic lacunar infarcts, as described. 4. Paranasal sinus disease as outlined (including severe right maxillary sinusitis).   Electronically Signed   By: Rockey Childs D.O.   On: 12/20/2023 11:52  COGNITION: Overall cognitive status: Within functional limits for tasks assessed   SENSATION: Light touch: initially misses the first 1-2 touches on R LE, but then able to feel remaining touches Proprioception: Impaired grossly  COORDINATION: Symmetrical heel-to-shin bilaterally  EDEMA:  Not formally assessed, but none observed  MUSCLE TONE: Not formally assessed  POSTURE: rounded shoulders, forward head, and posterior pelvic tilt  LOWER EXTREMITY MMT:    MMT Right Eval Left Eval  Hip flexion 3+, no pain 3+ with some L hip and back pain with this  Hip extension    Hip abduction    Hip adduction    Hip internal rotation    Hip external rotation    Knee flexion 3+ 4  Knee extension 4- 4  Ankle dorsiflexion 3+ 4  Ankle plantarflexion 4- 4  Ankle inversion    Ankle eversion     (Blank rows = not tested)  BED MOBILITY:  Findings: Sit to supine need to assess Supine to sit need to assess *pt reports some difficulty with bed mobility  TRANSFERS: Sit to stand: SBA  Assistive device utilized: Environmental consultant - 2 wheeled     Stand to sit: SBA  Assistive device utilized: Environmental consultant - 2 wheeled     Chair to chair: SBA  Assistive device utilized: Environmental consultant - 2 wheeled       GAIT: Findings: Gait Characteristics: slight R ankle instability noted, step through pattern, decreased stance time- Right, decreased stride length, and decreased ankle dorsiflexion- Right, Distance walked: ~167ft, Assistive device utilized:Walker - 2 wheeled, Level of assistance: SBA and CGA, and Comments:    FUNCTIONAL TESTS:  5 times sit to stand: 38.75 seconds, using UE support 10 meter walk test: 0.48 m/s using youth RW, requires CGA for safety 6 minute walk test: Visit 2, see note Berg Balance Scale: Visit 2, see note Functional gait assessment: need to assess, when appropriate  PATIENT SURVEYS:  ABC scale 7.5%                                                                                                                             TREATMENT DATE: 02/11/2024  -10xSTS from chair, feet on 4 step (2 sets minGuardA)  Seated recovery  -345ft AMB overground, minGuardA, no device 53m10s (nervous, but no LOB)  Seated recovery  -137ft AMB overground with 58ft sled push (60lb) in middle  Seated recovery  -112ft AMB overground with 41ft sled push (60lb) in middle, carrying 1lb dumbbell in each hand (when not sled pushing), pt turns sled around in hallway Seated recovery  -17ft AMB overground with 49ft sled push (60lb) in middle, carrying 1lb dumbbell in each hand (when not sled pushing), pt turns sled around in hallway, step over blue falls mat along the way Seated recovery  -179ft AMB overground with 19ft sled push (60lb) in middle, carrying 1lb dumbbell in each hand (when not sled pushing), pt turns sled  around in hallway, step over blue falls mat along the way, 1.5lb AW bilat  Seated recovery  -152ft AMB overground with 68ft sled push (60lb) in middle, carrying 1lb dumbbell in each hand (when not sled pushing), pt turns sled around  in hallway, step over blue falls mat along the way, 1.5lb AW bilat   -Alternating forward and backward AMB in // bars c 2 hand support: 1.5lb AW bilat, over airex pad to folded falls mat, to bosu sunny side up to aerobic step to floor and return in reverse  3x total, minguard assist with gait belt, cues to visually scan for stepping accuracy      PATIENT EDUCATION: Education details: PT POC, findings on assessment today, recommendation to continue using RW for all functional mobility Person educated: Patient and Caregiver Haley Jones Education method: Explanation Education comprehension: verbalized understanding and needs further education  HOME EXERCISE PROGRAM: Access Code: [email or web address] URL: [email or web address] Date: 02/04/2024 Prepared by: Peggye Linear  Exercises - Standing March with Counter Support  - 2 x daily - 7 x weekly - 2 sets - 20 reps - Side stepping with counter support: yellow band loop  - 2 x daily - 7 x weekly - 2 sets - 20 reps - Sit to Stand with Counter Support  - 2 x daily - 7 x weekly - 2 sets - 12 reps   GOALS: Goals reviewed with patient? Yes  SHORT TERM GOALS: Target date: 03/10/2024  Pt will be independent with HEP in order to improve strength and balance in order to decrease fall risk and improve function at home and work.  Baseline: need to initiate Goal status: INITIAL  LONG TERM GOALS: Target date: 04/21/2024  1.  Patient will complete five times sit to stand test in < 15 seconds indicating an increased LE strength and improved balance. Baseline:  38.75 seconds, using UE support  Goal status: INITIAL  2.  Patient will increase ABC scale score >80% to demonstrate better functional mobility and better confidence with  ADLs.   Baseline: 7.5% Goal status: INITIAL   3.  Patient will increase Berg Balance score by > 6 points to demonstrate decreased fall risk during functional activities. Baseline: need to assess; 01/30/2024= 34/56 Goal status: INITIAL   4. Patient will increase 10 meter walk test to >1.4m/s as to improve gait speed for better community ambulation and to reduce fall risk. Baseline: 0.48 m/s using youth RW, requires CGA for safety  Goal status: INITIAL  5. Patient will increase six minute walk test distance to >1000 for progression to community ambulator and improve gait ability Baseline: need to assess; 01/30/2024=410 feet using RW. Goal status: INITIAL   ASSESSMENT:  CLINICAL IMPRESSION:  Continued with gait-based balance and motor control training with an increasing difficult circuit, alternating directions, and with a high intensity effort planted directly in the middle as to maximize as many functional elements as possible. Confidence in AMB unsupported remains quite low. LOB righting is heavily dominated by use of UE on fixed objects rather than ankle/hip or step righting reactions. Ms. M[place]by will benefit from further skilled PT to improve these deficits in order to increase QOL, decrease fall risk, and ease/safety with ADLs.   OBJECTIVE IMPAIRMENTS: Abnormal gait, decreased activity tolerance, decreased balance, decreased endurance, decreased knowledge of use of DME, decreased mobility, difficulty walking, decreased strength, decreased safety awareness, impaired vision/preception, and pain.   ACTIVITY LIMITATIONS: carrying, lifting, bending, standing, squatting, stairs, transfers, bed mobility, bathing, toileting, dressing, reach over head, hygiene/grooming, and locomotion level  PARTICIPATION LIMITATIONS: meal prep, cleaning, laundry, driving, shopping, and community activity  PERSONAL FACTORS: Age, Time since onset of injury/illness/exacerbation, and 3+ comorbidities: Hypertension,  B12 deficiency, type 2 diabetes, hyperlipidemia, CKD stage IIIa are also affecting patient's  functional outcome.   REHAB POTENTIAL: Good  CLINICAL DECISION MAKING: Evolving/moderate complexity  EVALUATION COMPLEXITY: Moderate  PLAN:  PT FREQUENCY: 1-2x/week  PT DURATION: 12 weeks  PLANNED INTERVENTIONS: 97164- PT Re-evaluation, 97750- Physical Performance Testing, 97110-Therapeutic exercises, 97530- Therapeutic activity, 97112- Neuromuscular re-education, 97535- Self Care, [id number]- Manual therapy, [phone]- Gait training, [phone]- Orthotic Initial, [phone]- Orthotic/Prosthetic subsequent, [phone]- Canalith repositioning, [phone]- Electrical stimulation (manual), Patient/Family education, Balance training, Stair training, Taping, Joint mobilization, Spinal mobilization, Vestibular training, Visual/preceptual remediation/compensation, DME instructions, Cryotherapy, Moist heat, and Biofeedback  PLAN FOR NEXT SESSION:  - FGA when appropriate and add LTG - floor transfers training - continue HIIT - continue gait training without AD for balance and righting (include multi plane, multidirectional)  - dynamic reaching and dynamic stepping balance    3:42 PM, 02/11/24 Peggye JAYSON Linear, PT, DPT Physical Therapist - [facility]  Outpatient Physical Therapy- Main Campus [phone]

## 2024-02-13 ENCOUNTER — Ambulatory Visit: Admitting: Occupational Therapy

## 2024-02-13 ENCOUNTER — Ambulatory Visit

## 2024-02-13 ENCOUNTER — Ambulatory Visit: Attending: Physician Assistant | Admitting: Speech Pathology

## 2024-02-13 DIAGNOSIS — R269 Unspecified abnormalities of gait and mobility: Secondary | ICD-10-CM

## 2024-02-13 DIAGNOSIS — M6281 Muscle weakness (generalized): Secondary | ICD-10-CM | POA: Diagnosis present

## 2024-02-13 DIAGNOSIS — R41841 Cognitive communication deficit: Secondary | ICD-10-CM | POA: Insufficient documentation

## 2024-02-13 DIAGNOSIS — I69351 Hemiplegia and hemiparesis following cerebral infarction affecting right dominant side: Secondary | ICD-10-CM

## 2024-02-13 DIAGNOSIS — R278 Other lack of coordination: Secondary | ICD-10-CM

## 2024-02-13 DIAGNOSIS — R2681 Unsteadiness on feet: Secondary | ICD-10-CM | POA: Insufficient documentation

## 2024-02-13 NOTE — Therapy (Signed)
 OUTPATIENT SPEECH LANGUAGE PATHOLOGY  COGNITIVE-COMMUNICATION TREATMENT   Patient Name: Haley Jones MRN: [id number] DOB:[birth year], 68 y.o., female Today's Date: 02/13/2024  PCP: Cleotilde Oneil FALCON, MD  REFERRING PROVIDER: Pegge Toribio PARAS, PA-C    End of Session - 02/13/24 1323     Visit Number 5    Number of Visits 25    Date for SLP Re-Evaluation 04/27/24    Authorization Type [employer]    Authorization Time Period 01/28/2024 thru 04/27/2024    Authorization - Visit Number 5    Authorization - Number of Visits 24    Progress Note Due on Visit 10    SLP Start Time 1315    SLP Stop Time  1400    SLP Time Calculation (min) 45 min    Activity Tolerance Patient tolerated treatment well          Past Medical History:  Diagnosis Date   Adnexal mass    Anxiety    Chronic back pain    Depression    Diabetes mellitus without complication (HCC)    Endometriosis 03/12/2018   GERD (gastroesophageal reflux disease)    Heart murmur 02/2018   undetected until she was an adult. 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   Weight loss 12/06/2017   Past Surgical History:  Procedure Laterality Date   ABDOMINAL HYSTERECTOMY  2008   ovaries also   CESAREAN SECTION  1994   COLONOSCOPY     COLONOSCOPY WITH PROPOFOL  N/A 02/12/2018   Procedure: COLONOSCOPY WITH PROPOFOL ;  Surgeon: Toledo, Ladell POUR, MD;  Location: [facility];  Service: Gastroenterology;  Laterality: N/A;   ESOPHAGOGASTRODUODENOSCOPY (EGD) WITH PROPOFOL  N/A 02/12/2018   Procedure: ESOPHAGOGASTRODUODENOSCOPY (EGD) WITH PROPOFOL ;  Surgeon: Toledo, Ladell POUR, MD;  Location: [facility];  Service: Gastroenterology;  Laterality: N/A;   EYE SURGERY Left 1973   muscle shortening to straighten cross eyes   LAPAROSCOPY N/A 02/20/2018   Procedure: LAPAROSCOPY DIAGNOSTIC, ( RESECTION OF RIGHT LOWER QUADRANT ABDOMINAL MASS);   Surgeon: Nicholaus Selinda Birmingham, MD;  Location: [facility];  Service: General;  Laterality: N/A;   ROTATOR CUFF REPAIR Left 2011   Patient Active Problem List   Diagnosis Date Noted   Urinary incontinence as sequela of cerebrovascular accident (CVA) 02/11/2024   Depression with anxiety 01/04/2024   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   Acute CVA (cerebrovascular accident) (HCC) 12/20/2023   Primary hypertension 12/20/2023   Acute kidney injury superimposed on chronic kidney disease (HCC) 12/20/2023   DM type 2 with diabetic mixed hyperlipidemia (HCC) 11/17/2022   Major depressive disorder, recurrent, 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/17/2022   B12 deficiency 02/23/2021   SBO (small bowel obstruction) (HCC) 01/29/2019   Barrett's esophagus without dysplasia 04/15/2018   Weight loss 12/06/2017   Hoarse 12/06/2017   Lumbar disc disease 07/26/2017   Diabetes mellitus type 2, controlled, without complications (HCC) 02/09/2015   Surgical menopause 11/26/2014    ONSET DATE: 12/20/23   REFERRING DIAG: CVA  THERAPY DIAG:  Cognitive communication deficit  Rationale for Evaluation and Treatment Rehabilitation  SUBJECTIVE:   PERTINENT HISTORY: Haley Jones is a 68 year old left-handed female who presented to [facility] on 12/20/23 with right side weakness/falls and dizziness. MRI of the brain showed patchy acute cortical/subcortical infarcts within the left parietal occipital lobes individually measuring up to 3 cm (MCA vascular territory). Subtle petechial hemorrhage associated with some of these infarcts.  No frank hemorrhagic conversion.  Additional punctate left MCA territory cortical acute infarct within the left insula.  Background parenchymal atrophy and chronic small vessel ischemic disease with chronic lacunar infarcts.  CT angio showed severe stenosis/nonocclusive thrombus of the left MCA bifurcation affecting the inferior division.  Nonocclusive embolus also visible within the left parietal branch as  well. Echocardiogram ejection fraction of 60 to 65% no wall motion abnormalities grade 1 diastolic dysfunction. Past medical history also noted for hypertension, B12 deficiency, type 2 diabetes mellitus, hyperlipidemia, CKD stage III, depression.  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 D/c home after CIR admission 12/28/23-01/18/24. Sister lives nearby and checks on patient throughout the day, assists with meals, bathing, and medications.  DIAGNOSTIC FINDINGS: see above  PAIN:  Are you having pain? No   FALLS: Has patient fallen in last 6 months?  Yes, Number of falls: 1 since returning home from rehab, multiple prior to hospitalization  LIVING ENVIRONMENT: Lives with: lives alone Lives in: House/apartment  PLOF:  Level of assistance: Independent with IADLs Employment: Retired   PATIENT GOALS   Pt states she wants to drive again  SUBJECTIVE STATEMENT: Pt alert, cooperative. Talkative, ?hyperverbal. Pt accompanied by: self   OBJECTIVE:   TODAY'S TREATMENT: Skilled treatment session focused on pt's cognitive communication goals. SLP facilitated session by providing the following interventions:  Pt states that her sister organizes a pill box and then also handsppt her medicines   Pt completed semi-complex medication management task during today's session with moderate assistance to achieve 90% accuracy with specific cues for problem solving and awareness of errors ( cues to double check herself)  When sequencing 4 steps within activity, pt able to place cards in the correct order as well as verbally described each step without any word finding difficulty  Pt required maximal multimodal assistance to complete small tangram. When asked if she has any puzzles or word searches at home pt commented that she does but doesn't do them because I am picky about the topics that I like when doing a word search, what  she (pt's sister) gets is (pt yawning) boring, I will mess with it for a little bit then I frustrated with it not because it is hard but because it isn't something that I choose because she bought it for me. I should just tell her that I like the chunky word searches I guess I should It just frustrates that I wasn't able to get a choice in what I got, even if she took a picture of it before she bought it to show it to me.   PATIENT EDUCATION: Education details: as above Person educated: Patient Education method: Verbal cues Education comprehension: verbalized understanding and needs further education   HOME EXERCISE PROGRAM:   To be developed during subsequent therapy sessions   GOALS:  Goals reviewed with patient? Yes  SHORT TERM GOALS: Target date: 10 sessions  The patient will sustain attention in simple cognitive linguistic task for 5 minutes given min verbal cues.  Baseline: Goal status: INITIAL  2.  Patient will reduce impulsivity by preplanning steps before completing a task with moderate verbal cues. Baseline:  Goal status: INITIAL  3.  Patient will complete Patient Reported Outcome Measure to assess self-perception of deficits.  Baseline:  Goal status: INITIAL   LONG TERM GOALS: Target date: 04/27/2024  Patient will alternate attention between two moderately complex tasks 80% acc, with  min verbal cues.  Baseline:  Goal status: INITIAL  2.  Patient will demonstrate emergent awareness by identifying 75% of errors with min cues for double checking. Baseline:  Goal status: INITIAL  3.  Patient will recall safety precautions/mobility recommendations using visual aids if necessary to reduce risk for falls. Baseline:  Goal status: INITIAL   ASSESSMENT:  CLINICAL IMPRESSION: Based on recent assessment, Harleen Fineberg presents with overall moderate cognitive communication impairment per standardized testing using the Cognitive Linguistic Quick Test. Primary deficit  areas (severe) include attention, executive function, and visuospatial skills. Patient's impulsivity contributed significantly to testing errors, as did poor sustained attention to testing instructions. Impulsivity impacting safety at home, contributing to fall at home. Patient and sister report reduced frustration tolerance. She is often frustrated when being given instructions. Patient demonstrated some emergent awareness of errors on tasks such as mazes, but was unaware of perseverations in design generation and generative naming, and errors in clock drawing, symbol cancellation, and symbol trails tasks. See details of today's treatment session above. Recommend skilled ST to target cognitive communication impairments to increase safety and independence.   Prognosis guarded d/t pt's self described pickiness.  OBJECTIVE IMPAIRMENTS include attention, memory, awareness, and executive functioning. These impairments are limiting patient from managing medications, managing appointments, managing finances, household responsibilities, ADLs/IADLs, and effectively communicating at home and in community. Factors affecting potential to achieve goals and functional outcome are ability to learn/carryover information and severity of impairments. Patient will benefit from skilled SLP services to address above impairments and improve overall function.  REHAB POTENTIAL: Good  PLAN: SLP FREQUENCY: 1-2x/week  SLP DURATION: 12 weeks  PLANNED INTERVENTIONS: Environmental controls, Cueing hierachy, Cognitive reorganization, Internal/external aids, Functional tasks, SLP instruction and feedback, Compensatory strategies, Patient/family education, and [id number] Treatment of speech (30 or 45 min)    Shayanna Thatch B. Rubbie, M.S., CCC-SLP, Tree surgeon Certified Brain Injury Specialist [facility]  [facility] Rehabilitation Services Office [phone] Ascom [phone] Fax  [phone]

## 2024-02-13 NOTE — Therapy (Signed)
 OUTPATIENT PHYSICAL THERAPY TREATMENT   Patient Name: Haley Jones MRN: [id number] DOB:[birth year], 68 y.o., female Today's Date: 02/13/2024  PCP: Dr. Oneil Pinal REFERRING PROVIDER: Toribio Pitch, PA-C  END OF SESSION:   PT End of Session - 02/13/24 1400     Visit Number 6    Number of Visits 24    Date for PT Re-Evaluation 04/21/24    Authorization Type [employer]    Progress Note Due on Visit 10    PT Start Time 1400    PT Stop Time 1440    PT Time Calculation (min) 40 min    Equipment Utilized During Treatment Gait belt    Activity Tolerance Patient tolerated treatment well;No increased pain    Behavior During Therapy [facility] for tasks assessed/performed          Past Medical History:  Diagnosis Date   Adnexal mass    Anxiety    Chronic back pain    Depression    Diabetes mellitus without complication (HCC)    Endometriosis 03/12/2018   GERD (gastroesophageal reflux disease)    Heart murmur 02/2018   undetected until she was an adult. 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   Weight loss 12/06/2017   Past Surgical History:  Procedure Laterality Date   ABDOMINAL HYSTERECTOMY  2008   ovaries also   CESAREAN SECTION  1994   COLONOSCOPY     COLONOSCOPY WITH PROPOFOL  N/A 02/12/2018   Procedure: COLONOSCOPY WITH PROPOFOL ;  Surgeon: Toledo, Ladell POUR, MD;  Location: [facility];  Service: Gastroenterology;  Laterality: N/A;   ESOPHAGOGASTRODUODENOSCOPY (EGD) WITH PROPOFOL  N/A 02/12/2018   Procedure: ESOPHAGOGASTRODUODENOSCOPY (EGD) WITH PROPOFOL ;  Surgeon: Toledo, Ladell POUR, MD;  Location: [facility];  Service: Gastroenterology;  Laterality: N/A;   EYE SURGERY Left 1973   muscle shortening to straighten cross eyes   LAPAROSCOPY N/A 02/20/2018   Procedure: LAPAROSCOPY DIAGNOSTIC, ( RESECTION OF RIGHT LOWER QUADRANT ABDOMINAL MASS);  Surgeon: Nicholaus Selinda Birmingham, MD;   Location: [facility] ORS;  Service: General;  Laterality: N/A;   ROTATOR CUFF REPAIR Left 2011   Patient Active Problem List   Diagnosis Date Noted   Urinary incontinence as sequela of cerebrovascular accident (CVA) 02/11/2024   Depression with anxiety 01/04/2024   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   Acute CVA (cerebrovascular accident) (HCC) 12/20/2023   Primary hypertension 12/20/2023   Acute kidney injury superimposed on chronic kidney disease (HCC) 12/20/2023   DM type 2 with diabetic mixed hyperlipidemia (HCC) 11/17/2022   Major depressive disorder, recurrent, 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/17/2022   B12 deficiency 02/23/2021   SBO (small bowel obstruction) (HCC) 01/29/2019   Barrett's esophagus without dysplasia 04/15/2018   Weight loss 12/06/2017   Hoarse 12/06/2017   Lumbar disc disease 07/26/2017   Diabetes mellitus type 2, controlled, without complications (HCC) 02/09/2015   Surgical menopause 11/26/2014    ONSET DATE: 12/20/2023  REFERRING DIAG: P36.487 (ICD-10-CM) - Left middle cerebral artery stroke (HCC)   THERAPY DIAG:  Muscle weakness (generalized)  Hemiplegia and hemiparesis following cerebral infarction affecting right dominant side (HCC)  Abnormality of gait  Unsteadiness on feet  Rationale for Evaluation and Treatment: Rehabilitation  SUBJECTIVE:  SUBJECTIVE STATEMENT: Denies any medical updates. No pain or problems sustained since falling last week. Pt reports to be doing well today.  PERTINENT HISTORY:Pt states she is not sure the exact date of when her CVA happened because she kept falling at home trying to care for herself and her daughter, but she finally went to the hospital when her sister insisted on it. Pt states her special needs daughter, [name], passed away recently while  pt was in the hospital at [facility] before she was transferred to [facility] for stroke rehabilitation.Pt's sister states pt requires supervision/cuing for ADLs  to don clothes correctly, with correct orientation (potential apraxia?) Pt/sister report pt has R side inattention.Pt reports supposedly one of her legs is shorter than the other, believes her R LE might be the shorter one. 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     PAIN:  Are you having pain? No  PRECAUTIONS: Fall RED FLAGS: None  WEIGHT BEARING RESTRICTIONS: No FALLS: Has patient fallen in last 6 months? Yes. Number of falls 6+ before going to hospital and 1 fall since being home from CIR - pt states she was trying to gather her clothes by herself rather than waiting for her sister to get there to help her  LIVING ENVIRONMENT Lives with: lives alone and but pt's sister, [name], comes over daily to assist with ADLs and stays to provide supervision all day - pt states at night she would be able to walk to bathroom using RW if needed (but she wears depends in event of incontinence) Lives in: House/apartment Stairs: she has steps, but she doesn't have to use them (house was wheelchair accessible for her daughter)  Has following equipment at home: Environmental consultant - 2 wheeled, shower chair, Grab bars, Ramped entry, and transport chair  PLOF: Independent, Independent with household mobility without device, Independent with homemaking with ambulation, Independent with gait, Independent with transfers, and she was the primary caregiver for her recently deceased daughter   CLOF: Sister provides supervision daily for pt to ambulate using RW safely and provides assist for ADLs (showering and dressing - progressing towards supervision with these activities); Sister states pt requires cues to turn and step back fully prior to sitting to ensure her safety  PATIENT GOALS: get back to being able to  go shopping with improved community level ambulation without the walker; return to driving   OBJECTIVE:  Note: Objective measures were completed at Evaluation unless otherwise noted.  DIAGNOSTIC FINDINGS:   EXAM: MRI HEAD WITHOUT CONTRAST   TECHNIQUE: Multiplanar, multiecho pulse sequences of the brain and surrounding structures were obtained without intravenous contrast.   COMPARISON:  Brain MRI 07/31/2022.  IMPRESSION: 1. Patchy acute cortical/subcortical infarcts within the left parietal and occipital lobes individually measuring up to 3 cm (MCA vascular territory). Subtle petechial hemorrhage associated with some of these infarcts. No frank hemorrhagic conversion. 2. Additional punctate left MCA territory cortical acute infarct within the left insula. 3. Background parenchymal atrophy and chronic small vessel ischemic disease with chronic lacunar infarcts, as described. 4. Paranasal sinus disease as outlined (including severe right maxillary sinusitis).   Electronically Signed   By: Rockey Childs D.O.   On: 12/20/2023 11:52  COGNITION: Overall cognitive status: Within functional limits for tasks assessed   SENSATION: Light touch: initially misses the first 1-2 touches on R LE, but then able to feel remaining touches Proprioception: Impaired grossly  COORDINATION: Symmetrical heel-to-shin bilaterally  EDEMA:  Not formally assessed, but none observed  MUSCLE TONE: Not formally assessed  POSTURE: rounded shoulders, forward head, and posterior pelvic tilt  LOWER EXTREMITY MMT:    MMT Right Eval Left Eval  Hip flexion 3+, no pain 3+ with some L hip and back pain with this  Hip extension    Hip abduction    Hip adduction    Hip internal rotation    Hip external rotation    Knee flexion 3+ 4  Knee extension 4- 4  Ankle dorsiflexion 3+ 4  Ankle plantarflexion 4- 4  Ankle inversion    Ankle eversion    (Blank rows = not tested)  BED MOBILITY:  Findings:  Sit to supine need to assess Supine to sit need to assess *pt reports some difficulty with bed mobility  TRANSFERS: Sit to stand: SBA  Assistive device utilized: Environmental consultant - 2 wheeled     Stand to sit: SBA  Assistive device utilized: Environmental consultant - 2 wheeled     Chair to chair: SBA  Assistive device utilized: Environmental consultant - 2 wheeled       GAIT: Findings: Gait Characteristics: slight R ankle instability noted, step through pattern, decreased stance time- Right, decreased stride length, and decreased ankle dorsiflexion- Right, Distance walked: ~168ft, Assistive device utilized:Walker - 2 wheeled, Level of assistance: SBA and CGA, and Comments:    FUNCTIONAL TESTS:  5 times sit to stand: 38.75 seconds, using UE support 10 meter walk test: 0.48 m/s using youth RW, requires CGA for safety 6 minute walk test: Visit 2, see note Berg Balance Scale: Visit 2, see note Functional gait assessment: need to assess, when appropriate  PATIENT SURVEYS:  ABC scale 7.5%                                                                                                                             TREATMENT DATE: 02/13/2024  -overground AMB c YRW x542ft (4109ft in 94m30sec) 0.71m/s  -STS from chair, hands free x15  -AMB agility course loop partially through // bars: red mat, falls mat, 4 step up/down: minGuard x6 with SPC LUE for cane training (2lb AW bilat)  Confidence very limited, difficult to DC hand on bar support --AMB agility course loop partially through // bars: red mat, falls mat, 4 step up/down: minGuard x3 with SPC LUE for cane training  A few more LOB, total assist required: pt not visaully scanning steps for full foot coverage -AMB overground c 3KG ball 153ft, no device  -AMB overground c 3KG ball 117ft, no device (gets tired and in a hurry)  -STS from chair to RW x11 (feet on 4 step)  -STS from chair to RW x11 (feet on 4 step)     PATIENT EDUCATION: Education details: PT POC, findings on assessment  today, recommendation to continue using RW for all functional mobility Person educated: Patient and Caregiver [name] Education method: Explanation Education comprehension: verbalized understanding and needs further education  HOME EXERCISE PROGRAM: Access Code: [email or web address] URL: [email or web address] Date: 02/04/2024 Prepared by: Peggye Linear  Exercises - Standing March with Counter Support  - 2 x daily - 7 x weekly - 2 sets - 20 reps - Side stepping with counter support: yellow band loop  - 2 x daily - 7 x weekly - 2 sets - 20 reps - Sit to Stand with Counter Support  - 2 x daily - 7 x weekly - 2 sets - 12 reps   GOALS: Goals reviewed with patient? Yes  SHORT TERM GOALS: Target date: 03/10/2024  Pt will be independent with HEP in order to improve strength and balance in order to decrease fall risk and improve function at home and work.  Baseline: need to initiate Goal status: INITIAL  LONG TERM GOALS: Target date: 04/21/2024  1.  Patient will complete five times sit to stand test in < 15 seconds indicating an increased LE strength and improved balance. Baseline:  38.75 seconds, using UE support  Goal status: INITIAL  2.  Patient will increase ABC scale score >80% to demonstrate better functional mobility and better confidence with ADLs.   Baseline: 7.5% Goal status: INITIAL   3.  Patient will increase Berg Balance score by > 6 points to demonstrate decreased fall risk during functional activities. Baseline: need to assess; 01/30/2024= 34/56 Goal status: INITIAL   4. Patient will increase 10 meter walk test to >1.60m/s as to improve gait speed for better community ambulation and to reduce fall risk. Baseline: 0.48 m/s using youth RW, requires CGA for safety  Goal status: INITIAL  5. Patient will increase six minute walk test distance to >1000 for progression to community ambulator and improve gait ability Baseline: need to assess; 01/30/2024=410 feet using RW; 02/13/24:  457ft in 9m30s Goal status: MET   ASSESSMENT:  CLINICAL IMPRESSION:  Continued with gait-based balance and motor control training with an increasing difficult circuit, alternating directions. Took a little break from high intensity efforts. AMB has made big improvements thus far, but balance deficits and falls anxiety remain fairly high. LOB righting is heavily dominated by use of UE on fixed objects rather than ankle/hip or step righting reactions. Ms. Stepp will benefit from further skilled PT to improve these deficits in order to increase QOL, decrease fall risk, and ease/safety with ADLs.   OBJECTIVE IMPAIRMENTS: Abnormal gait, decreased activity tolerance, decreased balance, decreased endurance, decreased knowledge of use of DME, decreased mobility, difficulty walking, decreased strength, decreased safety awareness, impaired vision/preception, and pain.   ACTIVITY LIMITATIONS: carrying, lifting, bending, standing, squatting, stairs, transfers, bed mobility, bathing, toileting, dressing, reach over head, hygiene/grooming, and locomotion level  PARTICIPATION LIMITATIONS: meal prep, cleaning, laundry, driving, shopping, and community activity  PERSONAL FACTORS: Age, Time since onset of injury/illness/exacerbation, and 3+ comorbidities: Hypertension, B12 deficiency, type 2 diabetes, hyperlipidemia, CKD stage IIIa are also affecting patient's functional outcome.   REHAB POTENTIAL: Good  CLINICAL DECISION MAKING: Evolving/moderate complexity  EVALUATION COMPLEXITY: Moderate  PLAN:  PT FREQUENCY: 1-2x/week  PT DURATION: 12 weeks  PLANNED INTERVENTIONS: 97164- PT Re-evaluation, 97750- Physical Performance Testing, 97110-Therapeutic exercises, 97530- Therapeutic activity, [id number]- Neuromuscular re-education, 97535- Self Care, 02859- Manual therapy, [id number]- Gait training, [phone]- Orthotic Initial, [phone]- Orthotic/Prosthetic subsequent, [phone]- Canalith repositioning, [phone]- Electrical  stimulation (manual), Patient/Family education, Balance training, Stair training, Taping, Joint mobilization, Spinal mobilization, Vestibular training, Visual/preceptual remediation/compensation, DME instructions, Cryotherapy, Moist heat, and Biofeedback  PLAN FOR NEXT SESSION:  - FGA when appropriate and add LTG - floor transfers training -  continue HIIT - continue gait training without AD for balance and righting (include multi plane, multidirectional)  - dynamic reaching and dynamic stepping balance    2:01 PM, 02/13/24 Peggye JAYSON Linear, PT, DPT Physical Therapist - [place] [facility]  Outpatient Physical Therapy- Main Campus [phone]

## 2024-02-13 NOTE — Therapy (Addendum)
 OUTPATIENT OCCUPATIONAL THERAPY NEURO TREATMENT NOTE  Patient Name: Haley Jones MRN: [id number] DOB:1955/10/21, 68 y.o., female Today's Date: 02/13/2024  PCP: Cleotilde Oneil FALCON, MD REFERRING PROVIDER: Pegge Toribio PARAS, PA-C   OT End of Session - 02/13/24 1719     Visit Number 6    Number of Visits 24    Date for OT Re-Evaluation 04/22/24    OT Start Time 1445    OT Stop Time 1530    OT Time Calculation (min) 45 min    Activity Tolerance Patient tolerated treatment well    Behavior During Therapy [facility] for tasks assessed/performed                   Past Medical History:  Diagnosis Date   Adnexal mass    Anxiety    Chronic back pain    Depression    Diabetes mellitus without complication (HCC)    Endometriosis 03/12/2018   GERD (gastroesophageal reflux disease)    Heart murmur 02/2018   undetected until she was an adult. 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   Weight loss 12/06/2017   Past Surgical History:  Procedure Laterality Date   ABDOMINAL HYSTERECTOMY  2008   ovaries also   CESAREAN SECTION  1994   COLONOSCOPY     COLONOSCOPY WITH PROPOFOL  N/A 02/12/2018   Procedure: COLONOSCOPY WITH PROPOFOL ;  Surgeon: Toledo, Ladell POUR, MD;  Location: [facility];  Service: Gastroenterology;  Laterality: N/A;   ESOPHAGOGASTRODUODENOSCOPY (EGD) WITH PROPOFOL  N/A 02/12/2018   Procedure: ESOPHAGOGASTRODUODENOSCOPY (EGD) WITH PROPOFOL ;  Surgeon: Toledo, Ladell POUR, MD;  Location: [facility];  Service: Gastroenterology;  Laterality: N/A;   EYE SURGERY Left 1973   muscle shortening to straighten cross eyes   LAPAROSCOPY N/A 02/20/2018   Procedure: LAPAROSCOPY DIAGNOSTIC, ( RESECTION OF RIGHT LOWER QUADRANT ABDOMINAL MASS);  Surgeon: Nicholaus Selinda Birmingham, MD;  Location: [facility] ORS;  Service: General;  Laterality: N/A;   ROTATOR CUFF REPAIR Left 2011   Patient Active Problem List    Diagnosis Date Noted   Depression with anxiety 01/04/2024   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   Acute CVA (cerebrovascular accident) (HCC) 12/20/2023   Primary hypertension 12/20/2023   Acute kidney injury superimposed on chronic kidney disease (HCC) 12/20/2023   DM type 2 with diabetic mixed hyperlipidemia (HCC) 11/17/2022   Major depressive disorder, recurrent, 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  Rationale for Evaluation and Treatment: Rehabilitation  SUBJECTIVE:   SUBJECTIVE STATEMENT: Pt reports that her R hand feels better today, and does not feel tight. Pt accompanied by: self and family member  PERTINENT HISTORY: Pt. Is a 68 yo female with hx of a L MCA CVA. Pt. Was admitted into ED on 12/20/2023 with an onset of RUE weakness, numbness dysmetria, and visual deficits. Upon assessment, it was concluded that Pt. Experienced multiple infarcts in the L Parietal/Occipital region. PMHx: Dizziness, Falls  PRECAUTIONS: None  WEIGHT BEARING RESTRICTIONS: No  PAIN:  Are you having pain? No  FALLS: Has patient fallen in last 6 months? Yes. Number of falls 10  LIVING ENVIRONMENT: Lives with: lives with their family and lives alone Lives in: House/apartment Stairs: No, Ramp Has following equipment at home: Walker - 2 wheeled, Shower bench, and bed side commode  PLOF: Independent  PATIENT GOALS: To be able to feed herself and hold a fork and knife better.   OBJECTIVE:  Note: Objective measures were completed at Evaluation unless otherwise noted.  HAND DOMINANCE:  Left  ADLs: Overall ADLs:  Transfers/ambulation related to ADLs:  Eating: Independent  Grooming: independent UB Dressing: Pt. Has difficulty managing buttons LB Dressing: jean zippers are difficult to manipulate, hiking pants, and clothing negotiation are difficult. Toileting: Independent Bathing: Independent Tub Shower transfers: stepping into shower is difficult.  Equipment: Grab bars, shower bench, bedside commode  IADLs: Shopping: Does not currently do that/has not tried Light housekeeping: Does not currently do that/has not tried Meal Prep: Has not cooked in months, did not prior to CVA, sister provides meals. Community mobility: Relies on family, and friends Medication management: Assistance from sister to initiate medication management, she gives herself a shot for diabetes Financial management: Sister is assisting with monthly bill management Handwriting: Cursive form is 10% legible; Printed form is 90% legible  MOBILITY STATUS: Needs Assist: CGA and Hx of falls  POSTURE COMMENTS:  No Significant postural limitations Sitting balance: Good  ACTIVITY TOLERANCE: Activity tolerance: Good  FUNCTIONAL OUTCOME MEASURES: MAM-20: TBD  UPPER EXTREMITY ROM:    Active ROM Right eval Left Eval [facility]  Shoulder flexion 50(58)   Shoulder abduction 43(48)   Shoulder adduction    Shoulder extension    Shoulder internal rotation    Shoulder external rotation    Elbow flexion 118(131)   Elbow extension -24(-22)   Wrist flexion 20 (21)   Wrist extension 30(31)   Wrist ulnar deviation    Wrist radial deviation    Wrist pronation    Wrist supination    (Blank rows = not tested)  UPPER EXTREMITY MMT:     MMT Right eval Left eval  Shoulder flexion 2/5 WFL  Shoulder abduction 2/5 [facility]  Shoulder adduction    Shoulder extension    Shoulder internal rotation    Shoulder external rotation    Middle trapezius    Lower trapezius    Elbow flexion 4-/5 WFL  Elbow extension  4-/5 WFL  Wrist flexion 2+/5 WFL  Wrist extension 3-/5 WFL  Wrist ulnar deviation    Wrist radial deviation    Wrist pronation    Wrist supination    (Blank rows = not tested)  HAND FUNCTION: Grip strength: Right: 21 lbs; Left: 14 lbs, Lateral pinch: Right: 7 lbs, Left: 4 lbs, and 3 point pinch: Right: 4 lbs, Left: 5 lbs  COORDINATION: 9 Hole Peg test: Right: 72 sec; Left: 40 sec  SENSATION: Light touch: Impaired  Proprioception: Impaired   EDEMA: none  MUSCLE TONE: RUE: Mild  COGNITION: Overall cognitive status: Impaired  VISION: Subjective report: Pt. Has not noticed the changes in vision but her sister has. Caregiver reports that Pt. Does not bring much attention to the R side.  Baseline vision:  Visual history:   VISION ASSESSMENT: TBD   PERCEPTION: TBD  PRAXIS: Impaired: Motor planning  TREATMENT DATE: 02/13/2024   Therapeutic Activity:  -Facilitated sustained grasp, storing  4-5 1/2 [place] Pebbles in palm in preparation of discarding [place] Pebbles on opposite side of [place] board to promote sustained grasp, crossing midline and releasing from the ulnar aspect of the hand.  -Pt. Work on alternating releasing objects from the ulnar aspect of the hand one at a time, to releasing objects from the radial aspect of the hand.  Neuromuscular Re-education: -Facilitated translatory movements, moving 1 Grooved pegs from the palm to the thumb, 2nd, and 3rd digits in preparation of placing grooved pegs into grooved pegboard. -Facilitated FMC tasks using the Grooved pegboard, grasping and 1 grooved pegs from a horizontal position in the shallow dish, and transitioning them to a horizontal position in preparation for placing them upright into the pegboard.   -Facilitated [facility] skills grasping 1 sticks. Pt. Worked on Grasping 1 sticks with the  2nd  digit and thumb, and storing them in the palm. Pt. worked on translatory movements moving the pegs from the palm to the tip of the 2nd digit and thumb in preparation for placing pegs into pegboard. Pt. worked on performing bilateral alternating hand movements while removing pegs from grooved pegboard and discarding into shallow dish.  -Pt. Manipulated 1 pegs at the tip of the 2nd digit and thumb, rotating pegs in the correct vertical position, to place into pegboard slots.     PATIENT EDUCATION: Education details: Grip/Pinch strength, POC, ROM and BUE strength, BUE motor control  Person educated: Patient and Sister Education method: Explanation, Demonstration, Tactile cues, and Verbal cues Education comprehension: verbalized understanding and returned demonstration  HOME EXERCISE PROGRAM: Yellow theraputty HEP   GOALS: Goals reviewed with patient? Yes  SHORT TERM GOALS: Target date: 03/10/2024    Pt. Will be independent utilizing HEPs for hand strengthening exercises.  Baseline: Eval: No current HEP program Goal status: INITIAL   LONG TERM GOALS: Target date: 04/21/2024   Pt. Will improve BUE grip strength by 5# of force to be able to securely hold items. Baseline: Eval: R grip strength: 21#, L grip strength: 14# Goal status: INITIAL  2.  Pt. Will improve RUE [facility] skills by 3 sec. to be able to manipulate zippers/buttons. Baseline: 9 hole peg test; R side: 72 secs, L side: 40 secs Goal status: INITIAL  3.  Pt. Will increase R shoulder flex/shoulder ABD ROM by 10 degrees to perform ADLs/IADLs.  Baseline: R shoulder flex: 50(58), R shoulder ABD: 43(48) Goal status: INITIAL  4.  Pt. Will improve handwriting to 50% legible in cursive form, legibility to be able to send written correspondence Baseline: 10% legible in cursive form, 90% legible in printed form. Goal status: INITIAL  5.  Pt. Will improve BUE pinch strength by 2# of force to  be able to assist with hiking  pants Baseline: R Lateral Pinch: 7, R 3 pt. Pinch: 4, L Lateral Pinch: 4, L 3 pt. Pinch: 5 Goal status: INITIAL  6.  Pt. Will increase R wrist extension/flexion by 5 degrees in anticipation of reaching hold of items for ADLs. Baseline: R Wrist ext: 20(21), R Wrist flex: 30(31) Goal status: INITIAL  ASSESSMENT:  CLINICAL IMPRESSION:  Pt. Tolerated Treatment well today. Pt. Has difficulty with translatory movements in the hand, moving pegs from the palm of the hand to the tips of her 2nd digit and thumb, in preparation of placing pegs into pegboard. Pt. Required Mod vc and visual demonstration for proper placement and technique to perform translatory  movements and rotating and manipulating pegs at the tip of the thumb and 2nd digit. Pt. Continues have difficulty manipulating and rotating 1 Grooved Pegs, to place into grooved pegboard, requiring increased time to complete the task. Pt. Will benefit from OT services to work on RUE ROM, RUE strengthening, bilateral grip strength,  bilateral pinch strength, bilateral FMC skills, and R side awareness and attention in order to improve functional independence of ADL/IADL tasks at home.   PERFORMANCE DEFICITS: in functional skills including ADLs, IADLs, coordination, dexterity, proprioception, sensation, tone, ROM, strength, pain, Fine motor control, Gross motor control, endurance, and UE functional use, and psychosocial skills including environmental adaptation, habits, interpersonal interactions, and routines and behaviors.   IMPAIRMENTS: are limiting patient from ADLs, IADLs, rest and sleep, work, leisure, and social participation.   CO-MORBIDITIES: may have co-morbidities  that affects occupational performance. Patient will benefit from skilled OT to address above impairments and improve overall function.  MODIFICATION OR ASSISTANCE TO COMPLETE EVALUATION: Min-Moderate modification of tasks or assist with assess necessary to complete an  evaluation.  OT OCCUPATIONAL PROFILE AND HISTORY: Detailed assessment: Review of records and additional review of physical, cognitive, psychosocial history related to current functional performance.  CLINICAL DECISION MAKING: Moderate - several treatment options, min-mod task modification necessary  REHAB POTENTIAL: Good  EVALUATION COMPLEXITY: Moderate    PLAN:  OT FREQUENCY: 2x/week  OT DURATION: 12 weeks  PLANNED INTERVENTIONS: 97535 self care/ADL training, 02889 therapeutic exercise, 97530 therapeutic activity, 97112 neuromuscular re-education, 97018 paraffin, 02989 moist heat, 97010 cryotherapy, 97034 contrast bath, 97032 electrical stimulation (manual), passive range of motion, visual/perceptual remediation/compensation, energy conservation, patient/family education, and DME and/or AE instructions  RECOMMENDED OTHER SERVICES: ST & PT  CONSULTED AND AGREED WITH PLAN OF CARE: Patient and family member/caregiver  PLAN FOR NEXT SESSION: treatment  Damien Nap, OTS  This entire session was performed under direct supervision and direction of a licensed therapist/therapist assistant . I have personally read, edited and approve of the note as written.   Richardson Otter, MS, OTR/L   02/13/2024, 5:21 PM

## 2024-02-18 ENCOUNTER — Ambulatory Visit

## 2024-02-18 ENCOUNTER — Ambulatory Visit: Admitting: Occupational Therapy

## 2024-02-18 ENCOUNTER — Ambulatory Visit: Admitting: Speech Pathology

## 2024-02-18 DIAGNOSIS — R2681 Unsteadiness on feet: Secondary | ICD-10-CM

## 2024-02-18 DIAGNOSIS — M6281 Muscle weakness (generalized): Secondary | ICD-10-CM

## 2024-02-18 DIAGNOSIS — R41841 Cognitive communication deficit: Secondary | ICD-10-CM

## 2024-02-18 DIAGNOSIS — R278 Other lack of coordination: Secondary | ICD-10-CM

## 2024-02-18 DIAGNOSIS — I69351 Hemiplegia and hemiparesis following cerebral infarction affecting right dominant side: Secondary | ICD-10-CM

## 2024-02-18 DIAGNOSIS — R269 Unspecified abnormalities of gait and mobility: Secondary | ICD-10-CM

## 2024-02-18 NOTE — Therapy (Signed)
 OUTPATIENT PHYSICAL THERAPY TREATMENT   Patient Name: Haley Jones MRN: [id number] DOB:04-09-1956, 68 y.o., female Today's Date: 02/18/2024  PCP: Dr. Oneil Pinal REFERRING PROVIDER: Toribio Pitch, PA-C  END OF SESSION:   PT End of Session - 02/18/24 1350     Visit Number 7    Number of Visits 24    Date for PT Re-Evaluation 04/21/24    Authorization Type [employer]    Progress Note Due on Visit 10    PT Start Time 1400    PT Stop Time 1440    PT Time Calculation (min) 40 min    Equipment Utilized During Treatment Gait belt    Activity Tolerance Patient tolerated treatment well;No increased pain    Behavior During Therapy [facility] for tasks assessed/performed           Past Medical History:  Diagnosis Date   Adnexal mass    Anxiety    Chronic back pain    Depression    Diabetes mellitus without complication (HCC)    Endometriosis 03/12/2018   GERD (gastroesophageal reflux disease)    Heart murmur 02/2018   undetected until she was an adult. 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   Weight loss 12/06/2017   Past Surgical History:  Procedure Laterality Date   ABDOMINAL HYSTERECTOMY  2008   ovaries also   CESAREAN SECTION  1994   COLONOSCOPY     COLONOSCOPY WITH PROPOFOL  N/A 02/12/2018   Procedure: COLONOSCOPY WITH PROPOFOL ;  Surgeon: Toledo, Ladell POUR, MD;  Location: [facility];  Service: Gastroenterology;  Laterality: N/A;   ESOPHAGOGASTRODUODENOSCOPY (EGD) WITH PROPOFOL  N/A 02/12/2018   Procedure: ESOPHAGOGASTRODUODENOSCOPY (EGD) WITH PROPOFOL ;  Surgeon: Toledo, Ladell POUR, MD;  Location: [facility] END[facility];  Service: Gastroenterology;  Laterality: N/A;   EYE SURGERY Left 1973   muscle shortening to straighten cross eyes   LAPAROSCOPY N/A 02/20/2018   Procedure: LAPAROSCOPY DIAGNOSTIC, ( RESECTION OF RIGHT LOWER QUADRANT ABDOMINAL MASS);  Surgeon: Nicholaus Selinda Birmingham, MD;   Location: [facility] ORS;  Service: General;  Laterality: N/A;   ROTATOR CUFF REPAIR Left 2011   Patient Active Problem List   Diagnosis Date Noted   Urinary incontinence as sequela of cerebrovascular accident (CVA) 02/11/2024   Depression with anxiety 01/04/2024   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   Acute CVA (cerebrovascular accident) (HCC) 12/20/2023   Primary hypertension 12/20/2023   Acute kidney injury superimposed on chronic kidney disease (HCC) 12/20/2023   DM type 2 with diabetic mixed hyperlipidemia (HCC) 11/17/2022   Major depressive disorder, recurrent, 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/17/2022   B12 deficiency 02/23/2021   SBO (small bowel obstruction) (HCC) 01/29/2019   Barrett's esophagus without dysplasia 04/15/2018   Weight loss 12/06/2017   Hoarse 12/06/2017   Lumbar disc disease 07/26/2017   Diabetes mellitus type 2, controlled, without complications (HCC) 02/09/2015   Surgical menopause 11/26/2014    ONSET DATE: 12/20/2023  REFERRING DIAG: P36.487 (ICD-10-CM) - Left middle cerebral artery stroke (HCC)   THERAPY DIAG:  Muscle weakness (generalized)  Other lack of coordination  Hemiplegia and hemiparesis following cerebral infarction affecting right dominant side (HCC)  Abnormality of gait  Unsteadiness on feet  Rationale for Evaluation and Treatment: Rehabilitation  SUBJECTIVE:  SUBJECTIVE STATEMENT: Patient reports doing better and walking more at home. Using my walker at home.   PERTINENT HISTORY:Pt states she is not sure the exact date of when her CVA happened because she kept falling at home trying to care for herself and her daughter, but she finally went to the hospital when her sister insisted on it. Pt states her special needs daughter, [name], passed away recently while pt was  in the hospital at [facility] before she was transferred to [facility] for stroke rehabilitation.Pt's sister states pt requires supervision/cuing for ADLs  to don clothes correctly, with correct orientation (potential apraxia?) Pt/sister report pt has R side inattention.Pt reports supposedly one of her legs is shorter than the other, believes her R LE might be the shorter one. 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     PAIN:  Are you having pain? No  PRECAUTIONS: Fall RED FLAGS: None  WEIGHT BEARING RESTRICTIONS: No FALLS: Has patient fallen in last 6 months? Yes. Number of falls 6+ before going to hospital and 1 fall since being home from CIR - pt states she was trying to gather her clothes by herself rather than waiting for her sister to get there to help her  LIVING ENVIRONMENT Lives with: lives alone and but pt's sister, [name], comes over daily to assist with ADLs and stays to provide supervision all day - pt states at night she would be able to walk to bathroom using RW if needed (but she wears depends in event of incontinence) Lives in: House/apartment Stairs: she has steps, but she doesn't have to use them (house was wheelchair accessible for her daughter)  Has following equipment at home: Environmental consultant - 2 wheeled, shower chair, Grab bars, Ramped entry, and transport chair  PLOF: Independent, Independent with household mobility without device, Independent with homemaking with ambulation, Independent with gait, Independent with transfers, and she was the primary caregiver for her recently deceased daughter   CLOF: Sister provides supervision daily for pt to ambulate using RW safely and provides assist for ADLs (showering and dressing - progressing towards supervision with these activities); Sister states pt requires cues to turn and step back fully prior to sitting to ensure her safety  PATIENT GOALS: get back to being able to go  shopping with improved community level ambulation without the walker; return to driving   OBJECTIVE:  Note: Objective measures were completed at Evaluation unless otherwise noted.  DIAGNOSTIC FINDINGS:   EXAM: MRI HEAD WITHOUT CONTRAST   TECHNIQUE: Multiplanar, multiecho pulse sequences of the brain and surrounding structures were obtained without intravenous contrast.   COMPARISON:  Brain MRI 07/31/2022.  IMPRESSION: 1. Patchy acute cortical/subcortical infarcts within the left parietal and occipital lobes individually measuring up to 3 cm (MCA vascular territory). Subtle petechial hemorrhage associated with some of these infarcts. No frank hemorrhagic conversion. 2. Additional punctate left MCA territory cortical acute infarct within the left insula. 3. Background parenchymal atrophy and chronic small vessel ischemic disease with chronic lacunar infarcts, as described. 4. Paranasal sinus disease as outlined (including severe right maxillary sinusitis).   Electronically Signed   By: Rockey Childs D.O.   On: 12/20/2023 11:52  COGNITION: Overall cognitive status: Within functional limits for tasks assessed   SENSATION: Light touch: initially misses the first 1-2 touches on R LE, but then able to feel remaining touches Proprioception: Impaired grossly  COORDINATION: Symmetrical heel-to-shin bilaterally  EDEMA:  Not formally  assessed, but none observed  MUSCLE TONE: Not formally assessed  POSTURE: rounded shoulders, forward head, and posterior pelvic tilt  LOWER EXTREMITY MMT:    MMT Right Eval Left Eval  Hip flexion 3+, no pain 3+ with some L hip and back pain with this  Hip extension    Hip abduction    Hip adduction    Hip internal rotation    Hip external rotation    Knee flexion 3+ 4  Knee extension 4- 4  Ankle dorsiflexion 3+ 4  Ankle plantarflexion 4- 4  Ankle inversion    Ankle eversion    (Blank rows = not tested)  BED MOBILITY:  Findings: Sit  to supine need to assess Supine to sit need to assess *pt reports some difficulty with bed mobility  TRANSFERS: Sit to stand: SBA  Assistive device utilized: Environmental consultant - 2 wheeled     Stand to sit: SBA  Assistive device utilized: Environmental consultant - 2 wheeled     Chair to chair: SBA  Assistive device utilized: Environmental consultant - 2 wheeled       GAIT: Findings: Gait Characteristics: slight R ankle instability noted, step through pattern, decreased stance time- Right, decreased stride length, and decreased ankle dorsiflexion- Right, Distance walked: ~137ft, Assistive device utilized:Walker - 2 wheeled, Level of assistance: SBA and CGA, and Comments:    FUNCTIONAL TESTS:  5 times sit to stand: 38.75 seconds, using UE support 10 meter walk test: 0.48 m/s using youth RW, requires CGA for safety 6 minute walk test: Visit 2, see note Berg Balance Scale: Visit 2, see note Functional gait assessment: need to assess, when appropriate  PATIENT SURVEYS:  ABC scale 7.5%                                                                                                                             TREATMENT DATE: 02/18/2024    Therapeutic Activities: dynamic therapeutic activities  designed to achieve improved functional performance  -Dynamic high knee march in // bars with 3# AW x 4 -Dynamic lateral step up/over 1/2 foam rolls (x 2) in // bars x 10 using 3# -Dynamic walking in clinic with 3# AW and using RW- 175 feet (min VC for increased step length)  Dynamic forward lunge walk 3# AW - down and back x 2.  -STS from chair, hands free 2 x10    Neuromuscular Re-Education: neuromuscular  reeducation of movement, balance, coordination, kinesthetic  sense, posture and proprioception for sitting and/or standing   -Static stand in // bars with narrow BOS - Static stand in // bars with narrow BOS - EC x 30 sec -Static stand in  // bars with staggered stand- EO x 30 sec -Static stand in  // bars with staggered stand- EC x 30  sec x 2 -Static stand in // bars in tandem position- attempted multiple times each side (more difficult with RLE in rear position)    PATIENT EDUCATION: Education details:  PT POC, findings on assessment today, recommendation to continue using RW for all functional mobility Person educated: Patient and Caregiver [name] Education method: Explanation Education comprehension: verbalized understanding and needs further education  HOME EXERCISE PROGRAM: Access Code: [email or web address] URL: [email or web address] Date: 02/04/2024 Prepared by: Peggye Linear  Exercises - Standing March with Counter Support  - 2 x daily - 7 x weekly - 2 sets - 20 reps - Side stepping with counter support: yellow band loop  - 2 x daily - 7 x weekly - 2 sets - 20 reps - Sit to Stand with Counter Support  - 2 x daily - 7 x weekly - 2 sets - 12 reps   GOALS: Goals reviewed with patient? Yes  SHORT TERM GOALS: Target date: 03/10/2024  Pt will be independent with HEP in order to improve strength and balance in order to decrease fall risk and improve function at home and work.  Baseline: need to initiate Goal status: INITIAL  LONG TERM GOALS: Target date: 04/21/2024  1.  Patient will complete five times sit to stand test in < 15 seconds indicating an increased LE strength and improved balance. Baseline:  38.75 seconds, using UE support  Goal status: INITIAL  2.  Patient will increase ABC scale score >80% to demonstrate better functional mobility and better confidence with ADLs.   Baseline: 7.5% Goal status: INITIAL   3.  Patient will increase Berg Balance score by > 6 points to demonstrate decreased fall risk during functional activities. Baseline: need to assess; 01/30/2024= 34/56 Goal status: INITIAL   4. Patient will increase 10 meter walk test to >1.67m/s as to improve gait speed for better community ambulation and to reduce fall risk. Baseline: 0.48 m/s using youth RW, requires CGA for safety  Goal  status: INITIAL  5. Patient will increase six minute walk test distance to >1000 for progression to community ambulator and improve gait ability Baseline: need to assess; 01/30/2024=410 feet using RW; 02/13/24: 475ft in 18m30s Goal status: MET   ASSESSMENT:  CLINICAL IMPRESSION:  Patient performed well overall- able to respond to VC for picking feet up with gait and balance activities. She was most challenged with dynamic balance including narrowed base of support and tandem today- did some better with practice- quick to reach for hand support if anxious.  Ms. Cuen will benefit from further skilled PT to improve these deficits in order to increase QOL, decrease fall risk, and ease/safety with ADLs.   OBJECTIVE IMPAIRMENTS: Abnormal gait, decreased activity tolerance, decreased balance, decreased endurance, decreased knowledge of use of DME, decreased mobility, difficulty walking, decreased strength, decreased safety awareness, impaired vision/preception, and pain.   ACTIVITY LIMITATIONS: carrying, lifting, bending, standing, squatting, stairs, transfers, bed mobility, bathing, toileting, dressing, reach over head, hygiene/grooming, and locomotion level  PARTICIPATION LIMITATIONS: meal prep, cleaning, laundry, driving, shopping, and community activity  PERSONAL FACTORS: Age, Time since onset of injury/illness/exacerbation, and 3+ comorbidities: Hypertension, B12 deficiency, type 2 diabetes, hyperlipidemia, CKD stage IIIa are also affecting patient's functional outcome.   REHAB POTENTIAL: Good  CLINICAL DECISION MAKING: Evolving/moderate complexity  EVALUATION COMPLEXITY: Moderate  PLAN:  PT FREQUENCY: 1-2x/week  PT DURATION: 12 weeks  PLANNED INTERVENTIONS: 97164- PT Re-evaluation, 97750- Physical Performance Testing, 97110-Therapeutic exercises, 97530- Therapeutic activity, [id number]- Neuromuscular re-education, 97535- Self Care, [id number]- Manual therapy, [id number]- Gait training, [phone]- Orthotic  Initial, [phone]- Orthotic/Prosthetic subsequent, [phone]- Canalith repositioning, [phone]- Electrical stimulation (manual), Patient/Family education, Balance training, Stair training, Taping, Joint mobilization, Spinal mobilization, Vestibular training, Visual/preceptual remediation/compensation, DME instructions,  Cryotherapy, Moist heat, and Biofeedback  PLAN FOR NEXT SESSION:  - FGA when appropriate and add LTG - floor transfers training - continue HIIT - continue gait training without AD for balance and righting (include multi plane, multidirectional)  - dynamic reaching and dynamic stepping balance    2:47 PM, 02/18/24 Chyrl London, PT Physical Therapist - [facility]  Outpatient Physical Therapy- Main Campus [phone]

## 2024-02-18 NOTE — Therapy (Addendum)
 OUTPATIENT OCCUPATIONAL THERAPY NEURO TREATMENT NOTE  Patient Name: Haley Jones MRN: [id number] DOB:[birth year], 68 y.o., female Today's Date: 02/18/2024  PCP: Cleotilde Oneil FALCON, MD REFERRING PROVIDER: Pegge Toribio PARAS, PA-C   OT End of Session - 02/18/24 1519     Visit Number 7    Number of Visits 24    Date for OT Re-Evaluation 04/22/24    OT Start Time 1315    OT Stop Time 1400    OT Time Calculation (min) 45 min    Activity Tolerance Patient tolerated treatment well    Behavior During Therapy [facility] for tasks assessed/performed                    Past Medical History:  Diagnosis Date   Adnexal mass    Anxiety    Chronic back pain    Depression    Diabetes mellitus without complication (HCC)    Endometriosis 03/12/2018   GERD (gastroesophageal reflux disease)    Heart murmur 02/2018   undetected until she was an adult. 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   Weight loss 12/06/2017   Past Surgical History:  Procedure Laterality Date   ABDOMINAL HYSTERECTOMY  2008   ovaries also   CESAREAN SECTION  1994   COLONOSCOPY     COLONOSCOPY WITH PROPOFOL  N/A 02/12/2018   Procedure: COLONOSCOPY WITH PROPOFOL ;  Surgeon: Toledo, Ladell POUR, MD;  Location: [facility] ENDOSCOPY;  Service: Gastroenterology;  Laterality: N/A;   ESOPHAGOGASTRODUODENOSCOPY (EGD) WITH PROPOFOL  N/A 02/12/2018   Procedure: ESOPHAGOGASTRODUODENOSCOPY (EGD) WITH PROPOFOL ;  Surgeon: Toledo, Ladell POUR, MD;  Location: [facility] ENDOSCOPY;  Service: Gastroenterology;  Laterality: N/A;   EYE SURGERY Left 1973   muscle shortening to straighten cross eyes   LAPAROSCOPY N/A 02/20/2018   Procedure: LAPAROSCOPY DIAGNOSTIC, ( RESECTION OF RIGHT LOWER QUADRANT ABDOMINAL MASS);  Surgeon: Nicholaus Selinda Birmingham, MD;  Location: [facility] ORS;  Service: General;  Laterality: N/A;   ROTATOR CUFF REPAIR Left 2011   Patient Active Problem List    Diagnosis Date Noted   Depression with anxiety 01/04/2024   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   Acute CVA (cerebrovascular accident) (HCC) 12/20/2023   Primary hypertension 12/20/2023   Acute kidney injury superimposed on chronic kidney disease (HCC) 12/20/2023   DM type 2 with diabetic mixed hyperlipidemia (HCC) 11/17/2022   Major depressive disorder, recurrent, 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  Rationale for Evaluation and Treatment: Rehabilitation  SUBJECTIVE:   SUBJECTIVE STATEMENT: Pt. reports that her R hand feels better today.  Pt accompanied by: self and family member  PERTINENT HISTORY: Pt. Is a 68 yo female with hx of a L MCA CVA. Pt. Was admitted into ED on 12/20/2023 with an onset of RUE weakness, numbness dysmetria, and visual deficits. Upon assessment, it was concluded that Pt. Experienced multiple infarcts in the L Parietal/Occipital region. PMHx: Dizziness, Falls  PRECAUTIONS: None  WEIGHT BEARING RESTRICTIONS: No  PAIN:  Are you having pain? No  FALLS: Has patient  fallen in last 6 months? Yes. Number of falls 10  LIVING ENVIRONMENT: Lives with: lives with their family and lives alone Lives in: House/apartment Stairs: No, Ramp Has following equipment at home: Walker - 2 wheeled, Shower bench, and bed side commode  PLOF: Independent  PATIENT GOALS: To be able to feed herself and hold a fork and knife better.   OBJECTIVE:  Note: Objective measures were completed at Evaluation unless otherwise noted.  HAND DOMINANCE: Left  ADLs: Overall ADLs:   Transfers/ambulation related to ADLs:  Eating: Independent  Grooming: independent UB Dressing: Pt. Has difficulty managing buttons LB Dressing: jean zippers are difficult to manipulate, hiking pants, and clothing negotiation are difficult. Toileting: Independent Bathing: Independent Tub Shower transfers: stepping into shower is difficult.  Equipment: Grab bars, shower bench, bedside commode  IADLs: Shopping: Does not currently do that/has not tried Light housekeeping: Does not currently do that/has not tried Meal Prep: Has not cooked in months, did not prior to CVA, sister provides meals. Community mobility: Relies on family, and friends Medication management: Assistance from sister to initiate medication management, she gives herself a shot for diabetes Financial management: Sister is assisting with monthly bill management Handwriting: Cursive form is 10% legible; Printed form is 90% legible  MOBILITY STATUS: Needs Assist: CGA and Hx of falls  POSTURE COMMENTS:  No Significant postural limitations Sitting balance: Good  ACTIVITY TOLERANCE: Activity tolerance: Good  FUNCTIONAL OUTCOME MEASURES: MAM-20: TBD  UPPER EXTREMITY ROM:    Active ROM Right eval Left Eval [facility]  Shoulder flexion 50(58)   Shoulder abduction 43(48)   Shoulder adduction    Shoulder extension    Shoulder internal rotation    Shoulder external rotation    Elbow flexion 118(131)   Elbow extension -24(-22)   Wrist flexion 20 (21)   Wrist extension 30(31)   Wrist ulnar deviation    Wrist radial deviation    Wrist pronation    Wrist supination    (Blank rows = not tested)  UPPER EXTREMITY MMT:     MMT Right eval Left eval  Shoulder flexion 2/5 WFL  Shoulder abduction 2/5 [facility]  Shoulder adduction    Shoulder extension    Shoulder internal rotation    Shoulder external rotation    Middle trapezius    Lower trapezius    Elbow flexion 4-/5 WFL  Elbow extension 4-/5 WFL  Wrist flexion 2+/5  WFL  Wrist extension 3-/5 WFL  Wrist ulnar deviation    Wrist radial deviation    Wrist pronation    Wrist supination    (Blank rows = not tested)  HAND FUNCTION: Grip strength: Right: 21 lbs; Left: 14 lbs, Lateral pinch: Right: 7 lbs, Left: 4 lbs, and 3 point pinch: Right: 4 lbs, Left: 5 lbs  COORDINATION: 9 Hole Peg test: Right: 72 sec; Left: 40 sec  SENSATION: Light touch: Impaired  Proprioception: Impaired   EDEMA: none  MUSCLE TONE: RUE: Mild  COGNITION: Overall cognitive status: Impaired  VISION: Subjective report: Pt. Has not noticed the changes in vision but her sister has. Caregiver reports that Pt. Does not bring much attention to the R side.  Baseline vision:  Visual history:   VISION ASSESSMENT: TBD   PERCEPTION: TBD  PRAXIS: Impaired: Motor planning  TREATMENT DATE: 02/18/2024   Therapeutic Activity:  -Pt. Tolerated 5 trials of functional reaching, grasping 1 circular discs on at a time, and reaching up with the RUE to place into slotted target at a vertical angle to promote RUE sustained grasp/pinch, elbow extension and shoulder flexion/elevation.   Therapeutic Ex.:  -Pt. Tolerated 10 reps of AAROM  for right shoulder flexion using towel slides on medium sized inclined wedge, progressing to steep inclined wedge promoting controlled and gentle shoulder flexion. Pt. required support proximally to the RUE during the task.  Neuromuscular Re-education: -Pt. Performed RUE [facility] skills using 1/8 cubed beads to thread onto thin elastic cord, stabilizing cord with L hand.  -Pt. Progressed to thread 1/8 cylindrical beads onto thin elastic cord.  -Pt. Removed 1/8 cubed and cylindrical beads using resistive tweezers to pull beads off of thin elastic cord, sorting the beads into container, to promote sustained pinch, and external rotation of the  shoulder.     PATIENT EDUCATION: Education details: Grip/Pinch strength, POC, ROM and BUE strength, BUE motor control  Person educated: Patient and Sister Education method: Explanation, Demonstration, Tactile cues, and Verbal cues Education comprehension: verbalized understanding and returned demonstration  HOME EXERCISE PROGRAM: Yellow theraputty HEP   GOALS: Goals reviewed with patient? Yes  SHORT TERM GOALS: Target date: 03/10/2024    Pt. Will be independent utilizing HEPs for hand strengthening exercises.  Baseline: Eval: No current HEP program Goal status: INITIAL   LONG TERM GOALS: Target date: 04/21/2024   Pt. Will improve BUE grip strength by 5# of force to be able to securely hold items. Baseline: Eval: R grip strength: 21#, L grip strength: 14# Goal status: INITIAL  2.  Pt. Will improve RUE [facility] skills by 3 sec. to be able to manipulate zippers/buttons. Baseline: 9 hole peg test; R side: 72 secs, L side: 40 secs Goal status: INITIAL  3.  Pt. Will increase R shoulder flex/shoulder ABD ROM by 10 degrees to perform ADLs/IADLs.  Baseline: R shoulder flex: 50(58), R shoulder ABD: 43(48) Goal status: INITIAL  4.  Pt. Will improve handwriting to 50% legible in cursive form, legibility to be able to send written correspondence Baseline: 10% legible in cursive form, 90% legible in printed form. Goal status: INITIAL  5.  Pt. Will improve BUE pinch strength by 2# of force to  be able to assist with hiking pants Baseline: R Lateral Pinch: 7, R 3 pt. Pinch: 4, L Lateral Pinch: 4, L 3 pt. Pinch: 5 Goal status: INITIAL  6.  Pt. Will increase R wrist extension/flexion by 5 degrees in anticipation of reaching hold of items for ADLs. Baseline: R Wrist ext: 20(21), R Wrist flex: 30(31) Goal status: INITIAL  ASSESSMENT:  CLINICAL IMPRESSION:  Pt. tolerated treatment well today. Pt. presents with limited  right shoulder flexion/shoulder abduction AROM while placing circular  discs into slotted targets. Pt. required assistance proximally at the elbow during elbow extension to stabilize during AAROM towel slides. Pt. has difficulty manipulating and rotating 1/8 cubed beads while threading beads onto elastic cord, and sustaining pinch while removing beads from cords. Pt. continues to benefit from OT services to work on RUE ROM, RUE strengthening, bilateral grip strength, bilateral pinch strength, bilateral [facility] skills, and Right sided awareness and attention in order to improve functional independence of ADL/IADL tasks at home.   PERFORMANCE DEFICITS: in functional skills including ADLs, IADLs, coordination, dexterity, proprioception, sensation, tone, ROM, strength, pain, Fine motor control, Gross motor control, endurance, and UE functional  use, and psychosocial skills including environmental adaptation, habits, interpersonal interactions, and routines and behaviors.   IMPAIRMENTS: are limiting patient from ADLs, IADLs, rest and sleep, work, leisure, and social participation.   CO-MORBIDITIES: may have co-morbidities  that affects occupational performance. Patient will benefit from skilled OT to address above impairments and improve overall function.  MODIFICATION OR ASSISTANCE TO COMPLETE EVALUATION: Min-Moderate modification of tasks or assist with assess necessary to complete an evaluation.  OT OCCUPATIONAL PROFILE AND HISTORY: Detailed assessment: Review of records and additional review of physical, cognitive, psychosocial history related to current functional performance.  CLINICAL DECISION MAKING: Moderate - several treatment options, min-mod task modification necessary  REHAB POTENTIAL: Good  EVALUATION COMPLEXITY: Moderate    PLAN:  OT FREQUENCY: 2x/week  OT DURATION: 12 weeks  PLANNED INTERVENTIONS: 97535 self care/ADL training, 02889 therapeutic exercise, 97530 therapeutic activity, 97112 neuromuscular re-education, 97018 paraffin, 02989 moist heat,  97010 cryotherapy, 97034 contrast bath, 97032 electrical stimulation (manual), passive range of motion, visual/perceptual remediation/compensation, energy conservation, patient/family education, and DME and/or AE instructions  RECOMMENDED OTHER SERVICES: ST & PT  CONSULTED AND AGREED WITH PLAN OF CARE: Patient and family member/caregiver  PLAN FOR NEXT SESSION: treatment  Damien Nap, OTS  This entire session was performed under direct supervision and direction of a licensed therapist/therapist assistant . I have personally read, edited and approve of the note as written.   Richardson Otter, MS, OTR/L   02/18/2024, 3:25 PM

## 2024-02-18 NOTE — Therapy (Signed)
 OUTPATIENT SPEECH LANGUAGE PATHOLOGY  COGNITIVE-COMMUNICATION TREATMENT   Patient Name: Haley Jones MRN: [id number] DOB:December 08, 1955, 68 y.o., female Today's Date: 02/18/2024  PCP: Cleotilde Oneil FALCON, MD  REFERRING PROVIDER: Pegge Toribio PARAS, PA-C    End of Session - 02/18/24 1440     Visit Number 6    Number of Visits 25    Date for SLP Re-Evaluation 04/27/24    Authorization Type [employer]    Authorization Time Period 01/28/2024 thru 04/27/2024    Authorization - Visit Number 6    Authorization - Number of Visits 24    Progress Note Due on Visit 10    SLP Start Time 1440    SLP Stop Time  1525    SLP Time Calculation (min) 45 min    Activity Tolerance Patient tolerated treatment well          Past Medical History:  Diagnosis Date   Adnexal mass    Anxiety    Chronic back pain    Depression    Diabetes mellitus without complication (HCC)    Endometriosis 03/12/2018   GERD (gastroesophageal reflux disease)    Heart murmur 02/2018   undetected until she was an adult. 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   Weight loss 12/06/2017   Past Surgical History:  Procedure Laterality Date   ABDOMINAL HYSTERECTOMY  2008   ovaries also   CESAREAN SECTION  1994   COLONOSCOPY     COLONOSCOPY WITH PROPOFOL  N/A 02/12/2018   Procedure: COLONOSCOPY WITH PROPOFOL ;  Surgeon: Toledo, Ladell POUR, MD;  Location: [facility] ENDOSCOPY;  Service: Gastroenterology;  Laterality: N/A;   ESOPHAGOGASTRODUODENOSCOPY (EGD) WITH PROPOFOL  N/A 02/12/2018   Procedure: ESOPHAGOGASTRODUODENOSCOPY (EGD) WITH PROPOFOL ;  Surgeon: Toledo, Ladell POUR, MD;  Location: [facility] ENDOSCOPY;  Service: Gastroenterology;  Laterality: N/A;   EYE SURGERY Left 1973   muscle shortening to straighten cross eyes   LAPAROSCOPY N/A 02/20/2018   Procedure: LAPAROSCOPY DIAGNOSTIC, ( RESECTION OF RIGHT LOWER QUADRANT ABDOMINAL MASS);   Surgeon: Nicholaus Selinda Birmingham, MD;  Location: [facility] ORS;  Service: General;  Laterality: N/A;   ROTATOR CUFF REPAIR Left 2011   Patient Active Problem List   Diagnosis Date Noted   Urinary incontinence as sequela of cerebrovascular accident (CVA) 02/11/2024   Depression with anxiety 01/04/2024   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   Acute CVA (cerebrovascular accident) (HCC) 12/20/2023   Primary hypertension 12/20/2023   Acute kidney injury superimposed on chronic kidney disease (HCC) 12/20/2023   DM type 2 with diabetic mixed hyperlipidemia (HCC) 11/17/2022   Major depressive disorder, recurrent, 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/17/2022   B12 deficiency 02/23/2021   SBO (small bowel obstruction) (HCC) 01/29/2019   Barrett's esophagus without dysplasia 04/15/2018   Weight loss 12/06/2017   Hoarse 12/06/2017   Lumbar disc disease 07/26/2017   Diabetes mellitus type 2, controlled, without complications (HCC) 02/09/2015   Surgical menopause 11/26/2014    ONSET DATE: 12/20/23   REFERRING DIAG: CVA  THERAPY DIAG:  Cognitive communication deficit  Rationale for Evaluation and Treatment Rehabilitation  SUBJECTIVE:   PERTINENT HISTORY: Haley Jones is a 68 year old left-handed female who [facility] to [facility] with right side weakness/falls and dizziness. MRI of the brain showed patchy acute cortical/subcortical infarcts within the left parietal occipital lobes individually measuring up to 3 cm (MCA vascular territory). Subtle petechial hemorrhage associated with some of these infarcts.  No frank hemorrhagic conversion.  Additional punctate left MCA territory cortical acute infarct within the left insula.  Background parenchymal atrophy and chronic small vessel ischemic disease with chronic lacunar infarcts.  CT angio showed severe stenosis/nonocclusive thrombus of the left MCA bifurcation affecting the inferior division.  Nonocclusive embolus also visible within the left parietal branch as  well. Echocardiogram ejection fraction of 60 to 65% no wall motion abnormalities grade 1 diastolic dysfunction. Past medical history also noted for hypertension, B12 deficiency, type 2 diabetes mellitus, hyperlipidemia, CKD stage III, depression.  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 D/c home after CIR admission 12/28/23-01/18/24. Sister lives nearby and checks on patient throughout the day, assists with meals, bathing, and medications.  DIAGNOSTIC FINDINGS: see above  PAIN:  Are you having pain? No   FALLS: Has patient fallen in last 6 months?  Yes, Number of falls: 1 since returning home from rehab, multiple prior to hospitalization  LIVING ENVIRONMENT: Lives with: lives alone Lives in: House/apartment  PLOF:  Level of assistance: Independent with IADLs Employment: Retired   PATIENT GOALS   Pt states she wants to drive again  SUBJECTIVE STATEMENT: Pt alert, cooperative. Talkative, ?hyperverbal. Pt accompanied by: self  OBJECTIVE:   TODAY'S TREATMENT: Skilled treatment session focused on pt's cognitive communication goals. SLP facilitated session by providing the following interventions:  I worked my hand to death doing my putty yesterday doesn't report any difficulty remembering to do HEP  Pt benefited from maximal multimodal cues to complete semi-complex problem solving tasks to achieve 80% accuracy  PATIENT EDUCATION: Education details: as above Person educated: Patient Education method: Verbal cues Education comprehension: verbalized understanding and needs further education   HOME EXERCISE PROGRAM:   To be developed during subsequent therapy sessions   GOALS:  Goals reviewed with patient? Yes  SHORT TERM GOALS: Target date: 10 sessions  The patient will sustain attention in simple cognitive linguistic task for 5 minutes given min verbal cues.  Baseline: Goal status:  INITIAL  2.  Patient will reduce impulsivity by preplanning steps before completing a task with moderate verbal cues. Baseline:  Goal status: INITIAL  3.  Patient will complete Patient Reported Outcome Measure to assess self-perception of deficits.  Baseline:  Goal status: INITIAL   LONG TERM GOALS: Target date: 04/27/2024  Patient will alternate attention between two moderately complex tasks 80% acc, with min verbal cues.  Baseline:  Goal status: INITIAL  2.  Patient will demonstrate emergent awareness by identifying 75% of errors with min cues for double checking. Baseline:  Goal status: INITIAL  3.  Patient will recall safety precautions/mobility recommendations using visual aids if necessary to reduce risk for falls. Baseline:  Goal status: INITIAL   ASSESSMENT:  CLINICAL IMPRESSION: Based on recent assessment, Haley Jones presents with overall moderate cognitive communication impairment per standardized testing using the Cognitive Linguistic Quick Test. Primary deficit areas (severe) include attention, executive function, and visuospatial skills. Patient's impulsivity contributed significantly to testing errors, as did poor sustained attention to testing instructions. Impulsivity impacting safety at home, contributing to fall at home. Patient and sister report reduced frustration tolerance. She is often frustrated when being given instructions. Patient demonstrated some emergent awareness of errors on tasks such as mazes, but was unaware of perseverations in design generation and generative naming, and errors in clock drawing, symbol cancellation, and symbol trails tasks. See details of today's treatment session above. Recommend skilled ST  to target cognitive communication impairments to increase safety and independence.   Prognosis guarded d/t pt's self described pickiness.  OBJECTIVE IMPAIRMENTS include attention, memory, awareness, and executive functioning. These  impairments are limiting patient from managing medications, managing appointments, managing finances, household responsibilities, ADLs/IADLs, and effectively communicating at home and in community. Factors affecting potential to achieve goals and functional outcome are ability to learn/carryover information and severity of impairments. Patient will benefit from skilled SLP services to address above impairments and improve overall function.  REHAB POTENTIAL: Good  PLAN: SLP FREQUENCY: 1-2x/week  SLP DURATION: 12 weeks  PLANNED INTERVENTIONS: Environmental controls, Cueing hierachy, Cognitive reorganization, Internal/external aids, Functional tasks, SLP instruction and feedback, Compensatory strategies, Patient/family education, and 07492 Treatment of speech (30 or 45 min)    Haley Jones, M.S., CCC-SLP, Tree surgeon Certified Brain Injury Specialist [facility]  [facility] Rehabilitation Services Office [phone] Ascom [phone] Fax [phone]

## 2024-02-20 ENCOUNTER — Ambulatory Visit: Admitting: Occupational Therapy

## 2024-02-20 ENCOUNTER — Ambulatory Visit: Admitting: Speech Pathology

## 2024-02-20 ENCOUNTER — Ambulatory Visit: Admitting: Physical Therapy

## 2024-02-20 DIAGNOSIS — R278 Other lack of coordination: Secondary | ICD-10-CM

## 2024-02-20 DIAGNOSIS — R41841 Cognitive communication deficit: Secondary | ICD-10-CM | POA: Diagnosis not present

## 2024-02-20 DIAGNOSIS — M6281 Muscle weakness (generalized): Secondary | ICD-10-CM

## 2024-02-20 DIAGNOSIS — R2681 Unsteadiness on feet: Secondary | ICD-10-CM

## 2024-02-20 DIAGNOSIS — R269 Unspecified abnormalities of gait and mobility: Secondary | ICD-10-CM

## 2024-02-20 NOTE — Therapy (Signed)
 OUTPATIENT PHYSICAL THERAPY TREATMENT   Patient Name: Haley Jones MRN: [id number] DOB:1956-01-31, 68 y.o., female Today's Date: 02/20/2024  PCP: Dr. Oneil Pinal REFERRING PROVIDER: Toribio Pitch, PA-C  END OF SESSION:   PT End of Session - 02/20/24 1312     Visit Number 8    Number of Visits 24    Date for PT Re-Evaluation 04/21/24    Authorization Type [employer]    Progress Note Due on Visit 10    PT Start Time 1317    PT Stop Time 1358    PT Time Calculation (min) 41 min    Equipment Utilized During Treatment Gait belt    Activity Tolerance Patient tolerated treatment well;No increased pain    Behavior During Therapy [facility] for tasks assessed/performed           Past Medical History:  Diagnosis Date   Adnexal mass    Anxiety    Chronic back pain    Depression    Diabetes mellitus without complication (HCC)    Endometriosis 03/12/2018   GERD (gastroesophageal reflux disease)    Heart murmur 02/2018   undetected until she was an adult. 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   Weight loss 12/06/2017   Past Surgical History:  Procedure Laterality Date   ABDOMINAL HYSTERECTOMY  2008   ovaries also   CESAREAN SECTION  1994   COLONOSCOPY     COLONOSCOPY WITH PROPOFOL  N/A 02/12/2018   Procedure: COLONOSCOPY WITH PROPOFOL ;  Surgeon: Toledo, Ladell POUR, MD;  Location: [facility] ENDOSCOPY;  Service: Gastroenterology;  Laterality: N/A;   ESOPHAGOGASTRODUODENOSCOPY (EGD) WITH PROPOFOL  N/A 02/12/2018   Procedure: ESOPHAGOGASTRODUODENOSCOPY (EGD) WITH PROPOFOL ;  Surgeon: Toledo, Ladell POUR, MD;  Location: [facility] ENDOSCOPY;  Service: Gastroenterology;  Laterality: N/A;   EYE SURGERY Left 1973   muscle shortening to straighten cross eyes   LAPAROSCOPY N/A 02/20/2018   Procedure: LAPAROSCOPY DIAGNOSTIC, ( RESECTION OF RIGHT LOWER QUADRANT ABDOMINAL MASS);  Surgeon: Nicholaus Selinda Birmingham, MD;   Location: [facility] ORS;  Service: General;  Laterality: N/A;   ROTATOR CUFF REPAIR Left 2011   Patient Active Problem List   Diagnosis Date Noted   Urinary incontinence as sequela of cerebrovascular accident (CVA) 02/11/2024   Depression with anxiety 01/04/2024   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   Acute CVA (cerebrovascular accident) (HCC) 12/20/2023   Primary hypertension 12/20/2023   Acute kidney injury superimposed on chronic kidney disease (HCC) 12/20/2023   DM type 2 with diabetic mixed hyperlipidemia (HCC) 11/17/2022   Major depressive disorder, recurrent, 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/17/2022   B12 deficiency 02/23/2021   SBO (small bowel obstruction) (HCC) 01/29/2019   Barrett's esophagus without dysplasia 04/15/2018   Weight loss 12/06/2017   Hoarse 12/06/2017   Lumbar disc disease 07/26/2017   Diabetes mellitus type 2, controlled, without complications (HCC) 02/09/2015   Surgical menopause 11/26/2014    ONSET DATE: 12/20/2023  REFERRING DIAG: P36.487 (ICD-10-CM) - Left middle cerebral artery stroke (HCC)   THERAPY DIAG:  Abnormality of gait  Unsteadiness on feet  Rationale for Evaluation and Treatment: Rehabilitation  SUBJECTIVE:  SUBJECTIVE STATEMENT: Patient reports doing better and walking more at home. Using my walker at home.   PERTINENT HISTORY:Pt states she is not sure the exact date of when her CVA happened because she kept falling at home trying to care for herself and her daughter, but she finally went to the hospital when her sister insisted on it. Pt states her special needs daughter, [name], passed away recently while pt was in the hospital at [facility] before she was transferred to [facility] for stroke rehabilitation.Pt's sister states pt requires supervision/cuing for ADLs   to don clothes correctly, with correct orientation (potential apraxia?) Pt/sister report pt has R side inattention.Pt reports supposedly one of her legs is shorter than the other, believes her R LE might be the shorter one. 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     PAIN:  Are you having pain? No  PRECAUTIONS: Fall RED FLAGS: None  WEIGHT BEARING RESTRICTIONS: No FALLS: Has patient fallen in last 6 months? Yes. Number of falls 6+ before going to hospital and 1 fall since being home from CIR - pt states she was trying to gather her clothes by herself rather than waiting for her sister to get there to help her  LIVING ENVIRONMENT Lives with: lives alone and but pt's sister, [name], comes over daily to assist with ADLs and stays to provide supervision all day - pt states at night she would be able to walk to bathroom using RW if needed (but she wears depends in event of incontinence) Lives in: House/apartment Stairs: she has steps, but she doesn't have to use them (house was wheelchair accessible for her daughter)  Has following equipment at home: Environmental consultant - 2 wheeled, shower chair, Grab bars, Ramped entry, and transport chair  PLOF: Independent, Independent with household mobility without device, Independent with homemaking with ambulation, Independent with gait, Independent with transfers, and she was the primary caregiver for her recently deceased daughter   CLOF: Sister provides supervision daily for pt to ambulate using RW safely and provides assist for ADLs (showering and dressing - progressing towards supervision with these activities); Sister states pt requires cues to turn and step back fully prior to sitting to ensure her safety  PATIENT GOALS: get back to being able to go shopping with improved community level ambulation without the walker; return to driving   OBJECTIVE:  Note: Objective measures were completed at Evaluation  unless otherwise noted.  DIAGNOSTIC FINDINGS:   EXAM: MRI HEAD WITHOUT CONTRAST   TECHNIQUE: Multiplanar, multiecho pulse sequences of the brain and surrounding structures were obtained without intravenous contrast.   COMPARISON:  Brain MRI 07/31/2022.  IMPRESSION: 1. Patchy acute cortical/subcortical infarcts within the left parietal and occipital lobes individually measuring up to 3 cm (MCA vascular territory). Subtle petechial hemorrhage associated with some of these infarcts. No frank hemorrhagic conversion. 2. Additional punctate left MCA territory cortical acute infarct within the left insula. 3. Background parenchymal atrophy and chronic small vessel ischemic disease with chronic lacunar infarcts, as described. 4. Paranasal sinus disease as outlined (including severe right maxillary sinusitis).   Electronically Signed   By: Rockey Childs D.O.   On: 12/20/2023 11:52  COGNITION: Overall cognitive status: Within functional limits for tasks assessed   SENSATION: Light touch: initially misses the first 1-2 touches on R LE, but then able to feel remaining touches Proprioception: Impaired grossly  COORDINATION: Symmetrical heel-to-shin bilaterally  EDEMA:  Not formally  assessed, but none observed  MUSCLE TONE: Not formally assessed  POSTURE: rounded shoulders, forward head, and posterior pelvic tilt  LOWER EXTREMITY MMT:    MMT Right Eval Left Eval  Hip flexion 3+, no pain 3+ with some L hip and back pain with this  Hip extension    Hip abduction    Hip adduction    Hip internal rotation    Hip external rotation    Knee flexion 3+ 4  Knee extension 4- 4  Ankle dorsiflexion 3+ 4  Ankle plantarflexion 4- 4  Ankle inversion    Ankle eversion    (Blank rows = not tested)  BED MOBILITY:  Findings: Sit to supine need to assess Supine to sit need to assess *pt reports some difficulty with bed mobility  TRANSFERS: Sit to stand: SBA  Assistive device  utilized: Environmental consultant - 2 wheeled     Stand to sit: SBA  Assistive device utilized: Environmental consultant - 2 wheeled     Chair to chair: SBA  Assistive device utilized: Environmental consultant - 2 wheeled       GAIT: Findings: Gait Characteristics: slight R ankle instability noted, step through pattern, decreased stance time- Right, decreased stride length, and decreased ankle dorsiflexion- Right, Distance walked: ~115ft, Assistive device utilized:Walker - 2 wheeled, Level of assistance: SBA and CGA, and Comments:    FUNCTIONAL TESTS:  5 times sit to stand: 38.75 seconds, using UE support 10 meter walk test: 0.48 m/s using youth RW, requires CGA for safety 6 minute walk test: Visit 2, see note Berg Balance Scale: Visit 2, see note Functional gait assessment: need to assess, when appropriate  PATIENT SURVEYS:  ABC scale 7.5%                                                                                                                             TREATMENT DATE: 02/20/2024  TE- To improve strength, endurance, mobility, and function of specific targeted muscle groups or improve joint range of motion or improve muscle flexibility  Nustep B UE and LE reciprocal movement training x 6 min   Therapeutic Activities: dynamic therapeutic activities  designed to achieve improved functional performance  -Dynamic high knee march in // bars with 3# AW x 5 laps with retro walk back  -Dynamic lateral side step in // bars x 5 laps using 3#  -Dynamic walking in clinic with 3# AW and using RW- 325 feet (min VC for increased step length) cues for increased speed to challenge her cadence and gait speed throughout  Sidestepping to hedgehogs set up in rectangle, cues for hip orientation x 1 lap ea, close CGA   Neuromuscular Re-Education: neuromuscular  reeducation of movement, balance, coordination, kinesthetic  sense, posture and proprioception for sitting and/or standing   - Static stand in // bars with narrow BOS - EC x 30 sec -Static  stand in  // bars with staggered stand R LE post- EC x 30 sec x  2 -wide tandem 2 x 30 sec with eyes open    PATIENT EDUCATION: Education details: PT POC, findings on assessment today, recommendation to continue using RW for all functional mobility Person educated: Patient and Caregiver [name] Education method: Explanation Education comprehension: verbalized understanding and needs further education  HOME EXERCISE PROGRAM: Access Code: [email or web address] URL: [email or web address] Date: 02/04/2024 Prepared by: Peggye Linear  Exercises - Standing March with Counter Support  - 2 x daily - 7 x weekly - 2 sets - 20 reps - Side stepping with counter support: yellow band loop  - 2 x daily - 7 x weekly - 2 sets - 20 reps - Sit to Stand with Counter Support  - 2 x daily - 7 x weekly - 2 sets - 12 reps   GOALS: Goals reviewed with patient? Yes  SHORT TERM GOALS: Target date: 03/10/2024  Pt will be independent with HEP in order to improve strength and balance in order to decrease fall risk and improve function at home and work.  Baseline: need to initiate Goal status: INITIAL  LONG TERM GOALS: Target date: 04/21/2024  1.  Patient will complete five times sit to stand test in < 15 seconds indicating an increased LE strength and improved balance. Baseline:  38.75 seconds, using UE support  Goal status: INITIAL  2.  Patient will increase ABC scale score >80% to demonstrate better functional mobility and better confidence with ADLs.   Baseline: 7.5% Goal status: INITIAL   3.  Patient will increase Berg Balance score by > 6 points to demonstrate decreased fall risk during functional activities. Baseline: need to assess; 01/30/2024= 34/56 Goal status: INITIAL   4. Patient will increase 10 meter walk test to >1.62m/s as to improve gait speed for better community ambulation and to reduce fall risk. Baseline: 0.48 m/s using youth RW, requires CGA for safety  Goal status: INITIAL  5. Patient  will increase six minute walk test distance to >1000 for progression to community ambulator and improve gait ability Baseline: need to assess; 01/30/2024=410 feet using RW; 02/13/24: 475ft in 30m30s Goal status: MET   ASSESSMENT:  CLINICAL IMPRESSION:  Patient performed well overall- able to respond to VC for picking feet up with gait and balance activities.Pt challenged with dynamic sidestepping without UE support, pt slow and relatively unsteady  but improves with practice. Pt also demonstrated difficulty with transfers and on multiple occasion was ready to sit on arm of armchair prior to PT cueing her regarding placement of chair.  Ms. Dobos will benefit from further skilled PT to improve these deficits in order to increase QOL, decrease fall risk, and ease/safety with ADLs.   OBJECTIVE IMPAIRMENTS: Abnormal gait, decreased activity tolerance, decreased balance, decreased endurance, decreased knowledge of use of DME, decreased mobility, difficulty walking, decreased strength, decreased safety awareness, impaired vision/preception, and pain.   ACTIVITY LIMITATIONS: carrying, lifting, bending, standing, squatting, stairs, transfers, bed mobility, bathing, toileting, dressing, reach over head, hygiene/grooming, and locomotion level  PARTICIPATION LIMITATIONS: meal prep, cleaning, laundry, driving, shopping, and community activity  PERSONAL FACTORS: Age, Time since onset of injury/illness/exacerbation, and 3+ comorbidities: Hypertension, B12 deficiency, type 2 diabetes, hyperlipidemia, CKD stage IIIa are also affecting patient's functional outcome.   REHAB POTENTIAL: Good  CLINICAL DECISION MAKING: Evolving/moderate complexity  EVALUATION COMPLEXITY: Moderate  PLAN:  PT FREQUENCY: 1-2x/week  PT DURATION: 12 weeks  PLANNED INTERVENTIONS: 97164- PT Re-evaluation, 97750- Physical Performance Testing, 97110-Therapeutic exercises, 97530- Therapeutic activity, [id number]- Neuromuscular re-education,  97535- Self Care, 02859- Manual therapy,  02883- Gait training, 02239- Orthotic Initial, [id number]- Orthotic/Prosthetic subsequent, [phone]- Canalith repositioning, 02967- Electrical stimulation (manual), Patient/Family education, Balance training, Stair training, Taping, Joint mobilization, Spinal mobilization, Vestibular training, Visual/preceptual remediation/compensation, DME instructions, Cryotherapy, Moist heat, and Biofeedback  PLAN FOR NEXT SESSION:  - FGA when appropriate and add LTG - floor transfers training - continue HIIT - continue gait training without AD for balance and righting (include multi plane, multidirectional)  - dynamic reaching and dynamic stepping balance    1:17 PM, 02/20/24 Note: Portions of this document were prepared using Dragon voice recognition software and although reviewed may contain unintentional dictation errors in syntax, grammar, or spelling.  Lonni KATHEE Gainer PT ,DPT Physical Therapist- [place]  [facility]

## 2024-02-20 NOTE — Therapy (Signed)
 OUTPATIENT SPEECH LANGUAGE PATHOLOGY  COGNITIVE-COMMUNICATION TREATMENT   Patient Name: Haley Jones MRN: [id number] DOB:Feb 28, 1956, 68 y.o., female Today's Date: 02/20/2024  PCP: Haley Jones FALCON, MD  REFERRING PROVIDER: Pegge Jones PARAS, PA-C    End of Session - 02/20/24 1452     Visit Number 7    Number of Visits 25    Date for SLP Re-Evaluation 04/27/24    Authorization Type [employer]    Authorization Time Period 01/28/2024 thru 04/27/2024    Authorization - Visit Number 7    Authorization - Number of Visits 24    Progress Note Due on Visit 10    SLP Start Time 1445    SLP Stop Time  1530    SLP Time Calculation (min) 45 min    Activity Tolerance Patient tolerated treatment well          Past Medical History:  Diagnosis Date   Adnexal mass    Anxiety    Chronic back pain    Depression    Diabetes mellitus without complication (HCC)    Endometriosis 03/12/2018   GERD (gastroesophageal reflux disease)    Heart murmur 02/2018   undetected until she was an adult. 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   Weight loss 12/06/2017   Past Surgical History:  Procedure Laterality Date   ABDOMINAL HYSTERECTOMY  2008   ovaries also   CESAREAN SECTION  1994   COLONOSCOPY     COLONOSCOPY WITH PROPOFOL  N/A 02/12/2018   Procedure: COLONOSCOPY WITH PROPOFOL ;  Surgeon: Toledo, Ladell POUR, MD;  Location: [facility] ENDOSCOPY;  Service: Gastroenterology;  Laterality: N/A;   ESOPHAGOGASTRODUODENOSCOPY (EGD) WITH PROPOFOL  N/A 02/12/2018   Procedure: ESOPHAGOGASTRODUODENOSCOPY (EGD) WITH PROPOFOL ;  Surgeon: Toledo, Ladell POUR, MD;  Location: [facility] ENDOSCOPY;  Service: Gastroenterology;  Laterality: N/A;   EYE SURGERY Left 1973   muscle shortening to straighten cross eyes   LAPAROSCOPY N/A 02/20/2018   Procedure: LAPAROSCOPY DIAGNOSTIC, ( RESECTION OF RIGHT LOWER QUADRANT ABDOMINAL MASS);   Surgeon: Nicholaus Selinda Birmingham, MD;  Location: [facility] ORS;  Service: General;  Laterality: N/A;   ROTATOR CUFF REPAIR Left 2011   Patient Active Problem List   Diagnosis Date Noted   Urinary incontinence as sequela of cerebrovascular accident (CVA) 02/11/2024   Depression with anxiety 01/04/2024   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   Acute CVA (cerebrovascular accident) (HCC) 12/20/2023   Primary hypertension 12/20/2023   Acute kidney injury superimposed on chronic kidney disease (HCC) 12/20/2023   DM type 2 with diabetic mixed hyperlipidemia (HCC) 11/17/2022   Major depressive disorder, recurrent, 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/17/2022   B12 deficiency 02/23/2021   SBO (small bowel obstruction) (HCC) 01/29/2019   Barrett's esophagus without dysplasia 04/15/2018   Weight loss 12/06/2017   Hoarse 12/06/2017   Lumbar disc disease 07/26/2017   Diabetes mellitus type 2, controlled, without complications (HCC) 02/09/2015   Surgical menopause 11/26/2014    ONSET DATE: 12/20/23   REFERRING DIAG: CVA  THERAPY DIAG:  Cognitive communication deficit  Rationale for Evaluation and Treatment Rehabilitation  SUBJECTIVE:   PERTINENT HISTORY: Haley Jones is a 68 year old left-handed female who presented to [facility]/23 with right side weakness/falls and dizziness. MRI of the brain showed patchy acute cortical/subcortical infarcts within the left parietal occipital lobes individually measuring up to 3 cm (MCA vascular territory). Subtle petechial hemorrhage associated with some of these infarcts.  No frank hemorrhagic conversion.  Additional punctate left MCA territory cortical acute infarct within the left insula.  Background parenchymal atrophy and chronic small vessel ischemic disease with chronic lacunar infarcts.  CT angio showed severe stenosis/nonocclusive thrombus of the left MCA bifurcation affecting the inferior division.  Nonocclusive embolus also visible within the left parietal branch as  well. Echocardiogram ejection fraction of 60 to 65% no wall motion abnormalities grade 1 diastolic dysfunction. Past medical history also noted for hypertension, B12 deficiency, type 2 diabetes mellitus, hyperlipidemia, CKD stage III, depression.  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 D/c home after CIR admission 12/28/23-01/18/24. Sister lives nearby and checks on patient throughout the day, assists with meals, bathing, and medications.  DIAGNOSTIC FINDINGS: see above  PAIN:  Are you having pain? No   FALLS: Has patient fallen in last 6 months?  Yes, Number of falls: 1 since returning home from rehab, multiple prior to hospitalization  LIVING ENVIRONMENT: Lives with: lives alone Lives in: House/apartment  PLOF:  Level of assistance: Independent with IADLs Employment: Retired   PATIENT GOALS   Pt states she wants to drive again  SUBJECTIVE STATEMENT: Pt alert, cooperative. Talkative, ?hyperverbal. Pt accompanied by: self  OBJECTIVE:   TODAY'S TREATMENT: Skilled treatment session focused on pt's cognitive communication goals. SLP facilitated session by providing the following interventions:  Pt reports that prior to stroke she used the calendar app on her phone to keep up with appointments. Pt required maximal assistance to enter information regarding upcoming therapy appt d/t deficits in problem solving, potential visual scanning deficits to right (she consistently didn't locate the time function on the right of the screen) and decresae awareness of difficulty. She verbalize a variety of unrelated reasons as to rationale for task being difficulty and inability to complete.    PATIENT EDUCATION: Education details: as above Person educated: Patient Education method: Verbal cues Education comprehension: verbalized understanding and needs further education   HOME EXERCISE PROGRAM:   To be developed  during subsequent therapy sessions   GOALS:  Goals reviewed with patient? Yes  SHORT TERM GOALS: Target date: 10 sessions  The patient will sustain attention in simple cognitive linguistic task for 5 minutes given min verbal cues.  Baseline: Goal status: INITIAL  2.  Patient will reduce impulsivity by preplanning steps before completing a task with moderate verbal cues. Baseline:  Goal status: INITIAL  3.  Patient will complete Patient Reported Outcome Measure to assess self-perception of deficits.  Baseline:  Goal status: INITIAL   LONG TERM GOALS: Target date: 04/27/2024  Patient will alternate attention between two moderately complex tasks 80% acc, with min verbal cues.  Baseline:  Goal status: INITIAL  2.  Patient will demonstrate emergent awareness by identifying 75% of errors with min cues for double checking. Baseline:  Goal status: INITIAL  3.  Patient will recall safety precautions/mobility recommendations using visual aids if necessary to reduce risk for falls. Baseline:  Goal status: INITIAL   ASSESSMENT:  CLINICAL IMPRESSION: Based on recent assessment, Katilin Raynes presents with overall moderate cognitive communication impairment per standardized testing using the Cognitive Linguistic Quick Test. Primary deficit areas (severe) include attention, executive function, and visuospatial skills. Patient's impulsivity contributed significantly to testing errors, as did poor sustained attention to testing instructions. Impulsivity impacting safety at home, contributing to fall at home. Patient and sister report reduced frustration tolerance. She is often frustrated when being given instructions.  Patient demonstrated some emergent awareness of errors on tasks such as mazes, but was unaware of perseverations in design generation and generative naming, and errors in clock drawing, symbol cancellation, and symbol trails tasks. See details of today's treatment session above.  Recommend skilled ST to target cognitive communication impairments to increase safety and independence.   Prognosis guarded d/t severity of deficits and pt's lack of awareness/insight into deficits. For example her sister currently manages pt's appts and pt is not able to enter appts into calendar. She states my sister is bossy and I will just tell her that I don't need her help anymore but pt doesn't have a plan (despite SLP assistance) to bridge gap between inability and dependence.   OBJECTIVE IMPAIRMENTS include attention, memory, awareness, and executive functioning. These impairments are limiting patient from managing medications, managing appointments, managing finances, household responsibilities, ADLs/IADLs, and effectively communicating at home and in community. Factors affecting potential to achieve goals and functional outcome are ability to learn/carryover information and severity of impairments. Patient will benefit from skilled SLP services to address above impairments and improve overall function.  REHAB POTENTIAL: Good  PLAN: SLP FREQUENCY: 1-2x/week  SLP DURATION: 12 weeks  PLANNED INTERVENTIONS: Environmental controls, Cueing hierachy, Cognitive reorganization, Internal/external aids, Functional tasks, SLP instruction and feedback, Compensatory strategies, Patient/family education, and [id number] Treatment of speech (30 or 45 min)    Rakeya Glab B. Rubbie, M.S., CCC-SLP, Tree surgeon Certified Brain Injury Specialist [facility]  [facility] Rehabilitation Services Office [phone] Ascom [phone] Fax [phone]

## 2024-02-20 NOTE — Therapy (Cosign Needed)
 OUTPATIENT OCCUPATIONAL THERAPY NEURO TREATMENT NOTE  Patient Name: Haley Jones MRN: [id number] DOB:1956-08-12, 68 y.o., female Today's Date: 02/20/2024  PCP: Cleotilde Oneil FALCON, MD REFERRING PROVIDER: Pegge Toribio PARAS, PA-C   OT End of Session - 02/20/24 1738     Visit Number 8    Number of Visits 24    Date for OT Re-Evaluation 04/22/24    OT Start Time 1400    OT Stop Time 1445    OT Time Calculation (min) 45 min    Activity Tolerance Patient tolerated treatment well    Behavior During Therapy [facility] for tasks assessed/performed           Past Medical History:  Diagnosis Date   Adnexal mass    Anxiety    Chronic back pain    Depression    Diabetes mellitus without complication (HCC)    Endometriosis 03/12/2018   GERD (gastroesophageal reflux disease)    Heart murmur 02/2018   undetected until she was an adult. 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   Weight loss 12/06/2017   Past Surgical History:  Procedure Laterality Date   ABDOMINAL HYSTERECTOMY  2008   ovaries also   CESAREAN SECTION  1994   COLONOSCOPY     COLONOSCOPY WITH PROPOFOL  N/A 02/12/2018   Procedure: COLONOSCOPY WITH PROPOFOL ;  Surgeon: Toledo, Ladell POUR, MD;  Location: [facility] END[facility]COPY;  Service: Gastroenterology;  Laterality: N/A;   ESOPHAGOGASTRODUODENOSCOPY (EGD) WITH PROPOFOL  N/A 02/12/2018   Procedure: ESOPHAGOGASTRODUODENOSCOPY (EGD) WITH PROPOFOL ;  Surgeon: Toledo, Ladell POUR, MD;  Location: [facility] ENDOSCOPY;  Service: Gastroenterology;  Laterality: N/A;   EYE SURGERY Left 1973   muscle shortening to straighten cross eyes   LAPAROSCOPY N/A 02/20/2018   Procedure: LAPAROSCOPY DIAGNOSTIC, ( RESECTION OF RIGHT LOWER QUADRANT ABDOMINAL MASS);  Surgeon: Nicholaus Selinda Birmingham, MD;  Location: [facility] ORS;  Service: General;  Laterality: N/A;   ROTATOR CUFF REPAIR Left 2011   Patient Active Problem List   Diagnosis Date  Noted   Depression with anxiety 01/04/2024   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   Acute CVA (cerebrovascular accident) (HCC) 12/20/2023   Primary hypertension 12/20/2023   Acute kidney injury superimposed on chronic kidney disease (HCC) 12/20/2023   DM type 2 with diabetic mixed hyperlipidemia (HCC) 11/17/2022   Major depressive disorder, recurrent, 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  Rationale for Evaluation and Treatment: Rehabilitation  SUBJECTIVE:   SUBJECTIVE STATEMENT: Pt. Reports that her L hand is better with manipulating smaller objects rather than her R hand.  Pt accompanied by: self and family member  PERTINENT HISTORY: Pt. Is a 68 yo female with hx of a L MCA CVA. Pt. Was admitted into ED on 12/20/2023 with an onset of RUE weakness, numbness dysmetria, and visual deficits. Upon assessment, it was concluded that Pt. Experienced multiple infarcts in the L Parietal/Occipital region. PMHx: Dizziness, Falls  PRECAUTIONS: None  WEIGHT BEARING RESTRICTIONS: No  PAIN:  Are you having pain? No  FALLS: Has patient fallen  in last 6 months? Yes. Number of falls 10  LIVING ENVIRONMENT: Lives with: lives with their family and lives alone Lives in: House/apartment Stairs: No, Ramp Has following equipment at home: Walker - 2 wheeled, Shower bench, and bed side commode  PLOF: Independent  PATIENT GOALS: To be able to feed herself and hold a fork and knife better.   OBJECTIVE:  Note: Objective measures were completed at Evaluation unless otherwise noted.  HAND  DOMINANCE: Left  ADLs: Overall ADLs:  Transfers/ambulation related to ADLs:  Eating: Independent  Grooming: independent UB Dressing: Pt. Has difficulty managing buttons LB Dressing: jean zippers are difficult to manipulate, hiking pants, and clothing negotiation are difficult. Toileting: Independent Bathing: Independent Tub Shower transfers: stepping into shower is difficult.  Equipment: Grab bars, shower bench, bedside commode  IADLs: Shopping: Does not currently do that/has not tried Light housekeeping: Does not currently do that/has not tried Meal Prep: Has not cooked in months, did not prior to CVA, sister provides meals. Community mobility: Relies on family, and friends Medication management: Assistance from sister to initiate medication management, she gives herself a shot for diabetes Financial management: Sister is assisting with monthly bill management Handwriting: Cursive form is 10% legible; Printed form is 90% legible  MOBILITY STATUS: Needs Assist: CGA and Hx of falls  POSTURE COMMENTS:  No Significant postural limitations Sitting balance: Good  ACTIVITY TOLERANCE: Activity tolerance: Good  FUNCTIONAL OUTCOME MEASURES: MAM-20: TBD  UPPER EXTREMITY ROM:    Active ROM Right eval Left Eval [facility]  Shoulder flexion 50(58)   Shoulder abduction 43(48)   Shoulder adduction    Shoulder extension    Shoulder internal rotation    Shoulder external rotation    Elbow flexion 118(131)   Elbow extension -24(-22)   Wrist flexion 20 (21)   Wrist extension 30(31)   Wrist ulnar deviation    Wrist radial deviation    Wrist pronation    Wrist supination    (Blank rows = not tested)  UPPER EXTREMITY MMT:     MMT Right eval Left eval  Shoulder flexion 2/5 WFL  Shoulder abduction 2/5 [facility]  Shoulder adduction    Shoulder extension    Shoulder internal rotation    Shoulder external rotation    Middle trapezius    Lower trapezius    Elbow flexion 4-/5 WFL  Elbow  extension 4-/5 WFL  Wrist flexion 2+/5 WFL  Wrist extension 3-/5 WFL  Wrist ulnar deviation    Wrist radial deviation    Wrist pronation    Wrist supination    (Blank rows = not tested)  HAND FUNCTION: Grip strength: Right: 21 lbs; Left: 14 lbs, Lateral pinch: Right: 7 lbs, Left: 4 lbs, and 3 point pinch: Right: 4 lbs, Left: 5 lbs  COORDINATION: 9 Hole Peg test: Right: 72 sec; Left: 40 sec  SENSATION: Light touch: Impaired  Proprioception: Impaired   EDEMA: none  MUSCLE TONE: RUE: Mild  COGNITION: Overall cognitive status: Impaired  VISION: Subjective report: Pt. Has not noticed the changes in vision but her sister has. Caregiver reports that Pt. Does not bring much attention to the R side.  Baseline vision:  Visual history:   VISION ASSESSMENT: TBD   PERCEPTION: TBD  PRAXIS: Impaired: Motor planning  TREATMENT DATE: 02/20/2024   Neuromuscular Re-education: -Pt. Performed RUE [facility] skills using resistive tweezers to pinch 1/8 cubed beads to pinch cubes out of multiple shallow dishes to place beads into container, challenging sustained 2 pt. Pinch during elbow extension. -Pt. Worked on [facility] skills using resistive tweezers to pinch 1/8 cylindrical beads from shallow dishes to place onto mini pegboard to promote a sustained pinch while transferring beads onto pegboard, to position beads in an upright position. -Pt. Removed 1/8 cylindrical beads from mini pegboard, and discarded beads into container placed at tabletop surface.   -Facilitated [facility]  skills grasping 1 sticks,  cylindrical collars, and  flat washers on the [place] pegboard. Pt. Worked on Grasping each item with the  2nd digit and thumb, and storing them in the palm. Pt. worked on translatory movements moving the the pegs from the palm to the tip of the 2nd digit and thumb in preparation for  discarding them. Pt. worked on performing bilateral alternating hand movements.      PATIENT EDUCATION: Education details: Grip/Pinch strength, POC, ROM and BUE strength, BUE motor control  Person educated: Patient and Sister Education method: Explanation, Demonstration, Tactile cues, and Verbal cues Education comprehension: verbalized understanding and returned demonstration  HOME EXERCISE PROGRAM: Yellow theraputty HEP   GOALS: Goals reviewed with patient? Yes  SHORT TERM GOALS: Target date: 03/10/2024    Pt. Will be independent utilizing HEPs for hand strengthening exercises.  Baseline: Eval: No current HEP program Goal status: INITIAL   LONG TERM GOALS: Target date: 04/21/2024   Pt. Will improve BUE grip strength by 5# of force to be able to securely hold items. Baseline: Eval: R grip strength: 21#, L grip strength: 14# Goal status: INITIAL  2.  Pt. Will improve RUE [facility] skills by 3 sec. to be able to manipulate zippers/buttons. Baseline: 9 hole peg test; R side: 72 secs, L side: 40 secs Goal status: INITIAL  3.  Pt. Will increase R shoulder flex/shoulder ABD ROM by 10 degrees to perform ADLs/IADLs.  Baseline: R shoulder flex: 50(58), R shoulder ABD: 43(48) Goal status: INITIAL  4.  Pt. Will improve handwriting to 50% legible in cursive form, legibility to be able to send written correspondence Baseline: 10% legible in cursive form, 90% legible in printed form. Goal status: INITIAL  5.  Pt. Will improve BUE pinch strength by 2# of force to  be able to assist with hiking pants Baseline: R Lateral Pinch: 7, R 3 pt. Pinch: 4, L Lateral Pinch: 4, L 3 pt. Pinch: 5 Goal status: INITIAL  6.  Pt. Will increase R wrist extension/flexion by 5 degrees in anticipation of reaching hold of items for ADLs. Baseline: R Wrist ext: 20(21), R Wrist flex: 30(31) Goal status: INITIAL  ASSESSMENT:  CLINICAL IMPRESSION:  Pt. tolerated treatment well today. Pt. Has difficulty  manipulating 1/8 cubed and cylindrical beads using resistive tweezers from shallow dishes. Pt. Has difficulty with with performing translatory movements and grasping 1 sticks,  cylindrical collars, and  flat washers with the RUE. Pt. Presented with a low frustration threshold when progressing to more challenging [facility] tasks on the R hand. Pt. continues to benefit from OT services to work on RUE ROM, RUE strengthening, bilateral grip strength, bilateral pinch strength, bilateral [facility] skills, and Right sided awareness and attention in order to improve functional independence of ADL/IADL tasks at home.   PERFORMANCE DEFICITS: in functional skills including ADLs, IADLs, coordination, dexterity, proprioception, sensation, tone, ROM, strength, pain, Fine motor control,  Gross motor control, endurance, and UE functional use, and psychosocial skills including environmental adaptation, habits, interpersonal interactions, and routines and behaviors.   IMPAIRMENTS: are limiting patient from ADLs, IADLs, rest and sleep, work, leisure, and social participation.   CO-MORBIDITIES: may have co-morbidities  that affects occupational performance. Patient will benefit from skilled OT to address above impairments and improve overall function.  MODIFICATION OR ASSISTANCE TO COMPLETE EVALUATION: Min-Moderate modification of tasks or assist with assess necessary to complete an evaluation.  OT OCCUPATIONAL PROFILE AND HISTORY: Detailed assessment: Review of records and additional review of physical, cognitive, psychosocial history related to current functional performance.  CLINICAL DECISION MAKING: Moderate - several treatment options, min-mod task modification necessary  REHAB POTENTIAL: Good  EVALUATION COMPLEXITY: Moderate    PLAN:  OT FREQUENCY: 2x/week  OT DURATION: 12 weeks  PLANNED INTERVENTIONS: 97535 self care/ADL training, 02889 therapeutic exercise, 97530 therapeutic activity, 97112 neuromuscular  re-education, 97018 paraffin, 02989 moist heat, 97010 cryotherapy, 97034 contrast bath, 97032 electrical stimulation (manual), passive range of motion, visual/perceptual remediation/compensation, energy conservation, patient/family education, and DME and/or AE instructions  RECOMMENDED OTHER SERVICES: ST & PT  CONSULTED AND AGREED WITH PLAN OF CARE: Patient and family member/caregiver  PLAN FOR NEXT SESSION: treatment  Damien Nap, OTS  This entire session was performed under direct supervision and direction of a licensed therapist/therapist assistant . I have personally read, edited and approve of the note as written.   Richardson Otter, MS, OTR/L   02/20/2024, 5:40 PM

## 2024-02-25 ENCOUNTER — Ambulatory Visit: Admitting: Physical Therapy

## 2024-02-25 ENCOUNTER — Ambulatory Visit: Admitting: Speech Pathology

## 2024-02-25 DIAGNOSIS — R2681 Unsteadiness on feet: Secondary | ICD-10-CM

## 2024-02-25 DIAGNOSIS — R41841 Cognitive communication deficit: Secondary | ICD-10-CM | POA: Diagnosis not present

## 2024-02-25 DIAGNOSIS — I69351 Hemiplegia and hemiparesis following cerebral infarction affecting right dominant side: Secondary | ICD-10-CM

## 2024-02-25 DIAGNOSIS — R269 Unspecified abnormalities of gait and mobility: Secondary | ICD-10-CM

## 2024-02-25 NOTE — Therapy (Signed)
 OUTPATIENT SPEECH LANGUAGE PATHOLOGY  COGNITIVE-COMMUNICATION TREATMENT   Patient Name: Haley Jones MRN: [id number] DOB:Jan 01, 1956, 68 y.o., female Today's Date: 02/25/2024  PCP: Cleotilde Oneil FALCON, MD  REFERRING PROVIDER: Pegge Toribio PARAS, PA-C    End of Session - 02/25/24 1319     Visit Number 8    Number of Visits 25    Date for SLP Re-Evaluation 04/27/24    Authorization Type [employer]    Authorization Time Period 01/28/2024 thru 04/27/2024    Authorization - Visit Number 8    Authorization - Number of Visits 24    Progress Note Due on Visit 10    SLP Start Time 1315    SLP Stop Time  1400    SLP Time Calculation (min) 45 min    Activity Tolerance Patient tolerated treatment well          Past Medical History:  Diagnosis Date   Adnexal mass    Anxiety    Chronic back pain    Depression    Diabetes mellitus without complication (HCC)    Endometriosis 03/12/2018   GERD (gastroesophageal reflux disease)    Heart murmur 02/2018   undetected until she was an adult. 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   Weight loss 12/06/2017   Past Surgical History:  Procedure Laterality Date   ABDOMINAL HYSTERECTOMY  2008   ovaries also   CESAREAN SECTION  1994   COLONOSCOPY     COLONOSCOPY WITH PROPOFOL  N/A 02/12/2018   Procedure: COLONOSCOPY WITH PROPOFOL ;  Surgeon: Toledo, Ladell POUR, MD;  Location: [facility] ENDOSCOPY;  Service: Gastroenterology;  Laterality: N/A;   ESOPHAGOGASTRODUODENOSCOPY (EGD) WITH PROPOFOL  N/A 02/12/2018   Procedure: ESOPHAGOGASTRODUODENOSCOPY (EGD) WITH PROPOFOL ;  Surgeon: Toledo, Ladell POUR, MD;  Location: [facility] ENDOSCOPY;  Service: Gastroenterology;  Laterality: N/A;   EYE SURGERY Left 1973   muscle shortening to straighten cross eyes   LAPAROSCOPY N/A 02/20/2018   Procedure: LAPAROSCOPY DIAGNOSTIC, ( RESECTION OF RIGHT LOWER QUADRANT ABDOMINAL MASS);   Surgeon: Nicholaus Selinda Birmingham, MD;  Location: [facility] ORS;  Service: General;  Laterality: N/A;   ROTATOR CUFF REPAIR Left 2011   Patient Active Problem List   Diagnosis Date Noted   Urinary incontinence as sequela of cerebrovascular accident (CVA) 02/11/2024   Depression with anxiety 01/04/2024   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   Acute CVA (cerebrovascular accident) (HCC) 12/20/2023   Primary hypertension 12/20/2023   Acute kidney injury superimposed on chronic kidney disease (HCC) 12/20/2023   DM type 2 with diabetic mixed hyperlipidemia (HCC) 11/17/2022   Major depressive disorder, recurrent, 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/17/2022   B12 deficiency 02/23/2021   SBO (small bowel obstruction) (HCC) 01/29/2019   Barrett's esophagus without dysplasia 04/15/2018   Weight loss 12/06/2017   Hoarse 12/06/2017   Lumbar disc disease 07/26/2017   Diabetes mellitus type 2, controlled, without complications (HCC) 02/09/2015   Surgical menopause 11/26/2014    ONSET DATE: 12/20/23   REFERRING DIAG: CVA  THERAPY DIAG:  Cognitive communication deficit  Rationale for Evaluation and Treatment Rehabilitation  SUBJECTIVE:   PERTINENT HISTORY: Tishara K. Dolata is a 68 year old left-handed female who presented to [facility] on 12/20/23 with right side weakness/falls and dizziness. MRI of the brain showed patchy acute cortical/subcortical infarcts within the left parietal occipital lobes individually measuring up to 3 cm (MCA vascular territory). Subtle petechial hemorrhage associated with some of these infarcts.  No frank hemorrhagic conversion.  Additional punctate left MCA territory cortical acute infarct within the left insula.  Background parenchymal atrophy and chronic small vessel ischemic disease with chronic lacunar infarcts.  CT angio showed severe stenosis/nonocclusive thrombus of the left MCA bifurcation affecting the inferior division.  Nonocclusive embolus also visible within the left parietal branch as  well. Echocardiogram ejection fraction of 60 to 65% no wall motion abnormalities grade 1 diastolic dysfunction. Past medical history also noted for hypertension, B12 deficiency, type 2 diabetes mellitus, hyperlipidemia, CKD stage III, depression.  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 D/c home after CIR admission 12/28/23-01/18/24. Sister lives nearby and checks on patient throughout the day, assists with meals, bathing, and medications.  DIAGNOSTIC FINDINGS: see above  PAIN:  Are you having pain? No   FALLS: Has patient fallen in last 6 months?  Yes, Number of falls: 1 since returning home from rehab, multiple prior to hospitalization  LIVING ENVIRONMENT: Lives with: lives alone Lives in: House/apartment  PLOF:  Level of assistance: Independent with IADLs Employment: Retired   PATIENT GOALS   Pt states she wants to drive again  SUBJECTIVE STATEMENT: Pt alert, cooperative. Talkative, ?hyperverbal. Pt accompanied by: self  OBJECTIVE:   TODAY'S TREATMENT: Skilled treatment session focused on pt's cognitive communication goals. SLP facilitated session by providing the following interventions:  I spent a lot of time putting stuff in my calendar - Pt had put her therapy appts on the correct day and had typed the correct information regarding time, type of therapy (PT/OT/ST) and therapist name into the subject line for each appt. When inspecting the time assigned to each appt, pt stated she was not aware of that detail within the calendar and had never entered such information. Pt allowed this writer to historically review appts from 2024. Pt is correct in her description - she had written the time, type of appt within the subject line but each appt was assigned different times from the correct information typed into the subject line. It appears pt used the calendar for a list of appts and didn't actually  assign the appts to a time slot. This was functional for the pt.    PATIENT EDUCATION: Education details: as above Person educated: Patient Education method: Verbal cues Education comprehension: verbalized understanding and needs further education   HOME EXERCISE PROGRAM:   To be developed during subsequent therapy sessions   GOALS:  Goals reviewed with patient? Yes  SHORT TERM GOALS: Target date: 10 sessions  The patient will sustain attention in simple cognitive linguistic task for 5 minutes given min verbal cues.  Baseline: Goal status: INITIAL  2.  Patient will reduce impulsivity by preplanning steps before completing a task with moderate verbal cues. Baseline:  Goal status: INITIAL  3.  Patient will complete Patient Reported Outcome Measure to assess self-perception of deficits.  Baseline:  Goal status: INITIAL   LONG TERM GOALS: Target date: 04/27/2024  Patient will alternate attention between two moderately complex tasks 80% acc, with min verbal cues.  Baseline:  Goal status: INITIAL  2.  Patient will demonstrate emergent awareness by identifying 75% of errors with min cues for double checking. Baseline:  Goal status: INITIAL  3.  Patient will recall safety precautions/mobility recommendations using visual aids if necessary to reduce risk for falls. Baseline:  Goal status: INITIAL   ASSESSMENT:  CLINICAL IMPRESSION: Based on recent assessment, Taquana Bartley presents with  overall moderate cognitive communication impairment per standardized testing using the Cognitive Linguistic Quick Test. Primary deficit areas (severe) include attention, executive function, and visuospatial skills. Patient's impulsivity contributed significantly to testing errors, as did poor sustained attention to testing instructions. Impulsivity impacting safety at home, contributing to fall at home. Patient and sister report reduced frustration tolerance. She is often frustrated when being  given instructions. Patient demonstrated some emergent awareness of errors on tasks such as mazes, but was unaware of perseverations in design generation and generative naming, and errors in clock drawing, symbol cancellation, and symbol trails tasks.   Question to what extend pt's task performance was baseline vs acute d/t CVA. This Clinical research associate asked if pt's sister could come to another therapy session to help with establishing baseline approach to tasks vs acute deficits. Pt requested to leave her (sister) out of it because she doesn't like how I do anything.  See the above treatment note for details regarding baseline approach to calendar entries.   OBJECTIVE IMPAIRMENTS include attention, memory, awareness, and executive functioning. These impairments are limiting patient from managing medications, managing appointments, managing finances, household responsibilities, ADLs/IADLs, and effectively communicating at home and in community. Factors affecting potential to achieve goals and functional outcome are ability to learn/carryover information and severity of impairments. Patient will benefit from skilled SLP services to address above impairments and improve overall function.  REHAB POTENTIAL: Good  PLAN: SLP FREQUENCY: 1-2x/week  SLP DURATION: 12 weeks  PLANNED INTERVENTIONS: Environmental controls, Cueing hierachy, Cognitive reorganization, Internal/external aids, Functional tasks, SLP instruction and feedback, Compensatory strategies, Patient/family education, and 07492 Treatment of speech (30 or 45 min)    Tramond Slinker B. Rubbie, M.S., CCC-SLP, Tree surgeon Certified Brain Injury Specialist [facility]  [facility] Services Office [phone] Ascom [phone] Fax [phone]

## 2024-02-25 NOTE — Therapy (Signed)
 OUTPATIENT PHYSICAL THERAPY TREATMENT   Patient Name: Haley Jones MRN: [id number] DOB:1955/08/16, 68 y.o., female Today's Date: 02/25/2024  PCP: Dr. Oneil Pinal REFERRING PROVIDER: Toribio Pitch, PA-C  END OF SESSION:   PT End of Session - 02/25/24 1542     Visit Number 9    Number of Visits 24    Date for PT Re-Evaluation 04/21/24    Authorization Type [employer]    Progress Note Due on Visit 10    PT Start Time 1401    PT Stop Time 1442    PT Time Calculation (min) 41 min    Equipment Utilized During Treatment Gait belt    Activity Tolerance Patient tolerated treatment well;No increased pain    Behavior During Therapy [facility] for tasks assessed/performed            Past Medical History:  Diagnosis Date   Adnexal mass    Anxiety    Chronic back pain    Depression    Diabetes mellitus without complication (HCC)    Endometriosis 03/12/2018   GERD (gastroesophageal reflux disease)    Heart murmur 02/2018   undetected until she was an adult. 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   Weight loss 12/06/2017   Past Surgical History:  Procedure Laterality Date   ABDOMINAL HYSTERECTOMY  2008   ovaries also   CESAREAN SECTION  1994   COLONOSCOPY     COLONOSCOPY WITH PROPOFOL  N/A 02/12/2018   Procedure: COLONOSCOPY WITH PROPOFOL ;  Surgeon: Toledo, Ladell POUR, MD;  Location: [facility] ENDOSCOPY;  Service: Gastroenterology;  Laterality: N/A;   ESOPHAGOGASTRODUODENOSCOPY (EGD) WITH PROPOFOL  N/A 02/12/2018   Procedure: ESOPHAGOGASTRODUODENOSCOPY (EGD) WITH PROPOFOL ;  Surgeon: Toledo, Ladell POUR, MD;  Location: [facility] ENDOSCOPY;  Service: Gastroenterology;  Laterality: N/A;   EYE SURGERY Left 1973   muscle shortening to straighten cross eyes   LAPAROSCOPY N/A 02/20/2018   Procedure: LAPAROSCOPY DIAGNOSTIC, ( RESECTION OF RIGHT LOWER QUADRANT ABDOMINAL MASS);  Surgeon: Nicholaus Selinda [place], MD;   Location: [facility] ORS;  Service: General;  Laterality: N/A;   ROTATOR CUFF REPAIR Left 2011   Patient Active Problem List   Diagnosis Date Noted   Urinary incontinence as sequela of cerebrovascular accident (CVA) 02/11/2024   Depression with anxiety 01/04/2024   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   Acute CVA (cerebrovascular accident) (HCC) 12/20/2023   Primary hypertension 12/20/2023   Acute kidney injury superimposed on chronic kidney disease (HCC) 12/20/2023   DM type 2 with diabetic mixed hyperlipidemia (HCC) 11/17/2022   Major depressive disorder, recurrent, 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/17/2022   B12 deficiency 02/23/2021   SBO (small bowel obstruction) (HCC) 01/29/2019   Barrett's esophagus without dysplasia 04/15/2018   Weight loss 12/06/2017   Hoarse 12/06/2017   Lumbar disc disease 07/26/2017   Diabetes mellitus type 2, controlled, without complications (HCC) 02/09/2015   Surgical menopause 11/26/2014    ONSET DATE: 12/20/2023  REFERRING DIAG: P36.487 (ICD-10-CM) - Left middle cerebral artery stroke (HCC)   THERAPY DIAG:  Abnormality of gait  Unsteadiness on feet  Hemiplegia and hemiparesis following cerebral infarction affecting right dominant side (HCC)  Rationale for Evaluation and Treatment: Rehabilitation  SUBJECTIVE:  SUBJECTIVE STATEMENT:  Patient reports doing better and walking more at home. No falls or significant changes.   PERTINENT HISTORY:Pt states she is not sure the exact date of when her CVA happened because she kept falling at home trying to care for herself and her daughter, but she finally went to the hospital when her sister insisted on it. Pt states her special needs daughter, [name], passed away recently while pt was in the hospital at [facility] before she was transferred  to [facility] for stroke rehabilitation.Pt's sister states pt requires supervision/cuing for ADLs  to don clothes correctly, with correct orientation (potential apraxia?) Pt/sister report pt has R side inattention.Pt reports supposedly one of her legs is shorter than the other, believes her R LE might be the shorter one. 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     PAIN:  Are you having pain? No  PRECAUTIONS: Fall RED FLAGS: None  WEIGHT BEARING RESTRICTIONS: No FALLS: Has patient fallen in last 6 months? Yes. Number of falls 6+ before going to hospital and 1 fall since being home from CIR - pt states she was trying to gather her clothes by herself rather than waiting for her sister to get there to help her  LIVING ENVIRONMENT Lives with: lives alone and but pt's sister, [name], comes over daily to assist with ADLs and stays to provide supervision all day - pt states at night she would be able to walk to bathroom using RW if needed (but she wears depends in event of incontinence) Lives in: House/apartment Stairs: she has steps, but she doesn't have to use them (house was wheelchair accessible for her daughter)  Has following equipment at home: Environmental consultant - 2 wheeled, shower chair, Grab bars, Ramped entry, and transport chair  PLOF: Independent, Independent with household mobility without device, Independent with homemaking with ambulation, Independent with gait, Independent with transfers, and she was the primary caregiver for her recently deceased daughter   CLOF: Sister provides supervision daily for pt to ambulate using RW safely and provides assist for ADLs (showering and dressing - progressing towards supervision with these activities); Sister states pt requires cues to turn and step back fully prior to sitting to ensure her safety  PATIENT GOALS: get back to being able to go shopping with improved community level ambulation  without the walker; return to driving   OBJECTIVE:  Note: Objective measures were completed at Evaluation unless otherwise noted.  DIAGNOSTIC FINDINGS:   EXAM: MRI HEAD WITHOUT CONTRAST   TECHNIQUE: Multiplanar, multiecho pulse sequences of the brain and surrounding structures were obtained without intravenous contrast.   COMPARISON:  Brain MRI 07/31/2022.  IMPRESSION: 1. Patchy acute cortical/subcortical infarcts within the left parietal and occipital lobes individually measuring up to 3 cm (MCA vascular territory). Subtle petechial hemorrhage associated with some of these infarcts. No frank hemorrhagic conversion. 2. Additional punctate left MCA territory cortical acute infarct within the left insula. 3. Background parenchymal atrophy and chronic small vessel ischemic disease with chronic lacunar infarcts, as described. 4. Paranasal sinus disease as outlined (including severe right maxillary sinusitis).   Electronically Signed   By: Rockey Childs D.O.   On: 12/20/2023 11:52  COGNITION: Overall cognitive status: Within functional limits for tasks assessed   SENSATION: Light touch: initially misses the first 1-2 touches on R LE, but then able to feel remaining touches Proprioception: Impaired grossly  COORDINATION: Symmetrical heel-to-shin bilaterally  EDEMA:  Not  formally assessed, but none observed  MUSCLE TONE: Not formally assessed  POSTURE: rounded shoulders, forward head, and posterior pelvic tilt  LOWER EXTREMITY MMT:    MMT Right Eval Left Eval  Hip flexion 3+, no pain 3+ with some L hip and back pain with this  Hip extension    Hip abduction    Hip adduction    Hip internal rotation    Hip external rotation    Knee flexion 3+ 4  Knee extension 4- 4  Ankle dorsiflexion 3+ 4  Ankle plantarflexion 4- 4  Ankle inversion    Ankle eversion    (Blank rows = not tested)  BED MOBILITY:  Findings: Sit to supine need to assess Supine to sit need to  assess *pt reports some difficulty with bed mobility  TRANSFERS: Sit to stand: SBA  Assistive device utilized: Environmental consultant - 2 wheeled     Stand to sit: SBA  Assistive device utilized: Environmental consultant - 2 wheeled     Chair to chair: SBA  Assistive device utilized: Environmental consultant - 2 wheeled       GAIT: Findings: Gait Characteristics: slight R ankle instability noted, step through pattern, decreased stance time- Right, decreased stride length, and decreased ankle dorsiflexion- Right, Distance walked: ~174ft, Assistive device utilized:Walker - 2 wheeled, Level of assistance: SBA and CGA, and Comments:    FUNCTIONAL TESTS:  5 times sit to stand: 38.75 seconds, using UE support 10 meter walk test: 0.48 m/s using youth RW, requires CGA for safety 6 minute walk test: Visit 2, see note Berg Balance Scale: Visit 2, see note Functional gait assessment: need to assess, when appropriate  PATIENT SURVEYS:  ABC scale 7.5%                                                                                                                             TREATMENT DATE: 02/25/2024  TE- To improve strength, endurance, mobility, and function of specific targeted muscle groups or improve joint range of motion or improve muscle flexibility  Nustep B UE and LE reciprocal movement training x 3 min, LE only x 3 min   Therapeutic Activities: dynamic therapeutic activities  designed to achieve improved functional performance  Gait with 3# AW x 160 ft  STS x 10 no UE assist  Gait with 3#AW without AD 2 x 50 ft  -Dynamic march at support bar x 10 ea side  - hip ABD at support bar 2 x 10 ea side  - dynamic side step over 1/2 foam roll, heavy cues for proper performance 2 x 10 ea    NMR: To facilitate reeducation of movement, balance, posture, coordination, and/or proprioception/kinesthetic sense.  - standing blaze pod taps 2 x 1 min on ea LE ( 1 LE tapping pods randomly on the ipsilateral side of moving LE  TE- To improve strength,  endurance, mobility, and function of specific targeted muscle groups or improve joint range of motion or improve  muscle flexibility  - seated LAQ 2 x 10 ea LE   Unless otherwise stated, CGA was provided and gait belt donned in order to ensure pt safety    PATIENT EDUCATION: Education details: PT POC, findings on assessment today, recommendation to continue using RW for all functional mobility Person educated: Patient and Caregiver [name] Education method: Explanation Education comprehension: verbalized understanding and needs further education  HOME EXERCISE PROGRAM: Access Code: [email or web address] URL: [email or web address] Date: 02/04/2024 Prepared by: Peggye Linear  Exercises - Standing March with Counter Support  - 2 x daily - 7 x weekly - 2 sets - 20 reps - Side stepping with counter support: yellow band loop  - 2 x daily - 7 x weekly - 2 sets - 20 reps - Sit to Stand with Counter Support  - 2 x daily - 7 x weekly - 2 sets - 12 reps   GOALS: Goals reviewed with patient? Yes  SHORT TERM GOALS: Target date: 03/10/2024  Pt will be independent with HEP in order to improve strength and balance in order to decrease fall risk and improve function at home and work.  Baseline: need to initiate Goal status: INITIAL  LONG TERM GOALS: Target date: 04/21/2024  1.  Patient will complete five times sit to stand test in < 15 seconds indicating an increased LE strength and improved balance. Baseline:  38.75 seconds, using UE support  Goal status: INITIAL  2.  Patient will increase ABC scale score >80% to demonstrate better functional mobility and better confidence with ADLs.   Baseline: 7.5% Goal status: INITIAL   3.  Patient will increase Berg Balance score by > 6 points to demonstrate decreased fall risk during functional activities. Baseline: need to assess; 01/30/2024= 34/56 Goal status: INITIAL   4. Patient will increase 10 meter walk test to >1.6m/s as to improve gait speed  for better community ambulation and to reduce fall risk. Baseline: 0.48 m/s using youth RW, requires CGA for safety  Goal status: INITIAL  5. Patient will increase six minute walk test distance to >1000 for progression to community ambulator and improve gait ability Baseline: need to assess; 01/30/2024=410 feet using RW; 02/13/24: 448ft in 50m30s Goal status: MET   ASSESSMENT:  CLINICAL IMPRESSION:  Patient performed well overall- able to respond to VC for picking feet up with gait and balance activities.Pt challenged with dynamic sidestepping without UE support, pt slow and relatively unsteady  but improves with practice. Pt also demonstrated difficulty with transfers and on multiple occasion was ready to sit on arm of armchair prior to PT cueing her regarding placement of chair.  Ms. Lupien will benefit from further skilled PT to improve these deficits in order to increase QOL, decrease fall risk, and ease/safety with ADLs.   OBJECTIVE IMPAIRMENTS: Abnormal gait, decreased activity tolerance, decreased balance, decreased endurance, decreased knowledge of use of DME, decreased mobility, difficulty walking, decreased strength, decreased safety awareness, impaired vision/preception, and pain.   ACTIVITY LIMITATIONS: carrying, lifting, bending, standing, squatting, stairs, transfers, bed mobility, bathing, toileting, dressing, reach over head, hygiene/grooming, and locomotion level  PARTICIPATION LIMITATIONS: meal prep, cleaning, laundry, driving, shopping, and community activity  PERSONAL FACTORS: Age, Time since onset of injury/illness/exacerbation, and 3+ comorbidities: Hypertension, B12 deficiency, type 2 diabetes, hyperlipidemia, CKD stage IIIa are also affecting patient's functional outcome.   REHAB POTENTIAL: Good  CLINICAL DECISION MAKING: Evolving/moderate complexity  EVALUATION COMPLEXITY: Moderate  PLAN:  PT FREQUENCY: 1-2x/week  PT DURATION: 12 weeks  PLANNED INTERVENTIONS:  02835- PT  Re-evaluation, 97750- Physical Performance Testing, 97110-Therapeutic exercises, 97530- Therapeutic activity, [id number]- Neuromuscular re-education, [phone]- Self Care, 02859- Manual therapy, [phone]- Gait training, [phone]- Orthotic Initial, [phone]- Orthotic/Prosthetic subsequent, [phone]- Canalith repositioning, [phone]- Electrical stimulation (manual), Patient/Family education, Balance training, Stair training, Taping, Joint mobilization, Spinal mobilization, Vestibular training, Visual/preceptual remediation/compensation, DME instructions, Cryotherapy, Moist heat, and Biofeedback  PLAN FOR NEXT SESSION:  - FGA when appropriate and add LTG - floor transfers training - continue HIIT - continue gait training without AD for balance and righting (include multi plane, multidirectional)  - dynamic reaching and dynamic stepping balance    3:42 PM, 02/25/24 Note: Portions of this document were prepared using Dragon voice recognition software and although reviewed may contain unintentional dictation errors in syntax, grammar, or spelling.  Lonni KATHEE Gainer PT ,DPT Physical Therapist- [place]  [facility]

## 2024-02-27 ENCOUNTER — Ambulatory Visit: Admitting: Speech Pathology

## 2024-02-27 ENCOUNTER — Ambulatory Visit: Admitting: Physical Therapy

## 2024-02-27 ENCOUNTER — Ambulatory Visit: Admitting: Occupational Therapy

## 2024-02-27 DIAGNOSIS — R41841 Cognitive communication deficit: Secondary | ICD-10-CM

## 2024-02-27 DIAGNOSIS — M6281 Muscle weakness (generalized): Secondary | ICD-10-CM

## 2024-02-27 DIAGNOSIS — I69351 Hemiplegia and hemiparesis following cerebral infarction affecting right dominant side: Secondary | ICD-10-CM

## 2024-02-27 DIAGNOSIS — R269 Unspecified abnormalities of gait and mobility: Secondary | ICD-10-CM

## 2024-02-27 DIAGNOSIS — R278 Other lack of coordination: Secondary | ICD-10-CM

## 2024-02-27 DIAGNOSIS — R2681 Unsteadiness on feet: Secondary | ICD-10-CM

## 2024-02-27 NOTE — Therapy (Unsigned)
 OUTPATIENT SPEECH LANGUAGE PATHOLOGY  COGNITIVE-COMMUNICATION TREATMENT   Patient Name: Haley Jones MRN: [id number] DOB:[birth year], 68 y.o., female Today's Date: 02/27/2024  PCP: Haley Oneil FALCON, MD  REFERRING PROVIDER: Pegge Toribio PARAS, PA-C    End of Session - 02/27/24 1638     Visit Number 9    Number of Visits 25    Date for SLP Re-Evaluation 04/27/24    Authorization Type [employer]    Authorization Time Period 01/28/2024 thru 04/27/2024    Authorization - Visit Number 9    Authorization - Number of Visits 24    Progress Note Due on Visit 10    SLP Start Time 1445    SLP Stop Time  1530    SLP Time Calculation (min) 45 min    Activity Tolerance Patient tolerated treatment well          Past Medical History:  Diagnosis Date   Adnexal mass    Anxiety    Chronic back pain    Depression    Diabetes mellitus without complication (HCC)    Endometriosis 03/12/2018   GERD (gastroesophageal reflux disease)    Heart murmur 02/2018   undetected until she was an adult. 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   Weight loss 12/06/2017   Past Surgical History:  Procedure Laterality Date   ABDOMINAL HYSTERECTOMY  2008   ovaries also   CESAREAN SECTION  1994   COLONOSCOPY     COLONOSCOPY WITH PROPOFOL  N/A 02/12/2018   Procedure: COLONOSCOPY WITH PROPOFOL ;  Surgeon: Toledo, Ladell POUR, MD;  Location: [facility] END[facility];  Service: Gastroenterology;  Laterality: N/A;   ESOPHAGOGASTRODUODENOSCOPY (EGD) WITH PROPOFOL  N/A 02/12/2018   Procedure: ESOPHAGOGASTRODUODENOSCOPY (EGD) WITH PROPOFOL ;  Surgeon: Toledo, Ladell POUR, MD;  Location: [facility];  Service: Gastroenterology;  Laterality: N/A;   EYE SURGERY Left 1973   muscle shortening to straighten cross eyes   LAPAROSCOPY N/A 02/20/2018   Procedure: LAPAROSCOPY DIAGNOSTIC, ( RESECTION OF RIGHT LOWER QUADRANT ABDOMINAL MASS);   Surgeon: Haley Selinda Birmingham, MD;  Location: [facility] ORS;  Service: General;  Laterality: N/A;   ROTATOR CUFF REPAIR Left 2011   Patient Active Problem List   Diagnosis Date Noted   Urinary incontinence as sequela of cerebrovascular accident (CVA) 02/11/2024   Depression with anxiety 01/04/2024   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   Acute CVA (cerebrovascular accident) (HCC) 12/20/2023   Primary hypertension 12/20/2023   Acute kidney injury superimposed on chronic kidney disease (HCC) 12/20/2023   DM type 2 with diabetic mixed hyperlipidemia (HCC) 11/17/2022   Major depressive disorder, recurrent, 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/17/2022   B12 deficiency 02/23/2021   SBO (small bowel obstruction) (HCC) 01/29/2019   Barrett's esophagus without dysplasia 04/15/2018   Weight loss 12/06/2017   Hoarse 12/06/2017   Lumbar disc disease 07/26/2017   Diabetes mellitus type 2, controlled, without complications (HCC) 02/09/2015   Surgical menopause 11/26/2014    ONSET DATE: 12/20/23   REFERRING DIAG: CVA  THERAPY DIAG:  Cognitive communication deficit  Rationale for Evaluation and Treatment Rehabilitation  SUBJECTIVE:   PERTINENT HISTORY: Haley Jones is a 68 year old left-handed female who [facility] to [facility]23 with right side weakness/falls and dizziness. MRI of the brain showed patchy acute cortical/subcortical infarcts within the left parietal occipital lobes individually measuring up to 3 cm (MCA vascular territory). Subtle petechial hemorrhage associated with some of these infarcts.  No frank hemorrhagic conversion.  Additional punctate left MCA territory cortical acute infarct within the left insula.  Background parenchymal atrophy and chronic small vessel ischemic disease with chronic lacunar infarcts.  CT angio showed severe stenosis/nonocclusive thrombus of the left MCA bifurcation affecting the inferior division.  Nonocclusive embolus also visible within the left parietal branch as  well. Echocardiogram ejection fraction of 60 to 65% no wall motion abnormalities grade 1 diastolic dysfunction. Past medical history also noted for hypertension, B12 deficiency, type 2 diabetes mellitus, hyperlipidemia, CKD stage III, depression.  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 D/c home after CIR admission 12/28/23-01/18/24. Sister lives nearby and checks on patient throughout the day, assists with meals, bathing, and medications.  DIAGNOSTIC FINDINGS: see above  PAIN:  Are you having pain? No   FALLS: Has patient fallen in last 6 months?  Yes, Number of falls: 1 since returning home from rehab, multiple prior to hospitalization  LIVING ENVIRONMENT: Lives with: lives alone Lives in: House/apartment  PLOF:  Level of assistance: Independent with IADLs Employment: Retired   PATIENT GOALS   Pt states she wants to drive again  SUBJECTIVE STATEMENT: Pt alert, cooperative. Talkative, ?hyperverbal. Pt accompanied by: self  OBJECTIVE:   TODAY'S TREATMENT: Skilled treatment session focused on pt's cognitive communication goals. SLP facilitated session by providing the following interventions:  To work towards functional independence, this Clinical research associate helped pt establish plan to organize her education into pill organizer with her sister double checking her x 2 then leaving medications on counter for visual reminder to take medications with her sister intermittently looking at them.   Also provided instruction provided on pt paying bills with sister's supervision to ensure correct amounts and information are entered for transition to independence.   Pt to explain this information to her sister after session.    PATIENT EDUCATION: Education details: as above Person educated: Patient Education method: Verbal cues Education comprehension: verbalized understanding and needs further education   HOME EXERCISE  PROGRAM:   See above  GOALS:  Goals reviewed with patient? Yes  SHORT TERM GOALS: Target date: 10 sessions  The patient will sustain attention in simple cognitive linguistic task for 5 minutes given min verbal cues.  Baseline: Goal status: INITIAL  2.  Patient will reduce impulsivity by preplanning steps before completing a task with moderate verbal cues. Baseline:  Goal status: INITIAL  3.  Patient will complete Patient Reported Outcome Measure to assess self-perception of deficits.  Baseline:  Goal status: INITIAL   LONG TERM GOALS: Target date: 04/27/2024  Patient will alternate attention between two moderately complex tasks 80% acc, with min verbal cues.  Baseline:  Goal status: INITIAL  2.  Patient will demonstrate emergent awareness by identifying 75% of errors with min cues for double checking. Baseline:  Goal status: INITIAL  3.  Patient will recall safety precautions/mobility recommendations using visual aids if necessary to reduce risk for falls. Baseline:  Goal status: INITIAL   ASSESSMENT:  CLINICAL IMPRESSION: Based on recent assessment, Dezirea Mccollister presents with overall moderate cognitive communication impairment per standardized testing using the Cognitive Linguistic Quick Test. Primary deficit areas (severe) include attention, executive function, and visuospatial skills. Patient's impulsivity contributed significantly to testing errors, as did poor sustained attention to testing instructions. Impulsivity impacting safety at home, contributing to fall at home. Patient and sister report reduced frustration tolerance. She is often frustrated when being given instructions. Patient demonstrated  some emergent awareness of errors on tasks such as mazes, but was unaware of perseverations in design generation and generative naming, and errors in clock drawing, symbol cancellation, and symbol trails tasks.   Pt with eager response and voiced understanding to the above  instruction within the treatment note. Pt's sister was present when taking her back to the lobby. Pt proceeded to tell her sister about the above recommendations. SLP aided in explanation where necessary.   OBJECTIVE IMPAIRMENTS include attention, memory, awareness, and executive functioning. These impairments are limiting patient from managing medications, managing appointments, managing finances, household responsibilities, ADLs/IADLs, and effectively communicating at home and in community. Factors affecting potential to achieve goals and functional outcome are ability to learn/carryover information and severity of impairments. Patient will benefit from skilled SLP services to address above impairments and improve overall function.  REHAB POTENTIAL: Good  PLAN: SLP FREQUENCY: 1-2x/week  SLP DURATION: 12 weeks  PLANNED INTERVENTIONS: Environmental controls, Cueing hierachy, Cognitive reorganization, Internal/external aids, Functional tasks, SLP instruction and feedback, Compensatory strategies, Patient/family education, and [id number] Treatment of speech (30 or 45 min)    Darrek Leasure B. Rubbie, M.S., CCC-SLP, Tree surgeon Certified Brain Injury Specialist [facility]  [facility] Rehabilitation Services Office [phone] Ascom [phone] Fax [phone]

## 2024-02-27 NOTE — Therapy (Unsigned)
 OUTPATIENT OCCUPATIONAL THERAPY NEURO TREATMENT NOTE  Patient Name: Haley Jones MRN: [id number] DOB:1956/03/17, 68 y.o., female Today's Date: 02/28/2024  PCP: Cleotilde Oneil FALCON, MD REFERRING PROVIDER: Pegge Toribio PARAS, PA-C   OT End of Session - 02/28/24 1007     Visit Number 9    Number of Visits 24    Date for OT Re-Evaluation 04/22/24    OT Start Time 1400    OT Stop Time 1445    OT Time Calculation (min) 45 min    Activity Tolerance Patient tolerated treatment well    Behavior During Therapy [facility] for tasks assessed/performed            Past Medical History:  Diagnosis Date   Adnexal mass    Anxiety    Chronic back pain    Depression    Diabetes mellitus without complication (HCC)    Endometriosis 03/12/2018   GERD (gastroesophageal reflux disease)    Heart murmur 02/2018   undetected until she was an adult. 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   Weight loss 12/06/2017   Past Surgical History:  Procedure Laterality Date   ABDOMINAL HYSTERECTOMY  2008   ovaries also   CESAREAN SECTION  1994   COLONOSCOPY     COLONOSCOPY WITH PROPOFOL  N/A 02/12/2018   Procedure: COLONOSCOPY WITH PROPOFOL ;  Surgeon: Toledo, Ladell POUR, MD;  Location: [facility] ENDOSCOPY;  Service: Gastroenterology;  Laterality: N/A;   ESOPHAGOGASTRODUODENOSCOPY (EGD) WITH PROPOFOL  N/A 02/12/2018   Procedure: ESOPHAGOGASTRODUODENOSCOPY (EGD) WITH PROPOFOL ;  Surgeon: Toledo, Ladell POUR, MD;  Location: [facility] ENDOSCOPY;  Service: Gastroenterology;  Laterality: N/A;   EYE SURGERY Left 1973   muscle shortening to straighten cross eyes   LAPAROSCOPY N/A 02/20/2018   Procedure: LAPAROSCOPY DIAGNOSTIC, ( RESECTION OF RIGHT LOWER QUADRANT ABDOMINAL MASS);  Surgeon: Nicholaus Selinda Birmingham, MD;  Location: [facility] ORS;  Service: General;  Laterality: N/A;   ROTATOR CUFF REPAIR Left 2011   Patient Active Problem List   Diagnosis  Date Noted   Depression with anxiety 01/04/2024   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   Acute CVA (cerebrovascular accident) (HCC) 12/20/2023   Primary hypertension 12/20/2023   Acute kidney injury superimposed on chronic kidney disease (HCC) 12/20/2023   DM type 2 with diabetic mixed hyperlipidemia (HCC) 11/17/2022   Major depressive disorder, recurrent, 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  Rationale for Evaluation and Treatment: Rehabilitation  SUBJECTIVE:   SUBJECTIVE STATEMENT: Pt. Reports that things have been going better at home with manipulating small objects and change.  Pt accompanied by: self and family member  PERTINENT HISTORY: Pt. Is a 68 yo female with hx of a L MCA CVA. Pt. Was admitted into ED on 12/20/2023 with an onset of RUE weakness, numbness dysmetria, and visual deficits. Upon assessment, it was concluded that Pt. Experienced multiple infarcts in the L Parietal/Occipital region. PMHx: Dizziness, Falls  PRECAUTIONS: None  WEIGHT BEARING RESTRICTIONS: No  PAIN:  Are you having pain? No  FALLS: Has patient fallen  in last 6 months? Yes. Number of falls 10  LIVING ENVIRONMENT: Lives with: lives with their family and lives alone Lives in: House/apartment Stairs: No, Ramp Has following equipment at home: Walker - 2 wheeled, Shower bench, and bed side commode  PLOF: Independent  PATIENT GOALS: To be able to feed herself and hold a fork and knife better.   OBJECTIVE:  Note: Objective measures were completed at Evaluation unless otherwise noted.  HAND  DOMINANCE: Left  ADLs: Overall ADLs:  Transfers/ambulation related to ADLs:  Eating: Independent  Grooming: independent UB Dressing: Pt. Has difficulty managing buttons LB Dressing: jean zippers are difficult to manipulate, hiking pants, and clothing negotiation are difficult. Toileting: Independent Bathing: Independent Tub Shower transfers: stepping into shower is difficult.  Equipment: Grab bars, shower bench, bedside commode  IADLs: Shopping: Does not currently do that/has not tried Light housekeeping: Does not currently do that/has not tried Meal Prep: Has not cooked in months, did not prior to CVA, sister provides meals. Community mobility: Relies on family, and friends Medication management: Assistance from sister to initiate medication management, she gives herself a shot for diabetes Financial management: Sister is assisting with monthly bill management Handwriting: Cursive form is 10% legible; Printed form is 90% legible  MOBILITY STATUS: Needs Assist: CGA and Hx of falls  POSTURE COMMENTS:  No Significant postural limitations Sitting balance: Good  ACTIVITY TOLERANCE: Activity tolerance: Good  FUNCTIONAL OUTCOME MEASURES: MAM-20: TBD  UPPER EXTREMITY ROM:    Active ROM Right eval Left Eval [facility]  Shoulder flexion 50(58)   Shoulder abduction 43(48)   Shoulder adduction    Shoulder extension    Shoulder internal rotation    Shoulder external rotation    Elbow flexion 118(131)   Elbow extension -24(-22)   Wrist flexion 20 (21)   Wrist extension 30(31)   Wrist ulnar deviation    Wrist radial deviation    Wrist pronation    Wrist supination    (Blank rows = not tested)  UPPER EXTREMITY MMT:     MMT Right eval Left eval  Shoulder flexion 2/5 WFL  Shoulder abduction 2/5 [facility]  Shoulder adduction    Shoulder extension    Shoulder internal rotation    Shoulder external rotation    Middle trapezius    Lower trapezius    Elbow flexion 4-/5 WFL  Elbow  extension 4-/5 WFL  Wrist flexion 2+/5 WFL  Wrist extension 3-/5 WFL  Wrist ulnar deviation    Wrist radial deviation    Wrist pronation    Wrist supination    (Blank rows = not tested)  HAND FUNCTION: Grip strength: Right: 21 lbs; Left: 14 lbs, Lateral pinch: Right: 7 lbs, Left: 4 lbs, and 3 point pinch: Right: 4 lbs, Left: 5 lbs  COORDINATION: 9 Hole Peg test: Right: 72 sec; Left: 40 sec  SENSATION: Light touch: Impaired  Proprioception: Impaired   EDEMA: none  MUSCLE TONE: RUE: Mild  COGNITION: Overall cognitive status: Impaired  VISION: Subjective report: Pt. Has not noticed the changes in vision but her sister has. Caregiver reports that Pt. Does not bring much attention to the R side.  Baseline vision:  Visual history:   VISION ASSESSMENT: TBD   PERCEPTION: TBD  PRAXIS: Impaired: Motor planning  TREATMENT DATE: 02/27/2024   Neuromuscular Re-education:  -Pt. Performed RUE [facility] skills manipulating 1/2 Lite Brite pegs, storing and moving pegs from the palm to the thumb and 2nd digit to perform translatory movements of the R hand in preparation of placing 1/2 lite Brite pegs onto a  mini resistive board positioned in a vertical angle.  -Pt worked on [facility] skills grasping Lite Brite pegs with R hand to remove them from the  mini resistive pegboard, placing pegs into container while promoting external rotation.  Manual Therapy:  -Pt. tolerated soft tissue massage to the right bicep to decrease stiffness prior to ROM.  Manual therapy was performed independent of, and in preparation for ROM, and therapeutic ex.   -Facilitated RUE shoulder flexion/ abduction AAROM/PROM in preparation for functional reaching tasks.   Therapeutic Activities:  -Pt. Performed 2 trials of translatory movements using the R hand, storing 2 pegs at a time, in preparation for  placing pegs into jumbo pegboard.  -Pt. Performed 2 trials of RUE Progressive gross grip strengthening with 11.2# of grip strength resistive force, sustaining gross grip in combination with functional reaching. Pt. Used gross gripper to place jumbo pegs into  a container  placed in low level planes.  -Pt. Performed 2 trials of RUE Lateral, and 3pt. Pinch strengthening using yellow, red, and green level resistive clips in combination with reaching to place them onto a horizontal dowel positioned in a  frontal plane.      PATIENT EDUCATION: Education details: Grip/Pinch strength, POC, ROM and BUE strength, BUE motor control  Person educated: Patient and Sister Education method: Explanation, Demonstration, Tactile cues, and Verbal cues Education comprehension: verbalized understanding and returned demonstration  HOME EXERCISE PROGRAM: Yellow theraputty HEP   GOALS: Goals reviewed with patient? Yes  SHORT TERM GOALS: Target date: 03/10/2024    Pt. Will be independent utilizing HEPs for hand strengthening exercises.  Baseline: Eval: No current HEP program Goal status: INITIAL   LONG TERM GOALS: Target date: 04/21/2024   Pt. Will improve BUE grip strength by 5# of force to be able to securely hold items. Baseline: Eval: R grip strength: 21#, L grip strength: 14# Goal status: INITIAL  2.  Pt. Will improve RUE [facility] skills by 3 sec. to be able to manipulate zippers/buttons. Baseline: 9 hole peg test; R side: 72 secs, L side: 40 secs Goal status: INITIAL  3.  Pt. Will increase R shoulder flex/shoulder ABD ROM by 10 degrees to perform ADLs/IADLs.  Baseline: R shoulder flex: 50(58), R shoulder ABD: 43(48) Goal status: INITIAL  4.  Pt. Will improve handwriting to 50% legible in cursive form, legibility to be able to send written correspondence Baseline: 10% legible in cursive form, 90% legible in printed form. Goal status: INITIAL  5.  Pt. Will improve BUE pinch strength by 2# of force  to  be able to assist with hiking pants Baseline: R Lateral Pinch: 7, R 3 pt. Pinch: 4, L Lateral Pinch: 4, L 3 pt. Pinch: 5 Goal status: INITIAL  6.  Pt. Will increase R wrist extension/flexion by 5 degrees in anticipation of reaching hold of items for ADLs. Baseline: R Wrist ext: 20(21), R Wrist flex: 30(31) Goal status: INITIAL  ASSESSMENT:  CLINICAL IMPRESSION:  Pt. tolerated treatment well today. Pt. Presents with fatigue in the RUE  during functional reaching skills as well as [facility] tasks today. Pt. Has difficulty with sustaining grip while using gross gripper combined with  functional reaching tasks. Pt. Was able to  sustain grasp to place pegs into container in low level planes. Pt. Has difficulty manipulating pegs while removing pegs from container. Pt. Presents with increased tightness during functional reaching, requiring a break to receive soft tissue massage to the right bicep to decrease tightness. Pt. Has made improvements with performing translatory movements in the hand, moving 1 Lite Brite peg at a time. Pt. Has difficulty storing 2 Lite Brite pegs while sustaining grasp in wrist/elbow extension to place pegs into mini pegboard. Pt. Has limited shoulder ROM during AAROM, however, during PROM, pt. Is able to reach approximately 95 degrees of shoulder flexion. Pt. continues to benefit from OT services to work on RUE ROM, RUE strengthening, bilateral grip strength, bilateral pinch strength, bilateral [facility] skills, and Right sided awareness and attention in order to improve functional independence of ADL/IADL tasks at home.   PERFORMANCE DEFICITS: in functional skills including ADLs, IADLs, coordination, dexterity, proprioception, sensation, tone, ROM, strength, pain, Fine motor control, Gross motor control, endurance, and UE functional use, and psychosocial skills including environmental adaptation, habits, interpersonal interactions, and routines and behaviors.   IMPAIRMENTS: are limiting  patient from ADLs, IADLs, rest and sleep, work, leisure, and social participation.   CO-MORBIDITIES: may have co-morbidities  that affects occupational performance. Patient will benefit from skilled OT to address above impairments and improve overall function.  MODIFICATION OR ASSISTANCE TO COMPLETE EVALUATION: Min-Moderate modification of tasks or assist with assess necessary to complete an evaluation.  OT OCCUPATIONAL PROFILE AND HISTORY: Detailed assessment: Review of records and additional review of physical, cognitive, psychosocial history related to current functional performance.  CLINICAL DECISION MAKING: Moderate - several treatment options, min-mod task modification necessary  REHAB POTENTIAL: Good  EVALUATION COMPLEXITY: Moderate    PLAN:  OT FREQUENCY: 2x/week  OT DURATION: 12 weeks  PLANNED INTERVENTIONS: 97535 self care/ADL training, 02889 therapeutic exercise, 97530 therapeutic activity, 97112 neuromuscular re-education, 97018 paraffin, 02989 moist heat, 97010 cryotherapy, 97034 contrast bath, 97032 electrical stimulation (manual), passive range of motion, visual/perceptual remediation/compensation, energy conservation, patient/family education, and DME and/or AE instructions  RECOMMENDED OTHER SERVICES: ST & PT  CONSULTED AND AGREED WITH PLAN OF CARE: Patient and family member/caregiver  PLAN FOR NEXT SESSION: treatment  Damien Nap, OTS  This entire session was performed under direct supervision and direction of a licensed therapist/therapist assistant . I have personally read, edited and approve of the note as written.   Richardson Otter, MS, OTR/L   02/28/2024, 10:10 AM

## 2024-02-27 NOTE — Therapy (Unsigned)
 OUTPATIENT PHYSICAL THERAPY PROGRESS NOTE   Patient Name: Haley Jones MRN: [id number] DOB:October 23, 1955, 68 y.o., female Today's Date: 02/27/2024  PCP: Dr. Oneil Pinal REFERRING PROVIDER: Toribio Pitch, PA-C  END OF SESSION:   PT End of Session - 02/27/24 1318     Visit Number 10    Number of Visits 24    Date for PT Re-Evaluation 04/21/24    Progress Note Due on Visit 10    PT Start Time 1315    PT Stop Time 1400    PT Time Calculation (min) 45 min    Equipment Utilized During Treatment Gait belt    Activity Tolerance Patient tolerated treatment well    Behavior During Therapy WFL for tasks assessed/performed            Past Medical History:  Diagnosis Date   Adnexal mass    Anxiety    Chronic back pain    Depression    Diabetes mellitus without complication (HCC)    Endometriosis 03/12/2018   GERD (gastroesophageal reflux disease)    Heart murmur 02/2018   undetected until she was an adult. 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   Weight loss 12/06/2017   Past Surgical History:  Procedure Laterality Date   ABDOMINAL HYSTERECTOMY  2008   ovaries also   CESAREAN SECTION  1994   COLONOSCOPY     COLONOSCOPY WITH PROPOFOL  N/A 02/12/2018   Procedure: COLONOSCOPY WITH PROPOFOL ;  Surgeon: Toledo, Ladell POUR, MD;  Location: [facility];  Service: Gastroenterology;  Laterality: N/A;   ESOPHAGOGASTRODUODENOSCOPY (EGD) WITH PROPOFOL  N/A 02/12/2018   Procedure: ESOPHAGOGASTRODUODENOSCOPY (EGD) WITH PROPOFOL ;  Surgeon: Toledo, Ladell POUR, MD;  Location: [facility];  Service: Gastroenterology;  Laterality: N/A;   EYE SURGERY Left 1973   muscle shortening to straighten cross eyes   LAPAROSCOPY N/A 02/20/2018   Procedure: LAPAROSCOPY DIAGNOSTIC, ( RESECTION OF RIGHT LOWER QUADRANT ABDOMINAL MASS);  Surgeon: Nicholaus Selinda Birmingham, MD;  Location: [facility];  Service: General;   Laterality: N/A;   ROTATOR CUFF REPAIR Left 2011   Patient Active Problem List   Diagnosis Date Noted   Urinary incontinence as sequela of cerebrovascular accident (CVA) 02/11/2024   Depression with anxiety 01/04/2024   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   Acute CVA (cerebrovascular accident) (HCC) 12/20/2023   Primary hypertension 12/20/2023   Acute kidney injury superimposed on chronic kidney disease (HCC) 12/20/2023   DM type 2 with diabetic mixed hyperlipidemia (HCC) 11/17/2022   Major depressive disorder, recurrent, 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  Rationale for Evaluation and Treatment: Rehabilitation  SUBJECTIVE:  SUBJECTIVE STATEMENT:  Patient reports she is doing okay today. No changes to report.  PERTINENT HISTORY:Pt states she is not sure the exact date of when her CVA happened because she kept falling at home trying to care for herself and her daughter, but she finally went to the hospital when her sister insisted on it. Pt states her special needs daughter, [name], passed away recently while pt was in the hospital at [facility] before she was transferred to [facility] for stroke rehabilitation.Pt's sister states pt requires supervision/cuing for ADLs  to don clothes correctly, with correct orientation (potential apraxia?)  Pt/sister report pt has R side inattention.Pt reports supposedly one of her legs is shorter than the other, believes her R LE might be the shorter one. 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     PAIN:  Are you having pain? No  PRECAUTIONS: Fall RED FLAGS: None  WEIGHT BEARING RESTRICTIONS: No FALLS: Has patient fallen in last 6 months? Yes. Number of falls 6+ before going to hospital and 1 fall since being home from CIR - pt states she was trying to gather her clothes by herself rather than waiting for her sister to get there to help her  LIVING ENVIRONMENT Lives with: lives alone and but pt's sister, [name], comes over daily to assist with ADLs and stays to provide supervision all day - pt states at night she would be able to walk to bathroom using RW if needed (but she wears depends in event of incontinence) Lives in: House/apartment Stairs: she has steps, but she doesn't have to use them (house was wheelchair accessible for her daughter)  Has following equipment at home: Environmental consultant - 2 wheeled, shower chair, Grab bars, Ramped entry, and transport chair  PLOF: Independent, Independent with household mobility without device, Independent with homemaking with ambulation, Independent with gait, Independent with transfers, and she was the primary caregiver for her recently deceased daughter   CLOF: Sister provides supervision daily for pt to ambulate using RW safely and provides assist for ADLs (showering and dressing - progressing towards supervision with these activities); Sister states pt requires cues to turn and step back fully prior to sitting to ensure her safety  PATIENT GOALS: get back to being able to go shopping with improved community level ambulation without the walker; return to driving   OBJECTIVE:  Note: Objective measures were completed at Evaluation unless otherwise noted.  DIAGNOSTIC FINDINGS:   EXAM: MRI HEAD WITHOUT  CONTRAST   TECHNIQUE: Multiplanar, multiecho pulse sequences of the brain and surrounding structures were obtained without intravenous contrast.   COMPARISON:  Brain MRI 07/31/2022.  IMPRESSION: 1. Patchy acute cortical/subcortical infarcts within the left parietal and occipital lobes individually measuring up to 3 cm (MCA vascular territory). Subtle petechial hemorrhage associated with some of these infarcts. No frank hemorrhagic conversion. 2. Additional punctate left MCA territory cortical acute infarct within the left insula. 3. Background parenchymal atrophy and chronic small vessel ischemic disease with chronic lacunar infarcts, as described. 4. Paranasal sinus disease as outlined (including severe right maxillary sinusitis).   Electronically Signed   By: Rockey Childs D.O.   On: 12/20/2023 11:52  COGNITION: Overall cognitive status: Within functional limits for tasks assessed   SENSATION: Light touch: initially misses the first 1-2 touches on R LE, but then able to feel remaining touches Proprioception: Impaired grossly  COORDINATION: Symmetrical heel-to-shin bilaterally  EDEMA:  Not formally assessed, but none  observed  MUSCLE TONE: Not formally assessed  POSTURE: rounded shoulders, forward head, and posterior pelvic tilt  LOWER EXTREMITY MMT:    MMT Right Eval Left Eval  Hip flexion 3+, no pain 3+ with some L hip and back pain with this  Hip extension    Hip abduction    Hip adduction    Hip internal rotation    Hip external rotation    Knee flexion 3+ 4  Knee extension 4- 4  Ankle dorsiflexion 3+ 4  Ankle plantarflexion 4- 4  Ankle inversion    Ankle eversion    (Blank rows = not tested)  BED MOBILITY:  Findings: Sit to supine need to assess Supine to sit need to assess *pt reports some difficulty with bed mobility  TRANSFERS: Sit to stand: SBA  Assistive device utilized: Environmental consultant - 2 wheeled     Stand to sit: SBA  Assistive device utilized:  Environmental consultant - 2 wheeled     Chair to chair: SBA  Assistive device utilized: Environmental consultant - 2 wheeled       GAIT: Findings: Gait Characteristics: slight R ankle instability noted, step through pattern, decreased stance time- Right, decreased stride length, and decreased ankle dorsiflexion- Right, Distance walked: ~170ft, Assistive device utilized:Walker - 2 wheeled, Level of assistance: SBA and CGA, and Comments:    FUNCTIONAL TESTS:  5 times sit to stand: 38.75 seconds, using UE support 10 meter walk test: 0.48 m/s using youth RW, requires CGA for safety 6 minute walk test: Visit 2, see note Berg Balance Scale: Visit 2, see note Functional gait assessment: need to assess, when appropriate  PATIENT SURVEYS:  ABC scale 7.5%                                                                                                                             TREATMENT DATE: 02/27/2024  Physical Performance    10 Meter Walk Test: Patient instructed to walk 10 meters (32.8 ft) as quickly and as safely as possible at their normal speed Results: 0.69 m/s (14.28sec)  Cut off scores:   Household Ambulator  < 0.4 m/s  Limited Community Ambulator  0.4 - 0.8 m/s  [employer]  > 0.8 m/s  Increased fall risk  < 1.53m/s  Crossing a Street  >1.40m/s  MCID 0.05 m/s (small), 0.13 m/s (moderate), 0.06 m/s (significant)  (ANPTA Core Set of Outcome Measures for Adults with Neurologic Conditions, 2018)     [facility] PT Assessment - 02/27/24 0001       Berg Balance Test   Sit to Stand Able to stand  independently using hands    Standing Unsupported Able to stand safely 2 minutes    Sitting with Back Unsupported but Feet Supported on Floor or Stool Able to sit safely and securely 2 minutes    Stand to Sit Controls descent by using hands    Transfers Able to transfer safely, definite need of hands  Standing Unsupported with Eyes Closed Able to stand 10 seconds safely    Standing Unsupported with Feet Together  Able to place feet together independently and stand 1 minute safely    From Standing, Reach Forward with Outstretched Arm Can reach forward >12 cm safely (5)    From Standing Position, Pick up Object from Floor Able to pick up shoe, needs supervision    From Standing Position, Turn to Look Behind Over each Shoulder Looks behind one side only/other side shows less weight shift    Turn 360 Degrees Able to turn 360 degrees safely but slowly    Standing Unsupported, Alternately Place Feet on Step/Stool Needs assistance to keep from falling or unable to try    Standing Unsupported, One Foot in Front Able to plae foot ahead of the other independently and hold 30 seconds    Standing on One Leg Unable to try or needs assist to prevent fall    Total Score 39           6 Min Walk Test:  Instructed patient to ambulate as quickly and as safely as possible for 6 minutes using LRAD. Patient was allowed to take standing rest breaks without stopping the test, but if the patient required a sitting rest break the clock would be stopped and the test would be over.  Results: 600 feet using a 2WW with SBA. Results indicate that the patient has reduced endurance with ambulation compared to age matched norms.  Age Matched Norms (in meters): 33-69 yo M: 3 F: 69, 70-79 yo M: 14 F: 471, 76-89 yo M: 417 F: 392 MDC: 58.21 meters (190.98 feet) or 50 meters (ANPTA Core Set of Outcome Measures for Adults with Neurologic Conditions, 2018)   Five times Sit to Stand Test (FTSS)  TIME: 20.35 sec  Cut off scores indicative of increased fall risk: >12 sec CVA, >16 sec PD, >13 sec vestibular (ANPTA Core Set of Outcome Measures for Adults with Neurologic Conditions, 2018)   ABC Scale   Unless otherwise stated, CGA was provided and gait belt donned in order to ensure pt safety    PATIENT EDUCATION: Education details: PT POC, findings on assessment today, recommendation to continue using RW for all functional  mobility Person educated: Patient and Caregiver [name] Education method: Explanation Education comprehension: verbalized understanding and needs further education  HOME EXERCISE PROGRAM: Access Code: [email or web address] URL: [email or web address] Date: 02/04/2024 Prepared by: Peggye Linear  Exercises - Standing March with Counter Support  - 2 x daily - 7 x weekly - 2 sets - 20 reps - Side stepping with counter support: yellow band loop  - 2 x daily - 7 x weekly - 2 sets - 20 reps - Sit to Stand with Counter Support  - 2 x daily - 7 x weekly - 2 sets - 12 reps   GOALS: Goals reviewed with patient? Yes  SHORT TERM GOALS: Target date: 03/10/2024  Pt will be independent with HEP in order to improve strength and balance in order to decrease fall risk and improve function at home and work.  Baseline: need to initiate Goal status:  ONGOING: 7/16: 2-3x week  LONG TERM GOALS: Target date: 04/21/2024  1.  Patient will complete five times sit to stand test in < 15 seconds indicating an increased LE strength and improved balance. Baseline:  38.75 seconds, using UE support, 7/16: 20.35 sec, UE support on 1st STS only  Goal status: 2.  Patient will increase ABC scale score >80%  to demonstrate better functional mobility and better confidence with ADLs.   Baseline: 7.5%, 7/16: 18.125% Goal status: INITIAL   3.  Patient will increase Berg Balance score by > 6 points to demonstrate decreased fall risk during functional activities. Baseline: need to assess; 01/30/2024= 34/56, 7/16= 39 Goal status: INITIAL   4. Patient will increase 10 meter walk test to >1.46m/s as to improve gait speed for better community ambulation and to reduce fall risk. Baseline: 0.48 m/s using youth RW, requires CGA for safety, 7/16: 0.69 m/s  Goal status: INITIAL  5. Patient will increase six minute walk test distance to >1000 for progression to community ambulator and improve gait ability Baseline: need to assess;  01/30/2024=410 feet using RW; 02/13/24: 493ft in 24m30s, 7/16: 666ft using 2WW Goal status: ONGOING   ASSESSMENT:  CLINICAL IMPRESSION:  Pt reports to PT today for progress note. Pt demo's good progress in regards to strength, endurance, and balance AEB increases in all of her functional testing. Pt does exhibit lack of confidence and inability to maintain SLS even for a brief period of time. Pt did exhibit LOB laterally to her L side while standing unsupported in normal stance inbetween tests during the berg. Pt was able to use mat table and PT assist to prevent a fall. Pt will continue to benefit from skilled physical therapy intervention to address impairments, improve QOL, and attain therapy goals.    OBJECTIVE IMPAIRMENTS: Abnormal gait, decreased activity tolerance, decreased balance, decreased endurance, decreased knowledge of use of DME, decreased mobility, difficulty walking, decreased strength, decreased safety awareness, impaired vision/preception, and pain.   ACTIVITY LIMITATIONS: carrying, lifting, bending, standing, squatting, stairs, transfers, bed mobility, bathing, toileting, dressing, reach over head, hygiene/grooming, and locomotion level  PARTICIPATION LIMITATIONS: meal prep, cleaning, laundry, driving, shopping, and community activity  PERSONAL FACTORS: Age, Time since onset of injury/illness/exacerbation, and 3+ comorbidities: Hypertension, B12 deficiency, type 2 diabetes, hyperlipidemia, CKD stage IIIa are also affecting patient's functional outcome.   REHAB POTENTIAL: Good  CLINICAL DECISION MAKING: Evolving/moderate complexity  EVALUATION COMPLEXITY: Moderate  PLAN:  PT FREQUENCY: 1-2x/week  PT DURATION: 12 weeks  PLANNED INTERVENTIONS: [id number]- PT Re-evaluation, 97750- Physical Performance Testing, 97110-Therapeutic exercises, 97530- Therapeutic activity, [id number]- Neuromuscular re-education, 97535- Self Care, [id number]- Manual therapy, [id number]- Gait training, [phone]- Orthotic  Initial, [phone]- Orthotic/Prosthetic subsequent, [phone]- Canalith repositioning, [phone]- Electrical stimulation (manual), Patient/Family education, Balance training, Stair training, Taping, Joint mobilization, Spinal mobilization, Vestibular training, Visual/preceptual remediation/compensation, DME instructions, Cryotherapy, Moist heat, and Biofeedback  PLAN FOR NEXT SESSION:  - FGA when appropriate and add LTG - floor transfers training - continue HIIT - continue gait training without AD for balance and righting (include multi plane, multidirectional)  - dynamic reaching and dynamic stepping balance    2:24 PM, 02/27/24 Note: Portions of this document were prepared using Dragon voice recognition software and although reviewed may contain unintentional dictation errors in syntax, grammar, or spelling.  Lonni KATHEE Gainer PT ,DPT Physical Therapist- [place]  [facility]

## 2024-03-03 ENCOUNTER — Encounter: Attending: Registered Nurse | Admitting: Physical Medicine and Rehabilitation

## 2024-03-03 ENCOUNTER — Encounter: Payer: Self-pay | Admitting: Physical Therapy

## 2024-03-03 ENCOUNTER — Ambulatory Visit: Admitting: Occupational Therapy

## 2024-03-03 ENCOUNTER — Ambulatory Visit: Admitting: Physical Therapy

## 2024-03-03 DIAGNOSIS — I63512 Cerebral infarction due to unspecified occlusion or stenosis of left middle cerebral artery: Secondary | ICD-10-CM | POA: Insufficient documentation

## 2024-03-03 DIAGNOSIS — R278 Other lack of coordination: Secondary | ICD-10-CM

## 2024-03-03 DIAGNOSIS — I10 Essential (primary) hypertension: Secondary | ICD-10-CM | POA: Insufficient documentation

## 2024-03-03 DIAGNOSIS — R41841 Cognitive communication deficit: Secondary | ICD-10-CM | POA: Diagnosis not present

## 2024-03-03 DIAGNOSIS — E7849 Other hyperlipidemia: Secondary | ICD-10-CM | POA: Insufficient documentation

## 2024-03-03 DIAGNOSIS — M6281 Muscle weakness (generalized): Secondary | ICD-10-CM

## 2024-03-03 DIAGNOSIS — I69351 Hemiplegia and hemiparesis following cerebral infarction affecting right dominant side: Secondary | ICD-10-CM

## 2024-03-03 DIAGNOSIS — R2681 Unsteadiness on feet: Secondary | ICD-10-CM

## 2024-03-03 DIAGNOSIS — R269 Unspecified abnormalities of gait and mobility: Secondary | ICD-10-CM

## 2024-03-03 NOTE — Therapy (Unsigned)
 OUTPATIENT PHYSICAL THERAPY TREATMENT      Patient Name: H[facility] MRN: [id number] DOB:Dec 12, 1955, 68 y.o., female Today's Date: 03/03/2024  PCP: [facility] REFERRING PROVIDER: Toribio Pitch, PA-C  END OF SESSION:   PT End of Session - 03/03/24 1408     Visit Number 11    Number of Visits 24    Date for PT Re-Evaluation 04/21/24    Progress Note Due on Visit 20    PT Start Time 1400    PT Stop Time 1443    PT Time Calculation (min) 43 min    Equipment Utilized During Treatment Gait belt    Activity Tolerance Patient tolerated treatment well    Behavior During Therapy WFL for tasks assessed/performed            Past Medical History:  Diagnosis Date   Adnexal mass    Anxiety    Chronic back pain    Depression    Diabetes mellitus without complication (HCC)    Endometriosis 03/12/2018   GERD (gastroesophageal reflux disease)    Heart murmur 02/2018   undetected until she was an adult. 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   Weight loss 12/06/2017   Past Surgical History:  Procedure Laterality Date   ABDOMINAL HYSTERECTOMY  2008   ovaries also   CESAREAN SECTION  1994   COLONOSCOPY     COLONOSCOPY WITH PROPOFOL  N/A 02/12/2018   Procedure: COLONOSCOPY WITH PROPOFOL ;  Surgeon: Toledo, Ladell POUR, MD;  Location: [facility];  Service: Gastroenterology;  Laterality: N/A;   ESOPHAGOGASTRODUODENOSCOPY (EGD) WITH PROPOFOL  N/A 02/12/2018   Procedure: ESOPHAGOGASTRODUODENOSCOPY (EGD) WITH PROPOFOL ;  Surgeon: Toledo, Ladell POUR, MD;  Location: [facility];  Service: Gastroenterology;  Laterality: N/A;   EYE SURGERY Left 1973   muscle shortening to straighten cross eyes   LAPAROSCOPY N/A 02/20/2018   Procedure: LAPAROSCOPY DIAGNOSTIC, ( RESECTION OF RIGHT LOWER QUADRANT ABDOMINAL MASS);  Surgeon: Haley Selinda Birmingham, MD;  Location: [facility];  Service: General;   Laterality: N/A;   ROTATOR CUFF REPAIR Left 2011   Patient Active Problem List   Diagnosis Date Noted   Urinary incontinence as sequela of cerebrovascular accident (CVA) 02/11/2024   Depression with anxiety 01/04/2024   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   Acute CVA (cerebrovascular accident) (HCC) 12/20/2023   Primary hypertension 12/20/2023   Acute kidney injury superimposed on chronic kidney disease (HCC) 12/20/2023   DM type 2 with diabetic mixed hyperlipidemia (HCC) 11/17/2022   Major depressive disorder, recurrent, 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/17/2022   B12 deficiency 02/23/2021   SBO (small bowel obstruction) (HCC) 01/29/2019   Barrett's esophagus without dysplasia 04/15/2018   Weight loss 12/06/2017   Hoarse 12/06/2017   Lumbar disc disease 07/26/2017   Diabetes mellitus type 2, controlled, without complications (HCC) 02/09/2015   Surgical menopause 11/26/2014    ONSET DATE: 12/20/2023  REFERRING DIAG: P36.487 (ICD-10-CM) - Left middle cerebral artery stroke (HCC)   THERAPY DIAG:  Muscle weakness (generalized)  Abnormality of gait  Hemiplegia and hemiparesis following cerebral infarction affecting right dominant side (HCC)  Unsteadiness on feet  Rationale for Evaluation and Treatment: Rehabilitation  SUBJECTIVE:  SUBJECTIVE STATEMENT:  Patient reports she is doing okay today. No changes to report.  PERTINENT HISTORY:Pt states she is not sure the exact date of when her CVA happened because she kept falling at home trying to care for herself and her daughter, but she finally went to [facility] when her sister insisted on it. Pt states her special needs daughter, [facility], passed away recently while [facility] in [facility] before [facility] was [facility] stroke  rehabilitation.Pt's sister states pt requires supervision/cuing for ADLs  to don clothes correctly, with correct orientation (potential apraxia?) Pt/sister report pt has R side inattention.Pt reports supposedly one of her legs is shorter than the other, believes her R LE might be the shorter one. 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     PAIN:  Are you having pain? No  PRECAUTIONS: Fall RED FLAGS: None  WEIGHT BEARING RESTRICTIONS: No FALLS: Has patient fallen in last 6 months? Yes. Number of falls 6+ before going to [facility] and 1 fall since being home from [facility] - pt states she was trying to gather her clothes by herself rather than waiting for her sister to get there to help her  LIVING ENVIRONMENT Lives with: lives alone and but pt's sister, [facility], comes over daily to assist with ADLs and stays to provide supervision all day - pt states at night she would be able to walk to bathroom using RW if needed (but she wears depends in event of incontinence) Lives in: House/apartment Stairs: she has steps, but she doesn't have to use them (house was wheelchair accessible for her daughter)  Has following equipment at home: Environmental consultant - 2 wheeled, shower chair, Grab bars, Ramped entry, and transport chair  PLOF: Independent, Independent with household mobility without device, Independent with homemaking with ambulation, Independent with gait, Independent with transfers, and she was the primary caregiver for her recently deceased daughter   CLOF: Sister provides supervision daily for pt to ambulate using RW safely and provides assist for ADLs (showering and dressing - progressing towards supervision with these activities); Sister states pt requires cues to turn and step back fully prior to sitting to ensure her safety  PATIENT GOALS: get back to being able to go shopping with improved community level ambulation without the walker; return to driving    OBJECTIVE:  Note: Objective measures were completed at Evaluation unless otherwise noted.  DIAGNOSTIC FINDINGS:   EXAM: MRI HEAD WITHOUT CONTRAST   TECHNIQUE: Multiplanar, multiecho pulse sequences of the brain and surrounding structures were obtained without intravenous contrast.   COMPARISON:  Brain MRI 07/31/2022.  IMPRESSION: 1. Patchy acute cortical/subcortical infarcts within the left parietal and occipital lobes individually measuring up to 3 cm (MCA vascular territory). Subtle petechial hemorrhage associated with some of these infarcts. No frank hemorrhagic conversion. 2. Additional punctate left MCA territory cortical acute infarct within the left insula. 3. Background parenchymal atrophy and chronic small vessel ischemic disease with chronic lacunar infarcts, as described. 4. Paranasal sinus disease as outlined (including severe right maxillary sinusitis).   Electronically Signed   By: Rockey Childs D.O.   On: 12/20/2023 11:52  COGNITION: Overall cognitive status: Within functional limits for tasks assessed   SENSATION: Light touch: initially misses the first 1-2 touches on R LE, but then able to feel remaining touches Proprioception: Impaired grossly  COORDINATION: Symmetrical heel-to-shin bilaterally  EDEMA:  Not formally assessed, but none  observed  MUSCLE TONE: Not formally assessed  POSTURE: rounded shoulders, forward head, and posterior pelvic tilt  LOWER EXTREMITY MMT:    MMT Right Eval Left Eval  Hip flexion 3+, no pain 3+ with some L hip and back pain with this  Hip extension    Hip abduction    Hip adduction    Hip internal rotation    Hip external rotation    Knee flexion 3+ 4  Knee extension 4- 4  Ankle dorsiflexion 3+ 4  Ankle plantarflexion 4- 4  Ankle inversion    Ankle eversion    (Blank rows = not tested)  BED MOBILITY:  Findings: Sit to supine need to assess Supine to sit need to assess *pt reports some difficulty  with bed mobility  TRANSFERS: Sit to stand: SBA  Assistive device utilized: Environmental consultant - 2 wheeled     Stand to sit: SBA  Assistive device utilized: Environmental consultant - 2 wheeled     Chair to chair: SBA  Assistive device utilized: Environmental consultant - 2 wheeled       GAIT: Findings: Gait Characteristics: slight R ankle instability noted, step through pattern, decreased stance time- Right, decreased stride length, and decreased ankle dorsiflexion- Right, Distance walked: ~173ft, Assistive device utilized:Walker - 2 wheeled, Level of assistance: SBA and CGA, and Comments:    FUNCTIONAL TESTS:  5 times sit to stand: 38.75 seconds, using UE support 10 meter walk test: 0.48 m/s using youth RW, requires CGA for safety 6 minute walk test: Visit 2, see note Berg Balance Scale: Visit 2, see note Functional gait assessment: need to assess, when appropriate  PATIENT SURVEYS:  ABC scale 7.5%                                                                                                                             TREATMENT DATE: 03/03/2024  Unless otherwise stated, CGA was provided and gait belt donned in order to ensure pt safety  TE- To improve strength, endurance, mobility, and function of specific targeted muscle groups or improve joint range of motion or improve muscle flexibility   Nu-step for B UE and LE reciprocal movement training x 7 min spm above 45 lvl 1.    TA- To improve functional movements patterns for everyday tasks    Circuit at safety bar  STS 1x10  Standing marches 1 min 30 sec  REST 30 seconds  LAQ 1x10 BLE   Standing hip abd BLE 1x15   Extensive VC/TC to keep foot pointed FWD during abduction, also cued to limit hip flexion when abducting leg.   Standing hip ext  BLE 1x12   Partial reverse lunge 1x12 BLE   STS 1x10   Short distance variable ambulation without AD. Placed 6 Basketballs within 51ft vicinity of pt. From seated position pt asked to stand, walk to a basketball, reach for basketball  and shoot at basket, before repeating. Balls were placed at eye level, overhead, and  waist level to encourage dynamic reach.  Total distance walked approx 40-81ft.  Pt was very encouraged to walk without AD.            PATIENT EDUCATION: Education details: PT POC, findings on assessment today, recommendation to continue using RW for all functional mobility Person educated: Patient and Caregiver [facility] Education method: Explanation Education comprehension: verbalized understanding and needs further education  HOME EXERCISE PROGRAM: Access Code: [email or web address] URL: [email or web address] Date: 02/04/2024 Prepared by: Peggye Linear  Exercises - Standing March with Counter Support  - 2 x daily - 7 x weekly - 2 sets - 20 reps - Side stepping with counter support: yellow band loop  - 2 x daily - 7 x weekly - 2 sets - 20 reps - Sit to Stand with Counter Support  - 2 x daily - 7 x weekly - 2 sets - 12 reps   GOALS: Goals reviewed with patient? Yes  SHORT TERM GOALS: Target date: 03/10/2024  Pt will be independent with HEP in order to improve strength and balance in order to decrease fall risk and improve function at home and work.  Baseline: need to initiate Goal status:  ONGOING: 7/16: 2-3x week  LONG TERM GOALS: Target date: 04/21/2024  1.  Patient will complete five times sit to stand test in < 15 seconds indicating an increased LE strength and improved balance. Baseline:  38.75 seconds, using UE support, 7/16: 20.35 sec, UE support on 1st STS only Goal Status: ONGOING   2.  Patient will increase ABC scale score >80% to demonstrate better functional mobility and better confidence with ADLs.   Baseline: 7.5%, 7/16: 18.125% Goal status: INITIAL   3.  Patient will increase Berg Balance score by > 6 points to demonstrate decreased fall risk during functional activities. Baseline: need to assess; 01/30/2024= 34/56, 7/16= 39 Goal status: INITIAL   4. Patient will increase 10  meter walk test to >1.60m/s as to improve gait speed for better community ambulation and to reduce fall risk. Baseline: 0.48 m/s using youth RW, requires CGA for safety, 7/16: 0.69 m/s  Goal status: INITIAL  5. Patient will increase six minute walk test distance to >1000 for progression to community ambulator and improve gait ability Baseline: need to assess; 01/30/2024=410 feet using RW; 02/13/24: 459ft in 29m30s, 7/16: 662ft using 2WW Goal status: ONGOING   ASSESSMENT:  CLINICAL IMPRESSION:   Pt reports to PT today with good motivation. Pt tolerated all activities well today. Did demo good strength and concentric push with STS but can work on her eccentric quad control when sitting as she plopped a few times. Pt had significantly difficulty with standing hip abduction today and required extensive cueing for proper technique and form. Pt displayed good progress with no AD ambulation today and was able to ambulate about 40-50 ft with just CGA assist from SPT. Pt was very pleased with her progress without the AD. Pt will continue to benefit from skilled physical therapy intervention to address impairments, improve QOL, and attain therapy goals.    OBJECTIVE IMPAIRMENTS: Abnormal gait, decreased activity tolerance, decreased balance, decreased endurance, decreased knowledge of use of DME, decreased mobility, difficulty walking, decreased strength, decreased safety awareness, impaired vision/preception, and pain.   ACTIVITY LIMITATIONS: carrying, lifting, bending, standing, squatting, stairs, transfers, bed mobility, bathing, toileting, dressing, reach over head, hygiene/grooming, and locomotion level  PARTICIPATION LIMITATIONS: meal prep, cleaning, laundry, driving, shopping, and community activity  PERSONAL FACTORS: Age, Time since onset of injury/illness/exacerbation, and 3+ comorbidities:  Hypertension, B12 deficiency, type 2 diabetes, hyperlipidemia, CKD stage IIIa are also affecting patient's  functional outcome.   REHAB POTENTIAL: Good  CLINICAL DECISION MAKING: Evolving/moderate complexity  EVALUATION COMPLEXITY: Moderate  PLAN:  PT FREQUENCY: 1-2x/week  PT DURATION: 12 weeks  PLANNED INTERVENTIONS: 97164- PT Re-evaluation, 97750- Physical Performance Testing, 97110-Therapeutic exercises, 97530- Therapeutic activity, [id number]- Neuromuscular re-education, 97535- Self Care, [id number]- Manual therapy, [id number]- Gait training, [phone]- Orthotic Initial, [phone]- Orthotic/Prosthetic subsequent, [phone]- Canalith repositioning, [phone]- Electrical stimulation (manual), Patient/Family education, Balance training, Stair training, Taping, Joint mobilization, Spinal mobilization, Vestibular training, Visual/preceptual remediation/compensation, DME instructions, Cryotherapy, Moist heat, and Biofeedback  PLAN FOR NEXT SESSION:  - FGA when appropriate and add LTG - floor transfers training - continue HIIT - continue gait training without AD for balance and righting (include multi plane, multidirectional)  - dynamic reaching and dynamic stepping balance    Casilda Human, SPT    3:06 PM, 03/03/24  Note: Portions of this document were prepared using Dragon voice recognition software and although reviewed may contain unintentional dictation errors in syntax, grammar, or spelling.

## 2024-03-03 NOTE — Therapy (Addendum)
 Occupational Therapy Progress Note  Dates of reporting period  01/28/2024   to   03/03/2024   Patient Name: Haley Jones MRN: [id number] DOB:1955/09/07, 68 y.o., female Today's Date: 03/03/2024  PCP: Cleotilde Oneil FALCON, MD REFERRING PROVIDER: Pegge Toribio PARAS, PA-C   OT End of Session - 03/03/24 1656     Visit Number 10    Number of Visits 24    Date for OT Re-Evaluation 04/22/24    OT Start Time 1315    OT Stop Time 1400    OT Time Calculation (min) 45 min    Activity Tolerance Patient tolerated treatment well    Behavior During Therapy [facility] for tasks assessed/performed             Past Medical History:  Diagnosis Date   Adnexal mass    Anxiety    Chronic back pain    Depression    Diabetes mellitus without complication (HCC)    Endometriosis 03/12/2018   GERD (gastroesophageal reflux disease)    Heart murmur 02/2018   undetected until she was an adult. 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   Weight loss 12/06/2017   Past Surgical History:  Procedure Laterality Date   ABDOMINAL HYSTERECTOMY  2008   ovaries also   CESAREAN SECTION  1994   COLONOSCOPY     COLONOSCOPY WITH PROPOFOL  N/A 02/12/2018   Procedure: COLONOSCOPY WITH PROPOFOL ;  Surgeon: Toledo, Ladell POUR, MD;  Location: [facility] ENDOSCOPY;  Service: Gastroenterology;  Laterality: N/A;   ESOPHAGOGASTRODUODENOSCOPY (EGD) WITH PROPOFOL  N/A 02/12/2018   Procedure: ESOPHAGOGASTRODUODENOSCOPY (EGD) WITH PROPOFOL ;  Surgeon: Toledo, Ladell POUR, MD;  Location: [facility] ENDOSCOPY;  Service: Gastroenterology;  Laterality: N/A;   EYE SURGERY Left 1973   muscle shortening to straighten cross eyes   LAPAROSCOPY N/A 02/20/2018   Procedure: LAPAROSCOPY DIAGNOSTIC, ( RESECTION OF RIGHT LOWER QUADRANT ABDOMINAL MASS);  Surgeon: Nicholaus Selinda Birmingham, MD;  Location: [facility] ORS;  Service: General;  Laterality: N/A;   ROTATOR CUFF REPAIR Left 2011    Patient Active Problem List   Diagnosis Date Noted   Depression with anxiety 01/04/2024   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   Acute CVA (cerebrovascular accident) (HCC) 12/20/2023   Primary hypertension 12/20/2023   Acute kidney injury superimposed on chronic kidney disease (HCC) 12/20/2023   DM type 2 with diabetic mixed hyperlipidemia (HCC) 11/17/2022   Major depressive disorder, recurrent, 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  Rationale for Evaluation and Treatment: Rehabilitation  SUBJECTIVE:   SUBJECTIVE STATEMENT: Pt. Reports that she has been able to sustain her grasp using R hand onto cups at home, and drink from a cup without spilling or dropping the cup. Pt accompanied by: self and family member  PERTINENT HISTORY: Pt. Is a 68 yo female with hx of a L MCA CVA. Pt. Was admitted into ED on 12/20/2023 with an onset of RUE weakness, numbness dysmetria, and visual deficits. Upon assessment, it was concluded that Pt. Experienced multiple infarcts in the L  Parietal/Occipital region. PMHx: Dizziness, Falls  PRECAUTIONS: None  WEIGHT BEARING RESTRICTIONS: No  PAIN:  Are you having pain? No  FALLS: Has patient fallen in last 6 months? Yes. Number of falls 10  LIVING ENVIRONMENT: Lives with: lives with their family and lives alone Lives in: House/apartment Stairs: No, Ramp Has following equipment at home: Walker - 2 wheeled, Shower bench, and bed side commode  PLOF: Independent  PATIENT GOALS: To be able to feed herself and hold a fork and knife better.    OBJECTIVE:  Note: Objective measures were completed at Evaluation unless otherwise noted.  HAND DOMINANCE: Left  ADLs: Overall ADLs:  Transfers/ambulation related to ADLs:  Eating: Independent  Grooming: independent UB Dressing: Pt. Has difficulty managing buttons LB Dressing: jean zippers are difficult to manipulate, hiking pants, and clothing negotiation are difficult. Toileting: Independent Bathing: Independent Tub Shower transfers: stepping into shower is difficult.  Equipment: Grab bars, shower bench, bedside commode  IADLs: Shopping: Does not currently do that/has not tried Light housekeeping: Does not currently do that/has not tried Meal Prep: Has not cooked in months, did not prior to CVA, sister provides meals. Community mobility: Relies on family, and friends Medication management: Assistance from sister to initiate medication management, she gives herself a shot for diabetes Financial management: Sister is assisting with monthly bill management Handwriting: Cursive form is 10% legible; Printed form is 90% legible  MOBILITY STATUS: Needs Assist: CGA and Hx of falls  POSTURE COMMENTS:  No Significant postural limitations Sitting balance: Good  ACTIVITY TOLERANCE: Activity tolerance: Good  FUNCTIONAL OUTCOME MEASURES: MAM-20: TBD  UPPER EXTREMITY ROM:    Active ROM Right eval Right 03/03/2024 Left Eval [facility]  Shoulder flexion 50(58) 61(74)   Shoulder abduction 43(48) 45(58)   Shoulder adduction     Shoulder extension     Shoulder internal rotation     Shoulder external rotation     Elbow flexion 118(131) 125(128)   Elbow extension -24(-22) -5(0)   Wrist flexion 20 (21) 42(48)   Wrist extension 30(31) 30(34)   Wrist ulnar deviation     Wrist radial deviation     Wrist pronation     Wrist supination     (Blank rows = not tested)  UPPER EXTREMITY MMT:     MMT Right eval Right 03/03/2024 Left eval  Shoulder flexion 2/5 2/5 WFL  Shoulder  abduction 2/5 2/5 [facility]  Shoulder adduction     Shoulder extension     Shoulder internal rotation     Shoulder external rotation     Middle trapezius     Lower trapezius     Elbow flexion 4-/5 4/5 WFL  Elbow extension 4-/5 4/5 WFL  Wrist flexion 2+/5 4-/5 WFL  Wrist extension 3-/5 4-/5 WFL  Wrist ulnar deviation     Wrist radial deviation     Wrist pronation     Wrist supination     (Blank rows = not tested)  HAND FUNCTION: Grip strength: Right: 21 lbs; Left: 14 lbs, Lateral pinch: Right: 7 lbs, Left: 4 lbs, and 3 point pinch: Right: 4 lbs, Left: 5 lbs  03/03/2024:  Grip strength: Right: 23#, Left: 28#, Lateral pinch: Right: 6#, Left: 5#, 3 point pinch: Right: 6#, Left: 3#  COORDINATION: 9 Hole Peg test: Right: 72 sec; Left: 40 sec  03/03/2024: 9 hole peg test: Right: 1 min 3 sec, Left: 36 sec  SENSATION: Light touch: Impaired  Proprioception: Impaired   EDEMA: none  MUSCLE TONE: RUE: Mild  COGNITION: Overall cognitive status: Impaired  VISION: Subjective report: Pt. Has not noticed the changes in vision but her sister has. Caregiver reports that Pt. Does not bring much attention to the R side.  Baseline vision:  Visual history:   VISION ASSESSMENT: TBD   PERCEPTION: TBD  PRAXIS: Impaired: Motor planning                                                                                                                           TREATMENT DATE: 03/03/2024   Measurements obtained and education was provided as indicated below.   -Pt. worked on Careers information officer and speed. Pt. was able to write 4 lines in  3 min 30 sec. Pt. used a tripod grasp on the pen, using the L hand. Pt. was able to hold the pen for the entire exercise.     PATIENT EDUCATION: Education details: Grip/Pinch strength, POC, ROM and BUE strength, BUE motor control, handwriting, pre-writing skills Person educated: Patient and Sister Education method: Explanation, Demonstration, Tactile cues, and  Verbal cues Education comprehension: verbalized understanding and returned demonstration  HOME EXERCISE PROGRAM: Yellow theraputty HEP   GOALS: Goals reviewed with patient? Yes  SHORT TERM GOALS: Target date: 03/10/2024    Pt. Will be independent utilizing HEPs for hand strengthening exercises.  Baseline: Eval: No current HEP program Goal status: INITIAL   LONG TERM GOALS: Target date: 04/21/2024   Pt. Will improve BUE grip strength by 5# of force to be able to securely hold items. Baseline:  03/03/2024: R grip strength: 23#, L grip strength: 28#. Pt. has difficulty securely holding items in her hand. Eval: R grip strength: 21#, L grip strength: 14# Goal status: progressing, ongoing  2.  Pt. Will improve RUE [facility] skills by 3 sec. to be able to manipulate zippers/buttons. Baseline: 03/03/2024: 9 hole peg test: R side: 1 min 3 sec, L side: 36 sec. 9 hole peg test; R side: 72 secs, L side: 40 secs Goal status: progressing, ongoing  3.  Pt. Will increase R shoulder flex/shoulder ABD ROM by 10 degrees to perform ADLs/IADLs.  Baseline: 03/03/2024: R shoulder flex: 61(74), R shoulder ABD: 45(58) R shoulder flex: 50(58), R shoulder ABD: 43(48) Goal status: progressing, ongoing  4.  Pt. Will improve handwriting to 50% legible in cursive form, legibility to be able to send written correspondence Baseline: 03/03/2024: 50% legible in cursive form, Eval: 10% legible in cursive form, 90% legible in printed form. Goal status: Continue  5.  Pt. Will improve BUE pinch strength by 2# of force to  be able to assist with hiking pants Baseline: 03/03/2024: Lateral Pinch: R: 6#, L: 5#, 3 pt pinch: R: 6#, L: 3#.  Eval: Lateral Pinch: 7, R 3 pt. Pinch: 4, L Lateral Pinch: 4, L 3 pt. Pinch: 5 Goal status: partially met  6.  Pt. Will increase R wrist extension/flexion by 5 degrees in anticipation of reaching hold of items for ADLs. Baseline: 03/03/2024: R wrist  ext: 30 (34), R Wrist flex: 42(48) Eval: R Wrist  ext: 20(21), R Wrist flex: 30(31) Goal status: Continue  ASSESSMENT:  CLINICAL IMPRESSION:  Pt. tolerated treatment well today. Pt. Presents with limited RUE AROM/PROM, decreased BUE Grip strength and pinch strength, decreased BUE strength and decreased [facility] skills. However, Pt. Has improved with L grip strength by 14#, and R grip strength by 2#. Pt. Has made improvements with RUE [facility] skills during 9 hole peg test, decreasing completion time by 9 sec. Pt. Has improved RUE ROM overall, more specifically during R shoulder flexion and R wrist flexion. Pt. Has increased AROM shoulder flexion by 11 degrees, and an 22 degree increase during AROM wrist flexion. Pt. Continues to have difficulty with handwriting skills and formulating words and shapes on blank, unlined paper. Pt. was able to formulate 4 sentences within 3 min and 30 sec using the L hand with standard pen. Pt. Attempted to complete pre-writing exercises, however, Pt. has difficulty formulating looping shapes, alternating slanted figures, and vertical 8 figures. Pt. continues to benefit from OT services to work on RUE ROM, RUE strengthening, bilateral grip strength, bilateral pinch strength, bilateral [facility] skills, and Right sided awareness and attention in order to improve functional independence of ADL/IADL tasks at home.   PERFORMANCE DEFICITS: in functional skills including ADLs, IADLs, coordination, dexterity, proprioception, sensation, tone, ROM, strength, pain, Fine motor control, Gross motor control, endurance, and UE functional use, and psychosocial skills including environmental adaptation, habits, interpersonal interactions, and routines and behaviors.   IMPAIRMENTS: are limiting patient from ADLs, IADLs, rest and sleep, work, leisure, and social participation.   CO-MORBIDITIES: may have co-morbidities  that affects occupational performance. Patient will benefit from skilled OT to address above impairments and improve overall  function.  MODIFICATION OR ASSISTANCE TO COMPLETE EVALUATION: Min-Moderate modification of tasks or assist with assess necessary to complete an evaluation.  OT OCCUPATIONAL PROFILE AND HISTORY: Detailed assessment: Review of records and additional review of physical, cognitive, psychosocial history related to current functional performance.  CLINICAL DECISION MAKING: Moderate - several treatment options, min-mod task modification necessary  REHAB POTENTIAL: Good  EVALUATION COMPLEXITY: Moderate    PLAN:  OT FREQUENCY: 2x/week  OT DURATION: 12 weeks  PLANNED INTERVENTIONS: 97535 self care/ADL training, 02889 therapeutic exercise, 97530 therapeutic activity, 97112 neuromuscular re-education, 97018 paraffin, 02989 moist heat, 97010 cryotherapy, 97034 contrast bath, 97032 electrical stimulation (manual), passive range of motion, visual/perceptual remediation/compensation, energy conservation, patient/family education, and DME and/or AE instructions  RECOMMENDED OTHER SERVICES: ST & PT  CONSULTED AND AGREED WITH PLAN OF CARE: Patient and family member/caregiver  PLAN FOR NEXT SESSION: treatment  Damien Nap, OTS  This entire session was performed under direct supervision and direction of a licensed therapist/therapist assistant . I have personally read, edited and approve of the note as written.   Richardson Otter, MS, OTR/L   03/03/2024, 4:59 PM

## 2024-03-05 ENCOUNTER — Ambulatory Visit: Admitting: Occupational Therapy

## 2024-03-05 ENCOUNTER — Ambulatory Visit: Admitting: Speech Pathology

## 2024-03-05 ENCOUNTER — Ambulatory Visit: Admitting: Physical Therapy

## 2024-03-05 DIAGNOSIS — R2681 Unsteadiness on feet: Secondary | ICD-10-CM

## 2024-03-05 DIAGNOSIS — M6281 Muscle weakness (generalized): Secondary | ICD-10-CM

## 2024-03-05 DIAGNOSIS — R269 Unspecified abnormalities of gait and mobility: Secondary | ICD-10-CM

## 2024-03-05 DIAGNOSIS — I69351 Hemiplegia and hemiparesis following cerebral infarction affecting right dominant side: Secondary | ICD-10-CM

## 2024-03-05 DIAGNOSIS — R41841 Cognitive communication deficit: Secondary | ICD-10-CM

## 2024-03-05 DIAGNOSIS — R278 Other lack of coordination: Secondary | ICD-10-CM

## 2024-03-05 NOTE — Therapy (Addendum)
 Occupational Therapy Treatment Note   Patient Name: Haley Jones MRN: [id number] DOB:[birth year], 68 y.o., female Today's Date: 03/05/2024  PCP: Cleotilde Oneil FALCON, MD REFERRING PROVIDER: Pegge Toribio PARAS, PA-C   OT End of Session - 03/05/24 1708     Visit Number 11    Number of Visits 24    Date for OT Re-Evaluation 04/22/24    OT Start Time 1445    OT Stop Time 1530    OT Time Calculation (min) 45 min    Activity Tolerance Patient tolerated treatment well    Behavior During Therapy [facility] for tasks assessed/performed              Past Medical History:  Diagnosis Date   Adnexal mass    Anxiety    Chronic back pain    Depression    Diabetes mellitus without complication (HCC)    Endometriosis 03/12/2018   GERD (gastroesophageal reflux disease)    Heart murmur 02/2018   undetected until she was an adult. 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   Weight loss 12/06/2017   Past Surgical History:  Procedure Laterality Date   ABDOMINAL HYSTERECTOMY  2008   ovaries also   CESAREAN SECTION  1994   COLONOSCOPY     COLONOSCOPY WITH PROPOFOL  N/A 02/12/2018   Procedure: COLONOSCOPY WITH PROPOFOL ;  Surgeon: Toledo, Ladell POUR, MD;  Location: [facility] ENDOSCOPY;  Service: Gastroenterology;  Laterality: N/A;   ESOPHAGOGASTRODUODENOSCOPY (EGD) WITH PROPOFOL  N/A 02/12/2018   Procedure: ESOPHAGOGASTRODUODENOSCOPY (EGD) WITH PROPOFOL ;  Surgeon: Toledo, Ladell POUR, MD;  Location: [facility] ENDOSCOPY;  Service: Gastroenterology;  Laterality: N/A;   EYE SURGERY Left 1973   muscle shortening to straighten cross eyes   LAPAROSCOPY N/A 02/20/2018   Procedure: LAPAROSCOPY DIAGNOSTIC, ( RESECTION OF RIGHT LOWER QUADRANT ABDOMINAL MASS);  Surgeon: Nicholaus Selinda Birmingham, MD;  Location: [facility] ORS;  Service: General;  Laterality: N/A;   ROTATOR CUFF REPAIR Left 2011   Patient Active Problem List   Diagnosis Date Noted    Depression with anxiety 01/04/2024   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   Acute CVA (cerebrovascular accident) (HCC) 12/20/2023   Primary hypertension 12/20/2023   Acute kidney injury superimposed on chronic kidney disease (HCC) 12/20/2023   DM type 2 with diabetic mixed hyperlipidemia (HCC) 11/17/2022   Major depressive disorder, recurrent, 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  Rationale for Evaluation and Treatment: Rehabilitation  SUBJECTIVE:   SUBJECTIVE STATEMENT: Pt. Reports that she has been able to make light meals at home using BUE.  Pt accompanied by: self and family member  PERTINENT HISTORY: Pt. Is a 68 yo female with hx of a L MCA CVA. Pt. Was admitted into ED on 12/20/2023 with an onset of RUE weakness, numbness dysmetria, and visual deficits. Upon assessment, it was concluded that Pt. Experienced multiple infarcts in the L Parietal/Occipital region. PMHx: Dizziness, Falls  PRECAUTIONS: None  WEIGHT BEARING RESTRICTIONS: No  PAIN:  Are you having pain? 03/05/2024: No pain, 2-3/10 level  of discomfort during use of green resisitve clips.    FALLS: Has patient fallen in last 6 months? Yes. Number of falls 10  LIVING ENVIRONMENT: Lives with: lives with their family and lives alone Lives in: House/apartment Stairs: No, Ramp Has following equipment at home: Walker - 2 wheeled, Shower bench, and bed side commode  PLOF: Independent  PATIENT GOALS: To be able to feed herself and hold a fork and knife better.   OBJECTIVE:  Note: Objective measures were  completed at Evaluation unless otherwise noted.  HAND DOMINANCE: Left  ADLs: Overall ADLs:  Transfers/ambulation related to ADLs:  Eating: Independent  Grooming: independent UB Dressing: Pt. Has difficulty managing buttons LB Dressing: jean zippers are difficult to manipulate, hiking pants, and clothing negotiation are difficult. Toileting: Independent Bathing: Independent Tub Shower transfers: stepping into shower is difficult.  Equipment: Grab bars, shower bench, bedside commode  IADLs: Shopping: Does not currently do that/has not tried Light housekeeping: Does not currently do that/has not tried Meal Prep: Has not cooked in months, did not prior to CVA, sister provides meals. Community mobility: Relies on family, and friends Medication management: Assistance from sister to initiate medication management, she gives herself a shot for diabetes Financial management: Sister is assisting with monthly bill management Handwriting: Cursive form is 10% legible; Printed form is 90% legible  MOBILITY STATUS: Needs Assist: CGA and Hx of falls  POSTURE COMMENTS:  No Significant postural limitations Sitting balance: Good  ACTIVITY TOLERANCE: Activity tolerance: Good  FUNCTIONAL OUTCOME MEASURES: MAM-20: TBD  UPPER EXTREMITY ROM:    Active ROM Right eval Right 03/03/2024 Left Eval [facility]  Shoulder flexion 50(58) 61(74)   Shoulder abduction 43(48) 45(58)   Shoulder adduction     Shoulder extension     Shoulder internal rotation     Shoulder external rotation     Elbow flexion 118(131) 125(128)   Elbow extension -24(-22) -5(0)   Wrist flexion 20 (21) 42(48)   Wrist extension 30(31) 30(34)   Wrist ulnar deviation     Wrist radial deviation     Wrist pronation     Wrist supination     (Blank rows = not tested)  UPPER EXTREMITY MMT:     MMT Right eval Right 03/03/2024 Left eval  Shoulder flexion 2/5 2/5 WFL  Shoulder abduction 2/5 2/5 [facility]  Shoulder adduction      Shoulder extension     Shoulder internal rotation     Shoulder external rotation     Middle trapezius     Lower trapezius     Elbow flexion 4-/5 4/5 WFL  Elbow extension 4-/5 4/5 WFL  Wrist flexion 2+/5 4-/5 WFL  Wrist extension 3-/5 4-/5 WFL  Wrist ulnar deviation     Wrist radial deviation     Wrist pronation     Wrist supination     (Blank rows = not tested)  HAND FUNCTION: Grip strength: Right: 21 lbs; Left: 14 lbs, Lateral pinch: Right: 7 lbs, Left: 4 lbs, and 3 point pinch: Right: 4 lbs, Left: 5 lbs  03/03/2024:  Grip strength: Right: 23#, Left: 28#, Lateral pinch: Right: 6#, Left: 5#, 3 point pinch: Right: 6#, Left: 3#  COORDINATION: 9 Hole Peg test: Right: 72 sec; Left: 40 sec  03/03/2024: 9 hole peg test: Right: 1 min 3 sec, Left: 36 sec  SENSATION: Light touch: Impaired  Proprioception: Impaired   EDEMA: none  MUSCLE TONE: RUE: Mild  COGNITION: Overall cognitive status: Impaired  VISION: Subjective report: Pt.  Has not noticed the changes in vision but her sister has. Caregiver reports that Pt. Does not bring much attention to the R side.  Baseline vision:  Visual history:   VISION ASSESSMENT: TBD   PERCEPTION: TBD  PRAXIS: Impaired: Motor planning                                                                                                                           TREATMENT DATE: 03/05/2024   Therapeutic Activities:  -Pt. Performed BUE Lateral, and 3pt. Pinch strengthening using yellow, red, and green resisitve clips to place onto vertical dowel.  -Pt. Removed yellow, red, and green resisitve clips from vertical dowel in preparation to discard clips into container while crossing midline.   -Pt. Performed AROM in BUE shoulder flexion to remove playing cards from whiteboard placed at a vertical position.  -To grade down the task, Pt. Pointed with RUE to the appropriate selected playing cards on the whiteboard, to promote shoulder flexion, crossing  midline, and visual scanning.   Therapeutic Ex.:  Pt. Performed 3 reps of lateral key pinch/3 pt. Pinch in between applying resisitve clips onto vertical dowel.    Neuromuscular re-education:  -Pt. Performed BUE [facility] skills, using 1/8 beads to thread onto resisitve horizontal dowel, alternating hands to stabilize one end of the dowel, while placing 1/8 beads with the other.  -Pt. Attempted to use resistive tweezers, task was graded down to using digits to manipulate beads in preparation to thread onto resisitve dowel.   PATIENT EDUCATION: Education details: Grip/Pinch strength, POC, ROM and BUE strength, BUE motor control, handwriting, pre-writing skills Person educated: Patient and Sister Education method: Explanation, Demonstration, Tactile cues, and Verbal cues Education comprehension: verbalized understanding and returned demonstration  HOME EXERCISE PROGRAM: Yellow theraputty HEP   GOALS: Goals reviewed with patient? Yes  SHORT TERM GOALS: Target date: 03/10/2024    Pt. Will be independent utilizing HEPs for hand strengthening exercises.  Baseline: Eval: No current HEP program Goal status: INITIAL   LONG TERM GOALS: Target date: 04/21/2024   Pt. Will improve BUE grip strength by 5# of force to be able to securely hold items. Baseline:  03/03/2024: R grip strength: 23#, L grip strength: 28#. Pt. has difficulty securely holding items in her hand. Eval: R grip strength: 21#, L grip strength: 14# Goal status: progressing, ongoing  2.  Pt. Will improve RUE [facility] skills by 3 sec. to be able to manipulate zippers/buttons. Baseline: 03/03/2024: 9 hole peg test: R side: 1 min 3 sec, L side: 36 sec. 9 hole peg test; R side: 72 secs, L side: 40 secs Goal status: progressing, ongoing  3.  Pt. Will increase R shoulder flex/shoulder ABD ROM by 10 degrees to perform ADLs/IADLs.  Baseline: 03/03/2024: R shoulder flex: 61(74), R shoulder ABD: 45(58) R shoulder flex: 50(58), R shoulder ABD:  43(48) Goal status: progressing, ongoing  4.  Pt. Will improve handwriting to 50% legible in cursive form, legibility to be  able to send written correspondence Baseline: 03/03/2024: 50% legible in cursive form, Eval: 10% legible in cursive form, 90% legible in printed form. Goal status: Continue  5.  Pt. Will improve BUE pinch strength by 2# of force to  be able to assist with hiking pants Baseline: 03/03/2024: Lateral Pinch: R: 6#, L: 5#, 3 pt pinch: R: 6#, L: 3#.  Eval: Lateral Pinch: 7, R 3 pt. Pinch: 4, L Lateral Pinch: 4, L 3 pt. Pinch: 5 Goal status: partially met  6.  Pt. Will increase R wrist extension/flexion by 5 degrees in anticipation of reaching hold of items for ADLs. Baseline: 03/03/2024: R wrist ext: 30 (34), R Wrist flex: 42(48) Eval: R Wrist ext: 20(21), R Wrist flex: 30(31) Goal status: Continue  ASSESSMENT:  CLINICAL IMPRESSION:  Pt. Tolerated  the treatment session well today. Pt. Presented with difficulty performing LUE [facility] skills while stabilizing resisitve dowel with R hand, and threading beads onto dowel with the L hand. Pt. Continues to require consistent mod vc, tactile and visual demonstration for proper digit placement onto resisitve clips while performing 3 pt. And lateral key pinch. Pt. Presents with difficulty securing and applying resisitve clips onto vertical dowel, requiring assistance to adjust clips to continue task. Pt. was able to tolerate up to green resisitve clips, however, Pt. Presents with 2-3/10 discomfort at the radial aspect of the wrist when attempting to use of green level resisitve clips. The task was modified 2/2 to limited shoulder ROM  and graded down for the Pt. To utilize pointing to, and grasping target cards on a whiteboard at a vertical position instead of reaching a grasping playing cards with RUE. Pt. required the cards to be placed lower on the vertical board. Pt. continues to benefit from OT services to work on RUE ROM, RUE strengthening,  bilateral grip strength, bilateral pinch strength, bilateral [facility] skills, and Right sided awareness and attention in order to improve functional independence of ADL/IADL tasks at home.   PERFORMANCE DEFICITS: in functional skills including ADLs, IADLs, coordination, dexterity, proprioception, sensation, tone, ROM, strength, pain, Fine motor control, Gross motor control, endurance, and UE functional use, and psychosocial skills including environmental adaptation, habits, interpersonal interactions, and routines and behaviors.   IMPAIRMENTS: are limiting patient from ADLs, IADLs, rest and sleep, work, leisure, and social participation.   CO-MORBIDITIES: may have co-morbidities  that affects occupational performance. Patient will benefit from skilled OT to address above impairments and improve overall function.  MODIFICATION OR ASSISTANCE TO COMPLETE EVALUATION: Min-Moderate modification of tasks or assist with assess necessary to complete an evaluation.  OT OCCUPATIONAL PROFILE AND HISTORY: Detailed assessment: Review of records and additional review of physical, cognitive, psychosocial history related to current functional performance.  CLINICAL DECISION MAKING: Moderate - several treatment options, min-mod task modification necessary  REHAB POTENTIAL: Good  EVALUATION COMPLEXITY: Moderate    PLAN:  OT FREQUENCY: 2x/week  OT DURATION: 12 weeks  PLANNED INTERVENTIONS: 97535 self care/ADL training, 02889 therapeutic exercise, 97530 therapeutic activity, 97112 neuromuscular re-education, 97018 paraffin, 02989 moist heat, 97010 cryotherapy, 97034 contrast bath, 97032 electrical stimulation (manual), passive range of motion, visual/perceptual remediation/compensation, energy conservation, patient/family education, and DME and/or AE instructions  RECOMMENDED OTHER SERVICES: ST & PT  CONSULTED AND AGREED WITH PLAN OF CARE: Patient and family member/caregiver  PLAN FOR NEXT SESSION:  treatment  Damien Nap, OTS  This entire session was performed under direct supervision and direction of a licensed therapist/therapist assistant . I have personally read, edited and approve  of the note as written.   Richardson Otter, MS, OTR/L   03/05/2024, 5:11 PM

## 2024-03-05 NOTE — Therapy (Signed)
 OUTPATIENT PHYSICAL THERAPY TREATMENT      Patient Name: Haley Jones MRN: [id number] DOB:May 06, 1956, 68 y.o., female Today's Date: 03/05/2024  PCP: Dr. Oneil Pinal REFERRING PROVIDER: Toribio Pitch, PA-C  END OF SESSION:   PT End of Session - 03/05/24 1326     Visit Number 12    Number of Visits 24    Date for PT Re-Evaluation 04/21/24    Progress Note Due on Visit 20    PT Start Time 1401    PT Stop Time 1444    PT Time Calculation (min) 43 min    Equipment Utilized During Treatment Gait belt    Activity Tolerance Patient tolerated treatment well    Behavior During Therapy WFL for tasks assessed/performed            Past Medical History:  Diagnosis Date   Adnexal mass    Anxiety    Chronic back pain    Depression    Diabetes mellitus without complication (HCC)    Endometriosis 03/12/2018   GERD (gastroesophageal reflux disease)    Heart murmur 02/2018   undetected until she was an adult. 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   Weight loss 12/06/2017   Past Surgical History:  Procedure Laterality Date   ABDOMINAL HYSTERECTOMY  2008   ovaries also   CESAREAN SECTION  1994   COLONOSCOPY     COLONOSCOPY WITH PROPOFOL  N/A 02/12/2018   Procedure: COLONOSCOPY WITH PROPOFOL ;  Surgeon: Toledo, Ladell POUR, MD;  Location: [facility];  Service: Gastroenterology;  Laterality: N/A;   ESOPHAGOGASTRODUODENOSCOPY (EGD) WITH PROPOFOL  N/A 02/12/2018   Procedure: ESOPHAGOGASTRODUODENOSCOPY (EGD) WITH PROPOFOL ;  Surgeon: Toledo, Ladell POUR, MD;  Location: [facility];  Service: Gastroenterology;  Laterality: N/A;   EYE SURGERY Left 1973   muscle shortening to straighten cross eyes   LAPAROSCOPY N/A 02/20/2018   Procedure: LAPAROSCOPY DIAGNOSTIC, ( RESECTION OF RIGHT LOWER QUADRANT ABDOMINAL MASS);  Surgeon: Nicholaus Selinda Birmingham, MD;  Location: [facility];  Service: General;   Laterality: N/A;   ROTATOR CUFF REPAIR Left 2011   Patient Active Problem List   Diagnosis Date Noted   Urinary incontinence as sequela of cerebrovascular accident (CVA) 02/11/2024   Depression with anxiety 01/04/2024   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   Acute CVA (cerebrovascular accident) (HCC) 12/20/2023   Primary hypertension 12/20/2023   Acute kidney injury superimposed on chronic kidney disease (HCC) 12/20/2023   DM type 2 with diabetic mixed hyperlipidemia (HCC) 11/17/2022   Major depressive disorder, recurrent, 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/17/2022   B12 deficiency 02/23/2021   SBO (small bowel obstruction) (HCC) 01/29/2019   Barrett's esophagus without dysplasia 04/15/2018   Weight loss 12/06/2017   Hoarse 12/06/2017   Lumbar disc disease 07/26/2017   Diabetes mellitus type 2, controlled, without complications (HCC) 02/09/2015   Surgical menopause 11/26/2014    ONSET DATE: 12/20/2023  REFERRING DIAG: P36.487 (ICD-10-CM) - Left middle cerebral artery stroke (HCC)   THERAPY DIAG:  Muscle weakness (generalized)  Abnormality of gait  Hemiplegia and hemiparesis following cerebral infarction affecting right dominant side (HCC)  Unsteadiness on feet  Rationale for Evaluation and Treatment: Rehabilitation  SUBJECTIVE:  SUBJECTIVE STATEMENT:  Patient reports she is doing okay today. No changes to report.  PERTINENT HISTORY:Pt states she is not sure the exact date of when her CVA happened because she kept falling at home trying to care for herself and her daughter, but she finally went to the hospital when her sister insisted on it. Pt states her special needs daughter, [name], passed away recently while pt was in the hospital at [facility] before she was transferred to [facility] for stroke  rehabilitation.Pt's sister states pt requires supervision/cuing for ADLs  to don clothes correctly, with correct orientation (potential apraxia?) Pt/sister report pt has R side inattention.Pt reports supposedly one of her legs is shorter than the other, believes her R LE might be the shorter one. 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     PAIN:  Are you having pain? No  PRECAUTIONS: Fall RED FLAGS: None  WEIGHT BEARING RESTRICTIONS: No FALLS: Has patient fallen in last 6 months? Yes. Number of falls 6+ before going to hospital and 1 fall since being home from CIR - pt states she was trying to gather her clothes by herself rather than waiting for her sister to get there to help her  LIVING ENVIRONMENT Lives with: lives alone and but pt's sister, [name], comes over daily to assist with ADLs and stays to provide supervision all day - pt states at night she would be able to walk to bathroom using RW if needed (but she wears depends in event of incontinence) Lives in: House/apartment Stairs: she has steps, but she doesn't have to use them (house was wheelchair accessible for her daughter)  Has following equipment at home: Environmental consultant - 2 wheeled, shower chair, Grab bars, Ramped entry, and transport chair  PLOF: Independent, Independent with household mobility without device, Independent with homemaking with ambulation, Independent with gait, Independent with transfers, and she was the primary caregiver for her recently deceased daughter   CLOF: Sister provides supervision daily for pt to ambulate using RW safely and provides assist for ADLs (showering and dressing - progressing towards supervision with these activities); Sister states pt requires cues to turn and step back fully prior to sitting to ensure her safety  PATIENT GOALS: get back to being able to go shopping with improved community level ambulation without the walker; return to driving    OBJECTIVE:  Note: Objective measures were completed at Evaluation unless otherwise noted.  DIAGNOSTIC FINDINGS:   EXAM: MRI HEAD WITHOUT CONTRAST   TECHNIQUE: Multiplanar, multiecho pulse sequences of the brain and surrounding structures were obtained without intravenous contrast.   COMPARISON:  Brain MRI 07/31/2022.  IMPRESSION: 1. Patchy acute cortical/subcortical infarcts within the left parietal and occipital lobes individually measuring up to 3 cm (MCA vascular territory). Subtle petechial hemorrhage associated with some of these infarcts. No frank hemorrhagic conversion. 2. Additional punctate left MCA territory cortical acute infarct within the left insula. 3. Background parenchymal atrophy and chronic small vessel ischemic disease with chronic lacunar infarcts, as described. 4. Paranasal sinus disease as outlined (including severe right maxillary sinusitis).   Electronically Signed   By: Rockey Childs D.O.   On: 12/20/2023 11:52  COGNITION: Overall cognitive status: Within functional limits for tasks assessed   SENSATION: Light touch: initially misses the first 1-2 touches on R LE, but then able to feel remaining touches Proprioception: Impaired grossly  COORDINATION: Symmetrical heel-to-shin bilaterally  EDEMA:  Not formally assessed, but none  observed  MUSCLE TONE: Not formally assessed  POSTURE: rounded shoulders, forward head, and posterior pelvic tilt  LOWER EXTREMITY MMT:    MMT Right Eval Left Eval  Hip flexion 3+, no pain 3+ with some L hip and back pain with this  Hip extension    Hip abduction    Hip adduction    Hip internal rotation    Hip external rotation    Knee flexion 3+ 4  Knee extension 4- 4  Ankle dorsiflexion 3+ 4  Ankle plantarflexion 4- 4  Ankle inversion    Ankle eversion    (Blank rows = not tested)  BED MOBILITY:  Findings: Sit to supine need to assess Supine to sit need to assess *pt reports some difficulty  with bed mobility  TRANSFERS: Sit to stand: SBA  Assistive device utilized: Environmental consultant - 2 wheeled     Stand to sit: SBA  Assistive device utilized: Environmental consultant - 2 wheeled     Chair to chair: SBA  Assistive device utilized: Environmental consultant - 2 wheeled       GAIT: Findings: Gait Characteristics: slight R ankle instability noted, step through pattern, decreased stance time- Right, decreased stride length, and decreased ankle dorsiflexion- Right, Distance walked: ~112ft, Assistive device utilized:Walker - 2 wheeled, Level of assistance: SBA and CGA, and Comments:    FUNCTIONAL TESTS:  5 times sit to stand: 38.75 seconds, using UE support 10 meter walk test: 0.48 m/s using youth RW, requires CGA for safety 6 minute walk test: Visit 2, see note Berg Balance Scale: Visit 2, see note Functional gait assessment: need to assess, when appropriate  PATIENT SURVEYS:  ABC scale 7.5%                                                                                                                             TREATMENT DATE: 03/05/2024  Unless otherwise stated, CGA was provided and gait belt donned in order to ensure pt safety    TA- To improve functional movements patterns for everyday tasks     Sit to sit gait x 15 ft no AD then Stand to sit repeat x 10   -cues for eccentric control and for proper positioning on stand to sit portion  Sit to sit gait x 15 ft no AD then Stand to sit repeat x 10  LAQ 1x10 BLE with 2.5# AW and with slow eccentric portion  Gait no AD x 100 ft holding  ice cream cone in L UE  Seated rest  Gait no AD x 100 ft holding  ice cream cone in L UE  Seated rest  2 x 10 marching, cues for R LE clearance of step ( marching in front of steps)  Standing hip abduction x 10 ea, many cues for hip orientation, transitioned to sidestepping x 12 ea direction, still heavy cues but more hi pabduciton activation   TE- To improve strength, endurance, mobility, and function of specific targeted muscle  groups or improve joint range of motion or improve muscle flexibility  Seated clam x 10 x 5 sec hold with TB  Seated HS curl GTB x 10 ea LE, cues for full ROM and focus on movement              PATIENT EDUCATION: Education details: PT POC, findings on assessment today, recommendation to continue using RW for all functional mobility Person educated: Patient and Caregiver [name] Education method: Explanation Education comprehension: verbalized understanding and needs further education  HOME EXERCISE PROGRAM: Access Code: [email or web address] URL: [email or web address] Date: 02/04/2024 Prepared by: Peggye Linear  Exercises - Standing March with Counter Support  - 2 x daily - 7 x weekly - 2 sets - 20 reps - Side stepping with counter support: yellow band loop  - 2 x daily - 7 x weekly - 2 sets - 20 reps - Sit to Stand with Counter Support  - 2 x daily - 7 x weekly - 2 sets - 12 reps   GOALS: Goals reviewed with patient? Yes  SHORT TERM GOALS: Target date: 03/10/2024  Pt will be independent with HEP in order to improve strength and balance in order to decrease fall risk and improve function at home and work.  Baseline: need to initiate Goal status:  ONGOING: 7/16: 2-3x week  LONG TERM GOALS: Target date: 04/21/2024  1.  Patient will complete five times sit to stand test in < 15 seconds indicating an increased LE strength and improved balance. Baseline:  38.75 seconds, using UE support, 7/16: 20.35 sec, UE support on 1st STS only Goal Status: ONGOING   2.  Patient will increase ABC scale score >80% to demonstrate better functional mobility and better confidence with ADLs.   Baseline: 7.5%, 7/16: 18.125% Goal status: INITIAL   3.  Patient will increase Berg Balance score by > 6 points to demonstrate decreased fall risk during functional activities. Baseline: need to assess; 01/30/2024= 34/56, 7/16= 39 Goal status: INITIAL   4. Patient will increase 10 meter walk test to  >1.24m/s as to improve gait speed for better community ambulation and to reduce fall risk. Baseline: 0.48 m/s using youth RW, requires CGA for safety, 7/16: 0.69 m/s  Goal status: INITIAL  5. Patient will increase six minute walk test distance to >1000 for progression to community ambulator and improve gait ability Baseline: need to assess; 01/30/2024=410 feet using RW; 02/13/24: 421ft in 65m30s, 7/16: 638ft using 2WW Goal status: ONGOING   ASSESSMENT:  CLINICAL IMPRESSION:  Patient arrived with good motivation for completion of pt activities.  Pt challenged with eccentric control with STS and unassisted gait ( no AD) this date. Pt still unsteady without AD but is steadily improving. Pt responded well to cues for forward trunk lean with stand to sit this date and was instructed to continue with this focus as she practices at home. Pt will continue to benefit from skilled physical therapy intervention to address impairments, improve QOL, and attain therapy goals.     OBJECTIVE IMPAIRMENTS: Abnormal gait, decreased activity tolerance, decreased balance, decreased endurance, decreased knowledge of use of DME, decreased mobility, difficulty walking, decreased strength, decreased safety awareness, impaired vision/preception, and pain.   ACTIVITY LIMITATIONS: carrying, lifting, bending, standing, squatting, stairs, transfers, bed mobility, bathing, toileting, dressing, reach over head, hygiene/grooming, and locomotion level  PARTICIPATION LIMITATIONS: meal prep, cleaning, laundry, driving, shopping, and community activity  PERSONAL FACTORS: Age, Time since onset of injury/illness/exacerbation, and 3+ comorbidities: Hypertension, B12 deficiency, type 2 diabetes, hyperlipidemia,  CKD stage IIIa are also affecting patient's functional outcome.   REHAB POTENTIAL: Good  CLINICAL DECISION MAKING: Evolving/moderate complexity  EVALUATION COMPLEXITY: Moderate  PLAN:  PT FREQUENCY: 1-2x/week  PT  DURATION: 12 weeks  PLANNED INTERVENTIONS: 97164- PT Re-evaluation, 97750- Physical Performance Testing, 97110-Therapeutic exercises, 97530- Therapeutic activity, [id number]- Neuromuscular re-education, 97535- Self Care, [id number]- Manual therapy, [id number]- Gait training, [id number]- Orthotic Initial, [phone]- Orthotic/Prosthetic subsequent, [phone]- Canalith repositioning, [phone]- Electrical stimulation (manual), Patient/Family education, Balance training, Stair training, Taping, Joint mobilization, Spinal mobilization, Vestibular training, Visual/preceptual remediation/compensation, DME instructions, Cryotherapy, Moist heat, and Biofeedback  PLAN FOR NEXT SESSION:  - FGA when appropriate and add LTG - floor transfers training - continue HIIT - continue gait training without AD for balance and righting (include multi plane, multidirectional)  - dynamic reaching and dynamic stepping balance    Note: Portions of this document were prepared using Dragon voice recognition software and although reviewed may contain unintentional dictation errors in syntax, grammar, or spelling.  Lonni KATHEE Gainer PT ,DPT Physical Therapist- [place]  [facility]    1:27 PM, 03/05/24  Note: Portions of this document were prepared using Dragon voice recognition software and although reviewed may contain unintentional dictation errors in syntax, grammar, or spelling.

## 2024-03-05 NOTE — Therapy (Addendum)
 OUTPATIENT SPEECH LANGUAGE PATHOLOGY  COGNITIVE-COMMUNICATION TREATMENT   Patient Name: Haley Jones MRN: [id number] DOB:11/02/1955, 68 y.o., female Today's Date: 03/05/2024  Speech Therapy Progress Note  Dates of Reporting Period: 01/28/2024 to 03/05/2024  Objective: Patient has been seen for 10 speech therapy sessions this reporting period targeting pt's cognitive communication impairment. Patient is making progress toward LTGs and met 3 STGs this reporting period. See skilled intervention, clinical impressions, and goals below for details.   PCP: Cleotilde Oneil FALCON, MD  REFERRING PROVIDER: Pegge Toribio PARAS, PA-C    End of Session - 03/05/24 1314     Visit Number 10    Number of Visits 25    Date for SLP Re-Evaluation 04/27/24    Authorization Type [employer]    Authorization Time Period 01/28/2024 thru 04/27/2024    Authorization - Visit Number 10    Authorization - Number of Visits 24    Progress Note Due on Visit 10    SLP Start Time 1315    SLP Stop Time  1400    SLP Time Calculation (min) 45 min    Activity Tolerance Patient tolerated treatment well          Past Medical History:  Diagnosis Date   Adnexal mass    Anxiety    Chronic back pain    Depression    Diabetes mellitus without complication (HCC)    Endometriosis 03/12/2018   GERD (gastroesophageal reflux disease)    Heart murmur 02/2018   undetected until she was an adult. 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   Weight loss 12/06/2017   Past Surgical History:  Procedure Laterality Date   ABDOMINAL HYSTERECTOMY  2008   ovaries also   CESAREAN SECTION  1994   COLONOSCOPY     COLONOSCOPY WITH PROPOFOL  N/A 02/12/2018   Procedure: COLONOSCOPY WITH PROPOFOL ;  Surgeon: Toledo, Ladell POUR, MD;  Location: [facility];  Service: Gastroenterology;  Laterality: N/A;   ESOPHAGOGASTRODUODENOSCOPY (EGD) WITH  PROPOFOL  N/A 02/12/2018   Procedure: ESOPHAGOGASTRODUODENOSCOPY (EGD) WITH PROPOFOL ;  Surgeon: Toledo, Ladell POUR, MD;  Location: [facility];  Service: Gastroenterology;  Laterality: N/A;   EYE SURGERY Left 1973   muscle shortening to straighten cross eyes   LAPAROSCOPY N/A 02/20/2018   Procedure: LAPAROSCOPY DIAGNOSTIC, ( RESECTION OF RIGHT LOWER QUADRANT ABDOMINAL MASS);  Surgeon: Nicholaus Selinda Birmingham, MD;  Location: [facility];  Service: General;  Laterality: N/A;   ROTATOR CUFF REPAIR Left 2011   Patient Active Problem List   Diagnosis Date Noted   Urinary incontinence as sequela of cerebrovascular accident (CVA) 02/11/2024   Depression with anxiety 01/04/2024   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   Acute CVA (cerebrovascular accident) (HCC) 12/20/2023   Primary hypertension 12/20/2023   Acute kidney injury superimposed on chronic kidney disease (HCC) 12/20/2023   DM type 2 with diabetic mixed hyperlipidemia (HCC) 11/17/2022   Major depressive disorder, recurrent, 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/17/2022   B12 deficiency 02/23/2021   SBO (small bowel obstruction) (HCC) 01/29/2019   Barrett's esophagus without dysplasia 04/15/2018   Weight loss 12/06/2017   Hoarse 12/06/2017   Lumbar disc disease 07/26/2017   Diabetes mellitus type 2, controlled, without complications (HCC) 02/09/2015   Surgical menopause 11/26/2014    ONSET DATE: 12/20/23   REFERRING DIAG: CVA  THERAPY DIAG:  Cognitive communication deficit  Rationale for Evaluation and Treatment Rehabilitation  SUBJECTIVE:  PERTINENT HISTORY: Skyelar K. Schlachter is a 68 year old left-handed female who presented to [facility] on [facility]/23 with right side weakness/falls and dizziness. MRI of the brain showed patchy acute cortical/subcortical infarcts within the left parietal occipital lobes individually measuring up to 3 cm (MCA vascular territory). Subtle petechial hemorrhage associated with some of these infarcts.  No frank hemorrhagic  conversion.  Additional punctate left MCA territory cortical acute infarct within the left insula.  Background parenchymal atrophy and chronic small vessel ischemic disease with chronic lacunar infarcts.  CT angio showed severe stenosis/nonocclusive thrombus of the left MCA bifurcation affecting the inferior division.  Nonocclusive embolus also visible within the left parietal branch as well. Echocardiogram ejection fraction of 60 to 65% no wall motion abnormalities grade 1 diastolic dysfunction. Past medical history also noted for hypertension, B12 deficiency, type 2 diabetes mellitus, hyperlipidemia, CKD stage III, depression.  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 D/c home after CIR admission 12/28/23-01/18/24. Sister lives nearby and checks on patient throughout the day, assists with meals, bathing, and medications.  DIAGNOSTIC FINDINGS: see above  PAIN:  Are you having pain? No   FALLS: Has patient fallen in last 6 months?  Yes, Number of falls: 1 since returning home from rehab, multiple prior to hospitalization  LIVING ENVIRONMENT: Lives with: lives alone Lives in: House/apartment  PLOF:  Level of assistance: Independent with IADLs Employment: Retired   PATIENT GOALS   Pt states she wants to drive again  SUBJECTIVE STATEMENT: Pt alert, cooperative. Talkative,  Pt accompanied by: self  OBJECTIVE:   TODAY'S TREATMENT: Skilled treatment session focused on pt's cognitive communication goals. SLP facilitated session by providing the following interventions:  Pt reports good ability with medication management and having her sister double check behind her. Reports one error which they were able to correct. She also reports that I took my medicines right off when I get up in the morning.  Also reports that she had her sister get the groceries for her. Report improvement with preparing her breakfast and  lunch with her sister bringing her supper.   For re-assessment purposes the ACE III was administered.   Addenbrooke's Cognitive Examination - ACE III The Addenbrooke's Cognitive Examination-III (ACE-III) is a brief cognitive test that assesses five cognitive domains. The total score is 100 with higher scores indicating better cognitive functioning. Cut off scores of 88 and 82 are recommended for suspicion of dementia (88 has sensitivity of 1.00 and specificity of 0.96, 82 has sensitivity of 0.93 and specificity of 1.00). American Version B  Attention 13/18  Memory 22/26  Fluency 7/14  Language 24/26  Visuospatial 8/16  TOTAL ACE- III Score 74/100   Pt stated I have to get my other appts on my calendar. With Min A, pt able to add 2 dates to her calendar (overlooking exact time placement on calendar as per baseline).   PATIENT EDUCATION: Education details: as above Person educated: Patient Education method: Verbal cues Education comprehension: verbalized understanding and needs further education   HOME EXERCISE PROGRAM:   See above  GOALS:  Goals reviewed with patient? Yes  SHORT TERM GOALS: Target date: 10 sessions  Updated 03/05/2024 The patient will sustain attention in simple cognitive linguistic task for 5 minutes given min verbal cues.  Baseline: Goal status: INITIAL: MET: upgraded to With Min A, pt will demonstrate alternating attention between 2 basic tasks in 5 out of 7 opportunities.   2.  Patient will reduce impulsivity by preplanning steps before completing a task with moderate verbal cues. Baseline:  Goal status: INITIAL: MET  3.  Patient will complete Patient Reported Outcome Measure to assess self-perception of deficits.  Baseline:  Goal status: INITIAL: MET  4.  With Min A, patient will complete complex visual-spatial activities with 75% accuracy.   Baseline:   Goal status: INITIAL  5. With Mod I, pt will recall and follow therapy schedule with 80%  accuracy.   Baseline:  Goal status: INITIAL     LONG TERM GOALS: Target date: 04/27/2024  Updated 03/05/2024 Patient will alternate attention between two moderately complex tasks 80% acc, with min verbal cues.  Baseline:  Goal status: INITIAL: great progress made  2.  Patient will demonstrate emergent awareness by identifying 75% of errors with min cues for double checking. Baseline:  Goal status: INITIAL: great progress made  3.  Patient will recall safety precautions/mobility recommendations using visual aids if necessary to reduce risk for falls. Baseline:  Goal status: INITIAL: great progress made   ASSESSMENT:  CLINICAL IMPRESSION: Based on recent assessment, Caily Rakers presents with overall moderate cognitive communication impairment per standardized testing using the Cognitive Linguistic Quick Test. Primary deficit areas (severe) include attention, executive function, and visuospatial skills. Patient's impulsivity contributed significantly to testing errors, as did poor sustained attention to testing instructions. Impulsivity impacting safety at home, contributing to fall at home. Patient and sister report reduced frustration tolerance. She is often frustrated when being given instructions. Patient demonstrated some emergent awareness of errors on tasks such as mazes, but was unaware of perseverations in design generation and generative naming, and errors in clock drawing, symbol cancellation, and symbol trails tasks.   See the above treatment note for details on progress.   OBJECTIVE IMPAIRMENTS include attention, memory, awareness, and executive functioning. These impairments are limiting patient from managing medications, managing appointments, managing finances, household responsibilities, ADLs/IADLs, and effectively communicating at home and in community. Factors affecting potential to achieve goals and functional outcome are ability to learn/carryover information and  severity of impairments. Patient will benefit from skilled SLP services to address above impairments and improve overall function.  REHAB POTENTIAL: Good  PLAN: SLP FREQUENCY: 1-2x/week  SLP DURATION: 12 weeks  PLANNED INTERVENTIONS: Environmental controls, Cueing hierachy, Cognitive reorganization, Internal/external aids, Functional tasks, SLP instruction and feedback, Compensatory strategies, Patient/family education, and 07492 Treatment of speech (30 or 45 min)    Keiran Sias B. Rubbie, M.S., CCC-SLP, Tree surgeon Certified Brain Injury Specialist [facility]  [facility] Rehabilitation Services Office [phone] Ascom [phone] Fax [phone]

## 2024-03-10 ENCOUNTER — Ambulatory Visit: Admitting: Occupational Therapy

## 2024-03-10 ENCOUNTER — Ambulatory Visit: Admitting: Speech Pathology

## 2024-03-10 ENCOUNTER — Ambulatory Visit

## 2024-03-10 ENCOUNTER — Encounter: Payer: Self-pay | Admitting: Physical Therapy

## 2024-03-10 DIAGNOSIS — R278 Other lack of coordination: Secondary | ICD-10-CM

## 2024-03-10 DIAGNOSIS — R41841 Cognitive communication deficit: Secondary | ICD-10-CM

## 2024-03-10 DIAGNOSIS — R269 Unspecified abnormalities of gait and mobility: Secondary | ICD-10-CM

## 2024-03-10 DIAGNOSIS — R2681 Unsteadiness on feet: Secondary | ICD-10-CM

## 2024-03-10 DIAGNOSIS — I69351 Hemiplegia and hemiparesis following cerebral infarction affecting right dominant side: Secondary | ICD-10-CM

## 2024-03-10 DIAGNOSIS — M6281 Muscle weakness (generalized): Secondary | ICD-10-CM

## 2024-03-10 NOTE — Therapy (Addendum)
 OUTPATIENT SPEECH LANGUAGE PATHOLOGY  COGNITIVE-COMMUNICATION TREATMENT   Patient Name: Haley Jones MRN: [id number] DOB:1956-08-01, 67 y.o., female Today's Date: 03/10/2024  PCP: Cleotilde Oneil FALCON, MD  REFERRING PROVIDER: Pegge Toribio PARAS, PA-C    End of Session - 03/10/24 1320     Visit Number 11    Number of Visits 25    Date for SLP Re-Evaluation 04/27/24    Authorization Type [employer]    Authorization Time Period 01/28/2024 thru 04/27/2024    Authorization - Visit Number 11    Authorization - Number of Visits 24    Progress Note Due on Visit 20    SLP Start Time 1315    SLP Stop Time  1400    SLP Time Calculation (min) 45 min    Activity Tolerance Patient tolerated treatment well          Past Medical History:  Diagnosis Date   Adnexal mass    Anxiety    Chronic back pain    Depression    Diabetes mellitus without complication (HCC)    Endometriosis 03/12/2018   GERD (gastroesophageal reflux disease)    Heart murmur 02/2018   undetected until she was an adult. 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   Weight loss 12/06/2017   Past Surgical History:  Procedure Laterality Date   ABDOMINAL HYSTERECTOMY  2008   ovaries also   CESAREAN SECTION  1994   COLONOSCOPY     COLONOSCOPY WITH PROPOFOL  N/A 02/12/2018   Procedure: COLONOSCOPY WITH PROPOFOL ;  Surgeon: Toledo, Ladell POUR, MD;  Location: [facility];  Service: Gastroenterology;  Laterality: N/A;   ESOPHAGOGASTRODUODENOSCOPY (EGD) WITH PROPOFOL  N/A 02/12/2018   Procedure: ESOPHAGOGASTRODUODENOSCOPY (EGD) WITH PROPOFOL ;  Surgeon: Toledo, Ladell POUR, MD;  Location: [facility];  Service: Gastroenterology;  Laterality: N/A;   EYE SURGERY Left 1973   muscle shortening to straighten cross eyes   LAPAROSCOPY N/A 02/20/2018   Procedure: LAPAROSCOPY DIAGNOSTIC, ( RESECTION OF RIGHT LOWER QUADRANT ABDOMINAL MASS);   Surgeon: Nicholaus Selinda Birmingham, MD;  Location: [facility] ORS;  Service: General;  Laterality: N/A;   ROTATOR CUFF REPAIR Left 2011   Patient Active Problem List   Diagnosis Date Noted   Urinary incontinence as sequela of cerebrovascular accident (CVA) 02/11/2024   Depression with anxiety 01/04/2024   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   Acute CVA (cerebrovascular accident) (HCC) 12/20/2023   Primary hypertension 12/20/2023   Acute kidney injury superimposed on chronic kidney disease (HCC) 12/20/2023   DM type 2 with diabetic mixed hyperlipidemia (HCC) 11/17/2022   Major depressive disorder, recurrent, 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/17/2022   B12 deficiency 02/23/2021   SBO (small bowel obstruction) (HCC) 01/29/2019   Barrett's esophagus without dysplasia 04/15/2018   Weight loss 12/06/2017   Hoarse 12/06/2017   Lumbar disc disease 07/26/2017   Diabetes mellitus type 2, controlled, without complications (HCC) 02/09/2015   Surgical menopause 11/26/2014    ONSET DATE: 12/20/23   REFERRING DIAG: CVA  THERAPY DIAG:  Cognitive communication deficit  Rationale for Evaluation and Treatment Rehabilitation  SUBJECTIVE:   PERTINENT HISTORY: Haley Jones is a 68 year old left-handed female who presented to [facility] on 12/20/23 with right side weakness/falls and dizziness. MRI of the brain showed patchy acute cortical/subcortical infarcts within the left parietal occipital lobes individually measuring up to 3 cm (MCA vascular territory). Subtle petechial hemorrhage associated with some of these infarcts.  No frank hemorrhagic conversion.  Additional punctate left MCA territory cortical acute infarct within the left insula.  Background parenchymal atrophy and chronic small vessel ischemic disease with chronic lacunar infarcts.  CT angio showed severe stenosis/nonocclusive thrombus of the left MCA bifurcation affecting the inferior division.  Nonocclusive embolus also visible within the left parietal branch as  well. Echocardiogram ejection fraction of 60 to 65% no wall motion abnormalities grade 1 diastolic dysfunction. Past medical history also noted for hypertension, B12 deficiency, type 2 diabetes mellitus, hyperlipidemia, CKD stage III, depression.  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 D/c home after CIR admission 12/28/23-01/18/24. Sister lives nearby and checks on patient throughout the day, assists with meals, bathing, and medications.  DIAGNOSTIC FINDINGS: see above  PAIN:  Are you having pain? No   FALLS: Has patient fallen in last 6 months?  Yes, Number of falls: 1 since returning home from rehab, multiple prior to hospitalization  LIVING ENVIRONMENT: Lives with: lives alone Lives in: House/apartment  PLOF:  Level of assistance: Independent with IADLs Employment: Retired   PATIENT GOALS   Pt states she wants to drive again  SUBJECTIVE STATEMENT: Pt alert, cooperative. Talkative,  Pt accompanied by: self  OBJECTIVE:   TODAY'S TREATMENT: Skilled treatment session focused on pt's cognitive communication goals. SLP facilitated session by providing the following interventions:  TRAIL MAKING TEST (TMT) Parts A & B Both parts of the Trail Making Test consist of 25 circles distributed over a sheet of paper. In Part A, the circles are numbered 1 - 25, and the patient should draw lines to connect the numbers in ascending order. In Part B, the circles include both numbers (1 - 13) and letters (A - L); as in Part A, the patient draws lines to connect the circles in an ascending pattern, but with the added task of alternating between the numbers and letters (i.e., 1-A-2-B-3-C, etc.).   Results for both TMT A and B are reported as the number of seconds required to complete the task; therefore, higher scores reveal greater impairment.  Results:  Trail A - WNL - 34 seconds (n=29 seconds; Deficient > 78  seconds) Trail B - DEFICIENT - 270 seconds (n= 75 seconds; Deficient > 273 seconds)  TMTs have been shown to be significantly correlated with impaired driving on road tests by older drivers. TMT-A provided the best utility for determining a range of scores (68-90 sec) for which additional road testing would be indicated in general practice settings.(Pap[place]atos GD, Layla CAVES, [address], [place], [place]. Clinical Utility of the Trail-Making Test as a Predictor of Driving Performance in Older Adults. J Am Geriatr Soc. 2015 Nov;63(11):2358-64. doi: 10.1111/jgs.13776. Epub 2015 Oct 27. PMID: 73496376; PMCID: [id number].)    Pt benefited from moderate cues for sequence, working memory, visual scanning with increased difficutly locating stimulus to her right.  Constant Therapy Clinician App utilized to target alternating attention Find alternating symbols: Level 1 maximal multimodal assistance faded to visual pointing unable to fade visual pointing to alternate at achieve 75% Remember pictures in order (N-Back) Level 1: 89% with Min A   PATIENT EDUCATION: Education details: as above Person educated: Patient Education method: Verbal cues Education comprehension: verbalized understanding and needs further education   HOME EXERCISE PROGRAM:   See above  GOALS:  Goals reviewed with patient? Yes  SHORT TERM GOALS: Target date: 10 sessions  Updated 03/05/2024 The patient will sustain attention in simple cognitive linguistic task for 5 minutes given min verbal cues.  Baseline: Goal status: INITIAL: MET: upgraded to With Min A, pt will demonstrate alternating attention between 2 basic tasks in 5 out of 7 opportunities.    2.  Patient will reduce impulsivity by preplanning steps before completing a task with moderate verbal cues. Baseline:  Goal status: INITIAL: MET   3.  Patient will complete Patient Reported Outcome Measure to assess self-perception of deficits.  Baseline:   Goal status: INITIAL: MET   4.  With Min A, patient will complete complex visual-spatial activities with 75% accuracy.               Baseline:               Goal status: INITIAL   5. With Mod I, pt will recall and follow therapy schedule with 80% accuracy.               Baseline:              Goal status: INITIAL         LONG TERM GOALS: Target date: 04/27/2024             Updated 03/05/2024 Patient will alternate attention between two moderately complex tasks 80% acc, with min verbal cues.  Baseline:  Goal status: INITIAL: great progress made   2.  Patient will demonstrate emergent awareness by identifying 75% of errors with min cues for double checking. Baseline:  Goal status: INITIAL: great progress made   3.  Patient will recall safety precautions/mobility recommendations using visual aids if necessary to reduce risk for falls. Baseline:  Goal status: INITIAL: great progress made   ASSESSMENT:  CLINICAL IMPRESSION: Based on recent assessment, Haley Jones presents with overall moderate cognitive communication impairment per standardized testing using the Cognitive Linguistic Quick Test. Primary deficit areas (severe) include attention, executive function, and visuospatial skills. Patient's impulsivity contributed significantly to testing errors, as did poor sustained attention to testing instructions. Impulsivity impacting safety at home, contributing to fall at home. Patient and sister report reduced frustration tolerance. She is often frustrated when being given instructions. Patient demonstrated some emergent awareness of errors on tasks such as mazes, but was unaware of perseverations in design generation and generative naming, and errors in clock drawing, symbol cancellation, and symbol trails tasks.   Pt eager throughout session to target higher level cognitive skills in the hopes of returning to driving. See the above treatment note for details on progress.   OBJECTIVE  IMPAIRMENTS include attention, memory, awareness, and executive functioning. These impairments are limiting patient from managing medications, managing appointments, managing finances, household responsibilities, ADLs/IADLs, and effectively communicating at home and in community. Factors affecting potential to achieve goals and functional outcome are ability to learn/carryover information and severity of impairments. Patient will benefit from skilled SLP services to address above impairments and improve overall function.  REHAB POTENTIAL: Good  PLAN: SLP FREQUENCY: 1-2x/week  SLP DURATION: 12 weeks  PLANNED INTERVENTIONS: Environmental controls, Cueing hierachy, Cognitive reorganization, Internal/external aids, Functional tasks, SLP instruction and feedback, Compensatory strategies, Patient/family education, and 07492 Treatment of speech (30 or 45 min)    Josiel Gahm B. Rubbie, M.S., CCC-SLP, Tree surgeon Certified Brain Injury Specialist [facility]  [facility] Rehabilitation Services Office [phone] Ascom [phone] Fax [phone]

## 2024-03-10 NOTE — Therapy (Signed)
 OUTPATIENT PHYSICAL THERAPY TREATMENT   Patient Name: Haley Jones MRN: [id number] DOB:05-Mar-1956, 68 y.o., female Today's Date: 03/10/2024  PCP: Dr. Oneil Pinal REFERRING PROVIDER: Toribio Pitch, PA-C  END OF SESSION:   PT End of Session - 03/10/24 1323     Visit Number 13    Number of Visits 24    Date for PT Re-Evaluation 04/21/24    Progress Note Due on Visit 20    PT Start Time 1400    PT Stop Time 1441    PT Time Calculation (min) 41 min    Equipment Utilized During Treatment Gait belt    Activity Tolerance Patient tolerated treatment well    Behavior During Therapy WFL for tasks assessed/performed             Past Medical History:  Diagnosis Date   Adnexal mass    Anxiety    Chronic back pain    Depression    Diabetes mellitus without complication (HCC)    Endometriosis 03/12/2018   GERD (gastroesophageal reflux disease)    Heart murmur 02/2018   undetected until she was an adult. 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   Weight loss 12/06/2017   Past Surgical History:  Procedure Laterality Date   ABDOMINAL HYSTERECTOMY  2008   ovaries also   CESAREAN SECTION  1994   COLONOSCOPY     COLONOSCOPY WITH PROPOFOL  N/A 02/12/2018   Procedure: COLONOSCOPY WITH PROPOFOL ;  Surgeon: Toledo, Ladell POUR, MD;  Location: [facility];  Service: Gastroenterology;  Laterality: N/A;   ESOPHAGOGASTRODUODENOSCOPY (EGD) WITH PROPOFOL  N/A 02/12/2018   Procedure: ESOPHAGOGASTRODUODENOSCOPY (EGD) WITH PROPOFOL ;  Surgeon: Toledo, Ladell POUR, MD;  Location: [facility];  Service: Gastroenterology;  Laterality: N/A;   EYE SURGERY Left 1973   muscle shortening to straighten cross eyes   LAPAROSCOPY N/A 02/20/2018   Procedure: LAPAROSCOPY DIAGNOSTIC, ( RESECTION OF RIGHT LOWER QUADRANT ABDOMINAL MASS);  Surgeon: Nicholaus Selinda Birmingham, MD;  Location: [facility];  Service: General;  Laterality:  N/A;   ROTATOR CUFF REPAIR Left 2011   Patient Active Problem List   Diagnosis Date Noted   Urinary incontinence as sequela of cerebrovascular accident (CVA) 02/11/2024   Depression with anxiety 01/04/2024   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   Acute CVA (cerebrovascular accident) (HCC) 12/20/2023   Primary hypertension 12/20/2023   Acute kidney injury superimposed on chronic kidney disease (HCC) 12/20/2023   DM type 2 with diabetic mixed hyperlipidemia (HCC) 11/17/2022   Major depressive disorder, recurrent, 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/17/2022   B12 deficiency 02/23/2021   SBO (small bowel obstruction) (HCC) 01/29/2019   Barrett's esophagus without dysplasia 04/15/2018   Weight loss 12/06/2017   Hoarse 12/06/2017   Lumbar disc disease 07/26/2017   Diabetes mellitus type 2, controlled, without complications (HCC) 02/09/2015   Surgical menopause 11/26/2014    ONSET DATE: 12/20/2023  REFERRING DIAG: P36.487 (ICD-10-CM) - Left middle cerebral artery stroke (HCC)   THERAPY DIAG:  Muscle weakness (generalized)  Other lack of coordination  Abnormality of gait  Hemiplegia and hemiparesis following cerebral infarction affecting right dominant side (HCC)  Unsteadiness on feet  Rationale for Evaluation and Treatment: Rehabilitation  SUBJECTIVE:  SUBJECTIVE STATEMENT:   Denies any falls since last session. No new changes currently.   PERTINENT HISTORY:Pt states she is not sure the exact date of when her CVA happened because she kept falling at home trying to care for herself and her daughter, but she finally went to the hospital when her sister insisted on it. Pt states her special needs daughter, [name], passed away recently while pt was in the hospital at [facility] before she was transferred to [facility] for stroke rehabilitation.Pt's sister states pt requires supervision/cuing for ADLs  to don clothes correctly, with correct orientation (potential apraxia?) Pt/sister report pt has R side inattention.Pt reports supposedly one of her legs is shorter than the other, believes her R LE might be the shorter one. 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     PAIN:  Are you having pain? No  PRECAUTIONS: Fall RED FLAGS: None  WEIGHT BEARING RESTRICTIONS: No FALLS: Has patient fallen in last 6 months? Yes. Number of falls 6+ before going to hospital and 1 fall since being home from CIR - pt states she was trying to gather her clothes by herself rather than waiting for her sister to get there to help her  LIVING ENVIRONMENT Lives with: lives alone and but pt's sister, [name], comes over daily to assist with ADLs and stays to provide supervision all day - pt states at night she would be able to walk to bathroom using RW if needed (but she wears depends in event of incontinence) Lives in: House/apartment Stairs: she has steps, but she doesn't have to use them (house was wheelchair accessible for her daughter)  Has following equipment at home: Environmental consultant - 2 wheeled, shower chair, Grab bars, Ramped entry, and transport chair  PLOF: Independent, Independent with household mobility without device, Independent with homemaking with ambulation, Independent with gait, Independent with transfers, and she was the primary caregiver for her recently deceased daughter   CLOF: Sister provides supervision daily for pt to ambulate using RW safely and provides assist for ADLs (showering and dressing - progressing towards supervision with these activities); Sister states pt requires cues to turn and step back fully prior to sitting to ensure her safety  PATIENT GOALS: get back to being able to go shopping with improved community level ambulation without the  Alvy [place]sop; return to driving   OBJECTIVE:  Note: Objective measures were completed at Evaluation unless otherwise noted.  DIAGNOSTIC FINDINGS:   EXAM: MRI HEAD WITHOUT CONTRAST   TECHNIQUE: Multiplanar, multiecho pulse sequences of the brain and surrounding structures were obtained without intravenous contrast.   COMPARISON:  Brain MRI 07/31/2022.  IMPRESSION: 1. Patchy acute cortical/subcortical infarcts within the left parietal and occipital lobes individually measuring up to 3 cm (MCA vascular territory). Subtle petechial hemorrhage associated with some of these infarcts. No frank hemorrhagic conversion. 2. Additional punctate left MCA territory cortical acute infarct within the left insula. 3. Background parenchymal atrophy and chronic small vessel ischemic disease with chronic lacunar infarcts, as described. 4. Paranasal sinus disease as outlined (including severe right maxillary sinusitis).   Electronically Signed   By: Rockey Childs D.O.   On: 12/20/2023 11:52  COGNITION: Overall cognitive status: Within functional limits for tasks assessed   SENSATION: Light touch: initially misses the first 1-2 touches on R LE, but then able to feel remaining touches Proprioception: Impaired grossly  COORDINATION: Symmetrical heel-to-shin bilaterally  EDEMA:  Not formally assessed, but  none observed  MUSCLE TONE: Not formally assessed  POSTURE: rounded shoulders, forward head, and posterior pelvic tilt  LOWER EXTREMITY MMT:    MMT Right Eval Left Eval  Hip flexion 3+, no pain 3+ with some L hip and back pain with this  Hip extension    Hip abduction    Hip adduction    Hip internal rotation    Hip external rotation    Knee flexion 3+ 4  Knee extension 4- 4  Ankle dorsiflexion 3+ 4  Ankle plantarflexion 4- 4  Ankle inversion    Ankle eversion    (Blank rows = not tested)  BED MOBILITY:  Findings: Sit to supine need to assess Supine to sit need to assess *pt  reports some difficulty with bed mobility  TRANSFERS: Sit to stand: SBA  Assistive device utilized: Environmental consultant - 2 wheeled     Stand to sit: SBA  Assistive device utilized: Environmental consultant - 2 wheeled     Chair to chair: SBA  Assistive device utilized: Environmental consultant - 2 wheeled       GAIT: Findings: Gait Characteristics: slight R ankle instability noted, step through pattern, decreased stance time- Right, decreased stride length, and decreased ankle dorsiflexion- Right, Distance walked: ~134ft, Assistive device utilized:Ilina Xu - 2 wheeled, Level of assistance: SBA and CGA, and Comments:    FUNCTIONAL TESTS:  5 times sit to stand: 38.75 seconds, using UE support 10 meter walk test: 0.48 m/s using youth RW, requires CGA for safety 6 minute walk test: Visit 2, see note Berg Balance Scale: Visit 2, see note Functional gait assessment: need to assess, when appropriate  PATIENT SURVEYS:  ABC scale 7.5%                                                                                                                             TREATMENT DATE: 03/10/2024  Unless otherwise stated, CGA was provided and gait belt donned in order to ensure pt safety    TA- To improve functional movements patterns for everyday tasks     Sit to stand, gait x 15 ft no AD then Stand to sit repeat x 5  -cues for eccentric control and for proper positioning on stand to sit portion  Sit to stand, gait x 15 ft no AD then Stand to sit repeat x 3 - R lateral LOB requiring maxA to prevent fall Standing marching with 2.5# AW x 15 each LE with B UE support Sidestepping x 25' total (~12.5' to L, ~12.5' to R) with 2.5# AW  Gait no AD 2 x 25' holding  ice cream cone in L UE - 2.5# AW on each LE  Sit to stand 2 x 10 with no UE support  Resisted gait x 300' with 2.5# AW with RW  Standing hip extension with 2.5# AW x 15 each LE   PATIENT EDUCATION: Education details: PT POC, findings on assessment today, recommendation to continue using RW for  all functional mobility Person educated: Patient and Caregiver [name] Education method: Explanation Education comprehension: verbalized understanding and needs further education  HOME EXERCISE PROGRAM: Access Code: [email or web address] URL: [email or web address] Date: 02/04/2024 Prepared by: Peggye Linear  Exercises - Standing March with Counter Support  - 2 x daily - 7 x weekly - 2 sets - 20 reps - Side stepping with counter support: yellow band loop  - 2 x daily - 7 x weekly - 2 sets - 20 reps - Sit to Stand with Counter Support  - 2 x daily - 7 x weekly - 2 sets - 12 reps   GOALS: Goals reviewed with patient? Yes  SHORT TERM GOALS: Target date: 03/10/2024  Pt will be independent with HEP in order to improve strength and balance in order to decrease fall risk and improve function at home and work.  Baseline: need to initiate Goal status:  ONGOING: 7/16: 2-3x week  LONG TERM GOALS: Target date: 04/21/2024  1.  Patient will complete five times sit to stand test in < 15 seconds indicating an increased LE strength and improved balance. Baseline:  38.75 seconds, using UE support, 7/16: 20.35 sec, UE support on 1st STS only Goal Status: ONGOING   2.  Patient will increase ABC scale score >80% to demonstrate better functional mobility and better confidence with ADLs.   Baseline: 7.5%, 7/16: 18.125% Goal status: INITIAL   3.  Patient will increase Berg Balance score by > 6 points to demonstrate decreased fall risk during functional activities. Baseline: need to assess; 01/30/2024= 34/56, 7/16= 39 Goal status: INITIAL   4. Patient will increase 10 meter walk test to >1.60m/s as to improve gait speed for better community ambulation and to reduce fall risk. Baseline: 0.48 m/s using youth RW, requires CGA for safety, 7/16: 0.69 m/s  Goal status: INITIAL  5. Patient will increase six minute walk test distance to >1000 for progression to community ambulator and improve gait  ability Baseline: need to assess; 01/30/2024=410 feet using RW; 02/13/24: 474ft in 56m30s, 7/16: 678ft using 2WW Goal status: ONGOING   ASSESSMENT:  CLINICAL IMPRESSION:   Patient arrived with good motivation for completion of pt activities.  Session focused on BLE strengthening and endurance. Continues to have difficulty with dual tasking with ambulation. Experienced one R lateral LOB requiring maxA to prevent fall. Pt will continue to benefit from skilled physical therapy intervention to address impairments, improve QOL, and attain therapy goals.     OBJECTIVE IMPAIRMENTS: Abnormal gait, decreased activity tolerance, decreased balance, decreased endurance, decreased knowledge of use of DME, decreased mobility, difficulty walking, decreased strength, decreased safety awareness, impaired vision/preception, and pain.   ACTIVITY LIMITATIONS: carrying, lifting, bending, standing, squatting, stairs, transfers, bed mobility, bathing, toileting, dressing, reach over head, hygiene/grooming, and locomotion level  PARTICIPATION LIMITATIONS: meal prep, cleaning, laundry, driving, shopping, and community activity  PERSONAL FACTORS: Age, Time since onset of injury/illness/exacerbation, and 3+ comorbidities: Hypertension, B12 deficiency, type 2 diabetes, hyperlipidemia, CKD stage IIIa are also affecting patient's functional outcome.   REHAB POTENTIAL: Good  CLINICAL DECISION MAKING: Evolving/moderate complexity  EVALUATION COMPLEXITY: Moderate  PLAN:  PT FREQUENCY: 1-2x/week  PT DURATION: 12 weeks  PLANNED INTERVENTIONS: 97164- PT Re-evaluation, 97750- Physical Performance Testing, 97110-Therapeutic exercises, 97530- Therapeutic activity, [id number]- Neuromuscular re-education, 97535- Self Care, [id number]- Manual therapy, [id number]- Gait training, [phone]- Orthotic Initial, [phone]- Orthotic/Prosthetic subsequent, [phone]- Canalith repositioning, [phone]- Electrical stimulation (manual), Patient/Family education, Balance  training, Stair training, Taping, Joint mobilization, Spinal mobilization, Vestibular training, Visual/preceptual remediation/compensation, DME instructions,  Cryotherapy, Moist heat, and Biofeedback  PLAN FOR NEXT SESSION:  - FGA when appropriate and add LTG - floor transfers training - continue HIIT - continue gait training without AD for balance and righting (include multi plane, multidirectional)  - dynamic reaching and dynamic stepping balance    Maryanne Finder, PT, DPT Physical Therapist - [place]  [facility]  1:24 PM, 03/10/24

## 2024-03-10 NOTE — Therapy (Unsigned)
 Occupational Therapy Treatment Note   Patient Name: Haley Jones MRN: [id number] DOB:29-Dec-1955, 68 y.o., female Today's Date: 03/11/2024  PCP: Cleotilde Oneil FALCON, MD REFERRING PROVIDER: Pegge Toribio PARAS, PA-C   OT End of Session - 03/11/24 0802     Visit Number 12    Number of Visits 24    Date for OT Re-Evaluation 04/22/24    OT Start Time 1445    OT Stop Time 1530    OT Time Calculation (min) 45 min    Activity Tolerance Patient tolerated treatment well    Behavior During Therapy [facility] for tasks assessed/performed               Past Medical History:  Diagnosis Date   Adnexal mass    Anxiety    Chronic back pain    Depression    Diabetes mellitus without complication (HCC)    Endometriosis 03/12/2018   GERD (gastroesophageal reflux disease)    Heart murmur 02/2018   undetected until she was an adult. 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   Weight loss 12/06/2017   Past Surgical History:  Procedure Laterality Date   ABDOMINAL HYSTERECTOMY  2008   ovaries also   CESAREAN SECTION  1994   COLONOSCOPY     COLONOSCOPY WITH PROPOFOL  N/A 02/12/2018   Procedure: COLONOSCOPY WITH PROPOFOL ;  Surgeon: Toledo, Ladell POUR, MD;  Location: [facility];  Service: Gastroenterology;  Laterality: N/A;   ESOPHAGOGASTRODUODENOSCOPY (EGD) WITH PROPOFOL  N/A 02/12/2018   Procedure: ESOPHAGOGASTRODUODENOSCOPY (EGD) WITH PROPOFOL ;  Surgeon: Toledo, Ladell POUR, MD;  Location: [facility];  Service: Gastroenterology;  Laterality: N/A;   EYE SURGERY Left 1973   muscle shortening to straighten cross eyes   LAPAROSCOPY N/A 02/20/2018   Procedure: LAPAROSCOPY DIAGNOSTIC, ( RESECTION OF RIGHT LOWER QUADRANT ABDOMINAL MASS);  Surgeon: Nicholaus Selinda Birmingham, MD;  Location: [facility] ORS;  Service: General;  Laterality: N/A;   ROTATOR CUFF REPAIR Left 2011   Patient Active Problem List   Diagnosis Date  Noted   Depression with anxiety 01/04/2024   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   Acute CVA (cerebrovascular accident) (HCC) 12/20/2023   Primary hypertension 12/20/2023   Acute kidney injury superimposed on chronic kidney disease (HCC) 12/20/2023   DM type 2 with diabetic mixed hyperlipidemia (HCC) 11/17/2022   Major depressive disorder, recurrent, 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  Rationale for Evaluation and Treatment: Rehabilitation  SUBJECTIVE:   SUBJECTIVE STATEMENT: Pt. Reports that she continues to have difficulty with handwriting, using her L hand.  Pt accompanied by: self and family member  PERTINENT HISTORY: Pt. Is a 68 yo female with hx of a L MCA CVA. Pt. Was admitted into ED on 12/20/2023 with an onset of RUE weakness, numbness dysmetria, and visual deficits. Upon assessment, it was concluded that Pt. Experienced multiple infarcts in the L Parietal/Occipital region. PMHx: Dizziness, Falls  PRECAUTIONS: None  WEIGHT BEARING RESTRICTIONS: No  PAIN:  Are you having pain?  03/10/2024: No pain today.  03/05/2024: No pain, 2-3/10 level of discomfort during use of green resisitve clips.    FALLS: Has patient fallen in last 6 months? Yes. Number of falls 10  LIVING ENVIRONMENT: Lives with: lives with their family and lives alone Lives in: House/apartment Stairs: No, Ramp Has following equipment at home: Walker - 2 wheeled, Shower bench, and bed side commode  PLOF: Independent  PATIENT GOALS: To be able to feed herself and hold a fork and knife better.    OBJECTIVE:  Note: Objective measures were completed at Evaluation unless otherwise noted.  HAND DOMINANCE: Left  ADLs: Overall ADLs:  Transfers/ambulation related to ADLs:  Eating: Independent  Grooming: independent UB Dressing: Pt. Has difficulty managing buttons LB Dressing: jean zippers are difficult to manipulate, hiking pants, and clothing negotiation are difficult. Toileting: Independent Bathing: Independent Tub Shower transfers: stepping into shower is difficult.  Equipment: Grab bars, shower bench, bedside commode  IADLs: Shopping: Does not currently do that/has not tried Light housekeeping: Does not currently do that/has not tried Meal Prep: Has not cooked in months, did not prior to CVA, sister provides meals. Community mobility: Relies on family, and friends Medication management: Assistance from sister to initiate medication management, she gives herself a shot for diabetes Financial management: Sister is assisting with monthly bill management Handwriting: Cursive form is 10% legible; Printed form is 90% legible  MOBILITY STATUS: Needs Assist: CGA and Hx of falls  POSTURE COMMENTS:  No Significant postural limitations Sitting balance: Good  ACTIVITY TOLERANCE: Activity tolerance: Good  FUNCTIONAL OUTCOME MEASURES: MAM-20: TBD  UPPER EXTREMITY ROM:    Active ROM Right eval Right 03/03/2024 Left Eval [facility]  Shoulder flexion 50(58) 61(74)   Shoulder abduction 43(48) 45(58)   Shoulder adduction     Shoulder extension     Shoulder internal rotation     Shoulder external rotation     Elbow flexion 118(131) 125(128)   Elbow extension -24(-22) -5(0)   Wrist flexion 20 (21) 42(48)   Wrist extension 30(31) 30(34)   Wrist ulnar deviation     Wrist radial deviation     Wrist pronation     Wrist supination     (Blank rows = not tested)  UPPER EXTREMITY MMT:     MMT Right eval Right 03/03/2024 Left eval  Shoulder flexion 2/5 2/5 WFL  Shoulder  abduction 2/5 2/5 [facility]  Shoulder adduction     Shoulder extension     Shoulder internal rotation     Shoulder external rotation     Middle trapezius     Lower trapezius     Elbow flexion 4-/5 4/5 WFL  Elbow extension 4-/5 4/5 WFL  Wrist flexion 2+/5 4-/5 WFL  Wrist extension 3-/5 4-/5 WFL  Wrist ulnar deviation     Wrist radial deviation     Wrist pronation     Wrist supination     (Blank rows = not tested)  HAND FUNCTION: Grip strength: Right: 21 lbs; Left: 14 lbs, Lateral pinch: Right: 7 lbs, Left: 4 lbs, and 3 point pinch: Right: 4 lbs, Left: 5 lbs  03/03/2024:  Grip strength: Right: 23#, Left: 28#, Lateral pinch: Right: 6#, Left: 5#, 3 point pinch: Right: 6#, Left: 3#  COORDINATION: 9 Hole Peg test: Right: 72 sec; Left: 40 sec  03/03/2024: 9 hole peg test: Right: 1 min 3 sec, Left: 36 sec  SENSATION: Light touch: Impaired  Proprioception: Impaired   EDEMA: none  MUSCLE TONE: RUE: Mild  COGNITION: Overall cognitive status: Impaired  VISION: Subjective report: Pt. Has not noticed the changes in vision but her sister has. Caregiver reports that Pt. Does not bring much attention to the R side.  Baseline vision:  Visual history:   VISION ASSESSMENT: TBD   PERCEPTION: TBD  PRAXIS: Impaired: Motor planning                                                                                                                           TREATMENT DATE: 03/10/2024   Therapeutic Activities:  -Facilitated BUE functional reaching tasks, manipulating 1.5 flat discs from slick tabletop surface in preparation to placing discs into a slotted target in multiple high and low planes.   Therapeutic Ex.:  -Pt. Performed 5 reps of RUE AAROM through diagonal  on an incline wedge using towel slides to increase AROM shoulder flexion for gentle stretching, in preparation for therapeutic activities.    Neuromuscular re-education:  -Pt. Performed BUE [facility] skills, using 1/8 beads to thread  onto resisitve horizontal dowel, alternating hands to stabilize one end of the dowel, while placing 1/8 beads with the other.  -Task was focused on thumb opposition in both hands to remove and place beads onto resisitve horizontal dowel.  -Facilitated BUE [facility] skills grasping 1/2 [place] pebbles, distributing and discarding pebbles into multiple shallow dishes to promote translatory movements, and crossing midline during functional reaching tasks -To further challenge the task, Pt. Discarding 1/2 [place] pebbles from the ulnar aspect of the hand, into shallow dishes.  PATIENT EDUCATION: Education details: Grip/Pinch strength, POC, ROM and BUE strength, BUE motor control, handwriting, pre-writing skills Person educated: Patient and Sister Education method: Explanation, Demonstration, Tactile cues, and Verbal cues Education comprehension: verbalized understanding and returned demonstration  HOME EXERCISE PROGRAM: Yellow theraputty HEP   GOALS: Goals reviewed with patient? Yes  SHORT TERM GOALS: Target date: 03/10/2024    Pt. Will be independent utilizing HEPs for hand strengthening exercises.  Baseline: Eval: No current HEP program Goal status: INITIAL   LONG TERM GOALS: Target date: 04/21/2024   Pt. Will improve BUE grip strength by 5# of force to be able to securely hold items. Baseline:  03/03/2024: R grip strength: 23#, L grip strength: 28#. Pt. has difficulty securely holding items in her hand. Eval: R grip strength: 21#, L grip strength: 14# Goal status: progressing, ongoing  2.  Pt. Will improve RUE [facility] skills by 3 sec. to be able to manipulate zippers/buttons. Baseline: 03/03/2024: 9 hole peg test: R side: 1 min 3 sec, L side: 36 sec. 9 hole peg test; R side: 72 secs, L side: 40 secs Goal status: progressing, ongoing  3.  Pt. Will increase R shoulder flex/shoulder ABD ROM by 10 degrees to perform ADLs/IADLs.  Baseline: 03/03/2024: R shoulder flex: 61(74), R shoulder ABD:  45(58) R shoulder flex: 50(58), R shoulder ABD: 43(48) Goal status: progressing, ongoing  4.  Pt. Will improve handwriting to 50% legible in cursive form, legibility to be able to send written  correspondence Baseline: 03/03/2024: 50% legible in cursive form, Eval: 10% legible in cursive form, 90% legible in printed form. Goal status: Continue  5.  Pt. Will improve BUE pinch strength by 2# of force to  be able to assist with hiking pants Baseline: 03/03/2024: Lateral Pinch: R: 6#, L: 5#, 3 pt pinch: R: 6#, L: 3#.  Eval: Lateral Pinch: 7, R 3 pt. Pinch: 4, L Lateral Pinch: 4, L 3 pt. Pinch: 5 Goal status: partially met  6.  Pt. Will increase R wrist extension/flexion by 5 degrees in anticipation of reaching hold of items for ADLs. Baseline: 03/03/2024: R wrist ext: 30 (34), R Wrist flex: 42(48) Eval: R Wrist ext: 20(21), R Wrist flex: 30(31) Goal status: Continue  ASSESSMENT:  CLINICAL IMPRESSION:  Pt. Tolerated  the treatment session well today. Pt. Continues to present with difficulty performing LUE [facility] skills while stabilizing resisitve dowel with R hand, and threading beads onto dowel with the L hand, However, has improved with performing thumb opposition using the L hand to thread beads onto resisitve dowel. Pt. Presents with improvement when performing translatory movements in both R and L hand, using [place] pebbles. Pt. Has difficulty with sustaining grip while discarding objects from the ulnar aspect of the R hand. Pt. continues to benefit from OT services to work on RUE ROM, RUE strengthening, bilateral grip strength, bilateral pinch strength, bilateral [facility] skills, and Right sided awareness and attention in order to improve functional independence of ADL/IADL tasks at home.   PERFORMANCE DEFICITS: in functional skills including ADLs, IADLs, coordination, dexterity, proprioception, sensation, tone, ROM, strength, pain, Fine motor control, Gross motor control, endurance, and UE functional  use, and psychosocial skills including environmental adaptation, habits, interpersonal interactions, and routines and behaviors.   IMPAIRMENTS: are limiting patient from ADLs, IADLs, rest and sleep, work, leisure, and social participation.   CO-MORBIDITIES: may have co-morbidities  that affects occupational performance. Patient will benefit from skilled OT to address above impairments and improve overall function.  MODIFICATION OR ASSISTANCE TO COMPLETE EVALUATION: Min-Moderate modification of tasks or assist with assess necessary to complete an evaluation.  OT OCCUPATIONAL PROFILE AND HISTORY: Detailed assessment: Review of records and additional review of physical, cognitive, psychosocial history related to current functional performance.  CLINICAL DECISION MAKING: Moderate - several treatment options, min-mod task modification necessary  REHAB POTENTIAL: Good  EVALUATION COMPLEXITY: Moderate    PLAN:  OT FREQUENCY: 2x/week  OT DURATION: 12 weeks  PLANNED INTERVENTIONS: 97535 self care/ADL training, 02889 therapeutic exercise, 97530 therapeutic activity, 97112 neuromuscular re-education, 97018 paraffin, 02989 moist heat, 97010 cryotherapy, 97034 contrast bath, 97032 electrical stimulation (manual), passive range of motion, visual/perceptual remediation/compensation, energy conservation, patient/family education, and DME and/or AE instructions  RECOMMENDED OTHER SERVICES: ST & PT  CONSULTED AND AGREED WITH PLAN OF CARE: Patient and family member/caregiver  PLAN FOR NEXT SESSION: treatment  Damien Nap, OTS  This entire session was performed under direct supervision and direction of a licensed therapist/therapist assistant . I have personally read, edited and approve of the note as written.   Richardson Otter, MS, OTR/L   03/11/2024, 8:05 AM

## 2024-03-12 ENCOUNTER — Ambulatory Visit: Admitting: Occupational Therapy

## 2024-03-12 ENCOUNTER — Ambulatory Visit: Admitting: Physical Therapy

## 2024-03-12 ENCOUNTER — Ambulatory Visit: Admitting: Speech Pathology

## 2024-03-12 DIAGNOSIS — R269 Unspecified abnormalities of gait and mobility: Secondary | ICD-10-CM

## 2024-03-12 DIAGNOSIS — R41841 Cognitive communication deficit: Secondary | ICD-10-CM

## 2024-03-12 DIAGNOSIS — M6281 Muscle weakness (generalized): Secondary | ICD-10-CM

## 2024-03-12 DIAGNOSIS — R2681 Unsteadiness on feet: Secondary | ICD-10-CM

## 2024-03-12 DIAGNOSIS — R278 Other lack of coordination: Secondary | ICD-10-CM

## 2024-03-12 NOTE — Therapy (Signed)
 OUTPATIENT PHYSICAL THERAPY TREATMENT   Patient Name: Haley Jones MRN: [id number] DOB:1956-02-24, 68 y.o., female Today's Date: 03/12/2024  PCP: Dr. Oneil Pinal REFERRING PROVIDER: Toribio Pitch, PA-C  END OF SESSION:   PT End of Session - 03/12/24 1410     Visit Number 14    Number of Visits 24    Date for PT Re-Evaluation 04/21/24    Progress Note Due on Visit 20    PT Start Time 1404    PT Stop Time 1443    PT Time Calculation (min) 39 min    Equipment Utilized During Treatment Gait belt    Activity Tolerance Patient tolerated treatment well    Behavior During Therapy WFL for tasks assessed/performed              Past Medical History:  Diagnosis Date   Adnexal mass    Anxiety    Chronic back pain    Depression    Diabetes mellitus without complication (HCC)    Endometriosis 03/12/2018   GERD (gastroesophageal reflux disease)    Heart murmur 02/2018   undetected until she was an adult. 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   Weight loss 12/06/2017   Past Surgical History:  Procedure Laterality Date   ABDOMINAL HYSTERECTOMY  2008   ovaries also   CESAREAN SECTION  1994   COLONOSCOPY     COLONOSCOPY WITH PROPOFOL  N/A 02/12/2018   Procedure: COLONOSCOPY WITH PROPOFOL ;  Surgeon: Toledo, Ladell POUR, MD;  Location: [facility];  Service: Gastroenterology;  Laterality: N/A;   ESOPHAGOGASTRODUODENOSCOPY (EGD) WITH PROPOFOL  N/A 02/12/2018   Procedure: ESOPHAGOGASTRODUODENOSCOPY (EGD) WITH PROPOFOL ;  Surgeon: Toledo, Ladell POUR, MD;  Location: [facility];  Service: Gastroenterology;  Laterality: N/A;   EYE SURGERY Left 1973   muscle shortening to straighten cross eyes   LAPAROSCOPY N/A 02/20/2018   Procedure: LAPAROSCOPY DIAGNOSTIC, ( RESECTION OF RIGHT LOWER QUADRANT ABDOMINAL MASS);  Surgeon: Nicholaus Selinda Birmingham, MD;  Location: [facility];  Service: General;   Laterality: N/A;   ROTATOR CUFF REPAIR Left 2011   Patient Active Problem List   Diagnosis Date Noted   Urinary incontinence as sequela of cerebrovascular accident (CVA) 02/11/2024   Depression with anxiety 01/04/2024   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   Acute CVA (cerebrovascular accident) (HCC) 12/20/2023   Primary hypertension 12/20/2023   Acute kidney injury superimposed on chronic kidney disease (HCC) 12/20/2023   DM type 2 with diabetic mixed hyperlipidemia (HCC) 11/17/2022   Major depressive disorder, recurrent, 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  Rationale for Evaluation and Treatment: Rehabilitation  SUBJECTIVE:  SUBJECTIVE STATEMENT:   Denies any falls since last session. Still a little weary of walking since near fall but was glad PT was able to prevent fall last session.   PERTINENT HISTORY:Pt states she is not sure the exact date of when her CVA happened because she kept falling at home trying to care for herself and her daughter, but she finally went to the hospital when her sister insisted on it. Pt states her special needs daughter, [name], passed away recently while pt was in the hospital at [facility] before she was transferred to [facility] for stroke rehabilitation.Pt's sister states pt requires supervision/cuing for ADLs   to don clothes correctly, with correct orientation (potential apraxia?) Pt/sister report pt has R side inattention.Pt reports supposedly one of her legs is shorter than the other, believes her R LE might be the shorter one. 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     PAIN:  Are you having pain? No  PRECAUTIONS: Fall RED FLAGS: None  WEIGHT BEARING RESTRICTIONS: No FALLS: Has patient fallen in last 6 months? Yes. Number of falls 6+ before going to hospital and 1 fall since being home from CIR - pt states she was trying to gather her clothes by herself rather than waiting for her sister to get there to help her  LIVING ENVIRONMENT Lives with: lives alone and but pt's sister, [name], comes over daily to assist with ADLs and stays to provide supervision all day - pt states at night she would be able to walk to bathroom using RW if needed (but she wears depends in event of incontinence) Lives in: House/apartment Stairs: she has steps, but she doesn't have to use them (house was wheelchair accessible for her daughter)  Has following equipment at home: Environmental consultant - 2 wheeled, shower chair, Grab bars, Ramped entry, and transport chair  PLOF: Independent, Independent with household mobility without device, Independent with homemaking with ambulation, Independent with gait, Independent with transfers, and she was the primary caregiver for her recently deceased daughter   CLOF: Sister provides supervision daily for pt to ambulate using RW safely and provides assist for ADLs (showering and dressing - progressing towards supervision with these activities); Sister states pt requires cues to turn and step back fully prior to sitting to ensure her safety  PATIENT GOALS: get back to being able to go shopping with improved community level ambulation without the walker; return to driving   OBJECTIVE:  Note: Objective measures were completed at Evaluation  unless otherwise noted.  DIAGNOSTIC FINDINGS:   EXAM: MRI HEAD WITHOUT CONTRAST   TECHNIQUE: Multiplanar, multiecho pulse sequences of the brain and surrounding structures were obtained without intravenous contrast.   COMPARISON:  Brain MRI 07/31/2022.  IMPRESSION: 1. Patchy acute cortical/subcortical infarcts within the left parietal and occipital lobes individually measuring up to 3 cm (MCA vascular territory). Subtle petechial hemorrhage associated with some of these infarcts. No frank hemorrhagic conversion. 2. Additional punctate left MCA territory cortical acute infarct within the left insula. 3. Background parenchymal atrophy and chronic small vessel ischemic disease with chronic lacunar infarcts, as described. 4. Paranasal sinus disease as outlined (including severe right maxillary sinusitis).   Electronically Signed   By: Rockey Childs D.O.   On: 12/20/2023 11:52  COGNITION: Overall cognitive status: Within functional limits for tasks assessed   SENSATION: Light touch: initially misses the first 1-2 touches on R LE, but then able to feel remaining  touches Proprioception: Impaired grossly  COORDINATION: Symmetrical heel-to-shin bilaterally  EDEMA:  Not formally assessed, but none observed  MUSCLE TONE: Not formally assessed  POSTURE: rounded shoulders, forward head, and posterior pelvic tilt  LOWER EXTREMITY MMT:    MMT Right Eval Left Eval  Hip flexion 3+, no pain 3+ with some L hip and back pain with this  Hip extension    Hip abduction    Hip adduction    Hip internal rotation    Hip external rotation    Knee flexion 3+ 4  Knee extension 4- 4  Ankle dorsiflexion 3+ 4  Ankle plantarflexion 4- 4  Ankle inversion    Ankle eversion    (Blank rows = not tested)  BED MOBILITY:  Findings: Sit to supine need to assess Supine to sit need to assess *pt reports some difficulty with bed mobility  TRANSFERS: Sit to stand: SBA  Assistive device  utilized: Environmental consultant - 2 wheeled     Stand to sit: SBA  Assistive device utilized: Environmental consultant - 2 wheeled     Chair to chair: SBA  Assistive device utilized: Environmental consultant - 2 wheeled       GAIT: Findings: Gait Characteristics: slight R ankle instability noted, step through pattern, decreased stance time- Right, decreased stride length, and decreased ankle dorsiflexion- Right, Distance walked: ~180ft, Assistive device utilized:Walker - 2 wheeled, Level of assistance: SBA and CGA, and Comments:    FUNCTIONAL TESTS:  5 times sit to stand: 38.75 seconds, using UE support 10 meter walk test: 0.48 m/s using youth RW, requires CGA for safety 6 minute walk test: Visit 2, see note Berg Balance Scale: Visit 2, see note Functional gait assessment: need to assess, when appropriate  PATIENT SURVEYS:  ABC scale 7.5%                                                                                                                             TREATMENT DATE: 03/12/2024  Unless otherwise stated, CGA was provided and gait belt donned in order to ensure pt safety    TA- To improve functional movements patterns for everyday tasks    Gait x 320 ft with 3# AW  STS x 10 no UE  Gait x 320 ft with 3# AW  STS x 10 no UE 4 way walking course 3# AW x 12 ft ea direction ( forward, side, retro, other side)  - rest than another set but completes 2 rounds next set  Standing high march 2 x 10 ea LE with 3# AW   TE- To improve strength, endurance, mobility, and function of specific targeted muscle groups or improve joint range of motion or improve muscle flexibility  Seated LAQ 2 x 10 ea LE with 3# AW   PATIENT EDUCATION: Education details: PT POC, findings on assessment today, recommendation to continue using RW for all functional mobility Person educated: Patient and Caregiver [name] Education method: Explanation Education comprehension: verbalized understanding and  needs further education  HOME EXERCISE PROGRAM: Access  Code: [email or web address] URL: [email or web address] Date: 02/04/2024 Prepared by: Peggye Linear  Exercises - Standing March with Counter Support  - 2 x daily - 7 x weekly - 2 sets - 20 reps - Side stepping with counter support: yellow band loop  - 2 x daily - 7 x weekly - 2 sets - 20 reps - Sit to Stand with Counter Support  - 2 x daily - 7 x weekly - 2 sets - 12 reps   GOALS: Goals reviewed with patient? Yes  SHORT TERM GOALS: Target date: 03/10/2024  Pt will be independent with HEP in order to improve strength and balance in order to decrease fall risk and improve function at home and work.  Baseline: need to initiate Goal status:  ONGOING: 7/16: 2-3x week  LONG TERM GOALS: Target date: 04/21/2024  1.  Patient will complete five times sit to stand test in < 15 seconds indicating an increased LE strength and improved balance. Baseline:  38.75 seconds, using UE support, 7/16: 20.35 sec, UE support on 1st STS only Goal Status: ONGOING   2.  Patient will increase ABC scale score >80% to demonstrate better functional mobility and better confidence with ADLs.   Baseline: 7.5%, 7/16: 18.125% Goal status: INITIAL   3.  Patient will increase Berg Balance score by > 6 points to demonstrate decreased fall risk during functional activities. Baseline: need to assess; 01/30/2024= 34/56, 7/16= 39 Goal status: INITIAL   4. Patient will increase 10 meter walk test to >1.29m/s as to improve gait speed for better community ambulation and to reduce fall risk. Baseline: 0.48 m/s using youth RW, requires CGA for safety, 7/16: 0.69 m/s  Goal status: INITIAL  5. Patient will increase six minute walk test distance to >1000 for progression to community ambulator and improve gait ability Baseline: need to assess; 01/30/2024=410 feet using RW; 02/13/24: 426ft in 47m30s, 7/16: 674ft using 2WW Goal status: ONGOING   ASSESSMENT:  CLINICAL IMPRESSION:    Patient arrived with good motivation for completion  of pt activities.  Session focused on BLE strengthening and endurance. No overt Lob this date, improved hand positioning with gait without AD.   Pt has small steps with poor balance with retro gait without assistance. Pt will continue to benefit from skilled physical therapy intervention to address impairments, improve QOL, and attain therapy goals.     OBJECTIVE IMPAIRMENTS: Abnormal gait, decreased activity tolerance, decreased balance, decreased endurance, decreased knowledge of use of DME, decreased mobility, difficulty walking, decreased strength, decreased safety awareness, impaired vision/preception, and pain.   ACTIVITY LIMITATIONS: carrying, lifting, bending, standing, squatting, stairs, transfers, bed mobility, bathing, toileting, dressing, reach over head, hygiene/grooming, and locomotion level  PARTICIPATION LIMITATIONS: meal prep, cleaning, laundry, driving, shopping, and community activity  PERSONAL FACTORS: Age, Time since onset of injury/illness/exacerbation, and 3+ comorbidities: Hypertension, B12 deficiency, type 2 diabetes, hyperlipidemia, CKD stage IIIa are also affecting patient's functional outcome.   REHAB POTENTIAL: Good  CLINICAL DECISION MAKING: Evolving/moderate complexity  EVALUATION COMPLEXITY: Moderate  PLAN:  PT FREQUENCY: 1-2x/week  PT DURATION: 12 weeks  PLANNED INTERVENTIONS: [id number]- PT Re-evaluation, 97750- Physical Performance Testing, 97110-Therapeutic exercises, 97530- Therapeutic activity, [id number]- Neuromuscular re-education, 97535- Self Care, [id number]- Manual therapy, [id number]- Gait training, [phone]- Orthotic Initial, [phone]- Orthotic/Prosthetic subsequent, [phone]- Canalith repositioning, [phone]- Electrical stimulation (manual), Patient/Family education, Balance training, Stair training, Taping, Joint mobilization, Spinal mobilization, Vestibular training, Visual/preceptual remediation/compensation, DME instructions, Cryotherapy, Moist heat, and Biofeedback  PLAN  FOR  NEXT SESSION:  - FGA when appropriate and add LTG - floor transfers training - continue HIIT - continue gait training without AD for balance and righting (include multi plane, multidirectional)  - dynamic reaching and dynamic stepping balance    Note: Portions of this document were prepared using Dragon voice recognition software and although reviewed may contain unintentional dictation errors in syntax, grammar, or spelling.  Lonni KATHEE Gainer PT ,DPT Physical Therapist- [place]  [facility]    2:10 PM, 03/12/24

## 2024-03-12 NOTE — Therapy (Addendum)
 OUTPATIENT SPEECH LANGUAGE PATHOLOGY  COGNITIVE-COMMUNICATION TREATMENT   Patient Name: Haley Jones MRN: [id number] DOB:24-Mar-1956, 68 y.o., female Today's Date: 03/12/2024  PCP: Cleotilde Oneil FALCON, MD  REFERRING PROVIDER: Pegge Toribio PARAS, PA-C    End of Session - 03/12/24 1330     Visit Number 12    Number of Visits 25    Date for SLP Re-Evaluation 04/27/24    Authorization Type [employer]    Authorization Time Period 01/28/2024 thru 04/27/2024    Authorization - Visit Number 12    Authorization - Number of Visits 24    Progress Note Due on Visit 20    SLP Start Time 1315    SLP Stop Time  1400    SLP Time Calculation (min) 45 min    Activity Tolerance Patient tolerated treatment well          Past Medical History:  Diagnosis Date   Adnexal mass    Anxiety    Chronic back pain    Depression    Diabetes mellitus without complication (HCC)    Endometriosis 03/12/2018   GERD (gastroesophageal reflux disease)    Heart murmur 02/2018   undetected until she was an adult. 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   Weight loss 12/06/2017   Past Surgical History:  Procedure Laterality Date   ABDOMINAL HYSTERECTOMY  2008   ovaries also   CESAREAN SECTION  1994   COLONOSCOPY     COLONOSCOPY WITH PROPOFOL  N/A 02/12/2018   Procedure: COLONOSCOPY WITH PROPOFOL ;  Surgeon: Toledo, Ladell POUR, MD;  Location: [facility];  Service: Gastroenterology;  Laterality: N/A;   ESOPHAGOGASTRODUODENOSCOPY (EGD) WITH PROPOFOL  N/A 02/12/2018   Procedure: ESOPHAGOGASTRODUODENOSCOPY (EGD) WITH PROPOFOL ;  Surgeon: Toledo, Ladell POUR, MD;  Location: [facility];  Service: Gastroenterology;  Laterality: N/A;   EYE SURGERY Left 1973   muscle shortening to straighten cross eyes   LAPAROSCOPY N/A 02/20/2018   Procedure: LAPAROSCOPY DIAGNOSTIC, ( RESECTION OF RIGHT LOWER QUADRANT ABDOMINAL MASS);   Surgeon: Nicholaus Selinda Birmingham, MD;  Location: [facility]S;  Service: General;  Laterality: N/A;   ROTATOR CUFF REPAIR Left 2011   Patient Active Problem List   Diagnosis Date Noted   Urinary incontinence as sequela of cerebrovascular accident (CVA) 02/11/2024   Depression with anxiety 01/04/2024   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   Acute CVA (cerebrovascular accident) (HCC) 12/20/2023   Primary hypertension 12/20/2023   Acute kidney injury superimposed on chronic kidney disease (HCC) 12/20/2023   DM type 2 with diabetic mixed hyperlipidemia (HCC) 11/17/2022   Major depressive disorder, recurrent, 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/17/2022   B12 deficiency 02/23/2021   SBO (small bowel obstruction) (HCC) 01/29/2019   Barrett's esophagus without dysplasia 04/15/2018   Weight loss 12/06/2017   Hoarse 12/06/2017   Lumbar disc disease 07/26/2017   Diabetes mellitus type 2, controlled, without complications (HCC) 02/09/2015   Surgical menopause 11/26/2014    ONSET DATE: 12/20/23   REFERRING DIAG: CVA  THERAPY DIAG:  Cognitive communication deficit  Rationale for Evaluation and Treatment Rehabilitation  SUBJECTIVE:   PERTINENT HISTORY: Haley Jones is a 68 year old left-handed female who presented to [facility] on 12/20/23 with right side weakness/falls and dizziness. MRI of the brain showed patchy acute cortical/subcortical infarcts within the left parietal occipital lobes individually measuring up to 3 cm (MCA vascular territory). Subtle petechial hemorrhage associated with some of these infarcts.  No frank hemorrhagic conversion.  Additional punctate left MCA territory cortical acute infarct within the left insula.  Background parenchymal atrophy and chronic small vessel ischemic disease with chronic lacunar infarcts.  CT angio showed severe stenosis/nonocclusive thrombus of the left MCA bifurcation affecting the inferior division.  Nonocclusive embolus also visible within the left parietal branch as  well. Echocardiogram ejection fraction of 60 to 65% no wall motion abnormalities grade 1 diastolic dysfunction. Past medical history also noted for hypertension, B12 deficiency, type 2 diabetes mellitus, hyperlipidemia, CKD stage III, depression.  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 D/c home after CIR admission 12/28/23-01/18/24. Sister lives nearby and checks on patient throughout the day, assists with meals, bathing, and medications.  DIAGNOSTIC FINDINGS: see above  PAIN:  Are you having pain? No   FALLS: Has patient fallen in last 6 months?  Yes, Number of falls: 66 since returning home from rehab, multiple prior to hospitalization  LIVING ENVIRONMENT: Lives with: lives alone Lives in: House/apartment  PLOF:  Level of assistance: Independent with IADLs Employment: Retired   PATIENT GOALS   Pt states she wants to drive again  SUBJECTIVE STATEMENT: Pt alert, cooperative. Talkative,  Pt accompanied by: self  OBJECTIVE:   TODAY'S TREATMENT: Skilled treatment session focused on pt's cognitive communication goals. SLP facilitated session by providing the following interventions:   Pt completed counting the number of times that a double digit number appeared in each row with 73% improved to 98% with cues to double check work and circle each instance.   Pt also completed worksheet in which she read the underlined letters out loud with 98% accuracy.   Constant Therapy Clinician App utilized Remember the picture in order (N-back) Level 1: 92% Remember the written word in order (N-back) Level 1: 91%  PATIENT EDUCATION: Education details: as above Person educated: Patient Education method: Verbal cues Education comprehension: verbalized understanding and needs further education   HOME EXERCISE PROGRAM:   See above  GOALS:  Goals reviewed with patient? Yes  SHORT TERM GOALS: Target date: 10  sessions            Updated 03/05/2024 The patient will sustain attention in simple cognitive linguistic task for 5 minutes given min verbal cues.  Baseline: Goal status: INITIAL: MET: upgraded to With Min A, pt will demonstrate alternating attention between 2 basic tasks in 5 out of 7 opportunities.    2.  Patient will reduce impulsivity by preplanning steps before completing a task with moderate verbal cues. Baseline:  Goal status: INITIAL: MET   3.  Patient will complete Patient Reported Outcome Measure to assess self-perception of deficits.  Baseline:  Goal status: INITIAL: MET   4.  With Min A, patient will complete complex visual-spatial activities with 75% accuracy.               Baseline:               Goal status: INITIAL   5. With Mod I, pt will recall and follow therapy schedule with 80% accuracy.               Baseline:              Goal status: INITIAL         LONG TERM GOALS: Target date: 04/27/2024             Updated 03/05/2024 Patient will alternate attention between  two moderately complex tasks 80% acc, with min verbal cues.  Baseline:  Goal status: INITIAL: great progress made   2.  Patient will demonstrate emergent awareness by identifying 75% of errors with min cues for double checking. Baseline:  Goal status: INITIAL: great progress made   3.  Patient will recall safety precautions/mobility recommendations using visual aids if necessary to reduce risk for falls. Baseline:  Goal status: INITIAL: great progress made   ASSESSMENT:  CLINICAL IMPRESSION: Based on recent assessment, Haley Jones presents with overall moderate cognitive communication impairment per standardized testing using the Cognitive Linguistic Quick Test. Primary deficit areas (severe) include attention, executive function, and visuospatial skills. Patient's impulsivity contributed significantly to testing errors, as did poor sustained attention to testing instructions. Impulsivity  impacting safety at home, contributing to fall at home. Patient and sister report reduced frustration tolerance. She is often frustrated when being given instructions. Patient demonstrated some emergent awareness of errors on tasks such as mazes, but was unaware of perseverations in design generation and generative naming, and errors in clock drawing, symbol cancellation, and symbol trails tasks.   Pt eager throughout session to target higher level cognitive skills in the hopes of returning to driving. See the above treatment note for details on progress.   OBJECTIVE IMPAIRMENTS include attention, memory, awareness, and executive functioning. These impairments are limiting patient from managing medications, managing appointments, managing finances, household responsibilities, ADLs/IADLs, and effectively communicating at home and in community. Factors affecting potential to achieve goals and functional outcome are ability to learn/carryover information and severity of impairments. Patient will benefit from skilled SLP services to address above impairments and improve overall function.  REHAB POTENTIAL: Good  PLAN: SLP FREQUENCY: 1-2x/week  SLP DURATION: 12 weeks  PLANNED INTERVENTIONS: Environmental controls, Cueing hierachy, Cognitive reorganization, Internal/external aids, Functional tasks, SLP instruction and feedback, Compensatory strategies, Patient/family education, and [id number] Treatment of speech (30 or 45 min)    Jodelle Fausto B. Rubbie, M.S., CCC-SLP, Tree surgeon Certified Brain Injury Specialist [facility]  [facility] Office [phone] Ascom [phone] Fax [phone]

## 2024-03-12 NOTE — Therapy (Unsigned)
 Occupational Therapy Treatment Note   Patient Name: Haley Jones MRN: [id number] DOB:1955-11-18, 68 y.o., female Today's Date: 03/12/2024  PCP: Cleotilde Oneil FALCON, MD REFERRING PROVIDER: Pegge Toribio PARAS, PA-C          Past Medical History:  Diagnosis Date   Adnexal mass    Anxiety    Chronic back pain    Depression    Diabetes mellitus without complication (HCC)    Endometriosis 03/12/2018   GERD (gastroesophageal reflux disease)    Heart murmur 02/2018   undetected until she was an adult. 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   Weight loss 12/06/2017   Past Surgical History:  Procedure Laterality Date   ABDOMINAL HYSTERECTOMY  2008   ovaries also   CESAREAN SECTION  1994   COLONOSCOPY     COLONOSCOPY WITH PROPOFOL  N/A 02/12/2018   Procedure: COLONOSCOPY WITH PROPOFOL ;  Surgeon: Toledo, Ladell POUR, MD;  Location: [facility];  Service: Gastroenterology;  Laterality: N/A;   ESOPHAGOGASTRODUODENOSCOPY (EGD) WITH PROPOFOL  N/A 02/12/2018   Procedure: ESOPHAGOGASTRODUODENOSCOPY (EGD) WITH PROPOFOL ;  Surgeon: Toledo, Ladell POUR, MD;  Location: [facility];  Service: Gastroenterology;  Laterality: N/A;   EYE SURGERY Left 1973   muscle shortening to straighten cross eyes   LAPAROSCOPY N/A 02/20/2018   Procedure: LAPAROSCOPY DIAGNOSTIC, ( RESECTION OF RIGHT LOWER QUADRANT ABDOMINAL MASS);  Surgeon: Nicholaus Selinda Birmingham, MD;  Location: [facility];  Service: General;  Laterality: N/A;   ROTATOR CUFF REPAIR Left 2011   Patient Active Problem List   Diagnosis Date Noted   Depression with anxiety 01/04/2024   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   Acute CVA (cerebrovascular accident) (HCC) 12/20/2023   Primary hypertension 12/20/2023   Acute kidney injury superimposed on chronic kidney disease (HCC) 12/20/2023   DM type 2 with diabetic mixed hyperlipidemia (HCC)  11/17/2022   Major depressive disorder, recurrent, 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  Rationale for Evaluation and Treatment: Rehabilitation  SUBJECTIVE:   SUBJECTIVE STATEMENT: Pt. Reports that she continues to have difficulty with handwriting, using her L hand.  Pt accompanied by: self and family member  PERTINENT HISTORY: Pt. Is a 68 yo female with hx of a L MCA CVA. Pt. Was admitted into ED on 12/20/2023 with an onset of RUE weakness, numbness dysmetria, and visual deficits. Upon assessment, it was concluded that Pt. Experienced multiple infarcts in the L Parietal/Occipital region. PMHx: Dizziness, Falls  PRECAUTIONS: None  WEIGHT BEARING RESTRICTIONS: No  PAIN:  Are you having pain?  03/12/2024: 6/10 pain during function reaching and grip strengthening task. 03/10/2024: No pain today. 03/05/2024: No pain, 2-3/10 level of discomfort during use of green resisitve clips.    FALLS: Has patient fallen in last 6 months? Yes. Number of falls 10  LIVING ENVIRONMENT: Lives with: lives with their family and lives alone Lives in: House/apartment Stairs: No, Ramp Has following equipment at home: Vannie - 2 wheeled, Shower bench, and bed side commode  PLOF: Independent  PATIENT GOALS:  To be able to feed herself and hold a fork and knife better.   OBJECTIVE:  Note: Objective measures were completed at Evaluation unless otherwise noted.  HAND DOMINANCE: Left  ADLs: Overall ADLs:  Transfers/ambulation related to ADLs:  Eating: Independent  Grooming: independent UB Dressing: Pt. Has  difficulty managing buttons LB Dressing: jean zippers are difficult to manipulate, hiking pants, and clothing negotiation are difficult. Toileting: Independent Bathing: Independent Tub Shower transfers: stepping into shower is difficult.  Equipment: Grab bars, shower bench, bedside commode  IADLs: Shopping: Does not currently do that/has not tried Light housekeeping: Does not currently do that/has not tried Meal Prep: Has not cooked in months, did not prior to CVA, sister provides meals. Community mobility: Relies on family, and friends Medication management: Assistance from sister to initiate medication management, she gives herself a shot for diabetes Financial management: Sister is assisting with monthly bill management Handwriting: Cursive form is 10% legible; Printed form is 90% legible  MOBILITY STATUS: Needs Assist: CGA and Hx of falls  POSTURE COMMENTS:  No Significant postural limitations Sitting balance: Good  ACTIVITY TOLERANCE: Activity tolerance: Good  FUNCTIONAL OUTCOME MEASURES: MAM-20: TBD  UPPER EXTREMITY ROM:    Active ROM Right eval Right 03/03/2024 Left Eval [facility]  Shoulder flexion 50(58) 61(74)   Shoulder abduction 43(48) 45(58)   Shoulder adduction     Shoulder extension     Shoulder internal rotation     Shoulder external rotation     Elbow flexion 118(131) 125(128)   Elbow extension -24(-22) -5(0)   Wrist flexion 20 (21) 42(48)   Wrist extension 30(31) 30(34)   Wrist ulnar deviation     Wrist radial deviation     Wrist pronation     Wrist supination     (Blank rows = not tested)  UPPER EXTREMITY MMT:     MMT Right eval Right 03/03/2024 Left eval  Shoulder flexion 2/5 2/5 WFL  Shoulder abduction 2/5 2/5 [facility]  Shoulder adduction     Shoulder extension     Shoulder internal rotation     Shoulder external rotation     Middle trapezius     Lower trapezius     Elbow flexion 4-/5 4/5 WFL  Elbow extension 4-/5 4/5 WFL  Wrist flexion 2+/5  4-/5 WFL  Wrist extension 3-/5 4-/5 WFL  Wrist ulnar deviation     Wrist radial deviation     Wrist pronation     Wrist supination     (Blank rows = not tested)  HAND FUNCTION: Grip strength: Right: 21 lbs; Left: 14 lbs, Lateral pinch: Right: 7 lbs, Left: 4 lbs, and 3 point pinch: Right: 4 lbs, Left: 5 lbs  03/03/2024:  Grip strength: Right: 23#, Left: 28#, Lateral pinch: Right: 6#, Left: 5#, 3 point pinch: Right: 6#, Left: 3#  COORDINATION: 9 Hole Peg test: Right: 72 sec; Left: 40 sec  03/03/2024: 9 hole peg test: Right: 1 min 3 sec, Left: 36 sec  SENSATION: Light touch: Impaired  Proprioception: Impaired   EDEMA: none  MUSCLE TONE: RUE: Mild  COGNITION: Overall cognitive status: Impaired  VISION: Subjective report: Pt. Has not noticed the changes in vision but her sister has. Caregiver reports that Pt. Does not bring much attention to the R side.  Baseline vision:  Visual history:   VISION ASSESSMENT: TBD   PERCEPTION: TBD  PRAXIS: Impaired: Motor planning  TREATMENT DATE: 03/12/2024   Therapeutic Activities:  -Pt. Tolerated BUE Progressive gross grip strengthening with  11.2# of grip strength resistive force to complete for 1st trial and tolerated 6.6# during 2nd trial of grip strengthening task. -Pt. Discarded jumbo pegs into container using gross gripper, in multiple planes to promote functional reaching and sustained grip.     Therapeutic Ex.:  -Pt. Performed alternating BUE using a pulley system to increase initial AAROM and PROM to end range.    Neuromuscular re-education:  -Facilitated FMC tasks using the Grooved pegboard, grasping and 1 grooved pegs from a horizontal position in the shallow dish, and transitioning them to a vertical position in preparation for placing them upright into the pegboard.   Self-Care:  -Pt. worked on  Careers information officer and speed. Pt. was able to write 4 lines in  3 minutes. Pt. used a mature hand grasp on the pen. Pt. was able to hold the pen for the entire exercise.   Letter spacing  line spacing  legibility      PATIENT EDUCATION: Education details: Grip/Pinch strength, POC, ROM and BUE strength, BUE motor control, handwriting, pre-writing skills Person educated: Patient and Sister Education method: Explanation, Demonstration, Tactile cues, and Verbal cues Education comprehension: verbalized understanding and returned demonstration  HOME EXERCISE PROGRAM: Yellow theraputty HEP   GOALS: Goals reviewed with patient? Yes  SHORT TERM GOALS: Target date: 03/10/2024    Pt. Will be independent utilizing HEPs for hand strengthening exercises.  Baseline: Eval: No current HEP program Goal status: INITIAL   LONG TERM GOALS: Target date: 04/21/2024   Pt. Will improve BUE grip strength by 5# of force to be able to securely hold items. Baseline:  03/03/2024: R grip strength: 23#, L grip strength: 28#. Pt. has difficulty securely holding items in her hand. Eval: R grip strength: 21#, L grip strength: 14# Goal status: progressing, ongoing  2.  Pt. Will improve RUE [facility] skills by 3 sec. to be able to manipulate zippers/buttons. Baseline: 03/03/2024: 9 hole peg test: R side: 1 min 3 sec, L side: 36 sec. 9 hole peg test; R side: 72 secs, L side: 40 secs Goal status: progressing, ongoing  3.  Pt. Will increase R shoulder flex/shoulder ABD ROM by 10 degrees to perform ADLs/IADLs.  Baseline: 03/03/2024: R shoulder flex: 61(74), R shoulder ABD: 45(58) R shoulder flex: 50(58), R shoulder ABD: 43(48) Goal status: progressing, ongoing  4.  Pt. Will improve handwriting to 50% legible in cursive form, legibility to be able to send written correspondence Baseline: 03/03/2024: 50% legible in cursive form, Eval: 10% legible in cursive form, 90% legible in printed form. Goal status: Continue  5.  Pt. Will  improve BUE pinch strength by 2# of force to  be able to assist with hiking pants Baseline: 03/03/2024: Lateral Pinch: R: 6#, L: 5#, 3 pt pinch: R: 6#, L: 3#.  Eval: Lateral Pinch: 7, R 3 pt. Pinch: 4, L Lateral Pinch: 4, L 3 pt. Pinch: 5 Goal status: partially met  6.  Pt. Will increase R wrist extension/flexion by 5 degrees in anticipation of reaching hold of items for ADLs. Baseline: 03/03/2024: R wrist ext: 30 (34), R Wrist flex: 42(48) Eval: R Wrist ext: 20(21), R Wrist flex: 30(31) Goal status: Continue  ASSESSMENT:  CLINICAL IMPRESSION:  Pt. Tolerated  the treatment session well today. Pt. Continues to present with difficulty performing LUE [facility] skills while stabilizing resisitve dowel with R hand, and threading beads onto dowel with the L hand,  However, has improved with performing thumb opposition using the L hand to thread beads onto resisitve dowel. Pt. Presents with improvement when performing translatory movements in both R and L hand, using [place] pebbles. Pt. Has difficulty with sustaining grip while discarding objects from the ulnar aspect of the R hand. Pt. continues to benefit from OT services to work on RUE ROM, RUE strengthening, bilateral grip strength, bilateral pinch strength, bilateral [facility] skills, and Right sided awareness and attention in order to improve functional independence of ADL/IADL tasks at home.   PERFORMANCE DEFICITS: in functional skills including ADLs, IADLs, coordination, dexterity, proprioception, sensation, tone, ROM, strength, pain, Fine motor control, Gross motor control, endurance, and UE functional use, and psychosocial skills including environmental adaptation, habits, interpersonal interactions, and routines and behaviors.   IMPAIRMENTS: are limiting patient from ADLs, IADLs, rest and sleep, work, leisure, and social participation.   CO-MORBIDITIES: may have co-morbidities  that affects occupational performance. Patient will benefit from skilled OT to  address above impairments and improve overall function.  MODIFICATION OR ASSISTANCE TO COMPLETE EVALUATION: Min-Moderate modification of tasks or assist with assess necessary to complete an evaluation.  OT OCCUPATIONAL PROFILE AND HISTORY: Detailed assessment: Review of records and additional review of physical, cognitive, psychosocial history related to current functional performance.  CLINICAL DECISION MAKING: Moderate - several treatment options, min-mod task modification necessary  REHAB POTENTIAL: Good  EVALUATION COMPLEXITY: Moderate    PLAN:  OT FREQUENCY: 2x/week  OT DURATION: 12 weeks  PLANNED INTERVENTIONS: 97535 self care/ADL training, 02889 therapeutic exercise, 97530 therapeutic activity, 97112 neuromuscular re-education, 97018 paraffin, 02989 moist heat, 97010 cryotherapy, 97034 contrast bath, 97032 electrical stimulation (manual), passive range of motion, visual/perceptual remediation/compensation, energy conservation, patient/family education, and DME and/or AE instructions  RECOMMENDED OTHER SERVICES: ST & PT  CONSULTED AND AGREED WITH PLAN OF CARE: Patient and family member/caregiver  PLAN FOR NEXT SESSION: treatment  Damien Nap, OTS  This entire session was performed under direct supervision and direction of a licensed therapist/therapist assistant . I have personally read, edited and approve of the note as written.   Richardson Otter, MS, OTR/L   03/12/2024, 5:24 PM

## 2024-03-13 NOTE — Therapy (Addendum)
 Occupational Therapy Treatment Note   Patient Name: Belkis Norbeck MRN: [id number] DOB:[birth year], 68 y.o., female Today's Date: 03/13/2024  PCP: Cleotilde Oneil FALCON, MD REFERRING PROVIDER: Pegge Toribio PARAS, PA-C   OT End of Session - 03/13/24 1501     Visit Number 13    Number of Visits 24    Date for OT Re-Evaluation 04/22/24    OT Start Time 1445    OT Stop Time 1530    OT Time Calculation (min) 45 min    Activity Tolerance Patient tolerated treatment well    Behavior During Therapy [facility] for tasks assessed/performed                Past Medical History:  Diagnosis Date   Adnexal mass    Anxiety    Chronic back pain    Depression    Diabetes mellitus without complication (HCC)    Endometriosis 03/12/2018   GERD (gastroesophageal reflux disease)    Heart murmur 02/2018   undetected until she was an adult. 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   Weight loss 12/06/2017   Past Surgical History:  Procedure Laterality Date   ABDOMINAL HYSTERECTOMY  2008   ovaries also   CESAREAN SECTION  1994   COLONOSCOPY     COLONOSCOPY WITH PROPOFOL  N/A 02/12/2018   Procedure: COLONOSCOPY WITH PROPOFOL ;  Surgeon: Toledo, Ladell POUR, MD;  Location: [facility] ENDOSCOPY;  Service: Gastroenterology;  Laterality: N/A;   ESOPHAGOGASTRODUODENOSCOPY (EGD) WITH PROPOFOL  N/A 02/12/2018   Procedure: ESOPHAGOGASTRODUODENOSCOPY (EGD) WITH PROPOFOL ;  Surgeon: Toledo, Ladell POUR, MD;  Location: [facility] ENDOSCOPY;  Service: Gastroenterology;  Laterality: N/A;   EYE SURGERY Left 1973   muscle shortening to straighten cross eyes   LAPAROSCOPY N/A 02/20/2018   Procedure: LAPAROSCOPY DIAGNOSTIC, ( RESECTION OF RIGHT LOWER QUADRANT ABDOMINAL MASS);  Surgeon: Nicholaus Selinda Birmingham, MD;  Location: [facility] ORS;  Service: General;  Laterality: N/A;   ROTATOR CUFF REPAIR Left 2011   Patient Active Problem List   Diagnosis Date  Noted   Depression with anxiety 01/04/2024   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   Acute CVA (cerebrovascular accident) (HCC) 12/20/2023   Primary hypertension 12/20/2023   Acute kidney injury superimposed on chronic kidney disease (HCC) 12/20/2023   DM type 2 with diabetic mixed hyperlipidemia (HCC) 11/17/2022   Major depressive disorder, recurrent, 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  Rationale for Evaluation and Treatment: Rehabilitation  SUBJECTIVE:   SUBJECTIVE STATEMENT: Pt. Reports that she was successful in tying her shoes when performing LB dressing at home.  Pt accompanied by: self and family member  PERTINENT HISTORY: Pt. Is a 68 yo female with hx of a L MCA CVA. Pt. Was admitted into ED on 12/20/2023 with an onset of RUE weakness, numbness dysmetria, and visual deficits. Upon assessment, it was concluded that Pt. Experienced multiple infarcts in the L Parietal/Occipital region. PMHx: Dizziness, Falls  PRECAUTIONS: None  WEIGHT BEARING RESTRICTIONS: No  PAIN:  Are you having pain?  03/12/2024:  6/10 pain at the R dorsal forearm during gross grip strengthening task. 03/10/2024: No pain today. 03/05/2024: No pain, 2-3/10 level of discomfort during use of green resisitve clips.    FALLS: Has patient fallen in last 6 months? Yes. Number of falls 10  LIVING ENVIRONMENT: Lives with: lives with their family and lives alone Lives in: House/apartment Stairs: No, Ramp Has following equipment at home: Walker - 2 wheeled, Shower bench, and bed side commode  PLOF:  Independent  PATIENT GOALS: To be able to feed herself and hold a fork and knife better.   OBJECTIVE:  Note: Objective measures were completed at Evaluation unless otherwise noted.  HAND DOMINANCE: Left  ADLs: Overall ADLs:  Transfers/ambulation related to ADLs:  Eating: Independent  Grooming: independent UB Dressing: Pt. Has difficulty managing buttons LB Dressing: jean zippers are difficult to manipulate, hiking pants, and clothing negotiation are difficult. Toileting: Independent Bathing: Independent Tub Shower transfers: stepping into shower is difficult.  Equipment: Grab bars, shower bench, bedside commode  IADLs: Shopping: Does not currently do that/has not tried Light housekeeping: Does not currently do that/has not tried Meal Prep: Has not cooked in months, did not prior to CVA, sister provides meals. Community mobility: Relies on family, and friends Medication management: Assistance from sister to initiate medication management, she gives herself a shot for diabetes Financial management: Sister is assisting with monthly bill management Handwriting: Cursive form is 10% legible; Printed form is 90% legible  MOBILITY STATUS: Needs Assist: CGA and Hx of falls  POSTURE COMMENTS:  No Significant postural limitations Sitting balance: Good  ACTIVITY TOLERANCE: Activity tolerance: Good  FUNCTIONAL OUTCOME MEASURES: MAM-20: TBD  UPPER EXTREMITY ROM:    Active ROM Right eval Right 03/03/2024 Left Eval [facility]  Shoulder flexion 50(58) 61(74)   Shoulder abduction 43(48) 45(58)   Shoulder adduction     Shoulder extension     Shoulder internal rotation     Shoulder external rotation     Elbow flexion 118(131) 125(128)   Elbow extension -24(-22) -5(0)   Wrist flexion 20 (21) 42(48)   Wrist extension 30(31) 30(34)   Wrist ulnar deviation     Wrist radial deviation     Wrist pronation     Wrist supination     (Blank rows = not tested)  UPPER EXTREMITY MMT:     MMT  Right eval Right 03/03/2024 Left eval  Shoulder flexion 2/5 2/5 WFL  Shoulder abduction 2/5 2/5 [facility]  Shoulder adduction     Shoulder extension     Shoulder internal rotation     Shoulder external rotation     Middle trapezius     Lower trapezius     Elbow flexion 4-/5 4/5 WFL  Elbow extension 4-/5 4/5 WFL  Wrist flexion 2+/5 4-/5 WFL  Wrist extension 3-/5 4-/5 WFL  Wrist ulnar deviation     Wrist radial deviation     Wrist pronation     Wrist supination     (Blank rows = not tested)  HAND FUNCTION: Grip strength: Right: 21 lbs; Left: 14 lbs, Lateral pinch: Right: 7 lbs, Left: 4 lbs, and 3 point pinch: Right: 4 lbs, Left: 5 lbs  03/03/2024:  Grip strength: Right: 23#, Left: 28#, Lateral pinch: Right: 6#, Left: 5#, 3 point pinch: Right: 6#, Left: 3#  COORDINATION: 9 Hole Peg test: Right: 72 sec; Left: 40 sec  03/03/2024: 9 hole peg test: Right: 1 min 3 sec, Left: 36 sec  SENSATION: Light touch: Impaired  Proprioception:  Impaired   EDEMA: none  MUSCLE TONE: RUE: Mild  COGNITION: Overall cognitive status: Impaired  VISION: Subjective report: Pt. Has not noticed the changes in vision but her sister has. Caregiver reports that Pt. Does not bring much attention to the R side.  Baseline vision:  Visual history:   VISION ASSESSMENT: TBD   PERCEPTION: TBD  PRAXIS: Impaired: Motor planning                                                                                                                           TREATMENT DATE: 03/12/2024     Therapeutic Activities:  -Pt. Tolerated BUE Progressive gross grip strengthening with  11.2# of grip strength resistive force to complete for 1st trial and tolerated 6.6# during 2nd trial of grip strengthening task. -Pt. Discarded jumbo pegs into container using gross gripper, in multiple planes to promote functional reaching and sustained grip.      Therapeutic Ex.:  -Pt. Performed AAROM and PROM to end range to encourage gentle  shoulder flexion using a pulley.   Neuromuscular re-education:  -Facilitated BUE FMC tasks using the Grooved pegboard, grasping 1 grooved pegs from a horizontal position in the shallow dish, and transitioning them to a vertical position in preparation for placing them upright into the pegboard.  -Pt. Performed thumb opposition while removing 1 grooved pegs from an upright vertical position, to place 1 grooved pegs into shallow dish.    Self-Care:  -Pt. worked on Surveyor, minerals and speed. Pt. was able to write 4 sentences in 3 minutes. Pt. used a tripod hand grasp on the pen. Pt. was able to hold the pen for the entire exercise.    PATIENT EDUCATION: Education details: Grip/Pinch strength, POC, ROM and BUE strength, BUE motor control, handwriting, pre-writing skills Person educated: Patient and Sister Education method: Explanation, Demonstration, Tactile cues, and Verbal cues Education comprehension: verbalized understanding and returned demonstration  HOME EXERCISE PROGRAM: Yellow theraputty HEP   GOALS: Goals reviewed with patient? Yes  SHORT TERM GOALS: Target date: 03/10/2024    Pt. Will be independent utilizing HEPs for hand strengthening exercises.  Baseline: Eval: No current HEP program Goal status: INITIAL   LONG TERM GOALS: Target date: 04/21/2024   Pt. Will improve BUE grip strength by 5# of force to be able to securely hold items. Baseline:  03/03/2024: R grip strength: 23#, L grip strength: 28#. Pt. has difficulty securely holding items in her hand. Eval: R grip strength: 21#, L grip strength: 14# Goal status: progressing, ongoing  2.  Pt. Will improve RUE [facility] skills by 3 sec. to be able to manipulate zippers/buttons. Baseline: 03/03/2024: 9 hole peg test: R side: 1 min 3 sec, L side: 36 sec. 9 hole peg test; R side: 72 secs, L side: 40 secs Goal status: progressing, ongoing  3.  Pt. Will increase R shoulder flex/shoulder ABD ROM by 10 degrees to perform  ADLs/IADLs.  Baseline: 03/03/2024: R shoulder flex: 61(74), R shoulder  ABD: 45(58) R shoulder flex: 50(58), R shoulder ABD: 43(48) Goal status: progressing, ongoing  4.  Pt. Will improve handwriting to 50% legible in cursive form, legibility to be able to send written correspondence Baseline: 03/03/2024: 50% legible in cursive form, Eval: 10% legible in cursive form, 90% legible in printed form. Goal status: Continue  5.  Pt. Will improve BUE pinch strength by 2# of force to  be able to assist with hiking pants Baseline: 03/03/2024: Lateral Pinch: R: 6#, L: 5#, 3 pt pinch: R: 6#, L: 3#.  Eval: Lateral Pinch: 7, R 3 pt. Pinch: 4, L Lateral Pinch: 4, L 3 pt. Pinch: 5 Goal status: partially met  6.  Pt. Will increase R wrist extension/flexion by 5 degrees in anticipation of reaching hold of items for ADLs. Baseline: 03/03/2024: R wrist ext: 30 (34), R Wrist flex: 42(48) Eval: R Wrist ext: 20(21), R Wrist flex: 30(31) Goal status: Continue  ASSESSMENT:  CLINICAL IMPRESSION:  Pt. Tolerated  the treatment session well today. Pt. Has improved with performing translatory movements with the R hand using 1 Grooved pegs. However, Pt. Attempted to grasp 3 1 pegs at a time, to perform translatory movements and presented with difficulty to sustain grasp onto 2 grooved pegs, while placing 1 peg into the pegboard using the R hand. Pt. Was assessed on handwriting, to address handwriting goal. Pt.. Verbally confirmed that her handwriting has not change since before the onset of her most recent stroke. Pt. Attempted to use Pen Again Pen and Pen Grip to assess hand written correspondence. Pt. Continues to present with difficulty manipulating Jumbo Pegs from container, in preparation of placing jumbo pegs into pegboard. Pt. Was able to tolerate 11.2# of resisitive force during the 1st trialof BUE gross grip strengthening task, however, Pt. Reports 6/10 pain at the dorsal aspect of the R forearm during the 2nd trial,  requiring to decrease resisitance to 6.6# during the 2nd trial. Pt.Tolerated use of pulley system to promote alternating BUE AAROM shoulder flexion to PROM at end range. Pt. Required min vc , tactile cues and visual demonstration to operate pulley system, as well as tactile and vc for compensation in the RUE. Pt. continues to benefit from OT services to work on RUE ROM, RUE strengthening, bilateral grip strength, bilateral pinch strength, bilateral [facility] skills, and Right sided awareness and attention in order to improve functional independence of ADL/IADL tasks at home.   PERFORMANCE DEFICITS: in functional skills including ADLs, IADLs, coordination, dexterity, proprioception, sensation, tone, ROM, strength, pain, Fine motor control, Gross motor control, endurance, and UE functional use, and psychosocial skills including environmental adaptation, habits, interpersonal interactions, and routines and behaviors.   IMPAIRMENTS: are limiting patient from ADLs, IADLs, rest and sleep, work, leisure, and social participation.   CO-MORBIDITIES: may have co-morbidities  that affects occupational performance. Patient will benefit from skilled OT to address above impairments and improve overall function.  MODIFICATION OR ASSISTANCE TO COMPLETE EVALUATION: Min-Moderate modification of tasks or assist with assess necessary to complete an evaluation.  OT OCCUPATIONAL PROFILE AND HISTORY: Detailed assessment: Review of records and additional review of physical, cognitive, psychosocial history related to current functional performance.  CLINICAL DECISION MAKING: Moderate - several treatment options, min-mod task modification necessary  REHAB POTENTIAL: Good  EVALUATION COMPLEXITY: Moderate    PLAN:  OT FREQUENCY: 2x/week  OT DURATION: 12 weeks  PLANNED INTERVENTIONS: 97535 self care/ADL training, 02889 therapeutic exercise, 97530 therapeutic activity, 97112 neuromuscular re-education, 97018 paraffin, 02989  moist heat, 97010  cryotherapy, 97034 contrast bath, [id number] electrical stimulation (manual), passive range of motion, visual/perceptual remediation/compensation, energy conservation, patient/family education, and DME and/or AE instructions  RECOMMENDED OTHER SERVICES: ST & PT  CONSULTED AND AGREED WITH PLAN OF CARE: Patient and family member/caregiver  PLAN FOR NEXT SESSION: treatment  Damien Nap, OTS  This entire session was performed under direct supervision and direction of a licensed therapist/therapist assistant . I have personally read, edited and approve of the note as written.   Richardson Otter, MS, OTR/L   03/13/2024, 3:03 PM

## 2024-03-17 ENCOUNTER — Ambulatory Visit: Attending: Physician Assistant | Admitting: Speech Pathology

## 2024-03-17 ENCOUNTER — Ambulatory Visit: Admitting: Physical Therapy

## 2024-03-17 ENCOUNTER — Ambulatory Visit: Admitting: Occupational Therapy

## 2024-03-17 DIAGNOSIS — R278 Other lack of coordination: Secondary | ICD-10-CM

## 2024-03-17 DIAGNOSIS — R41841 Cognitive communication deficit: Secondary | ICD-10-CM | POA: Insufficient documentation

## 2024-03-17 DIAGNOSIS — R269 Unspecified abnormalities of gait and mobility: Secondary | ICD-10-CM | POA: Diagnosis present

## 2024-03-17 DIAGNOSIS — R4701 Aphasia: Secondary | ICD-10-CM | POA: Diagnosis present

## 2024-03-17 DIAGNOSIS — M6281 Muscle weakness (generalized): Secondary | ICD-10-CM

## 2024-03-17 DIAGNOSIS — I69351 Hemiplegia and hemiparesis following cerebral infarction affecting right dominant side: Secondary | ICD-10-CM | POA: Diagnosis present

## 2024-03-17 DIAGNOSIS — R2681 Unsteadiness on feet: Secondary | ICD-10-CM

## 2024-03-17 NOTE — Therapy (Signed)
 Occupational Therapy Treatment Note   Patient Name: Haley Jones MRN: [id number] DOB:1956/02/24, 68 y.o., female Today's Date: 03/17/2024  PCP: Cleotilde Oneil FALCON, MD REFERRING PROVIDER: Pegge Toribio PARAS, PA-C   OT End of Session - 03/17/24 1447     Visit Number 14    Number of Visits 24    Date for OT Re-Evaluation 04/22/24    OT Start Time 1445    OT Stop Time 1530    OT Time Calculation (min) 45 min    Activity Tolerance Patient tolerated treatment well    Behavior During Therapy [facility] for tasks assessed/performed                Past Medical History:  Diagnosis Date   Adnexal mass    Anxiety    Chronic back pain    Depression    Diabetes mellitus without complication (HCC)    Endometriosis 03/12/2018   GERD (gastroesophageal reflux disease)    Heart murmur 02/2018   undetected until she was an adult. 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   Weight loss 12/06/2017   Past Surgical History:  Procedure Laterality Date   ABDOMINAL HYSTERECTOMY  2008   ovaries also   CESAREAN SECTION  1994   COLONOSCOPY     COLONOSCOPY WITH PROPOFOL  N/A 02/12/2018   Procedure: COLONOSCOPY WITH PROPOFOL ;  Surgeon: Toledo, Ladell POUR, MD;  Location: [facility];  Service: Gastroenterology;  Laterality: N/A;   ESOPHAGOGASTRODUODENOSCOPY (EGD) WITH PROPOFOL  N/A 02/12/2018   Procedure: ESOPHAGOGASTRODUODENOSCOPY (EGD) WITH PROPOFOL ;  Surgeon: Toledo, Ladell POUR, MD;  Location: [facility];  Service: Gastroenterology;  Laterality: N/A;   EYE SURGERY Left 1973   muscle shortening to straighten cross eyes   LAPAROSCOPY N/A 02/20/2018   Procedure: LAPAROSCOPY DIAGNOSTIC, ( RESECTION OF RIGHT LOWER QUADRANT ABDOMINAL MASS);  Surgeon: Nicholaus Selinda Birmingham, MD;  Location: [facility] ORS;  Service: General;  Laterality: N/A;   ROTATOR CUFF REPAIR Left 2011   Patient Active Problem List   Diagnosis Date  Noted   Depression with anxiety 01/04/2024   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   Acute CVA (cerebrovascular accident) (HCC) 12/20/2023   Primary hypertension 12/20/2023   Acute kidney injury superimposed on chronic kidney disease (HCC) 12/20/2023   DM type 2 with diabetic mixed hyperlipidemia (HCC) 11/17/2022   Major depressive disorder, recurrent, 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  Rationale for Evaluation and Treatment: Rehabilitation  SUBJECTIVE:   SUBJECTIVE STATEMENT: Pt. Reports that she was successful in tying her shoes when performing LB dressing at home.  Pt accompanied by: self and family member  PERTINENT HISTORY: Pt. Is a 68 yo female with hx of a L MCA CVA. Pt. Was admitted into ED on 12/20/2023 with an onset of RUE weakness, numbness dysmetria, and visual deficits. Upon assessment, it was concluded that Pt. Experienced multiple infarcts in the L Parietal/Occipital region. PMHx: Dizziness, Falls  PRECAUTIONS: None  WEIGHT BEARING RESTRICTIONS: No  PAIN:  Are you having pain?  03/12/2024:  6/10 pain at the R dorsal forearm during gross grip strengthening task. 03/10/2024: No pain today. 03/05/2024: No pain, 2-3/10 level of discomfort during use of green resisitve clips.    FALLS: Has patient fallen in last 6 months? Yes. Number of falls 10  LIVING ENVIRONMENT: Lives with: lives with their family and lives alone Lives in: House/apartment Stairs: No, Ramp Has following equipment at home: Walker - 2 wheeled, Shower bench, and bed side commode  PLOF:  Independent  PATIENT GOALS: To be able to feed herself and hold a fork and knife better.   OBJECTIVE:  Note: Objective measures were completed at Evaluation unless otherwise noted.  HAND DOMINANCE: Left  ADLs: Overall ADLs:  Transfers/ambulation related to ADLs:  Eating: Independent  Grooming: independent UB Dressing: Pt. Has difficulty managing buttons LB Dressing: jean zippers are difficult to manipulate, hiking pants, and clothing negotiation are difficult. Toileting: Independent Bathing: Independent Tub Shower transfers: stepping into shower is difficult.  Equipment: Grab bars, shower bench, bedside commode  IADLs: Shopping: Does not currently do that/has not tried Light housekeeping: Does not currently do that/has not tried Meal Prep: Has not cooked in months, did not prior to CVA, sister provides meals. Community mobility: Relies on family, and friends Medication management: Assistance from sister to initiate medication management, she gives herself a shot for diabetes Financial management: Sister is assisting with monthly bill management Handwriting: Cursive form is 10% legible; Printed form is 90% legible  MOBILITY STATUS: Needs Assist: CGA and Hx of falls  POSTURE COMMENTS:  No Significant postural limitations Sitting balance: Good  ACTIVITY TOLERANCE: Activity tolerance: Good  FUNCTIONAL OUTCOME MEASURES: MAM-20: TBD  UPPER EXTREMITY ROM:    Active ROM Right eval Right 03/03/2024 Left Eval [facility]  Shoulder flexion 50(58) 61(74)   Shoulder abduction 43(48) 45(58)   Shoulder adduction     Shoulder extension     Shoulder internal rotation     Shoulder external rotation     Elbow flexion 118(131) 125(128)   Elbow extension -24(-22) -5(0)   Wrist flexion 20 (21) 42(48)   Wrist extension 30(31) 30(34)   Wrist ulnar deviation     Wrist radial deviation     Wrist pronation     Wrist supination     (Blank rows = not tested)  UPPER EXTREMITY MMT:     MMT  Right eval Right 03/03/2024 Left eval  Shoulder flexion 2/5 2/5 WFL  Shoulder abduction 2/5 2/5 [facility]  Shoulder adduction     Shoulder extension     Shoulder internal rotation     Shoulder external rotation     Middle trapezius     Lower trapezius     Elbow flexion 4-/5 4/5 WFL  Elbow extension 4-/5 4/5 WFL  Wrist flexion 2+/5 4-/5 WFL  Wrist extension 3-/5 4-/5 WFL  Wrist ulnar deviation     Wrist radial deviation     Wrist pronation     Wrist supination     (Blank rows = not tested)  HAND FUNCTION: Grip strength: Right: 21 lbs; Left: 14 lbs, Lateral pinch: Right: 7 lbs, Left: 4 lbs, and 3 point pinch: Right: 4 lbs, Left: 5 lbs  03/03/2024:  Grip strength: Right: 23#, Left: 28#, Lateral pinch: Right: 6#, Left: 5#, 3 point pinch: Right: 6#, Left: 3#  COORDINATION: 9 Hole Peg test: Right: 72 sec; Left: 40 sec  03/03/2024: 9 hole peg test: Right: 1 min 3 sec, Left: 36 sec  SENSATION: Light touch: Impaired  Proprioception:  Impaired   EDEMA: none  MUSCLE TONE: RUE: Mild  COGNITION: Overall cognitive status: Impaired  VISION: Subjective report: Pt. Has not noticed the changes in vision but her sister has. Caregiver reports that Pt. Does not bring much attention to the R side.  Baseline vision:  Visual history:   VISION ASSESSMENT: TBD   PERCEPTION: TBD  PRAXIS: Impaired: Motor planning                                                                                                                           TREATMENT DATE: 03/17/2024     Therapeutic Activities:  -Pt. Tolerated BUE Progressive gross grip strengthening with  11.2# of grip strength resistive force to complete for 1st trial and tolerated 6.6# during 2nd trial of grip strengthening task. -Pt. Discarded jumbo pegs into container using gross gripper, in multiple planes to promote functional reaching and sustained grip.      Therapeutic Ex.:  -Pt. Performed AAROM and PROM to end range to encourage gentle  shoulder flexion using a pulley.   Neuromuscular re-education:  -Facilitated BUE FMC tasks using the Grooved pegboard, grasping 1 grooved pegs from a horizontal position in the shallow dish, and transitioning them to a vertical position in preparation for placing them upright into the pegboard.  -Pt. Performed thumb opposition while removing 1 grooved pegs from an upright vertical position, to place 1 grooved pegs into shallow dish.    Self-Care:  -Pt. worked on Surveyor, minerals and speed. Pt. was able to write 4 sentences in 3 minutes. Pt. used a tripod hand grasp on the pen. Pt. was able to hold the pen for the entire exercise.    PATIENT EDUCATION: Education details: Grip/Pinch strength, POC, ROM and BUE strength, BUE motor control, handwriting, pre-writing skills Person educated: Patient and Sister Education method: Explanation, Demonstration, Tactile cues, and Verbal cues Education comprehension: verbalized understanding and returned demonstration  HOME EXERCISE PROGRAM: Yellow theraputty HEP   GOALS: Goals reviewed with patient? Yes  SHORT TERM GOALS: Target date: 03/10/2024    Pt. Will be independent utilizing HEPs for hand strengthening exercises.  Baseline: Eval: No current HEP program Goal status: INITIAL   LONG TERM GOALS: Target date: 04/21/2024   Pt. Will improve BUE grip strength by 5# of force to be able to securely hold items. Baseline:  03/03/2024: R grip strength: 23#, L grip strength: 28#. Pt. has difficulty securely holding items in her hand. Eval: R grip strength: 21#, L grip strength: 14# Goal status: progressing, ongoing  2.  Pt. Will improve RUE [facility] skills by 3 sec. to be able to manipulate zippers/buttons. Baseline: 03/03/2024: 9 hole peg test: R side: 1 min 3 sec, L side: 36 sec. 9 hole peg test; R side: 72 secs, L side: 40 secs Goal status: progressing, ongoing  3.  Pt. Will increase R shoulder flex/shoulder ABD ROM by 10 degrees to perform  ADLs/IADLs.  Baseline: 03/03/2024: R shoulder flex: 61(74), R shoulder  ABD: 45(58) R shoulder flex: 50(58), R shoulder ABD: 43(48) Goal status: progressing, ongoing  4.  Pt. Will improve handwriting to 50% legible in cursive form, legibility to be able to send written correspondence Baseline: 03/03/2024: 50% legible in cursive form, Eval: 10% legible in cursive form, 90% legible in printed form. Goal status: Discontinue goal per Pt. report that it has returned to baseline.  5.  Pt. Will improve BUE pinch strength by 2# of force to  be able to assist with hiking pants Baseline: 03/03/2024: Lateral Pinch: R: 6#, L: 5#, 3 pt pinch: R: 6#, L: 3#.  Eval: Lateral Pinch: 7, R 3 pt. Pinch: 4, L Lateral Pinch: 4, L 3 pt. Pinch: 5 Goal status: partially met  6.  Pt. Will increase R wrist extension/flexion by 5 degrees in anticipation of reaching hold of items for ADLs. Baseline: 03/03/2024: R wrist ext: 30 (34), R Wrist flex: 42(48) Eval: R Wrist ext: 20(21), R Wrist flex: 30(31) Goal status: Continue  ASSESSMENT:  CLINICAL IMPRESSION:  Pt. Tolerated  the treatment session well today. Pt. Has improved with performing translatory movements with the R hand using 1 Grooved pegs. However, Pt. Attempted to grasp 3 1 pegs at a time, to perform translatory movements and presented with difficulty to sustain grasp onto 2 grooved pegs, while placing 1 peg into the pegboard using the R hand. Pt. Was assessed on handwriting, to address handwriting goal. Pt.. Verbally confirmed that her handwriting has not change since before the onset of her most recent stroke. Pt. Attempted to use Pen Again Pen and Pen Grip to assess hand written correspondence. Pt. Continues to present with difficulty manipulating Jumbo Pegs from container, in preparation of placing jumbo pegs into pegboard. Pt. Was able to tolerate 11.2# of resisitive force during the 1st trialof BUE gross grip strengthening task, however, Pt. Reports 6/10 pain at  the dorsal aspect of the R forearm during the 2nd trial, requiring to decrease resisitance to 6.6# during the 2nd trial. Pt.Tolerated use of pulley system to promote alternating BUE AAROM shoulder flexion to PROM at end range. Pt. Required min vc , tactile cues and visual demonstration to operate pulley system, as well as tactile and vc for compensation in the RUE. Pt. continues to benefit from OT services to work on RUE ROM, RUE strengthening, bilateral grip strength, bilateral pinch strength, bilateral [facility] skills, and Right sided awareness and attention in order to improve functional independence of ADL/IADL tasks at home.   PERFORMANCE DEFICITS: in functional skills including ADLs, IADLs, coordination, dexterity, proprioception, sensation, tone, ROM, strength, pain, Fine motor control, Gross motor control, endurance, and UE functional use, and psychosocial skills including environmental adaptation, habits, interpersonal interactions, and routines and behaviors.   IMPAIRMENTS: are limiting patient from ADLs, IADLs, rest and sleep, work, leisure, and social participation.   CO-MORBIDITIES: may have co-morbidities  that affects occupational performance. Patient will benefit from skilled OT to address above impairments and improve overall function.  MODIFICATION OR ASSISTANCE TO COMPLETE EVALUATION: Min-Moderate modification of tasks or assist with assess necessary to complete an evaluation.  OT OCCUPATIONAL PROFILE AND HISTORY: Detailed assessment: Review of records and additional review of physical, cognitive, psychosocial history related to current functional performance.  CLINICAL DECISION MAKING: Moderate - several treatment options, min-mod task modification necessary  REHAB POTENTIAL: Good  EVALUATION COMPLEXITY: Moderate    PLAN:  OT FREQUENCY: 2x/week  OT DURATION: 12 weeks  PLANNED INTERVENTIONS: 97535 self care/ADL training, 02889 therapeutic exercise, 97530 therapeutic activity,  02887 neuromuscular re-education, 97018 paraffin, 02989 moist heat, 97010 cryotherapy, 97034 contrast bath, 97032 electrical stimulation (manual), passive range of motion, visual/perceptual remediation/compensation, energy conservation, patient/family education, and DME and/or AE instructions  RECOMMENDED OTHER SERVICES: ST & PT  CONSULTED AND AGREED WITH PLAN OF CARE: Patient and family member/caregiver  PLAN FOR NEXT SESSION: treatment   Ilhan Debenedetto, MS, OTR/L   03/17/2024, 2:48 PM

## 2024-03-17 NOTE — Therapy (Signed)
 OUTPATIENT SPEECH LANGUAGE PATHOLOGY  COGNITIVE-COMMUNICATION TREATMENT   Patient Name: Haley Jones MRN: [id number] DOB:[birth year], 68 y.o., female Today's Date: 03/17/2024  PCP: Cleotilde Oneil FALCON, MD  REFERRING PROVIDER: Pegge Toribio PARAS, PA-C    End of Session - 03/17/24 1317     Visit Number 13    Number of Visits 25    Date for SLP Re-Evaluation 04/27/24    Authorization Type [employer]    Authorization Time Period 01/28/2024 thru 04/27/2024    Authorization - Visit Number 13    Authorization - Number of Visits 24    Progress Note Due on Visit 20    SLP Start Time 1315    SLP Stop Time  1400    SLP Time Calculation (min) 45 min    Activity Tolerance Patient tolerated treatment well          Past Medical History:  Diagnosis Date   Adnexal mass    Anxiety    Chronic back pain    Depression    Diabetes mellitus without complication (HCC)    Endometriosis 03/12/2018   GERD (gastroesophageal reflux disease)    Heart murmur 02/2018   undetected until she was an adult. 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   Weight loss 12/06/2017   Past Surgical History:  Procedure Laterality Date   ABDOMINAL HYSTERECTOMY  2008   ovaries also   CESAREAN SECTION  1994   COLONOSCOPY     COLONOSCOPY WITH PROPOFOL  N/A 02/12/2018   Procedure: COLONOSCOPY WITH PROPOFOL ;  Surgeon: Toledo, Ladell POUR, MD;  Location: [facility];  Service: Gastroenterology;  Laterality: N/A;   ESOPHAGOGASTRODUODENOSCOPY (EGD) WITH PROPOFOL  N/A 02/12/2018   Procedure: ESOPHAGOGASTRODUODENOSCOPY (EGD) WITH PROPOFOL ;  Surgeon: Toledo, Ladell POUR, MD;  Location: [facility];  Service: Gastroenterology;  Laterality: N/A;   EYE SURGERY Left 1973   muscle shortening to straighten cross eyes   LAPAROSCOPY N/A 02/20/2018   Procedure: LAPAROSCOPY DIAGNOSTIC, ( RESECTION OF RIGHT LOWER QUADRANT ABDOMINAL MASS);   Surgeon: Nicholaus Selinda Birmingham, MD;  Location: [facility] ORS;  Service: General;  Laterality: N/A;   ROTATOR CUFF REPAIR Left 2011   Patient Active Problem List   Diagnosis Date Noted   Urinary incontinence as sequela of cerebrovascular accident (CVA) 02/11/2024   Depression with anxiety 01/04/2024   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   Acute CVA (cerebrovascular accident) (HCC) 12/20/2023   Primary hypertension 12/20/2023   Acute kidney injury superimposed on chronic kidney disease (HCC) 12/20/2023   DM type 2 with diabetic mixed hyperlipidemia (HCC) 11/17/2022   Major depressive disorder, recurrent, 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/17/2022   B12 deficiency 02/23/2021   SBO (small bowel obstruction) (HCC) 01/29/2019   Barrett's esophagus without dysplasia 04/15/2018   Weight loss 12/06/2017   Hoarse 12/06/2017   Lumbar disc disease 07/26/2017   Diabetes mellitus type 2, controlled, without complications (HCC) 02/09/2015   Surgical menopause 11/26/2014    ONSET DATE: 12/20/23   REFERRING DIAG: CVA  THERAPY DIAG:  Cognitive communication deficit  Rationale for Evaluation and Treatment Rehabilitation  SUBJECTIVE:   PERTINENT HISTORY: Kelani K. Toops is a 68 year old left-handed female who presented to [facility] on 12/20/23 with right side weakness/falls and dizziness. MRI of the brain showed patchy acute cortical/subcortical infarcts within the left parietal occipital lobes individually measuring up to 3 cm (MCA vascular territory). Subtle petechial hemorrhage associated with some of these infarcts.  No frank hemorrhagic conversion.  Additional punctate left MCA territory cortical acute infarct within the left insula.  Background parenchymal atrophy and chronic small vessel ischemic disease with chronic lacunar infarcts.  CT angio showed severe stenosis/nonocclusive thrombus of the left MCA bifurcation affecting the inferior division.  Nonocclusive embolus also visible within the left parietal branch as  well. Echocardiogram ejection fraction of 60 to 65% no wall motion abnormalities grade 1 diastolic dysfunction. Past medical history also noted for hypertension, B12 deficiency, type 2 diabetes mellitus, hyperlipidemia, CKD stage III, depression.  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 D/c home after CIR admission 12/28/23-01/18/24. Sister lives nearby and checks on patient throughout the day, assists with meals, bathing, and medications.  DIAGNOSTIC FINDINGS: see above  PAIN:  Are you having pain? No   FALLS: Has patient fallen in last 6 months?  Yes, Number of falls: 1 since returning home from rehab, multiple prior to hospitalization  LIVING ENVIRONMENT: Lives with: lives alone Lives in: House/apartment  PLOF:  Level of assistance: Independent with IADLs Employment: Retired   PATIENT GOALS   Pt states she wants to drive again  SUBJECTIVE STATEMENT: Pt alert, cooperative. Talkative,  Pt accompanied by: self  OBJECTIVE:   TODAY'S TREATMENT: Skilled treatment session focused on pt's cognitive communication goals. SLP facilitated session by providing the following interventions:  I am still doing a good job making my meals. She report making breakfast and lunch with her sister making supper each day. Pt also reports that she problem solved her pill organizer yesterday with inconsistent report of pt errors (sister not present to collaborate pt report). Continue to recommend supervision.   Given semi-complex scanning task, pt able to cross out the designated letter every time it appears in the line with 1 out of 16 lines entirely correct and 15 out 16 lines having ~ 50% missed letters.   Instructed pt to enter list of therapy dates for August with new calendar given to pt  PATIENT EDUCATION: Education details: as above Person educated: Patient Education method: Verbal cues Education comprehension:  verbalized understanding and needs further education   HOME EXERCISE PROGRAM:   See above and put list of therapy sessions on August Calendar - new calendar printed  GOALS:  Goals reviewed with patient? Yes  SHORT TERM GOALS: Target date: 10 sessions            Updated 03/05/2024 The patient will sustain attention in simple cognitive linguistic task for 5 minutes given min verbal cues.  Baseline: Goal status: INITIAL: MET: upgraded to With Min A, pt will demonstrate alternating attention between 2 basic tasks in 5 out of 7 opportunities.    2.  Patient will reduce impulsivity by preplanning steps before completing a task with moderate verbal cues. Baseline:  Goal status: INITIAL: MET   3.  Patient will complete Patient Reported Outcome Measure to assess self-perception of deficits.  Baseline:  Goal status: INITIAL: MET   4.  With Min A, patient will complete complex visual-spatial activities with 75% accuracy.               Baseline:               Goal status: INITIAL   5. With Mod I, pt will recall and follow therapy schedule with 80% accuracy.               Baseline:  Goal status: INITIAL         LONG TERM GOALS: Target date: 04/27/2024             Updated 03/05/2024 Patient will alternate attention between two moderately complex tasks 80% acc, with min verbal cues.  Baseline:  Goal status: INITIAL: great progress made   2.  Patient will demonstrate emergent awareness by identifying 75% of errors with min cues for double checking. Baseline:  Goal status: INITIAL: great progress made   3.  Patient will recall safety precautions/mobility recommendations using visual aids if necessary to reduce risk for falls. Baseline:  Goal status: INITIAL: great progress made   ASSESSMENT:  CLINICAL IMPRESSION: Based on recent assessment, Phoebie Shad presents with overall moderate cognitive communication impairment per standardized testing using the Cognitive  Linguistic Quick Test. Primary deficit areas (severe) include attention, executive function, and visuospatial skills. Patient's impulsivity contributed significantly to testing errors, as did poor sustained attention to testing instructions. Impulsivity impacting safety at home, contributing to fall at home. Patient and sister report reduced frustration tolerance. She is often frustrated when being given instructions. Patient demonstrated some emergent awareness of errors on tasks such as mazes, but was unaware of perseverations in design generation and generative naming, and errors in clock drawing, symbol cancellation, and symbol trails tasks.   Pt eager throughout session to target higher level cognitive skills in the hopes of returning to driving. See the above treatment note for details on progress.   OBJECTIVE IMPAIRMENTS include attention, memory, awareness, and executive functioning. These impairments are limiting patient from managing medications, managing appointments, managing finances, household responsibilities, ADLs/IADLs, and effectively communicating at home and in community. Factors affecting potential to achieve goals and functional outcome are ability to learn/carryover information and severity of impairments. Patient will benefit from skilled SLP services to address above impairments and improve overall function.  REHAB POTENTIAL: Good  PLAN: SLP FREQUENCY: 1-2x/week  SLP DURATION: 12 weeks  PLANNED INTERVENTIONS: Environmental controls, Cueing hierachy, Cognitive reorganization, Internal/external aids, Functional tasks, SLP instruction and feedback, Compensatory strategies, Patient/family education, and 07492 Treatment of speech (30 or 45 min)    Anwita Mencer B. Rubbie, M.S., CCC-SLP, Tree surgeon Certified Brain Injury Specialist [facility]  [facility] Office [phone] Ascom [phone] Fax  [phone]

## 2024-03-17 NOTE — Therapy (Signed)
 OUTPATIENT PHYSICAL THERAPY TREATMENT   Patient Name: Haley Jones MRN: [id number] DOB:Nov 04, 1955, 68 y.o., female Today's Date: 03/17/2024  PCP: Dr. Oneil Pinal REFERRING PROVIDER: Toribio Pitch, PA-C  END OF SESSION:   PT End of Session - 03/17/24 1403     Visit Number 15    Number of Visits 24    Date for PT Re-Evaluation 04/21/24    Progress Note Due on Visit 20    PT Start Time 1402    PT Stop Time 1442    PT Time Calculation (min) 40 min    Equipment Utilized During Treatment Gait belt    Activity Tolerance Patient tolerated treatment well    Behavior During Therapy WFL for tasks assessed/performed               Past Medical History:  Diagnosis Date   Adnexal mass    Anxiety    Chronic back pain    Depression    Diabetes mellitus without complication (HCC)    Endometriosis 03/12/2018   GERD (gastroesophageal reflux disease)    Heart murmur 02/2018   undetected until she was an adult. 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   Weight loss 12/06/2017   Past Surgical History:  Procedure Laterality Date   ABDOMINAL HYSTERECTOMY  2008   ovaries also   CESAREAN SECTION  1994   COLONOSCOPY     COLONOSCOPY WITH PROPOFOL  N/A 02/12/2018   Procedure: COLONOSCOPY WITH PROPOFOL ;  Surgeon: Toledo, Ladell POUR, MD;  Location: [facility];  Service: Gastroenterology;  Laterality: N/A;   ESOPHAGOGASTRODUODENOSCOPY (EGD) WITH PROPOFOL  N/A 02/12/2018   Procedure: ESOPHAGOGASTRODUODENOSCOPY (EGD) WITH PROPOFOL ;  Surgeon: Toledo, Ladell POUR, MD;  Location: [facility];  Service: Gastroenterology;  Laterality: N/A;   EYE SURGERY Left 1973   muscle shortening to straighten cross eyes   LAPAROSCOPY N/A 02/20/2018   Procedure: LAPAROSCOPY DIAGNOSTIC, ( RESECTION OF RIGHT LOWER QUADRANT ABDOMINAL MASS);  Surgeon: Nicholaus Selinda Birmingham, MD;  Location: [facility];  Service: General;   Laterality: N/A;   ROTATOR CUFF REPAIR Left 2011   Patient Active Problem List   Diagnosis Date Noted   Urinary incontinence as sequela of cerebrovascular accident (CVA) 02/11/2024   Depression with anxiety 01/04/2024   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   Acute CVA (cerebrovascular accident) (HCC) 12/20/2023   Primary hypertension 12/20/2023   Acute kidney injury superimposed on chronic kidney disease (HCC) 12/20/2023   DM type 2 with diabetic mixed hyperlipidemia (HCC) 11/17/2022   Major depressive disorder, recurrent, 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/17/2022   B12 deficiency 02/23/2021   SBO (small bowel obstruction) (HCC) 01/29/2019   Barrett's esophagus without dysplasia 04/15/2018   Weight loss 12/06/2017   Hoarse 12/06/2017   Lumbar disc disease 07/26/2017   Diabetes mellitus type 2, controlled, without complications (HCC) 02/09/2015   Surgical menopause 11/26/2014    ONSET DATE: 12/20/2023  REFERRING DIAG: P36.487 (ICD-10-CM) - Left middle cerebral artery stroke (HCC)   THERAPY DIAG:  Muscle weakness (generalized)  Abnormality of gait  Unsteadiness on feet  Rationale for Evaluation and Treatment: Rehabilitation  SUBJECTIVE:  SUBJECTIVE STATEMENT:   Denies any falls since last session. Still a little weary of walking since near fall but was glad PT was able to prevent fall last session.   PERTINENT HISTORY:Pt states she is not sure the exact date of when her CVA happened because she kept falling at home trying to care for herself and her daughter, but she finally went to the hospital when her sister insisted on it. Pt states her special needs daughter, [name], passed away recently while pt was in the hospital at [facility] before she was transferred to [facility] for stroke  rehabilitation.Pt's sister states pt requires supervision/cuing for ADLs  to don clothes correctly, with correct orientation (potential apraxia?) Pt/sister report pt has R side inattention.Pt reports supposedly one of her legs is shorter than the other, believes her R LE might be the shorter one. 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     PAIN:  Are you having pain? No  PRECAUTIONS: Fall RED FLAGS: None  WEIGHT BEARING RESTRICTIONS: No FALLS: Has patient fallen in last 6 months? Yes. Number of falls 6+ before going to hospital and 1 fall since being home from CIR - pt states she was trying to gather her clothes by herself rather than waiting for her sister to get there to help her  LIVING ENVIRONMENT Lives with: lives alone and but pt's sister, [name], comes over daily to assist with ADLs and stays to provide supervision all day - pt states at night she would be able to walk to bathroom using RW if needed (but she wears depends in event of incontinence) Lives in: House/apartment Stairs: she has steps, but she doesn't have to use them (house was wheelchair accessible for her daughter)  Has following equipment at home: Environmental consultant - 2 wheeled, shower chair, Grab bars, Ramped entry, and transport chair  PLOF: Independent, Independent with household mobility without device, Independent with homemaking with ambulation, Independent with gait, Independent with transfers, and she was the primary caregiver for her recently deceased daughter   CLOF: Sister provides supervision daily for pt to ambulate using RW safely and provides assist for ADLs (showering and dressing - progressing towards supervision with these activities); Sister states pt requires cues to turn and step back fully prior to sitting to ensure her safety  PATIENT GOALS: get back to being able to go shopping with improved community level ambulation without the walker; return to driving    OBJECTIVE:  Note: Objective measures were completed at Evaluation unless otherwise noted.  DIAGNOSTIC FINDINGS:   EXAM: MRI HEAD WITHOUT CONTRAST   TECHNIQUE: Multiplanar, multiecho pulse sequences of the brain and surrounding structures were obtained without intravenous contrast.   COMPARISON:  Brain MRI 07/31/2022.  IMPRESSION: 1. Patchy acute cortical/subcortical infarcts within the left parietal and occipital lobes individually measuring up to 3 cm (MCA vascular territory). Subtle petechial hemorrhage associated with some of these infarcts. No frank hemorrhagic conversion. 2. Additional punctate left MCA territory cortical acute infarct within the left insula. 3. Background parenchymal atrophy and chronic small vessel ischemic disease with chronic lacunar infarcts, as described. 4. Paranasal sinus disease as outlined (including severe right maxillary sinusitis).   Electronically Signed   By: Rockey Childs D.O.   On: 12/20/2023 11:52  COGNITION: Overall cognitive status: Within functional limits for tasks assessed   SENSATION: Light touch: initially misses the first 1-2 touches on R LE, but then able to feel remaining  touches Proprioception: Impaired grossly  COORDINATION: Symmetrical heel-to-shin bilaterally  EDEMA:  Not formally assessed, but none observed  MUSCLE TONE: Not formally assessed  POSTURE: rounded shoulders, forward head, and posterior pelvic tilt  LOWER EXTREMITY MMT:    MMT Right Eval Left Eval  Hip flexion 3+, no pain 3+ with some L hip and back pain with this  Hip extension    Hip abduction    Hip adduction    Hip internal rotation    Hip external rotation    Knee flexion 3+ 4  Knee extension 4- 4  Ankle dorsiflexion 3+ 4  Ankle plantarflexion 4- 4  Ankle inversion    Ankle eversion    (Blank rows = not tested)  BED MOBILITY:  Findings: Sit to supine need to assess Supine to sit need to assess *pt reports some difficulty  with bed mobility  TRANSFERS: Sit to stand: SBA  Assistive device utilized: Environmental consultant - 2 wheeled     Stand to sit: SBA  Assistive device utilized: Environmental consultant - 2 wheeled     Chair to chair: SBA  Assistive device utilized: Environmental consultant - 2 wheeled       GAIT: Findings: Gait Characteristics: slight R ankle instability noted, step through pattern, decreased stance time- Right, decreased stride length, and decreased ankle dorsiflexion- Right, Distance walked: ~161ft, Assistive device utilized:Walker - 2 wheeled, Level of assistance: SBA and CGA, and Comments:    FUNCTIONAL TESTS:  5 times sit to stand: 38.75 seconds, using UE support 10 meter walk test: 0.48 m/s using youth RW, requires CGA for safety 6 minute walk test: Visit 2, see note Berg Balance Scale: Visit 2, see note Functional gait assessment: need to assess, when appropriate  PATIENT SURVEYS:  ABC scale 7.5%                                                                                                                             TREATMENT DATE: 03/17/2024  Unless otherwise stated, CGA was provided and gait belt donned in order to ensure pt safety    TA- To improve functional movements patterns for everyday tasks    Gait x 340 ft with 3# AW - multiple LOB with min to mod A to prevent fall  STS x 10 no UE with 3# ball  Gait x 190 ft with 3# AW - less LOB, increased fatigue noted   STS x 10 no UE with 3# AW  Sidestepping practice x 2 times with 3# AW - fatigue noted and pt requests to rest   BP assessed due to abnormal fatigue compared to last session   BP: 143/89  mmHg with HR 81  - another round of sidestepping without weights, still small steps generally but better able to take larger steps without weights    - rest then another set but completes 2 rounds next set  Standing high march 2 x 10 ea LE   Gait with RW with  challenge to increase pace to OT area ( 110 ft)   TE- To improve strength, endurance, mobility, and function of  specific targeted muscle groups or improve joint range of motion or improve muscle flexibility  Seated LAQ 2 x 10 ea LE with 3# AW   PATIENT EDUCATION: Education details: PT POC, findings on assessment today, recommendation to continue using RW for all functional mobility Person educated: Patient and Caregiver [name] Education method: Explanation Education comprehension: verbalized understanding and needs further education  HOME EXERCISE PROGRAM: Access Code: [email or web address] URL: [email or web address] Date: 02/04/2024 Prepared by: Peggye Linear  Exercises - Standing March with Counter Support  - 2 x daily - 7 x weekly - 2 sets - 20 reps - Side stepping with counter support: yellow band loop  - 2 x daily - 7 x weekly - 2 sets - 20 reps - Sit to Stand with Counter Support  - 2 x daily - 7 x weekly - 2 sets - 12 reps   GOALS: Goals reviewed with patient? Yes  SHORT TERM GOALS: Target date: 03/10/2024  Pt will be independent with HEP in order to improve strength and balance in order to decrease fall risk and improve function at home and work.  Baseline: need to initiate Goal status:  ONGOING: 7/16: 2-3x week  LONG TERM GOALS: Target date: 04/21/2024  1.  Patient will complete five times sit to stand test in < 15 seconds indicating an increased LE strength and improved balance. Baseline:  38.75 seconds, using UE support, 7/16: 20.35 sec, UE support on 1st STS only Goal Status: ONGOING   2.  Patient will increase ABC scale score >80% to demonstrate better functional mobility and better confidence with ADLs.   Baseline: 7.5%, 7/16: 18.125% Goal status: INITIAL   3.  Patient will increase Berg Balance score by > 6 points to demonstrate decreased fall risk during functional activities. Baseline: need to assess; 01/30/2024= 34/56, 7/16= 39 Goal status: INITIAL   4. Patient will increase 10 meter walk test to >1.59m/s as to improve gait speed for better community ambulation and to  reduce fall risk. Baseline: 0.48 m/s using youth RW, requires CGA for safety, 7/16: 0.69 m/s  Goal status: INITIAL  5. Patient will increase six minute walk test distance to >1000 for progression to community ambulator and improve gait ability Baseline: need to assess; 01/30/2024=410 feet using RW; 02/13/24: 444ft in 73m30s, 7/16: 657ft using 2WW Goal status: ONGOING   ASSESSMENT:  CLINICAL IMPRESSION:    Patient arrived with good motivation for completion of pt activities.  Session focused on BLE strengthening and endurance. Pt having increased challenge with balance related tasks this date. Pt also showing increased fatigue with similar activities to last session where fatigue was not as limiting. Pt will continue to benefit from skilled physical therapy intervention to address impairments, improve QOL, and attain therapy goals.     OBJECTIVE IMPAIRMENTS: Abnormal gait, decreased activity tolerance, decreased balance, decreased endurance, decreased knowledge of use of DME, decreased mobility, difficulty walking, decreased strength, decreased safety awareness, impaired vision/preception, and pain.   ACTIVITY LIMITATIONS: carrying, lifting, bending, standing, squatting, stairs, transfers, bed mobility, bathing, toileting, dressing, reach over head, hygiene/grooming, and locomotion level  PARTICIPATION LIMITATIONS: meal prep, cleaning, laundry, driving, shopping, and community activity  PERSONAL FACTORS: Age, Time since onset of injury/illness/exacerbation, and 3+ comorbidities: Hypertension, B12 deficiency, type 2 diabetes, hyperlipidemia, CKD stage IIIa are also affecting patient's functional outcome.   REHAB POTENTIAL: Good  CLINICAL DECISION MAKING: Evolving/moderate  complexity  EVALUATION COMPLEXITY: Moderate  PLAN:  PT FREQUENCY: 1-2x/week  PT DURATION: 12 weeks  PLANNED INTERVENTIONS: [id number]- PT Re-evaluation, 97750- Physical Performance Testing, 97110-Therapeutic exercises,  97530- Therapeutic activity, [id number]- Neuromuscular re-education, 97535- Self Care, [id number]- Manual therapy, [id number]- Gait training, [id number]- Orthotic Initial, [phone]- Orthotic/Prosthetic subsequent, [phone]- Canalith repositioning, [phone]- Electrical stimulation (manual), Patient/Family education, Balance training, Stair training, Taping, Joint mobilization, Spinal mobilization, Vestibular training, Visual/preceptual remediation/compensation, DME instructions, Cryotherapy, Moist heat, and Biofeedback  PLAN FOR NEXT SESSION:  - FGA when appropriate and add LTG - floor transfers training - continue HIIT - continue gait training without AD for balance and righting (include multi plane, multidirectional)  - dynamic reaching and dynamic stepping balance    Note: Portions of this document were prepared using Dragon voice recognition software and although reviewed may contain unintentional dictation errors in syntax, grammar, or spelling.  Lonni KATHEE Gainer PT ,DPT Physical Therapist- [facility]    3:40 PM, 03/17/24

## 2024-03-19 ENCOUNTER — Ambulatory Visit: Admitting: Occupational Therapy

## 2024-03-19 ENCOUNTER — Ambulatory Visit: Admitting: Speech Pathology

## 2024-03-19 ENCOUNTER — Encounter: Payer: Self-pay | Admitting: Physical Therapy

## 2024-03-19 ENCOUNTER — Ambulatory Visit: Admitting: Physical Therapy

## 2024-03-19 DIAGNOSIS — R278 Other lack of coordination: Secondary | ICD-10-CM

## 2024-03-19 DIAGNOSIS — R41841 Cognitive communication deficit: Secondary | ICD-10-CM | POA: Diagnosis not present

## 2024-03-19 DIAGNOSIS — R2681 Unsteadiness on feet: Secondary | ICD-10-CM

## 2024-03-19 DIAGNOSIS — M6281 Muscle weakness (generalized): Secondary | ICD-10-CM

## 2024-03-19 DIAGNOSIS — R269 Unspecified abnormalities of gait and mobility: Secondary | ICD-10-CM

## 2024-03-19 DIAGNOSIS — I69351 Hemiplegia and hemiparesis following cerebral infarction affecting right dominant side: Secondary | ICD-10-CM

## 2024-03-19 NOTE — Therapy (Signed)
 Occupational Therapy Treatment Note   Patient Name: Haley Jones MRN: [id number] DOB:06-26-1956, 68 y.o., female Today's Date: 03/19/2024  PCP: Cleotilde Oneil FALCON, MD REFERRING PROVIDER: Pegge Toribio PARAS, PA-C   OT End of Session - 03/19/24 1829     Visit Number 16    Number of Visits 24    Date for OT Re-Evaluation 04/22/24    OT Start Time 1445    OT Stop Time 1530    OT Time Calculation (min) 45 min    Activity Tolerance Patient tolerated treatment well    Behavior During Therapy [facility] for tasks assessed/performed                Past Medical History:  Diagnosis Date   Adnexal mass    Anxiety    Chronic back pain    Depression    Diabetes mellitus without complication (HCC)    Endometriosis 03/12/2018   GERD (gastroesophageal reflux disease)    Heart murmur 02/2018   undetected until she was an adult. 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   Weight loss 12/06/2017   Past Surgical History:  Procedure Laterality Date   ABDOMINAL HYSTERECTOMY  2008   ovaries also   CESAREAN SECTION  1994   COLONOSCOPY     COLONOSCOPY WITH PROPOFOL  N/A 02/12/2018   Procedure: COLONOSCOPY WITH PROPOFOL ;  Surgeon: Toledo, Ladell POUR, MD;  Location: [facility] ENDOSCOPY;  Service: Gastroenterology;  Laterality: N/A;   ESOPHAGOGASTRODUODENOSCOPY (EGD) WITH PROPOFOL  N/A 02/12/2018   Procedure: ESOPHAGOGASTRODUODENOSCOPY (EGD) WITH PROPOFOL ;  Surgeon: Toledo, Ladell POUR, MD;  Location: [facility] ENDOSCOPY;  Service: Gastroenterology;  Laterality: N/A;   EYE SURGERY Left 1973   muscle shortening to straighten cross eyes   LAPAROSCOPY N/A 02/20/2018   Procedure: LAPAROSCOPY DIAGNOSTIC, ( RESECTION OF RIGHT LOWER QUADRANT ABDOMINAL MASS);  Surgeon: Nicholaus Selinda Birmingham, MD;  Location: [facility] ORS;  Service: General;  Laterality: N/A;   ROTATOR CUFF REPAIR Left 2011   Patient Active Problem List   Diagnosis Date  Noted   Depression with anxiety 01/04/2024   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   Acute CVA (cerebrovascular accident) (HCC) 12/20/2023   Primary hypertension 12/20/2023   Acute kidney injury superimposed on chronic kidney disease (HCC) 12/20/2023   DM type 2 with diabetic mixed hyperlipidemia (HCC) 11/17/2022   Major depressive disorder, recurrent, 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  Rationale for Evaluation and Treatment: Rehabilitation  SUBJECTIVE:   SUBJECTIVE STATEMENT: Pt. Reports that she used to follow car racing Pt accompanied by: self and family member  PERTINENT HISTORY: Pt. Is a 68 yo female with hx of a L MCA CVA. Pt. Was admitted into ED on 12/20/2023 with an onset of RUE weakness, numbness dysmetria, and visual deficits. Upon assessment, it was concluded that Pt. Experienced multiple infarcts in the L Parietal/Occipital region. PMHx: Dizziness, Falls  PRECAUTIONS: None  WEIGHT BEARING RESTRICTIONS: No  PAIN:  Are you having pain?  03/12/2024: 6/10 pain at the R dorsal forearm during  gross grip strengthening task. 03/10/2024: No pain today. 03/05/2024: No pain, 2-3/10 level of discomfort during use of green resisitve clips.    FALLS: Has patient fallen in last 6 months? Yes. Number of falls 10  LIVING ENVIRONMENT: Lives with: lives with their family and lives alone Lives in: House/apartment Stairs: No, Ramp Has following equipment at home: Walker - 2 wheeled, Shower bench, and bed side commode  PLOF: Independent  PATIENT GOALS: To be able to feed  herself and hold a fork and knife better.   OBJECTIVE:  Note: Objective measures were completed at Evaluation unless otherwise noted.  HAND DOMINANCE: Left  ADLs: Overall ADLs:  Transfers/ambulation related to ADLs:  Eating: Independent  Grooming: independent UB Dressing: Pt. Has difficulty managing buttons LB Dressing: jean zippers are difficult to manipulate, hiking pants, and clothing negotiation are difficult. Toileting: Independent Bathing: Independent Tub Shower transfers: stepping into shower is difficult.  Equipment: Grab bars, shower bench, bedside commode  IADLs: Shopping: Does not currently do that/has not tried Light housekeeping: Does not currently do that/has not tried Meal Prep: Has not cooked in months, did not prior to CVA, sister provides meals. Community mobility: Relies on family, and friends Medication management: Assistance from sister to initiate medication management, she gives herself a shot for diabetes Financial management: Sister is assisting with monthly bill management Handwriting: Cursive form is 10% legible; Printed form is 90% legible  MOBILITY STATUS: Needs Assist: CGA and Hx of falls  POSTURE COMMENTS:  No Significant postural limitations Sitting balance: Good  ACTIVITY TOLERANCE: Activity tolerance: Good  FUNCTIONAL OUTCOME MEASURES: MAM-20: TBD  UPPER EXTREMITY ROM:    Active ROM Right eval Right 03/03/2024 Left Eval [facility]  Shoulder flexion 50(58) 61(74)   Shoulder abduction 43(48) 45(58)   Shoulder adduction     Shoulder extension     Shoulder internal rotation     Shoulder external rotation     Elbow flexion 118(131) 125(128)   Elbow extension -24(-22) -5(0)   Wrist flexion 20 (21) 42(48)   Wrist extension 30(31) 30(34)   Wrist ulnar deviation     Wrist radial deviation     Wrist pronation     Wrist supination     (Blank rows = not tested)  UPPER EXTREMITY MMT:     MMT Right eval Right 03/03/2024 Left eval   Shoulder flexion 2/5 2/5 WFL  Shoulder abduction 2/5 2/5 [facility]  Shoulder adduction     Shoulder extension     Shoulder internal rotation     Shoulder external rotation     Middle trapezius     Lower trapezius     Elbow flexion 4-/5 4/5 WFL  Elbow extension 4-/5 4/5 WFL  Wrist flexion 2+/5 4-/5 WFL  Wrist extension 3-/5 4-/5 WFL  Wrist ulnar deviation     Wrist radial deviation     Wrist pronation     Wrist supination     (Blank rows = not tested)  HAND FUNCTION: Grip strength: Right: 21 lbs; Left: 14 lbs, Lateral pinch: Right: 7 lbs, Left: 4 lbs, and 3 point pinch: Right: 4 lbs, Left: 5 lbs  03/03/2024:  Grip strength: Right: 23#, Left: 28#, Lateral pinch: Right: 6#, Left: 5#, 3 point pinch: Right: 6#, Left: 3#  COORDINATION: 9 Hole Peg test: Right: 72 sec; Left: 40 sec  03/03/2024: 9 hole peg test: Right: 1 min 3 sec, Left: 36 sec  SENSATION: Light touch: Impaired  Proprioception: Impaired   EDEMA: none  MUSCLE TONE:  RUE: Mild  COGNITION: Overall cognitive status: Impaired  VISION: Subjective report: Pt. Has not noticed the changes in vision but her sister has. Caregiver reports that Pt. Does not bring much attention to the R side.  Baseline vision:  Visual history:   VISION ASSESSMENT: TBD   PERCEPTION: TBD  PRAXIS: Impaired: Motor planning                                                                                                                           TREATMENT DATE: 03/19/2024      Therapeutic Ex.:  -Pt. Performed AAROM reps of shoulder flexion using a doorway pulley with constant monitoring, ans assist for repositioning of the RUE . -Pt. Worked on BUE strengthening, and reciprocal motion using the UBE while seated for 8 min. with no resistance.   Neuromuscular re-education:  -Facilitated left [facility] skills using the Jamar Tweezer Dexterity Task using resistive tweezers to pick up 1 thin sticks from a horizontal position, and sustaining the grasp  while preparing to place them into a vertical position with the pegboard placed at a flat tabletop surface. -Pt. Worked on grasping, and storing the 1 thin sticks in the palm of her hand,         PATIENT EDUCATION: Education details: BUE functioning, [facility] Person educated: Patient and Sister Education method: Explanation, Demonstration, Tactile cues, and Verbal cues Education comprehension: verbalized understanding and returned demonstration  HOME EXERCISE PROGRAM: Yellow theraputty HEP   GOALS: Goals reviewed with patient? Yes  SHORT TERM GOALS: Target date: 03/10/2024    Pt. Will be independent utilizing HEPs for hand strengthening exercises.  Baseline: Eval: No current HEP program Goal status: INITIAL   LONG TERM GOALS: Target date: 04/21/2024   Pt. Will improve BUE grip strength by 5# of force to be able to securely hold items. Baseline:  03/03/2024: R grip strength: 23#, L grip strength: 28#. Pt. has difficulty securely holding items in her hand. Eval: R grip strength: 21#, L grip strength: 14# Goal status: progressing, ongoing  2.  Pt. Will improve RUE [facility] skills by 3 sec. to be able to manipulate zippers/buttons. Baseline: 03/03/2024: 9 hole peg test: R side: 1 min 3 sec, L side: 36 sec. 9 hole peg test; R side: 72 secs, L side: 40 secs Goal status: progressing, ongoing  3.  Pt. Will increase R shoulder flex/shoulder ABD ROM by 10 degrees to perform ADLs/IADLs.  Baseline: 03/03/2024: R shoulder flex: 61(74), R shoulder ABD: 45(58) R shoulder flex: 50(58), R shoulder ABD: 43(48) Goal status: progressing, ongoing  4.  Pt. Will improve handwriting to 50% legible in cursive form, legibility to be able to send written correspondence Baseline: 03/03/2024: 50% legible in cursive form, Eval: 10% legible in cursive form, 90% legible in printed form. Goal status: Discontinue goal per Pt. report that it has returned to baseline.  5.  Pt. Will improve BUE pinch strength by 2# of  force to  be able to  assist with hiking pants Baseline: 03/03/2024: Lateral Pinch: R: 6#, L: 5#, 3 pt pinch: R: 6#, L: 3#.  Eval: Lateral Pinch: 7, R 3 pt. Pinch: 4, L Lateral Pinch: 4, L 3 pt. Pinch: 5 Goal status: partially met  6.  Pt. Will increase R wrist extension/flexion by 5 degrees in anticipation of reaching hold of items for ADLs. Baseline: 03/03/2024: R wrist ext: 30 (34), R Wrist flex: 42(48) Eval: R Wrist ext: 20(21), R Wrist flex: 30(31) Goal status: Continue  ASSESSMENT:  CLINICAL IMPRESSION:  Pt.  Was able to demonstrate good technique using the pulley for AAROM of the left shoulder. However, required cues, and assist for complete set-up, trunk position and UE alignment, and hand position. Pt. Declined to have one at home, as she has no one to help her set it up consistently. Pt. Required assist initially with the RUE, and constant monitoring of it on the UBE. Pt. Initially dropped multiple pegs from the tweezers when attempting to prepare to place them vertically into the pegboard after picking them up from a horizontal position 2/2 modulation accuracy of tweezer pressure. Pt. continues to benefit from OT services to work on RUE ROM, RUE strengthening, bilateral grip strength, bilateral pinch strength, bilateral [facility] skills, and Right sided awareness and attention in order to improve functional independence of ADL/IADL tasks at home.   PERFORMANCE DEFICITS: in functional skills including ADLs, IADLs, coordination, dexterity, proprioception, sensation, tone, ROM, strength, pain, Fine motor control, Gross motor control, endurance, and UE functional use, and psychosocial skills including environmental adaptation, habits, interpersonal interactions, and routines and behaviors.   IMPAIRMENTS: are limiting patient from ADLs, IADLs, rest and sleep, work, leisure, and social participation.   CO-MORBIDITIES: may have co-morbidities  that affects occupational performance. Patient will benefit  from skilled OT to address above impairments and improve overall function.  MODIFICATION OR ASSISTANCE TO COMPLETE EVALUATION: Min-Moderate modification of tasks or assist with assess necessary to complete an evaluation.  OT OCCUPATIONAL PROFILE AND HISTORY: Detailed assessment: Review of records and additional review of physical, cognitive, psychosocial history related to current functional performance.  CLINICAL DECISION MAKING: Moderate - several treatment options, min-mod task modification necessary  REHAB POTENTIAL: Good  EVALUATION COMPLEXITY: Moderate    PLAN:  OT FREQUENCY: 2x/week  OT DURATION: 12 weeks  PLANNED INTERVENTIONS: 97535 self care/ADL training, 02889 therapeutic exercise, 97530 therapeutic activity, 97112 neuromuscular re-education, 97018 paraffin, 02989 moist heat, 97010 cryotherapy, 97034 contrast bath, 97032 electrical stimulation (manual), passive range of motion, visual/perceptual remediation/compensation, energy conservation, patient/family education, and DME and/or AE instructions  RECOMMENDED OTHER SERVICES: ST & PT  CONSULTED AND AGREED WITH PLAN OF CARE: Patient and family member/caregiver  PLAN FOR NEXT SESSION: treatment   Charonda Hefter, MS, OTR/L   03/19/2024, 6:31 PM

## 2024-03-19 NOTE — Therapy (Signed)
 OUTPATIENT SPEECH LANGUAGE PATHOLOGY  COGNITIVE-COMMUNICATION TREATMENT   Patient Name: Haley Jones MRN: [id number] DOB:15-Sep-1955, 68 y.o., female Today's Date: 03/19/2024  PCP: Cleotilde Oneil FALCON, MD  REFERRING PROVIDER: Pegge Toribio PARAS, PA-C    End of Session - 03/19/24 1315     Visit Number 14    Number of Visits 25    Date for SLP Re-Evaluation 04/27/24    Authorization Type [employer]    Authorization Time Period 01/28/2024 thru 04/27/2024    Authorization - Visit Number 14    Authorization - Number of Visits 24    Progress Note Due on Visit 20    SLP Start Time 1315    SLP Stop Time  1400    SLP Time Calculation (min) 45 min    Activity Tolerance Patient tolerated treatment well          Past Medical History:  Diagnosis Date   Adnexal mass    Anxiety    Chronic back pain    Depression    Diabetes mellitus without complication (HCC)    Endometriosis 03/12/2018   GERD (gastroesophageal reflux disease)    Heart murmur 02/2018   undetected until she was an adult. 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   Weight loss 12/06/2017   Past Surgical History:  Procedure Laterality Date   ABDOMINAL HYSTERECTOMY  2008   ovaries also   CESAREAN SECTION  1994   COLONOSCOPY     COLONOSCOPY WITH PROPOFOL  N/A 02/12/2018   Procedure: COLONOSCOPY WITH PROPOFOL ;  Surgeon: Toledo, Ladell POUR, MD;  Location: [facility] ENDOSCOPY;  Service: Gastroenterology;  Laterality: N/A;   ESOPHAGOGASTRODUODENOSCOPY (EGD) WITH PROPOFOL  N/A 02/12/2018   Procedure: ESOPHAGOGASTRODUODENOSCOPY (EGD) WITH PROPOFOL ;  Surgeon: Toledo, Ladell POUR, MD;  Location: [facility] ENDOSCOPY;  Service: Gastroenterology;  Laterality: N/A;   EYE SURGERY Left 1973   muscle shortening to straighten cross eyes   LAPAROSCOPY N/A 02/20/2018   Procedure: LAPAROSCOPY DIAGNOSTIC, ( RESECTION OF RIGHT LOWER QUADRANT ABDOMINAL MASS);   Surgeon: Nicholaus Selinda Birmingham, MD;  Location: [facility] ORS;  Service: General;  Laterality: N/A;   ROTATOR CUFF REPAIR Left 2011   Patient Active Problem List   Diagnosis Date Noted   Urinary incontinence as sequela of cerebrovascular accident (CVA) 02/11/2024   Depression with anxiety 01/04/2024   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   Acute CVA (cerebrovascular accident) (HCC) 12/20/2023   Primary hypertension 12/20/2023   Acute kidney injury superimposed on chronic kidney disease (HCC) 12/20/2023   DM type 2 with diabetic mixed hyperlipidemia (HCC) 11/17/2022   Major depressive disorder, recurrent, 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/17/2022   B12 deficiency 02/23/2021   SBO (small bowel obstruction) (HCC) 01/29/2019   Barrett's esophagus without dysplasia 04/15/2018   Weight loss 12/06/2017   Hoarse 12/06/2017   Lumbar disc disease 07/26/2017   Diabetes mellitus type 2, controlled, without complications (HCC) 02/09/2015   Surgical menopause 11/26/2014    ONSET DATE: 12/20/23   REFERRING DIAG: CVA  THERAPY DIAG:  Cognitive communication deficit  Rationale for Evaluation and Treatment Rehabilitation  SUBJECTIVE:   PERTINENT HISTORY: Haley Jones is a 68 year old left-handed female who [facility] to [facility]23 with right side weakness/falls and dizziness. MRI of the brain showed patchy acute cortical/subcortical infarcts within the left parietal occipital lobes individually measuring up to 3 cm (MCA vascular territory). Subtle petechial hemorrhage associated with some of these infarcts.  No frank hemorrhagic conversion.  Additional punctate left MCA territory cortical acute infarct within the left insula.  Background parenchymal atrophy and chronic small vessel ischemic disease with chronic lacunar infarcts.  CT angio showed severe stenosis/nonocclusive thrombus of the left MCA bifurcation affecting the inferior division.  Nonocclusive embolus also visible within the left parietal branch as  well. Echocardiogram ejection fraction of 60 to 65% no wall motion abnormalities grade 1 diastolic dysfunction. Past medical history also noted for hypertension, B12 deficiency, type 2 diabetes mellitus, hyperlipidemia, CKD stage III, depression.  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 D/c home after CIR admission 12/28/23-01/18/24. Sister lives nearby and checks on patient throughout the day, assists with meals, bathing, and medications.  DIAGNOSTIC FINDINGS: see above  PAIN:  Are you having pain? No   FALLS: Has patient fallen in last 6 months?  Yes, Number of falls: 1 since returning home from rehab, multiple prior to hospitalization  LIVING ENVIRONMENT: Lives with: lives alone Lives in: House/apartment  PLOF:  Level of assistance: Independent with IADLs Employment: Retired   PATIENT GOALS   Pt states she wants to drive again  SUBJECTIVE STATEMENT: Pt alert, cooperative. Talkative about the rain Pt accompanied by: self  OBJECTIVE:   TODAY'S TREATMENT: Skilled treatment session focused on pt's cognitive communication goals. SLP facilitated session by providing the following interventions:  Pt with independently recalled therapy sessions (ST, PT, OT) today  99% accuracy when crossing out underlined letter in each line  Constant Therapy Clinician App utilized Find the same symbols: Level 7  - 83% Tell if the Same is the Same: Level 1 - 60% suspect d/t pt impulsivity   PATIENT EDUCATION: Education details: as above Person educated: Patient Education method: Verbal cues Education comprehension: verbalized understanding and needs further education   HOME EXERCISE PROGRAM:   See above and put list of therapy sessions on August Calendar - new calendar printed  GOALS:  Goals reviewed with patient? Yes  SHORT TERM GOALS: Target date: 10 sessions            Updated 03/05/2024 The patient will  sustain attention in simple cognitive linguistic task for 5 minutes given min verbal cues.  Baseline: Goal status: INITIAL: MET: upgraded to With Min A, pt will demonstrate alternating attention between 2 basic tasks in 5 out of 7 opportunities.    2.  Patient will reduce impulsivity by preplanning steps before completing a task with moderate verbal cues. Baseline:  Goal status: INITIAL: MET   3.  Patient will complete Patient Reported Outcome Measure to assess self-perception of deficits.  Baseline:  Goal status: INITIAL: MET   4.  With Min A, patient will complete complex visual-spatial activities with 75% accuracy.               Baseline:               Goal status: INITIAL   5. With Mod I, pt will recall and follow therapy schedule with 80% accuracy.               Baseline:              Goal status: INITIAL         LONG TERM GOALS: Target date: 04/27/2024             Updated 03/05/2024 Patient will alternate attention between two moderately complex tasks 80% acc, with min verbal  cues.  Baseline:  Goal status: INITIAL: great progress made   2.  Patient will demonstrate emergent awareness by identifying 75% of errors with min cues for double checking. Baseline:  Goal status: INITIAL: great progress made   3.  Patient will recall safety precautions/mobility recommendations using visual aids if necessary to reduce risk for falls. Baseline:  Goal status: INITIAL: great progress made   ASSESSMENT:  CLINICAL IMPRESSION: Based on recent assessment, Haley Jones presents with overall moderate cognitive communication impairment per standardized testing using the Cognitive Linguistic Quick Test. Primary deficit areas (severe) include attention, executive function, and visuospatial skills. Patient's impulsivity contributed significantly to testing errors, as did poor sustained attention to testing instructions. Impulsivity impacting safety at home, contributing to fall at home.  Patient and sister report reduced frustration tolerance. She is often frustrated when being given instructions. Patient demonstrated some emergent awareness of errors on tasks such as mazes, but was unaware of perseverations in design generation and generative naming, and errors in clock drawing, symbol cancellation, and symbol trails tasks.   Pt eager throughout session to target higher level cognitive skills in the hopes of returning to driving. See the above treatment note for details on progress.   OBJECTIVE IMPAIRMENTS include attention, memory, awareness, and executive functioning. These impairments are limiting patient from managing medications, managing appointments, managing finances, household responsibilities, ADLs/IADLs, and effectively communicating at home and in community. Factors affecting potential to achieve goals and functional outcome are ability to learn/carryover information and severity of impairments. Patient will benefit from skilled SLP services to address above impairments and improve overall function.  REHAB POTENTIAL: Good  PLAN: SLP FREQUENCY: 1-2x/week  SLP DURATION: 12 weeks  PLANNED INTERVENTIONS: Environmental controls, Cueing hierachy, Cognitive reorganization, Internal/external aids, Functional tasks, SLP instruction and feedback, Compensatory strategies, Patient/family education, and [id number] Treatment of speech (30 or 45 min)    Taeshawn Helfman B. Rubbie, M.S., CCC-SLP, Tree surgeon Certified Brain Injury Specialist [facility]  [facility] Rehabilitation Services Office [phone] Ascom [phone] Fax [phone]

## 2024-03-19 NOTE — Therapy (Signed)
 OUTPATIENT PHYSICAL THERAPY TREATMENT   Patient Name: Haley Jones MRN: [id number] DOB:19-Jun-1956, 68 y.o., female Today's Date: 03/19/2024  PCP: Dr. Oneil Pinal REFERRING PROVIDER: Toribio Pitch, PA-C  END OF SESSION:   PT End of Session - 03/19/24 1536     Visit Number 16    Number of Visits 24    Date for PT Re-Evaluation 04/21/24    Progress Note Due on Visit 20    PT Start Time 1400    PT Stop Time 1441    PT Time Calculation (min) 41 min    Equipment Utilized During Treatment Gait belt    Activity Tolerance Patient tolerated treatment well    Behavior During Therapy WFL for tasks assessed/performed                Past Medical History:  Diagnosis Date   Adnexal mass    Anxiety    Chronic back pain    Depression    Diabetes mellitus without complication (HCC)    Endometriosis 03/12/2018   GERD (gastroesophageal reflux disease)    Heart murmur 02/2018   undetected until she was an adult. 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   Weight loss 12/06/2017   Past Surgical History:  Procedure Laterality Date   ABDOMINAL HYSTERECTOMY  2008   ovaries also   CESAREAN SECTION  1994   COLONOSCOPY     COLONOSCOPY WITH PROPOFOL  N/A 02/12/2018   Procedure: COLONOSCOPY WITH PROPOFOL ;  Surgeon: Toledo, Ladell POUR, MD;  Location: [facility];  Service: Gastroenterology;  Laterality: N/A;   ESOPHAGOGASTRODUODENOSCOPY (EGD) WITH PROPOFOL  N/A 02/12/2018   Procedure: ESOPHAGOGASTRODUODENOSCOPY (EGD) WITH PROPOFOL ;  Surgeon: Toledo, Ladell POUR, MD;  Location: [facility];  Service: Gastroenterology;  Laterality: N/A;   EYE SURGERY Left 1973   muscle shortening to straighten cross eyes   LAPAROSCOPY N/A 02/20/2018   Procedure: LAPAROSCOPY DIAGNOSTIC, ( RESECTION OF RIGHT LOWER QUADRANT ABDOMINAL MASS);  Surgeon: Nicholaus Selinda Birmingham, MD;  Location: [facility];  Service: General;   Laterality: N/A;   ROTATOR CUFF REPAIR Left 2011   Patient Active Problem List   Diagnosis Date Noted   Urinary incontinence as sequela of cerebrovascular accident (CVA) 02/11/2024   Depression with anxiety 01/04/2024   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   Acute CVA (cerebrovascular accident) (HCC) 12/20/2023   Primary hypertension 12/20/2023   Acute kidney injury superimposed on chronic kidney disease (HCC) 12/20/2023   DM type 2 with diabetic mixed hyperlipidemia (HCC) 11/17/2022   Major depressive disorder, recurrent, 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/17/2022   B12 deficiency 02/23/2021   SBO (small bowel obstruction) (HCC) 01/29/2019   Barrett's esophagus without dysplasia 04/15/2018   Weight loss 12/06/2017   Hoarse 12/06/2017   Lumbar disc disease 07/26/2017   Diabetes mellitus type 2, controlled, without complications (HCC) 02/09/2015   Surgical menopause 11/26/2014    ONSET DATE: 12/20/2023  REFERRING DIAG: P36.487 (ICD-10-CM) - Left middle cerebral artery stroke (HCC)   THERAPY DIAG:  Abnormality of gait  Unsteadiness on feet  Hemiplegia and hemiparesis following cerebral infarction affecting right dominant side (HCC)  Rationale for Evaluation and Treatment: Rehabilitation  SUBJECTIVE:  SUBJECTIVE STATEMENT:   Denies any falls since last session. Still a little weary of walking since near fall but was glad PT was able to prevent fall last session.   PERTINENT HISTORY:Pt states she is not sure the exact date of when her CVA happened because she kept falling at home trying to care for herself and her daughter, but she finally went to the hospital when her sister insisted on it. Pt states her special needs daughter, [name], passed away recently while pt was in the hospital at [facility] before she was  transferred to [facility] for stroke rehabilitation.Pt's sister states pt requires supervision/cuing for ADLs  to don clothes correctly, with correct orientation (potential apraxia?) Pt/sister report pt has R side inattention.Pt reports supposedly one of her legs is shorter than the other, believes her R LE might be the shorter one. 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     PAIN:  Are you having pain? No  PRECAUTIONS: Fall RED FLAGS: None  WEIGHT BEARING RESTRICTIONS: No FALLS: Has patient fallen in last 6 months? Yes. Number of falls 6+ before going to hospital and 1 fall since being home from CIR - pt states she was trying to gather her clothes by herself rather than waiting for her sister to get there to help her  LIVING ENVIRONMENT Lives with: lives alone and but pt's sister, [name], comes over daily to assist with ADLs and stays to provide supervision all day - pt states at night she would be able to walk to bathroom using RW if needed (but she wears depends in event of incontinence) Lives in: House/apartment Stairs: she has steps, but she doesn't have to use them (house was wheelchair accessible for her daughter)  Has following equipment at home: Environmental consultant - 2 wheeled, shower chair, Grab bars, Ramped entry, and transport chair  PLOF: Independent, Independent with household mobility without device, Independent with homemaking with ambulation, Independent with gait, Independent with transfers, and she was the primary caregiver for her recently deceased daughter   CLOF: Sister provides supervision daily for pt to ambulate using RW safely and provides assist for ADLs (showering and dressing - progressing towards supervision with these activities); Sister states pt requires cues to turn and step back fully prior to sitting to ensure her safety  PATIENT GOALS: get back to being able to go shopping with improved community level  ambulation without the walker; return to driving   OBJECTIVE:  Note: Objective measures were completed at Evaluation unless otherwise noted.  DIAGNOSTIC FINDINGS:   EXAM: MRI HEAD WITHOUT CONTRAST   TECHNIQUE: Multiplanar, multiecho pulse sequences of the brain and surrounding structures were obtained without intravenous contrast.   COMPARISON:  Brain MRI 07/31/2022.  IMPRESSION: 1. Patchy acute cortical/subcortical infarcts within the left parietal and occipital lobes individually measuring up to 3 cm (MCA vascular territory). Subtle petechial hemorrhage associated with some of these infarcts. No frank hemorrhagic conversion. 2. Additional punctate left MCA territory cortical acute infarct within the left insula. 3. Background parenchymal atrophy and chronic small vessel ischemic disease with chronic lacunar infarcts, as described. 4. Paranasal sinus disease as outlined (including severe right maxillary sinusitis).   Electronically Signed   By: Rockey Childs D.O.   On: 12/20/2023 11:52  COGNITION: Overall cognitive status: Within functional limits for tasks assessed   SENSATION: Light touch: initially misses the first 1-2 touches on R LE, but then able to feel remaining  touches Proprioception: Impaired grossly  COORDINATION: Symmetrical heel-to-shin bilaterally  EDEMA:  Not formally assessed, but none observed  MUSCLE TONE: Not formally assessed  POSTURE: rounded shoulders, forward head, and posterior pelvic tilt  LOWER EXTREMITY MMT:    MMT Right Eval Left Eval  Hip flexion 3+, no pain 3+ with some L hip and back pain with this  Hip extension    Hip abduction    Hip adduction    Hip internal rotation    Hip external rotation    Knee flexion 3+ 4  Knee extension 4- 4  Ankle dorsiflexion 3+ 4  Ankle plantarflexion 4- 4  Ankle inversion    Ankle eversion    (Blank rows = not tested)  BED MOBILITY:  Findings: Sit to supine need to assess Supine to  sit need to assess *pt reports some difficulty with bed mobility  TRANSFERS: Sit to stand: SBA  Assistive device utilized: Environmental consultant - 2 wheeled     Stand to sit: SBA  Assistive device utilized: Environmental consultant - 2 wheeled     Chair to chair: SBA  Assistive device utilized: Environmental consultant - 2 wheeled       GAIT: Findings: Gait Characteristics: slight R ankle instability noted, step through pattern, decreased stance time- Right, decreased stride length, and decreased ankle dorsiflexion- Right, Distance walked: ~177ft, Assistive device utilized:Walker - 2 wheeled, Level of assistance: SBA and CGA, and Comments:    FUNCTIONAL TESTS:  5 times sit to stand: 38.75 seconds, using UE support 10 meter walk test: 0.48 m/s using youth RW, requires CGA for safety 6 minute walk test: Visit 2, see note Berg Balance Scale: Visit 2, see note Functional gait assessment: need to assess, when appropriate  PATIENT SURVEYS:  ABC scale 7.5%                                                                                                                             TREATMENT DATE: 03/19/2024  Unless otherwise stated, CGA was provided and gait belt donned in order to ensure pt safety    TA- To improve functional movements patterns for everyday tasks    Gait x 340 ft with 3# AW and RW -  STS x 10 no UE Gait x 340 ft with RW and cues for faster than comfortable pace  STS x 10 no UE with 3# AW  [facility]one navigation no AD x 3 laps   *1 lap = 1 time each way to return to start point  Step up without UE support then step down with UE assist x 12 ea LE, HEAVY verbal cues for proper form  Side step up with UE assist x 10 ea, cues for proper foot positioning  Side stepping without AD x 10 ft x 3 laps   Gait with RW at fast pace x 250 ft to get to OT to end session    PATIENT EDUCATION: Education details: PT POC, findings on assessment today,  recommendation to continue using RW for all functional mobility Person educated: Patient  and Caregiver [name] Education method: Explanation Education comprehension: verbalized understanding and needs further education  HOME EXERCISE PROGRAM: Access Code: [email or web address] URL: [email or web address] Date: 02/04/2024 Prepared by: Peggye Linear  Exercises - Standing March with Counter Support  - 2 x daily - 7 x weekly - 2 sets - 20 reps - Side stepping with counter support: yellow band loop  - 2 x daily - 7 x weekly - 2 sets - 20 reps - Sit to Stand with Counter Support  - 2 x daily - 7 x weekly - 2 sets - 12 reps   GOALS: Goals reviewed with patient? Yes  SHORT TERM GOALS: Target date: 03/10/2024  Pt will be independent with HEP in order to improve strength and balance in order to decrease fall risk and improve function at home and work.  Baseline: need to initiate Goal status:  ONGOING: 7/16: 2-3x week  LONG TERM GOALS: Target date: 04/21/2024  1.  Patient will complete five times sit to stand test in < 15 seconds indicating an increased LE strength and improved balance. Baseline:  38.75 seconds, using UE support, 7/16: 20.35 sec, UE support on 1st STS only Goal Status: ONGOING   2.  Patient will increase ABC scale score >80% to demonstrate better functional mobility and better confidence with ADLs.   Baseline: 7.5%, 7/16: 18.125% Goal status: INITIAL   3.  Patient will increase Berg Balance score by > 6 points to demonstrate decreased fall risk during functional activities. Baseline: need to assess; 01/30/2024= 34/56, 7/16= 39 Goal status: INITIAL   4. Patient will increase 10 meter walk test to >1.72m/s as to improve gait speed for better community ambulation and to reduce fall risk. Baseline: 0.48 m/s using youth RW, requires CGA for safety, 7/16: 0.69 m/s  Goal status: INITIAL  5. Patient will increase six minute walk test distance to >1000 for progression to community ambulator and improve gait ability Baseline: need to assess; 01/30/2024=410 feet using RW;  02/13/24: 472ft in 79m30s, 7/16: 660ft using 2WW Goal status: ONGOING   ASSESSMENT:  CLINICAL IMPRESSION:    Patient arrived with good motivation for completion of pt activities.  Session focused on BLE strengthening and endurance. Pt challenged with shorter bouts of unassisted gait this date. Pt noted ot have much difficulty with LE positional sense requiring cues and ended up kicking cones a lot during intervention involving cone weaving.  Pt able to increase spee of gait with RW without imbalance showing improved functional capacity but still requires cues to maintain this speed.  Pt will continue to benefit from skilled physical therapy intervention to address impairments, improve QOL, and attain therapy goals.     OBJECTIVE IMPAIRMENTS: Abnormal gait, decreased activity tolerance, decreased balance, decreased endurance, decreased knowledge of use of DME, decreased mobility, difficulty walking, decreased strength, decreased safety awareness, impaired vision/preception, and pain.   ACTIVITY LIMITATIONS: carrying, lifting, bending, standing, squatting, stairs, transfers, bed mobility, bathing, toileting, dressing, reach over head, hygiene/grooming, and locomotion level  PARTICIPATION LIMITATIONS: meal prep, cleaning, laundry, driving, shopping, and community activity  PERSONAL FACTORS: Age, Time since onset of injury/illness/exacerbation, and 3+ comorbidities: Hypertension, B12 deficiency, type 2 diabetes, hyperlipidemia, CKD stage IIIa are also affecting patient's functional outcome.   REHAB POTENTIAL: Good  CLINICAL DECISION MAKING: Evolving/moderate complexity  EVALUATION COMPLEXITY: Moderate  PLAN:  PT FREQUENCY: 1-2x/week  PT DURATION: 12 weeks  PLANNED INTERVENTIONS: 97164- PT Re-evaluation, 97750- Physical Performance Testing,  97110-Therapeutic exercises, 97530- Therapeutic activity, [id number]- Neuromuscular re-education, [phone]- Self Care, [id number]- Manual therapy, [phone]- Gait training,  [phone]- Orthotic Initial, [phone]- Orthotic/Prosthetic subsequent, [phone]- Canalith repositioning, [phone]- Electrical stimulation (manual), Patient/Family education, Balance training, Stair training, Taping, Joint mobilization, Spinal mobilization, Vestibular training, Visual/preceptual remediation/compensation, DME instructions, Cryotherapy, Moist heat, and Biofeedback  PLAN FOR NEXT SESSION:  - FGA when appropriate and add LTG - floor transfers training - continue HIIT - continue gait training without AD for balance and righting (include multi plane, multidirectional)  - dynamic reaching and dynamic stepping balance    Note: Portions of this document were prepared using Dragon voice recognition software and although reviewed may contain unintentional dictation errors in syntax, grammar, or spelling.  Lonni KATHEE Gainer PT ,DPT Physical Therapist- [place]  [facility]    3:37 PM, 03/19/24

## 2024-03-24 ENCOUNTER — Encounter: Payer: Self-pay | Admitting: Occupational Therapy

## 2024-03-24 ENCOUNTER — Ambulatory Visit: Admitting: Speech Pathology

## 2024-03-24 ENCOUNTER — Ambulatory Visit: Admitting: Occupational Therapy

## 2024-03-24 ENCOUNTER — Ambulatory Visit: Admitting: Physical Therapy

## 2024-03-24 DIAGNOSIS — R41841 Cognitive communication deficit: Secondary | ICD-10-CM | POA: Diagnosis not present

## 2024-03-24 DIAGNOSIS — M6281 Muscle weakness (generalized): Secondary | ICD-10-CM

## 2024-03-24 DIAGNOSIS — R269 Unspecified abnormalities of gait and mobility: Secondary | ICD-10-CM

## 2024-03-24 DIAGNOSIS — I69351 Hemiplegia and hemiparesis following cerebral infarction affecting right dominant side: Secondary | ICD-10-CM

## 2024-03-24 DIAGNOSIS — R278 Other lack of coordination: Secondary | ICD-10-CM

## 2024-03-24 DIAGNOSIS — R2681 Unsteadiness on feet: Secondary | ICD-10-CM

## 2024-03-24 NOTE — Therapy (Signed)
 Occupational Therapy Treatment Note   Patient Name: Haley Jones MRN: [id number] DOB:1955/09/14, 68 y.o., female Today's Date: 03/24/2024  PCP: Cleotilde Oneil FALCON, MD REFERRING PROVIDER: Pegge Toribio PARAS, PA-C   OT End of Session - 03/24/24 1449     Visit Number 17    Number of Visits 24    Date for OT Re-Evaluation 04/22/24    OT Start Time 1445    OT Stop Time 1530    OT Time Calculation (min) 45 min    Activity Tolerance Patient tolerated treatment well    Behavior During Therapy [facility] for tasks assessed/performed                Past Medical History:  Diagnosis Date   Adnexal mass    Anxiety    Chronic back pain    Depression    Diabetes mellitus without complication (HCC)    Endometriosis 03/12/2018   GERD (gastroesophageal reflux disease)    Heart murmur 02/2018   undetected until she was an adult. 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   Weight loss 12/06/2017   Past Surgical History:  Procedure Laterality Date   ABDOMINAL HYSTERECTOMY  2008   ovaries also   CESAREAN SECTION  1994   COLONOSCOPY     COLONOSCOPY WITH PROPOFOL  N/A 02/12/2018   Procedure: COLONOSCOPY WITH PROPOFOL ;  Surgeon: Toledo, Ladell POUR, MD;  Location: [facility];  Service: Gastroenterology;  Laterality: N/A;   ESOPHAGOGASTRODUODENOSCOPY (EGD) WITH PROPOFOL  N/A 02/12/2018   Procedure: ESOPHAGOGASTRODUODENOSCOPY (EGD) WITH PROPOFOL ;  Surgeon: Toledo, Ladell POUR, MD;  Location: [facility];  Service: Gastroenterology;  Laterality: N/A;   EYE SURGERY Left 1973   muscle shortening to straighten cross eyes   LAPAROSCOPY N/A 02/20/2018   Procedure: LAPAROSCOPY DIAGNOSTIC, ( RESECTION OF RIGHT LOWER QUADRANT ABDOMINAL MASS);  Surgeon: Nicholaus Selinda Birmingham, MD;  Location: [facility] ORS;  Service: General;  Laterality: N/A;   ROTATOR CUFF REPAIR Left 2011   Patient Active Problem List   Diagnosis Date  Noted   Depression with anxiety 01/04/2024   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   Acute CVA (cerebrovascular accident) (HCC) 12/20/2023   Primary hypertension 12/20/2023   Acute kidney injury superimposed on chronic kidney disease (HCC) 12/20/2023   DM type 2 with diabetic mixed hyperlipidemia (HCC) 11/17/2022   Major depressive disorder, recurrent, 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  Rationale for Evaluation and Treatment: Rehabilitation  SUBJECTIVE:   SUBJECTIVE STATEMENT: Pt. Reports that she uses her theraputty a few times a week.  Pt accompanied by: self and family member  PERTINENT HISTORY: Pt. Is a 68 yo female with hx of a L MCA CVA. Pt. Was admitted into ED on 12/20/2023 with an onset of RUE weakness, numbness dysmetria, and visual deficits. Upon assessment, it was concluded that Pt. Experienced multiple infarcts in the L Parietal/Occipital region. PMHx: Dizziness, Falls  PRECAUTIONS: None  WEIGHT BEARING RESTRICTIONS: No  PAIN:  Are you having pain?  03/24/24: no pain 03/12/2024: 6/10  pain at the R dorsal forearm during gross grip strengthening task. 03/10/2024: No pain today. 03/05/2024: No pain, 2-3/10 level of discomfort during use of green resisitve clips.    FALLS: Has patient fallen in last 6 months? Yes. Number of falls 10  LIVING ENVIRONMENT: Lives with: lives with their family and lives alone Lives in: House/apartment Stairs: No, Ramp Has following equipment at home: Walker - 2 wheeled, Shower bench, and bed side commode  PLOF:  Independent  PATIENT GOALS: To be able to feed herself and hold a fork and knife better.   OBJECTIVE:  Note: Objective measures were completed at Evaluation unless otherwise noted.  HAND DOMINANCE: Left  ADLs: Overall ADLs:  Transfers/ambulation related to ADLs:  Eating: Independent  Grooming: independent UB Dressing: Pt. Has difficulty managing buttons LB Dressing: jean zippers are difficult to manipulate, hiking pants, and clothing negotiation are difficult. Toileting: Independent Bathing: Independent Tub Shower transfers: stepping into shower is difficult.  Equipment: Grab bars, shower bench, bedside commode  IADLs: Shopping: Does not currently do that/has not tried Light housekeeping: Does not currently do that/has not tried Meal Prep: Has not cooked in months, did not prior to CVA, sister provides meals. Community mobility: Relies on family, and friends Medication management: Assistance from sister to initiate medication management, she gives herself a shot for diabetes Financial management: Sister is assisting with monthly bill management Handwriting: Cursive form is 10% legible; Printed form is 90% legible  MOBILITY STATUS: Needs Assist: CGA and Hx of falls  POSTURE COMMENTS:  No Significant postural limitations Sitting balance: Good  ACTIVITY TOLERANCE: Activity tolerance: Good  FUNCTIONAL OUTCOME MEASURES: MAM-20: TBD  UPPER EXTREMITY ROM:    Active ROM Right eval Right 03/03/2024 Left Eval [facility]  Shoulder flexion 50(58) 61(74)   Shoulder abduction 43(48) 45(58)   Shoulder adduction     Shoulder extension     Shoulder internal rotation     Shoulder external rotation     Elbow flexion 118(131) 125(128)   Elbow extension -24(-22) -5(0)   Wrist flexion 20 (21) 42(48)   Wrist extension 30(31) 30(34)   Wrist ulnar deviation     Wrist radial deviation     Wrist pronation     Wrist supination     (Blank rows = not tested)  UPPER EXTREMITY MMT:     MMT  Right eval Right 03/03/2024 Left eval  Shoulder flexion 2/5 2/5 WFL  Shoulder abduction 2/5 2/5 [facility]  Shoulder adduction     Shoulder extension     Shoulder internal rotation     Shoulder external rotation     Middle trapezius     Lower trapezius     Elbow flexion 4-/5 4/5 WFL  Elbow extension 4-/5 4/5 WFL  Wrist flexion 2+/5 4-/5 WFL  Wrist extension 3-/5 4-/5 WFL  Wrist ulnar deviation     Wrist radial deviation     Wrist pronation     Wrist supination     (Blank rows = not tested)  HAND FUNCTION: Grip strength: Right: 21 lbs; Left: 14 lbs, Lateral pinch: Right: 7 lbs, Left: 4 lbs, and 3 point pinch: Right: 4 lbs, Left: 5 lbs  03/03/2024:  Grip strength: Right: 23#, Left: 28#, Lateral pinch: Right: 6#, Left: 5#, 3 point pinch: Right: 6#, Left: 3#  COORDINATION: 9 Hole Peg test: Right: 72 sec; Left: 40 sec  03/03/2024: 9 hole peg test: Right: 1 min 3 sec, Left: 36 sec  SENSATION: Light touch: Impaired  Proprioception: Impaired  EDEMA: none  MUSCLE TONE: RUE: Mild  COGNITION: Overall cognitive status: Impaired  VISION: Subjective report: Pt. Has not noticed the changes in vision but her sister has. Caregiver reports that Pt. Does not bring much attention to the R side.  Baseline vision:  Visual history:   VISION ASSESSMENT: TBD   PERCEPTION: TBD  PRAXIS: Impaired: Motor planning                                                                                                                           TREATMENT DATE: 03/24/2024      Therapeutic Ex.:  -Pt performed grip strengthening exercises using blue theraputty for 2 set x 10 rep each: gross gripping, isolated thumb flexion, lateral pinch, and digit abdutcion -Lateral Pinch strengthening using green and blue level resistive clips with difficulty using blue   Neuromuscular re-education:  -Pt worked on [facility] skills using Purdue peg board to perform translatory movements moving each peg and washer through  the hand from the palm to the tip of the 2nd digit and thumb in preparation for placing them in the pegboard following a design pattern.  Self Care: -Utilized fork with built up grip to simulate cutting meat - noted increased difficulty sequencing task with grip vs without grip. Pt reports dropping fork however appears to be from fatigue.        PATIENT EDUCATION: Education details: BUE functioning, [facility] Person educated: Patient and Sister Education method: Explanation, Demonstration, Tactile cues, and Verbal cues Education comprehension: verbalized understanding and returned demonstration  HOME EXERCISE PROGRAM: Yellow theraputty HEP   GOALS: Goals reviewed with patient? Yes  SHORT TERM GOALS: Target date: 03/10/2024    Pt. Will be independent utilizing HEPs for hand strengthening exercises.  Baseline: Eval: No current HEP program Goal status: INITIAL   LONG TERM GOALS: Target date: 04/21/2024   Pt. Will improve BUE grip strength by 5# of force to be able to securely hold items. Baseline:  03/03/2024: R grip strength: 23#, L grip strength: 28#. Pt. has difficulty securely holding items in her hand. Eval: R grip strength: 21#, L grip strength: 14# Goal status: progressing, ongoing  2.  Pt. Will improve RUE [facility] skills by 3 sec. to be able to manipulate zippers/buttons. Baseline: 03/03/2024: 9 hole peg test: R side: 1 min 3 sec, L side: 36 sec. 9 hole peg test; R side: 72 secs, L side: 40 secs Goal status: progressing, ongoing  3.  Pt. Will increase R shoulder flex/shoulder ABD ROM by 10 degrees to perform ADLs/IADLs.  Baseline: 03/03/2024: R shoulder flex: 61(74), R shoulder ABD: 45(58) R shoulder flex: 50(58), R shoulder ABD: 43(48) Goal status: progressing, ongoing  4.  Pt. Will improve handwriting to 50% legible in cursive form, legibility to be able to send written correspondence Baseline: 03/03/2024: 50% legible in cursive form, Eval: 10% legible in cursive form, 90% legible  in printed form. Goal status: Discontinue goal per Pt. report that it has returned  to baseline.  5.  Pt. Will improve BUE pinch strength by 2# of force to  be able to assist with hiking pants Baseline: 03/03/2024: Lateral Pinch: R: 6#, L: 5#, 3 pt pinch: R: 6#, L: 3#.  Eval: Lateral Pinch: 7, R 3 pt. Pinch: 4, L Lateral Pinch: 4, L 3 pt. Pinch: 5 Goal status: partially met  6.  Pt. Will increase R wrist extension/flexion by 5 degrees in anticipation of reaching hold of items for ADLs. Baseline: 03/03/2024: R wrist ext: 30 (34), R Wrist flex: 42(48) Eval: R Wrist ext: 20(21), R Wrist flex: 30(31) Goal status: Continue  ASSESSMENT:  CLINICAL IMPRESSION: Pt completed Alberts test for unilateral spatial neglect with no deficits noted however in functional context pt repeatedly uses dominant L hand to complete activities despite multiple multi-modal cues to utilize R hand. Question hearing impairments involved. Good tolerance and technique for blue theraputty exercises - pt uncertain what color she has at home and will check prior to issuing blue putty for HEP. Continues to drop small items during translatory movements with Purdue pegboard and require cues to utilize R hand only. Pt. continues to benefit from OT services to work on RUE ROM, RUE strengthening, bilateral grip strength, bilateral pinch strength, bilateral [facility] skills, and Right sided awareness and attention in order to improve functional independence of ADL/IADL tasks at home.   PERFORMANCE DEFICITS: in functional skills including ADLs, IADLs, coordination, dexterity, proprioception, sensation, tone, ROM, strength, pain, Fine motor control, Gross motor control, endurance, and UE functional use, and psychosocial skills including environmental adaptation, habits, interpersonal interactions, and routines and behaviors.   IMPAIRMENTS: are limiting patient from ADLs, IADLs, rest and sleep, work, leisure, and social participation.    CO-MORBIDITIES: may have co-morbidities  that affects occupational performance. Patient will benefit from skilled OT to address above impairments and improve overall function.  MODIFICATION OR ASSISTANCE TO COMPLETE EVALUATION: Min-Moderate modification of tasks or assist with assess necessary to complete an evaluation.  OT OCCUPATIONAL PROFILE AND HISTORY: Detailed assessment: Review of records and additional review of physical, cognitive, psychosocial history related to current functional performance.  CLINICAL DECISION MAKING: Moderate - several treatment options, min-mod task modification necessary  REHAB POTENTIAL: Good  EVALUATION COMPLEXITY: Moderate    PLAN:  OT FREQUENCY: 2x/week  OT DURATION: 12 weeks  PLANNED INTERVENTIONS: 97535 self care/ADL training, 02889 therapeutic exercise, 97530 therapeutic activity, 97112 neuromuscular re-education, 97018 paraffin, 02989 moist heat, 97010 cryotherapy, 97034 contrast bath, 97032 electrical stimulation (manual), passive range of motion, visual/perceptual remediation/compensation, energy conservation, patient/family education, and DME and/or AE instructions  RECOMMENDED OTHER SERVICES: ST & PT  CONSULTED AND AGREED WITH PLAN OF CARE: Patient and family member/caregiver  PLAN FOR NEXT SESSION: treatment   Elston Slot, M.S. OTR/L  03/24/24, 3:22 PM  ascom [phone]   03/24/2024, 3:22 PM
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
## 2024-03-24 NOTE — Therapy (Signed)
 OUTPATIENT PHYSICAL THERAPY TREATMENT   Patient Name: Haley Jones MRN: [id number] DOB:04/25/1956, 68 y.o., female Today's Date: 03/24/2024  PCP: Dr. Oneil Pinal REFERRING PROVIDER: Toribio Pitch, PA-C  END OF SESSION:   PT End of Session - 03/24/24 1359     Visit Number 17    Number of Visits 24    Date for PT Re-Evaluation 04/21/24    Progress Note Due on Visit 20    PT Start Time 1400    PT Stop Time 1444    PT Time Calculation (min) 44 min    Equipment Utilized During Treatment Gait belt    Activity Tolerance Patient tolerated treatment well    Behavior During Therapy WFL for tasks assessed/performed                Past Medical History:  Diagnosis Date   Adnexal mass    Anxiety    Chronic back pain    Depression    Diabetes mellitus without complication (HCC)    Endometriosis 03/12/2018   GERD (gastroesophageal reflux disease)    Heart murmur 02/2018   undetected until she was an adult. 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   Weight loss 12/06/2017   Past Surgical History:  Procedure Laterality Date   ABDOMINAL HYSTERECTOMY  2008   ovaries also   CESAREAN SECTION  1994   COLONOSCOPY     COLONOSCOPY WITH PROPOFOL  N/A 02/12/2018   Procedure: COLONOSCOPY WITH PROPOFOL ;  Surgeon: Toledo, Ladell POUR, MD;  Location: [facility];  Service: Gastroenterology;  Laterality: N/A;   ESOPHAGOGASTRODUODENOSCOPY (EGD) WITH PROPOFOL  N/A 02/12/2018   Procedure: ESOPHAGOGASTRODUODENOSCOPY (EGD) WITH PROPOFOL ;  Surgeon: Toledo, Ladell POUR, MD;  Location: [facility];  Service: Gastroenterology;  Laterality: N/A;   EYE SURGERY Left 1973   muscle shortening to straighten cross eyes   LAPAROSCOPY N/A 02/20/2018   Procedure: LAPAROSCOPY DIAGNOSTIC, ( RESECTION OF RIGHT LOWER QUADRANT ABDOMINAL MASS);  Surgeon: Nicholaus Selinda Birmingham, MD;  Location: [facility] ORS;  Service: General;   Laterality: N/A;   ROTATOR CUFF REPAIR Left 2011   Patient Active Problem List   Diagnosis Date Noted   Urinary incontinence as sequela of cerebrovascular accident (CVA) 02/11/2024   Depression with anxiety 01/04/2024   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   Acute CVA (cerebrovascular accident) (HCC) 12/20/2023   Primary hypertension 12/20/2023   Acute kidney injury superimposed on chronic kidney disease (HCC) 12/20/2023   DM type 2 with diabetic mixed hyperlipidemia (HCC) 11/17/2022   Major depressive disorder, recurrent, 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/17/2022   B12 deficiency 02/23/2021   SBO (small bowel obstruction) (HCC) 01/29/2019   Barrett's esophagus without dysplasia 04/15/2018   Weight loss 12/06/2017   Hoarse 12/06/2017   Lumbar disc disease 07/26/2017   Diabetes mellitus type 2, controlled, without complications (HCC) 02/09/2015   Surgical menopause 11/26/2014    ONSET DATE: 12/20/2023  REFERRING DIAG: P36.487 (ICD-10-CM) - Left middle cerebral artery stroke (HCC)   THERAPY DIAG:  Abnormality of gait  Unsteadiness on feet  Rationale for Evaluation and Treatment: Rehabilitation  SUBJECTIVE:  SUBJECTIVE STATEMENT:   Denies any falls since last session.  Still has been walking exclusively with her walker when at home due to fear of falling.  PERTINENT HISTORY:Pt states she is not sure the exact date of when her CVA happened because she kept falling at home trying to care for herself and her daughter, but she finally went to the hospital when her sister insisted on it. Pt states her special needs daughter, [name], passed away recently while pt was in the hospital at [facility] before she was transferred to [facility] for stroke rehabilitation.Pt's sister states pt requires supervision/cuing  for ADLs  to don clothes correctly, with correct orientation (potential apraxia?) Pt/sister report pt has R side inattention.Pt reports supposedly one of her legs is shorter than the other, believes her R LE might be the shorter one. 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     PAIN:  Are you having pain? No  PRECAUTIONS: Fall RED FLAGS: None  WEIGHT BEARING RESTRICTIONS: No FALLS: Has patient fallen in last 6 months? Yes. Number of falls 6+ before going to hospital and 1 fall since being home from CIR - pt states she was trying to gather her clothes by herself rather than waiting for her sister to get there to help her  LIVING ENVIRONMENT Lives with: lives alone and but pt's sister, [name], comes over daily to assist with ADLs and stays to provide supervision all day - pt states at night she would be able to walk to bathroom using RW if needed (but she wears depends in event of incontinence) Lives in: House/apartment Stairs: she has steps, but she doesn't have to use them (house was wheelchair accessible for her daughter)  Has following equipment at home: Environmental consultant - 2 wheeled, shower chair, Grab bars, Ramped entry, and transport chair  PLOF: Independent, Independent with household mobility without device, Independent with homemaking with ambulation, Independent with gait, Independent with transfers, and she was the primary caregiver for her recently deceased daughter   CLOF: Sister provides supervision daily for pt to ambulate using RW safely and provides assist for ADLs (showering and dressing - progressing towards supervision with these activities); Sister states pt requires cues to turn and step back fully prior to sitting to ensure her safety  PATIENT GOALS: get back to being able to go shopping with improved community level ambulation without the walker; return to driving   OBJECTIVE:  Note: Objective measures were completed at  Evaluation unless otherwise noted.  DIAGNOSTIC FINDINGS:   EXAM: MRI HEAD WITHOUT CONTRAST   TECHNIQUE: Multiplanar, multiecho pulse sequences of the brain and surrounding structures were obtained without intravenous contrast.   COMPARISON:  Brain MRI 07/31/2022.  IMPRESSION: 1. Patchy acute cortical/subcortical infarcts within the left parietal and occipital lobes individually measuring up to 3 cm (MCA vascular territory). Subtle petechial hemorrhage associated with some of these infarcts. No frank hemorrhagic conversion. 2. Additional punctate left MCA territory cortical acute infarct within the left insula. 3. Background parenchymal atrophy and chronic small vessel ischemic disease with chronic lacunar infarcts, as described. 4. Paranasal sinus disease as outlined (including severe right maxillary sinusitis).   Electronically Signed   By: Rockey Childs D.O.   On: 12/20/2023 11:52  COGNITION: Overall cognitive status: Within functional limits for tasks assessed   SENSATION: Light touch: initially misses the first 1-2 touches on R LE, but then able to feel remaining touches Proprioception: Impaired grossly  COORDINATION: Symmetrical heel-to-shin bilaterally  EDEMA:  Not formally assessed, but none observed  MUSCLE TONE: Not formally assessed  POSTURE: rounded shoulders, forward head, and posterior pelvic tilt  LOWER EXTREMITY MMT:    MMT Right Eval Left Eval  Hip flexion 3+, no pain 3+ with some L hip and back pain with this  Hip extension    Hip abduction    Hip adduction    Hip internal rotation    Hip external rotation    Knee flexion 3+ 4  Knee extension 4- 4  Ankle dorsiflexion 3+ 4  Ankle plantarflexion 4- 4  Ankle inversion    Ankle eversion    (Blank rows = not tested)  BED MOBILITY:  Findings: Sit to supine need to assess Supine to sit need to assess *pt reports some difficulty with bed mobility  TRANSFERS: Sit to stand: SBA  Assistive  device utilized: Environmental consultant - 2 wheeled     Stand to sit: SBA  Assistive device utilized: Environmental consultant - 2 wheeled     Chair to chair: SBA  Assistive device utilized: Environmental consultant - 2 wheeled       GAIT: Findings: Gait Characteristics: slight R ankle instability noted, step through pattern, decreased stance time- Right, decreased stride length, and decreased ankle dorsiflexion- Right, Distance walked: ~157ft, Assistive device utilized:Walker - 2 wheeled, Level of assistance: SBA and CGA, and Comments:    FUNCTIONAL TESTS:  5 times sit to stand: 38.75 seconds, using UE support 10 meter walk test: 0.48 m/s using youth RW, requires CGA for safety 6 minute walk test: Visit 2, see note Berg Balance Scale: Visit 2, see note Functional gait assessment: need to assess, when appropriate  PATIENT SURVEYS:  ABC scale 7.5%                                                                                                                             TREATMENT DATE: 03/24/2024  Unless otherwise stated, CGA was provided and gait belt donned in order to ensure pt safety    TA- To improve functional movements patterns for everyday tasks    Gait x 340 ft with 3# AW and RW -  STS x 12 no UE on airex pad Gait x 340 ft with RW and cues for faster than comfortable pace  STS x 12 no UE with 3# AW on airex pad Gait x 340 ft with RW and cues for faster than comfortable pace   Cone navigation no AD 2 x 3 laps   *1 lap = 1 time each way to return to start point  - when object on pt right side pt has high difficulty attending to it.    NMR: To facilitate reeducation of movement, balance, posture, coordination, and/or proprioception/kinesthetic sense.  Activity Description: Blaze pods set up primarily anterior and to right of patient while patient stands on Airex pad and taps blaze pods with her left and right hands.  Focus here  is to improve patient's attention to right side in order to improve her object navigation skills in  ambulation Activity Setting: Random Number of Pods:  5 Cycles/Sets:  3    PATIENT EDUCATION: Education details: PT POC, findings on assessment today, recommendation to continue using RW for all functional mobility Person educated: Patient and Caregiver [name] Education method: Explanation Education comprehension: verbalized understanding and needs further education  HOME EXERCISE PROGRAM: Access Code: [email or web address] URL: [email or web address] Date: 02/04/2024 Prepared by: Peggye Linear  Exercises - Standing March with Counter Support  - 2 x daily - 7 x weekly - 2 sets - 20 reps - Side stepping with counter support: yellow band loop  - 2 x daily - 7 x weekly - 2 sets - 20 reps - Sit to Stand with Counter Support  - 2 x daily - 7 x weekly - 2 sets - 12 reps   GOALS: Goals reviewed with patient? Yes  SHORT TERM GOALS: Target date: 03/10/2024  Pt will be independent with HEP in order to improve strength and balance in order to decrease fall risk and improve function at home and work.  Baseline: need to initiate Goal status:  ONGOING: 7/16: 2-3x week  LONG TERM GOALS: Target date: 04/21/2024  1.  Patient will complete five times sit to stand test in < 15 seconds indicating an increased LE strength and improved balance. Baseline:  38.75 seconds, using UE support, 7/16: 20.35 sec, UE support on 1st STS only Goal Status: ONGOING   2.  Patient will increase ABC scale score >80% to demonstrate better functional mobility and better confidence with ADLs.   Baseline: 7.5%, 7/16: 18.125% Goal status: INITIAL   3.  Patient will increase Berg Balance score by > 6 points to demonstrate decreased fall risk during functional activities. Baseline: need to assess; 01/30/2024= 34/56, 7/16= 39 Goal status: INITIAL   4. Patient will increase 10 meter walk test to >1.39m/s as to improve gait speed for better community ambulation and to reduce fall risk. Baseline: 0.48 m/s using youth RW,  requires CGA for safety, 7/16: 0.69 m/s  Goal status: INITIAL  5. Patient will increase six minute walk test distance to >1000 for progression to community ambulator and improve gait ability Baseline: need to assess; 01/30/2024=410 feet using RW; 02/13/24: 452ft in 12m30s, 7/16: 610ft using 2WW Goal status: ONGOING   ASSESSMENT:  CLINICAL IMPRESSION:    Patient presents with good motivation for completion of physical therapy activities.  Patient still very hesitant to complete gait without her walker.  Patient does show improved stability this date showing no overt loss of balance but 1 fair response where she was very scared of falling although no correction was required by physical therapist.  Close contact-guard assist provided throughout.  Patient does show improvement in gait speed when challenged with gait with 3 pound ankle weights this date and with her walker.  Will continue to incorporate interventions to improve attention to her right side particularly with gait and ambulation as we progress. Pt will continue to benefit from skilled physical therapy intervention to address impairments, improve QOL, and attain therapy goals.     OBJECTIVE IMPAIRMENTS: Abnormal gait, decreased activity tolerance, decreased balance, decreased endurance, decreased knowledge of use of DME, decreased mobility, difficulty walking, decreased strength, decreased safety awareness, impaired vision/preception, and pain.   ACTIVITY LIMITATIONS: carrying, lifting, bending, standing, squatting, stairs, transfers, bed mobility, bathing, toileting, dressing, reach over head, hygiene/grooming, and locomotion level  PARTICIPATION LIMITATIONS: meal prep, cleaning,  laundry, driving, shopping, and community activity  PERSONAL FACTORS: Age, Time since onset of injury/illness/exacerbation, and 3+ comorbidities: Hypertension, B12 deficiency, type 2 diabetes, hyperlipidemia, CKD stage IIIa are also affecting patient's functional  outcome.   REHAB POTENTIAL: Good  CLINICAL DECISION MAKING: Evolving/moderate complexity  EVALUATION COMPLEXITY: Moderate  PLAN:  PT FREQUENCY: 1-2x/week  PT DURATION: 12 weeks  PLANNED INTERVENTIONS: [id number]- PT Re-evaluation, 97750- Physical Performance Testing, 97110-Therapeutic exercises, 97530- Therapeutic activity, [id number]- Neuromuscular re-education, 97535- Self Care, [id number]- Manual therapy, [id number]- Gait training, [phone]- Orthotic Initial, [phone]- Orthotic/Prosthetic subsequent, [phone]- Canalith repositioning, [phone]- Electrical stimulation (manual), Patient/Family education, Balance training, Stair training, Taping, Joint mobilization, Spinal mobilization, Vestibular training, Visual/preceptual remediation/compensation, DME instructions, Cryotherapy, Moist heat, and Biofeedback  PLAN FOR NEXT SESSION:  - FGA when appropriate and add LTG - floor transfers training - continue HIIT - continue gait training without AD for balance and righting (include multi plane, multidirectional)  - dynamic reaching and dynamic stepping balance    Note: Portions of this document were prepared using Dragon voice recognition software and although reviewed may contain unintentional dictation errors in syntax, grammar, or spelling.  Lonni KATHEE Gainer PT ,DPT Physical Therapist- [place]  [facility]    2:00 PM, 03/24/24

## 2024-03-26 ENCOUNTER — Encounter: Payer: Self-pay | Admitting: Occupational Therapy

## 2024-03-26 ENCOUNTER — Ambulatory Visit: Admitting: Physical Therapy

## 2024-03-26 ENCOUNTER — Ambulatory Visit: Admitting: Occupational Therapy

## 2024-03-26 ENCOUNTER — Ambulatory Visit: Admitting: Speech Pathology

## 2024-03-26 DIAGNOSIS — R41841 Cognitive communication deficit: Secondary | ICD-10-CM | POA: Diagnosis not present

## 2024-03-26 DIAGNOSIS — M6281 Muscle weakness (generalized): Secondary | ICD-10-CM

## 2024-03-26 DIAGNOSIS — R269 Unspecified abnormalities of gait and mobility: Secondary | ICD-10-CM

## 2024-03-26 DIAGNOSIS — R2681 Unsteadiness on feet: Secondary | ICD-10-CM

## 2024-03-26 DIAGNOSIS — R278 Other lack of coordination: Secondary | ICD-10-CM

## 2024-03-26 DIAGNOSIS — I69351 Hemiplegia and hemiparesis following cerebral infarction affecting right dominant side: Secondary | ICD-10-CM

## 2024-03-26 NOTE — Therapy (Signed)
 OUTPATIENT SPEECH LANGUAGE PATHOLOGY  COGNITIVE-COMMUNICATION TREATMENT   Patient Name: Haley Jones MRN: [id number] DOB:May 17, 1956, 68 y.o., female Today's Date: 03/26/2024  PCP: Haley Jones FALCON, MD  REFERRING PROVIDER: Pegge Jones PARAS, PA-C    End of Session - 03/26/24 1323     Visit Number 16    Number of Visits 25    Date for SLP Re-Evaluation 04/27/24    Authorization Type [employer]    Authorization Time Period 01/28/2024 thru 04/27/2024    Authorization - Visit Number 16    Authorization - Number of Visits 24    Progress Note Due on Visit 20    SLP Start Time 1315    SLP Stop Time  1400    SLP Time Calculation (min) 45 min    Activity Tolerance Patient tolerated treatment well          Past Medical History:  Diagnosis Date   Adnexal mass    Anxiety    Chronic back pain    Depression    Diabetes mellitus without complication (HCC)    Endometriosis 03/12/2018   GERD (gastroesophageal reflux disease)    Heart murmur 02/2018   undetected until she was an adult. 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   Weight loss 12/06/2017   Past Surgical History:  Procedure Laterality Date   ABDOMINAL HYSTERECTOMY  2008   ovaries also   CESAREAN SECTION  1994   COLONOSCOPY     COLONOSCOPY WITH PROPOFOL  N/A 02/12/2018   Procedure: COLONOSCOPY WITH PROPOFOL ;  Surgeon: Toledo, Ladell POUR, MD;  Location: [facility];  Service: Gastroenterology;  Laterality: N/A;   ESOPHAGOGASTRODUODENOSCOPY (EGD) WITH PROPOFOL  N/A 02/12/2018   Procedure: ESOPHAGOGASTRODUODENOSCOPY (EGD) WITH PROPOFOL ;  Surgeon: Toledo, Ladell POUR, MD;  Location: [facility];  Service: Gastroenterology;  Laterality: N/A;   EYE SURGERY Left 1973   muscle shortening to straighten cross eyes   LAPAROSCOPY N/A 02/20/2018   Procedure: LAPAROSCOPY DIAGNOSTIC, ( RESECTION OF RIGHT LOWER QUADRANT ABDOMINAL MASS);   Surgeon: Nicholaus Selinda Birmingham, MD;  Location: [facility] ORS;  Service: General;  Laterality: N/A;   ROTATOR CUFF REPAIR Left 2011   Patient Active Problem List   Diagnosis Date Noted   Urinary incontinence as sequela of cerebrovascular accident (CVA) 02/11/2024   Depression with anxiety 01/04/2024   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   Acute CVA (cerebrovascular accident) (HCC) 12/20/2023   Primary hypertension 12/20/2023   Acute kidney injury superimposed on chronic kidney disease (HCC) 12/20/2023   DM type 2 with diabetic mixed hyperlipidemia (HCC) 11/17/2022   Major depressive disorder, recurrent, 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/17/2022   B12 deficiency 02/23/2021   SBO (small bowel obstruction) (HCC) 01/29/2019   Barrett's esophagus without dysplasia 04/15/2018   Weight loss 12/06/2017   Hoarse 12/06/2017   Lumbar disc disease 07/26/2017   Diabetes mellitus type 2, controlled, without complications (HCC) 02/09/2015   Surgical menopause 11/26/2014    ONSET DATE: 12/20/23   REFERRING DIAG: CVA  THERAPY DIAG:  Cognitive communication deficit  Rationale for Evaluation and Treatment Rehabilitation  SUBJECTIVE:   PERTINENT HISTORY: Haley Jones is a 67 year old left-handed female who [facility] to [facility]23 with right side weakness/falls and dizziness. MRI of the brain showed patchy acute cortical/subcortical infarcts within the left parietal occipital lobes individually measuring up to 3 cm (MCA vascular territory). Subtle petechial hemorrhage associated with some of these infarcts.  No frank hemorrhagic conversion.  Additional punctate left MCA territory cortical acute infarct within the left insula.  Background parenchymal atrophy and chronic small vessel ischemic disease with chronic lacunar infarcts.  CT angio showed severe stenosis/nonocclusive thrombus of the left MCA bifurcation affecting the inferior division.  Nonocclusive embolus also visible within the left parietal branch as  well. Echocardiogram ejection fraction of 60 to 65% no wall motion abnormalities grade 1 diastolic dysfunction. Past medical history also noted for hypertension, B12 deficiency, type 2 diabetes mellitus, hyperlipidemia, CKD stage III, depression.  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 D/c home after CIR admission 12/28/23-01/18/24. Sister lives nearby and checks on patient throughout the day, assists with meals, bathing, and medications.  DIAGNOSTIC FINDINGS: see above  PAIN:  Are you having pain? No   FALLS: Has patient fallen in last 6 months?  Yes, Number of falls: 1 since returning home from rehab, multiple prior to hospitalization  LIVING ENVIRONMENT: Lives with: lives alone Lives in: House/apartment  PLOF:  Level of assistance: Independent with IADLs Employment: Retired   PATIENT GOALS   Pt states she wants to drive again  SUBJECTIVE STATEMENT: Pt alert, cooperative.  Pt accompanied by: self  OBJECTIVE:   TODAY'S TREATMENT: Skilled treatment session focused on pt's cognitive communication goals. SLP facilitated session by providing the following interventions:  Pt reports I dropped 3 pills with additional questions she reports dropping them yesterday when she was going to take them  Pt benefited from rare Min A to provide 3 items within semi-complex categories, specifically naming more than 2 items  PATIENT EDUCATION: Education details: as above Person educated: Patient Education method: Verbal cues Education comprehension: verbalized understanding and needs further education   HOME EXERCISE PROGRAM:   See above and put list of therapy sessions on August Calendar - new calendar printed  GOALS:  Goals reviewed with patient? Yes  SHORT TERM GOALS: Target date: 10 sessions            Updated 03/05/2024 The patient will sustain attention in simple cognitive linguistic task for 5  minutes given min verbal cues.  Baseline: Goal status: INITIAL: MET: upgraded to With Min A, pt will demonstrate alternating attention between 2 basic tasks in 5 out of 7 opportunities.    2.  Patient will reduce impulsivity by preplanning steps before completing a task with moderate verbal cues. Baseline:  Goal status: INITIAL: MET   3.  Patient will complete Patient Reported Outcome Measure to assess self-perception of deficits.  Baseline:  Goal status: INITIAL: MET   4.  With Min A, patient will complete complex visual-spatial activities with 75% accuracy.               Baseline:               Goal status: INITIAL   5. With Mod I, pt will recall and follow therapy schedule with 80% accuracy.               Baseline:              Goal status: INITIAL         LONG TERM GOALS: Target date: 04/27/2024             Updated 03/05/2024 Patient will alternate attention between two moderately complex tasks 80% acc, with min verbal cues.  Baseline:  Goal status: INITIAL: great progress made   2.  Patient will demonstrate emergent awareness by identifying 75% of errors with min cues for double checking. Baseline:  Goal status: INITIAL: great progress made   3.  Patient will recall safety precautions/mobility recommendations using visual aids if necessary to reduce risk for falls. Baseline:  Goal status: INITIAL: great progress made   ASSESSMENT:  CLINICAL IMPRESSION: Based on recent assessment, Kya Mayfield presents with overall moderate cognitive communication impairment per standardized testing using the Cognitive Linguistic Quick Test. Primary deficit areas (severe) include attention, executive function, and visuospatial skills. Patient's impulsivity contributed significantly to testing errors, as did poor sustained attention to testing instructions. Impulsivity impacting safety at home, contributing to fall at home. Patient and sister report reduced frustration tolerance. She is  often frustrated when being given instructions. Patient demonstrated some emergent awareness of errors on tasks such as mazes, but was unaware of perseverations in design generation and generative naming, and errors in clock drawing, symbol cancellation, and symbol trails tasks.   Pt eager throughout session to target higher level cognitive skills in the hopes of returning to driving. See the above treatment note for details on progress.   OBJECTIVE IMPAIRMENTS include attention, memory, awareness, and executive functioning. These impairments are limiting patient from managing medications, managing appointments, managing finances, household responsibilities, ADLs/IADLs, and effectively communicating at home and in community. Factors affecting potential to achieve goals and functional outcome are ability to learn/carryover information and severity of impairments. Patient will benefit from skilled SLP services to address above impairments and improve overall function.  REHAB POTENTIAL: Good  PLAN: SLP FREQUENCY: 1-2x/week  SLP DURATION: 12 weeks  PLANNED INTERVENTIONS: Environmental controls, Cueing hierachy, Cognitive reorganization, Internal/external aids, Functional tasks, SLP instruction and feedback, Compensatory strategies, Patient/family education, and [id number] Treatment of speech (30 or 45 min)    Mancel Lardizabal B. Rubbie, M.S., CCC-SLP, Tree surgeon Certified Brain Injury Specialist [facility]  [facility] Rehabilitation Services Office [phone] Ascom [phone] Fax [phone]

## 2024-03-26 NOTE — Therapy (Signed)
 OUTPATIENT PHYSICAL THERAPY TREATMENT   Patient Name: Haley Jones MRN: [id number] DOB:[birth year], 68 y.o., female Today's Date: 03/26/2024  PCP: Dr. Oneil Pinal REFERRING PROVIDER: Toribio Pitch, PA-C  END OF SESSION:   PT End of Session - 03/26/24 1413     Visit Number 18    Number of Visits 24    Date for PT Re-Evaluation 04/21/24    Progress Note Due on Visit 20    PT Start Time 1400    PT Stop Time 1443    PT Time Calculation (min) 43 min    Equipment Utilized During Treatment Gait belt    Activity Tolerance Patient tolerated treatment well    Behavior During Therapy WFL for tasks assessed/performed                Past Medical History:  Diagnosis Date   Adnexal mass    Anxiety    Chronic back pain    Depression    Diabetes mellitus without complication (HCC)    Endometriosis 03/12/2018   GERD (gastroesophageal reflux disease)    Heart murmur 02/2018   undetected until she was an adult. 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   Weight loss 12/06/2017   Past Surgical History:  Procedure Laterality Date   ABDOMINAL HYSTERECTOMY  2008   ovaries also   CESAREAN SECTION  1994   COLONOSCOPY     COLONOSCOPY WITH PROPOFOL  N/A 02/12/2018   Procedure: COLONOSCOPY WITH PROPOFOL ;  Surgeon: Toledo, Ladell POUR, MD;  Location: [facility];  Service: Gastroenterology;  Laterality: N/A;   ESOPHAGOGASTRODUODENOSCOPY (EGD) WITH PROPOFOL  N/A 02/12/2018   Procedure: ESOPHAGOGASTRODUODENOSCOPY (EGD) WITH PROPOFOL ;  Surgeon: Toledo, Ladell POUR, MD;  Location: [facility];  Service: Gastroenterology;  Laterality: N/A;   EYE SURGERY Left 1973   muscle shortening to straighten cross eyes   LAPAROSCOPY N/A 02/20/2018   Procedure: LAPAROSCOPY DIAGNOSTIC, ( RESECTION OF RIGHT LOWER QUADRANT ABDOMINAL MASS);  Surgeon: Nicholaus Selinda Birmingham, MD;  Location: [facility];  Service: General;   Laterality: N/A;   ROTATOR CUFF REPAIR Left 2011   Patient Active Problem List   Diagnosis Date Noted   Urinary incontinence as sequela of cerebrovascular accident (CVA) 02/11/2024   Depression with anxiety 01/04/2024   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   Acute CVA (cerebrovascular accident) (HCC) 12/20/2023   Primary hypertension 12/20/2023   Acute kidney injury superimposed on chronic kidney disease (HCC) 12/20/2023   DM type 2 with diabetic mixed hyperlipidemia (HCC) 11/17/2022   Major depressive disorder, recurrent, 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/17/2022   B12 deficiency 02/23/2021   SBO (small bowel obstruction) (HCC) 01/29/2019   Barrett's esophagus without dysplasia 04/15/2018   Weight loss 12/06/2017   Hoarse 12/06/2017   Lumbar disc disease 07/26/2017   Diabetes mellitus type 2, controlled, without complications (HCC) 02/09/2015   Surgical menopause 11/26/2014    ONSET DATE: 12/20/2023  REFERRING DIAG: P36.487 (ICD-10-CM) - Left middle cerebral artery stroke (HCC)   THERAPY DIAG:  Hemiplegia and hemiparesis following cerebral infarction affecting right dominant side (HCC)  Muscle weakness (generalized)  Abnormality of gait  Unsteadiness on feet  Rationale for Evaluation and Treatment: Rehabilitation  SUBJECTIVE:  SUBJECTIVE STATEMENT:   Denies any falls since last session.  Still has been walking exclusively with her walker when at home due to fear of falling.  Patient reports she is feeling well today.  PERTINENT HISTORY:Pt states she is not sure the exact date of when her CVA happened because she kept falling at home trying to care for herself and her daughter, but she finally went to the hospital when her sister insisted on it. Pt states her special needs daughter, [name], passed away  recently while pt was in the hospital at [facility] before she was transferred to [facility] for stroke rehabilitation.Pt's sister states pt requires supervision/cuing for ADLs  to don clothes correctly, with correct orientation (potential apraxia?) Pt/sister report pt has R side inattention.Pt reports supposedly one of her legs is shorter than the other, believes her R LE might be the shorter one. 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     PAIN:  Are you having pain? No  PRECAUTIONS: Fall RED FLAGS: None  WEIGHT BEARING RESTRICTIONS: No FALLS: Has patient fallen in last 6 months? Yes. Number of falls 6+ before going to hospital and 1 fall since being home from CIR - pt states she was trying to gather her clothes by herself rather than waiting for her sister to get there to help her  LIVING ENVIRONMENT Lives with: lives alone and but pt's sister, [name], comes over daily to assist with ADLs and stays to provide supervision all day - pt states at night she would be able to walk to bathroom using RW if needed (but she wears depends in event of incontinence) Lives in: House/apartment Stairs: she has steps, but she doesn't have to use them (house was wheelchair accessible for her daughter)  Has following equipment at home: Environmental consultant - 2 wheeled, shower chair, Grab bars, Ramped entry, and transport chair  PLOF: Independent, Independent with household mobility without device, Independent with homemaking with ambulation, Independent with gait, Independent with transfers, and she was the primary caregiver for her recently deceased daughter   CLOF: Sister provides supervision daily for pt to ambulate using RW safely and provides assist for ADLs (showering and dressing - progressing towards supervision with these activities); Sister states pt requires cues to turn and step back fully prior to sitting to ensure her safety  PATIENT GOALS: get back to  being able to go shopping with improved community level ambulation without the walker; return to driving   OBJECTIVE:  Note: Objective measures were completed at Evaluation unless otherwise noted.  DIAGNOSTIC FINDINGS:   EXAM: MRI HEAD WITHOUT CONTRAST   TECHNIQUE: Multiplanar, multiecho pulse sequences of the brain and surrounding structures were obtained without intravenous contrast.   COMPARISON:  Brain MRI 07/31/2022.  IMPRESSION: 1. Patchy acute cortical/subcortical infarcts within the left parietal and occipital lobes individually measuring up to 3 cm (MCA vascular territory). Subtle petechial hemorrhage associated with some of these infarcts. No frank hemorrhagic conversion. 2. Additional punctate left MCA territory cortical acute infarct within the left insula. 3. Background parenchymal atrophy and chronic small vessel ischemic disease with chronic lacunar infarcts, as described. 4. Paranasal sinus disease as outlined (including severe right maxillary sinusitis).   Electronically Signed   By: Rockey Childs D.O.   On: 12/20/2023 11:52  COGNITION: Overall cognitive status: Within functional limits for tasks assessed   SENSATION: Light touch: initially misses the first 1-2 touches on R LE, but then  able to feel remaining touches Proprioception: Impaired grossly  COORDINATION: Symmetrical heel-to-shin bilaterally  EDEMA:  Not formally assessed, but none observed  MUSCLE TONE: Not formally assessed  POSTURE: rounded shoulders, forward head, and posterior pelvic tilt  LOWER EXTREMITY MMT:    MMT Right Eval Left Eval  Hip flexion 3+, no pain 3+ with some L hip and back pain with this  Hip extension    Hip abduction    Hip adduction    Hip internal rotation    Hip external rotation    Knee flexion 3+ 4  Knee extension 4- 4  Ankle dorsiflexion 3+ 4  Ankle plantarflexion 4- 4  Ankle inversion    Ankle eversion    (Blank rows = not tested)  BED  MOBILITY:  Findings: Sit to supine need to assess Supine to sit need to assess *pt reports some difficulty with bed mobility  TRANSFERS: Sit to stand: SBA  Assistive device utilized: Environmental consultant - 2 wheeled     Stand to sit: SBA  Assistive device utilized: Environmental consultant - 2 wheeled     Chair to chair: SBA  Assistive device utilized: Environmental consultant - 2 wheeled       GAIT: Findings: Gait Characteristics: slight R ankle instability noted, step through pattern, decreased stance time- Right, decreased stride length, and decreased ankle dorsiflexion- Right, Distance walked: ~18ft, Assistive device utilized:Walker - 2 wheeled, Level of assistance: SBA and CGA, and Comments:    FUNCTIONAL TESTS:  5 times sit to stand: 38.75 seconds, using UE support 10 meter walk test: 0.48 m/s using youth RW, requires CGA for safety 6 minute walk test: Visit 2, see note Berg Balance Scale: Visit 2, see note Functional gait assessment: need to assess, when appropriate  PATIENT SURVEYS:  ABC scale 7.5%                                                                                                                             TREATMENT DATE: 03/26/2024  Unless otherwise stated, CGA was provided and gait belt donned in order to ensure pt safety    TA- To improve functional movements patterns for everyday tasks    Gait x 470 ft with 3# AW and RW - cues for faster than comfortable pace  STS x 12 no UE on airex pad Gait x 500 ft with RW and cues for faster than comfortable pace  STS x 12 no UE with 3# AW on airex pad Gait x 500 ft with RW and cues for faster than comfortable pace standing   Heel raise with UE 2 x 20 reps in standing   Gait with walker to OT area to end session x 100 ft, good balance and speed   NMR: To facilitate reeducation of movement, balance, posture, coordination, and/or proprioception/kinesthetic sense.  Activity Description: Blaze pods set up in a track around clinic to be on pts right side 2 x 3 min  30 sec rounds. Pt  tasked with remembering rounte for spatial awareness and unassisted gait with close CGA. Good balance overall with this with only 1 small LOB corrected with minA from PT.   TE- To improve strength, endurance, mobility, and function of specific targeted muscle groups or improve joint range of motion or improve muscle flexibility  LAQ 2 x 15 ea LE with 3# AW    PATIENT EDUCATION: Education details: PT POC, findings on assessment today, recommendation to continue using RW for all functional mobility Person educated: Patient and Caregiver [name] Education method: Explanation Education comprehension: verbalized understanding and needs further education  HOME EXERCISE PROGRAM: Access Code: [email or web address] URL: [email or web address] Date: 02/04/2024 Prepared by: Peggye Linear  Exercises - Standing March with Counter Support  - 2 x daily - 7 x weekly - 2 sets - 20 reps - Side stepping with counter support: yellow band loop  - 2 x daily - 7 x weekly - 2 sets - 20 reps - Sit to Stand with Counter Support  - 2 x daily - 7 x weekly - 2 sets - 12 reps   GOALS: Goals reviewed with patient? Yes  SHORT TERM GOALS: Target date: 03/10/2024  Pt will be independent with HEP in order to improve strength and balance in order to decrease fall risk and improve function at home and work.  Baseline: need to initiate Goal status:  ONGOING: 7/16: 2-3x week  LONG TERM GOALS: Target date: 04/21/2024  1.  Patient will complete five times sit to stand test in < 15 seconds indicating an increased LE strength and improved balance. Baseline:  38.75 seconds, using UE support, 7/16: 20.35 sec, UE support on 1st STS only Goal Status: ONGOING   2.  Patient will increase ABC scale score >80% to demonstrate better functional mobility and better confidence with ADLs.   Baseline: 7.5%, 7/16: 18.125% Goal status: INITIAL   3.  Patient will increase Berg Balance score by > 6 points to demonstrate  decreased fall risk during functional activities. Baseline: need to assess; 01/30/2024= 34/56, 7/16= 39 Goal status: INITIAL   4. Patient will increase 10 meter walk test to >1.36m/s as to improve gait speed for better community ambulation and to reduce fall risk. Baseline: 0.48 m/s using youth RW, requires CGA for safety, 7/16: 0.69 m/s  Goal status: INITIAL  5. Patient will increase six minute walk test distance to >1000 for progression to community ambulator and improve gait ability Baseline: need to assess; 01/30/2024=410 feet using RW; 02/13/24: 487ft in 70m30s, 7/16: 633ft using 2WW Goal status: ONGOING   ASSESSMENT:  CLINICAL IMPRESSION:    Patient presents with good motivation for completion of physical therapy activities. Patient does show improved stability this date showing no overt loss of balance with unassisted gait despite incorporated R side awareness and dual task component of blaze pods. Close contact-guard assist provided throughout.   Will continue to incorporate interventions to improve attention to her right side particularly with gait and ambulation as we progress. Pt will continue to benefit from skilled physical therapy intervention to address impairments, improve QOL, and attain therapy goals.     OBJECTIVE IMPAIRMENTS: Abnormal gait, decreased activity tolerance, decreased balance, decreased endurance, decreased knowledge of use of DME, decreased mobility, difficulty walking, decreased strength, decreased safety awareness, impaired vision/preception, and pain.   ACTIVITY LIMITATIONS: carrying, lifting, bending, standing, squatting, stairs, transfers, bed mobility, bathing, toileting, dressing, reach over head, hygiene/grooming, and locomotion level  PARTICIPATION LIMITATIONS: meal prep, cleaning, laundry, driving, shopping, and community activity  PERSONAL FACTORS: Age, Time since onset of injury/illness/exacerbation, and 3+ comorbidities: Hypertension, B12 deficiency,  type 2 diabetes, hyperlipidemia, CKD stage IIIa are also affecting patient's functional outcome.   REHAB POTENTIAL: Good  CLINICAL DECISION MAKING: Evolving/moderate complexity  EVALUATION COMPLEXITY: Moderate  PLAN:  PT FREQUENCY: 1-2x/week  PT DURATION: 12 weeks  PLANNED INTERVENTIONS: 97164- PT Re-evaluation, 97750- Physical Performance Testing, 97110-Therapeutic exercises, 97530- Therapeutic activity, [id number]- Neuromuscular re-education, 97535- Self Care, [id number]- Manual therapy, [id number]- Gait training, [id number]- Orthotic Initial, [phone]- Orthotic/Prosthetic subsequent, [phone]- Canalith repositioning, [phone]- Electrical stimulation (manual), Patient/Family education, Balance training, Stair training, Taping, Joint mobilization, Spinal mobilization, Vestibular training, Visual/preceptual remediation/compensation, DME instructions, Cryotherapy, Moist heat, and Biofeedback  PLAN FOR NEXT SESSION:  - FGA when appropriate and add LTG - floor transfers training - continue HIIT - continue gait training without AD for balance and righting (include multi plane, multidirectional)  - dynamic reaching and dynamic stepping balance    Note: Portions of this document were prepared using Dragon voice recognition software and although reviewed may contain unintentional dictation errors in syntax, grammar, or spelling.  Lonni KATHEE Gainer PT ,DPT Physical Therapist- [place]  [facility]    2:13 PM, 03/26/24

## 2024-03-26 NOTE — Therapy (Signed)
 Occupational Therapy Treatment Note   Patient Name: Haley Jones MRN: [id number] DOB:12-30-1955, 68 y.o., female Today's Date: 03/26/2024  PCP: Cleotilde Oneil FALCON, MD REFERRING PROVIDER: Pegge Toribio PARAS, PA-C   OT End of Session - 03/26/24 1447     Visit Number 18    Number of Visits 24    Date for OT Re-Evaluation 04/22/24    OT Start Time 1445    OT Stop Time 1530    OT Time Calculation (min) 45 min    Activity Tolerance Patient tolerated treatment well    Behavior During Therapy [facility] for tasks assessed/performed                 Past Medical History:  Diagnosis Date   Adnexal mass    Anxiety    Chronic back pain    Depression    Diabetes mellitus without complication (HCC)    Endometriosis 03/12/2018   GERD (gastroesophageal reflux disease)    Heart murmur 02/2018   undetected until she was an adult. 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   Weight loss 12/06/2017   Past Surgical History:  Procedure Laterality Date   ABDOMINAL HYSTERECTOMY  2008   ovaries also   CESAREAN SECTION  1994   COLONOSCOPY     COLONOSCOPY WITH PROPOFOL  N/A 02/12/2018   Procedure: COLONOSCOPY WITH PROPOFOL ;  Surgeon: Toledo, Ladell POUR, MD;  Location: [facility] ENDOSCOPY;  Service: Gastroenterology;  Laterality: N/A;   ESOPHAGOGASTRODUODENOSCOPY (EGD) WITH PROPOFOL  N/A 02/12/2018   Procedure: ESOPHAGOGASTRODUODENOSCOPY (EGD) WITH PROPOFOL ;  Surgeon: Toledo, Ladell POUR, MD;  Location: [facility] ENDOSCOPY;  Service: Gastroenterology;  Laterality: N/A;   EYE SURGERY Left 1973   muscle shortening to straighten cross eyes   LAPAROSCOPY N/A 02/20/2018   Procedure: LAPAROSCOPY DIAGNOSTIC, ( RESECTION OF RIGHT LOWER QUADRANT ABDOMINAL MASS);  Surgeon: Nicholaus Selinda Birmingham, MD;  Location: [facility] ORS;  Service: General;  Laterality: N/A;   ROTATOR CUFF REPAIR Left 2011   Patient Active Problem List   Diagnosis Date  Noted   Depression with anxiety 01/04/2024   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   Acute CVA (cerebrovascular accident) (HCC) 12/20/2023   Primary hypertension 12/20/2023   Acute kidney injury superimposed on chronic kidney disease (HCC) 12/20/2023   DM type 2 with diabetic mixed hyperlipidemia (HCC) 11/17/2022   Major depressive disorder, recurrent, 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  Rationale for Evaluation and Treatment: Rehabilitation  SUBJECTIVE:   SUBJECTIVE STATEMENT: Pt. Reports that she doesn't understand why we strengthen her R hand when she is L handed.  Pt accompanied by: self  PERTINENT HISTORY: Pt. Is a 68 yo female with hx of a L MCA CVA. Pt. Was admitted into ED on 12/20/2023 with an onset of RUE weakness, numbness dysmetria, and visual deficits. Upon assessment, it was concluded that Pt. Experienced multiple infarcts in the L Parietal/Occipital region. PMHx: Dizziness, Falls  PRECAUTIONS: None  WEIGHT BEARING RESTRICTIONS: No  PAIN:  Are you having pain?  no  03/12/2024: 6/10 pain at the R dorsal forearm during gross grip strengthening task. 03/10/2024: No pain today. 03/05/2024: No pain, 2-3/10 level of discomfort during use of green resisitve clips.    FALLS: Has patient fallen in last 6 months? Yes. Number of falls 10  LIVING ENVIRONMENT: Lives with: lives with their family and lives alone Lives in: House/apartment Stairs: No, Ramp Has following equipment at home: Walker - 2 wheeled, Shower bench, and bed side commode  PLOF:  Independent  PATIENT GOALS: To be able to feed herself and hold a fork and knife better.   OBJECTIVE:  Note: Objective measures were completed at Evaluation unless otherwise noted.  HAND DOMINANCE: Left  ADLs: Overall ADLs:  Transfers/ambulation related to ADLs:  Eating: Independent  Grooming: independent UB Dressing: Pt. Has difficulty managing buttons LB Dressing: jean zippers are difficult to manipulate, hiking pants, and clothing negotiation are difficult. Toileting: Independent Bathing: Independent Tub Shower transfers: stepping into shower is difficult.  Equipment: Grab bars, shower bench, bedside commode  IADLs: Shopping: Does not currently do that/has not tried Light housekeeping: Does not currently do that/has not tried Meal Prep: Has not cooked in months, did not prior to CVA, sister provides meals. Community mobility: Relies on family, and friends Medication management: Assistance from sister to initiate medication management, she gives herself a shot for diabetes Financial management: Sister is assisting with monthly bill management Handwriting: Cursive form is 10% legible; Printed form is 90% legible  MOBILITY STATUS: Needs Assist: CGA and Hx of falls  POSTURE COMMENTS:  No Significant postural limitations Sitting balance: Good  ACTIVITY TOLERANCE: Activity tolerance: Good  FUNCTIONAL OUTCOME MEASURES: MAM-20: TBD  UPPER EXTREMITY ROM:    Active ROM Right eval Right 03/03/2024 Left Eval [facility]  Shoulder flexion 50(58) 61(74)   Shoulder abduction 43(48) 45(58)   Shoulder adduction     Shoulder extension     Shoulder internal rotation     Shoulder external rotation     Elbow flexion 118(131) 125(128)   Elbow extension -24(-22) -5(0)   Wrist flexion 20 (21) 42(48)   Wrist extension 30(31) 30(34)   Wrist ulnar deviation     Wrist radial deviation     Wrist pronation     Wrist supination     (Blank rows = not tested)  UPPER EXTREMITY MMT:     MMT  Right eval Right 03/03/2024 Left eval  Shoulder flexion 2/5 2/5 WFL  Shoulder abduction 2/5 2/5 [facility]  Shoulder adduction     Shoulder extension     Shoulder internal rotation     Shoulder external rotation     Middle trapezius     Lower trapezius     Elbow flexion 4-/5 4/5 WFL  Elbow extension 4-/5 4/5 WFL  Wrist flexion 2+/5 4-/5 WFL  Wrist extension 3-/5 4-/5 WFL  Wrist ulnar deviation     Wrist radial deviation     Wrist pronation     Wrist supination     (Blank rows = not tested)  HAND FUNCTION: Grip strength: Right: 21 lbs; Left: 14 lbs, Lateral pinch: Right: 7 lbs, Left: 4 lbs, and 3 point pinch: Right: 4 lbs, Left: 5 lbs  03/03/2024:  Grip strength: Right: 23#, Left: 28#, Lateral pinch: Right: 6#, Left: 5#, 3 point pinch: Right: 6#, Left: 3#  COORDINATION: 9 Hole Peg test: Right: 72 sec; Left: 40 sec  03/03/2024: 9 hole peg test: Right: 1 min 3 sec, Left: 36 sec  SENSATION: Light touch: Impaired  Proprioception: Impaired   EDEMA: none  MUSCLE TONE: RUE: Mild  COGNITION: Overall cognitive status: Impaired  VISION: Subjective report: Pt. Has not noticed the changes in vision but her sister has. Caregiver reports that Pt. Does not bring much attention to the R side.  Baseline vision:  Visual history:   VISION ASSESSMENT: TBD   PERCEPTION: TBD  PRAXIS: Impaired: Motor planning                                                                                                                           TREATMENT DATE: 03/26/2024      Therapeutic Ex.:  -Pt performed grip strengthening exercises using green theraputty for 1 set x 10 rep each: gross gripping, isolated thumb flexion, lateral pinch, and digit abdutcion -Green theraputty issued for home (had yellow) with handout provided.    Therapeutic Activity:  - Pt worked on Copy, and [employer] skills manipulating a [place] 1/2  thick control magnet with vision occluded, while guiding a 1/4 magnet with it  through mazes of progressively increasing degrees of complexity. Noted difficulty identifying orientation of magnet in hand with vision occluded - suspect sensation deficits contributing.  -Assessed stereognosis using tactile object recognition test with deficits noted. Pt correctly identifies 7/13 objects and reports uncertainty about her correct identifications. Pt states a marble is a bolt, then on a second trial states it is a nut. States a comb is a key or a safety pin.        PATIENT EDUCATION: Education details: BUE functioning, [facility] Person educated: Patient and Sister Education method: Explanation, Demonstration, Tactile cues, and Verbal cues Education comprehension: verbalized understanding and returned demonstration  HOME EXERCISE PROGRAM: Yellow theraputty HEP   GOALS: Goals reviewed with patient? Yes  SHORT TERM GOALS: Target date: 03/10/2024    Pt. Will be independent utilizing HEPs for hand strengthening exercises.  Baseline: Eval: No current HEP program Goal status: INITIAL   LONG TERM GOALS: Target date: 04/21/2024   Pt. Will improve BUE grip strength by 5# of force to be able to securely hold items. Baseline:  03/03/2024: R grip strength: 23#, L grip strength: 28#. Pt. has difficulty securely holding items in her hand. Eval: R grip strength: 21#, L grip strength: 14# Goal status: progressing, ongoing  2.  Pt. Will improve RUE [facility] skills by 3 sec. to be able to manipulate zippers/buttons. Baseline: 03/03/2024: 9 hole peg test: R side: 1 min 3 sec, L side: 36 sec. 9 hole peg test; R side: 72 secs, L side: 40 secs Goal status: progressing, ongoing  3.  Pt. Will increase R shoulder flex/shoulder ABD ROM by 10 degrees to perform ADLs/IADLs.  Baseline: 03/03/2024: R shoulder flex: 61(74), R shoulder ABD: 45(58) R shoulder flex: 50(58), R shoulder ABD: 43(48) Goal status: progressing, ongoing  4.  Pt. Will improve handwriting to 50% legible in cursive form, legibility to  be able to send written correspondence Baseline: 03/03/2024: 50% legible in cursive  form, Eval: 10% legible in cursive form, 90% legible in printed form. Goal status: Discontinue goal per Pt. report that it has returned to baseline.  5.  Pt. Will improve BUE pinch strength by 2# of force to  be able to assist with hiking pants Baseline: 03/03/2024: Lateral Pinch: R: 6#, L: 5#, 3 pt pinch: R: 6#, L: 3#.  Eval: Lateral Pinch: 7, R 3 pt. Pinch: 4, L Lateral Pinch: 4, L 3 pt. Pinch: 5 Goal status: partially met  6.  Pt. Will increase R wrist extension/flexion by 5 degrees in anticipation of reaching hold of items for ADLs. Baseline: 03/03/2024: R wrist ext: 30 (34), R Wrist flex: 42(48) Eval: R Wrist ext: 20(21), R Wrist flex: 30(31) Goal status: Continue  ASSESSMENT:  CLINICAL IMPRESSION: Pt completed tactile object recognition assessment with stereognosis deficits noted. Across separate attempts pt identifies a comb in her hand as a safety pin and a marble as a nut or a bolt. Educated on condition management and OT POC as pt is making good progress towards completing goals. Issued green theraputty for home exercises with good tolerance of exercises, good recall from prior session. Pt. continues to benefit from OT services to work on RUE ROM, RUE strengthening, bilateral grip strength, bilateral pinch strength, bilateral [facility] skills, and Right sided awareness and attention in order to improve functional independence of ADL/IADL tasks at home.   PERFORMANCE DEFICITS: in functional skills including ADLs, IADLs, coordination, dexterity, proprioception, sensation, tone, ROM, strength, pain, Fine motor control, Gross motor control, endurance, and UE functional use, and psychosocial skills including environmental adaptation, habits, interpersonal interactions, and routines and behaviors.   IMPAIRMENTS: are limiting patient from ADLs, IADLs, rest and sleep, work, leisure, and social participation.    CO-MORBIDITIES: may have co-morbidities  that affects occupational performance. Patient will benefit from skilled OT to address above impairments and improve overall function.  MODIFICATION OR ASSISTANCE TO COMPLETE EVALUATION: Min-Moderate modification of tasks or assist with assess necessary to complete an evaluation.  OT OCCUPATIONAL PROFILE AND HISTORY: Detailed assessment: Review of records and additional review of physical, cognitive, psychosocial history related to current functional performance.  CLINICAL DECISION MAKING: Moderate - several treatment options, min-mod task modification necessary  REHAB POTENTIAL: Good  EVALUATION COMPLEXITY: Moderate    PLAN:  OT FREQUENCY: 2x/week  OT DURATION: 12 weeks  PLANNED INTERVENTIONS: 97535 self care/ADL training, 02889 therapeutic exercise, 97530 therapeutic activity, 97112 neuromuscular re-education, 97018 paraffin, 02989 moist heat, 97010 cryotherapy, 97034 contrast bath, 97032 electrical stimulation (manual), passive range of motion, visual/perceptual remediation/compensation, energy conservation, patient/family education, and DME and/or AE instructions  RECOMMENDED OTHER SERVICES: ST & PT  CONSULTED AND AGREED WITH PLAN OF CARE: Patient and family member/caregiver  PLAN FOR NEXT SESSION: treatment   Elston Slot, M.S. OTR/L  03/26/24, 2:47 PM  ascom [phone]   03/26/2024, 2:47 PM

## 2024-03-31 ENCOUNTER — Ambulatory Visit: Admitting: Physical Therapy

## 2024-03-31 ENCOUNTER — Ambulatory Visit: Admitting: Occupational Therapy

## 2024-03-31 ENCOUNTER — Ambulatory Visit: Admitting: Speech Pathology

## 2024-03-31 DIAGNOSIS — M6281 Muscle weakness (generalized): Secondary | ICD-10-CM

## 2024-03-31 DIAGNOSIS — R41841 Cognitive communication deficit: Secondary | ICD-10-CM | POA: Diagnosis not present

## 2024-03-31 DIAGNOSIS — R2681 Unsteadiness on feet: Secondary | ICD-10-CM

## 2024-03-31 DIAGNOSIS — R278 Other lack of coordination: Secondary | ICD-10-CM

## 2024-03-31 DIAGNOSIS — R269 Unspecified abnormalities of gait and mobility: Secondary | ICD-10-CM

## 2024-03-31 NOTE — Therapy (Signed)
 OUTPATIENT PHYSICAL THERAPY TREATMENT   Patient Name: Haley Jones MRN: [id number] DOB:1956-01-26, 68 y.o., female Today's Date: 03/31/2024  PCP: Dr. Oneil Pinal REFERRING PROVIDER: Toribio Pitch, PA-C  END OF SESSION:   PT End of Session - 03/31/24 1354     Visit Number 19    Number of Visits 24    Date for PT Re-Evaluation 04/21/24    Progress Note Due on Visit 20    PT Start Time 1400    PT Stop Time 1443    PT Time Calculation (min) 43 min    Equipment Utilized During Treatment Gait belt    Activity Tolerance Patient tolerated treatment well    Behavior During Therapy WFL for tasks assessed/performed                Past Medical History:  Diagnosis Date   Adnexal mass    Anxiety    Chronic back pain    Depression    Diabetes mellitus without complication (HCC)    Endometriosis 03/12/2018   GERD (gastroesophageal reflux disease)    Heart murmur 02/2018   undetected until she was an adult. 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   Weight loss 12/06/2017   Past Surgical History:  Procedure Laterality Date   ABDOMINAL HYSTERECTOMY  2008   ovaries also   CESAREAN SECTION  1994   COLONOSCOPY     COLONOSCOPY WITH PROPOFOL  N/A 02/12/2018   Procedure: COLONOSCOPY WITH PROPOFOL ;  Surgeon: Toledo, Ladell POUR, MD;  Location: [facility];  Service: Gastroenterology;  Laterality: N/A;   ESOPHAGOGASTRODUODENOSCOPY (EGD) WITH PROPOFOL  N/A 02/12/2018   Procedure: ESOPHAGOGASTRODUODENOSCOPY (EGD) WITH PROPOFOL ;  Surgeon: Toledo, Ladell POUR, MD;  Location: [facility];  Service: Gastroenterology;  Laterality: N/A;   EYE SURGERY Left 1973   muscle shortening to straighten cross eyes   LAPAROSCOPY N/A 02/20/2018   Procedure: LAPAROSCOPY DIAGNOSTIC, ( RESECTION OF RIGHT LOWER QUADRANT ABDOMINAL MASS);  Surgeon: Nicholaus Selinda Birmingham, MD;  Location: [facility];  Service: General;   Laterality: N/A;   ROTATOR CUFF REPAIR Left 2011   Patient Active Problem List   Diagnosis Date Noted   Urinary incontinence as sequela of cerebrovascular accident (CVA) 02/11/2024   Depression with anxiety 01/04/2024   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   Acute CVA (cerebrovascular accident) (HCC) 12/20/2023   Primary hypertension 12/20/2023   Acute kidney injury superimposed on chronic kidney disease (HCC) 12/20/2023   DM type 2 with diabetic mixed hyperlipidemia (HCC) 11/17/2022   Major depressive disorder, recurrent, 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  Rationale for Evaluation and Treatment: Rehabilitation  SUBJECTIVE:  SUBJECTIVE STATEMENT:   Denies any falls since last session.  Still has been walking exclusively with her walker when at home due to fear of falling.  Patient reports she is feeling well today.  PERTINENT HISTORY:Pt states she is not sure the exact date of when her CVA happened because she kept falling at home trying to care for herself and her daughter, but she finally went to the hospital when her sister insisted on it. Pt states her special needs daughter, [name], passed away recently while pt was in the hospital at [facility] before she was transferred to [facility] for stroke rehabilitation.Pt's sister states pt requires  supervision/cuing for ADLs  to don clothes correctly, with correct orientation (potential apraxia?) Pt/sister report pt has R side inattention.Pt reports supposedly one of her legs is shorter than the other, believes her R LE might be the shorter one. 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     PAIN:  Are you having pain? No  PRECAUTIONS: Fall RED FLAGS: None  WEIGHT BEARING RESTRICTIONS: No FALLS: Has patient fallen in last 6 months? Yes. Number of falls 6+ before going to hospital and 1 fall since being home from CIR - pt states she was trying to gather her clothes by herself rather than waiting for her sister to get there to help her  LIVING ENVIRONMENT Lives with: lives alone and but pt's sister, [name], comes over daily to assist with ADLs and stays to provide supervision all day - pt states at night she would be able to walk to bathroom using RW if needed (but she wears depends in event of incontinence) Lives in: House/apartment Stairs: she has steps, but she doesn't have to use them (house was wheelchair accessible for her daughter)  Has following equipment at home: Environmental consultant - 2 wheeled, shower chair, Grab bars, Ramped entry, and transport chair  PLOF: Independent, Independent with household mobility without device, Independent with homemaking with ambulation, Independent with gait, Independent with transfers, and she was the primary caregiver for her recently deceased daughter   CLOF: Sister provides supervision daily for pt to ambulate using RW safely and provides assist for ADLs (showering and dressing - progressing towards supervision with these activities); Sister states pt requires cues to turn and step back fully prior to sitting to ensure her safety  PATIENT GOALS: get back to being able to go shopping with improved community level ambulation without the walker; return to driving   OBJECTIVE:  Note: Objective measures were  completed at Evaluation unless otherwise noted.  DIAGNOSTIC FINDINGS:   EXAM: MRI HEAD WITHOUT CONTRAST   TECHNIQUE: Multiplanar, multiecho pulse sequences of the brain and surrounding structures were obtained without intravenous contrast.   COMPARISON:  Brain MRI 07/31/2022.  IMPRESSION: 1. Patchy acute cortical/subcortical infarcts within the left parietal and occipital lobes individually measuring up to 3 cm (MCA vascular territory). Subtle petechial hemorrhage associated with some of these infarcts. No frank hemorrhagic conversion. 2. Additional punctate left MCA territory cortical acute infarct within the left insula. 3. Background parenchymal atrophy and chronic small vessel ischemic disease with chronic lacunar infarcts, as described. 4. Paranasal sinus disease as outlined (including severe right maxillary sinusitis).   Electronically Signed   By: Rockey Childs D.O.   On: 12/20/2023 11:52  COGNITION: Overall cognitive status: Within functional limits for tasks assessed   SENSATION: Light touch: initially misses the first 1-2 touches on R LE, but then  able to feel remaining touches Proprioception: Impaired grossly  COORDINATION: Symmetrical heel-to-shin bilaterally  EDEMA:  Not formally assessed, but none observed  MUSCLE TONE: Not formally assessed  POSTURE: rounded shoulders, forward head, and posterior pelvic tilt  LOWER EXTREMITY MMT:    MMT Right Eval Left Eval  Hip flexion 3+, no pain 3+ with some L hip and back pain with this  Hip extension    Hip abduction    Hip adduction    Hip internal rotation    Hip external rotation    Knee flexion 3+ 4  Knee extension 4- 4  Ankle dorsiflexion 3+ 4  Ankle plantarflexion 4- 4  Ankle inversion    Ankle eversion    (Blank rows = not tested)  BED MOBILITY:  Findings: Sit to supine need to assess Supine to sit need to assess *pt reports some difficulty with bed mobility  TRANSFERS: Sit to stand: SBA   Assistive device utilized: Environmental consultant - 2 wheeled     Stand to sit: SBA  Assistive device utilized: Environmental consultant - 2 wheeled     Chair to chair: SBA  Assistive device utilized: Environmental consultant - 2 wheeled       GAIT: Findings: Gait Characteristics: slight R ankle instability noted, step through pattern, decreased stance time- Right, decreased stride length, and decreased ankle dorsiflexion- Right, Distance walked: ~136ft, Assistive device utilized:Walker - 2 wheeled, Level of assistance: SBA and CGA, and Comments:    FUNCTIONAL TESTS:  5 times sit to stand: 38.75 seconds, using UE support 10 meter walk test: 0.48 m/s using youth RW, requires CGA for safety 6 minute walk test: Visit 2, see note Berg Balance Scale: Visit 2, see note Functional gait assessment: need to assess, when appropriate  PATIENT SURVEYS:  ABC scale 7.5%                                                                                                                             TREATMENT DATE: 03/31/2024  Unless otherwise stated, CGA was provided and gait belt donned in order to ensure pt safety   TA- To improve functional movements patterns for everyday tasks    Gait x 470 ft with 2.5# AW and RW - cues for faster than comfortable pace  STS x 12 no UE on airex pad Gait x 470 ft with 2.5#  with RW and cues for faster than comfortable pace  STS x 12 no UE with 3# AW on airex pad Gait x 470 ft with RW with 2.5# and cues for faster than comfortable pace standing  STS x 12 no UE with 3# AW on airex pad- cues for prolonged stand to gain balance and get COM over BOS  Gait without AD to OT treatment area to end session  NMR: To facilitate reeducation of movement, balance, posture, coordination, and/or proprioception/kinesthetic sense.  Activity Description: Blaze pods set up in a track around clinic to be on pts right side 3  x 4 min  rounds. Pt tasked with remembering rounte for spatial awareness and unassisted gait with close CGA. Good  balance overall with this with only 1 small LOB corrected with minA from PT.   Unless otherwise stated, CGA was provided and gait belt donned in order to ensure pt safety   PATIENT EDUCATION: Education details: PT POC, findings on assessment today, recommendation to continue using RW for all functional mobility Person educated: Patient and Caregiver [name] Education method: Explanation Education comprehension: verbalized understanding and needs further education  HOME EXERCISE PROGRAM: Access Code: [email or web address] URL: [email or web address] Date: 02/04/2024 Prepared by: Peggye Linear  Exercises - Standing March with Counter Support  - 2 x daily - 7 x weekly - 2 sets - 20 reps - Side stepping with counter support: yellow band loop  - 2 x daily - 7 x weekly - 2 sets - 20 reps - Sit to Stand with Counter Support  - 2 x daily - 7 x weekly - 2 sets - 12 reps   GOALS: Goals reviewed with patient? Yes  SHORT TERM GOALS: Target date: 03/10/2024  Pt will be independent with HEP in order to improve strength and balance in order to decrease fall risk and improve function at home and work.  Baseline: need to initiate Goal status:  ONGOING: 7/16: 2-3x week  LONG TERM GOALS: Target date: 04/21/2024  1.  Patient will complete five times sit to stand test in < 15 seconds indicating an increased LE strength and improved balance. Baseline:  38.75 seconds, using UE support, 7/16: 20.35 sec, UE support on 1st STS only Goal Status: ONGOING   2.  Patient will increase ABC scale score >80% to demonstrate better functional mobility and better confidence with ADLs.   Baseline: 7.5%, 7/16: 18.125% Goal status: INITIAL   3.  Patient will increase Berg Balance score by > 6 points to demonstrate decreased fall risk during functional activities. Baseline: need to assess; 01/30/2024= 34/56, 7/16= 39 Goal status: INITIAL   4. Patient will increase 10 meter walk test to >1.66m/s as to improve gait speed  for better community ambulation and to reduce fall risk. Baseline: 0.48 m/s using youth RW, requires CGA for safety, 7/16: 0.69 m/s  Goal status: INITIAL  5. Patient will increase six minute walk test distance to >1000 for progression to community ambulator and improve gait ability Baseline: need to assess; 01/30/2024=410 feet using RW; 02/13/24: 444ft in 86m30s, 7/16: 632ft using 2WW Goal status: ONGOING   ASSESSMENT:  CLINICAL IMPRESSION:    Patient presents with good motivation for completion of physical therapy activities. Patient does show improved stability this date showing no overt loss of balance with unassisted gait despite incorporated R side awareness and dual task component of blaze pods. Close contact-guard assist provided throughout.   Will continue to incorporate interventions to improve attention to her right side particularly with gait and ambulation as we progress. Pt will continue to benefit from skilled physical therapy intervention to address impairments, improve QOL, and attain therapy goals.   OBJECTIVE IMPAIRMENTS: Abnormal gait, decreased activity tolerance, decreased balance, decreased endurance, decreased knowledge of use of DME, decreased mobility, difficulty walking, decreased strength, decreased safety awareness, impaired vision/preception, and pain.   ACTIVITY LIMITATIONS: carrying, lifting, bending, standing, squatting, stairs, transfers, bed mobility, bathing, toileting, dressing, reach over head, hygiene/grooming, and locomotion level  PARTICIPATION LIMITATIONS: meal prep, cleaning, laundry, driving, shopping, and community activity  PERSONAL FACTORS: Age, Time since onset of injury/illness/exacerbation, and 3+ comorbidities: Hypertension,  B12 deficiency, type 2 diabetes, hyperlipidemia, CKD stage IIIa are also affecting patient's functional outcome.   REHAB POTENTIAL: Good  CLINICAL DECISION MAKING: Evolving/moderate complexity  EVALUATION COMPLEXITY:  Moderate  PLAN:  PT FREQUENCY: 1-2x/week  PT DURATION: 12 weeks  PLANNED INTERVENTIONS: 97164- PT Re-evaluation, 97750- Physical Performance Testing, 97110-Therapeutic exercises, 97530- Therapeutic activity, [id number]- Neuromuscular re-education, 97535- Self Care, [id number]- Manual therapy, [id number]- Gait training, [phone]- Orthotic Initial, [phone]- Orthotic/Prosthetic subsequent, [phone]- Canalith repositioning, [phone]- Electrical stimulation (manual), Patient/Family education, Balance training, Stair training, Taping, Joint mobilization, Spinal mobilization, Vestibular training, Visual/preceptual remediation/compensation, DME instructions, Cryotherapy, Moist heat, and Biofeedback  PLAN FOR NEXT SESSION:   - floor transfers training - continue HIIT - continue gait training without AD for balance and righting (include multi plane, multidirectional)  - dynamic reaching and dynamic stepping balance    Note: Portions of this document were prepared using Dragon voice recognition software and although reviewed may contain unintentional dictation errors in syntax, grammar, or spelling.  Lonni KATHEE Gainer PT ,DPT Physical Therapist- [facility]    1:55 PM, 03/31/24

## 2024-03-31 NOTE — Therapy (Signed)
 Occupational Therapy Treatment Note   Patient Name: Haley Jones MRN: [id number] DOB:August 22, 1955, 68 y.o., female Today's Date: 03/31/2024  PCP: Cleotilde Oneil FALCON, MD REFERRING PROVIDER: Pegge Toribio PARAS, PA-C   OT End of Session - 03/31/24 2216     Visit Number 19    Number of Visits 24    Date for OT Re-Evaluation 04/22/24    OT Start Time 1445    OT Stop Time 1530    OT Time Calculation (min) 45 min    Activity Tolerance Patient tolerated treatment well    Behavior During Therapy [facility] for tasks assessed/performed                 Past Medical History:  Diagnosis Date   Adnexal mass    Anxiety    Chronic back pain    Depression    Diabetes mellitus without complication (HCC)    Endometriosis 03/12/2018   GERD (gastroesophageal reflux disease)    Heart murmur 02/2018   undetected until she was an adult. 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   Weight loss 12/06/2017   Past Surgical History:  Procedure Laterality Date   ABDOMINAL HYSTERECTOMY  2008   ovaries also   CESAREAN SECTION  1994   COLONOSCOPY     COLONOSCOPY WITH PROPOFOL  N/A 02/12/2018   Procedure: COLONOSCOPY WITH PROPOFOL ;  Surgeon: Toledo, Ladell POUR, MD;  Location: [facility];  Service: Gastroenterology;  Laterality: N/A;   ESOPHAGOGASTRODUODENOSCOPY (EGD) WITH PROPOFOL  N/A 02/12/2018   Procedure: ESOPHAGOGASTRODUODENOSCOPY (EGD) WITH PROPOFOL ;  Surgeon: Toledo, Ladell POUR, MD;  Location: [facility];  Service: Gastroenterology;  Laterality: N/A;   EYE SURGERY Left 1973   muscle shortening to straighten cross eyes   LAPAROSCOPY N/A 02/20/2018   Procedure: LAPAROSCOPY DIAGNOSTIC, ( RESECTION OF RIGHT LOWER QUADRANT ABDOMINAL MASS);  Surgeon: Nicholaus Selinda Birmingham, MD;  Location: [facility] ORS;  Service: General;  Laterality: N/A;   ROTATOR CUFF REPAIR Left 2011   Patient Active Problem List   Diagnosis Date  Noted   Depression with anxiety 01/04/2024   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   Acute CVA (cerebrovascular accident) (HCC) 12/20/2023   Primary hypertension 12/20/2023   Acute kidney injury superimposed on chronic kidney disease (HCC) 12/20/2023   DM type 2 with diabetic mixed hyperlipidemia (HCC) 11/17/2022   Major depressive disorder, recurrent, 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  Rationale for Evaluation and Treatment: Rehabilitation  SUBJECTIVE:   SUBJECTIVE STATEMENT:  Pt. Is looking forward to taking measurements next visit Pt accompanied by: self  PERTINENT HISTORY: Pt. Is a 68 yo female with hx of a L MCA CVA. Pt. Was admitted into ED on 12/20/2023 with an onset of RUE weakness, numbness dysmetria, and visual deficits. Upon assessment, it was concluded that Pt. Experienced multiple infarcts in the L Parietal/Occipital region. PMHx: Dizziness, Falls  PRECAUTIONS: None  WEIGHT BEARING RESTRICTIONS: No  PAIN:  Are you having pain?  no   03/12/2024: 6/10 pain at the R dorsal  forearm during gross grip strengthening task. 03/10/2024: No pain today. 03/05/2024: No pain, 2-3/10 level of discomfort during use of green resisitve clips.    FALLS: Has patient fallen in last 6 months? Yes. Number of falls 10  LIVING ENVIRONMENT: Lives with: lives with their family and lives alone Lives in: House/apartment Stairs: No, Ramp Has following equipment at home: Walker - 2 wheeled, Shower bench, and bed side commode  PLOF: Independent  PATIENT GOALS: To be able to feed  herself and hold a fork and knife better.   OBJECTIVE:  Note: Objective measures were completed at Evaluation unless otherwise noted.  HAND DOMINANCE: Left  ADLs: Overall ADLs:  Transfers/ambulation related to ADLs:  Eating: Independent  Grooming: independent UB Dressing: Pt. Has difficulty managing buttons LB Dressing: jean zippers are difficult to manipulate, hiking pants, and clothing negotiation are difficult. Toileting: Independent Bathing: Independent Tub Shower transfers: stepping into shower is difficult.  Equipment: Grab bars, shower bench, bedside commode  IADLs: Shopping: Does not currently do that/has not tried Light housekeeping: Does not currently do that/has not tried Meal Prep: Has not cooked in months, did not prior to CVA, sister provides meals. Community mobility: Relies on family, and friends Medication management: Assistance from sister to initiate medication management, she gives herself a shot for diabetes Financial management: Sister is assisting with monthly bill management Handwriting: Cursive form is 10% legible; Printed form is 90% legible  MOBILITY STATUS: Needs Assist: CGA and Hx of falls  POSTURE COMMENTS:  No Significant postural limitations Sitting balance: Good  ACTIVITY TOLERANCE: Activity tolerance: Good  FUNCTIONAL OUTCOME MEASURES: MAM-20: TBD  UPPER EXTREMITY ROM:    Active ROM Right eval Right 03/03/2024 Left Eval [facility]  Shoulder flexion 50(58) 61(74)   Shoulder abduction 43(48) 45(58)   Shoulder adduction     Shoulder extension     Shoulder internal rotation     Shoulder external rotation     Elbow flexion 118(131) 125(128)   Elbow extension -24(-22) -5(0)   Wrist flexion 20 (21) 42(48)   Wrist extension 30(31) 30(34)   Wrist ulnar deviation     Wrist radial deviation     Wrist pronation     Wrist supination     (Blank rows = not tested)  UPPER EXTREMITY MMT:     MMT Right eval Right 03/03/2024 Left eval   Shoulder flexion 2/5 2/5 WFL  Shoulder abduction 2/5 2/5 [facility]  Shoulder adduction     Shoulder extension     Shoulder internal rotation     Shoulder external rotation     Middle trapezius     Lower trapezius     Elbow flexion 4-/5 4/5 WFL  Elbow extension 4-/5 4/5 WFL  Wrist flexion 2+/5 4-/5 WFL  Wrist extension 3-/5 4-/5 WFL  Wrist ulnar deviation     Wrist radial deviation     Wrist pronation     Wrist supination     (Blank rows = not tested)  HAND FUNCTION: Grip strength: Right: 21 lbs; Left: 14 lbs, Lateral pinch: Right: 7 lbs, Left: 4 lbs, and 3 point pinch: Right: 4 lbs, Left: 5 lbs  03/03/2024:  Grip strength: Right: 23#, Left: 28#, Lateral pinch: Right: 6#, Left: 5#, 3 point pinch: Right: 6#, Left: 3#  COORDINATION: 9 Hole Peg test: Right: 72 sec; Left: 40 sec  03/03/2024: 9 hole peg test: Right: 1 min 3 sec, Left: 36 sec  SENSATION: Light touch: Impaired  Proprioception: Impaired   EDEMA: none  MUSCLE TONE: RUE: Mild  COGNITION: Overall cognitive status: Impaired  VISION: Subjective report: Pt. Has not noticed the changes in vision but her sister has. Caregiver reports that Pt. Does not bring much attention to the R side.  Baseline vision:  Visual history:   VISION ASSESSMENT: TBD   PERCEPTION: TBD  PRAXIS: Impaired: Motor planning                                                                                                                           TREATMENT DATE: 03/31/2024      Therapeutic Ex.BETHA LONE was performed for the right shoulder through flexion, abduction, and horizontal abduction. -right elbow flexion and extension strengthening with yellow level resistive theraband  for 1 set 10 reps -right  wrist extension, and radial deviation strengthening with a 1# hand weight one set 10 reps with cues for proper form, and technique. -right hand digit flexion with 1.5# DigiFlex with cues, and assist required for proper hand  alignment.  Neuromuscular re-education:  -facilitated Right  hand FMC tasks using the Grooved pegboard, grasping and 1 grooved pegs from a horizontal position in the shallow dish, and transitioning them to a vertical position in preparation for placing them upright into the pegboard.  -facilitated translatory movements of the hand storing the objects in her hand, then moving them from the palm to the tip of the 2nd digit and thumb in preparation for placing them into the slots vertically. -facilitated right hand [facility] skills discarding the items from the palm one at a time out the ulnar aspect of the hand.  PATIENT EDUCATION: Education details: BUE functioning, FMC Person educated: Patient and Sister Education method: Explanation, Demonstration, Tactile cues, and Verbal cues Education comprehension: verbalized understanding and returned demonstration  HOME EXERCISE PROGRAM: Yellow theraputty HEP   GOALS: Goals reviewed with patient? Yes  SHORT TERM GOALS: Target date: 03/10/2024    Pt. Will be independent utilizing HEPs for hand strengthening exercises.  Baseline: Eval: No current HEP program Goal status: INITIAL   LONG TERM GOALS: Target date: 04/21/2024   Pt. Will improve BUE grip strength by 5# of force to be able to securely hold items. Baseline:  03/03/2024: R grip strength: 23#, L grip strength: 28#. Pt. has difficulty securely holding items in her hand. Eval: R grip strength: 21#, L grip strength: 14# Goal status: progressing, ongoing  2.  Pt. Will improve RUE [facility] skills by 3 sec. to be able to manipulate zippers/buttons. Baseline: 03/03/2024: 9 hole peg test: R side: 1 min 3 sec, L side: 36 sec. 9 hole peg test; R side: 72 secs, L side: 40 secs Goal status: progressing, ongoing  3.  Pt. Will increase R shoulder flex/shoulder ABD ROM by 10 degrees to perform ADLs/IADLs.  Baseline: 03/03/2024: R shoulder flex: 61(74), R shoulder ABD: 45(58) R shoulder flex: 50(58), R shoulder  ABD: 43(48) Goal status: progressing, ongoing  4.  Pt. Will improve handwriting to 50%  legible in cursive form, legibility to be able to send written correspondence Baseline: 03/03/2024: 50% legible in cursive form, Eval: 10% legible in cursive form, 90% legible in printed form. Goal status: Discontinue goal per Pt. report that it has returned to baseline.  5.  Pt. Will improve BUE pinch strength by 2# of force to  be able to assist with hiking pants Baseline: 03/03/2024: Lateral Pinch: R: 6#, L: 5#, 3 pt pinch: R: 6#, L: 3#.  Eval: Lateral Pinch: 7, R 3 pt. Pinch: 4, L Lateral Pinch: 4, L 3 pt. Pinch: 5 Goal status: partially met  6.  Pt. Will increase R wrist extension/flexion by 5 degrees in anticipation of reaching hold of items for ADLs. Baseline: 03/03/2024: R wrist ext: 30 (34), R Wrist flex: 42(48) Eval: R Wrist ext: 20(21), R Wrist flex: 30(31) Goal status: Continue  ASSESSMENT:  CLINICAL IMPRESSION:  Pt. continues to present with limited right shoulder ROM, and requires assist to perform  each  of the ROM, and strengthening exercises. Pt. Requires cues, and assist for motor planning, technique, hand position, alignment, and form.  Pt. Presents with difficulty performing translatory movements of the hand. Plan to complete the progress report update next visit. Pt. continues to benefit from OT services to work on RUE ROM, RUE strengthening, bilateral grip strength, bilateral pinch strength, bilateral [facility] skills, and Right sided awareness and attention in order to improve functional independence of ADL/IADL tasks at home.   PERFORMANCE DEFICITS: in functional skills including ADLs, IADLs, coordination, dexterity, proprioception, sensation, tone, ROM, strength, pain, Fine motor control, Gross motor control, endurance, and UE functional use, and psychosocial skills including environmental adaptation, habits, interpersonal interactions, and routines and behaviors.   IMPAIRMENTS: are limiting  patient from ADLs, IADLs, rest and sleep, work, leisure, and social participation.   CO-MORBIDITIES: may have co-morbidities  that affects occupational performance. Patient will benefit from skilled OT to address above impairments and improve overall function.  MODIFICATION OR ASSISTANCE TO COMPLETE EVALUATION: Min-Moderate modification of tasks or assist with assess necessary to complete an evaluation.  OT OCCUPATIONAL PROFILE AND HISTORY: Detailed assessment: Review of records and additional review of physical, cognitive, psychosocial history related to current functional performance.  CLINICAL DECISION MAKING: Moderate - several treatment options, min-mod task modification necessary  REHAB POTENTIAL: Good  EVALUATION COMPLEXITY: Moderate    PLAN:  OT FREQUENCY: 2x/week  OT DURATION: 12 weeks  PLANNED INTERVENTIONS: 97535 self care/ADL training, 02889 therapeutic exercise, 97530 therapeutic activity, 97112 neuromuscular re-education, 97018 paraffin, 02989 moist heat, 97010 cryotherapy, 97034 contrast bath, 97032 electrical stimulation (manual), passive range of motion, visual/perceptual remediation/compensation, energy conservation, patient/family education, and DME and/or AE instructions  RECOMMENDED OTHER SERVICES: ST & PT  CONSULTED AND AGREED WITH PLAN OF CARE: Patient and family member/caregiver  PLAN FOR NEXT SESSION: treatment   Lary Eckardt, MS, OTR/L    03/31/2024, 10:19 PM

## 2024-03-31 NOTE — Therapy (Signed)
 OUTPATIENT SPEECH LANGUAGE PATHOLOGY  COGNITIVE-COMMUNICATION TREATMENT   Patient Name: Haley Jones MRN: [id number] DOB:01-15-1956, 68 y.o., female Today's Date: 03/31/2024  PCP: Cleotilde Oneil FALCON, MD  REFERRING PROVIDER: Pegge Toribio PARAS, PA-C    End of Session - 03/31/24 1321     Visit Number 17    Number of Visits 25    Date for SLP Re-Evaluation 04/27/24    Authorization Type [employer]    Authorization Time Period 01/28/2024 thru 04/27/2024    Authorization - Visit Number 17    Authorization - Number of Visits 24    Progress Note Due on Visit 20    SLP Start Time 1315    SLP Stop Time  1400    SLP Time Calculation (min) 45 min    Activity Tolerance Patient tolerated treatment well          Past Medical History:  Diagnosis Date   Adnexal mass    Anxiety    Chronic back pain    Depression    Diabetes mellitus without complication (HCC)    Endometriosis 03/12/2018   GERD (gastroesophageal reflux disease)    Heart murmur 02/2018   undetected until she was an adult. 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   Weight loss 12/06/2017   Past Surgical History:  Procedure Laterality Date   ABDOMINAL HYSTERECTOMY  2008   ovaries also   CESAREAN SECTION  1994   COLONOSCOPY     COLONOSCOPY WITH PROPOFOL  N/A 02/12/2018   Procedure: COLONOSCOPY WITH PROPOFOL ;  Surgeon: Toledo, Ladell POUR, MD;  Location: [facility];  Service: Gastroenterology;  Laterality: N/A;   ESOPHAGOGASTRODUODENOSCOPY (EGD) WITH PROPOFOL  N/A 02/12/2018   Procedure: ESOPHAGOGASTRODUODENOSCOPY (EGD) WITH PROPOFOL ;  Surgeon: Toledo, Ladell POUR, MD;  Location: [facility];  Service: Gastroenterology;  Laterality: N/A;   EYE SURGERY Left 1973   muscle shortening to straighten cross eyes   LAPAROSCOPY N/A 02/20/2018   Procedure: LAPAROSCOPY DIAGNOSTIC, ( RESECTION OF RIGHT LOWER QUADRANT ABDOMINAL MASS);   Surgeon: Nicholaus Selinda Birmingham, MD;  Location: [facility] ORS;  Service: General;  Laterality: N/A;   ROTATOR CUFF REPAIR Left 2011   Patient Active Problem List   Diagnosis Date Noted   Urinary incontinence as sequela of cerebrovascular accident (CVA) 02/11/2024   Depression with anxiety 01/04/2024   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   Acute CVA (cerebrovascular accident) (HCC) 12/20/2023   Primary hypertension 12/20/2023   Acute kidney injury superimposed on chronic kidney disease (HCC) 12/20/2023   DM type 2 with diabetic mixed hyperlipidemia (HCC) 11/17/2022   Major depressive disorder, recurrent, 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/17/2022   B12 deficiency 02/23/2021   SBO (small bowel obstruction) (HCC) 01/29/2019   Barrett's esophagus without dysplasia 04/15/2018   Weight loss 12/06/2017   Hoarse 12/06/2017   Lumbar disc disease 07/26/2017   Diabetes mellitus type 2, controlled, without complications (HCC) 02/09/2015   Surgical menopause 11/26/2014    ONSET DATE: 12/20/23   REFERRING DIAG: CVA  THERAPY DIAG:  Cognitive communication deficit  Rationale for Evaluation and Treatment Rehabilitation  SUBJECTIVE:   PERTINENT HISTORY: Haley Jones is a 68 year old left-handed female who presented to [facility] on [facility]/20/23 with right side weakness/falls and dizziness. MRI of the brain showed patchy acute cortical/subcortical infarcts within the left parietal occipital lobes individually measuring up to 3 cm (MCA vascular territory). Subtle petechial hemorrhage associated with some of these infarcts.  No frank hemorrhagic conversion.  Additional punctate left MCA territory cortical acute infarct within the left insula.  Background parenchymal atrophy and chronic small vessel ischemic disease with chronic lacunar infarcts.  CT angio showed severe stenosis/nonocclusive thrombus of the left MCA bifurcation affecting the inferior division.  Nonocclusive embolus also visible within the left parietal branch as  well. Echocardiogram ejection fraction of 60 to 65% no wall motion abnormalities grade 1 diastolic dysfunction. Past medical history also noted for hypertension, B12 deficiency, type 2 diabetes mellitus, hyperlipidemia, CKD stage III, depression.  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 D/c home after CIR admission 12/28/23-01/18/24. Sister lives nearby and checks on patient throughout the day, assists with meals, bathing, and medications.  DIAGNOSTIC FINDINGS: see above  PAIN:  Are you having pain? No   FALLS: Has patient fallen in last 6 months?  Yes, Number of falls: 1 since returning home from rehab, multiple prior to hospitalization  LIVING ENVIRONMENT: Lives with: lives alone Lives in: House/apartment  PLOF:  Level of assistance: Independent with IADLs Employment: Retired   PATIENT GOALS   Pt states she wants to drive again  SUBJECTIVE STATEMENT: Pt alert, cooperative.  Pt accompanied by: self  OBJECTIVE:   TODAY'S TREATMENT: Skilled treatment session focused on pt's cognitive communication goals. SLP facilitated session by providing the following interventions:  I didn't drop any of my medicines this time, I was very careful  Pt engaged in higher level complex abstract conversation with good verbal organization and word finding during today's session  PATIENT EDUCATION: Education details: as above Person educated: Patient Education method: Verbal cues Education comprehension: verbalized understanding and needs further education   HOME EXERCISE PROGRAM:   See above and put list of therapy sessions on August Calendar - new calendar printed  GOALS:  Goals reviewed with patient? Yes  SHORT TERM GOALS: Target date: 10 sessions            Updated 03/05/2024 The patient will sustain attention in simple cognitive linguistic task for 5 minutes given min verbal cues.  Baseline: Goal status:  INITIAL: MET: upgraded to With Min A, pt will demonstrate alternating attention between 2 basic tasks in 5 out of 7 opportunities.    2.  Patient will reduce impulsivity by preplanning steps before completing a task with moderate verbal cues. Baseline:  Goal status: INITIAL: MET   3.  Patient will complete Patient Reported Outcome Measure to assess self-perception of deficits.  Baseline:  Goal status: INITIAL: MET   4.  With Min A, patient will complete complex visual-spatial activities with 75% accuracy.               Baseline:               Goal status: INITIAL   5. With Mod I, pt will recall and follow therapy schedule with 80% accuracy.               Baseline:              Goal status: INITIAL         LONG TERM GOALS: Target date: 04/27/2024             Updated 03/05/2024 Patient will alternate attention between two moderately complex tasks 80% acc, with min verbal cues.  Baseline:  Goal status: INITIAL: great progress made   2.  Patient will demonstrate emergent awareness by identifying 75% of  errors with min cues for double checking. Baseline:  Goal status: INITIAL: great progress made   3.  Patient will recall safety precautions/mobility recommendations using visual aids if necessary to reduce risk for falls. Baseline:  Goal status: INITIAL: great progress made   ASSESSMENT:  CLINICAL IMPRESSION: Based on recent assessment, Dixie Coppa presents with overall moderate cognitive communication impairment per standardized testing using the Cognitive Linguistic Quick Test. Primary deficit areas (severe) include attention, executive function, and visuospatial skills. Patient's impulsivity contributed significantly to testing errors, as did poor sustained attention to testing instructions. Impulsivity impacting safety at home, contributing to fall at home. Patient and sister report reduced frustration tolerance. She is often frustrated when being given instructions. Patient  demonstrated some emergent awareness of errors on tasks such as mazes, but was unaware of perseverations in design generation and generative naming, and errors in clock drawing, symbol cancellation, and symbol trails tasks.   Pt eager throughout session to target higher level cognitive skills in the hopes of returning to driving. See the above treatment note for details on progress.   OBJECTIVE IMPAIRMENTS include attention, memory, awareness, and executive functioning. These impairments are limiting patient from managing medications, managing appointments, managing finances, household responsibilities, ADLs/IADLs, and effectively communicating at home and in community. Factors affecting potential to achieve goals and functional outcome are ability to learn/carryover information and severity of impairments. Patient will benefit from skilled SLP services to address above impairments and improve overall function.  REHAB POTENTIAL: Good  PLAN: SLP FREQUENCY: 1-2x/week  SLP DURATION: 12 weeks  PLANNED INTERVENTIONS: Environmental controls, Cueing hierachy, Cognitive reorganization, Internal/external aids, Functional tasks, SLP instruction and feedback, Compensatory strategies, Patient/family education, and 07492 Treatment of speech (30 or 45 min)    Daily Doe B. Rubbie, M.S., CCC-SLP, Tree surgeon Certified Brain Injury Specialist [facility]  [facility] Rehabilitation Services Office [phone] Ascom [phone] Fax [phone]

## 2024-04-02 ENCOUNTER — Ambulatory Visit: Admitting: Speech Pathology

## 2024-04-02 ENCOUNTER — Ambulatory Visit: Admitting: Occupational Therapy

## 2024-04-02 ENCOUNTER — Ambulatory Visit: Admitting: Physical Therapy

## 2024-04-02 DIAGNOSIS — R41841 Cognitive communication deficit: Secondary | ICD-10-CM | POA: Diagnosis not present

## 2024-04-02 DIAGNOSIS — R269 Unspecified abnormalities of gait and mobility: Secondary | ICD-10-CM

## 2024-04-02 DIAGNOSIS — I69351 Hemiplegia and hemiparesis following cerebral infarction affecting right dominant side: Secondary | ICD-10-CM

## 2024-04-02 DIAGNOSIS — R2681 Unsteadiness on feet: Secondary | ICD-10-CM

## 2024-04-02 DIAGNOSIS — M6281 Muscle weakness (generalized): Secondary | ICD-10-CM

## 2024-04-02 DIAGNOSIS — R278 Other lack of coordination: Secondary | ICD-10-CM

## 2024-04-02 NOTE — Therapy (Signed)
 Occupational Therapy Progress/Recertification Note  Dates of reporting period  03/03/24   to   04/02/24    Patient Name: Haley Jones MRN: [id number] DOB:[birth year], 68 y.o., female Today's Date: 04/02/2024  PCP: Cleotilde Oneil FALCON, MD REFERRING PROVIDER: Pegge Toribio PARAS, PA-C   OT End of Session - 04/02/24 1729     Visit Number 20    Number of Visits 48    Date for OT Re-Evaluation 06/25/24    OT Start Time 1445    OT Stop Time 1530    OT Time Calculation (min) 45 min    Activity Tolerance Patient tolerated treatment well    Behavior During Therapy [facility] for tasks assessed/performed                  Past Medical History:  Diagnosis Date   Adnexal mass    Anxiety    Chronic back pain    Depression    Diabetes mellitus without complication (HCC)    Endometriosis 03/12/2018   GERD (gastroesophageal reflux disease)    Heart murmur 02/2018   undetected until she was an adult. 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   Weight loss 12/06/2017   Past Surgical History:  Procedure Laterality Date   ABDOMINAL HYSTERECTOMY  2008   ovaries also   CESAREAN SECTION  1994   COLONOSCOPY     COLONOSCOPY WITH PROPOFOL  N/A 02/12/2018   Procedure: COLONOSCOPY WITH PROPOFOL ;  Surgeon: Toledo, Ladell POUR, MD;  Location: [facility] ENDOSCOPY;  Service: Gastroenterology;  Laterality: N/A;   ESOPHAGOGASTRODUODENOSCOPY (EGD) WITH PROPOFOL  N/A 02/12/2018   Procedure: ESOPHAGOGASTRODUODENOSCOPY (EGD) WITH PROPOFOL ;  Surgeon: Toledo, Ladell POUR, MD;  Location: [facility] ENDOSCOPY;  Service: Gastroenterology;  Laterality: N/A;   EYE SURGERY Left 1973   muscle shortening to straighten cross eyes   LAPAROSCOPY N/A 02/20/2018   Procedure: LAPAROSCOPY DIAGNOSTIC, ( RESECTION OF RIGHT LOWER QUADRANT ABDOMINAL MASS);  Surgeon: Nicholaus Selinda Birmingham, MD;  Location: [facility] ORS;  Service: General;  Laterality: N/A;   ROTATOR  CUFF REPAIR Left 2011   Patient Active Problem List   Diagnosis Date Noted   Depression with anxiety 01/04/2024   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   Acute CVA (cerebrovascular accident) (HCC) 12/20/2023   Primary hypertension 12/20/2023   Acute kidney injury superimposed on chronic kidney disease (HCC) 12/20/2023   DM type 2 with diabetic mixed hyperlipidemia (HCC) 11/17/2022   Major depressive disorder, recurrent, 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  Rationale for Evaluation and Treatment: Rehabilitation  SUBJECTIVE:   SUBJECTIVE STATEMENT:  Pt. Is looking forward to taking measurements next visit Pt accompanied by: self  PERTINENT HISTORY: Pt. Is a 68 yo female with hx of a L MCA CVA. Pt. Was admitted into ED on 12/20/2023 with an onset of RUE weakness, numbness dysmetria, and visual deficits. Upon assessment, it was concluded that Pt. Experienced multiple infarcts in the L Parietal/Occipital region. PMHx: Dizziness, Falls  PRECAUTIONS: None  WEIGHT BEARING RESTRICTIONS: No  PAIN:  Are you having pain?  no   04/02/24: No pain 03/12/2024: 6/10 pain at the R dorsal forearm during gross grip strengthening task. 03/10/2024: No pain today. 03/05/2024: No pain, 2-3/10 level of discomfort during use of green resisitve clips.    FALLS: Has patient fallen in last 6 months? Yes. Number of falls 10  LIVING ENVIRONMENT: Lives with: lives with their family and lives alone Lives in: House/apartment Stairs: No, Ramp Has following equipment at home: Walker - 2 wheeled, Shower  bench, and bed side commode  PLOF: Independent  PATIENT GOALS: To be able to feed herself and hold a fork and knife better.   OBJECTIVE:  Note: Objective measures were completed at Evaluation unless otherwise noted.  HAND DOMINANCE: Left  ADLs: Overall ADLs:  Transfers/ambulation related to ADLs:  Eating: Independent  Grooming: independent UB Dressing: Pt. Has difficulty managing buttons LB Dressing: jean zippers are difficult to manipulate, hiking pants, and clothing negotiation are difficult. Toileting: Independent Bathing: Independent Tub Shower transfers: stepping into shower is difficult.  Equipment: Grab bars, shower bench, bedside commode  IADLs: Shopping: Does not currently do that/has not tried Light housekeeping: Does not currently do that/has not tried Meal Prep: Has not cooked in months, did not prior to CVA, sister provides meals. Community mobility: Relies on family, and friends Medication management: Assistance from sister to initiate medication management, she gives herself a shot for diabetes Financial management: Sister is assisting with monthly bill management Handwriting: Cursive form is 10% legible; Printed form is 90% legible  MOBILITY STATUS: Needs Assist: CGA and Hx of falls  POSTURE COMMENTS:  No Significant postural limitations Sitting balance: Good  ACTIVITY TOLERANCE: Activity tolerance: Good  FUNCTIONAL OUTCOME MEASURES: MAM-20: TBD  UPPER EXTREMITY ROM:    Active ROM Right eval Right 03/03/2024 Right  04/02/24 Left Eval [facility]  Shoulder flexion 50(58) 61(74) 62(76)   Shoulder abduction 43(48) 45(58) 52(58)   Shoulder adduction      Shoulder extension      Shoulder internal rotation      Shoulder external rotation      Elbow flexion 118(131) 125(128) 142(144)   Elbow extension -24(-22) -5(0) 0(0)   Wrist flexion 20 (21) 42(48) 64(72)   Wrist extension 30(31) 30(34) 52(58)   Wrist ulnar deviation      Wrist radial deviation       Wrist pronation      Wrist supination      (Blank rows = not tested)  UPPER EXTREMITY MMT:     MMT Right eval Right 03/03/2024 Right 04/02/24 Left eval  Shoulder flexion 2/5 2/5 2/5 WFL  Shoulder abduction 2/5 2/5 2/5 [facility]  Shoulder adduction      Shoulder extension      Shoulder internal rotation      Shoulder external rotation      Middle trapezius      Lower trapezius      Elbow flexion 4-/5 4/5 4/5 WFL  Elbow extension 4-/5 4/5 4/5 WFL  Wrist flexion 2+/5 4-/5 4-/5 WFL  Wrist extension 3-/5 4-/5 4-/5 WFL  Wrist ulnar deviation      Wrist radial deviation      Wrist pronation      Wrist supination      (Blank rows = not tested)  HAND FUNCTION: Grip strength: Right: 21 lbs; Left: 14 lbs, Lateral pinch: Right: 7 lbs, Left: 4 lbs, and 3 point pinch: Right: 4 lbs, Left: 5 lbs  03/03/2024:  Grip strength: Right: 23#, Left:  28#, Lateral pinch: Right: 6#, Left: 5#, 3 point pinch: Right: 6#, Left: 3#  04/02/24:  Grip strength: Right: 31#, Left: 35#, Lateral pinch: Right: 8#, Left: 7#, 3 point pinch: Right: 8#, Left: 6#   COORDINATION: 9 Hole Peg test: Right: 72 sec; Left: 40 sec  03/03/2024: 9 hole peg test: Right: 1 min 3 sec, Left: 36 sec  04/02/24  9 hole peg test: Right: 53 sec, Left: 30 sec    SENSATION: Light touch: Impaired  Proprioception: Impaired   EDEMA: none  MUSCLE TONE: RUE: Mild  COGNITION: Overall cognitive status: Impaired  VISION: Subjective report: Pt. Has not noticed the changes in vision but her sister has. Caregiver reports that Pt. Does not bring much attention to the R side.  Baseline vision:  Visual history:   VISION ASSESSMENT: TBD   PERCEPTION: TBD  PRAXIS: Impaired: Motor planning                                                                                                                           TREATMENT DATE: 04/02/2024      Measurements were obtained, and goals were reviewed with the Pt. And caregiver  PATIENT  EDUCATION: Education details: BUE functioning, FMC, goals, POC Person educated: Patient and Sister Education method: Explanation, Demonstration, Tactile cues, and Verbal cues Education comprehension: verbalized understanding and returned demonstration  HOME EXERCISE PROGRAM: Yellow theraputty HEP   GOALS: Goals reviewed with patient? Yes  SHORT TERM GOALS: Target date: 03/10/2024    Pt. Will be independent utilizing HEPs for hand strengthening exercises.  Baseline: Eval: No current HEP program Goal status: INITIAL   LONG TERM GOALS: Target date: 04/21/2024   Pt. Will improve BUE grip strength by 5# of force to be able to open jars. Baseline: 04/02/24: Grip strength: Right: 31#, Left: 35# 03/03/2024: R grip strength: 23#, L grip strength: 28#. Pt. has difficulty securely holding items in her hand. Eval: R grip strength: 21#, L grip strength: 14# Goal status: 04/02/24 achieved; revised to open jars   2.  Pt. Will improve RUE [facility] skills by 3 sec. to be able to manipulate  small objects. Baseline: 04/02/24: 9 hole peg test: Right: 53 sec, Left: 30 sec 03/03/2024: 9 hole peg test: R side: 1 min 3 sec, L side: 36 sec. 9 hole peg test; R side: 72 secs, L side: 40 secs Goal status: progressing,revised 8/20 for manipulating small objects.  3.  Pt. Will increase R shoulder flex/shoulder ABD ROM by 10 degrees to perform ADLs/IADLs.  Baseline: 04/02/24: Right shoulder flexion: 62(76), Abduction: 52(58) 03/03/2024: R shoulder flex: 61(74), R shoulder ABD: 45(58) R shoulder flex: 50(58), R shoulder ABD: 43(48) Goal status: progressing, ongoing  4.  Pt. Will improve handwriting to 50% legible in cursive form, legibility to be able to send written correspondence Baseline: 03/03/2024: 50% legible in cursive form, Eval: 10% legible in cursive form, 90% legible in printed form. Goal status: Discontinue goal per Pt. report that  it has returned to baseline.  5.  Pt. Will improve BUE pinch strength by 2# of  force to  be able to open medication bottles Baseline: 04/02/24: Lateral pinch: Right: 8#, Left: 7#, 3 point pinch: Right: 8#, Left: 6# 03/03/2024: Lateral Pinch: R: 6#, L: 5#, 3 pt pinch: R: 6#, L: 3#.  Eval: Lateral Pinch: 7, R 3 pt. Pinch: 4, L Lateral Pinch: 4, L 3 pt. Pinch: 5 Goal status:  04/02/24: Achieved; revised to be able to open medication bottles  6.  Pt. Will increase R wrist extension/flexion by 5 degrees in anticipation of reaching hold of items for ADLs. Baseline: 04/02/24: 52(58) 03/03/2024: R wrist ext: 30 (34), R Wrist flex: 42(48) Eval: R Wrist ext: 20(21), R Wrist flex: 30(31) Goal status: Achieved  ASSESSMENT:  CLINICAL IMPRESSION:  Pt. Has made progress this progress reporting period. Pt. has improved with ROM in the right shoulder flexion, and abduction, wrist extension, bilateral grip strength, pinch strength, and FMC skills. Goals were revised for grip strength to be able to open jars, and pinch strength to be able to open medication bottles, and [facility] skills to be able to manipulate small objects. The goal for right wrist extension was achieved. Pt. Continues to work on improving ROM for Right shoulder flexion, and abduction. Pt. Requires the frequency of treatment to be modified to 1 time a week. Pt. continues to benefit from OT services to work on RUE ROM, RUE strengthening, bilateral grip strength, bilateral pinch strength, bilateral [facility] skills, and Right sided awareness and attention in order to improve functional independence of ADL/IADL tasks at home.   PERFORMANCE DEFICITS: in functional skills including ADLs, IADLs, coordination, dexterity, proprioception, sensation, tone, ROM, strength, pain, Fine motor control, Gross motor control, endurance, and UE functional use, and psychosocial skills including environmental adaptation, habits, interpersonal interactions, and routines and behaviors.   IMPAIRMENTS: are limiting patient from ADLs, IADLs, rest and sleep, work,  leisure, and social participation.   CO-MORBIDITIES: may have co-morbidities  that affects occupational performance. Patient will benefit from skilled OT to address above impairments and improve overall function.  MODIFICATION OR ASSISTANCE TO COMPLETE EVALUATION: Min-Moderate modification of tasks or assist with assess necessary to complete an evaluation.  OT OCCUPATIONAL PROFILE AND HISTORY: Detailed assessment: Review of records and additional review of physical, cognitive, psychosocial history related to current functional performance.  CLINICAL DECISION MAKING: Moderate - several treatment options, min-mod task modification necessary  REHAB POTENTIAL: Good  EVALUATION COMPLEXITY: Moderate    PLAN:  OT FREQUENCY: 1x/week  OT DURATION: 12 weeks  PLANNED INTERVENTIONS: 97535 self care/ADL training, 02889 therapeutic exercise, 97530 therapeutic activity, 97112 neuromuscular re-education, 97018 paraffin, 02989 moist heat, 97010 cryotherapy, 97034 contrast bath, 97032 electrical stimulation (manual), passive range of motion, visual/perceptual remediation/compensation, energy conservation, patient/family education, and DME and/or AE instructions  RECOMMENDED OTHER SERVICES: ST & PT  CONSULTED AND AGREED WITH PLAN OF CARE: Patient and family member/caregiver  PLAN FOR NEXT SESSION: treatment   Jvon Meroney, MS, OTR/L   04/02/2024, 5:34 PM

## 2024-04-02 NOTE — Therapy (Signed)
 OUTPATIENT SPEECH LANGUAGE PATHOLOGY  COGNITIVE-COMMUNICATION TREATMENT   Patient Name: Amilah Greenspan MRN: [id number] DOB:July 20, 1956, 68 y.o., female Today's Date: 04/02/2024  PCP: Cleotilde Oneil FALCON, MD  REFERRING PROVIDER: Pegge Toribio PARAS, PA-C    End of Session - 04/02/24 1313     Visit Number 18    Number of Visits 25    Date for SLP Re-Evaluation 04/27/24    Authorization Type [employer]    Authorization Time Period 01/28/2024 thru 04/27/2024    Authorization - Visit Number 18    Authorization - Number of Visits 24    Progress Note Due on Visit 20    SLP Start Time 1315    SLP Stop Time  1400    SLP Time Calculation (min) 45 min    Activity Tolerance Patient tolerated treatment well          Past Medical History:  Diagnosis Date   Adnexal mass    Anxiety    Chronic back pain    Depression    Diabetes mellitus without complication (HCC)    Endometriosis 03/12/2018   GERD (gastroesophageal reflux disease)    Heart murmur 02/2018   undetected until she was an adult. 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   Weight loss 12/06/2017   Past Surgical History:  Procedure Laterality Date   ABDOMINAL HYSTERECTOMY  2008   ovaries also   CESAREAN SECTION  1994   COLONOSCOPY     COLONOSCOPY WITH PROPOFOL  N/A 02/12/2018   Procedure: COLONOSCOPY WITH PROPOFOL ;  Surgeon: Toledo, Ladell POUR, MD;  Location: [facility] ENDOSCOPY;  Service: Gastroenterology;  Laterality: N/A;   ESOPHAGOGASTRODUODENOSCOPY (EGD) WITH PROPOFOL  N/A 02/12/2018   Procedure: ESOPHAGOGASTRODUODENOSCOPY (EGD) WITH PROPOFOL ;  Surgeon: Toledo, Ladell POUR, MD;  Location: [facility] ENDOSCOPY;  Service: Gastroenterology;  Laterality: N/A;   EYE SURGERY Left 1973   muscle shortening to straighten cross eyes   LAPAROSCOPY N/A 02/20/2018   Procedure: LAPAROSCOPY DIAGNOSTIC, ( RESECTION OF RIGHT LOWER QUADRANT ABDOMINAL MASS);   Surgeon: Nicholaus Selinda Birmingham, MD;  Location: [facility] ORS;  Service: General;  Laterality: N/A;   ROTATOR CUFF REPAIR Left 2011   Patient Active Problem List   Diagnosis Date Noted   Urinary incontinence as sequela of cerebrovascular accident (CVA) 02/11/2024   Depression with anxiety 01/04/2024   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   Acute CVA (cerebrovascular accident) (HCC) 12/20/2023   Primary hypertension 12/20/2023   Acute kidney injury superimposed on chronic kidney disease (HCC) 12/20/2023   DM type 2 with diabetic mixed hyperlipidemia (HCC) 11/17/2022   Major depressive disorder, recurrent, 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/17/2022   B12 deficiency 02/23/2021   SBO (small bowel obstruction) (HCC) 01/29/2019   Barrett's esophagus without dysplasia 04/15/2018   Weight loss 12/06/2017   Hoarse 12/06/2017   Lumbar disc disease 07/26/2017   Diabetes mellitus type 2, controlled, without complications (HCC) 02/09/2015   Surgical menopause 11/26/2014    ONSET DATE: 12/20/23   REFERRING DIAG: CVA  THERAPY DIAG:  Cognitive communication deficit  Rationale for Evaluation and Treatment Rehabilitation  SUBJECTIVE:   PERTINENT HISTORY: Doraine K. Rabe is a 68 year old left-handed female who presented to [facility] on 12/20/23 with right side weakness/falls and dizziness. MRI of the brain showed patchy acute cortical/subcortical infarcts within the left parietal occipital lobes individually measuring up to 3 cm (MCA vascular territory). Subtle petechial hemorrhage associated with some of these infarcts.  No frank hemorrhagic conversion.  Additional punctate left MCA territory cortical acute infarct within the left insula.  Background parenchymal atrophy and chronic small vessel ischemic disease with chronic lacunar infarcts.  CT angio showed severe stenosis/nonocclusive thrombus of the left MCA bifurcation affecting the inferior division.  Nonocclusive embolus also visible within the left parietal branch as  well. Echocardiogram ejection fraction of 60 to 65% no wall motion abnormalities grade 1 diastolic dysfunction. Past medical history also noted for hypertension, B12 deficiency, type 2 diabetes mellitus, hyperlipidemia, CKD stage III, depression.  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 D/c home after CIR admission 12/28/23-01/18/24. Sister lives nearby and checks on patient throughout the day, assists with meals, bathing, and medications.  DIAGNOSTIC FINDINGS: see above  PAIN:  Are you having pain? No   FALLS: Has patient fallen in last 6 months?  Yes, Number of falls: 1 since returning home from rehab, multiple prior to hospitalization  LIVING ENVIRONMENT: Lives with: lives alone Lives in: House/apartment  PLOF:  Level of assistance: Independent with IADLs Employment: Retired   PATIENT GOALS   Pt states she wants to drive again  SUBJECTIVE STATEMENT: Pt alert, cooperative. Pt's sister attended session Elray) Pt accompanied by: self, her sister Elray)  OBJECTIVE:   TODAY'S TREATMENT: Skilled treatment session focused on pt's cognitive communication goals. SLP facilitated session by providing the following interventions:  I didn't drop any of my medicines this time, I was very careful  Pt's sister is in agreement with pt's progress in completing tasks.   SLP further facilitated session by providing maximal multimodal cues for visual scanning task faded to moderate cues. Recommend that when out in the community, pt's sister provided visual heads up cues to objects on pt's right  PATIENT EDUCATION: Education details: as above Person educated: Patient Education method: Verbal cues Education comprehension: verbalized understanding and needs further education   HOME EXERCISE PROGRAM:   See above and put list of therapy sessions on August Calendar - new calendar printed  GOALS:  Goals reviewed  with patient? Yes  SHORT TERM GOALS: Target date: 10 sessions            Updated 03/05/2024 The patient will sustain attention in simple cognitive linguistic task for 5 minutes given min verbal cues.  Baseline: Goal status: INITIAL: MET: upgraded to With Min A, pt will demonstrate alternating attention between 2 basic tasks in 5 out of 7 opportunities.    2.  Patient will reduce impulsivity by preplanning steps before completing a task with moderate verbal cues. Baseline:  Goal status: INITIAL: MET   3.  Patient will complete Patient Reported Outcome Measure to assess self-perception of deficits.  Baseline:  Goal status: INITIAL: MET   4.  With Min A, patient will complete complex visual-spatial activities with 75% accuracy.               Baseline:               Goal status: INITIAL   5. With Mod I, pt will recall and follow therapy schedule with 80% accuracy.               Baseline:              Goal status: INITIAL         LONG TERM GOALS: Target date: 04/27/2024             Updated  03/05/2024 Patient will alternate attention between two moderately complex tasks 80% acc, with min verbal cues.  Baseline:  Goal status: INITIAL: great progress made   2.  Patient will demonstrate emergent awareness by identifying 75% of errors with min cues for double checking. Baseline:  Goal status: INITIAL: great progress made   3.  Patient will recall safety precautions/mobility recommendations using visual aids if necessary to reduce risk for falls. Baseline:  Goal status: INITIAL: great progress made   ASSESSMENT:  CLINICAL IMPRESSION: Based on recent assessment, Michelyn Scullin presents with overall moderate cognitive communication impairment per standardized testing using the Cognitive Linguistic Quick Test. Primary deficit areas (severe) include attention, executive function, and visuospatial skills. Patient's impulsivity contributed significantly to testing errors, as did poor  sustained attention to testing instructions. Impulsivity impacting safety at home, contributing to fall at home. Patient and sister report reduced frustration tolerance. She is often frustrated when being given instructions. Patient demonstrated some emergent awareness of errors on tasks such as mazes, but was unaware of perseverations in design generation and generative naming, and errors in clock drawing, symbol cancellation, and symbol trails tasks.   See the above treatment note for details on progress, will continue for additional several sessions to finish up education.   OBJECTIVE IMPAIRMENTS include attention, memory, awareness, and executive functioning. These impairments are limiting patient from managing medications, managing appointments, managing finances, household responsibilities, ADLs/IADLs, and effectively communicating at home and in community. Factors affecting potential to achieve goals and functional outcome are ability to learn/carryover information and severity of impairments. Patient will benefit from skilled SLP services to address above impairments and improve overall function.  REHAB POTENTIAL: Good  PLAN: SLP FREQUENCY: 1-2x/week  SLP DURATION: 12 weeks  PLANNED INTERVENTIONS: Environmental controls, Cueing hierachy, Cognitive reorganization, Internal/external aids, Functional tasks, SLP instruction and feedback, Compensatory strategies, Patient/family education, and 07492 Treatment of speech (30 or 45 min)    Nathanael Krist B. Rubbie, M.S., CCC-SLP, Tree surgeon Certified Brain Injury Specialist [facility]  [facility] Rehabilitation Services Office [phone] Ascom [phone] Fax [phone]

## 2024-04-02 NOTE — Therapy (Signed)
 OUTPATIENT PHYSICAL THERAPY TREATMENT/ Physical Therapy Progress Note   Dates of reporting period  03/03/24   to   04/02/24    Patient Name: Haley Jones MRN: [id number] DOB:1956/08/05, 68 y.o., female Today's Date: 04/02/2024  PCP: Dr. Oneil Pinal REFERRING PROVIDER: Toribio Pitch, PA-C  END OF SESSION:   PT End of Session - 04/02/24 1514     Visit Number 20    Number of Visits 24    Date for PT Re-Evaluation 04/21/24    Authorization Type [employer]    Progress Note Due on Visit 30    PT Start Time 1400    PT Stop Time 1444    PT Time Calculation (min) 44 min    Equipment Utilized During Treatment Gait belt    Activity Tolerance Patient tolerated treatment well    Behavior During Therapy WFL for tasks assessed/performed                 Past Medical History:  Diagnosis Date   Adnexal mass    Anxiety    Chronic back pain    Depression    Diabetes mellitus without complication (HCC)    Endometriosis 03/12/2018   GERD (gastroesophageal reflux disease)    Heart murmur 02/2018   undetected until she was an adult. 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   Weight loss 12/06/2017   Past Surgical History:  Procedure Laterality Date   ABDOMINAL HYSTERECTOMY  2008   ovaries also   CESAREAN SECTION  1994   COLONOSCOPY     COLONOSCOPY WITH PROPOFOL  N/A 02/12/2018   Procedure: COLONOSCOPY WITH PROPOFOL ;  Surgeon: Toledo, Ladell POUR, MD;  Location: [facility];  Service: Gastroenterology;  Laterality: N/A;   ESOPHAGOGASTRODUODENOSCOPY (EGD) WITH PROPOFOL  N/A 02/12/2018   Procedure: ESOPHAGOGASTRODUODENOSCOPY (EGD) WITH PROPOFOL ;  Surgeon: Toledo, Ladell POUR, MD;  Location: [facility];  Service: Gastroenterology;  Laterality: N/A;   EYE SURGERY Left 1973   muscle shortening to straighten cross eyes   LAPAROSCOPY N/A 02/20/2018   Procedure: LAPAROSCOPY DIAGNOSTIC, (  RESECTION OF RIGHT LOWER QUADRANT ABDOMINAL MASS);  Surgeon: Nicholaus Selinda Birmingham, MD;  Location: [facility];  Service: General;  Laterality: N/A;   ROTATOR CUFF REPAIR Left 2011   Patient Active Problem List   Diagnosis Date Noted   Urinary incontinence as sequela of cerebrovascular accident (CVA) 02/11/2024   Depression with anxiety 01/04/2024   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   Acute CVA (cerebrovascular accident) (HCC) 12/20/2023   Primary hypertension 12/20/2023   Acute kidney injury superimposed on chronic kidney disease (HCC) 12/20/2023   DM type 2 with diabetic mixed hyperlipidemia (HCC) 11/17/2022   Major depressive disorder, recurrent, 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/17/2022   B12 deficiency 02/23/2021   SBO (small bowel obstruction) (HCC) 01/29/2019   Barrett's esophagus without dysplasia 04/15/2018   Weight loss 12/06/2017   Hoarse 12/06/2017   Lumbar disc disease 07/26/2017   Diabetes mellitus type 2, controlled, without complications (HCC) 02/09/2015   Surgical menopause 11/26/2014    ONSET DATE: 12/20/2023  REFERRING DIAG: P36.487 (ICD-10-CM) - Left middle cerebral artery stroke (HCC)   THERAPY DIAG:  Abnormality of gait  Unsteadiness on feet  Hemiplegia and hemiparesis following cerebral infarction affecting right dominant side (HCC)  Rationale for Evaluation and Treatment: Rehabilitation  SUBJECTIVE:  SUBJECTIVE STATEMENT:   Denies any falls since last session.  Still has been walking exclusively with her walker when at home due to fear of falling.  Patient reports she is feeling well today.  PERTINENT HISTORY:Pt states she is not sure the exact date of when her CVA happened because she kept falling at home trying to care for herself and her daughter, but she finally went to the  hospital when her sister insisted on it. Pt states her special needs daughter, [name], passed away recently while pt was in the hospital at [facility] before she was transferred to [facility] for stroke rehabilitation.Pt's sister states pt requires supervision/cuing for ADLs  to don clothes correctly, with correct orientation (potential apraxia?) Pt/sister report pt has R side inattention.Pt reports supposedly one of her legs is shorter than the other, believes her R LE might be the shorter one. 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     PAIN:  Are you having pain? No  PRECAUTIONS: Fall RED FLAGS: None  WEIGHT BEARING RESTRICTIONS: No FALLS: Has patient fallen in last 6 months? Yes. Number of falls 6+ before going to hospital and 1 fall since being home from [facility] - pt states she was trying to gather her clothes by herself rather than waiting for her sister to get there to help her  LIVING ENVIRONMENT Lives with: lives alone and but pt's sister, [name], comes over daily to assist with ADLs and stays to provide supervision all day - pt states at night she would be able to walk to bathroom using RW if needed (but she wears depends in event of incontinence) Lives in: House/apartment Stairs: she has steps, but she doesn't have to use them (house was wheelchair accessible for her daughter)  Has following equipment at home: Environmental consultant - 2 wheeled, shower chair, Grab bars, Ramped entry, and transport chair  PLOF: Independent, Independent with household mobility without device, Independent with homemaking with ambulation, Independent with gait, Independent with transfers, and she was the primary caregiver for her recently deceased daughter   CLOF: Sister provides supervision daily for pt to ambulate using RW safely and provides assist for ADLs (showering and dressing - progressing towards supervision with these activities); Sister states pt requires  cues to turn and step back fully prior to sitting to ensure her safety  PATIENT GOALS: get back to being able to go shopping with improved community level ambulation without the walker; return to driving   OBJECTIVE:  Note: Objective measures were completed at Evaluation unless otherwise noted.  DIAGNOSTIC FINDINGS:   EXAM: MRI HEAD WITHOUT CONTRAST   TECHNIQUE: Multiplanar, multiecho pulse sequences of the brain and surrounding structures were obtained without intravenous contrast.   COMPARISON:  Brain MRI 07/31/2022.  IMPRESSION: 1. Patchy acute cortical/subcortical infarcts within the left parietal and occipital lobes individually measuring up to 3 cm (MCA vascular territory). Subtle petechial hemorrhage associated with some of these infarcts. No frank hemorrhagic conversion. 2. Additional punctate left MCA territory cortical acute infarct within the left insula. 3. Background parenchymal atrophy and chronic small vessel ischemic disease with chronic lacunar infarcts, as described. 4. Paranasal sinus disease as outlined (including severe right maxillary sinusitis).   Electronically Signed   By: Rockey Childs D.O.   On: 12/20/2023 11:52  COGNITION: Overall cognitive status: Within functional limits for tasks assessed   SENSATION: Light touch: initially misses the first 1-2 touches on R LE, but then  able to feel remaining touches Proprioception: Impaired grossly  COORDINATION: Symmetrical heel-to-shin bilaterally  EDEMA:  Not formally assessed, but none observed  MUSCLE TONE: Not formally assessed  POSTURE: rounded shoulders, forward head, and posterior pelvic tilt  LOWER EXTREMITY MMT:    MMT Right Eval Left Eval  Hip flexion 3+, no pain 3+ with some L hip and back pain with this  Hip extension    Hip abduction    Hip adduction    Hip internal rotation    Hip external rotation    Knee flexion 3+ 4  Knee extension 4- 4  Ankle dorsiflexion 3+ 4  Ankle  plantarflexion 4- 4  Ankle inversion    Ankle eversion    (Blank rows = not tested)  BED MOBILITY:  Findings: Sit to supine need to assess Supine to sit need to assess *pt reports some difficulty with bed mobility  TRANSFERS: Sit to stand: SBA  Assistive device utilized: Environmental consultant - 2 wheeled     Stand to sit: SBA  Assistive device utilized: Environmental consultant - 2 wheeled     Chair to chair: SBA  Assistive device utilized: Environmental consultant - 2 wheeled       GAIT: Findings: Gait Characteristics: slight R ankle instability noted, step through pattern, decreased stance time- Right, decreased stride length, and decreased ankle dorsiflexion- Right, Distance walked: ~174ft, Assistive device utilized:Walker - 2 wheeled, Level of assistance: SBA and CGA, and Comments:    FUNCTIONAL TESTS:  5 times sit to stand: 38.75 seconds, using UE support 10 meter walk test: 0.48 m/s using youth RW, requires CGA for safety 6 minute walk test: Visit 2, see note Berg Balance Scale: Visit 2, see note Functional gait assessment: need to assess, when appropriate  PATIENT SURVEYS:  ABC scale 7.5%                                                                                                                             TREATMENT DATE: 04/02/2024  Physical Performance Test or Measurement: a  physical performance test(s) or measurement (eg,  musculoskeletal, functional capacity), with written report,  each 15 mins   Five times Sit to Stand Test (FTSS)  TIME: 8/20:16.86 sec no UE support   Cut off scores indicative of increased fall risk: >12 sec CVA, >16 sec PD, >13 sec vestibular (ANPTA Core Set of Outcome Measures for Adults with Neurologic Conditions, 2018)  10 Meter Walk Test: Patient instructed to walk 10 meters (32.8 ft) as quickly and as safely as possible at their normal speed Results: .76 m/s (13.1 seconds with RW)  Cut off scores:   Household Ambulator  < 0.4 m/s  Limited Community Ambulator  0.4 - 0.8 m/s   [employer]  > 0.8 m/s  Increased fall risk  < 1.88m/s  Crossing a Street  >1.73m/s  MCID 0.05 m/s (small), 0.13 m/s (moderate), 0.06 m/s (significant)  (ANPTA Core Set of Outcome Measures for Adults with  Neurologic Conditions, 2018)   6 Min Walk Test:  Instructed patient to ambulate as quickly and as safely as possible for 6 minutes using LRAD. Patient was allowed to take standing rest breaks without stopping the test, but if the patient required a sitting rest break the clock would be stopped and the test would be over.  Results: 900 feet using a RW . Results indicate that the patient has reduced endurance with ambulation compared to age matched norms.  Age Matched Norms (in meters): 17-69 yo M: 54 F: 25, 68-79 yo M: 49 F: 471, 33-89 yo M: 417 F: 392 MDC: 58.21 meters (190.98 feet) or 50 meters (ANPTA Core Set of Outcome Measures for Adults with Neurologic Conditions, 2018)  Patient demonstrates increased fall risk as noted by score of  43 /56 on Berg Balance Scale.  (<36= high risk for falls, close to 100%; 37-45 significant >80%; 46-51 moderate >50%; 52-55 lower >25%)  [facility] PT Assessment - 04/02/24 0001       Berg Balance Test   Sit to Stand Able to stand without using hands and stabilize independently    Standing Unsupported Able to stand safely 2 minutes    Sitting with Back Unsupported but Feet Supported on Floor or Stool Able to sit safely and securely 2 minutes    Stand to Sit Sits safely with minimal use of hands    Transfers Able to transfer safely, definite need of hands    Standing Unsupported with Eyes Closed Able to stand 10 seconds safely    Standing Unsupported with Feet Together Able to place feet together independently and stand 1 minute safely    From Standing, Reach Forward with Outstretched Arm Can reach forward >12 cm safely (5)    From Standing Position, Pick up Object from Floor Able to pick up shoe, needs supervision    From Standing Position, Turn to  Look Behind Over each Shoulder Looks behind one side only/other side shows less weight shift    Turn 360 Degrees Able to turn 360 degrees safely but slowly    Standing Unsupported, Alternately Place Feet on Step/Stool Able to complete >2 steps/needs minimal assist    Standing Unsupported, One Foot in Front Able to plae foot ahead of the other independently and hold 30 seconds    Standing on One Leg Tries to lift leg/unable to hold 3 seconds but remains standing independently    Total Score 43          Unless otherwise stated, CGA was provided and gait belt donned in order to ensure pt safety     NMR: To facilitate reeducation of movement, balance, posture, coordination, and/or proprioception/kinesthetic sense.  Activity Description: Blaze pods set up ant to pt in front of feet with task of tapping pod associated with foot and color of light. Pt has frequent errors with color dual task   TA- To improve functional movements patterns for everyday tasks  Unassisted gait from PT treatment area to OT treatment area.  No assistive device but there was contact-guard assist provided. Unless otherwise stated, CGA was provided and gait belt donned in order to ensure pt safety   PATIENT EDUCATION: Education details: PT POC, findings on assessment today, recommendation to continue using RW for all functional mobility Person educated: Patient and Caregiver [name] Education method: Explanation Education comprehension: verbalized understanding and needs further education  HOME EXERCISE PROGRAM: Access Code: [email or web address] URL: [email or web address] Date: 02/04/2024 Prepared by: Peggye Linear  Exercises - Standing March  with Counter Support  - 2 x daily - 7 x weekly - 2 sets - 20 reps - Side stepping with counter support: yellow band loop  - 2 x daily - 7 x weekly - 2 sets - 20 reps - Sit to Stand with Counter Support  - 2 x daily - 7 x weekly - 2 sets - 12 reps   GOALS: Goals  reviewed with patient? Yes  SHORT TERM GOALS: Target date: 03/10/2024  Pt will be independent with HEP in order to improve strength and balance in order to decrease fall risk and improve function at home and work.  Baseline: need to initiate Goal status:  ONGOING: 7/16: 2-3x week  LONG TERM GOALS: Target date: 04/21/2024  1.  Patient will complete five times sit to stand test in < 15 seconds indicating an increased LE strength and improved balance. Baseline:  38.75 seconds, using UE support, 7/16: 20.35 sec, UE support on 1st STS only 8/20:16.86 sec no UE support  Goal Status: ONGOING   2.  Patient will increase ABC scale score >80% to demonstrate better functional mobility and better confidence with ADLs.   Baseline: 7.5%, 7/16: 18.125% 8/20: 18.75% Goal status: ONGOING   3.  Patient will increase Berg Balance score by > 6 points to demonstrate decreased fall risk during functional activities. Baseline: need to assess; 01/30/2024= 34/56, 7/16= 39 8/20:42 Goal status: MET   4. Patient will increase 10 meter walk test to >1.68m/s as to improve gait speed for better community ambulation and to reduce fall risk. Baseline: 0.48 m/s using youth RW, requires CGA for safety, 7/16: 0.69 m/s 8/20:  .76 m/s Goal status: ONGOING  5. Patient will increase six minute walk test distance to >1000 for progression to community ambulator and improve gait ability Baseline: need to assess; 01/30/2024=410 feet using RW; 02/13/24: 442ft in 95m30s, 7/16: 635ft using 2WW 8/20: 900 ft with RW  Goal status: ONGOING   ASSESSMENT:  CLINICAL IMPRESSION:    Patient presents to physical therapy for progress note this date.  Patient shows progress with cross all functional testing performed today.  Patient shows great progress of 5 times sit to stand indicating improved lower extremity strength and power and decrease risk of falls.  Patient also shows improved distance with 6-minute walk test showing improved ability to  ambulate within her home and community.  In addition patient shows progress with her overall gait speed showing again decreased risk of falls.  In addition patient's Berg balance scale continues to show improvements indicating improved static balance.  Overall patient showing great progress of physical therapy interventions and will continue to benefit from skilled physical therapy to address her impairments and improve her quality of life.Patient's condition has the potential to improve in response to therapy. Maximum improvement is yet to be obtained. The anticipated improvement is attainable and reasonable in a generally predictable time.     OBJECTIVE IMPAIRMENTS: Abnormal gait, decreased activity tolerance, decreased balance, decreased endurance, decreased knowledge of use of DME, decreased mobility, difficulty walking, decreased strength, decreased safety awareness, impaired vision/preception, and pain.   ACTIVITY LIMITATIONS: carrying, lifting, bending, standing, squatting, stairs, transfers, bed mobility, bathing, toileting, dressing, reach over head, hygiene/grooming, and locomotion level  PARTICIPATION LIMITATIONS: meal prep, cleaning, laundry, driving, shopping, and community activity  PERSONAL FACTORS: Age, Time since onset of injury/illness/exacerbation, and 3+ comorbidities: Hypertension, B12 deficiency, type 2 diabetes, hyperlipidemia, CKD stage IIIa are also affecting patient's functional outcome.   REHAB POTENTIAL: Good  CLINICAL DECISION MAKING: Evolving/moderate complexity  EVALUATION COMPLEXITY: Moderate  PLAN:  PT FREQUENCY: 1-2x/week  PT DURATION: 12 weeks  PLANNED INTERVENTIONS: [id number]- PT Re-evaluation, 97750- Physical Performance Testing, 97110-Therapeutic exercises, 97530- Therapeutic activity, [id number]- Neuromuscular re-education, 97535- Self Care, [id number]- Manual therapy, [id number]- Gait training, [phone]- Orthotic Initial, [phone]- Orthotic/Prosthetic subsequent, [phone]- Canalith  repositioning, [phone]- Electrical stimulation (manual), Patient/Family education, Balance training, Stair training, Taping, Joint mobilization, Spinal mobilization, Vestibular training, Visual/preceptual remediation/compensation, DME instructions, Cryotherapy, Moist heat, and Biofeedback  PLAN FOR NEXT SESSION:   - floor transfers training - continue HIIT - continue gait training without AD for balance and righting (include multi plane, multidirectional)  - dynamic reaching and dynamic stepping balance    Note: Portions of this document were prepared using Dragon voice recognition software and although reviewed may contain unintentional dictation errors in syntax, grammar, or spelling.  Lonni KATHEE Gainer PT ,DPT Physical Therapist- [place]  [facility]    3:15 PM, 04/02/24

## 2024-04-07 ENCOUNTER — Ambulatory Visit: Admitting: Speech Pathology

## 2024-04-07 ENCOUNTER — Ambulatory Visit: Admitting: Occupational Therapy

## 2024-04-07 ENCOUNTER — Ambulatory Visit: Admitting: Physical Therapy

## 2024-04-07 DIAGNOSIS — R41841 Cognitive communication deficit: Secondary | ICD-10-CM | POA: Insufficient documentation

## 2024-04-07 DIAGNOSIS — M6281 Muscle weakness (generalized): Secondary | ICD-10-CM

## 2024-04-07 DIAGNOSIS — R4701 Aphasia: Secondary | ICD-10-CM | POA: Insufficient documentation

## 2024-04-07 DIAGNOSIS — R2681 Unsteadiness on feet: Secondary | ICD-10-CM

## 2024-04-07 DIAGNOSIS — I69351 Hemiplegia and hemiparesis following cerebral infarction affecting right dominant side: Secondary | ICD-10-CM

## 2024-04-07 DIAGNOSIS — R269 Unspecified abnormalities of gait and mobility: Secondary | ICD-10-CM

## 2024-04-07 NOTE — Therapy (Signed)
 Occupational Therapy Neuro Treatment Note   Patient Name: Haley Jones MRN: [id number] DOB:May 19, 1956, 68 y.o., female Today's Date: 04/07/2024  PCP: Cleotilde Oneil FALCON, MD REFERRING PROVIDER: Pegge Toribio PARAS, PA-C   OT End of Session - 04/07/24 2209     Visit Number 21    Number of Visits 48    Date for OT Re-Evaluation 06/25/24    OT Start Time 1450    OT Stop Time 1530    OT Time Calculation (min) 40 min    Activity Tolerance Patient tolerated treatment well    Behavior During Therapy [facility] for tasks assessed/performed                  Past Medical History:  Diagnosis Date   Adnexal mass    Anxiety    Chronic back pain    Depression    Diabetes mellitus without complication (HCC)    Endometriosis 03/12/2018   GERD (gastroesophageal reflux disease)    Heart murmur 02/2018   undetected until she was an adult. 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   Weight loss 12/06/2017   Past Surgical History:  Procedure Laterality Date   ABDOMINAL HYSTERECTOMY  2008   ovaries also   CESAREAN SECTION  1994   COLONOSCOPY     COLONOSCOPY WITH PROPOFOL  N/A 02/12/2018   Procedure: COLONOSCOPY WITH PROPOFOL ;  Surgeon: Toledo, Ladell POUR, MD;  Location: [facility] ENDOSCOPY;  Service: Gastroenterology;  Laterality: N/A;   ESOPHAGOGASTRODUODENOSCOPY (EGD) WITH PROPOFOL  N/A 02/12/2018   Procedure: ESOPHAGOGASTRODUODENOSCOPY (EGD) WITH PROPOFOL ;  Surgeon: Toledo, Ladell POUR, MD;  Location: [facility] ENDOSCOPY;  Service: Gastroenterology;  Laterality: N/A;   EYE SURGERY Left 1973   muscle shortening to straighten cross eyes   LAPAROSCOPY N/A 02/20/2018   Procedure: LAPAROSCOPY DIAGNOSTIC, ( RESECTION OF RIGHT LOWER QUADRANT ABDOMINAL MASS);  Surgeon: Nicholaus Selinda Birmingham, MD;  Location: [facility] ORS;  Service: General;  Laterality: N/A;   ROTATOR CUFF REPAIR Left 2011   Patient Active Problem List    Diagnosis Date Noted   Depression with anxiety 01/04/2024   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   Acute CVA (cerebrovascular accident) (HCC) 12/20/2023   Primary hypertension 12/20/2023   Acute kidney injury superimposed on chronic kidney disease (HCC) 12/20/2023   DM type 2 with diabetic mixed hyperlipidemia (HCC) 11/17/2022   Major depressive disorder, recurrent, 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  Rationale for Evaluation and Treatment: Rehabilitation  SUBJECTIVE:   SUBJECTIVE STATEMENT:  Pt. Reports that she Is doing well today Pt accompanied by: self  PERTINENT HISTORY: Pt. Is a 68 yo female with hx of a L MCA CVA. Pt. Was admitted into ED on 12/20/2023 with an onset of RUE weakness, numbness dysmetria, and visual deficits. Upon assessment, it was concluded that Pt. Experienced multiple infarcts in the L Parietal/Occipital region. PMHx: Dizziness, Falls  PRECAUTIONS: None  WEIGHT BEARING RESTRICTIONS: No  PAIN:  Are you having pain?  no   04/02/24: No pain 03/12/2024: 6/10 pain  at the R dorsal forearm during gross grip strengthening task. 03/10/2024: No pain today. 03/05/2024: No pain, 2-3/10 level of discomfort during use of green resisitve clips.    FALLS: Has patient fallen in last 6 months? Yes. Number of falls 10  LIVING ENVIRONMENT: Lives with: lives with their family and lives alone Lives in: House/apartment Stairs: No, Ramp Has following equipment at home: Walker - 2 wheeled, Shower bench, and bed side commode  PLOF: Independent  PATIENT GOALS: To be  able to feed herself and hold a fork and knife better.   OBJECTIVE:  Note: Objective measures were completed at Evaluation unless otherwise noted.  HAND DOMINANCE: Left  ADLs: Overall ADLs:  Transfers/ambulation related to ADLs:  Eating: Independent  Grooming: independent UB Dressing: Pt. Has difficulty managing buttons LB Dressing: jean zippers are difficult to manipulate, hiking pants, and clothing negotiation are difficult. Toileting: Independent Bathing: Independent Tub Shower transfers: stepping into shower is difficult.  Equipment: Grab bars, shower bench, bedside commode  IADLs: Shopping: Does not currently do that/has not tried Light housekeeping: Does not currently do that/has not tried Meal Prep: Has not cooked in months, did not prior to CVA, sister provides meals. Community mobility: Relies on family, and friends Medication management: Assistance from sister to initiate medication management, she gives herself a shot for diabetes Financial management: Sister is assisting with monthly bill management Handwriting: Cursive form is 10% legible; Printed form is 90% legible  MOBILITY STATUS: Needs Assist: CGA and Hx of falls  POSTURE COMMENTS:  No Significant postural limitations Sitting balance: Good  ACTIVITY TOLERANCE: Activity tolerance: Good  FUNCTIONAL OUTCOME MEASURES: MAM-20: TBD  UPPER EXTREMITY ROM:    Active ROM Right eval Right 03/03/2024 Right  04/02/24 Left Eval [facility]  Shoulder flexion 50(58) 61(74) 62(76)   Shoulder abduction 43(48) 45(58) 52(58)   Shoulder adduction      Shoulder extension      Shoulder internal rotation      Shoulder external rotation      Elbow flexion 118(131) 125(128) 142(144)   Elbow extension -24(-22) -5(0) 0(0)   Wrist flexion 20 (21) 42(48) 64(72)   Wrist extension 30(31) 30(34) 52(58)   Wrist ulnar deviation      Wrist radial deviation      Wrist pronation      Wrist supination      (Blank rows = not  tested)  UPPER EXTREMITY MMT:     MMT Right eval Right 03/03/2024 Right 04/02/24 Left eval  Shoulder flexion 2/5 2/5 2/5 WFL  Shoulder abduction 2/5 2/5 2/5 [facility]  Shoulder adduction      Shoulder extension      Shoulder internal rotation      Shoulder external rotation      Middle trapezius      Lower trapezius      Elbow flexion 4-/5 4/5 4/5 WFL  Elbow extension 4-/5 4/5 4/5 WFL  Wrist flexion 2+/5 4-/5 4-/5 WFL  Wrist extension 3-/5 4-/5 4-/5 WFL  Wrist ulnar deviation      Wrist radial deviation      Wrist pronation      Wrist supination      (Blank rows = not tested)  HAND FUNCTION: Grip strength: Right: 21 lbs; Left: 14 lbs, Lateral pinch: Right: 7 lbs, Left: 4 lbs, and 3 point pinch: Right: 4 lbs, Left: 5 lbs  03/03/2024:  Grip strength: Right: 23#, Left: 28#, Lateral pinch: Right: 6#, Left: 5#, 3 point pinch: Right: 6#, Left: 3#  04/02/24:  Grip strength: Right: 31#, Left: 35#, Lateral pinch: Right: 8#, Left: 7#, 3 point pinch: Right: 8#, Left: 6#   COORDINATION: 9 Hole Peg test: Right: 72 sec; Left: 40 sec  03/03/2024: 9 hole peg test: Right: 1 min 3 sec, Left: 36 sec  04/02/24  9 hole peg test: Right: 53 sec, Left: 30 sec    SENSATION: Light touch: Impaired  Proprioception: Impaired   EDEMA: none  MUSCLE TONE: RUE: Mild  COGNITION: Overall cognitive status: Impaired  VISION: Subjective report: Pt. Has not noticed the changes in vision but her sister has. Caregiver reports that Pt. Does not bring much attention to the R side.  Baseline vision:  Visual history:   VISION ASSESSMENT: TBD   PERCEPTION: TBD  PRAXIS: Impaired: Motor planning                                                                                                                           TREATMENT DATE: 04/07/2024   Therapeutic Act.:   -Pt. Performed AROM/AAROM/PROM to the right shoulder for flexion, abduction in preparation for functional reaching, external rotation,  and internal rotation in preparation for tucking in clothing ROM was performed 2/2 stiffness -Pt. performed AAROM at the tabletop surface for flexion, and abduction using a tissue buffer to reduce friction. -Lateral, and 3pt. Pinch strengthening using yellow, red, green, and blue level resistive clips. -Incorporated a reaching component  with support required proximally through multiple planes in combination with the task to further challenge distal motor control while moving the proximal UE with support     PATIENT EDUCATION: Education details: BUE functioning,  Right shoulder ROM Person educated: Patient and Sister Education method: Explanation, Demonstration, Tactile cues, and Verbal cues Education comprehension: verbalized understanding and returned demonstration  HOME EXERCISE PROGRAM: Yellow theraputty HEP   GOALS: Goals reviewed with patient? Yes  SHORT TERM GOALS: Target date: 03/10/2024    Pt. Will be independent utilizing HEPs for hand strengthening exercises.  Baseline: Eval: No current HEP program Goal status: INITIAL   LONG TERM GOALS: Target date: 04/21/2024   Pt. Will improve BUE grip strength by 5# of force to be able to open jars. Baseline: 04/02/24: Grip strength: Right: 31#, Left: 35# 03/03/2024: R grip strength: 23#, L grip strength: 28#. Pt. has difficulty securely holding items in her hand. Eval: R grip strength: 21#, L grip strength: 14# Goal status: 04/02/24 achieved; revised to open jars   2.  Pt. Will improve RUE [facility] skills by 3 sec. to be able to manipulate  small objects. Baseline: 04/02/24: 9 hole peg test: Right: 53 sec, Left: 30 sec 03/03/2024: 9 hole peg test: R side: 1 min 3 sec, L side: 36 sec. 9 hole peg test; R side: 72 secs, L side: 40 secs Goal status: progressing,revised 8/20 for manipulating small objects.  3.  Pt. Will increase R shoulder flex/shoulder ABD ROM by 10 degrees to perform ADLs/IADLs.  Baseline: 04/02/24: Right shoulder flexion:  62(76), Abduction: 52(58) 03/03/2024: R shoulder flex: 61(74), R shoulder ABD: 45(58) R shoulder flex: 50(58), R shoulder ABD: 43(48) Goal status: progressing, ongoing  4.  Pt. Will improve handwriting to 50% legible in cursive form, legibility to be able to send written correspondence Baseline: 03/03/2024: 50% legible in cursive form, Eval: 10% legible in cursive form, 90% legible in printed form. Goal status: Discontinue goal per Pt. report that it has returned to baseline.  5.  Pt. Will improve BUE pinch strength by 2# of force to  be able to open medication bottles Baseline: 04/02/24: Lateral pinch: Right: 8#, Left: 7#, 3 point pinch: Right: 8#, Left: 6# 03/03/2024: Lateral Pinch: R: 6#, L: 5#, 3 pt pinch: R: 6#, L: 3#.  Eval: Lateral Pinch: 7, R 3 pt. Pinch: 4, L Lateral Pinch: 4, L 3 pt. Pinch: 5 Goal status:  04/02/24: Achieved; revised to be able to open medication bottles  6.  Pt. Will increase R wrist extension/flexion by 5 degrees in anticipation of reaching hold of items for ADLs. Baseline: 04/02/24: 52(58) 03/03/2024: R wrist ext: 30 (34), R Wrist flex: 42(48) Eval: R Wrist ext: 20(21), R Wrist flex: 30(31) Goal status: Achieved  ASSESSMENT:  CLINICAL IMPRESSION:  Pt. Tolerated right shoulder exercise well today, however requires support, and assist when attempting functional reaching against gravity 2/2 limitations in ROM, and compensation proximally. Pt. Required  consistent assist, cues, and visual demonstration for motor planning through the hand position needed to formulate 3pt. pinch, and lateral pinch on the resistive clips. Pt. continues to benefit from OT services to work on RUE ROM, RUE strengthening, bilateral grip strength, bilateral pinch strength, bilateral [facility] skills, and Right sided awareness and attention in order to improve functional independence of ADL/IADL tasks at home.   PERFORMANCE DEFICITS: in functional skills including ADLs, IADLs, coordination, dexterity,  proprioception, sensation, tone, ROM, strength, pain, Fine motor control, Gross motor control, endurance, and UE functional use, and psychosocial skills including environmental adaptation, habits, interpersonal interactions, and routines and behaviors.   IMPAIRMENTS: are limiting patient from ADLs, IADLs, rest and sleep, work, leisure, and social participation.   CO-MORBIDITIES: may have co-morbidities  that affects occupational performance. Patient will benefit from skilled OT to address above impairments and improve overall function.  MODIFICATION OR ASSISTANCE TO COMPLETE EVALUATION: Min-Moderate modification of tasks or assist with assess necessary to complete an evaluation.  OT OCCUPATIONAL PROFILE AND HISTORY: Detailed assessment: Review of records and additional review of physical, cognitive, psychosocial history related to current functional performance.  CLINICAL DECISION MAKING: Moderate - several treatment options, min-mod task modification necessary  REHAB POTENTIAL: Good  EVALUATION COMPLEXITY: Moderate    PLAN:  OT FREQUENCY: 1x/week  OT DURATION: 12 weeks  PLANNED INTERVENTIONS: 97535 self care/ADL training, 02889 therapeutic exercise, 97530 therapeutic activity, 97112 neuromuscular re-education, 97018 paraffin, 02989 moist heat, 97010 cryotherapy, 97034 contrast bath, 97032 electrical stimulation (manual), passive range of motion, visual/perceptual remediation/compensation, energy conservation, patient/family education, and DME and/or AE instructions  RECOMMENDED OTHER SERVICES: ST & PT  CONSULTED AND AGREED WITH PLAN OF CARE: Patient and family member/caregiver  PLAN FOR NEXT SESSION: treatment   Shanoah Asbill, MS, OTR/L   04/07/2024, 10:14 PM

## 2024-04-07 NOTE — Therapy (Signed)
 OUTPATIENT SPEECH LANGUAGE PATHOLOGY  COGNITIVE-COMMUNICATION TREATMENT   Patient Name: Haley Jones MRN: [id number] DOB:[birth year], 68 y.o., female Today's Date: 04/07/2024  PCP: Haley Jones FALCON, MD  REFERRING PROVIDER: Pegge Jones PARAS, PA-C    End of Session - 04/07/24 1452     Visit Number 19    Number of Visits 25    Date for SLP Re-Evaluation 04/27/24    Authorization Type [employer]    Authorization Time Period 01/28/2024 thru 04/27/2024    Authorization - Visit Number 19    Authorization - Number of Visits 24    Progress Note Due on Visit 20    SLP Start Time 1315    SLP Stop Time  1400    SLP Time Calculation (min) 45 min    Activity Tolerance Patient tolerated treatment well          Past Medical History:  Diagnosis Date   Adnexal mass    Anxiety    Chronic back pain    Depression    Diabetes mellitus without complication (HCC)    Endometriosis 03/12/2018   GERD (gastroesophageal reflux disease)    Heart murmur 02/2018   undetected until she was an adult. 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   Weight loss 12/06/2017   Past Surgical History:  Procedure Laterality Date   ABDOMINAL HYSTERECTOMY  2008   ovaries also   CESAREAN SECTION  1994   COLONOSCOPY     COLONOSCOPY WITH PROPOFOL  N/A 02/12/2018   Procedure: COLONOSCOPY WITH PROPOFOL ;  Surgeon: Toledo, Ladell POUR, MD;  Location: [facility] ENDOSCOPY;  Service: Gastroenterology;  Laterality: N/A;   ESOPHAGOGASTRODUODENOSCOPY (EGD) WITH PROPOFOL  N/A 02/12/2018   Procedure: ESOPHAGOGASTRODUODENOSCOPY (EGD) WITH PROPOFOL ;  Surgeon: Toledo, Ladell POUR, MD;  Location: [facility] ENDOSCOPY;  Service: Gastroenterology;  Laterality: N/A;   EYE SURGERY Left 1973   muscle shortening to straighten cross eyes   LAPAROSCOPY N/A 02/20/2018   Procedure: LAPAROSCOPY DIAGNOSTIC, ( RESECTION OF RIGHT LOWER QUADRANT ABDOMINAL MASS);   Surgeon: Nicholaus Selinda Birmingham, MD;  Location: [facility] ORS;  Service: General;  Laterality: N/A;   ROTATOR CUFF REPAIR Left 2011   Patient Active Problem List   Diagnosis Date Noted   Cognitive communication deficit 04/07/2024   Aphasia 04/07/2024   Urinary incontinence as sequela of cerebrovascular accident (CVA) 02/11/2024   Depression with anxiety 01/04/2024   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   Acute CVA (cerebrovascular accident) (HCC) 12/20/2023   Primary hypertension 12/20/2023   Acute kidney injury superimposed on chronic kidney disease (HCC) 12/20/2023   DM type 2 with diabetic mixed hyperlipidemia (HCC) 11/17/2022   Major depressive disorder, recurrent, 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/17/2022   B12 deficiency 02/23/2021   SBO (small bowel obstruction) (HCC) 01/29/2019   Barrett's esophagus without dysplasia 04/15/2018   Weight loss 12/06/2017   Hoarse 12/06/2017   Lumbar disc disease 07/26/2017   Diabetes mellitus type 2, controlled, without complications (HCC) 02/09/2015   Surgical menopause 11/26/2014    ONSET DATE: 12/20/23   REFERRING DIAG: CVA  THERAPY DIAG:  Cognitive communication deficit  Aphasia  Rationale for Evaluation and Treatment Rehabilitation  SUBJECTIVE:   PERTINENT HISTORY: [name] is a 68 year old left-handed female who presented to [facility]23 with right side weakness/falls and dizziness. MRI of the brain showed patchy acute cortical/subcortical infarcts within the left parietal occipital lobes individually measuring up to 3 cm (  MCA vascular territory). Subtle petechial hemorrhage associated with some of these infarcts.  No frank hemorrhagic conversion.  Additional punctate left MCA territory cortical acute infarct within the left insula.  Background parenchymal atrophy and chronic small vessel ischemic disease with chronic lacunar infarcts.  CT angio showed severe stenosis/nonocclusive thrombus of the left MCA bifurcation affecting the inferior  division.  Nonocclusive embolus also visible within the left parietal branch as well. Echocardiogram ejection fraction of 60 to 65% no wall motion abnormalities grade 1 diastolic dysfunction. Past medical history also noted for hypertension, B12 deficiency, type 2 diabetes mellitus, hyperlipidemia, CKD stage III, depression.  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 D/c home after CIR admission 12/28/23-01/18/24. Sister lives nearby and checks on patient throughout the day, assists with meals, bathing, and medications.  DIAGNOSTIC FINDINGS: see above  PAIN:  Are you having pain? No   FALLS: Has patient fallen in last 6 months?  Yes, Number of falls: 1 since returning home from rehab, multiple prior to hospitalization  LIVING ENVIRONMENT: Lives with: lives alone Lives in: House/apartment  PLOF:  Level of assistance: Independent with IADLs Employment: Retired   PATIENT GOALS   Pt states she wants to drive again  SUBJECTIVE STATEMENT: Pt alert, cooperative. Pt walked in using her walker today.  Pt accompanied by: self, her sister Haley Jones)  OBJECTIVE:   TODAY'S TREATMENT: Skilled treatment session focused on pt's cognitive communication goals. SLP facilitated session by providing the following interventions:  To alternate attention, pt required maximum assistance to set physical timer on her phone. Constant Therapy Clinician app was used to target problem solving, visual-spatial processing, and impulsivity.   Read everyday things level 1: With mod A, pt achieved 80%; level 2 with mod A, pt achieved 50% - pt benefits from additional support  - therefore recommend increased supervision when attempting novel activities/tasks   PATIENT EDUCATION: Education details: as above Person educated: Patient Education method: Verbal cues Education comprehension: verbalized understanding and needs further  education   HOME EXERCISE PROGRAM:   See above and put list of therapy sessions on August Calendar - new calendar printed  GOALS:  Goals reviewed with patient? Yes  SHORT TERM GOALS: Target date: 10 sessions            Updated 03/05/2024 The patient will sustain attention in simple cognitive linguistic task for 5 minutes given min verbal cues.  Baseline: Goal status: INITIAL: MET: upgraded to With Min A, pt will demonstrate alternating attention between 2 basic tasks in 5 out of 7 opportunities.    2.  Patient will reduce impulsivity by preplanning steps before completing a task with moderate verbal cues. Baseline:  Goal status: INITIAL: MET   3.  Patient will complete Patient Reported Outcome Measure to assess self-perception of deficits.  Baseline:  Goal status: INITIAL: MET   4.  With Min A, patient will complete complex visual-spatial activities with 75% accuracy.               Baseline:               Goal status: INITIAL   5. With Mod I, pt will recall and follow therapy schedule with 80% accuracy.               Baseline:              Goal status: INITIAL  LONG TERM GOALS: Target date: 04/27/2024             Updated 03/05/2024 Patient will alternate attention between two moderately complex tasks 80% acc, with min verbal cues.  Baseline:  Goal status: INITIAL: great progress made   2.  Patient will demonstrate emergent awareness by identifying 75% of errors with min cues for double checking. Baseline:  Goal status: INITIAL: great progress made   3.  Patient will recall safety precautions/mobility recommendations using visual aids if necessary to reduce risk for falls. Baseline:  Goal status: INITIAL: great progress made   ASSESSMENT:  CLINICAL IMPRESSION: Based on recent assessment, Collene Massimino presents with overall moderate cognitive communication impairment per standardized testing using the Cognitive Linguistic Quick Test. Primary deficit areas  (severe) include attention, executive function, and visuospatial skills. Patient's impulsivity contributed significantly to testing errors, as did poor sustained attention to testing instructions. Impulsivity impacting safety at home, contributing to fall at home. Patient and sister report reduced frustration tolerance. She is often frustrated when being given instructions. Patient demonstrated some emergent awareness of errors on tasks such as mazes, but was unaware of perseverations in design generation and generative naming, and errors in clock drawing, symbol cancellation, and symbol trails tasks.   See the above treatment note for details on progress, plan discussed to make next session the final session.   OBJECTIVE IMPAIRMENTS include attention, memory, awareness, and executive functioning. These impairments are limiting patient from managing medications, managing appointments, managing finances, household responsibilities, ADLs/IADLs, and effectively communicating at home and in community. Factors affecting potential to achieve goals and functional outcome are ability to learn/carryover information and severity of impairments. Patient will benefit from skilled SLP services to address above impairments and improve overall function.  REHAB POTENTIAL: Good  PLAN: SLP FREQUENCY: 1-2x/week  SLP DURATION: 12 weeks  PLANNED INTERVENTIONS: Environmental controls, Cueing hierachy, Cognitive reorganization, Internal/external aids, Functional tasks, SLP instruction and feedback, Compensatory strategies, Patient/family education, and 07492 Treatment of speech (30 or 45 min)    Happi B. Rubbie, M.S., CCC-SLP, Tree surgeon Certified Brain Injury Specialist [facility]  [facility] Services Office [phone] Ascom [phone] Fax [phone]

## 2024-04-07 NOTE — Therapy (Signed)
 OUTPATIENT PHYSICAL THERAPY TREATMENT   Patient Name: Haley Jones MRN: [id number] DOB:November 23, 1955, 68 y.o., female Today's Date: 04/07/2024  PCP: Dr. Oneil Pinal REFERRING PROVIDER: Toribio Pitch, PA-C  END OF SESSION:   PT End of Session - 04/07/24 1359     Visit Number 21    Number of Visits 24    Date for PT Re-Evaluation 04/21/24    Authorization Type [employer]    Progress Note Due on Visit 30    PT Start Time 1400    PT Stop Time 1441    PT Time Calculation (min) 41 min    Equipment Utilized During Treatment Gait belt    Activity Tolerance Patient tolerated treatment well    Behavior During Therapy WFL for tasks assessed/performed                  Past Medical History:  Diagnosis Date   Adnexal mass    Anxiety    Chronic back pain    Depression    Diabetes mellitus without complication (HCC)    Endometriosis 03/12/2018   GERD (gastroesophageal reflux disease)    Heart murmur 02/2018   undetected until she was an adult. 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   Weight loss 12/06/2017   Past Surgical History:  Procedure Laterality Date   ABDOMINAL HYSTERECTOMY  2008   ovaries also   CESAREAN SECTION  1994   COLONOSCOPY     COLONOSCOPY WITH PROPOFOL  N/A 02/12/2018   Procedure: COLONOSCOPY WITH PROPOFOL ;  Surgeon: Toledo, Ladell POUR, MD;  Location: [facility];  Service: Gastroenterology;  Laterality: N/A;   ESOPHAGOGASTRODUODENOSCOPY (EGD) WITH PROPOFOL  N/A 02/12/2018   Procedure: ESOPHAGOGASTRODUODENOSCOPY (EGD) WITH PROPOFOL ;  Surgeon: Toledo, Ladell POUR, MD;  Location: [facility];  Service: Gastroenterology;  Laterality: N/A;   EYE SURGERY Left 1973   muscle shortening to straighten cross eyes   LAPAROSCOPY N/A 02/20/2018   Procedure: LAPAROSCOPY DIAGNOSTIC, ( RESECTION OF RIGHT LOWER QUADRANT ABDOMINAL MASS);  Surgeon: Nicholaus Selinda Birmingham, MD;   Location: [facility];  Service: General;  Laterality: N/A;   ROTATOR CUFF REPAIR Left 2011   Patient Active Problem List   Diagnosis Date Noted   Urinary incontinence as sequela of cerebrovascular accident (CVA) 02/11/2024   Depression with anxiety 01/04/2024   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   Acute CVA (cerebrovascular accident) (HCC) 12/20/2023   Primary hypertension 12/20/2023   Acute kidney injury superimposed on chronic kidney disease (HCC) 12/20/2023   DM type 2 with diabetic mixed hyperlipidemia (HCC) 11/17/2022   Major depressive disorder, recurrent, 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  Rationale for Evaluation and Treatment: Rehabilitation  SUBJECTIVE:  SUBJECTIVE STATEMENT:   Denies any falls since last session.  Still has been walking exclusively with her walker when at home due to fear of falling.  Patient reports she is feeling well today.  PERTINENT HISTORY:Pt states she is not sure the exact date of when her CVA happened because she kept falling at home trying to care for herself and her daughter, but she finally went to the hospital when her sister insisted on it. Pt states her special needs daughter, [name], passed away recently while pt was in the hospital at [facility] before she was transferred to [facility] for stroke  rehabilitation.Pt's sister states pt requires supervision/cuing for ADLs  to don clothes correctly, with correct orientation (potential apraxia?) Pt/sister report pt has R side inattention.Pt reports supposedly one of her legs is shorter than the other, believes her R LE might be the shorter one. 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     PAIN:  Are you having pain? No  PRECAUTIONS: Fall RED FLAGS: None  WEIGHT BEARING RESTRICTIONS: No FALLS: Has patient fallen in last 6 months? Yes. Number of falls 6+ before going to hospital and 1 fall since being home from CIR - pt states she was trying to gather her clothes by herself rather than waiting for her sister to get there to help her  LIVING ENVIRONMENT Lives with: lives alone and but pt's sister, [name], comes over daily to assist with ADLs and stays to provide supervision all day - pt states at night she would be able to walk to bathroom using RW if needed (but she wears depends in event of incontinence) Lives in: House/apartment Stairs: she has steps, but she doesn't have to use them (house was wheelchair accessible for her daughter)  Has following equipment at home: Environmental consultant - 2 wheeled, shower chair, Grab bars, Ramped entry, and transport chair  PLOF: Independent, Independent with household mobility without device, Independent with homemaking with ambulation, Independent with gait, Independent with transfers, and she was the primary caregiver for her recently deceased daughter   CLOF: Sister provides supervision daily for pt to ambulate using RW safely and provides assist for ADLs (showering and dressing - progressing towards supervision with these activities); Sister states pt requires cues to turn and step back fully prior to sitting to ensure her safety  PATIENT GOALS: get back to being able to go shopping with improved community level ambulation without the walker; return to driving    OBJECTIVE:  Note: Objective measures were completed at Evaluation unless otherwise noted.  DIAGNOSTIC FINDINGS:   EXAM: MRI HEAD WITHOUT CONTRAST   TECHNIQUE: Multiplanar, multiecho pulse sequences of the brain and surrounding structures were obtained without intravenous contrast.   COMPARISON:  Brain MRI 07/31/2022.  IMPRESSION: 1. Patchy acute cortical/subcortical infarcts within the left parietal and occipital lobes individually measuring up to 3 cm (MCA vascular territory). Subtle petechial hemorrhage associated with some of these infarcts. No frank hemorrhagic conversion. 2. Additional punctate left MCA territory cortical acute infarct within the left insula. 3. Background parenchymal atrophy and chronic small vessel ischemic disease with chronic lacunar infarcts, as described. 4. Paranasal sinus disease as outlined (including severe right maxillary sinusitis).   Electronically Signed   By: Rockey Childs D.O.   On: 12/20/2023 11:52  COGNITION: Overall cognitive status: Within functional limits for tasks assessed   SENSATION: Light touch: initially misses the first 1-2 touches on R LE, but then  able to feel remaining touches Proprioception: Impaired grossly  COORDINATION: Symmetrical heel-to-shin bilaterally  EDEMA:  Not formally assessed, but none observed  MUSCLE TONE: Not formally assessed  POSTURE: rounded shoulders, forward head, and posterior pelvic tilt  LOWER EXTREMITY MMT:    MMT Right Eval Left Eval  Hip flexion 3+, no pain 3+ with some L hip and back pain with this  Hip extension    Hip abduction    Hip adduction    Hip internal rotation    Hip external rotation    Knee flexion 3+ 4  Knee extension 4- 4  Ankle dorsiflexion 3+ 4  Ankle plantarflexion 4- 4  Ankle inversion    Ankle eversion    (Blank rows = not tested)  BED MOBILITY:  Findings: Sit to supine need to assess Supine to sit need to assess *pt reports some difficulty  with bed mobility  TRANSFERS: Sit to stand: SBA  Assistive device utilized: Environmental consultant - 2 wheeled     Stand to sit: SBA  Assistive device utilized: Environmental consultant - 2 wheeled     Chair to chair: SBA  Assistive device utilized: Environmental consultant - 2 wheeled       GAIT: Findings: Gait Characteristics: slight R ankle instability noted, step through pattern, decreased stance time- Right, decreased stride length, and decreased ankle dorsiflexion- Right, Distance walked: ~140ft, Assistive device utilized:Walker - 2 wheeled, Level of assistance: SBA and CGA, and Comments:    FUNCTIONAL TESTS:  5 times sit to stand: 38.75 seconds, using UE support 10 meter walk test: 0.48 m/s using youth RW, requires CGA for safety 6 minute walk test: Visit 2, see note Berg Balance Scale: Visit 2, see note Functional gait assessment: need to assess, when appropriate  PATIENT SURVEYS:  ABC scale 7.5%                                                                                                                             TREATMENT DATE: 04/07/2024  Unless otherwise stated, CGA was provided and gait belt donned in order to ensure pt safety   TA- To improve functional movements patterns for everyday tasks    Gait x 470 ft with 3# AW and RW - cues for faster than comfortable pace  Step up x 10 ea LE with UE assist  Gait x 470 ft with 3#  with RW and cues for faster than comfortable pace  Step up x 10 ea LE with UE assist  Gait x 470 ft with RW with 3# and cues for faster than comfortable pace standing  Steps up / down 4 x 3 rounds - step over step pattern cues for up and step to pattern down  Gait no AD x 150 ft to OT treatment area, no LOB  NMR: To facilitate reeducation of movement, balance, posture, coordination, and/or proprioception/kinesthetic sense.  Side stepping and direction change challenge 6 cones set up in diamond x 2 laps side stepping  to each cone rest then reverse direction for another set of 2   Walking  searching fr cones around clinic x 5-6 min with visual scanning and no AD.    Unless otherwise stated, CGA was provided and gait belt donned in order to ensure pt safety  PATIENT EDUCATION: Education details: PT POC, findings on assessment today, recommendation to continue using RW for all functional mobility Person educated: Patient and Caregiver [name] Education method: Explanation Education comprehension: verbalized understanding and needs further education  HOME EXERCISE PROGRAM: Access Code: [email or web address] URL: [email or web address] Date: 02/04/2024 Prepared by: Peggye Linear  Exercises - Standing March with Counter Support  - 2 x daily - 7 x weekly - 2 sets - 20 reps - Side stepping with counter support: yellow band loop  - 2 x daily - 7 x weekly - 2 sets - 20 reps - Sit to Stand with Counter Support  - 2 x daily - 7 x weekly - 2 sets - 12 reps   GOALS: Goals reviewed with patient? Yes  SHORT TERM GOALS: Target date: 03/10/2024  Pt will be independent with HEP in order to improve strength and balance in order to decrease fall risk and improve function at home and work.  Baseline: need to initiate Goal status:  ONGOING: 7/16: 2-3x week  LONG TERM GOALS: Target date: 04/21/2024  1.  Patient will complete five times sit to stand test in < 15 seconds indicating an increased LE strength and improved balance. Baseline:  38.75 seconds, using UE support, 7/16: 20.35 sec, UE support on 1st STS only 8/20:16.86 sec no UE support  Goal Status: ONGOING   2.  Patient will increase ABC scale score >80% to demonstrate better functional mobility and better confidence with ADLs.   Baseline: 7.5%, 7/16: 18.125% 8/20: 18.75% Goal status: ONGOING   3.  Patient will increase Berg Balance score by > 6 points to demonstrate decreased fall risk during functional activities. Baseline: need to assess; 01/30/2024= 34/56, 7/16= 39 8/20:42 Goal status: MET   4. Patient will increase 10 meter  walk test to >1.70m/s as to improve gait speed for better community ambulation and to reduce fall risk. Baseline: 0.48 m/s using youth RW, requires CGA for safety, 7/16: 0.69 m/s 8/20:  .76 m/s Goal status: ONGOING  5. Patient will increase six minute walk test distance to >1000 for progression to community ambulator and improve gait ability Baseline: need to assess; 01/30/2024=410 feet using RW; 02/13/24: 44ft in 21m30s, 7/16: 693ft using 2WW 8/20: 900 ft with RW  Goal status: ONGOING   ASSESSMENT:  CLINICAL IMPRESSION:    Patient arrived with good motivation for completion of pt activities.  Pt progressed with resisted gait as well as with dynamic gait and balance interventions. Pt has difficulty with coordination and direction change as well weakness with closed chain hip abduction on the R LE. Pt will continue to benefit from skilled physical therapy intervention to address impairments, improve QOL, and attain therapy goals.      OBJECTIVE IMPAIRMENTS: Abnormal gait, decreased activity tolerance, decreased balance, decreased endurance, decreased knowledge of use of DME, decreased mobility, difficulty walking, decreased strength, decreased safety awareness, impaired vision/preception, and pain.   ACTIVITY LIMITATIONS: carrying, lifting, bending, standing, squatting, stairs, transfers, bed mobility, bathing, toileting, dressing, reach over head, hygiene/grooming, and locomotion level  PARTICIPATION LIMITATIONS: meal prep, cleaning, laundry, driving, shopping, and community activity  PERSONAL FACTORS: Age, Time since onset of injury/illness/exacerbation, and 3+ comorbidities: Hypertension, B12 deficiency, type 2 diabetes,  hyperlipidemia, CKD stage IIIa are also affecting patient's functional outcome.   REHAB POTENTIAL: Good  CLINICAL DECISION MAKING: Evolving/moderate complexity  EVALUATION COMPLEXITY: Moderate  PLAN:  PT FREQUENCY: 1-2x/week  PT DURATION: 12 weeks  PLANNED  INTERVENTIONS: [id number]- PT Re-evaluation, 97750- Physical Performance Testing, 97110-Therapeutic exercises, 97530- Therapeutic activity, [id number]- Neuromuscular re-education, 97535- Self Care, [id number]- Manual therapy, [id number]- Gait training, [phone]- Orthotic Initial, [phone]- Orthotic/Prosthetic subsequent, [phone]- Canalith repositioning, [phone]- Electrical stimulation (manual), Patient/Family education, Balance training, Stair training, Taping, Joint mobilization, Spinal mobilization, Vestibular training, Visual/preceptual remediation/compensation, DME instructions, Cryotherapy, Moist heat, and Biofeedback  PLAN FOR NEXT SESSION:   - floor transfers training - continue HIIT - continue gait training without AD for balance and righting (include multi plane, multidirectional)  - dynamic reaching and dynamic stepping balance    Note: Portions of this document were prepared using Dragon voice recognition software and although reviewed may contain unintentional dictation errors in syntax, grammar, or spelling.  Lonni KATHEE Gainer PT ,DPT Physical Therapist- [place]  [facility]    2:00 PM, 04/07/24

## 2024-04-09 ENCOUNTER — Other Ambulatory Visit: Payer: Self-pay | Admitting: Physical Medicine and Rehabilitation

## 2024-04-09 ENCOUNTER — Ambulatory Visit: Admitting: Speech Pathology

## 2024-04-09 ENCOUNTER — Ambulatory Visit: Admitting: Occupational Therapy

## 2024-04-09 ENCOUNTER — Ambulatory Visit: Admitting: Physical Therapy

## 2024-04-09 DIAGNOSIS — R2681 Unsteadiness on feet: Secondary | ICD-10-CM

## 2024-04-09 DIAGNOSIS — M6281 Muscle weakness (generalized): Secondary | ICD-10-CM

## 2024-04-09 DIAGNOSIS — R41841 Cognitive communication deficit: Secondary | ICD-10-CM

## 2024-04-09 DIAGNOSIS — I69351 Hemiplegia and hemiparesis following cerebral infarction affecting right dominant side: Secondary | ICD-10-CM

## 2024-04-09 DIAGNOSIS — R269 Unspecified abnormalities of gait and mobility: Secondary | ICD-10-CM

## 2024-04-09 NOTE — Therapy (Signed)
 OUTPATIENT PHYSICAL THERAPY TREATMENT   Patient Name: Haley Jones MRN: [id number] DOB:Mar 06, 1956, 68 y.o., female Today's Date: 04/09/2024  PCP: Dr. Oneil Pinal REFERRING PROVIDER: Toribio Pitch, PA-C  END OF SESSION:   PT End of Session - 04/09/24 1412     Visit Number 22    Number of Visits 24    Date for PT Re-Evaluation 04/21/24    Authorization Type [employer]    Progress Note Due on Visit 30    PT Start Time 1345    PT Stop Time 1425    PT Time Calculation (min) 40 min    Equipment Utilized During Treatment Gait belt    Activity Tolerance Patient tolerated treatment well    Behavior During Therapy WFL for tasks assessed/performed                  Past Medical History:  Diagnosis Date   Adnexal mass    Anxiety    Chronic back pain    Depression    Diabetes mellitus without complication (HCC)    Endometriosis 03/12/2018   GERD (gastroesophageal reflux disease)    Heart murmur 02/2018   undetected until she was an adult. 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   Weight loss 12/06/2017   Past Surgical History:  Procedure Laterality Date   ABDOMINAL HYSTERECTOMY  2008   ovaries also   CESAREAN SECTION  1994   COLONOSCOPY     COLONOSCOPY WITH PROPOFOL  N/A 02/12/2018   Procedure: COLONOSCOPY WITH PROPOFOL ;  Surgeon: Toledo, Ladell POUR, MD;  Location: [facility];  Service: Gastroenterology;  Laterality: N/A;   ESOPHAGOGASTRODUODENOSCOPY (EGD) WITH PROPOFOL  N/A 02/12/2018   Procedure: ESOPHAGOGASTRODUODENOSCOPY (EGD) WITH PROPOFOL ;  Surgeon: Toledo, Ladell POUR, MD;  Location: [facility];  Service: Gastroenterology;  Laterality: N/A;   EYE SURGERY Left 1973   muscle shortening to straighten cross eyes   LAPAROSCOPY N/A 02/20/2018   Procedure: LAPAROSCOPY DIAGNOSTIC, ( RESECTION OF RIGHT LOWER QUADRANT ABDOMINAL MASS);  Surgeon: Nicholaus Selinda Birmingham, MD;   Location: [facility];  Service: General;  Laterality: N/A;   ROTATOR CUFF REPAIR Left 2011   Patient Active Problem List   Diagnosis Date Noted   Cognitive communication deficit 04/07/2024   Aphasia 04/07/2024   Urinary incontinence as sequela of cerebrovascular accident (CVA) 02/11/2024   Depression with anxiety 01/04/2024   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   Acute CVA (cerebrovascular accident) (HCC) 12/20/2023   Primary hypertension 12/20/2023   Acute kidney injury superimposed on chronic kidney disease (HCC) 12/20/2023   DM type 2 with diabetic mixed hyperlipidemia (HCC) 11/17/2022   Major depressive disorder, recurrent, 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/17/2022   B12 deficiency 02/23/2021   SBO (small bowel obstruction) (HCC) 01/29/2019   Barrett's esophagus without dysplasia 04/15/2018   Weight loss 12/06/2017   Hoarse 12/06/2017   Lumbar disc disease 07/26/2017   Diabetes mellitus type 2, controlled, without complications (HCC) 02/09/2015   Surgical menopause 11/26/2014    ONSET DATE: 12/20/2023  REFERRING DIAG: P36.487 (ICD-10-CM) - Left middle cerebral artery stroke (HCC)   THERAPY DIAG:  Muscle weakness (generalized)  Unsteadiness on feet  Abnormality of gait  Hemiplegia and hemiparesis following cerebral infarction affecting right dominant side (HCC)  Rationale for Evaluation and Treatment: Rehabilitation  SUBJECTIVE:  SUBJECTIVE STATEMENT:   Denies any falls since last session.  Still has been walking exclusively with her walker when at home due to fear of falling.  Patient reports she is feeling well today.  PERTINENT HISTORY:Pt states she is not sure the exact date of when her CVA happened because she kept falling at home trying to care for herself and her daughter, but she finally  went to the hospital when her sister insisted on it. Pt states her special needs daughter, [name], passed away recently while pt was in the hospital at [facility] before she was transferred to [facility] for stroke rehabilitation.Pt's sister states pt requires supervision/cuing for ADLs  to don clothes correctly, with correct orientation (potential apraxia?) Pt/sister report pt has R side inattention.Pt reports supposedly one of her legs is shorter than the other, believes her R LE might be the shorter one. 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     PAIN:  Are you having pain? No  PRECAUTIONS: Fall RED FLAGS: None  WEIGHT BEARING RESTRICTIONS: No FALLS: Has patient fallen in last 6 months? Yes. Number of falls 6+ before going to hospital and 1 fall since being home from CIR - pt states she was trying to gather her clothes by herself rather than waiting for her sister to get there to help her  LIVING ENVIRONMENT Lives with: lives alone and but pt's sister, [name], comes over daily to assist with ADLs and stays to provide supervision all day - pt states at night she would be able to walk to bathroom using RW if needed (but she wears depends in event of incontinence) Lives in: House/apartment Stairs: she has steps, but she doesn't have to use them (house was wheelchair accessible for her daughter)  Has following equipment at home: Environmental consultant - 2 wheeled, shower chair, Grab bars, Ramped entry, and transport chair  PLOF: Independent, Independent with household mobility without device, Independent with homemaking with ambulation, Independent with gait, Independent with transfers, and she was the primary caregiver for her recently deceased daughter   CLOF: Sister provides supervision daily for pt to ambulate using RW safely and provides assist for ADLs (showering and dressing - progressing towards supervision with these activities); Sister states pt  requires cues to turn and step back fully prior to sitting to ensure her safety  PATIENT GOALS: get back to being able to go shopping with improved community level ambulation without the walker; return to driving   OBJECTIVE:  Note: Objective measures were completed at Evaluation unless otherwise noted.  DIAGNOSTIC FINDINGS:   EXAM: MRI HEAD WITHOUT CONTRAST   TECHNIQUE: Multiplanar, multiecho pulse sequences of the brain and surrounding structures were obtained without intravenous contrast.   COMPARISON:  Brain MRI 07/31/2022.  IMPRESSION: 1. Patchy acute cortical/subcortical infarcts within the left parietal and occipital lobes individually measuring up to 3 cm (MCA vascular territory). Subtle petechial hemorrhage associated with some of these infarcts. No frank hemorrhagic conversion. 2. Additional punctate left MCA territory cortical acute infarct within the left insula. 3. Background parenchymal atrophy and chronic small vessel ischemic disease with chronic lacunar infarcts, as described. 4. Paranasal sinus disease as outlined (including severe right maxillary sinusitis).   Electronically Signed   By: Rockey Childs D.O.   On: 12/20/2023 11:52  COGNITION: Overall cognitive status: Within functional limits for tasks assessed   SENSATION: Light touch: initially misses the first 1-2 touches on R LE, but then  able to feel remaining touches Proprioception: Impaired grossly  COORDINATION: Symmetrical heel-to-shin bilaterally  EDEMA:  Not formally assessed, but none observed  MUSCLE TONE: Not formally assessed  POSTURE: rounded shoulders, forward head, and posterior pelvic tilt  LOWER EXTREMITY MMT:    MMT Right Eval Left Eval  Hip flexion 3+, no pain 3+ with some L hip and back pain with this  Hip extension    Hip abduction    Hip adduction    Hip internal rotation    Hip external rotation    Knee flexion 3+ 4  Knee extension 4- 4  Ankle dorsiflexion 3+ 4   Ankle plantarflexion 4- 4  Ankle inversion    Ankle eversion    (Blank rows = not tested)  BED MOBILITY:  Findings: Sit to supine need to assess Supine to sit need to assess *pt reports some difficulty with bed mobility  TRANSFERS: Sit to stand: SBA  Assistive device utilized: Environmental consultant - 2 wheeled     Stand to sit: SBA  Assistive device utilized: Environmental consultant - 2 wheeled     Chair to chair: SBA  Assistive device utilized: Environmental consultant - 2 wheeled       GAIT: Findings: Gait Characteristics: slight R ankle instability noted, step through pattern, decreased stance time- Right, decreased stride length, and decreased ankle dorsiflexion- Right, Distance walked: ~117ft, Assistive device utilized:Walker - 2 wheeled, Level of assistance: SBA and CGA, and Comments:    FUNCTIONAL TESTS:  5 times sit to stand: 38.75 seconds, using UE support 10 meter walk test: 0.48 m/s using youth RW, requires CGA for safety 6 minute walk test: Visit 2, see note Berg Balance Scale: Visit 2, see note Functional gait assessment: need to assess, when appropriate  PATIENT SURVEYS:  ABC scale 7.5%                                                                                                                             TREATMENT DATE: 04/09/2024  Unless otherwise stated, CGA was provided and gait belt donned in order to ensure pt safety   TA- To improve functional movements patterns for everyday tasks    Gait x 470 ft with 3# AW and RW - cues for faster than comfortable pace  Step up x 12 ea LE with UE assist  Gait x 800 ft with 3#  with RW and cues for faster than comfortable pace, rest then STS from rocking chair and ambulates through doorw and threshold and back to clinic with RW at increased speed.   STS x 12 with feet on airex pad   NMR: To facilitate reeducation of movement, balance, posture, coordination, and/or proprioception/kinesthetic sense.  Forward and retro gait 2 x 15 ft ea direction no AD   Walking  searching for cones around clinic x 6 min with visual scanning and no AD.   Unless otherwise stated, CGA was provided and gait belt donned in order to ensure pt  safety  PATIENT EDUCATION: Education details: PT POC, findings on assessment today, recommendation to continue using RW for all functional mobility Person educated: Patient and Caregiver [name] Education method: Explanation Education comprehension: verbalized understanding and needs further education  HOME EXERCISE PROGRAM: Access Code: [email or web address] URL: [email or web address] Date: 02/04/2024 Prepared by: Peggye Linear  Exercises - Standing March with Counter Support  - 2 x daily - 7 x weekly - 2 sets - 20 reps - Side stepping with counter support: yellow band loop  - 2 x daily - 7 x weekly - 2 sets - 20 reps - Sit to Stand with Counter Support  - 2 x daily - 7 x weekly - 2 sets - 12 reps   GOALS: Goals reviewed with patient? Yes  SHORT TERM GOALS: Target date: 03/10/2024  Pt will be independent with HEP in order to improve strength and balance in order to decrease fall risk and improve function at home and work.  Baseline: need to initiate Goal status:  ONGOING: 7/16: 2-3x week  LONG TERM GOALS: Target date: 04/21/2024  1.  Patient will complete five times sit to stand test in < 15 seconds indicating an increased LE strength and improved balance. Baseline:  38.75 seconds, using UE support, 7/16: 20.35 sec, UE support on 1st STS only 8/20:16.86 sec no UE support  Goal Status: ONGOING   2.  Patient will increase ABC scale score >80% to demonstrate better functional mobility and better confidence with ADLs.   Baseline: 7.5%, 7/16: 18.125% 8/20: 18.75% Goal status: ONGOING   3.  Patient will increase Berg Balance score by > 6 points to demonstrate decreased fall risk during functional activities. Baseline: need to assess; 01/30/2024= 34/56, 7/16= 39 8/20:42 Goal status: MET   4. Patient will increase 10 meter  walk test to >1.86m/s as to improve gait speed for better community ambulation and to reduce fall risk. Baseline: 0.48 m/s using youth RW, requires CGA for safety, 7/16: 0.69 m/s 8/20:  .76 m/s Goal status: ONGOING  5. Patient will increase six minute walk test distance to >1000 for progression to community ambulator and improve gait ability Baseline: need to assess; 01/30/2024=410 feet using RW; 02/13/24: 461ft in 66m30s, 7/16: 666ft using 2WW 8/20: 900 ft with RW  Goal status: ONGOING   ASSESSMENT:  CLINICAL IMPRESSION:    Patient arrived with good motivation for completion of pt activities.  Pt progressed with resisted gait as well as with dynamic gait and balance interventions. Pt has difficulty with coordination and direction change as well difficulty with directions when ambulating. Plan to challenge pt direction with walking to work on awareness in community settings.  Pt will continue to benefit from skilled physical therapy intervention to address impairments, improve QOL, and attain therapy goals.      OBJECTIVE IMPAIRMENTS: Abnormal gait, decreased activity tolerance, decreased balance, decreased endurance, decreased knowledge of use of DME, decreased mobility, difficulty walking, decreased strength, decreased safety awareness, impaired vision/preception, and pain.   ACTIVITY LIMITATIONS: carrying, lifting, bending, standing, squatting, stairs, transfers, bed mobility, bathing, toileting, dressing, reach over head, hygiene/grooming, and locomotion level  PARTICIPATION LIMITATIONS: meal prep, cleaning, laundry, driving, shopping, and community activity  PERSONAL FACTORS: Age, Time since onset of injury/illness/exacerbation, and 3+ comorbidities: Hypertension, B12 deficiency, type 2 diabetes, hyperlipidemia, CKD stage IIIa are also affecting patient's functional outcome.   REHAB POTENTIAL: Good  CLINICAL DECISION MAKING: Evolving/moderate complexity  EVALUATION COMPLEXITY:  Moderate  PLAN:  PT FREQUENCY: 1-2x/week  PT DURATION: 12 weeks  PLANNED INTERVENTIONS: [id number]- PT Re-evaluation, 97750- Physical Performance Testing, 97110-Therapeutic exercises, 97530- Therapeutic activity, [id number]- Neuromuscular re-education, [phone]- Self Care, [id number]- Manual therapy, [phone]- Gait training, [phone]- Orthotic Initial, [phone]- Orthotic/Prosthetic subsequent, [phone]- Canalith repositioning, [phone]- Electrical stimulation (manual), Patient/Family education, Balance training, Stair training, Taping, Joint mobilization, Spinal mobilization, Vestibular training, Visual/preceptual remediation/compensation, DME instructions, Cryotherapy, Moist heat, and Biofeedback  PLAN FOR NEXT SESSION:   - floor transfers training - continue HIIT - continue gait training without AD for balance and righting (include multi plane, multidirectional)  - dynamic reaching and dynamic stepping balance    Note: Portions of this document were prepared using Dragon voice recognition software and although reviewed may contain unintentional dictation errors in syntax, grammar, or spelling.  Lonni KATHEE Gainer PT ,DPT Physical Therapist- [place]  [facility]    2:13 PM, 04/09/24

## 2024-04-09 NOTE — Therapy (Signed)
 OUTPATIENT SPEECH LANGUAGE PATHOLOGY  COGNITIVE-COMMUNICATION TREATMENT DISCHARGE SUMMARY   Patient Name: Haley Jones MRN: [id number] DOB:11-05-1955, 68 y.o., female Today's Date: 04/09/2024  PCP: Cleotilde Oneil FALCON, MD  REFERRING PROVIDER: Pegge Toribio PARAS, PA-C    End of Session - 04/09/24 1317     Visit Number 20    Number of Visits 25    Date for SLP Re-Evaluation 04/27/24    Authorization Type [employer]    Authorization Time Period 01/28/2024 thru 04/27/2024    Authorization - Visit Number 20    Authorization - Number of Visits 24    Progress Note Due on Visit 20    SLP Start Time 1315    SLP Stop Time  1345    SLP Time Calculation (min) 30 min    Activity Tolerance Patient tolerated treatment well          Past Medical History:  Diagnosis Date   Adnexal mass    Anxiety    Chronic back pain    Depression    Diabetes mellitus without complication (HCC)    Endometriosis 03/12/2018   GERD (gastroesophageal reflux disease)    Heart murmur 02/2018   undetected until she was an adult. 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   Weight loss 12/06/2017   Past Surgical History:  Procedure Laterality Date   ABDOMINAL HYSTERECTOMY  2008   ovaries also   CESAREAN SECTION  1994   COLONOSCOPY     COLONOSCOPY WITH PROPOFOL  N/A 02/12/2018   Procedure: COLONOSCOPY WITH PROPOFOL ;  Surgeon: Toledo, Ladell POUR, MD;  Location: [facility];  Service: Gastroenterology;  Laterality: N/A;   ESOPHAGOGASTRODUODENOSCOPY (EGD) WITH PROPOFOL  N/A 02/12/2018   Procedure: ESOPHAGOGASTRODUODENOSCOPY (EGD) WITH PROPOFOL ;  Surgeon: Toledo, Ladell POUR, MD;  Location: [facility];  Service: Gastroenterology;  Laterality: N/A;   EYE SURGERY Left 1973   muscle shortening to straighten cross eyes   LAPAROSCOPY N/A 02/20/2018   Procedure: LAPAROSCOPY DIAGNOSTIC, ( RESECTION OF RIGHT LOWER QUADRANT  ABDOMINAL MASS);  Surgeon: Nicholaus Selinda Birmingham, MD;  Location: [facility] OR[facility];  Service: General;  Laterality: N/A;   ROTATOR CUFF REPAIR Left 2011   Patient Active Problem List   Diagnosis Date Noted   Cognitive communication deficit 04/07/2024   Aphasia 04/07/2024   Urinary incontinence as sequela of cerebrovascular accident (CVA) 02/11/2024   Depression with anxiety 01/04/2024   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   Acute CVA (cerebrovascular accident) (HCC) 12/20/2023   Primary hypertension 12/20/2023   Acute kidney injury superimposed on chronic kidney disease (HCC) 12/20/2023   DM type 2 with diabetic mixed hyperlipidemia (HCC) 11/17/2022   Major depressive disorder, recurrent, 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/17/2022   B12 deficiency 02/23/2021   SBO (small bowel obstruction) (HCC) 01/29/2019   Barrett's esophagus without dysplasia 04/15/2018   Weight loss 12/06/2017   Hoarse 12/06/2017   Lumbar disc disease 07/26/2017   Diabetes mellitus type 2, controlled, without complications (HCC) 02/09/2015   Surgical menopause 11/26/2014    ONSET DATE: 12/20/23   REFERRING DIAG: CVA  THERAPY DIAG:  Cognitive communication deficit  Rationale for Evaluation and Treatment Rehabilitation  SUBJECTIVE:   PERTINENT HISTORY: Haley K. First is a 67 year old left-handed female who presented to [facility] on 12/20/23 with right side weakness/falls and dizziness. MRI of the brain showed patchy acute cortical/subcortical infarcts within the left parietal occipital lobes individually measuring up to 3 cm (  MCA vascular territory). Subtle petechial hemorrhage associated with some of these infarcts.  No frank hemorrhagic conversion.  Additional punctate left MCA territory cortical acute infarct within the left insula.  Background parenchymal atrophy and chronic small vessel ischemic disease with chronic lacunar infarcts.  CT angio showed severe stenosis/nonocclusive thrombus of the left MCA bifurcation affecting the  inferior division.  Nonocclusive embolus also visible within the left parietal branch as well. Echocardiogram ejection fraction of 60 to 65% no wall motion abnormalities grade 1 diastolic dysfunction. Past medical history also noted for hypertension, B12 deficiency, type 2 diabetes mellitus, hyperlipidemia, CKD stage III, depression.  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 D/c home after CIR admission 12/28/23-01/18/24. Sister lives nearby and checks on patient throughout the day, assists with meals, bathing, and medications.  DIAGNOSTIC FINDINGS: see above  PAIN:  Are you having pain? No   FALLS: Has patient fallen in last 6 months?  Yes, Number of falls: 1 since returning home from rehab, multiple prior to hospitalization  LIVING ENVIRONMENT: Lives with: lives alone Lives in: House/apartment  PLOF:  Level of assistance: Independent with IADLs Employment: Retired   PATIENT GOALS   Pt states she wants to drive again  SUBJECTIVE STATEMENT: Pt alert, cooperative. States that she walked herself all the way in (her sister usually walks with her down to rehab reception area) Pt accompanied by: self, her sister Elray)  OBJECTIVE:   TODAY'S TREATMENT: Skilled treatment session focused on pt's cognitive communication goals. SLP facilitated session by providing the following interventions:  Pt continues to report return to cognitive communication baseline which is described as ability to perform basic to semi-complex problem solving targeting basic ADLs.   Continue to recommend follow up with driving school prior to return to driving.   PATIENT EDUCATION: Education details: as above Person educated: Patient Education method: Verbal cues Education comprehension: verbalized understanding and needs further education   HOME EXERCISE PROGRAM:   See above and put list of therapy sessions on August Calendar -  new calendar printed  GOALS:  Goals reviewed with patient? Yes  SHORT TERM GOALS: Target date: 10 sessions            Updated 03/05/2024  Updated 04/09/2024 The patient will sustain attention in simple cognitive linguistic task for 5 minutes given min verbal cues.  Baseline: Goal status: INITIAL: MET: upgraded to With Min A, pt will demonstrate alternating attention between 2 basic tasks in 5 out of 7 opportunities: MET   2.  Patient will reduce impulsivity by preplanning steps before completing a task with moderate verbal cues. Baseline:  Goal status: INITIAL: MET   3.  Patient will complete Patient Reported Outcome Measure to assess self-perception of deficits.  Baseline:  Goal status: INITIAL: MET   4.  With Min A, patient will complete complex visual-spatial activities with 75% accuracy.               Baseline:               Goal status: INITIAL: MET   5. With Mod I, pt will recall and follow therapy schedule with 80% accuracy.               Baseline:              Goal status: INITIAL: MET         LONG TERM GOALS: Target date: 04/27/2024  Updated 03/05/2024  Updated 04/09/2024 Patient will alternate attention between two moderately complex tasks 80% acc, with min verbal cues.  Baseline:  Goal status: INITIAL: great progress made: currently at baseline   2.  Patient will demonstrate emergent awareness by identifying 75% of errors with min cues for double checking. Baseline:  Goal status: INITIAL: great progress made: currently at baseline    3.  Patient will recall safety precautions/mobility recommendations using visual aids if necessary to reduce risk for falls. Baseline:  Goal status: INITIAL: great progress made: currently at baseline   ASSESSMENT:  CLINICAL IMPRESSION: Pt has made great progress throughout skilled ST sessions. As a result, her cognitive communication abilities are considered to be at baseline. Therefore skilled ST services are no  longer indicated.   See the above treatment note for details on progress.   PLAN: Pt currently at baseline.    Happi B. Rubbie, M.S., CCC-SLP, Tree surgeon Certified Brain Injury Specialist [facility]  [facility] Services Office [phone] Ascom [phone] Fax [phone]

## 2024-04-16 ENCOUNTER — Ambulatory Visit: Admitting: Speech Pathology

## 2024-04-16 ENCOUNTER — Ambulatory Visit: Attending: Physician Assistant | Admitting: Physical Therapy

## 2024-04-16 ENCOUNTER — Ambulatory Visit: Admitting: Occupational Therapy

## 2024-04-16 DIAGNOSIS — R269 Unspecified abnormalities of gait and mobility: Secondary | ICD-10-CM | POA: Diagnosis present

## 2024-04-16 DIAGNOSIS — H543 Unqualified visual loss, both eyes: Secondary | ICD-10-CM | POA: Diagnosis present

## 2024-04-16 DIAGNOSIS — M6281 Muscle weakness (generalized): Secondary | ICD-10-CM

## 2024-04-16 DIAGNOSIS — R2681 Unsteadiness on feet: Secondary | ICD-10-CM | POA: Insufficient documentation

## 2024-04-16 DIAGNOSIS — R278 Other lack of coordination: Secondary | ICD-10-CM | POA: Insufficient documentation

## 2024-04-16 DIAGNOSIS — I69351 Hemiplegia and hemiparesis following cerebral infarction affecting right dominant side: Secondary | ICD-10-CM | POA: Diagnosis present

## 2024-04-16 NOTE — Therapy (Signed)
 OUTPATIENT PHYSICAL THERAPY TREATMENT   Patient Name: Haley Jones MRN: [id number] DOB:[birth year], 68 y.o., female Today's Date: 04/16/2024  PCP: Dr. Oneil Pinal REFERRING PROVIDER: Toribio Pitch, PA-C  END OF SESSION:   PT End of Session - 04/16/24 1352     Visit Number 23    Number of Visits 24    Date for PT Re-Evaluation 04/21/24    Authorization Type [employer]    Progress Note Due on Visit 30    PT Start Time 1403    PT Stop Time 1444    PT Time Calculation (min) 41 min    Equipment Utilized During Treatment Gait belt    Activity Tolerance Patient tolerated treatment well    Behavior During Therapy WFL for tasks assessed/performed                  Past Medical History:  Diagnosis Date   Adnexal mass    Anxiety    Chronic back pain    Depression    Diabetes mellitus without complication (HCC)    Endometriosis 03/12/2018   GERD (gastroesophageal reflux disease)    Heart murmur 02/2018   undetected until she was an adult. 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   Weight loss 12/06/2017   Past Surgical History:  Procedure Laterality Date   ABDOMINAL HYSTERECTOMY  2008   ovaries also   CESAREAN SECTION  1994   COLONOSCOPY     COLONOSCOPY WITH PROPOFOL  N/A 02/12/2018   Procedure: COLONOSCOPY WITH PROPOFOL ;  Surgeon: Toledo, Ladell POUR, MD;  Location: [facility];  Service: Gastroenterology;  Laterality: N/A;   ESOPHAGOGASTRODUODENOSCOPY (EGD) WITH PROPOFOL  N/A 02/12/2018   Procedure: ESOPHAGOGASTRODUODENOSCOPY (EGD) WITH PROPOFOL ;  Surgeon: Toledo, Ladell POUR, MD;  Location: [facility];  Service: Gastroenterology;  Laterality: N/A;   EYE SURGERY Left 1973   muscle shortening to straighten cross eyes   LAPAROSCOPY N/A 02/20/2018   Procedure: LAPAROSCOPY DIAGNOSTIC, ( RESECTION OF RIGHT LOWER QUADRANT ABDOMINAL MASS);  Surgeon: Nicholaus Selinda Birmingham, MD;   Location: [facility];  Service: General;  Laterality: N/A;   ROTATOR CUFF REPAIR Left 2011   Patient Active Problem List   Diagnosis Date Noted   Cognitive communication deficit 04/07/2024   Aphasia 04/07/2024   Urinary incontinence as sequela of cerebrovascular accident (CVA) 02/11/2024   Depression with anxiety 01/04/2024   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   Acute CVA (cerebrovascular accident) (HCC) 12/20/2023   Primary hypertension 12/20/2023   Acute kidney injury superimposed on chronic kidney disease (HCC) 12/20/2023   DM type 2 with diabetic mixed hyperlipidemia (HCC) 11/17/2022   Major depressive disorder, recurrent, 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/17/2022   B12 deficiency 02/23/2021   SBO (small bowel obstruction) (HCC) 01/29/2019   Barrett's esophagus without dysplasia 04/15/2018   Weight loss 12/06/2017   Hoarse 12/06/2017   Lumbar disc disease 07/26/2017   Diabetes mellitus type 2, controlled, without complications (HCC) 02/09/2015   Surgical menopause 11/26/2014    ONSET DATE: 12/20/2023  REFERRING DIAG: P36.487 (ICD-10-CM) - Left middle cerebral artery stroke (HCC)   THERAPY DIAG:  Muscle weakness (generalized)  Unsteadiness on feet  Abnormality of gait  Hemiplegia and hemiparesis following cerebral infarction affecting right dominant side (HCC)  Rationale for Evaluation and Treatment: Rehabilitation  SUBJECTIVE:  SUBJECTIVE STATEMENT:   Denies any falls since last session.  Still has been walking exclusively with her walker when at home due to fear of falling.  Patient reports she is feeling well today.  PERTINENT HISTORY:Pt states she is not sure the exact date of when her CVA happened because she kept falling at home trying to care for herself and her daughter, but she finally  went to the hospital when her sister insisted on it. Pt states her special needs daughter, [name], passed away recently while pt was in the hospital at [facility] before she was transferred to [facility] for stroke rehabilitation.Pt's sister states pt requires supervision/cuing for ADLs  to don clothes correctly, with correct orientation (potential apraxia?) Pt/sister report pt has R side inattention.Pt reports supposedly one of her legs is shorter than the other, believes her R LE might be the shorter one. 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     PAIN:  Are you having pain? No  PRECAUTIONS: Fall RED FLAGS: None  WEIGHT BEARING RESTRICTIONS: No FALLS: Has patient fallen in last 6 months? Yes. Number of falls 6+ before going to hospital and 1 fall since being home from CIR - pt states she was trying to gather her clothes by herself rather than waiting for her sister to get there to help her  LIVING ENVIRONMENT Lives with: lives alone and but pt's sister, [name], comes over daily to assist with ADLs and stays to provide supervision all day - pt states at night she would be able to walk to bathroom using RW if needed (but she wears depends in event of incontinence) Lives in: House/apartment Stairs: she has steps, but she doesn't have to use them (house was wheelchair accessible for her daughter)  Has following equipment at home: Environmental consultant - 2 wheeled, shower chair, Grab bars, Ramped entry, and transport chair  PLOF: Independent, Independent with household mobility without device, Independent with homemaking with ambulation, Independent with gait, Independent with transfers, and she was the primary caregiver for her recently deceased daughter   CLOF: Sister provides supervision daily for pt to ambulate using RW safely and provides assist for ADLs (showering and dressing - progressing towards supervision with these activities); Sister states pt  requires cues to turn and step back fully prior to sitting to ensure her safety  PATIENT GOALS: get back to being able to go shopping with improved community level ambulation without the walker; return to driving   OBJECTIVE:  Note: Objective measures were completed at Evaluation unless otherwise noted.  DIAGNOSTIC FINDINGS:   EXAM: MRI HEAD WITHOUT CONTRAST   TECHNIQUE: Multiplanar, multiecho pulse sequences of the brain and surrounding structures were obtained without intravenous contrast.   COMPARISON:  Brain MRI 07/31/2022.  IMPRESSION: 1. Patchy acute cortical/subcortical infarcts within the left parietal and occipital lobes individually measuring up to 3 cm (MCA vascular territory). Subtle petechial hemorrhage associated with some of these infarcts. No frank hemorrhagic conversion. 2. Additional punctate left MCA territory cortical acute infarct within the left insula. 3. Background parenchymal atrophy and chronic small vessel ischemic disease with chronic lacunar infarcts, as described. 4. Paranasal sinus disease as outlined (including severe right maxillary sinusitis).   Electronically Signed   By: Rockey Childs D.O.   On: 12/20/2023 11:52  COGNITION: Overall cognitive status: Within functional limits for tasks assessed   SENSATION: Light touch: initially misses the first 1-2 touches on R LE, but then  able to feel remaining touches Proprioception: Impaired grossly  COORDINATION: Symmetrical heel-to-shin bilaterally  EDEMA:  Not formally assessed, but none observed  MUSCLE TONE: Not formally assessed  POSTURE: rounded shoulders, forward head, and posterior pelvic tilt  LOWER EXTREMITY MMT:    MMT Right Eval Left Eval  Hip flexion 3+, no pain 3+ with some L hip and back pain with this  Hip extension    Hip abduction    Hip adduction    Hip internal rotation    Hip external rotation    Knee flexion 3+ 4  Knee extension 4- 4  Ankle dorsiflexion 3+ 4   Ankle plantarflexion 4- 4  Ankle inversion    Ankle eversion    (Blank rows = not tested)  BED MOBILITY:  Findings: Sit to supine need to assess Supine to sit need to assess *pt reports some difficulty with bed mobility  TRANSFERS: Sit to stand: SBA  Assistive device utilized: Environmental consultant - 2 wheeled     Stand to sit: SBA  Assistive device utilized: Environmental consultant - 2 wheeled     Chair to chair: SBA  Assistive device utilized: Environmental consultant - 2 wheeled       GAIT: Findings: Gait Characteristics: slight R ankle instability noted, step through pattern, decreased stance time- Right, decreased stride length, and decreased ankle dorsiflexion- Right, Distance walked: ~120ft, Assistive device utilized:Walker - 2 wheeled, Level of assistance: SBA and CGA, and Comments:    FUNCTIONAL TESTS:  5 times sit to stand: 38.75 seconds, using UE support 10 meter walk test: 0.48 m/s using youth RW, requires CGA for safety 6 minute walk test: Visit 2, see note Berg Balance Scale: Visit 2, see note Functional gait assessment: need to assess, when appropriate  PATIENT SURVEYS:  ABC scale 7.5%                                                                                                                             TREATMENT DATE: 04/16/2024  Unless otherwise stated, CGA was provided and gait belt donned in order to ensure pt safety   TA- To improve functional movements patterns for everyday tasks    Gait x 1000 ft with 3# AW and RW - cues for faster than comfortable pace, also have pt pay close attention to where she walks as we walk through various parts of hospital and healing garden, sit in outdoor swing to rest then has to navigate back another 1000 ft.  2 error with getting back ( wrong turn or missed a turn)   Step up 2 x 15 ea LE with UE assist    NMR: To facilitate reeducation of movement, balance, posture, coordination, and/or proprioception/kinesthetic sense.  Gait with R turn practice with cues for  scanning R side prior to turning to prevent LOB  Gait no AD weaving between clinic obstacles x 4 min with various turns, 1 LOB requiring min A for recovery with sharp R turn then  pt does not scan environment prior to turning   Unless otherwise stated, CGA was provided and gait belt donned in order to ensure pt safety  PATIENT EDUCATION: Education details: PT POC, findings on assessment today, recommendation to continue using RW for all functional mobility Person educated: Patient and Caregiver [name] Education method: Explanation Education comprehension: verbalized understanding and needs further education  HOME EXERCISE PROGRAM: Access Code: [email or web address] URL: [email or web address] Date: 02/04/2024 Prepared by: Peggye Linear  Exercises - Standing March with Counter Support  - 2 x daily - 7 x weekly - 2 sets - 20 reps - Side stepping with counter support: yellow band loop  - 2 x daily - 7 x weekly - 2 sets - 20 reps - Sit to Stand with Counter Support  - 2 x daily - 7 x weekly - 2 sets - 12 reps   GOALS: Goals reviewed with patient? Yes  SHORT TERM GOALS: Target date: 03/10/2024  Pt will be independent with HEP in order to improve strength and balance in order to decrease fall risk and improve function at home and work.  Baseline: need to initiate Goal status:  ONGOING: 7/16: 2-3x week  LONG TERM GOALS: Target date: 04/21/2024  1.  Patient will complete five times sit to stand test in < 15 seconds indicating an increased LE strength and improved balance. Baseline:  38.75 seconds, using UE support, 7/16: 20.35 sec, UE support on 1st STS only 8/20:16.86 sec no UE support  Goal Status: ONGOING   2.  Patient will increase ABC scale score >80% to demonstrate better functional mobility and better confidence with ADLs.   Baseline: 7.5%, 7/16: 18.125% 8/20: 18.75% Goal status: ONGOING   3.  Patient will increase Berg Balance score by > 6 points to demonstrate decreased fall  risk during functional activities. Baseline: need to assess; 01/30/2024= 34/56, 7/16= 39 8/20:42 Goal status: MET   4. Patient will increase 10 meter walk test to >1.34m/s as to improve gait speed for better community ambulation and to reduce fall risk. Baseline: 0.48 m/s using youth RW, requires CGA for safety, 7/16: 0.69 m/s 8/20:  .76 m/s Goal status: ONGOING  5. Patient will increase six minute walk test distance to >1000 for progression to community ambulator and improve gait ability Baseline: need to assess; 01/30/2024=410 feet using RW; 02/13/24: 459ft in 22m30s, 7/16: 65ft using 2WW 8/20: 900 ft with RW  Goal status: ONGOING   ASSESSMENT:  CLINICAL IMPRESSION:    Patient arrived with good motivation for completion of pt activities.  Pt progressed with resisted gait as well as with dynamic gait and balance interventions. Pt has difficulty with coordination and direction change as well difficulty with directions and getting back to start point when ambulating which could lead to difficulty with driving or with other activities. Plan to challenge pt direction with walking to work on awareness in community settings.  Pt will continue to benefit from skilled physical therapy intervention to address impairments, improve QOL, and attain therapy goals.   OBJECTIVE IMPAIRMENTS: Abnormal gait, decreased activity tolerance, decreased balance, decreased endurance, decreased knowledge of use of DME, decreased mobility, difficulty walking, decreased strength, decreased safety awareness, impaired vision/preception, and pain.   ACTIVITY LIMITATIONS: carrying, lifting, bending, standing, squatting, stairs, transfers, bed mobility, bathing, toileting, dressing, reach over head, hygiene/grooming, and locomotion level  PARTICIPATION LIMITATIONS: meal prep, cleaning, laundry, driving, shopping, and community activity  PERSONAL FACTORS: Age, Time since onset of injury/illness/exacerbation, and 3+ comorbidities:  Hypertension, B12 deficiency, type  2 diabetes, hyperlipidemia, CKD stage IIIa are also affecting patient's functional outcome.   REHAB POTENTIAL: Good  CLINICAL DECISION MAKING: Evolving/moderate complexity  EVALUATION COMPLEXITY: Moderate  PLAN:  PT FREQUENCY: 1-2x/week  PT DURATION: 12 weeks  PLANNED INTERVENTIONS: [id number]- PT Re-evaluation, 97750- Physical Performance Testing, 97110-Therapeutic exercises, 97530- Therapeutic activity, [id number]- Neuromuscular re-education, 97535- Self Care, [id number]- Manual therapy, [id number]- Gait training, [phone]- Orthotic Initial, [phone]- Orthotic/Prosthetic subsequent, [phone]- Canalith repositioning, [phone]- Electrical stimulation (manual), Patient/Family education, Balance training, Stair training, Taping, Joint mobilization, Spinal mobilization, Vestibular training, Visual/preceptual remediation/compensation, DME instructions, Cryotherapy, Moist heat, and Biofeedback  PLAN FOR NEXT SESSION:   - floor transfers training - continue HIIT - continue gait training without AD for balance and righting (include multi plane, multidirectional)  - dynamic reaching and dynamic stepping balance   Note: Portions of this document were prepared using Dragon voice recognition software and although reviewed may contain unintentional dictation errors in syntax, grammar, or spelling.  Lonni KATHEE Gainer PT ,DPT Physical Therapist- [place]  [facility]    1:53 PM, 04/16/24

## 2024-04-16 NOTE — Therapy (Signed)
 Occupational Therapy Neuro Treatment Note   Patient Name: Haley Jones MRN: [id number] DOB:[birth year], 68 y.o., female Today's Date: 04/16/2024  PCP: Cleotilde Oneil FALCON, MD REFERRING PROVIDER: Pegge Toribio PARAS, PA-C   OT End of Session - 04/16/24 1706     Visit Number 22    Number of Visits 48    Date for OT Re-Evaluation 06/25/24    OT Start Time 1445    OT Stop Time 1530    OT Time Calculation (min) 45 min    Activity Tolerance Patient tolerated treatment well    Behavior During Therapy [facility] for tasks assessed/performed                  Past Medical History:  Diagnosis Date   Adnexal mass    Anxiety    Chronic back pain    Depression    Diabetes mellitus without complication (HCC)    Endometriosis 03/12/2018   GERD (gastroesophageal reflux disease)    Heart murmur 02/2018   undetected until she was an adult. 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   Weight loss 12/06/2017   Past Surgical History:  Procedure Laterality Date   ABDOMINAL HYSTERECTOMY  2008   ovaries also   CESAREAN SECTION  1994   COLONOSCOPY     COLONOSCOPY WITH PROPOFOL  N/A 02/12/2018   Procedure: COLONOSCOPY WITH PROPOFOL ;  Surgeon: Toledo, Ladell POUR, MD;  Location: [facility] ENDOSCOPY;  Service: Gastroenterology;  Laterality: N/A;   ESOPHAGOGASTRODUODENOSCOPY (EGD) WITH PROPOFOL  N/A 02/12/2018   Procedure: ESOPHAGOGASTRODUODENOSCOPY (EGD) WITH PROPOFOL ;  Surgeon: Toledo, Ladell POUR, MD;  Location: [facility] ENDOSCOPY;  Service: Gastroenterology;  Laterality: N/A;   EYE SURGERY Left 1973   muscle shortening to straighten cross eyes   LAPAROSCOPY N/A 02/20/2018   Procedure: LAPAROSCOPY DIAGNOSTIC, ( RESECTION OF RIGHT LOWER QUADRANT ABDOMINAL MASS);  Surgeon: Nicholaus Selinda Birmingham, MD;  Location: [facility] ORS;  Service: General;  Laterality: N/A;   ROTATOR CUFF REPAIR Left 2011   Patient Active Problem List    Diagnosis Date Noted   Depression with anxiety 01/04/2024   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   Acute CVA (cerebrovascular accident) (HCC) 12/20/2023   Primary hypertension 12/20/2023   Acute kidney injury superimposed on chronic kidney disease (HCC) 12/20/2023   DM type 2 with diabetic mixed hyperlipidemia (HCC) 11/17/2022   Major depressive disorder, recurrent, 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  Rationale for Evaluation and Treatment: Rehabilitation  SUBJECTIVE:   SUBJECTIVE STATEMENT:  Pt. reports doing well today. Pt accompanied by: self  PERTINENT HISTORY: Pt. Is a 68 yo female with hx of a L MCA CVA. Pt. Was admitted into ED on 12/20/2023 with an onset of RUE weakness, numbness dysmetria, and visual deficits. Upon assessment, it was concluded that Pt. Experienced multiple infarcts in the L Parietal/Occipital region. PMHx: Dizziness, Falls  PRECAUTIONS: None  WEIGHT BEARING RESTRICTIONS: No  PAIN:  Are you having pain?  no   04/16/24: No pain 04/02/24: No pain 03/12/2024: 6/10 pain  at the R dorsal forearm during gross grip strengthening task. 03/10/2024: No pain today. 03/05/2024: No pain, 2-3/10 level of discomfort during use of green resisitve clips.    FALLS: Has patient fallen in last 6 months? Yes. Number of falls 10  LIVING ENVIRONMENT: Lives with: lives with their family and lives alone Lives in: House/apartment Stairs: No, Ramp Has following equipment at home: Walker - 2 wheeled, Shower bench, and bed side commode  PLOF: Independent  PATIENT GOALS:  To be able to feed herself and hold a fork and knife better.   OBJECTIVE:  Note: Objective measures were completed at Evaluation unless otherwise noted.  HAND DOMINANCE: Left  ADLs: Overall ADLs:  Transfers/ambulation related to ADLs:  Eating: Independent  Grooming: independent UB Dressing: Pt. Has difficulty managing buttons LB Dressing: jean zippers are difficult to manipulate, hiking pants, and clothing negotiation are difficult. Toileting: Independent Bathing: Independent Tub Shower transfers: stepping into shower is difficult.  Equipment: Grab bars, shower bench, bedside commode  IADLs: Shopping: Does not currently do that/has not tried Light housekeeping: Does not currently do that/has not tried Meal Prep: Has not cooked in months, did not prior to CVA, sister provides meals. Community mobility: Relies on family, and friends Medication management: Assistance from sister to initiate medication management, she gives herself a shot for diabetes Financial management: Sister is assisting with monthly bill management Handwriting: Cursive form is 10% legible; Printed form is 90% legible  MOBILITY STATUS: Needs Assist: CGA and Hx of falls  POSTURE COMMENTS:  No Significant postural limitations Sitting balance: Good  ACTIVITY TOLERANCE: Activity tolerance: Good  FUNCTIONAL OUTCOME MEASURES: MAM-20: TBD  UPPER EXTREMITY ROM:    Active ROM Right eval Right 03/03/2024 Right  04/02/24 Left Eval [facility]  Shoulder flexion 50(58) 61(74) 62(76)   Shoulder abduction 43(48) 45(58) 52(58)   Shoulder adduction      Shoulder extension      Shoulder internal rotation      Shoulder external rotation      Elbow flexion 118(131) 125(128) 142(144)   Elbow extension -24(-22) -5(0) 0(0)   Wrist flexion 20 (21) 42(48) 64(72)   Wrist extension 30(31) 30(34) 52(58)   Wrist ulnar deviation      Wrist radial deviation      Wrist pronation      Wrist supination      (Blank rows = not  tested)  UPPER EXTREMITY MMT:     MMT Right eval Right 03/03/2024 Right 04/02/24 Left eval  Shoulder flexion 2/5 2/5 2/5 WFL  Shoulder abduction 2/5 2/5 2/5 [facility]  Shoulder adduction      Shoulder extension      Shoulder internal rotation      Shoulder external rotation      Middle trapezius      Lower trapezius      Elbow flexion 4-/5 4/5 4/5 WFL  Elbow extension 4-/5 4/5 4/5 WFL  Wrist flexion 2+/5 4-/5 4-/5 WFL  Wrist extension 3-/5 4-/5 4-/5 WFL  Wrist ulnar deviation      Wrist radial deviation      Wrist pronation      Wrist supination      (Blank rows = not tested)  HAND FUNCTION: Grip strength: Right: 21 lbs; Left: 14 lbs, Lateral pinch: Right: 7 lbs, Left: 4 lbs, and 3 point pinch: Right: 4 lbs, Left: 5 lbs  03/03/2024:  Grip strength: Right: 23#, Left: 28#, Lateral pinch: Right: 6#, Left: 5#, 3 point pinch: Right: 6#, Left: 3#  04/02/24:  Grip strength: Right: 31#, Left: 35#, Lateral pinch: Right: 8#, Left: 7#, 3 point pinch: Right: 8#, Left: 6#   COORDINATION: 9 Hole Peg test: Right: 72 sec; Left: 40 sec  03/03/2024: 9 hole peg test: Right: 1 min 3 sec, Left: 36 sec  04/02/24  9 hole peg test: Right: 53 sec, Left: 30 sec    SENSATION: Light touch: Impaired  Proprioception: Impaired   EDEMA: none  MUSCLE TONE: RUE: Mild  COGNITION: Overall cognitive status: Impaired  VISION: Subjective report: Pt. Has not noticed the changes in vision but her sister has. Caregiver reports that Pt. Does not bring much attention to the R side.  Baseline vision:  Visual history:   VISION ASSESSMENT: TBD   PERCEPTION: TBD  PRAXIS: Impaired: Motor planning                                                                                                                           TREATMENT DATE: 04/16/2024   Therapeutic Ex.:  -Pt. tolerated AAROM/PROM to the right shoulder through flexion, and abduction 2/2 stiffness. -Pt. Performed AAROM at the tabletop  surface.  Therapeutic Act.:   -Pt. worked on grasping, and manipulating 1/2, 3/4, and 1 washers from a magnetic dish using a 2 point grasp pattern. Pt. worked on reaching up, stabilizing, and sustaining shoulder elevation while placing the washer over a small precise target on vertical dowels positioned at various angles with assist required proximally at the elbow. -Pt. required the task to be modified with the vertical dowel placed horizontally half way through the task.  Neuro muscular re-education:   -facilitated right hand FMC tasks using the Grooved pegboard, grasping and 1 grooved pegs from a horizontal position in the shallow dish, and transitioning them to a vertical position in preparation for placing them upright into the pegboard.     PATIENT EDUCATION: Education details: BUE functioning,  Right shoulder ROM Person educated: Patient and Sister Education method: Explanation, Demonstration, Tactile cues, and Verbal cues Education comprehension: verbalized understanding and returned demonstration  HOME EXERCISE PROGRAM: Yellow theraputty HEP   GOALS: Goals reviewed with patient? Yes  SHORT TERM GOALS: Target date: 03/10/2024    Pt. Will be independent utilizing HEPs for hand strengthening exercises.  Baseline: Eval: No current HEP program Goal status: INITIAL   LONG TERM GOALS: Target date: 04/21/2024   Pt. Will improve BUE grip strength by 5# of force to be able to open jars. Baseline: 04/02/24: Grip strength: Right: 31#, Left: 35# 03/03/2024: R grip strength: 23#, L grip strength: 28#. Pt. has difficulty securely holding items in her hand. Eval: R grip strength: 21#, L grip strength: 14# Goal status: 04/02/24 achieved; revised to open jars   2.  Pt. Will improve RUE [facility] skills by 3 sec. to be able to manipulate  small objects. Baseline: 04/02/24: 9 hole peg test: Right: 53 sec, Left: 30 sec 03/03/2024: 9 hole peg test: R side: 1 min 3 sec,  L side: 36 sec. 9 hole peg  test; R side: 72 secs, L side: 40 secs Goal status: progressing,revised 8/20 for manipulating small objects.  3.  Pt. Will increase R shoulder flex/shoulder ABD ROM by 10 degrees to perform ADLs/IADLs.  Baseline: 04/02/24: Right shoulder flexion: 62(76), Abduction: 52(58) 03/03/2024: R shoulder flex: 61(74), R shoulder ABD: 45(58) R shoulder flex: 50(58), R shoulder ABD: 43(48) Goal status: progressing, ongoing  4.  Pt. Will improve handwriting to 50% legible in cursive form, legibility to be able to send written correspondence Baseline: 03/03/2024: 50% legible in cursive form, Eval: 10% legible in cursive form, 90% legible in printed form. Goal status: Discontinue goal per Pt. report that it has returned to baseline.  5.  Pt. Will improve BUE pinch strength by 2# of force to  be able to open medication bottles Baseline: 04/02/24: Lateral pinch: Right: 8#, Left: 7#, 3 point pinch: Right: 8#, Left: 6# 03/03/2024: Lateral Pinch: R: 6#, L: 5#, 3 pt pinch: R: 6#, L: 3#.  Eval: Lateral Pinch: 7, R 3 pt. Pinch: 4, L Lateral Pinch: 4, L 3 pt. Pinch: 5 Goal status:  04/02/24: Achieved; revised to be able to open medication bottles  6.  Pt. Will increase R wrist extension/flexion by 5 degrees in anticipation of reaching hold of items for ADLs. Baseline: 04/02/24: 52(58) 03/03/2024: R wrist ext: 30 (34), R Wrist flex: 42(48) Eval: R Wrist ext: 20(21), R Wrist flex: 30(31) Goal status: Achieved  ASSESSMENT:  CLINICAL IMPRESSION:  Pt. continues to present with stiffness in the right shoulder limiting ROM, requiring assist proximally.  Pt. required the vertical dowel to be modified to a horizontal plane 2/2 limited reach. Pt. Is improving with [facility] skills, moving and turning the pegs in her hand, to accurately line them up with the grooves. Pt. presents with difficulty aligning the direction of the grooved pegs accurately. Pt. continues to benefit from OT services to work on RUE ROM, RUE strengthening, bilateral  grip strength, bilateral pinch strength, bilateral [facility] skills, and Right sided awareness and attention in order to improve functional independence of ADL/IADL tasks at home.   PERFORMANCE DEFICITS: in functional skills including ADLs, IADLs, coordination, dexterity, proprioception, sensation, tone, ROM, strength, pain, Fine motor control, Gross motor control, endurance, and UE functional use, and psychosocial skills including environmental adaptation, habits, interpersonal interactions, and routines and behaviors.   IMPAIRMENTS: are limiting patient from ADLs, IADLs, rest and sleep, work, leisure, and social participation.   CO-MORBIDITIES: may have co-morbidities  that affects occupational performance. Patient will benefit from skilled OT to address above impairments and improve overall function.  MODIFICATION OR ASSISTANCE TO COMPLETE EVALUATION: Min-Moderate modification of tasks or assist with assess necessary to complete an evaluation.  OT OCCUPATIONAL PROFILE AND HISTORY: Detailed assessment: Review of records and additional review of physical, cognitive, psychosocial history related to current functional performance.  CLINICAL DECISION MAKING: Moderate - several treatment options, min-mod task modification necessary  REHAB POTENTIAL: Good  EVALUATION COMPLEXITY: Moderate    PLAN:  OT FREQUENCY: 1x/week  OT DURATION: 12 weeks  PLANNED INTERVENTIONS: 97535 self care/ADL training, 02889 therapeutic exercise, 97530 therapeutic activity, 97112 neuromuscular re-education, 97018 paraffin, 02989 moist heat, 97010 cryotherapy, 97034 contrast bath, 97032 electrical stimulation (manual), passive range of motion, visual/perceptual remediation/compensation, energy conservation, patient/family education, and DME and/or AE instructions  RECOMMENDED OTHER SERVICES: ST & PT  CONSULTED AND AGREED WITH PLAN OF CARE: Patient and family member/caregiver  PLAN FOR NEXT SESSION: treatment   Richardson  Loras Grieshop, MS, OTR/L   04/16/2024, 5:10 PM

## 2024-04-21 ENCOUNTER — Ambulatory Visit: Admitting: Physical Therapy

## 2024-04-21 ENCOUNTER — Ambulatory Visit: Admitting: Occupational Therapy

## 2024-04-21 ENCOUNTER — Ambulatory Visit: Admitting: Speech Pathology

## 2024-04-21 DIAGNOSIS — M6281 Muscle weakness (generalized): Secondary | ICD-10-CM

## 2024-04-21 DIAGNOSIS — R269 Unspecified abnormalities of gait and mobility: Secondary | ICD-10-CM

## 2024-04-21 DIAGNOSIS — R2681 Unsteadiness on feet: Secondary | ICD-10-CM

## 2024-04-21 NOTE — Therapy (Signed)
 OUTPATIENT PHYSICAL THERAPY TREATMENT   Patient Name: Haley Jones MRN: [id number] DOB:[birth year], 68 y.o., female Today's Date: 04/21/2024  PCP: Dr. Oneil Jones REFERRING PROVIDER: Toribio Pitch, PA-C  END OF SESSION:   PT End of Session - 04/21/24 1406     Visit Number 24    Number of Visits 24    Date for PT Re-Evaluation 04/21/24    Authorization Type [employer]    Progress Note Due on Visit 30    PT Start Time 1403    PT Stop Time 1445    PT Time Calculation (min) 42 min    Equipment Utilized During Treatment Gait belt    Activity Tolerance Patient tolerated treatment well    Behavior During Therapy WFL for tasks assessed/performed                  Past Medical History:  Diagnosis Date   Adnexal mass    Anxiety    Chronic back pain    Depression    Diabetes mellitus without complication (HCC)    Endometriosis 03/12/2018   GERD (gastroesophageal reflux disease)    Heart murmur 02/2018   undetected until she was an adult. 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   Weight loss 12/06/2017   Past Surgical History:  Procedure Laterality Date   ABDOMINAL HYSTERECTOMY  2008   ovaries also   CESAREAN SECTION  1994   COLONOSCOPY     COLONOSCOPY WITH PROPOFOL  N/A 02/12/2018   Procedure: COLONOSCOPY WITH PROPOFOL ;  Surgeon: Toledo, Ladell POUR, MD;  Location: [facility];  Service: Gastroenterology;  Laterality: N/A;   ESOPHAGOGASTRODUODENOSCOPY (EGD) WITH PROPOFOL  N/A 02/12/2018   Procedure: ESOPHAGOGASTRODUODENOSCOPY (EGD) WITH PROPOFOL ;  Surgeon: Toledo, Ladell POUR, MD;  Location: [facility];  Service: Gastroenterology;  Laterality: N/A;   EYE SURGERY Left 1973   muscle shortening to straighten cross eyes   LAPAROSCOPY N/A 02/20/2018   Procedure: LAPAROSCOPY DIAGNOSTIC, ( RESECTION OF RIGHT LOWER QUADRANT ABDOMINAL MASS);  Surgeon: Haley Haley Birmingham, MD;   Location: [facility];  Service: General;  Laterality: N/A;   ROTATOR CUFF REPAIR Left 2011   Patient Active Problem List   Diagnosis Date Noted   Cognitive communication deficit 04/07/2024   Aphasia 04/07/2024   Urinary incontinence as sequela of cerebrovascular accident (CVA) 02/11/2024   Depression with anxiety 01/04/2024   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   Acute CVA (cerebrovascular accident) (HCC) 12/20/2023   Primary hypertension 12/20/2023   Acute kidney injury superimposed on chronic kidney disease (HCC) 12/20/2023   DM type 2 with diabetic mixed hyperlipidemia (HCC) 11/17/2022   Major depressive disorder, recurrent, 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  Rationale for Evaluation and Treatment: Rehabilitation  SUBJECTIVE:  SUBJECTIVE STATEMENT:   Denies any falls since last session.  Had a nice weekend and got a haircut.   PERTINENT HISTORY:Pt states she is not sure the exact date of when her CVA happened because she kept falling at home trying to care for herself and her daughter, but she finally went to the hospital when her sister insisted on it. Pt states her special needs daughter, Haley Jones, passed away recently while pt was in the hospital at [facility] before she was transferred to [facility] for stroke rehabilitation.Pt's sister  states pt requires supervision/cuing for ADLs  to don clothes correctly, with correct orientation (potential apraxia?) Pt/sister report pt has R side inattention.Pt reports supposedly one of her legs is shorter than the other, believes her R LE might be the shorter one. 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     PAIN:  Are you having pain? No  PRECAUTIONS: Fall RED FLAGS: None  WEIGHT BEARING RESTRICTIONS: No FALLS: Has patient fallen in last 6 months? Yes. Number of falls 6+ before going to hospital and 1 fall since being home from CIR - pt states she was trying to gather her clothes by herself rather than waiting for her sister to get there to help her  LIVING ENVIRONMENT Lives with: lives alone and but pt's sister, Haley Jones, comes over daily to assist with ADLs and stays to provide supervision all day - pt states at night she would be able to walk to bathroom using RW if needed (but she wears depends in event of incontinence) Lives in: House/apartment Stairs: she has steps, but she doesn't have to use them (house was wheelchair accessible for her daughter)  Has following equipment at home: Environmental consultant - 2 wheeled, shower chair, Grab bars, Ramped entry, and transport chair  PLOF: Independent, Independent with household mobility without device, Independent with homemaking with ambulation, Independent with gait, Independent with transfers, and she was the primary caregiver for her recently deceased daughter   CLOF: Sister provides supervision daily for pt to ambulate using RW safely and provides assist for ADLs (showering and dressing - progressing towards supervision with these activities); Sister states pt requires cues to turn and step back fully prior to sitting to ensure her safety  PATIENT GOALS: get back to being able to go shopping with improved community level ambulation without the walker; return to driving   OBJECTIVE:  Note:  Objective measures were completed at Evaluation unless otherwise noted.  DIAGNOSTIC FINDINGS:   EXAM: MRI HEAD WITHOUT CONTRAST   TECHNIQUE: Multiplanar, multiecho pulse sequences of the brain and surrounding structures were obtained without intravenous contrast.   COMPARISON:  Brain MRI 07/31/2022.  IMPRESSION: 1. Patchy acute cortical/subcortical infarcts within the left parietal and occipital lobes individually measuring up to 3 cm (MCA vascular territory). Subtle petechial hemorrhage associated with some of these infarcts. No frank hemorrhagic conversion. 2. Additional punctate left MCA territory cortical acute infarct within the left insula. 3. Background parenchymal atrophy and chronic small vessel ischemic disease with chronic lacunar infarcts, as described. 4. Paranasal sinus disease as outlined (including severe right maxillary sinusitis).   Electronically Signed   By: Rockey Childs D.O.   On: 12/20/2023 11:52  COGNITION: Overall cognitive status: Within functional limits for tasks assessed   SENSATION: Light touch: initially misses the first 1-2 touches on R LE, but then able to feel remaining touches Proprioception: Impaired grossly  COORDINATION: Symmetrical heel-to-shin bilaterally  EDEMA:  Not formally assessed, but none observed  MUSCLE TONE: Not formally assessed  POSTURE: rounded shoulders, forward head, and posterior pelvic tilt  LOWER EXTREMITY MMT:    MMT Right Eval Left Eval  Hip flexion 3+, no pain 3+ with some L hip and back pain with this  Hip extension    Hip abduction    Hip adduction    Hip internal rotation    Hip external rotation    Knee flexion 3+ 4  Knee extension 4- 4  Ankle dorsiflexion 3+ 4  Ankle plantarflexion 4- 4  Ankle inversion    Ankle eversion    (Blank rows = not tested)  BED MOBILITY:  Findings: Sit to supine need to assess Supine to sit need to assess *pt reports some difficulty with bed  mobility  TRANSFERS: Sit to stand: SBA  Assistive device utilized: Environmental consultant - 2 wheeled     Stand to sit: SBA  Assistive device utilized: Environmental consultant - 2 wheeled     Chair to chair: SBA  Assistive device utilized: Environmental consultant - 2 wheeled       GAIT: Findings: Gait Characteristics: slight R ankle instability noted, step through pattern, decreased stance time- Right, decreased stride length, and decreased ankle dorsiflexion- Right, Distance walked: ~137ft, Assistive device utilized:Walker - 2 wheeled, Level of assistance: SBA and CGA, and Comments:    FUNCTIONAL TESTS:  5 times sit to stand: 38.75 seconds, using UE support 10 meter walk test: 0.48 m/s using youth RW, requires CGA for safety 6 minute walk test: Visit 2, see note Berg Balance Scale: Visit 2, see note Functional gait assessment: need to assess, when appropriate  PATIENT SURVEYS:  ABC scale 7.5%                                                                                                                             TREATMENT DATE: 04/21/2024  Unless otherwise stated, CGA was provided and gait belt donned in order to ensure pt safety   TA- To improve functional movements patterns for everyday tasks    Gait x 1000 ft with 4# AW and RW   Step taps with cues for no UE assist and to look at step occasionally. Difficulty with looking down at steps.    TE- To improve strength, endurance, mobility, and function of specific targeted muscle groups or improve joint range of motion or improve muscle flexibility  LAQ 2 x 15 ea LE   TA- To improve functional movements patterns for everyday tasks   Started with weaving between bolsters. X 4 rounds   -added blaze pod taps to tap on each bolster when walking past x 4 laps, increased cues.    - placed bolsters in circle of 5 bolsters and had her weave between x 5 laps     PATIENT EDUCATION: Education details: PT POC, findings on assessment today, recommendation to continue using RW for all  functional mobility Person educated: Patient and Caregiver  Haley Jones Education method: Explanation Education comprehension: verbalized understanding and needs further education  HOME EXERCISE PROGRAM: Access Code: [email or web address] URL: [email or web address] Date: 02/04/2024 Prepared by: Peggye Linear  Exercises - Standing March with Counter Support  - 2 x daily - 7 x weekly - 2 sets - 20 reps - Side stepping with counter support: yellow band loop  - 2 x daily - 7 x weekly - 2 sets - 20 reps - Sit to Stand with Counter Support  - 2 x daily - 7 x weekly - 2 sets - 12 reps   GOALS: Goals reviewed with patient? Yes  SHORT TERM GOALS: Target date: 03/10/2024  Pt will be independent with HEP in order to improve strength and balance in order to decrease fall risk and improve function at home and work.  Baseline: need to initiate7/16: 2-3x week Goal status:  MET  LONG TERM GOALS: Target date: 04/21/2024  1.  Patient will complete five times sit to stand test in < 15 seconds indicating an increased LE strength and improved balance. Baseline:  38.75 seconds, using UE support, 7/16: 20.35 sec, UE support on 1st STS only 8/20:16.86 sec no UE support  Goal Status: ONGOING   2.  Patient will increase ABC scale score >80% to demonstrate better functional mobility and better confidence with ADLs.   Baseline: 7.5%, 7/16: 18.125% 8/20: 18.75% Goal status: ONGOING   3.  Patient will increase Berg Balance score by > 6 points to demonstrate decreased fall risk during functional activities. Baseline: need to assess; 01/30/2024= 34/56, 7/16= 39 8/20:42 Goal status: MET   4. Patient will increase 10 meter walk test to >1.44m/s as to improve gait speed for better community ambulation and to reduce fall risk. Baseline: 0.48 m/s using youth RW, requires CGA for safety, 7/16: 0.69 m/s 8/20:  .76 m/s Goal status: ONGOING  5. Patient will increase six minute walk test distance to >1000 for progression to  community ambulator and improve gait ability Baseline: need to assess; 01/30/2024=410 feet using RW; 02/13/24: 462ft in 66m30s, 7/16: 654ft using 2WW 8/20: 900 ft with RW  Goal status: ONGOING   ASSESSMENT:  CLINICAL IMPRESSION:    Patient arrived with good motivation for completion of pt activities.  Pt progressed with resisted gait as well as with dynamic gait and balance interventions. Plan to challenge pt direction with walking to work on awareness in community settings.  Focused on attention to objects and weaving around various obstacles without upper extremity support this date.  Also challenge dual task of blaze pods to increase patient awareness of objects.  Patient continues to have difficulty with looking down which could be related to visual field deficits from CVA.  Patient is continuing to show improvement and will complete recertification note next visit.  Pt will continue to benefit from skilled physical therapy intervention to address impairments, improve QOL, and attain therapy goals.   OBJECTIVE IMPAIRMENTS: Abnormal gait, decreased activity tolerance, decreased balance, decreased endurance, decreased knowledge of use of DME, decreased mobility, difficulty walking, decreased strength, decreased safety awareness, impaired vision/preception, and pain.   ACTIVITY LIMITATIONS: carrying, lifting, bending, standing, squatting, stairs, transfers, bed mobility, bathing, toileting, dressing, reach over head, hygiene/grooming, and locomotion level  PARTICIPATION LIMITATIONS: meal prep, cleaning, laundry, driving, shopping, and community activity  PERSONAL FACTORS: Age, Time since onset of injury/illness/exacerbation, and 3+ comorbidities: Hypertension, B12 deficiency, type 2 diabetes, hyperlipidemia, CKD stage IIIa are also affecting patient's functional outcome.   REHAB POTENTIAL: Good  CLINICAL DECISION MAKING:  Evolving/moderate complexity  EVALUATION COMPLEXITY: Moderate  PLAN:  PT  FREQUENCY: 1-2x/week  PT DURATION: 12 weeks  PLANNED INTERVENTIONS: [id number]- PT Re-evaluation, 97750- Physical Performance Testing, 97110-Therapeutic exercises, 97530- Therapeutic activity, [id number]- Neuromuscular re-education, 97535- Self Care, [id number]- Manual therapy, [id number]- Gait training, [phone]- Orthotic Initial, [phone]- Orthotic/Prosthetic subsequent, [phone]- Canalith repositioning, [phone]- Electrical stimulation (manual), Patient/Family education, Balance training, Stair training, Taping, Joint mobilization, Spinal mobilization, Vestibular training, Visual/preceptual remediation/compensation, DME instructions, Cryotherapy, Moist heat, and Biofeedback  PLAN FOR NEXT SESSION:   - floor transfers training - continue HIIT - continue gait training without AD for balance and righting (include multi plane, multidirectional)  - dynamic reaching and dynamic stepping balance   Note: Portions of this document were prepared using Dragon voice recognition software and although reviewed may contain unintentional dictation errors in syntax, grammar, or spelling.  Lonni KATHEE Gainer PT ,DPT Physical Therapist- [facility]    2:21 PM, 04/21/24

## 2024-04-23 ENCOUNTER — Ambulatory Visit: Admitting: Speech Pathology

## 2024-04-23 ENCOUNTER — Ambulatory Visit: Admitting: Occupational Therapy

## 2024-04-23 ENCOUNTER — Ambulatory Visit: Admitting: Physical Therapy

## 2024-04-23 DIAGNOSIS — M6281 Muscle weakness (generalized): Secondary | ICD-10-CM | POA: Diagnosis not present

## 2024-04-23 DIAGNOSIS — R278 Other lack of coordination: Secondary | ICD-10-CM

## 2024-04-23 DIAGNOSIS — I69351 Hemiplegia and hemiparesis following cerebral infarction affecting right dominant side: Secondary | ICD-10-CM

## 2024-04-23 DIAGNOSIS — R269 Unspecified abnormalities of gait and mobility: Secondary | ICD-10-CM

## 2024-04-23 DIAGNOSIS — R2681 Unsteadiness on feet: Secondary | ICD-10-CM

## 2024-04-23 DIAGNOSIS — H543 Unqualified visual loss, both eyes: Secondary | ICD-10-CM

## 2024-04-23 NOTE — Therapy (Signed)
 Occupational Therapy Neuro Treatment Note   Patient Name: Haley Jones MRN: [id number] DOB:07-18-1956, 68 y.o., female Today's Date: 04/23/2024  PCP: Cleotilde Oneil FALCON, MD REFERRING PROVIDER: Pegge Toribio PARAS, PA-C   OT End of Session - 04/23/24 1555     Visit Number 23    Number of Visits 48    Date for OT Re-Evaluation 06/25/24    OT Start Time 1445    OT Stop Time 1530    OT Time Calculation (min) 45 min    Activity Tolerance Patient tolerated treatment well    Behavior During Therapy [facility] for tasks assessed/performed                  Past Medical History:  Diagnosis Date   Adnexal mass    Anxiety    Chronic back pain    Depression    Diabetes mellitus without complication (HCC)    Endometriosis 03/12/2018   GERD (gastroesophageal reflux disease)    Heart murmur 02/2018   undetected until she was an adult. 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   Weight loss 12/06/2017   Past Surgical History:  Procedure Laterality Date   ABDOMINAL HYSTERECTOMY  2008   ovaries also   CESAREAN SECTION  1994   COLONOSCOPY     COLONOSCOPY WITH PROPOFOL  N/A 02/12/2018   Procedure: COLONOSCOPY WITH PROPOFOL ;  Surgeon: Toledo, Ladell POUR, MD;  Location: [facility];  Service: Gastroenterology;  Laterality: N/A;   ESOPHAGOGASTRODUODENOSCOPY (EGD) WITH PROPOFOL  N/A 02/12/2018   Procedure: ESOPHAGOGASTRODUODENOSCOPY (EGD) WITH PROPOFOL ;  Surgeon: Toledo, Ladell POUR, MD;  Location: [facility];  Service: Gastroenterology;  Laterality: N/A;   EYE SURGERY Left 1973   muscle shortening to straighten cross eyes   LAPAROSCOPY N/A 02/20/2018   Procedure: LAPAROSCOPY DIAGNOSTIC, ( RESECTION OF RIGHT LOWER QUADRANT ABDOMINAL MASS);  Surgeon: Nicholaus Selinda Birmingham, MD;  Location: [facility] ORS;  Service: General;  Laterality: N/A;   ROTATOR CUFF REPAIR Left 2011   Patient Active Problem List    Diagnosis Date Noted   Depression with anxiety 01/04/2024   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   Acute CVA (cerebrovascular accident) (HCC) 12/20/2023   Primary hypertension 12/20/2023   Acute kidney injury superimposed on chronic kidney disease (HCC) 12/20/2023   DM type 2 with diabetic mixed hyperlipidemia (HCC) 11/17/2022   Major depressive disorder, recurrent, 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  Rationale for Evaluation and Treatment: Rehabilitation  SUBJECTIVE:   SUBJECTIVE STATEMENT:  Pt. 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 Pt accompanied by: self  PERTINENT HISTORY: Pt. Is a 68 yo female with hx of a L MCA CVA. Pt. Was admitted into ED on 12/20/2023 with an onset of RUE weakness, numbness dysmetria, and visual deficits. Upon assessment, it was concluded that Pt. Experienced multiple infarcts in the L Parietal/Occipital region. PMHx: Dizziness, Falls  PRECAUTIONS: None  WEIGHT BEARING RESTRICTIONS: No  PAIN:  Are you having pain?  no  04/23/24: No pain 04/16/24:  No pain 04/02/24: No pain 03/12/2024: 6/10 pain at the R dorsal forearm during gross grip strengthening task. 03/10/2024: No pain today. 03/05/2024: No pain, 2-3/10 level of discomfort during use of green resisitve clips.    FALLS: Has patient fallen in last 6 months? Yes. Number of falls 10  LIVING ENVIRONMENT: Lives with: lives with their family and lives alone Lives in: House/apartment Stairs: No, Ramp Has following equipment at home: Walker - 2 wheeled, Shower bench, and bed side  commode  PLOF: Independent  PATIENT GOALS: To be able to feed herself and hold a fork and knife better.   OBJECTIVE:  Note: Objective measures were completed at Evaluation unless otherwise noted.  HAND DOMINANCE: Left  ADLs: Overall ADLs:  Transfers/ambulation related to ADLs:  Eating: Independent  Grooming: independent UB Dressing: Pt. Has difficulty managing buttons LB Dressing: jean zippers are difficult to manipulate, hiking pants, and clothing negotiation are difficult. Toileting: Independent Bathing: Independent Tub Shower transfers: stepping into shower is difficult.  Equipment: Grab bars, shower bench, bedside commode  IADLs: Shopping: Does not currently do that/has not tried Light housekeeping: Does not currently do that/has not tried Meal Prep: Has not cooked in months, did not prior to CVA, sister provides meals. Community mobility: Relies on family, and friends Medication management: Assistance from sister to initiate medication management, she gives herself a shot for diabetes Financial management: Sister is assisting with monthly bill management Handwriting: Cursive form is 10% legible; Printed form is 90% legible  MOBILITY STATUS: Needs Assist: CGA and Hx of falls  POSTURE COMMENTS:  No Significant postural limitations Sitting balance: Good  ACTIVITY TOLERANCE: Activity tolerance: Good  FUNCTIONAL OUTCOME MEASURES:   UPPER EXTREMITY ROM:    Active ROM Right eval Right 03/03/2024 Right  04/02/24 Left Eval [facility]  Shoulder flexion 50(58) 61(74) 62(76)   Shoulder abduction 43(48) 45(58) 52(58)   Shoulder adduction      Shoulder extension      Shoulder internal rotation      Shoulder external rotation      Elbow flexion 118(131) 125(128) 142(144)   Elbow extension -24(-22) -5(0) 0(0)   Wrist flexion 20 (21) 42(48) 64(72)   Wrist extension 30(31) 30(34) 52(58)   Wrist ulnar deviation      Wrist radial deviation      Wrist pronation      Wrist  supination      (Blank rows = not tested)  UPPER EXTREMITY MMT:     MMT Right eval Right 03/03/2024 Right 04/02/24 Left eval  Shoulder flexion 2/5 2/5 2/5 WFL  Shoulder abduction 2/5 2/5 2/5 [facility]  Shoulder adduction      Shoulder extension      Shoulder internal rotation      Shoulder external rotation      Middle trapezius      Lower trapezius      Elbow flexion 4-/5 4/5 4/5 WFL  Elbow extension 4-/5 4/5 4/5 WFL  Wrist flexion 2+/5 4-/5 4-/5 WFL  Wrist extension 3-/5 4-/5 4-/5 WFL  Wrist ulnar deviation      Wrist radial deviation      Wrist pronation      Wrist supination      (Blank rows = not tested)  HAND FUNCTION: Grip strength: Right: 21 lbs; Left: 14 lbs, Lateral pinch: Right: 7 lbs, Left: 4 lbs, and 3 point pinch: Right: 4 lbs, Left: 5 lbs  03/03/2024:  Grip strength: Right: 23#, Left: 28#, Lateral pinch: Right: 6#, Left: 5#,  3 point pinch: Right: 6#, Left: 3#  04/02/24:  Grip strength: Right: 31#, Left: 35#, Lateral pinch: Right: 8#, Left: 7#, 3 point pinch: Right: 8#, Left: 6#   COORDINATION: 9 Hole Peg test: Right: 72 sec; Left: 40 sec  03/03/2024: 9 hole peg test: Right: 1 min 3 sec, Left: 36 sec  04/02/24  9 hole peg test: Right: 53 sec, Left: 30 sec    SENSATION: Light touch: Impaired  Proprioception: Impaired   EDEMA: none  MUSCLE TONE: RUE: Mild  COGNITION: Overall cognitive status: Impaired  VISION: Subjective report: Pt. Has not noticed the changes in vision but her sister has. Caregiver reports that Pt. Does not bring much attention to the R side.  Baseline vision:  Visual history:   VISION ASSESSMENT: TBD   PERCEPTION: TBD  PRAXIS: Impaired: Motor planning                                                                                                                           TREATMENT DATE: 04/23/2024   Therapeutic Act.:   -MVPT screen was administered. Pt. Misidentified 12/36 items including: Visual discrimination-same:  0/3, visual discrimination-Figure ground: 3/5 items, visual discrimination: 2/5, visual memory: 1/8, visual closure: 3/11, Visual discrimination-different items: 3/4 -Visual screening using BiVABA Word Search completed in 2 min. & 19 sec. With 3 omissions (1 to the right, 1 to the left, and 1 center), BiVABAstructured complex circles search: completed in 2 min with 3 omissions (1 to the right, 1 to the left, and 1 center), BiVABA Random plain circles-crowded-completed in 1 min. & 6 sec. with 100% accuracy, BiVABA Random complex circles search: completed in 1 min. & 41 sec. With 8 omissions (2 on the left, and 6 to the right) -Facilitated right hand FMC grasp for 1 resistive cubes alternating thumb opposition to the tip of the 2nd/3rd digits while the board is placed at a vertical angle to encourage wrist extension. Emphasis was placed on islolating 2nd digit extension to press them into place.   -Pt. education was provided about visual compensatory strategies within her environment, as well as during table top tasks.       PATIENT EDUCATION: Education details: visual perceptual skills, visual compensatory strategies , RUE weakness, BUE functioning,  Right shoulder ROM Person educated: Patient and Sister Education method: Explanation, Demonstration, Tactile cues, and Verbal cues Education comprehension: verbalized understanding and returned demonstration  HOME EXERCISE PROGRAM: Yellow theraputty HEP   GOALS: Goals reviewed with patient? Yes  SHORT TERM GOALS: Target date: 03/10/2024    Pt. Will be independent utilizing HEPs for hand strengthening exercises.  Baseline: Eval: No current HEP program Goal status: INITIAL   LONG TERM GOALS: Target date: 04/21/2024   Pt. Will improve BUE grip strength by 5# of force to be able to open jars. Baseline: 04/02/24: Grip strength: Right: 31#, Left: 35# 03/03/2024: R grip strength: 23#, L grip strength: 28#. Pt. has difficulty securely holding items  in her hand.  Eval: R grip strength: 21#, L grip strength: 14# Goal status: 04/02/24 achieved; revised to open jars   2.  Pt. Will improve RUE [facility] skills by 3 sec. to be able to manipulate  small objects. Baseline: 04/02/24: 9 hole peg test: Right: 53 sec, Left: 30 sec 03/03/2024: 9 hole peg test: R side: 1 min 3 sec, L side: 36 sec. 9 hole peg test; R side: 72 secs, L side: 40 secs Goal status: progressing, revised 8/20 for manipulating small objects.  3.  Pt. Will increase R shoulder flex/shoulder ABD ROM by 10 degrees to perform ADLs/IADLs.  Baseline: 04/02/24: Right shoulder flexion: 62(76), Abduction: 52(58) 03/03/2024: R shoulder flex: 61(74), R shoulder ABD: 45(58) R shoulder flex: 50(58), R shoulder ABD: 43(48) Goal status: progressing, ongoing  4.  Pt. Will improve handwriting to 50% legible in cursive form, legibility to be able to send written correspondence Baseline: 03/03/2024: 50% legible in cursive form, Eval: 10% legible in cursive form, 90% legible in printed form. Goal status: Discontinue goal per Pt. report that it has returned to baseline.  5.  Pt. Will improve BUE pinch strength by 2# of force to  be able to open medication bottles Baseline: 04/02/24: Lateral pinch: Right: 8#, Left: 7#, 3 point pinch: Right: 8#, Left: 6# 03/03/2024: Lateral Pinch: R: 6#, L: 5#, 3 pt pinch: R: 6#, L: 3#.  Eval: Lateral Pinch: 7, R 3 pt. Pinch: 4, L Lateral Pinch: 4, L 3 pt. Pinch: 5 Goal status:  04/02/24: Achieved; revised to be able to open medication bottles  6.  Pt. Will increase R wrist extension/flexion by 5 degrees in anticipation of reaching hold of items for ADLs. Baseline: 04/02/24: 52(58) 03/03/2024: R wrist ext: 30 (34), R Wrist flex: 42(48) Eval: R Wrist ext: 20(21), R Wrist flex: 30(31) Goal status: Achieved  ASSESSMENT:  CLINICAL IMPRESSION:  Pt. presents with multiple misidentifications on the MVPT, and multiple omissions on the BiVABA visual screening task to the right, as well as  some to the left. Pt. required increased time to complete each of the tasks. Pt. was able to locate, and correct the omissions with cues. Pt. Misidentified multiple items in the figure ground, visual discrimination, visual closure, and visual discrimination of different items. Pt. misidentified only 1 item in visual memory, and had 100% accuracy of visual discrimination of  similar objects. Pt. Required verbal cues, and education about engaging the right hand/UE when grasping cubes as Pt. Reports that she is left handed. Pt. continues to benefit from OT services to work on RUE ROM, RUE strengthening, bilateral grip strength, bilateral pinch strength, bilateral [facility] skills, and Right sided awareness and attention in order to improve functional independence of ADL/IADL tasks at home.   PERFORMANCE DEFICITS: in functional skills including ADLs, IADLs, coordination, dexterity, proprioception, sensation, tone, ROM, strength, pain, Fine motor control, Gross motor control, endurance, and UE functional use, and psychosocial skills including environmental adaptation, habits, interpersonal interactions, and routines and behaviors.   IMPAIRMENTS: are limiting patient from ADLs, IADLs, rest and sleep, work, leisure, and social participation.   CO-MORBIDITIES: may have co-morbidities  that affects occupational performance. Patient will benefit from skilled OT to address above impairments and improve overall function.  MODIFICATION OR ASSISTANCE TO COMPLETE EVALUATION: Min-Moderate modification of tasks or assist with assess necessary to complete an evaluation.  OT OCCUPATIONAL PROFILE AND HISTORY: Detailed assessment: Review of records and additional review of physical, cognitive, psychosocial history related to current functional performance.  CLINICAL DECISION MAKING: Moderate -  several treatment options, min-mod task modification necessary  REHAB POTENTIAL: Good  EVALUATION COMPLEXITY:  Moderate    PLAN:  OT FREQUENCY: 1x/week  OT DURATION: 12 weeks  PLANNED INTERVENTIONS: 97535 self care/ADL training, 97110 therapeutic exercise, 97530 therapeutic activity, 97112 neuromuscular re-education, 97018 paraffin, 97010 moist heat, 97010 cryotherapy, 97034 contrast bath, 97032 electrical stimulation (manual), passive range of motion, visual/perceptual remediation/compensation, energy conservation, patient/family education, and DME and/or AE instructions  RECOMMENDED OTHER SERVICES: ST & PT  CONSULTED AND AGREED WITH PLAN OF CARE: Patient and family member/caregiver  PLAN FOR NEXT SESSION: treatment   Selah Zelman, MS, OTR/L   04/23/2024, 3:58 PM

## 2024-04-23 NOTE — Therapy (Unsigned)
 OUTPATIENT PHYSICAL THERAPY TREATMENT   Patient Name: Haley Jones MRN: [id number] DOB:Dec 05, 1955, 68 y.o., female Today's Date: 04/23/2024  PCP: Dr. Oneil Pinal REFERRING PROVIDER: Toribio Pitch, PA-C  END OF SESSION:   PT End of Session - 04/23/24 1428     Visit Number 25    Number of Visits 40    Date for PT Re-Evaluation 06/18/24    Authorization Type [employer]    Progress Note Due on Visit 30    PT Start Time 1402    PT Stop Time 1444    PT Time Calculation (min) 42 min    Equipment Utilized During Treatment Gait belt    Activity Tolerance Patient tolerated treatment well    Behavior During Therapy WFL for tasks assessed/performed                  Past Medical History:  Diagnosis Date   Adnexal mass    Anxiety    Chronic back pain    Depression    Diabetes mellitus without complication (HCC)    Endometriosis 03/12/2018   GERD (gastroesophageal reflux disease)    Heart murmur 02/2018   undetected until she was an adult. 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   Weight loss 12/06/2017   Past Surgical History:  Procedure Laterality Date   ABDOMINAL HYSTERECTOMY  2008   ovaries also   CESAREAN SECTION  1994   COLONOSCOPY     COLONOSCOPY WITH PROPOFOL  N/A 02/12/2018   Procedure: COLONOSCOPY WITH PROPOFOL ;  Surgeon: Toledo, Ladell POUR, MD;  Location: [facility];  Service: Gastroenterology;  Laterality: N/A;   ESOPHAGOGASTRODUODENOSCOPY (EGD) WITH PROPOFOL  N/A 02/12/2018   Procedure: ESOPHAGOGASTRODUODENOSCOPY (EGD) WITH PROPOFOL ;  Surgeon: Toledo, Ladell POUR, MD;  Location: [facility];  Service: Gastroenterology;  Laterality: N/A;   EYE SURGERY Left 1973   muscle shortening to straighten cross eyes   LAPAROSCOPY N/A 02/20/2018   Procedure: LAPAROSCOPY DIAGNOSTIC, ( RESECTION OF RIGHT LOWER QUADRANT ABDOMINAL MASS);  Surgeon: Nicholaus Selinda Birmingham, MD;   Location: [facility];  Service: General;  Laterality: N/A;   ROTATOR CUFF REPAIR Left 2011   Patient Active Problem List   Diagnosis Date Noted   Cognitive communication deficit 04/07/2024   Aphasia 04/07/2024   Urinary incontinence as sequela of cerebrovascular accident (CVA) 02/11/2024   Depression with anxiety 01/04/2024   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   Acute CVA (cerebrovascular accident) (HCC) 12/20/2023   Primary hypertension 12/20/2023   Acute kidney injury superimposed on chronic kidney disease (HCC) 12/20/2023   DM type 2 with diabetic mixed hyperlipidemia (HCC) 11/17/2022   Major depressive disorder, recurrent, 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/17/2022   B12 deficiency 02/23/2021   SBO (small bowel obstruction) (HCC) 01/29/2019   Barrett's esophagus without dysplasia 04/15/2018   Weight loss 12/06/2017   Hoarse 12/06/2017   Lumbar disc disease 07/26/2017   Diabetes mellitus type 2, controlled, without complications (HCC) 02/09/2015   Surgical menopause 11/26/2014    ONSET DATE: 12/20/2023  REFERRING DIAG: P36.487 (ICD-10-CM) - Left middle cerebral artery stroke (HCC)   THERAPY DIAG:  Unsteadiness on feet  Abnormality of gait  Muscle weakness (generalized)  Hemiplegia and hemiparesis following cerebral infarction affecting right dominant side (HCC)  Rationale for Evaluation and Treatment: Rehabilitation  SUBJECTIVE:  SUBJECTIVE STATEMENT:   Denies any falls since last session.   Feels like she has been doing well. Walks from upstairs to clinic with RW.   PERTINENT HISTORY:Pt states she is not sure the exact date of when her CVA happened because she kept falling at home trying to care for herself and her daughter, but she finally went to the hospital when her sister insisted on it. Pt  states her special needs daughter, [name], passed away recently while pt was in the hospital at [facility] before she was transferred to [facility] for stroke rehabilitation.Pt's sister states pt requires supervision/cuing for ADLs  to don clothes correctly, with correct orientation (potential apraxia?) Pt/sister report pt has R side inattention.Pt reports supposedly one of her legs is shorter than the other, believes her R LE might be the shorter one. 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     PAIN:  Are you having pain? No  PRECAUTIONS: Fall RED FLAGS: None  WEIGHT BEARING RESTRICTIONS: No FALLS: Has patient fallen in last 6 months? Yes. Number of falls 6+ before going to hospital and 1 fall since being home from CIR - pt states she was trying to gather her clothes by herself rather than waiting for her sister to get there to help her  LIVING ENVIRONMENT Lives with: lives alone and but pt's sister, [name], comes over daily to assist with ADLs and stays to provide supervision all day - pt states at night she would be able to walk to bathroom using RW if needed (but she wears depends in event of incontinence) Lives in: House/apartment Stairs: she has steps, but she doesn't have to use them (house was wheelchair accessible for her daughter)  Has following equipment at home: Environmental consultant - 2 wheeled, shower chair, Grab bars, Ramped entry, and transport chair  PLOF: Independent, Independent with household mobility without device, Independent with homemaking with ambulation, Independent with gait, Independent with transfers, and she was the primary caregiver for her recently deceased daughter   CLOF: Sister provides supervision daily for pt to ambulate using RW safely and provides assist for ADLs (showering and dressing - progressing towards supervision with these activities); Sister states pt requires cues to turn and step back fully prior to  sitting to ensure her safety  PATIENT GOALS: get back to being able to go shopping with improved community level ambulation without the walker; return to driving   OBJECTIVE:  Note: Objective measures were completed at Evaluation unless otherwise noted.  DIAGNOSTIC FINDINGS:   EXAM: MRI HEAD WITHOUT CONTRAST   TECHNIQUE: Multiplanar, multiecho pulse sequences of the brain and surrounding structures were obtained without intravenous contrast.   COMPARISON:  Brain MRI 07/31/2022.  IMPRESSION: 1. Patchy acute cortical/subcortical infarcts within the left parietal and occipital lobes individually measuring up to 3 cm (MCA vascular territory). Subtle petechial hemorrhage associated with some of these infarcts. No frank hemorrhagic conversion. 2. Additional punctate left MCA territory cortical acute infarct within the left insula. 3. Background parenchymal atrophy and chronic small vessel ischemic disease with chronic lacunar infarcts, as described. 4. Paranasal sinus disease as outlined (including severe right maxillary sinusitis).   Electronically Signed   By: Rockey Childs D.O.   On: 12/20/2023 11:52  COGNITION: Overall cognitive status: Within functional limits for tasks assessed   SENSATION: Light touch: initially misses the first 1-2 touches on R LE, but then able to feel remaining touches Proprioception: Impaired grossly  COORDINATION: Symmetrical heel-to-shin bilaterally  EDEMA:  Not formally assessed, but none observed  MUSCLE TONE: Not formally assessed  POSTURE: rounded shoulders, forward head, and posterior pelvic tilt  LOWER EXTREMITY MMT:    MMT Right Eval Left Eval  Hip flexion 3+, no pain 3+ with some L hip and back pain with this  Hip extension    Hip abduction    Hip adduction    Hip internal rotation    Hip external rotation    Knee flexion 3+ 4  Knee extension 4- 4  Ankle dorsiflexion 3+ 4  Ankle plantarflexion 4- 4  Ankle inversion     Ankle eversion    (Blank rows = not tested)  BED MOBILITY:  Findings: Sit to supine need to assess Supine to sit need to assess *pt reports some difficulty with bed mobility  TRANSFERS: Sit to stand: SBA  Assistive device utilized: Environmental consultant - 2 wheeled     Stand to sit: SBA  Assistive device utilized: Environmental consultant - 2 wheeled     Chair to chair: SBA  Assistive device utilized: Environmental consultant - 2 wheeled       GAIT: Findings: Gait Characteristics: slight R ankle instability noted, step through pattern, decreased stance time- Right, decreased stride length, and decreased ankle dorsiflexion- Right, Distance walked: ~143ft, Assistive device utilized:Walker - 2 wheeled, Level of assistance: SBA and CGA, and Comments:    FUNCTIONAL TESTS:  5 times sit to stand: 38.75 seconds, using UE support 10 meter walk test: 0.48 m/s using youth RW, requires CGA for safety 6 minute walk test: Visit 2, see note Berg Balance Scale: Visit 2, see note Functional gait assessment: need to assess, when appropriate  PATIENT SURVEYS:  ABC scale 7.5%                                                                                                                             TREATMENT DATE: 04/23/2024  Unless otherwise stated, CGA was provided and gait belt donned in order to ensure pt safety   Physical Performance Test or Measurement: a  physical performance test(s) or measurement (eg,  musculoskeletal, functional capacity), with written report,  each 15 mins   Five times Sit to Stand Test (FTSS)  TIME: 12.16 sec  Cut off scores indicative of increased fall risk: >12 sec CVA, >16 sec PD, >13 sec vestibular (ANPTA Core Set of Outcome Measures for Adults with Neurologic Conditions, 2018)  10 Meter Walk Test: Patient instructed to walk 10 meters (32.8 ft) as quickly and as safely as possible at their normal speed Results: .70 m/s (14.11 seconds with RW)  Cut off scores:   Household Ambulator  < 0.4 m/s  Limited  Community Ambulator  0.4 - 0.8 m/s  [employer]  > 0.8 m/s  Increased fall risk  < 1.73m/s  Crossing a Street  >1.80m/s  MCID 0.05 m/s (small), 0.13 m/s (moderate), 0.06 m/s (significant)  (ANPTA Core Set of  Outcome Measures for Adults with Neurologic Conditions, 2018)      10 Meter Walk Test: Patient instructed to walk 10 meters (32.8 ft) as quickly and as safely as possible at their normal speed Results: .64 m/s (15.39 seconds no AD)  Cut off scores:   Household Ambulator  < 0.4 m/s  Limited Community Ambulator  0.4 - 0.8 m/s  [employer]  > 0.8 m/s  Increased fall risk  < 1.54m/s  Crossing a Street  >1.73m/s  MCID 0.05 m/s (small), 0.13 m/s (moderate), 0.06 m/s (significant)  (ANPTA Core Set of Outcome Measures for Adults with Neurologic Conditions, 2018)       TA- To improve functional movements patterns for everyday tasks    Gait x 1000 ft with 4# AW and RW    NMR: To facilitate reeducation of movement, balance, posture, coordination, and/or proprioception/kinesthetic sense. Placed basket of 20 basketballs anterior to patient on floor and had patient reach down and pick up basketball prior to basketball shot.  Following this in brief rest had patient collect all the basket balls and placed them back in basket focusing on object recognition at her feet as well as functional squatting.  PATIENT EDUCATION: Education details: PT POC, findings on assessment today, recommendation to continue using RW for all functional mobility Person educated: Patient and Caregiver [name] Education method: Explanation Education comprehension: verbalized understanding and needs further education  HOME EXERCISE PROGRAM: Access Code: [email or web address] URL: [email or web address] Date: 02/04/2024 Prepared by: Peggye Linear  Exercises - Standing March with Counter Support  - 2 x daily - 7 x weekly - 2 sets - 20 reps - Side stepping with counter support: yellow band  loop  - 2 x daily - 7 x weekly - 2 sets - 20 reps - Sit to Stand with Counter Support  - 2 x daily - 7 x weekly - 2 sets - 12 reps   GOALS: Goals reviewed with patient? Yes  SHORT TERM GOALS: Target date: 03/10/2024  Pt will be independent with HEP in order to improve strength and balance in order to decrease fall risk and improve function at home and work.  Baseline: need to initiate7/16: 2-3x week Goal status:  MET  LONG TERM GOALS: Target date: 04/21/2024  1.  Patient will complete five times sit to stand test in < 15 seconds indicating an increased LE strength and improved balance. Baseline:  38.75 seconds, using UE support, 7/16: 20.35 sec, UE support on 1st STS only 8/20:16.86 sec no UE support 9/10:12.16 sec  Goal Status: MET   2.  Patient will increase ABC scale score >80% to demonstrate better functional mobility and better confidence with ADLs.   Baseline: 7.5%, 7/16: 18.125% 8/20: 18.75% 04/1015.25% Goal status: ONGOING   3.  Patient will increase Berg Balance score by > 6 points to demonstrate decreased fall risk during functional activities. Baseline: need to assess; 01/30/2024= 34/56, 7/16= 39 8/20:42 Goal status: MET   4. Patient will increase 10 meter walk test to >1.9m/s as to improve gait speed for better community ambulation and to reduce fall risk. Baseline: 0.48 m/s using youth RW, requires CGA for safety, 7/16: 0.69 m/s 8/20:  .76 m/s 9/10:.70 m/s Goal status: ONGOING  5. Patient will increase six minute walk test distance to >1000 for progression to community ambulator and improve gait ability Baseline: need to assess; 01/30/2024=410 feet using RW; 02/13/24: 484ft in 37m30s, 7/16: 684ft using 2WW 8/20: 900 ft with RW  Goal status: ONGOING  6.  Patient will improve dynamic gait index score by 4 points or greater to indicate improvement in gait with a rolling walker as well as decreased risk for falls Baseline: test next visit Goal status: INITIAL     ASSESSMENT:  CLINICAL IMPRESSION:    Patient presents for re certification note this date. Pt shows great improvement in her function during this certification period. Pt has made progress with her gait speed, balance and endurance.  Patient has met her initial Berg balance test goal as well as met her initial 5 times at this test goal showing movement and balance and improve lower extremity strength and power respectively as well as decreased risk of falls aggregately.  Patient has also been steadily progressing with her endurance as well as with her gait speed although did have mild reduction in gait speed compared to last time it was tested this date.  Patient is also been showing improvement in her ability to ambulate without assistive device although this is not reflected in goal assessments this date.  Patient activity specific balance score is not reflective of her full capabilities but patient does report her confidence is very low even if she is able to do some things within the clinic.  Will make appointment to address her complex future sessions and encouraged her with her successes. Pt will continue to benefit from skilled physical therapy intervention to address impairments, improve QOL, and attain therapy goals. Patient's condition has the potential to improve in response to therapy. Maximum improvement is yet to be obtained. The anticipated improvement is attainable and reasonable in a generally predictable time.     OBJECTIVE IMPAIRMENTS: Abnormal gait, decreased activity tolerance, decreased balance, decreased endurance, decreased knowledge of use of DME, decreased mobility, difficulty walking, decreased strength, decreased safety awareness, impaired vision/preception, and pain.   ACTIVITY LIMITATIONS: carrying, lifting, bending, standing, squatting, stairs, transfers, bed mobility, bathing, toileting, dressing, reach over head, hygiene/grooming, and locomotion level  PARTICIPATION  LIMITATIONS: meal prep, cleaning, laundry, driving, shopping, and community activity  PERSONAL FACTORS: Age, Time since onset of injury/illness/exacerbation, and 3+ comorbidities: Hypertension, B12 deficiency, type 2 diabetes, hyperlipidemia, CKD stage IIIa are also affecting patient's functional outcome.   REHAB POTENTIAL: Good  CLINICAL DECISION MAKING: Evolving/moderate complexity  EVALUATION COMPLEXITY: Moderate  PLAN:  PT FREQUENCY: 1-2x/week  PT DURATION: 12 weeks  PLANNED INTERVENTIONS: 97164- PT Re-evaluation, 97750- Physical Performance Testing, 97110-Therapeutic exercises, 97530- Therapeutic activity, [id number]- Neuromuscular re-education, 97535- Self Care, [id number]- Manual therapy, [id number]- Gait training, [phone]- Orthotic Initial, [phone]- Orthotic/Prosthetic subsequent, [phone]- Canalith repositioning, [phone]- Electrical stimulation (manual), Patient/Family education, Balance training, Stair training, Taping, Joint mobilization, Spinal mobilization, Vestibular training, Visual/preceptual remediation/compensation, DME instructions, Cryotherapy, Moist heat, and Biofeedback  PLAN FOR NEXT SESSION:  -DGI NEXT VISIT  - floor transfers training - continue HIIT - continue gait training without AD for balance and righting (include multi plane, multidirectional)  - reaching to floor to pick things up without UE assist for improvement in functional activities.  -DGI NEXT VISIT   Note: Portions of this document were prepared using Dragon voice recognition software and although reviewed may contain unintentional dictation errors in syntax, grammar, or spelling.  Lonni KATHEE Gainer PT ,DPT Physical Therapist- [place]  [facility]    5:22 PM, 04/23/24

## 2024-04-28 ENCOUNTER — Ambulatory Visit: Admitting: Physical Therapy

## 2024-04-28 ENCOUNTER — Ambulatory Visit: Admitting: Speech Pathology

## 2024-04-28 ENCOUNTER — Encounter: Payer: Self-pay | Admitting: Physical Therapy

## 2024-04-28 ENCOUNTER — Ambulatory Visit: Admitting: Occupational Therapy

## 2024-04-28 DIAGNOSIS — H543 Unqualified visual loss, both eyes: Secondary | ICD-10-CM

## 2024-04-28 DIAGNOSIS — R2681 Unsteadiness on feet: Secondary | ICD-10-CM

## 2024-04-28 DIAGNOSIS — M6281 Muscle weakness (generalized): Secondary | ICD-10-CM | POA: Diagnosis not present

## 2024-04-28 DIAGNOSIS — R269 Unspecified abnormalities of gait and mobility: Secondary | ICD-10-CM

## 2024-04-28 DIAGNOSIS — I69351 Hemiplegia and hemiparesis following cerebral infarction affecting right dominant side: Secondary | ICD-10-CM

## 2024-04-28 NOTE — Therapy (Signed)
 OUTPATIENT PHYSICAL THERAPY TREATMENT   Patient Name: Haley Jones MRN: [id number] DOB:May 22, 1956, 68 y.o., female Today's Date: 04/28/2024  PCP: Dr. Oneil Pinal REFERRING PROVIDER: Toribio Pitch, PA-C  END OF SESSION:   PT End of Session - 04/28/24 1352     Visit Number 26    Number of Visits 40    Date for PT Re-Evaluation 06/18/24    Authorization Type [employer]    Progress Note Due on Visit 30    PT Start Time 1400    PT Stop Time 1442    PT Time Calculation (min) 42 min    Equipment Utilized During Treatment Gait belt    Activity Tolerance Patient tolerated treatment well    Behavior During Therapy WFL for tasks assessed/performed                  Past Medical History:  Diagnosis Date   Adnexal mass    Anxiety    Chronic back pain    Depression    Diabetes mellitus without complication (HCC)    Endometriosis 03/12/2018   GERD (gastroesophageal reflux disease)    Heart murmur 02/2018   undetected until she was an adult. 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   Weight loss 12/06/2017   Past Surgical History:  Procedure Laterality Date   ABDOMINAL HYSTERECTOMY  2008   ovaries also   CESAREAN SECTION  1994   COLONOSCOPY     COLONOSCOPY WITH PROPOFOL  N/A 02/12/2018   Procedure: COLONOSCOPY WITH PROPOFOL ;  Surgeon: Toledo, Ladell POUR, MD;  Location: [facility];  Service: Gastroenterology;  Laterality: N/A;   ESOPHAGOGASTRODUODENOSCOPY (EGD) WITH PROPOFOL  N/A 02/12/2018   Procedure: ESOPHAGOGASTRODUODENOSCOPY (EGD) WITH PROPOFOL ;  Surgeon: Toledo, Ladell POUR, MD;  Location: [facility];  Service: Gastroenterology;  Laterality: N/A;   EYE SURGERY Left 1973   muscle shortening to straighten cross eyes   LAPAROSCOPY N/A 02/20/2018   Procedure: LAPAROSCOPY DIAGNOSTIC, ( RESECTION OF RIGHT LOWER QUADRANT ABDOMINAL MASS);  Surgeon: Nicholaus Selinda Birmingham, MD;   Location: [facility];  Service: General;  Laterality: N/A;   ROTATOR CUFF REPAIR Left 2011   Patient Active Problem List   Diagnosis Date Noted   Cognitive communication deficit 04/07/2024   Aphasia 04/07/2024   Urinary incontinence as sequela of cerebrovascular accident (CVA) 02/11/2024   Depression with anxiety 01/04/2024   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   Acute CVA (cerebrovascular accident) (HCC) 12/20/2023   Primary hypertension 12/20/2023   Acute kidney injury superimposed on chronic kidney disease (HCC) 12/20/2023   DM type 2 with diabetic mixed hyperlipidemia (HCC) 11/17/2022   Major depressive disorder, recurrent, 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/17/2022   B12 deficiency 02/23/2021   SBO (small bowel obstruction) (HCC) 01/29/2019   Barrett's esophagus without dysplasia 04/15/2018   Weight loss 12/06/2017   Hoarse 12/06/2017   Lumbar disc disease 07/26/2017   Diabetes mellitus type 2, controlled, without complications (HCC) 02/09/2015   Surgical menopause 11/26/2014    ONSET DATE: 12/20/2023  REFERRING DIAG: P36.487 (ICD-10-CM) - Left middle cerebral artery stroke (HCC)   THERAPY DIAG:  Muscle weakness (generalized)  Unsteadiness on feet  Abnormality of gait  Low vision, both eyes  Hemiplegia and hemiparesis following cerebral infarction affecting right dominant side (HCC)  Rationale for Evaluation and Treatment: Rehabilitation  SUBJECTIVE:  SUBJECTIVE STATEMENT:   Denies any falls since last session.   Feels like she has been doing well. Walks from upstairs to clinic with RW. Went to stroke support group today.   PERTINENT HISTORY:Pt states she is not sure the exact date of when her CVA happened because she kept falling at home trying to care for herself and her daughter, but she  finally went to the hospital when her sister insisted on it. Pt states her special needs daughter, [name], passed away recently while pt was in the hospital at [facility] before she was transferred to [facility] for stroke rehabilitation.Pt's sister states pt requires supervision/cuing for ADLs  to don clothes correctly, with correct orientation (potential apraxia?) Pt/sister report pt has R side inattention.Pt reports supposedly one of her legs is shorter than the other, believes her R LE might be the shorter one. 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     PAIN:  Are you having pain? No  PRECAUTIONS: Fall RED FLAGS: None  WEIGHT BEARING RESTRICTIONS: No FALLS: Has patient fallen in last 6 months? Yes. Number of falls 6+ before going to hospital and 1 fall since being home from CIR - pt states she was trying to gather her clothes by herself rather than waiting for her sister to get there to help her  LIVING ENVIRONMENT Lives with: lives alone and but pt's sister, [name], comes over daily to assist with ADLs and stays to provide supervision all day - pt states at night she would be able to walk to bathroom using RW if needed (but she wears depends in event of incontinence) Lives in: House/apartment Stairs: she has steps, but she doesn't have to use them (house was wheelchair accessible for her daughter)  Has following equipment at home: Environmental consultant - 2 wheeled, shower chair, Grab bars, Ramped entry, and transport chair  PLOF: Independent, Independent with household mobility without device, Independent with homemaking with ambulation, Independent with gait, Independent with transfers, and she was the primary caregiver for her recently deceased daughter   CLOF: Sister provides supervision daily for pt to ambulate using RW safely and provides assist for ADLs (showering and dressing - progressing towards supervision with these activities); Sister  states pt requires cues to turn and step back fully prior to sitting to ensure her safety  PATIENT GOALS: get back to being able to go shopping with improved community level ambulation without the walker; return to driving   OBJECTIVE:  Note: Objective measures were completed at Evaluation unless otherwise noted.  DIAGNOSTIC FINDINGS:   EXAM: MRI HEAD WITHOUT CONTRAST   TECHNIQUE: Multiplanar, multiecho pulse sequences of the brain and surrounding structures were obtained without intravenous contrast.   COMPARISON:  Brain MRI 07/31/2022.  IMPRESSION: 1. Patchy acute cortical/subcortical infarcts within the left parietal and occipital lobes individually measuring up to 3 cm (MCA vascular territory). Subtle petechial hemorrhage associated with some of these infarcts. No frank hemorrhagic conversion. 2. Additional punctate left MCA territory cortical acute infarct within the left insula. 3. Background parenchymal atrophy and chronic small vessel ischemic disease with chronic lacunar infarcts, as described. 4. Paranasal sinus disease as outlined (including severe right maxillary sinusitis).   Electronically Signed   By: Rockey Childs D.O.   On: 12/20/2023 11:52  COGNITION: Overall cognitive status: Within functional limits for tasks assessed   SENSATION: Light touch: initially misses the first 1-2 touches on R LE, but then able to  feel remaining touches Proprioception: Impaired grossly  COORDINATION: Symmetrical heel-to-shin bilaterally  EDEMA:  Not formally assessed, but none observed  MUSCLE TONE: Not formally assessed  POSTURE: rounded shoulders, forward head, and posterior pelvic tilt  LOWER EXTREMITY MMT:    MMT Right Eval Left Eval  Hip flexion 3+, no pain 3+ with some L hip and back pain with this  Hip extension    Hip abduction    Hip adduction    Hip internal rotation    Hip external rotation    Knee flexion 3+ 4  Knee extension 4- 4  Ankle  dorsiflexion 3+ 4  Ankle plantarflexion 4- 4  Ankle inversion    Ankle eversion    (Blank rows = not tested)  BED MOBILITY:  Findings: Sit to supine need to assess Supine to sit need to assess *pt reports some difficulty with bed mobility  TRANSFERS: Sit to stand: SBA  Assistive device utilized: Environmental consultant - 2 wheeled     Stand to sit: SBA  Assistive device utilized: Environmental consultant - 2 wheeled     Chair to chair: SBA  Assistive device utilized: Environmental consultant - 2 wheeled       GAIT: Findings: Gait Characteristics: slight R ankle instability noted, step through pattern, decreased stance time- Right, decreased stride length, and decreased ankle dorsiflexion- Right, Distance walked: ~17ft, Assistive device utilized:Walker - 2 wheeled, Level of assistance: SBA and CGA, and Comments:    FUNCTIONAL TESTS:  5 times sit to stand: 38.75 seconds, using UE support 10 meter walk test: 0.48 m/s using youth RW, requires CGA for safety 6 minute walk test: Visit 2, see note Berg Balance Scale: Visit 2, see note Functional gait assessment: need to assess, when appropriate  PATIENT SURVEYS:  ABC scale 7.5%                                                                                                                             TREATMENT DATE: 04/28/2024  Unless otherwise stated, CGA was provided and gait belt donned in order to ensure pt safety   TA- To improve functional movements patterns for everyday tasks   Gait with 4WW form clinic x 16 min with 2.5# AW on outdoor hills. Pt occasionally kicks Rolator with turns   Sidestepping x 2 x 20 ft, cues keeping line for external cue   NMR: To facilitate reeducation of movement, balance, posture, coordination, and/or proprioception/kinesthetic sense. Placed basket of 20 basketballs anterior to patient on floor and had patient reach down and pick up basketball prior to basketball shot.  Following this in brief rest had patient collect all the basket balls and placed  them back in basket focusing on object recognition at her feet as well as functional squatting.  PATIENT EDUCATION: Education details: PT POC, findings on assessment today, recommendation to continue using RW for all functional mobility Person educated: Patient and Caregiver [name] Education method: Explanation Education comprehension: verbalized understanding and needs further  education  HOME EXERCISE PROGRAM: Access Code: [email or web address] URL: [email or web address] Date: 02/04/2024 Prepared by: Peggye Linear  Exercises - Standing March with Counter Support  - 2 x daily - 7 x weekly - 2 sets - 20 reps - Side stepping with counter support: yellow band loop  - 2 x daily - 7 x weekly - 2 sets - 20 reps - Sit to Stand with Counter Support  - 2 x daily - 7 x weekly - 2 sets - 12 reps   GOALS: Goals reviewed with patient? Yes  SHORT TERM GOALS: Target date: 03/10/2024  Pt will be independent with HEP in order to improve strength and balance in order to decrease fall risk and improve function at home and work.  Baseline: need to initiate7/16: 2-3x week Goal status:  MET  LONG TERM GOALS: Target date: 06/19/2024  1.  Patient will complete five times sit to stand test in < 15 seconds indicating an increased LE strength and improved balance. Baseline:  38.75 seconds, using UE support, 7/16: 20.35 sec, UE support on 1st STS only 8/20:16.86 sec no UE support 9/10:12.16 sec  Goal Status: MET   2.  Patient will increase ABC scale score >50% to demonstrate better functional mobility and better confidence with ADLs.   Baseline: 7.5%, 7/16: 18.125% 8/20: 18.75% 9/10: 16.25% Goal status: ONGOING/ REGRESSED/ ALTERED   3.  Patient will increase Berg Balance score by > 6 points to demonstrate decreased fall risk during functional activities. Baseline: need to assess; 01/30/2024= 34/56, 7/16= 39 8/20:42 Goal status: MET   4. Patient will increase 10 meter walk test to >1.52m/s as to improve  gait speed for better community ambulation and to reduce fall risk. Baseline: 0.48 m/s using youth RW, requires CGA for safety, 7/16: 0.69 m/s 8/20:  .76 m/s 9/10:.70 m/s Goal status: ONGOING  5. Patient will increase six minute walk test distance to >1000 for progression to community ambulator and improve gait ability Baseline: need to assess; 01/30/2024=410 feet using RW; 02/13/24: 439ft in 62m30s, 7/16: 673ft using 2WW 8/20: 900 ft with RW  Goal status: ONGOING  6.  Patient will improve dynamic gait index score by 4 points or greater to indicate improvement in gait with a rolling walker as well as decreased risk for falls Baseline: test next visit Goal status: INITIAL  7.  Pt will report 70% or greater confidence when ambulating around her home and completing her usual daily activities with LRAD.  Baseline: 30-50%  Goal status: INITIAL     ASSESSMENT:  CLINICAL IMPRESSION:    Patient presents with good motivation for completion of physical therapy activities.  Patient continuing to show good progress with outdoor ambulation utilizing rollator this date compared to standard walker.  Patient encouraged to find her mother or mother-in-law's previous rollator that she has stored at a family ember's house and bring this in for fitting and further practice to improve her overall mobility.  Patient also progressing with functional activities including picking up objects off the floor as well as sidestepping all without any upper extremity support.  Patient encouraged of the functional relation to any of the tasks we are completing in order to improve her confidence with activities at home. Pt will continue to benefit from skilled physical therapy intervention to address impairments, improve QOL, and attain therapy goals.    OBJECTIVE IMPAIRMENTS: Abnormal gait, decreased activity tolerance, decreased balance, decreased endurance, decreased knowledge of use of DME, decreased mobility, difficulty  walking, decreased strength, decreased safety  awareness, impaired vision/preception, and pain.   ACTIVITY LIMITATIONS: carrying, lifting, bending, standing, squatting, stairs, transfers, bed mobility, bathing, toileting, dressing, reach over head, hygiene/grooming, and locomotion level  PARTICIPATION LIMITATIONS: meal prep, cleaning, laundry, driving, shopping, and community activity  PERSONAL FACTORS: Age, Time since onset of injury/illness/exacerbation, and 3+ comorbidities: Hypertension, B12 deficiency, type 2 diabetes, hyperlipidemia, CKD stage IIIa are also affecting patient's functional outcome.   REHAB POTENTIAL: Good  CLINICAL DECISION MAKING: Evolving/moderate complexity  EVALUATION COMPLEXITY: Moderate  PLAN:  PT FREQUENCY: 1-2x/week  PT DURATION: 8 weeks  PLANNED INTERVENTIONS: 97164- PT Re-evaluation, 97750- Physical Performance Testing, 97110-Therapeutic exercises, 97530- Therapeutic activity, [id number]- Neuromuscular re-education, 97535- Self Care, [id number]- Manual therapy, [id number]- Gait training, [phone]- Orthotic Initial, [phone]- Orthotic/Prosthetic subsequent, [phone]- Canalith repositioning, [phone]- Electrical stimulation (manual), Patient/Family education, Balance training, Stair training, Taping, Joint mobilization, Spinal mobilization, Vestibular training, Visual/preceptual remediation/compensation, DME instructions, Cryotherapy, Moist heat, and Biofeedback  PLAN FOR NEXT SESSION:  -DGI NEXT VISIT  - floor transfers training - continue HIIT - continue gait training without AD for balance and righting (include multi plane, multidirectional)  - reaching to floor to pick things up without UE assist for improvement in functional activities.  -DGI NEXT VISIT   Note: Portions of this document were prepared using Dragon voice recognition software and although reviewed may contain unintentional dictation errors in syntax, grammar, or spelling.  Lonni KATHEE Gainer PT ,DPT Physical  Therapist- [place]  [facility]    1:52 PM, 04/28/24

## 2024-04-30 ENCOUNTER — Ambulatory Visit: Admitting: Speech Pathology

## 2024-04-30 ENCOUNTER — Ambulatory Visit: Admitting: Occupational Therapy

## 2024-04-30 ENCOUNTER — Ambulatory Visit: Admitting: Physical Therapy

## 2024-04-30 DIAGNOSIS — M6281 Muscle weakness (generalized): Secondary | ICD-10-CM | POA: Diagnosis not present

## 2024-04-30 DIAGNOSIS — H543 Unqualified visual loss, both eyes: Secondary | ICD-10-CM

## 2024-04-30 DIAGNOSIS — R2681 Unsteadiness on feet: Secondary | ICD-10-CM

## 2024-04-30 DIAGNOSIS — R269 Unspecified abnormalities of gait and mobility: Secondary | ICD-10-CM

## 2024-04-30 NOTE — Therapy (Signed)
 OUTPATIENT PHYSICAL THERAPY TREATMENT   Patient Name: Haley Jones MRN: [id number] DOB:1955/11/28, 68 y.o., female Today's Date: 04/30/2024  PCP: Dr. Oneil Pinal REFERRING PROVIDER: Toribio Pitch, PA-C  END OF SESSION:   PT End of Session - 04/30/24 1415     Visit Number 27    Number of Visits 40    Date for PT Re-Evaluation 06/18/24    Authorization Type [employer]    Progress Note Due on Visit 30    PT Start Time 1403    PT Stop Time 1443    PT Time Calculation (min) 40 min    Equipment Utilized During Treatment Gait belt    Activity Tolerance Patient tolerated treatment well    Behavior During Therapy WFL for tasks assessed/performed                   Past Medical History:  Diagnosis Date   Adnexal mass    Anxiety    Chronic back pain    Depression    Diabetes mellitus without complication (HCC)    Endometriosis 03/12/2018   GERD (gastroesophageal reflux disease)    Heart murmur 02/2018   undetected until she was an adult. 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   Weight loss 12/06/2017   Past Surgical History:  Procedure Laterality Date   ABDOMINAL HYSTERECTOMY  2008   ovaries also   CESAREAN SECTION  1994   COLONOSCOPY     COLONOSCOPY WITH PROPOFOL  N/A 02/12/2018   Procedure: COLONOSCOPY WITH PROPOFOL ;  Surgeon: Toledo, Ladell POUR, MD;  Location: [facility];  Service: Gastroenterology;  Laterality: N/A;   ESOPHAGOGASTRODUODENOSCOPY (EGD) WITH PROPOFOL  N/A 02/12/2018   Procedure: ESOPHAGOGASTRODUODENOSCOPY (EGD) WITH PROPOFOL ;  Surgeon: Toledo, Ladell POUR, MD;  Location: [facility];  Service: Gastroenterology;  Laterality: N/A;   EYE SURGERY Left 1973   muscle shortening to straighten cross eyes   LAPAROSCOPY N/A 02/20/2018   Procedure: LAPAROSCOPY DIAGNOSTIC, ( RESECTION OF RIGHT LOWER QUADRANT ABDOMINAL MASS);  Surgeon: Nicholaus Selinda Birmingham, MD;   Location: [facility];  Service: General;  Laterality: N/A;   ROTATOR CUFF REPAIR Left 2011   Patient Active Problem List   Diagnosis Date Noted   Cognitive communication deficit 04/07/2024   Aphasia 04/07/2024   Urinary incontinence as sequela of cerebrovascular accident (CVA) 02/11/2024   Depression with anxiety 01/04/2024   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   Acute CVA (cerebrovascular accident) (HCC) 12/20/2023   Primary hypertension 12/20/2023   Acute kidney injury superimposed on chronic kidney disease (HCC) 12/20/2023   DM type 2 with diabetic mixed hyperlipidemia (HCC) 11/17/2022   Major depressive disorder, recurrent, 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/17/2022   B12 deficiency 02/23/2021   SBO (small bowel obstruction) (HCC) 01/29/2019   Barrett's esophagus without dysplasia 04/15/2018   Weight loss 12/06/2017   Hoarse 12/06/2017   Lumbar disc disease 07/26/2017   Diabetes mellitus type 2, controlled, without complications (HCC) 02/09/2015   Surgical menopause 11/26/2014    ONSET DATE: 12/20/2023  REFERRING DIAG: P36.487 (ICD-10-CM) - Left middle cerebral artery stroke (HCC)   THERAPY DIAG:  Muscle weakness (generalized)  Unsteadiness on feet  Abnormality of gait  Low vision, both eyes  Rationale for Evaluation and Treatment: Rehabilitation  SUBJECTIVE:  SUBJECTIVE STATEMENT:   Denies any falls since last session.   Feels like she has been doing well. Walks from upstairs to clinic with [phone] and needs it adjusted.   PERTINENT HISTORY:Pt states she is not sure the exact date of when her CVA happened because she kept falling at home trying to care for herself and her daughter, but she finally went to the hospital when her sister insisted on it. Pt states her special needs daughter, [name], passed  away recently while pt was in the hospital at [facility] before she was transferred to [facility] for stroke rehabilitation.Pt's sister states pt requires supervision/cuing for ADLs  to don clothes correctly, with correct orientation (potential apraxia?) Pt/sister report pt has R side inattention.Pt reports supposedly one of her legs is shorter than the other, believes her R LE might be the shorter one. 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     PAIN:  Are you having pain? No  PRECAUTIONS: Fall RED FLAGS: None  WEIGHT BEARING RESTRICTIONS: No FALLS: Has patient fallen in last 6 months? Yes. Number of falls 6+ before going to hospital and 1 fall since being home from CIR - pt states she was trying to gather her clothes by herself rather than waiting for her sister to get there to help her  LIVING ENVIRONMENT Lives with: lives alone and but pt's sister, [name], comes over daily to assist with ADLs and stays to provide supervision all day - pt states at night she would be able to walk to bathroom using RW if needed (but she wears depends in event of incontinence) Lives in: House/apartment Stairs: she has steps, but she doesn't have to use them (house was wheelchair accessible for her daughter)  Has following equipment at home: Environmental consultant - 2 wheeled, shower chair, Grab bars, Ramped entry, and transport chair  PLOF: Independent, Independent with household mobility without device, Independent with homemaking with ambulation, Independent with gait, Independent with transfers, and she was the primary caregiver for her recently deceased daughter   CLOF: Sister provides supervision daily for pt to ambulate using RW safely and provides assist for ADLs (showering and dressing - progressing towards supervision with these activities); Sister states pt requires cues to turn and step back fully prior to sitting to ensure her safety  PATIENT GOALS: get  back to being able to go shopping with improved community level ambulation without the walker; return to driving   OBJECTIVE:  Note: Objective measures were completed at Evaluation unless otherwise noted.  DIAGNOSTIC FINDINGS:   EXAM: MRI HEAD WITHOUT CONTRAST   TECHNIQUE: Multiplanar, multiecho pulse sequences of the brain and surrounding structures were obtained without intravenous contrast.   COMPARISON:  Brain MRI 07/31/2022.  IMPRESSION: 1. Patchy acute cortical/subcortical infarcts within the left parietal and occipital lobes individually measuring up to 3 cm (MCA vascular territory). Subtle petechial hemorrhage associated with some of these infarcts. No frank hemorrhagic conversion. 2. Additional punctate left MCA territory cortical acute infarct within the left insula. 3. Background parenchymal atrophy and chronic small vessel ischemic disease with chronic lacunar infarcts, as described. 4. Paranasal sinus disease as outlined (including severe right maxillary sinusitis).   Electronically Signed   By: Rockey Childs D.O.   On: 12/20/2023 11:52  COGNITION: Overall cognitive status: Within functional limits for tasks assessed   SENSATION: Light touch: initially misses the first 1-2 touches on R LE, but then able to feel remaining  touches Proprioception: Impaired grossly  COORDINATION: Symmetrical heel-to-shin bilaterally  EDEMA:  Not formally assessed, but none observed  MUSCLE TONE: Not formally assessed  POSTURE: rounded shoulders, forward head, and posterior pelvic tilt  LOWER EXTREMITY MMT:    MMT Right Eval Left Eval  Hip flexion 3+, no pain 3+ with some L hip and back pain with this  Hip extension    Hip abduction    Hip adduction    Hip internal rotation    Hip external rotation    Knee flexion 3+ 4  Knee extension 4- 4  Ankle dorsiflexion 3+ 4  Ankle plantarflexion 4- 4  Ankle inversion    Ankle eversion    (Blank rows = not tested)  BED  MOBILITY:  Findings: Sit to supine need to assess Supine to sit need to assess *pt reports some difficulty with bed mobility  TRANSFERS: Sit to stand: SBA  Assistive device utilized: Environmental consultant - 2 wheeled     Stand to sit: SBA  Assistive device utilized: Environmental consultant - 2 wheeled     Chair to chair: SBA  Assistive device utilized: Environmental consultant - 2 wheeled       GAIT: Findings: Gait Characteristics: slight R ankle instability noted, step through pattern, decreased stance time- Right, decreased stride length, and decreased ankle dorsiflexion- Right, Distance walked: ~118ft, Assistive device utilized:Walker - 2 wheeled, Level of assistance: SBA and CGA, and Comments:    FUNCTIONAL TESTS:  5 times sit to stand: 38.75 seconds, using UE support 10 meter walk test: 0.48 m/s using youth RW, requires CGA for safety 6 minute walk test: Visit 2, see note Berg Balance Scale: Visit 2, see note Functional gait assessment: need to assess, when appropriate  PATIENT SURVEYS:  ABC scale 7.5%                                                                                                                             TREATMENT DATE: 04/30/2024  Unless otherwise stated, CGA was provided and gait belt donned in order to ensure pt safety   Physical Performance Test or Measurement: a  physical performance test(s) or measurement (eg,  musculoskeletal, functional capacity), with written report,  each 15 mins    PT instructed pt in DGI. See below for results. Demonstrates increased fall risk with score of 10/24. (<19 indicates increased fall risk)   [facility] PT Assessment - 04/30/24 0001       Standardized Balance Assessment   Standardized Balance Assessment Dynamic Gait Index      Dynamic Gait Index   Level Surface Mild Impairment    Change in Gait Speed Mild Impairment    Gait with Horizontal Head Turns Mild Impairment    Gait with Vertical Head Turns Moderate Impairment    Gait and Pivot Turn Mild Impairment    Step  Over Obstacle Severe Impairment    Step Around Obstacles Severe Impairment    Steps Moderate Impairment  Total Score 10          Self Care:   Assisted pt with adjusting walker she got from her brother's house to fit her appropriately.   TA- To improve functional movements patterns for everyday tasks   Gait with 4WW form clinic x 15 min with 2.5# AW on outdoor hills. Pt occasionally kicks Rolator with turns but much less frequently after cues to focus on this when turning  Sidestepping x 10 ft ea direction then retro and forward stepping same distance   NMR: To facilitate reeducation of movement, balance, posture, coordination, and/or proprioception/kinesthetic sense.  Airex adducted stance 2 x 30 sec   Step tap to 6 in step from airex with UE support 2 x 10 ea LE   PATIENT EDUCATION: Education details: PT POC, findings on assessment today, recommendation to continue using RW for all functional mobility Person educated: Patient and Caregiver [name] Education method: Explanation Education comprehension: verbalized understanding and needs further education  HOME EXERCISE PROGRAM: Access Code: [email or web address] URL: [email or web address] Date: 02/04/2024 Prepared by: Peggye Linear  Exercises - Standing March with Counter Support  - 2 x daily - 7 x weekly - 2 sets - 20 reps - Side stepping with counter support: yellow band loop  - 2 x daily - 7 x weekly - 2 sets - 20 reps - Sit to Stand with Counter Support  - 2 x daily - 7 x weekly - 2 sets - 12 reps   GOALS: Goals reviewed with patient? Yes  SHORT TERM GOALS: Target date: 03/10/2024  Pt will be independent with HEP in order to improve strength and balance in order to decrease fall risk and improve function at home and work.  Baseline: need to initiate7/16: 2-3x week Goal status:  MET  LONG TERM GOALS: Target date: 06/19/2024  1.  Patient will complete five times sit to stand test in < 15 seconds indicating an  increased LE strength and improved balance. Baseline:  38.75 seconds, using UE support, 7/16: 20.35 sec, UE support on 1st STS only 8/20:16.86 sec no UE support 9/10:12.16 sec  Goal Status: MET   2.  Patient will increase ABC scale score >50% to demonstrate better functional mobility and better confidence with ADLs.   Baseline: 7.5%, 7/16: 18.125% 8/20: 18.75% 9/10: 16.25% Goal status: ONGOING/ REGRESSED/ ALTERED   3.  Patient will increase Berg Balance score by > 6 points to demonstrate decreased fall risk during functional activities. Baseline: need to assess; 01/30/2024= 34/56, 7/16= 39 8/20:42 Goal status: MET   4. Patient will increase 10 meter walk test to >1.70m/s as to improve gait speed for better community ambulation and to reduce fall risk. Baseline: 0.48 m/s using youth RW, requires CGA for safety, 7/16: 0.69 m/s 8/20:  .76 m/s 9/10:.70 m/s Goal status: ONGOING  5. Patient will increase six minute walk test distance to >1000 for progression to community ambulator and improve gait ability Baseline: need to assess; 01/30/2024=410 feet using RW; 02/13/24: 479ft in 12m30s, 7/16: 69ft using 2WW 8/20: 900 ft with RW  Goal status: ONGOING  6.  Patient will improve dynamic gait index score by 4 points or greater to indicate improvement in gait with a rolling walker as well as decreased risk for falls Baseline: test next visit Goal status: INITIAL  7.  Pt will report 70% or greater confidence when ambulating around her home and completing her usual daily activities with LRAD.  Baseline: 30-50%  Goal status: INITIAL  ASSESSMENT:  CLINICAL IMPRESSION:    Patient presents with good motivation for completion of physical therapy activities.  Patient continuing to show good progress with outdoor ambulation utilizing rollator this date compared to standard walker.  Pt gait with rollator is improved in quality and speed compared to 2WW. Pt also feels more confidence with the 4WW as an AD.   Patient encouraged of the functional relation to any of the tasks we are completing in order to improve her confidence with activities at home. Pt will continue to benefit from skilled physical therapy intervention to address impairments, improve QOL, and attain therapy goals.    OBJECTIVE IMPAIRMENTS: Abnormal gait, decreased activity tolerance, decreased balance, decreased endurance, decreased knowledge of use of DME, decreased mobility, difficulty walking, decreased strength, decreased safety awareness, impaired vision/preception, and pain.   ACTIVITY LIMITATIONS: carrying, lifting, bending, standing, squatting, stairs, transfers, bed mobility, bathing, toileting, dressing, reach over head, hygiene/grooming, and locomotion level  PARTICIPATION LIMITATIONS: meal prep, cleaning, laundry, driving, shopping, and community activity  PERSONAL FACTORS: Age, Time since onset of injury/illness/exacerbation, and 3+ comorbidities: Hypertension, B12 deficiency, type 2 diabetes, hyperlipidemia, CKD stage IIIa are also affecting patient's functional outcome.   REHAB POTENTIAL: Good  CLINICAL DECISION MAKING: Evolving/moderate complexity  EVALUATION COMPLEXITY: Moderate  PLAN:  PT FREQUENCY: 1-2x/week  PT DURATION: 8 weeks  PLANNED INTERVENTIONS: [id number]- PT Re-evaluation, 97750- Physical Performance Testing, 97110-Therapeutic exercises, 97530- Therapeutic activity, [id number]- Neuromuscular re-education, 97535- Self Care, [id number]- Manual therapy, [id number]- Gait training, [phone]- Orthotic Initial, [phone]- Orthotic/Prosthetic subsequent, [phone]- Canalith repositioning, [phone]- Electrical stimulation (manual), Patient/Family education, Balance training, Stair training, Taping, Joint mobilization, Spinal mobilization, Vestibular training, Visual/preceptual remediation/compensation, DME instructions, Cryotherapy, Moist heat, and Biofeedback  PLAN FOR NEXT SESSION:    - floor transfers training - continue HIIT -  continue gait training without AD for balance and righting (include multi plane, multidirectional)  - reaching to floor to pick things up without UE assist for improvement in functional activities.    Note: Portions of this document were prepared using Dragon voice recognition software and although reviewed may contain unintentional dictation errors in syntax, grammar, or spelling.  Lonni KATHEE Gainer PT ,DPT Physical Therapist- [place]  [facility]    2:15 PM, 04/30/24

## 2024-05-01 NOTE — Therapy (Signed)
 Occupational Therapy Neuro Treatment Note   Patient Name: Haley Jones MRN: [id number] DOB:03-22-1956, 68 y.o., female Today's Date: 05/01/2024  PCP: Cleotilde Oneil FALCON, MD REFERRING PROVIDER: Pegge Toribio PARAS, PA-C   OT End of Session - 05/01/24 0830     Visit Number 24    Number of Visits 48    Date for OT Re-Evaluation 06/25/24    OT Start Time 1445    OT Stop Time 1530    OT Time Calculation (min) 45 min    Activity Tolerance Patient tolerated treatment well    Behavior During Therapy [facility] for tasks assessed/performed                  Past Medical History:  Diagnosis Date   Adnexal mass    Anxiety    Chronic back pain    Depression    Diabetes mellitus without complication (HCC)    Endometriosis 03/12/2018   GERD (gastroesophageal reflux disease)    Heart murmur 02/2018   undetected until she was an adult. 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   Weight loss 12/06/2017   Past Surgical History:  Procedure Laterality Date   ABDOMINAL HYSTERECTOMY  2008   ovaries also   CESAREAN SECTION  1994   COLONOSCOPY     COLONOSCOPY WITH PROPOFOL  N/A 02/12/2018   Procedure: COLONOSCOPY WITH PROPOFOL ;  Surgeon: Toledo, Ladell POUR, MD;  Location: [facility] ENDOSCOPY;  Service: Gastroenterology;  Laterality: N/A;   ESOPHAGOGASTRODUODENOSCOPY (EGD) WITH PROPOFOL  N/A 02/12/2018   Procedure: ESOPHAGOGASTRODUODENOSCOPY (EGD) WITH PROPOFOL ;  Surgeon: Toledo, Ladell POUR, MD;  Location: [facility] ENDOSCOPY;  Service: Gastroenterology;  Laterality: N/A;   EYE SURGERY Left 1973   muscle shortening to straighten cross eyes   LAPAROSCOPY N/A 02/20/2018   Procedure: LAPAROSCOPY DIAGNOSTIC, ( RESECTION OF RIGHT LOWER QUADRANT ABDOMINAL MASS);  Surgeon: Nicholaus Selinda Birmingham, MD;  Location: [facility] ORS;  Service: General;  Laterality: N/A;   ROTATOR CUFF REPAIR Left 2011   Patient Active Problem List    Diagnosis Date Noted   Depression with anxiety 01/04/2024   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   Acute CVA (cerebrovascular accident) (HCC) 12/20/2023   Primary hypertension 12/20/2023   Acute kidney injury superimposed on chronic kidney disease (HCC) 12/20/2023   DM type 2 with diabetic mixed hyperlipidemia (HCC) 11/17/2022   Major depressive disorder, recurrent, 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  Rationale for Evaluation and Treatment: Rehabilitation  SUBJECTIVE:   SUBJECTIVE STATEMENT:  Pt. Reports doing well today. Pt accompanied by: self  PERTINENT HISTORY: Pt. Is a 68 yo female with hx of a L MCA CVA. Pt. Was admitted into ED on 12/20/2023 with an onset of RUE weakness, numbness dysmetria, and visual deficits. Upon assessment, it was concluded that Pt. Experienced multiple infarcts in the L Parietal/Occipital region. PMHx: Dizziness, Falls  PRECAUTIONS: None  WEIGHT BEARING RESTRICTIONS: No  PAIN:  Are you having pain?  no  04/23/24: No pain 04/16/24: No pain 04/02/24: No pain 03/12/2024:  6/10 pain at the R dorsal forearm during gross grip strengthening task. 03/10/2024: No pain today. 03/05/2024: No pain, 2-3/10 level of discomfort during use of green resisitve clips.    FALLS: Has patient fallen in last 6 months? Yes. Number of falls 10  LIVING ENVIRONMENT: Lives with: lives with their family and lives alone Lives in: House/apartment Stairs: No, Ramp Has following equipment at home: Walker - 2 wheeled, Shower bench, and bed side commode  PLOF:  Independent  PATIENT GOALS: To be able to feed herself and hold a fork and knife better.   OBJECTIVE:  Note: Objective measures were completed at Evaluation unless otherwise noted.  HAND DOMINANCE: Left  ADLs: Overall ADLs:  Transfers/ambulation related to ADLs:  Eating: Independent  Grooming: independent UB Dressing: Pt. Has difficulty managing buttons LB Dressing: jean zippers are difficult to manipulate, hiking pants, and clothing negotiation are difficult. Toileting: Independent Bathing: Independent Tub Shower transfers: stepping into shower is difficult.  Equipment: Grab bars, shower bench, bedside commode  IADLs: Shopping: Does not currently do that/has not tried Light housekeeping: Does not currently do that/has not tried Meal Prep: Has not cooked in months, did not prior to CVA, sister provides meals. Community mobility: Relies on family, and friends Medication management: Assistance from sister to initiate medication management, she gives herself a shot for diabetes Financial management: Sister is assisting with monthly bill management Handwriting: Cursive form is 10% legible; Printed form is 90% legible  MOBILITY STATUS: Needs Assist: CGA and Hx of falls  POSTURE COMMENTS:  No Significant postural limitations Sitting balance: Good  ACTIVITY TOLERANCE: Activity tolerance: Good  FUNCTIONAL OUTCOME MEASURES:   UPPER EXTREMITY ROM:    Active ROM Right eval Right 03/03/2024 Right  04/02/24 Left Eval [facility]  Shoulder flexion 50(58) 61(74) 62(76)   Shoulder abduction 43(48) 45(58) 52(58)   Shoulder adduction      Shoulder extension      Shoulder internal rotation      Shoulder external rotation      Elbow flexion 118(131) 125(128) 142(144)   Elbow extension -24(-22) -5(0) 0(0)   Wrist flexion 20 (21) 42(48) 64(72)   Wrist extension 30(31) 30(34) 52(58)   Wrist ulnar deviation      Wrist radial deviation      Wrist pronation      Wrist supination       (Blank rows = not tested)  UPPER EXTREMITY MMT:     MMT Right eval Right 03/03/2024 Right 04/02/24 Left eval  Shoulder flexion 2/5 2/5 2/5 WFL  Shoulder abduction 2/5 2/5 2/5 [facility]  Shoulder adduction      Shoulder extension      Shoulder internal rotation      Shoulder external rotation      Middle trapezius      Lower trapezius      Elbow flexion 4-/5 4/5 4/5 WFL  Elbow extension 4-/5 4/5 4/5 WFL  Wrist flexion 2+/5 4-/5 4-/5 WFL  Wrist extension 3-/5 4-/5 4-/5 WFL  Wrist ulnar deviation      Wrist radial deviation      Wrist pronation      Wrist supination      (Blank rows = not tested)  HAND FUNCTION: Grip strength: Right: 21 lbs; Left: 14 lbs, Lateral pinch: Right: 7 lbs, Left: 4 lbs, and 3 point pinch: Right: 4 lbs, Left: 5 lbs  03/03/2024:  Grip strength: Right: 23#, Left: 28#, Lateral pinch: Right: 6#, Left: 5#, 3 point pinch: Right: 6#, Left:  3#  04/02/24:  Grip strength: Right: 31#, Left: 35#, Lateral pinch: Right: 8#, Left: 7#, 3 point pinch: Right: 8#, Left: 6#   COORDINATION: 9 Hole Peg test: Right: 72 sec; Left: 40 sec  03/03/2024: 9 hole peg test: Right: 1 min 3 sec, Left: 36 sec  04/02/24  9 hole peg test: Right: 53 sec, Left: 30 sec    SENSATION: Light touch: Impaired  Proprioception: Impaired   EDEMA: none  MUSCLE TONE: RUE: Mild  COGNITION: Overall cognitive status: Impaired  VISION: Subjective report: Pt. Has not noticed the changes in vision but her sister has. Caregiver reports that Pt. Does not bring much attention to the R side.  Baseline vision:  Visual history:   VISION ASSESSMENT: TBD   PERCEPTION: TBD  PRAXIS: Impaired: Motor planning                                                                                                                           TREATMENT DATE: 05/01/2024   There. Ex.:  Pt. Tolerated AROM followed by PROM to the end ROM for right shoulder flexion, and abduction, horizontal shoulder abduction,  and external rotation.  Therapeutic Act.:   -Facilitated Lateral, and 3pt. Pinch strengthening using yellow, red, green, and blue level resistive clips  -Facilitated functional reaching through multiple planes with the RUE to place the clips onto vertical dowels.  -Pt. Required constant consistent verbal, and tactile cues for hand, and pinch position on the resistive clips.      PATIENT EDUCATION: Education details: visual perceptual skills, visual compensatory strategies , RUE weakness, BUE functioning,  Right shoulder ROM Person educated: Patient and Sister Education method: Explanation, Demonstration, Tactile cues, and Verbal cues Education comprehension: verbalized understanding and returned demonstration  HOME EXERCISE PROGRAM: Yellow theraputty HEP   GOALS: Goals reviewed with patient? Yes  SHORT TERM GOALS: Target date: 03/10/2024    Pt. Will be independent utilizing HEPs for hand strengthening exercises.  Baseline: Eval: No current HEP program Goal status: INITIAL   LONG TERM GOALS: Target date: 04/21/2024   Pt. Will improve BUE grip strength by 5# of force to be able to open jars. Baseline: 04/02/24: Grip strength: Right: 31#, Left: 35# 03/03/2024: R grip strength: 23#, L grip strength: 28#. Pt. has difficulty securely holding items in her hand. Eval: R grip strength: 21#, L grip strength: 14# Goal status: 04/02/24 achieved; revised to open jars   2.  Pt. Will improve RUE [facility] skills by 3 sec. to be able to manipulate  small objects. Baseline: 04/02/24: 9 hole peg test: Right: 53 sec, Left: 30 sec 03/03/2024: 9 hole peg test: R side: 1 min 3 sec, L side: 36 sec. 9 hole peg test; R side: 72 secs, L side: 40 secs Goal status: progressing, revised 8/20 for manipulating small objects.  3.  Pt. Will increase R shoulder flex/shoulder ABD ROM by 10 degrees to perform ADLs/IADLs.  Baseline: 04/02/24: Right shoulder flexion: 62(76), Abduction: 52(58) 03/03/2024:  R shoulder flex:  61(74), R shoulder ABD: 45(58) R shoulder flex: 50(58), R shoulder ABD: 43(48) Goal status: progressing, ongoing  4.  Pt. Will improve handwriting to 50% legible in cursive form, legibility to be able to send written correspondence Baseline: 03/03/2024: 50% legible in cursive form, Eval: 10% legible in cursive form, 90% legible in printed form. Goal status: Discontinue goal per Pt. report that it has returned to baseline.  5.  Pt. Will improve BUE pinch strength by 2# of force to  be able to open medication bottles Baseline: 04/02/24: Lateral pinch: Right: 8#, Left: 7#, 3 point pinch: Right: 8#, Left: 6# 03/03/2024: Lateral Pinch: R: 6#, L: 5#, 3 pt pinch: R: 6#, L: 3#.  Eval: Lateral Pinch: 7, R 3 pt. Pinch: 4, L Lateral Pinch: 4, L 3 pt. Pinch: 5 Goal status:  04/02/24: Achieved; revised to be able to open medication bottles  6.  Pt. Will increase R wrist extension/flexion by 5 degrees in anticipation of reaching hold of items for ADLs. Baseline: 04/02/24: 52(58) 03/03/2024: R wrist ext: 30 (34), R Wrist flex: 42(48) Eval: R Wrist ext: 20(21), R Wrist flex: 30(31) Goal status: Achieved  ASSESSMENT:  CLINICAL IMPRESSION:  Pt. required consistent cuing for hand/pinch position on the clips, and assist proximally when reaching with the RUE to place them onto the vertical dowels. Pt. Requires visual cues, as well as cues for explanation of each of each task, as well cues for encouragement. Pt. continues to benefit from OT services to work on RUE ROM, RUE strengthening, bilateral grip strength, bilateral pinch strength, bilateral [facility] skills, and Right sided awareness and attention in order to improve functional independence of ADL/IADL tasks at home.   PERFORMANCE DEFICITS: in functional skills including ADLs, IADLs, coordination, dexterity, proprioception, sensation, tone, ROM, strength, pain, Fine motor control, Gross motor control, endurance, and UE functional use, and psychosocial skills including  environmental adaptation, habits, interpersonal interactions, and routines and behaviors.   IMPAIRMENTS: are limiting patient from ADLs, IADLs, rest and sleep, work, leisure, and social participation.   CO-MORBIDITIES: may have co-morbidities  that affects occupational performance. Patient will benefit from skilled OT to address above impairments and improve overall function.  MODIFICATION OR ASSISTANCE TO COMPLETE EVALUATION: Min-Moderate modification of tasks or assist with assess necessary to complete an evaluation.  OT OCCUPATIONAL PROFILE AND HISTORY: Detailed assessment: Review of records and additional review of physical, cognitive, psychosocial history related to current functional performance.  CLINICAL DECISION MAKING: Moderate - several treatment options, min-mod task modification necessary  REHAB POTENTIAL: Good  EVALUATION COMPLEXITY: Moderate    PLAN:  OT FREQUENCY: 1x/week  OT DURATION: 12 weeks  PLANNED INTERVENTIONS: 97535 self care/ADL training, 02889 therapeutic exercise, 97530 therapeutic activity, 97112 neuromuscular re-education, 97018 paraffin, 02989 moist heat, 97010 cryotherapy, 97034 contrast bath, 97032 electrical stimulation (manual), passive range of motion, visual/perceptual remediation/compensation, energy conservation, patient/family education, and DME and/or AE instructions  RECOMMENDED OTHER SERVICES: ST & PT  CONSULTED AND AGREED WITH PLAN OF CARE: Patient and family member/caregiver  PLAN FOR NEXT SESSION: treatment   Ndrew Creason, MS, OTR/L   05/01/2024, 8:34 AM

## 2024-05-05 ENCOUNTER — Ambulatory Visit: Admitting: Speech Pathology

## 2024-05-05 ENCOUNTER — Ambulatory Visit: Admitting: Occupational Therapy

## 2024-05-05 ENCOUNTER — Ambulatory Visit: Admitting: Physical Therapy

## 2024-05-05 DIAGNOSIS — R2681 Unsteadiness on feet: Secondary | ICD-10-CM

## 2024-05-05 DIAGNOSIS — R269 Unspecified abnormalities of gait and mobility: Secondary | ICD-10-CM

## 2024-05-05 DIAGNOSIS — M6281 Muscle weakness (generalized): Secondary | ICD-10-CM

## 2024-05-05 DIAGNOSIS — H543 Unqualified visual loss, both eyes: Secondary | ICD-10-CM

## 2024-05-05 NOTE — Therapy (Signed)
 OUTPATIENT PHYSICAL THERAPY TREATMENT   Patient Name: Haley Jones MRN: [id number] DOB:1956/01/09, 68 y.o., female Today's Date: 05/05/2024  PCP: Dr. Oneil Pinal REFERRING PROVIDER: Toribio Pitch, PA-C  END OF SESSION:   PT End of Session - 05/05/24 1431     Visit Number 28    Number of Visits 40    Date for Recertification  06/18/24    Authorization Type [employer]    Progress Note Due on Visit 30    PT Start Time 1401    PT Stop Time 1443    PT Time Calculation (min) 42 min    Equipment Utilized During Treatment Gait belt    Activity Tolerance Patient tolerated treatment well    Behavior During Therapy WFL for tasks assessed/performed                    Past Medical History:  Diagnosis Date   Adnexal mass    Anxiety    Chronic back pain    Depression    Diabetes mellitus without complication (HCC)    Endometriosis 03/12/2018   GERD (gastroesophageal reflux disease)    Heart murmur 02/2018   undetected until she was an adult. 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   Weight loss 12/06/2017   Past Surgical History:  Procedure Laterality Date   ABDOMINAL HYSTERECTOMY  2008   ovaries also   CESAREAN SECTION  1994   COLONOSCOPY     COLONOSCOPY WITH PROPOFOL  N/A 02/12/2018   Procedure: COLONOSCOPY WITH PROPOFOL ;  Surgeon: Toledo, Ladell POUR, MD;  Location: [facility];  Service: Gastroenterology;  Laterality: N/A;   ESOPHAGOGASTRODUODENOSCOPY (EGD) WITH PROPOFOL  N/A 02/12/2018   Procedure: ESOPHAGOGASTRODUODENOSCOPY (EGD) WITH PROPOFOL ;  Surgeon: Toledo, Ladell POUR, MD;  Location: [facility];  Service: Gastroenterology;  Laterality: N/A;   EYE SURGERY Left 1973   muscle shortening to straighten cross eyes   LAPAROSCOPY N/A 02/20/2018   Procedure: LAPAROSCOPY DIAGNOSTIC, ( RESECTION OF RIGHT LOWER QUADRANT ABDOMINAL MASS);  Surgeon: Nicholaus Selinda Birmingham, MD;   Location: [facility];  Service: General;  Laterality: N/A;   ROTATOR CUFF REPAIR Left 2011   Patient Active Problem List   Diagnosis Date Noted   Cognitive communication deficit 04/07/2024   Aphasia 04/07/2024   Urinary incontinence as sequela of cerebrovascular accident (CVA) 02/11/2024   Depression with anxiety 01/04/2024   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   Acute CVA (cerebrovascular accident) (HCC) 12/20/2023   Primary hypertension 12/20/2023   Acute kidney injury superimposed on chronic kidney disease (HCC) 12/20/2023   DM type 2 with diabetic mixed hyperlipidemia (HCC) 11/17/2022   Major depressive disorder, recurrent, 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   B12 deficiency 02/23/2021   SBO (small bowel obstruction) (HCC) 01/29/2019   Barrett's esophagus without dysplasia 04/15/2018   Weight loss 12/06/2017   Hoarse 12/06/2017   Lumbar disc disease 07/26/2017   Diabetes mellitus type 2, controlled, without complications (HCC) 02/09/2015   Surgical menopause 11/26/2014    ONSET DATE: 12/20/2023  REFERRING DIAG: P36.487 (ICD-10-CM) - Left middle cerebral artery stroke (HCC)   THERAPY DIAG:  Unsteadiness on feet  Abnormality of gait  Low vision, both eyes  Muscle weakness (generalized)  Rationale for Evaluation and Treatment: Rehabilitation  SUBJECTIVE:  SUBJECTIVE STATEMENT:   Denies any falls since last session.   Feels like she has been doing well. Walks from upstairs to clinic with [phone].   PERTINENT HISTORY:Pt states she is not sure the exact date of when her CVA happened because she kept falling at home trying to care for herself and her daughter, but she finally went to the hospital when her sister insisted on it. Pt states her special needs daughter, [name], passed away recently while pt was  in the hospital at [facility] before she was transferred to [facility] for stroke rehabilitation.Pt's sister states pt requires supervision/cuing for ADLs  to don clothes correctly, with correct orientation (potential apraxia?) Pt/sister report pt has R side inattention.Pt reports supposedly one of her legs is shorter than the other, believes her R LE might be the shorter one. 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     PAIN:  Are you having pain? No  PRECAUTIONS: Fall RED FLAGS: None  WEIGHT BEARING RESTRICTIONS: No FALLS: Has patient fallen in last 6 months? Yes. Number of falls 6+ before going to hospital and 1 fall since being home from CIR - pt states she was trying to gather her clothes by herself rather than waiting for her sister to get there to help her  LIVING ENVIRONMENT Lives with: lives alone and but pt's sister, [name], comes over daily to assist with ADLs and stays to provide supervision all day - pt states at night she would be able to walk to bathroom using RW if needed (but she wears depends in event of incontinence) Lives in: House/apartment Stairs: she has steps, but she doesn't have to use them (house was wheelchair accessible for her daughter)  Has following equipment at home: Environmental consultant - 2 wheeled, shower chair, Grab bars, Ramped entry, and transport chair  PLOF: Independent, Independent with household mobility without device, Independent with homemaking with ambulation, Independent with gait, Independent with transfers, and she was the primary caregiver for her recently deceased daughter   CLOF: Sister provides supervision daily for pt to ambulate using RW safely and provides assist for ADLs (showering and dressing - progressing towards supervision with these activities); Sister states pt requires cues to turn and step back fully prior to sitting to ensure her safety  PATIENT GOALS: get back to being able to go  shopping with improved community level ambulation without the walker; return to driving   OBJECTIVE:  Note: Objective measures were completed at Evaluation unless otherwise noted.  DIAGNOSTIC FINDINGS:   EXAM: MRI HEAD WITHOUT CONTRAST   TECHNIQUE: Multiplanar, multiecho pulse sequences of the brain and surrounding structures were obtained without intravenous contrast.   COMPARISON:  Brain MRI 07/31/2022.  IMPRESSION: 1. Patchy acute cortical/subcortical infarcts within the left parietal and occipital lobes individually measuring up to 3 cm (MCA vascular territory). Subtle petechial hemorrhage associated with some of these infarcts. No frank hemorrhagic conversion. 2. Additional punctate left MCA territory cortical acute infarct within the left insula. 3. Background parenchymal atrophy and chronic small vessel ischemic disease with chronic lacunar infarcts, as described. 4. Paranasal sinus disease as outlined (including severe right maxillary sinusitis).   Electronically Signed   By: Rockey Childs D.O.   On: 12/20/2023 11:52  COGNITION: Overall cognitive status: Within functional limits for tasks assessed   SENSATION: Light touch: initially misses the first 1-2 touches on R LE, but then able to feel remaining touches Proprioception: Impaired grossly  COORDINATION: Symmetrical heel-to-shin bilaterally  EDEMA:  Not formally assessed, but none observed  MUSCLE TONE: Not formally assessed  POSTURE: rounded shoulders, forward head, and posterior pelvic tilt  LOWER EXTREMITY MMT:    MMT Right Eval Left Eval  Hip flexion 3+, no pain 3+ with some L hip and back pain with this  Hip extension    Hip abduction    Hip adduction    Hip internal rotation    Hip external rotation    Knee flexion 3+ 4  Knee extension 4- 4  Ankle dorsiflexion 3+ 4  Ankle plantarflexion 4- 4  Ankle inversion    Ankle eversion    (Blank rows = not tested)  BED MOBILITY:  Findings: Sit  to supine need to assess Supine to sit need to assess *pt reports some difficulty with bed mobility  TRANSFERS: Sit to stand: SBA  Assistive device utilized: Environmental consultant - 2 wheeled     Stand to sit: SBA  Assistive device utilized: Environmental consultant - 2 wheeled     Chair to chair: SBA  Assistive device utilized: Environmental consultant - 2 wheeled       GAIT: Findings: Gait Characteristics: slight R ankle instability noted, step through pattern, decreased stance time- Right, decreased stride length, and decreased ankle dorsiflexion- Right, Distance walked: ~190ft, Assistive device utilized:Walker - 2 wheeled, Level of assistance: SBA and CGA, and Comments:    FUNCTIONAL TESTS:  5 times sit to stand: 38.75 seconds, using UE support 10 meter walk test: 0.48 m/s using youth RW, requires CGA for safety 6 minute walk test: Visit 2, see note Berg Balance Scale: Visit 2, see note Functional gait assessment: need to assess, when appropriate  PATIENT SURVEYS:  ABC scale 7.5%                                                                                                                             TREATMENT DATE: 05/05/2024  TA- To improve functional movements patterns for everyday tasks    Gait with 4WW from clinic x 23 min with 2.5# AW on outdoor hills. Tasked pt with finding way back to clinic after walking new route outside nad she successfully went the same route we came out!   Gait no AD around PT gym x 200 ft - random directions   Sidestepping 3 x 10 ft ea direction   NMR: To facilitate reeducation of movement, balance, posture, coordination, and/or proprioception/kinesthetic sense.  Activity Description: on steps one blaze pod ea, on each step pt has to tap blaze pod. Faces up steps throughout.  Activity Setting:  random Number of Pods:  4 Cycles/Sets:  3 Duration (Time or Hit Count):  2 min     PATIENT EDUCATION: Education details: PT POC, findings on assessment today, recommendation to continue using RW  for all functional mobility Person educated: Patient and Caregiver [name] Education method: Explanation Education comprehension: verbalized understanding and needs further education  HOME EXERCISE PROGRAM: Access  Code: [email or web address] URL: [email or web address] Date: 02/04/2024 Prepared by: Peggye Linear  Exercises - Standing March with Counter Support  - 2 x daily - 7 x weekly - 2 sets - 20 reps - Side stepping with counter support: yellow band loop  - 2 x daily - 7 x weekly - 2 sets - 20 reps - Sit to Stand with Counter Support  - 2 x daily - 7 x weekly - 2 sets - 12 reps   GOALS: Goals reviewed with patient? Yes  SHORT TERM GOALS: Target date: 03/10/2024  Pt will be independent with HEP in order to improve strength and balance in order to decrease fall risk and improve function at home and work.  Baseline: need to initiate7/16: 2-3x week Goal status:  MET  LONG TERM GOALS: Target date: 06/19/2024  1.  Patient will complete five times sit to stand test in < 15 seconds indicating an increased LE strength and improved balance. Baseline:  38.75 seconds, using UE support, 7/16: 20.35 sec, UE support on 1st STS only 8/20:16.86 sec no UE support 9/10:12.16 sec  Goal Status: MET   2.  Patient will increase ABC scale score >50% to demonstrate better functional mobility and better confidence with ADLs.   Baseline: 7.5%, 7/16: 18.125% 8/20: 18.75% 9/10: 16.25% Goal status: ONGOING/ REGRESSED/ ALTERED   3.  Patient will increase Berg Balance score by > 6 points to demonstrate decreased fall risk during functional activities. Baseline: need to assess; 01/30/2024= 34/56, 7/16= 39 8/20:42 Goal status: MET   4. Patient will increase 10 meter walk test to >1.64m/s as to improve gait speed for better community ambulation and to reduce fall risk. Baseline: 0.48 m/s using youth RW, requires CGA for safety, 7/16: 0.69 m/s 8/20:  .76 m/s 9/10:.70 m/s Goal status: ONGOING  5. Patient will  increase six minute walk test distance to >1000 for progression to community ambulator and improve gait ability Baseline: need to assess; 01/30/2024=410 feet using RW; 02/13/24: 467ft in 77m30s, 7/16: 689ft using 2WW 8/20: 900 ft with RW  Goal status: ONGOING  6.  Patient will improve dynamic gait index score by 4 points or greater to indicate improvement in gait with a rolling walker as well as decreased risk for falls Baseline: test next visit Goal status: INITIAL  7.  Pt will report 70% or greater confidence when ambulating around her home and completing her usual daily activities with LRAD.  Baseline: 30-50%  Goal status: INITIAL     ASSESSMENT:  CLINICAL IMPRESSION:    Patient presents with good motivation for completion of physical therapy activities.  Patient continuing to show good progress with outdoor ambulation utilizing rollator this date compared to standard walker. Pt showed improved awareness of her surroundings today showing improved ability to navigate back to clinic along exact route form outside area.   Patient encouraged of the functional relation to any of the tasks we are completing in order to improve her confidence with activities at home. Pt will continue to benefit from skilled physical therapy intervention to address impairments, improve QOL, and attain therapy goals.    OBJECTIVE IMPAIRMENTS: Abnormal gait, decreased activity tolerance, decreased balance, decreased endurance, decreased knowledge of use of DME, decreased mobility, difficulty walking, decreased strength, decreased safety awareness, impaired vision/preception, and pain.   ACTIVITY LIMITATIONS: carrying, lifting, bending, standing, squatting, stairs, transfers, bed mobility, bathing, toileting, dressing, reach over head, hygiene/grooming, and locomotion level  PARTICIPATION LIMITATIONS: meal prep, cleaning, laundry, driving, shopping, and community activity  PERSONAL  FACTORS: Age, Time since onset of  injury/illness/exacerbation, and 3+ comorbidities: Hypertension, B12 deficiency, type 2 diabetes, hyperlipidemia, CKD stage IIIa are also affecting patient's functional outcome.   REHAB POTENTIAL: Good  CLINICAL DECISION MAKING: Evolving/moderate complexity  EVALUATION COMPLEXITY: Moderate  PLAN:  PT FREQUENCY: 1-2x/week  PT DURATION: 8 weeks  PLANNED INTERVENTIONS: 97164- PT Re-evaluation, 97750- Physical Performance Testing, 97110-Therapeutic exercises, 97530- Therapeutic activity, [id number]- Neuromuscular re-education, 97535- Self Care, [id number]- Manual therapy, [id number]- Gait training, [phone]- Orthotic Initial, [phone]- Orthotic/Prosthetic subsequent, [phone]- Canalith repositioning, [phone]- Electrical stimulation (manual), Patient/Family education, Balance training, Stair training, Taping, Joint mobilization, Spinal mobilization, Vestibular training, Visual/preceptual remediation/compensation, DME instructions, Cryotherapy, Moist heat, and Biofeedback  PLAN FOR NEXT SESSION:    - floor transfers training - continue HIIT - continue gait training without AD for balance and righting (include multi plane, multidirectional)  - reaching to floor to pick things up without UE assist for improvement in functional activities.    Note: Portions of this document were prepared using Dragon voice recognition software and although reviewed may contain unintentional dictation errors in syntax, grammar, or spelling.  Lonni KATHEE Gainer PT ,DPT Physical Therapist- [place]  [facility]    2:31 PM, 05/05/24

## 2024-05-07 ENCOUNTER — Ambulatory Visit: Admitting: Occupational Therapy

## 2024-05-07 ENCOUNTER — Ambulatory Visit: Admitting: Physical Therapy

## 2024-05-07 ENCOUNTER — Ambulatory Visit: Admitting: Speech Pathology

## 2024-05-07 DIAGNOSIS — R278 Other lack of coordination: Secondary | ICD-10-CM

## 2024-05-07 DIAGNOSIS — H543 Unqualified visual loss, both eyes: Secondary | ICD-10-CM

## 2024-05-07 DIAGNOSIS — R269 Unspecified abnormalities of gait and mobility: Secondary | ICD-10-CM

## 2024-05-07 DIAGNOSIS — R2681 Unsteadiness on feet: Secondary | ICD-10-CM

## 2024-05-07 DIAGNOSIS — M6281 Muscle weakness (generalized): Secondary | ICD-10-CM

## 2024-05-07 NOTE — Therapy (Signed)
 OUTPATIENT PHYSICAL THERAPY TREATMENT   Patient Name: Haley Jones Jones MRN: [id number] DOB:[birth year], 68 y.o., female Today's Date: 05/07/2024  PCP: Dr. Oneil Jones REFERRING PROVIDER: Toribio Pitch, PA-C  END OF SESSION:   PT End of Session - 05/07/24 1403     Visit Number 29    Number of Visits 40    Date for Recertification  06/18/24    Authorization Type [employer]    Progress Note Due on Visit 30    PT Start Time 1404    PT Stop Time 1444    PT Time Calculation (min) 40 min    Equipment Utilized During Treatment Gait belt    Activity Tolerance Patient tolerated treatment well    Behavior During Therapy WFL for tasks assessed/performed                     Past Medical History:  Diagnosis Date   Adnexal mass    Anxiety    Chronic back pain    Depression    Diabetes mellitus without complication (HCC)    Endometriosis 03/12/2018   GERD (gastroesophageal reflux disease)    Heart murmur 02/2018   undetected until she was an adult. 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   Weight loss 12/06/2017   Past Surgical History:  Procedure Laterality Date   ABDOMINAL HYSTERECTOMY  2008   ovaries also   CESAREAN SECTION  1994   COLONOSCOPY     COLONOSCOPY WITH PROPOFOL  N/A 02/12/2018   Procedure: COLONOSCOPY WITH PROPOFOL ;  Surgeon: Haley Jones Jones, Haley Jones POUR, MD;  Location: [facility];  Service: Gastroenterology;  Laterality: N/A;   ESOPHAGOGASTRODUODENOSCOPY (EGD) WITH PROPOFOL  N/A 02/12/2018   Procedure: ESOPHAGOGASTRODUODENOSCOPY (EGD) WITH PROPOFOL ;  Surgeon: Haley Jones Jones, Haley Jones POUR, MD;  Location: [facility];  Service: Gastroenterology;  Laterality: N/A;   EYE SURGERY Left 1973   muscle shortening to straighten cross eyes   LAPAROSCOPY N/A 02/20/2018   Procedure: LAPAROSCOPY DIAGNOSTIC, ( RESECTION OF RIGHT LOWER QUADRANT ABDOMINAL MASS);  Surgeon: Haley Jones Selinda Birmingham,  MD;  Location: [facility];  Service: General;  Laterality: N/A;   ROTATOR CUFF REPAIR Left 2011   Patient Active Problem List   Diagnosis Date Noted   Cognitive communication deficit 04/07/2024   Aphasia 04/07/2024   Urinary incontinence as sequela of cerebrovascular accident (CVA) 02/11/2024   Depression with anxiety 01/04/2024   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   Acute CVA (cerebrovascular accident) (HCC) 12/20/2023   Primary hypertension 12/20/2023   Acute kidney injury superimposed on chronic kidney disease 12/20/2023   DM type 2 with diabetic mixed hyperlipidemia (HCC) 11/17/2022   Major depressive disorder, recurrent, 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   B12 deficiency 02/23/2021   SBO (small bowel obstruction) (HCC) 01/29/2019   Barrett's esophagus without dysplasia 04/15/2018   Weight loss 12/06/2017   Hoarse 12/06/2017   Lumbar disc disease 07/26/2017   Diabetes mellitus type 2, controlled, without complications (HCC) 02/09/2015   Surgical menopause 11/26/2014    ONSET DATE: 12/20/2023  REFERRING DIAG: P36.487 (ICD-10-CM) - Left middle cerebral artery stroke (HCC)   THERAPY DIAG:  Unsteadiness on feet  Abnormality of gait  Low vision, both eyes  Muscle weakness (generalized)  Rationale for Evaluation and Treatment: Rehabilitation  SUBJECTIVE:  SUBJECTIVE STATEMENT:   Denies any falls since last session. Feels like she has been doing well. Walks from upstairs to clinic with [phone].   PERTINENT HISTORY:Pt states she is not sure the exact date of when her CVA happened because she kept falling at home trying to care for herself and her daughter, but she finally went to the hospital when her sister insisted on it. Pt states her special needs daughter, Haley Jones, passed away recently while pt was in  the hospital at [facility] before she was transferred to [facility] for stroke rehabilitation.Pt's sister states pt requires supervision/cuing for ADLs  to don clothes correctly, with correct orientation (potential apraxia?) Pt/sister report pt has R side inattention.Pt reports supposedly one of her legs is shorter than the other, believes her R LE might be the shorter one. 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     PAIN:  Are you having pain? No  PRECAUTIONS: Fall RED FLAGS: None  WEIGHT BEARING RESTRICTIONS: No FALLS: Has patient fallen in last 6 months? Yes. Number of falls 6+ before going to hospital and 1 fall since being home from CIR - pt states she was trying to gather her clothes by herself rather than waiting for her sister to get there to help her  LIVING ENVIRONMENT Lives with: lives alone and but pt's sister, Haley Jones Jones, comes over daily to assist with ADLs and stays to provide supervision all day - pt states at night she would be able to walk to bathroom using RW if needed (but she wears depends in event of incontinence) Lives in: House/apartment Stairs: she has steps, but she doesn't have to use them (house was wheelchair accessible for her daughter)  Has following equipment at home: Environmental consultant - 2 wheeled, shower chair, Grab bars, Ramped entry, and transport chair  PLOF: Independent, Independent with household mobility without device, Independent with homemaking with ambulation, Independent with gait, Independent with transfers, and she was the primary caregiver for her recently deceased daughter   CLOF: Sister provides supervision daily for pt to ambulate using RW safely and provides assist for ADLs (showering and dressing - progressing towards supervision with these activities); Sister states pt requires cues to turn and step back fully prior to sitting to ensure her safety  PATIENT GOALS: get back to being able to go  shopping with improved community level ambulation without the walker; return to driving   OBJECTIVE:  Note: Objective measures were completed at Evaluation unless otherwise noted.  DIAGNOSTIC FINDINGS:   EXAM: MRI HEAD WITHOUT CONTRAST   TECHNIQUE: Multiplanar, multiecho pulse sequences of the brain and surrounding structures were obtained without intravenous contrast.   COMPARISON:  Brain MRI 07/31/2022.  IMPRESSION: 1. Patchy acute cortical/subcortical infarcts within the left parietal and occipital lobes individually measuring up to 3 cm (MCA vascular territory). Subtle petechial hemorrhage associated with some of these infarcts. No frank hemorrhagic conversion. 2. Additional punctate left MCA territory cortical acute infarct within the left insula. 3. Background parenchymal atrophy and chronic small vessel ischemic disease with chronic lacunar infarcts, as described. 4. Paranasal sinus disease as outlined (including severe right maxillary sinusitis).   Electronically Signed   By: Rockey Childs D.O.   On: 12/20/2023 11:52  COGNITION: Overall cognitive status: Within functional limits for tasks assessed   SENSATION: Light touch: initially misses the first 1-2 touches on R LE, but then able to feel remaining touches Proprioception: Impaired grossly  COORDINATION:  Symmetrical heel-to-shin bilaterally  EDEMA:  Not formally assessed, but none observed  MUSCLE TONE: Not formally assessed  POSTURE: rounded shoulders, forward head, and posterior pelvic tilt  LOWER EXTREMITY MMT:    MMT Right Eval Left Eval  Hip flexion 3+, no pain 3+ with some L hip and back pain with this  Hip extension    Hip abduction    Hip adduction    Hip internal rotation    Hip external rotation    Knee flexion 3+ 4  Knee extension 4- 4  Ankle dorsiflexion 3+ 4  Ankle plantarflexion 4- 4  Ankle inversion    Ankle eversion    (Blank rows = not tested)  BED MOBILITY:  Findings: Sit  to supine need to assess Supine to sit need to assess *pt reports some difficulty with bed mobility  TRANSFERS: Sit to stand: SBA  Assistive device utilized: Environmental consultant - 2 wheeled     Stand to sit: SBA  Assistive device utilized: Environmental consultant - 2 wheeled     Chair to chair: SBA  Assistive device utilized: Environmental consultant - 2 wheeled       GAIT: Findings: Gait Characteristics: slight R ankle instability noted, step through pattern, decreased stance time- Right, decreased stride length, and decreased ankle dorsiflexion- Right, Distance walked: ~110ft, Assistive device utilized:Walker - 2 wheeled, Level of assistance: SBA and CGA, and Comments:    FUNCTIONAL TESTS:  5 times sit to stand: 38.75 seconds, using UE support 10 meter walk test: 0.48 m/s using youth RW, requires CGA for safety 6 minute walk test: Visit 2, see note Berg Balance Scale: Visit 2, see note Functional gait assessment: need to assess, when appropriate  PATIENT SURVEYS:  ABC scale 7.5%                                                                                                                             TREATMENT DATE: 05/07/2024  TA- To improve functional movements patterns for everyday tasks    Gait with 4WW from clinic x 23 min with 3# AW on outdoor hills. Tasked pt with finding way back to clinic after walking new route outside she was unable to successfully complete this confidently today. Pt also tasked with 2 bouts of about 40 ft each of gait without AW and rollator outside on curving sidewalks- no LOB    NMR: To facilitate reeducation of movement, balance, posture, coordination, and/or proprioception/kinesthetic sense.  Activity Description: blaze pods ant and lat; ant pods on step. Tapping pods with UE assist working on attention to objects in lower visual field.  Activity Setting:  random Number of Pods:  4 Cycles/Sets:  2 Duration (Time or Hit Count):  1 min  Walking over red mats x 6 times across   Obstacle  course with red mat stepping over 1/2 foam rollers and walkinr between hedgehogs - trying to not kick or step on them. X 2 laps   -*1 lap =  1 time each way to return to start point     PATIENT EDUCATION: Education details: PT POC, findings on assessment today, recommendation to continue using RW for all functional mobility Person educated: Patient and Caregiver Haley Jones Jones Education method: Explanation Education comprehension: verbalized understanding and needs further education  HOME EXERCISE PROGRAM: Access Code: [email or web address] URL: [email or web address] Date: 02/04/2024 Prepared by: Peggye Linear  Exercises - Standing March with Counter Support  - 2 x daily - 7 x weekly - 2 sets - 20 reps - Side stepping with counter support: yellow band loop  - 2 x daily - 7 x weekly - 2 sets - 20 reps - Sit to Stand with Counter Support  - 2 x daily - 7 x weekly - 2 sets - 12 reps   GOALS: Goals reviewed with patient? Yes  SHORT TERM GOALS: Target date: 03/10/2024  Pt will be independent with HEP in order to improve strength and balance in order to decrease fall risk and improve function at home and work.  Baseline: need to initiate7/16: 2-3x week Goal status:  MET  LONG TERM GOALS: Target date: 06/19/2024  1.  Patient will complete five times sit to stand test in < 15 seconds indicating an increased LE strength and improved balance. Baseline:  38.75 seconds, using UE support, 7/16: 20.35 sec, UE support on 1st STS only 8/20:16.86 sec no UE support 9/10:12.16 sec  Goal Status: MET   2.  Patient will increase ABC scale score >50% to demonstrate better functional mobility and better confidence with ADLs.   Baseline: 7.5%, 7/16: 18.125% 8/20: 18.75% 9/10: 16.25% Goal status: ONGOING/ REGRESSED/ ALTERED   3.  Patient will increase Berg Balance score by > 6 points to demonstrate decreased fall risk during functional activities. Baseline: need to assess; 01/30/2024= 34/56, 7/16= 39  8/20:42 Goal status: MET   4. Patient will increase 10 meter walk test to >1.48m/s as to improve gait speed for better community ambulation and to reduce fall risk. Baseline: 0.48 m/s using youth RW, requires CGA for safety, 7/16: 0.69 m/s 8/20:  .76 m/s 9/10:.70 m/s Goal status: ONGOING  5. Patient will increase six minute walk test distance to >1000 for progression to community ambulator and improve gait ability Baseline: need to assess; 01/30/2024=410 feet using RW; 02/13/24: 426ft in 69m30s, 7/16: 687ft using 2WW 8/20: 900 ft with RW  Goal status: ONGOING  6.  Patient will improve dynamic gait index score by 4 points or greater to indicate improvement in gait with a rolling walker as well as decreased risk for falls Baseline:9/17:10 Goal status: INITIAL  7.  Pt will report 70% or greater confidence when ambulating around her home and completing her usual daily activities with LRAD.  Baseline: 30-50%  Goal status: INITIAL     ASSESSMENT:  CLINICAL IMPRESSION:    Patient presents with good motivation for completion of physical therapy activities.  Patient continuing to show good progress with outdoor ambulation utilizing rollator this date compared to standard walker. Pt also progressed with functional ambulation this date over various obstacles without AD working on balance anticipatory and other reactions to her environment. Pt also progressing with her ability to attend to objects on floor with interventions this date. Pt will continue to benefit from skilled physical therapy intervention to address impairments, improve QOL, and attain therapy goals.    OBJECTIVE IMPAIRMENTS: Abnormal gait, decreased activity tolerance, decreased balance, decreased endurance, decreased knowledge of use of DME, decreased mobility, difficulty walking, decreased strength, decreased safety  awareness, impaired vision/preception, and pain.   ACTIVITY LIMITATIONS: carrying, lifting, bending, standing,  squatting, stairs, transfers, bed mobility, bathing, toileting, dressing, reach over head, hygiene/grooming, and locomotion level  PARTICIPATION LIMITATIONS: meal prep, cleaning, laundry, driving, shopping, and community activity  PERSONAL FACTORS: Age, Time since onset of injury/illness/exacerbation, and 3+ comorbidities: Hypertension, B12 deficiency, type 2 diabetes, hyperlipidemia, CKD stage IIIa are also affecting patient's functional outcome.   REHAB POTENTIAL: Good  CLINICAL DECISION MAKING: Evolving/moderate complexity  EVALUATION COMPLEXITY: Moderate  PLAN:  PT FREQUENCY: 1-2x/week  PT DURATION: 8 weeks  PLANNED INTERVENTIONS: 97164- PT Re-evaluation, 97750- Physical Performance Testing, 97110-Therapeutic exercises, 97530- Therapeutic activity, [id number]- Neuromuscular re-education, 97535- Self Care, [id number]- Manual therapy, [id number]- Gait training, [phone]- Orthotic Initial, [phone]- Orthotic/Prosthetic subsequent, [phone]- Canalith repositioning, [phone]- Electrical stimulation (manual), Patient/Family education, Balance training, Stair training, Taping, Joint mobilization, Spinal mobilization, Vestibular training, Visual/preceptual remediation/compensation, DME instructions, Cryotherapy, Moist heat, and Biofeedback  PLAN FOR NEXT SESSION:    - floor transfers training - continue HIIT - continue gait training without AD for balance and righting (include multi plane, multidirectional)  - reaching to floor to pick things up without UE assist for improvement in functional activities.    Note: Portions of this document were prepared using Dragon voice recognition software and although reviewed may contain unintentional dictation errors in syntax, grammar, or spelling.  Lonni KATHEE Gainer PT ,DPT Physical Therapist- [facility]    2:03 PM, 05/07/24

## 2024-05-07 NOTE — Therapy (Addendum)
 Occupational Therapy Neuro Treatment Note   Patient Name: Haley Jones MRN: [id number] DOB:Apr 18, 1956, 68 y.o., female Today's Date: 05/07/2024  PCP: Cleotilde Oneil FALCON, MD REFERRING PROVIDER: Pegge Toribio PARAS, PA-C   OT End of Session - 05/07/24 2309     Visit Number 25    Date for Recertification  06/25/24    OT Start Time 1445    OT Stop Time 1530    OT Time Calculation (min) 45 min    Activity Tolerance Patient tolerated treatment well    Behavior During Therapy [facility] for tasks assessed/performed                  Past Medical History:  Diagnosis Date   Adnexal mass    Anxiety    Chronic back pain    Depression    Diabetes mellitus without complication (HCC)    Endometriosis 03/12/2018   GERD (gastroesophageal reflux disease)    Heart murmur 02/2018   undetected until she was an adult. 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   Weight loss 12/06/2017   Past Surgical History:  Procedure Laterality Date   ABDOMINAL HYSTERECTOMY  2008   ovaries also   CESAREAN SECTION  1994   COLONOSCOPY     COLONOSCOPY WITH PROPOFOL  N/A 02/12/2018   Procedure: COLONOSCOPY WITH PROPOFOL ;  Surgeon: Toledo, Ladell POUR, MD;  Location: [facility] ENDOSCOPY;  Service: Gastroenterology;  Laterality: N/A;   ESOPHAGOGASTRODUODENOSCOPY (EGD) WITH PROPOFOL  N/A 02/12/2018   Procedure: ESOPHAGOGASTRODUODENOSCOPY (EGD) WITH PROPOFOL ;  Surgeon: Toledo, Ladell POUR, MD;  Location: [facility] ENDOSCOPY;  Service: Gastroenterology;  Laterality: N/A;   EYE SURGERY Left 1973   muscle shortening to straighten cross eyes   LAPAROSCOPY N/A 02/20/2018   Procedure: LAPAROSCOPY DIAGNOSTIC, ( RESECTION OF RIGHT LOWER QUADRANT ABDOMINAL MASS);  Surgeon: Nicholaus Selinda Birmingham, MD;  Location: [facility] ORS;  Service: General;  Laterality: N/A;   ROTATOR CUFF REPAIR Left 2011   Patient Active Problem List   Diagnosis Date Noted    Depression with anxiety 01/04/2024   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   Acute CVA (cerebrovascular accident) (HCC) 12/20/2023   Primary hypertension 12/20/2023   Acute kidney injury superimposed on chronic kidney disease (HCC) 12/20/2023   DM type 2 with diabetic mixed hyperlipidemia (HCC) 11/17/2022   Major depressive disorder, recurrent, 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  Rationale for Evaluation and Treatment: Rehabilitation  SUBJECTIVE:   SUBJECTIVE STATEMENT:  Pt. Reports doing well today. Pt accompanied by: self  PERTINENT HISTORY: Pt. Is a 68 yo female with hx of a L MCA CVA. Pt. Was admitted into ED on 12/20/2023 with an onset of RUE weakness, numbness dysmetria, and visual deficits. Upon assessment, it was concluded that Pt. Experienced multiple infarcts in the L Parietal/Occipital region. PMHx: Dizziness, Falls  PRECAUTIONS: None  WEIGHT BEARING RESTRICTIONS: No  PAIN:  Are you having pain?  no  05/07/24: No pain 04/23/24: No pain 04/16/24: No pain 04/02/24: No pain 03/12/2024: 6/10 pain at the  R dorsal forearm during gross grip strengthening task. 03/10/2024: No pain today. 03/05/2024: No pain, 2-3/10 level of discomfort during use of green resisitve clips.    FALLS: Has patient fallen in last 6 months? Yes. Number of falls 10  LIVING ENVIRONMENT: Lives with: lives with their family and lives alone Lives in: House/apartment Stairs: No, Ramp Has following equipment at home: Walker - 2 wheeled, Shower bench, and bed side commode  PLOF:  Independent  PATIENT GOALS: To be able to feed herself and hold a fork and knife better.   OBJECTIVE:  Note: Objective measures were completed at Evaluation unless otherwise noted.  HAND DOMINANCE: Left  ADLs: Overall ADLs:  Transfers/ambulation related to ADLs:  Eating: Independent  Grooming: independent UB Dressing: Pt. Has difficulty managing buttons LB Dressing: jean zippers are difficult to manipulate, hiking pants, and clothing negotiation are difficult. Toileting: Independent Bathing: Independent Tub Shower transfers: stepping into shower is difficult.  Equipment: Grab bars, shower bench, bedside commode  IADLs: Shopping: Does not currently do that/has not tried Light housekeeping: Does not currently do that/has not tried Meal Prep: Has not cooked in months, did not prior to CVA, sister provides meals. Community mobility: Relies on family, and friends Medication management: Assistance from sister to initiate medication management, she gives herself a shot for diabetes Financial management: Sister is assisting with monthly bill management Handwriting: Cursive form is 10% legible; Printed form is 90% legible  MOBILITY STATUS: Needs Assist: CGA and Hx of falls  POSTURE COMMENTS:  No Significant postural limitations Sitting balance: Good  ACTIVITY TOLERANCE: Activity tolerance: Good  FUNCTIONAL OUTCOME MEASURES:   UPPER EXTREMITY ROM:    Active ROM Right eval Right 03/03/2024 Right  04/02/24 Left Eval [facility]  Shoulder flexion 50(58) 61(74) 62(76)   Shoulder abduction 43(48) 45(58) 52(58)   Shoulder adduction      Shoulder extension      Shoulder internal rotation      Shoulder external rotation      Elbow flexion 118(131) 125(128) 142(144)   Elbow extension -24(-22) -5(0) 0(0)   Wrist flexion 20 (21) 42(48) 64(72)   Wrist extension 30(31) 30(34) 52(58)   Wrist ulnar deviation      Wrist radial deviation      Wrist pronation      Wrist supination       (Blank rows = not tested)  UPPER EXTREMITY MMT:     MMT Right eval Right 03/03/2024 Right 04/02/24 Left eval  Shoulder flexion 2/5 2/5 2/5 WFL  Shoulder abduction 2/5 2/5 2/5 [facility]  Shoulder adduction      Shoulder extension      Shoulder internal rotation      Shoulder external rotation      Middle trapezius      Lower trapezius      Elbow flexion 4-/5 4/5 4/5 WFL  Elbow extension 4-/5 4/5 4/5 WFL  Wrist flexion 2+/5 4-/5 4-/5 WFL  Wrist extension 3-/5 4-/5 4-/5 WFL  Wrist ulnar deviation      Wrist radial deviation      Wrist pronation      Wrist supination      (Blank rows = not tested)  HAND FUNCTION: Grip strength: Right: 21 lbs; Left: 14 lbs, Lateral pinch: Right: 7 lbs, Left: 4 lbs, and 3 point pinch: Right: 4 lbs, Left: 5 lbs  03/03/2024:  Grip strength: Right: 23#, Left: 28#, Lateral pinch: Right: 6#, Left: 5#, 3 point pinch: Right: 6#, Left: 3#  04/02/24:  Grip strength: Right: 31#, Left: 35#, Lateral pinch: Right: 8#, Left: 7#, 3 point pinch: Right: 8#, Left: 6#   COORDINATION: 9 Hole Peg test: Right: 72 sec; Left: 40 sec  03/03/2024: 9 hole peg test: Right: 1 min 3 sec, Left: 36 sec  04/02/24  9 hole peg test: Right: 53 sec, Left: 30 sec    SENSATION: Light touch: Impaired  Proprioception: Impaired   EDEMA: none  MUSCLE TONE: RUE: Mild  COGNITION: Overall cognitive status: Impaired  VISION: Subjective report: Pt. Has not noticed the changes in vision but her sister has. Caregiver reports that Pt. Does not bring much attention to the R side.  Baseline vision:  Visual history:   VISION ASSESSMENT: TBD   PERCEPTION: TBD  PRAXIS: Impaired: Motor planning                                                                                                                           TREATMENT DATE: 05/07/2024   There. Ex.:  Pt. Tolerated AROM followed by PROM/AAROM to the end ROM for right shoulder flexion, and abduction, horizontal shoulder  abduction, and external rotation.  Therapeutic Act.:   -Pt. worked on the left hand [facility] skills grasping 1/2 cubes with the right hand creating 4 letter words from cube letters placed randomly on the table  to encourage visual scanning. -Pt. worked on reaching up to discard the 1/2 cubes into a container placed at multiple elevated planes.  Neuromuscular re-education:   -Facilitated right hand fine motor coordination skills grasping 1 inch sticks from a horizontal position in a shallow dish and placing them vertically onto the [place] pegboard. - Facilitated bilateral alternating hand coordination skills to remove the 1 inch vertical sticks in midline to replace them into the dishes on the far left and right of the board.       PATIENT EDUCATION: Education details: visual perceptual skills, visual compensatory strategies , RUE weakness, BUE functioning,  Right shoulder ROM Person educated: Patient and Sister Education method: Explanation, Demonstration, Tactile cues, and Verbal cues Education comprehension: verbalized understanding and returned demonstration  HOME EXERCISE PROGRAM: Yellow theraputty HEP   GOALS: Goals reviewed with patient? Yes  SHORT TERM GOALS: Target date: 03/10/2024    Pt. Will be independent utilizing HEPs for hand strengthening exercises.  Baseline: Eval: No current HEP program Goal status: INITIAL   LONG TERM GOALS: Target date: 04/21/2024   Pt. Will improve BUE grip strength by 5# of force to be able to open jars. Baseline: 04/02/24: Grip strength: Right: 31#, Left: 35# 03/03/2024: R grip strength: 23#, L grip strength: 28#. Pt. has difficulty securely holding items in her hand. Eval: R grip strength: 21#, L grip strength: 14# Goal status: 04/02/24 achieved; revised to open jars   2.  Pt. Will improve RUE [facility] skills by 3 sec. to be able to manipulate  small objects. Baseline: 04/02/24: 9 hole peg test: Right: 53 sec, Left: 30 sec 03/03/2024: 9 hole  peg  test: R side: 1 min 3 sec, L side: 36 sec. 9 hole peg test; R side: 72 secs, L side: 40 secs Goal status: progressing, revised 8/20 for manipulating small objects.  3.  Pt. Will increase R shoulder flex/shoulder ABD ROM by 10 degrees to perform ADLs/IADLs.  Baseline: 04/02/24: Right shoulder flexion: 62(76), Abduction: 52(58) 03/03/2024: R shoulder flex: 61(74), R shoulder ABD: 45(58) R shoulder flex: 50(58), R shoulder ABD: 43(48) Goal status: progressing, ongoing  4.  Pt. Will improve handwriting to 50% legible in cursive form, legibility to be able to send written correspondence Baseline: 03/03/2024: 50% legible in cursive form, Eval: 10% legible in cursive form, 90% legible in printed form. Goal status: Discontinue goal per Pt. report that it has returned to baseline.  5.  Pt. Will improve BUE pinch strength by 2# of force to  be able to open medication bottles Baseline: 04/02/24: Lateral pinch: Right: 8#, Left: 7#, 3 point pinch: Right: 8#, Left: 6# 03/03/2024: Lateral Pinch: R: 6#, L: 5#, 3 pt pinch: R: 6#, L: 3#.  Eval: Lateral Pinch: 7, R 3 pt. Pinch: 4, L Lateral Pinch: 4, L 3 pt. Pinch: 5 Goal status:  04/02/24: Achieved; revised to be able to open medication bottles  6.  Pt. Will increase R wrist extension/flexion by 5 degrees in anticipation of reaching hold of items for ADLs. Baseline: 04/02/24: 52(58) 03/03/2024: R wrist ext: 30 (34), R Wrist flex: 42(48) Eval: R Wrist ext: 20(21), R Wrist flex: 30(31) Goal status: Achieved  ASSESSMENT:  CLINICAL IMPRESSION:  Patient tolerated the session well with no reports of pain. Patient presents with tightness in the right shoulder and requires cues when reaching through multiple planes to avoid compensation proximally. Patient requires cues for visual demonstration and tactile cues for fine motor coordination and consistent cueing for bilateral hand coordination tasks.  Patient requires education about working on the right upper extremity in  therapy (which was affected by the stroke) instead of the left upper extremity. Patient reports that her left hand hand is her dominant hand.  Patient was encouraged to continue using her left upper extremity and hand during tasks at home while trying to increase engagement of the right hand during more tasks. Pt. continues to benefit from OT services to work on RUE ROM, RUE strengthening, bilateral grip strength, bilateral pinch strength, bilateral [facility] skills, and Right sided awareness and attention in order to improve functional independence of ADL/IADL tasks at home.   PERFORMANCE DEFICITS: in functional skills including ADLs, IADLs, coordination, dexterity, proprioception, sensation, tone, ROM, strength, pain, Fine motor control, Gross motor control, endurance, and UE functional use, and psychosocial skills including environmental adaptation, habits, interpersonal interactions, and routines and behaviors.   IMPAIRMENTS: are limiting patient from ADLs, IADLs, rest and sleep, work, leisure, and social participation.   CO-MORBIDITIES: may have co-morbidities  that affects occupational performance. Patient will benefit from skilled OT to address above impairments and improve overall function.  MODIFICATION OR ASSISTANCE TO COMPLETE EVALUATION: Min-Moderate modification of tasks or assist with assess necessary to complete an evaluation.  OT OCCUPATIONAL PROFILE AND HISTORY: Detailed assessment: Review of records and additional review of physical, cognitive, psychosocial history related to current functional performance.  CLINICAL DECISION MAKING: Moderate - several treatment options, min-mod task modification necessary  REHAB POTENTIAL: Good  EVALUATION COMPLEXITY: Moderate    PLAN:  OT FREQUENCY: 1x/week  OT DURATION: 12 weeks  PLANNED INTERVENTIONS: 97535 self care/ADL training, 02889 therapeutic exercise, 97530 therapeutic activity, 97112  neuromuscular re-education, 97018 paraffin, 02989  moist heat, 97010 cryotherapy, 97034 contrast bath, 97032 electrical stimulation (manual), passive range of motion, visual/perceptual remediation/compensation, energy conservation, patient/family education, and DME and/or AE instructions  RECOMMENDED OTHER SERVICES: ST & PT  CONSULTED AND AGREED WITH PLAN OF CARE: Patient and family member/caregiver  PLAN FOR NEXT SESSION: treatment   Richardson Otter, MS, OTR/L   05/07/2024, 11:12 PM

## 2024-05-12 ENCOUNTER — Ambulatory Visit: Admitting: Physical Therapy

## 2024-05-12 ENCOUNTER — Ambulatory Visit: Admitting: Occupational Therapy

## 2024-05-12 ENCOUNTER — Ambulatory Visit: Admitting: Speech Pathology

## 2024-05-12 DIAGNOSIS — R2681 Unsteadiness on feet: Secondary | ICD-10-CM

## 2024-05-12 DIAGNOSIS — R269 Unspecified abnormalities of gait and mobility: Secondary | ICD-10-CM

## 2024-05-12 DIAGNOSIS — M6281 Muscle weakness (generalized): Secondary | ICD-10-CM | POA: Diagnosis not present

## 2024-05-12 DIAGNOSIS — H543 Unqualified visual loss, both eyes: Secondary | ICD-10-CM

## 2024-05-12 NOTE — Therapy (Signed)
 OUTPATIENT PHYSICAL THERAPY TREATMENT/ Physical Therapy Progress Note   Dates of reporting period  03/31/24   to   05/12/24    Patient Name: Haley Jones MRN: [id number] DOB:1955-10-17, 68 y.o., female Today's Date: 05/12/2024  PCP: Dr. Oneil Pinal REFERRING PROVIDER: Toribio Pitch, PA-C  END OF SESSION:   PT End of Session - 05/12/24 1403     Visit Number 30    Number of Visits 40    Date for Recertification  06/18/24    Authorization Type [employer]    Progress Note Due on Visit 30    PT Start Time 1402    PT Stop Time 1242    PT Time Calculation (min) 1360 min    Equipment Utilized During Treatment Gait belt    Activity Tolerance Patient tolerated treatment well    Behavior During Therapy WFL for tasks assessed/performed                     Past Medical History:  Diagnosis Date   Adnexal mass    Anxiety    Chronic back pain    Depression    Diabetes mellitus without complication (HCC)    Endometriosis 03/12/2018   GERD (gastroesophageal reflux disease)    Heart murmur 02/2018   undetected until she was an adult. 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   Weight loss 12/06/2017   Past Surgical History:  Procedure Laterality Date   ABDOMINAL HYSTERECTOMY  2008   ovaries also   CESAREAN SECTION  1994   COLONOSCOPY     COLONOSCOPY WITH PROPOFOL  N/A 02/12/2018   Procedure: COLONOSCOPY WITH PROPOFOL ;  Surgeon: Toledo, Ladell POUR, MD;  Location: [facility];  Service: Gastroenterology;  Laterality: N/A;   ESOPHAGOGASTRODUODENOSCOPY (EGD) WITH PROPOFOL  N/A 02/12/2018   Procedure: ESOPHAGOGASTRODUODENOSCOPY (EGD) WITH PROPOFOL ;  Surgeon: Toledo, Ladell POUR, MD;  Location: [facility];  Service: Gastroenterology;  Laterality: N/A;   EYE SURGERY Left 1973   muscle shortening to straighten cross eyes   LAPAROSCOPY N/A 02/20/2018   Procedure: LAPAROSCOPY  DIAGNOSTIC, ( RESECTION OF RIGHT LOWER QUADRANT ABDOMINAL MASS);  Surgeon: Nicholaus Selinda Birmingham, MD;  Location: [facility] OR[facility];  Service: General;  Laterality: N/A;   ROTATOR CUFF REPAIR Left 2011   Patient Active Problem List   Diagnosis Date Noted   Cognitive communication deficit 04/07/2024   Aphasia 04/07/2024   Urinary incontinence as sequela of cerebrovascular accident (CVA) 02/11/2024   Depression with anxiety 01/04/2024   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   Acute CVA (cerebrovascular accident) (HCC) 12/20/2023   Primary hypertension 12/20/2023   Acute kidney injury superimposed on chronic kidney disease 12/20/2023   DM type 2 with diabetic mixed hyperlipidemia (HCC) 11/17/2022   Major depressive disorder, recurrent, 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   B12 deficiency 02/23/2021   SBO (small bowel obstruction) (HCC) 01/29/2019   Barrett's esophagus without dysplasia 04/15/2018   Weight loss 12/06/2017   Hoarse 12/06/2017   Lumbar disc disease 07/26/2017   Diabetes mellitus type 2, controlled, without complications (HCC) 02/09/2015   Surgical menopause 11/26/2014    ONSET DATE: 12/20/2023  REFERRING DIAG: P36.487 (ICD-10-CM) - Left middle cerebral artery stroke (HCC)   THERAPY DIAG:  Unsteadiness on feet  Abnormality of gait  Low vision, both eyes  Muscle weakness (generalized)  Rationale for Evaluation and Treatment: Rehabilitation  SUBJECTIVE:  SUBJECTIVE STATEMENT:   Denies any falls since last session. Feels like she has been doing well. Walks from upstairs to clinic with [phone]. Took a few steps at home without RW and had good balance over the weekend.   PERTINENT HISTORY:Pt states she is not sure the exact date of when her CVA happened because she kept falling at home trying to care for herself  and her daughter, but she finally went to the hospital when her sister insisted on it. Pt states her special needs daughter, [name], passed away recently while pt was in the hospital at [facility] before she was transferred to [facility] for stroke rehabilitation.Pt's sister states pt requires supervision/cuing for ADLs  to don clothes correctly, with correct orientation (potential apraxia?) Pt/sister report pt has R side inattention.Pt reports supposedly one of her legs is shorter than the other, believes her R LE might be the shorter one. 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     PAIN:  Are you having pain? No  PRECAUTIONS: Fall RED FLAGS: None  WEIGHT BEARING RESTRICTIONS: No FALLS: Has patient fallen in last 6 months? Yes. Number of falls 6+ before going to hospital and 1 fall since being home from CIR - pt states she was trying to gather her clothes by herself rather than waiting for her sister to get there to help her  LIVING ENVIRONMENT Lives with: lives alone and but pt's sister, [name], comes over daily to assist with ADLs and stays to provide supervision all day - pt states at night she would be able to walk to bathroom using RW if needed (but she wears depends in event of incontinence) Lives in: House/apartment Stairs: she has steps, but she doesn't have to use them (house was wheelchair accessible for her daughter)  Has following equipment at home: Environmental consultant - 2 wheeled, shower chair, Grab bars, Ramped entry, and transport chair  PLOF: Independent, Independent with household mobility without device, Independent with homemaking with ambulation, Independent with gait, Independent with transfers, and she was the primary caregiver for her recently deceased daughter   CLOF: Sister provides supervision daily for pt to ambulate using RW safely and provides assist for ADLs (showering and dressing - progressing towards supervision with  these activities); Sister states pt requires cues to turn and step back fully prior to sitting to ensure her safety  PATIENT GOALS: get back to being able to go shopping with improved community level ambulation without the walker; return to driving   OBJECTIVE:  Note: Objective measures were completed at Evaluation unless otherwise noted.  DIAGNOSTIC FINDINGS:   EXAM: MRI HEAD WITHOUT CONTRAST   TECHNIQUE: Multiplanar, multiecho pulse sequences of the brain and surrounding structures were obtained without intravenous contrast.   COMPARISON:  Brain MRI 07/31/2022.  IMPRESSION: 1. Patchy acute cortical/subcortical infarcts within the left parietal and occipital lobes individually measuring up to 3 cm (MCA vascular territory). Subtle petechial hemorrhage associated with some of these infarcts. No frank hemorrhagic conversion. 2. Additional punctate left MCA territory cortical acute infarct within the left insula. 3. Background parenchymal atrophy and chronic small vessel ischemic disease with chronic lacunar infarcts, as described. 4. Paranasal sinus disease as outlined (including severe right maxillary sinusitis).   Electronically Signed   By: Rockey Childs D.O.   On: 12/20/2023 11:52  COGNITION: Overall cognitive status: Within functional limits for tasks assessed   SENSATION: Light touch: initially misses the first 1-2 touches  on R LE, but then able to feel remaining touches Proprioception: Impaired grossly  COORDINATION: Symmetrical heel-to-shin bilaterally  EDEMA:  Not formally assessed, but none observed  MUSCLE TONE: Not formally assessed  POSTURE: rounded shoulders, forward head, and posterior pelvic tilt  LOWER EXTREMITY MMT:    MMT Right Eval Left Eval  Hip flexion 3+, no pain 3+ with some L hip and back pain with this  Hip extension    Hip abduction    Hip adduction    Hip internal rotation    Hip external rotation    Knee flexion 3+ 4  Knee  extension 4- 4  Ankle dorsiflexion 3+ 4  Ankle plantarflexion 4- 4  Ankle inversion    Ankle eversion    (Blank rows = not tested)  BED MOBILITY:  Findings: Sit to supine need to assess Supine to sit need to assess *pt reports some difficulty with bed mobility  TRANSFERS: Sit to stand: SBA  Assistive device utilized: Environmental consultant - 2 wheeled     Stand to sit: SBA  Assistive device utilized: Environmental consultant - 2 wheeled     Chair to chair: SBA  Assistive device utilized: Environmental consultant - 2 wheeled       GAIT: Findings: Gait Characteristics: slight R ankle instability noted, step through pattern, decreased stance time- Right, decreased stride length, and decreased ankle dorsiflexion- Right, Distance walked: ~1105ft, Assistive device utilized:Walker - 2 wheeled, Level of assistance: SBA and CGA, and Comments:    FUNCTIONAL TESTS:  5 times sit to stand: 38.75 seconds, using UE support 10 meter walk test: 0.48 m/s using youth RW, requires CGA for safety 6 minute walk test: Visit 2, see note Berg Balance Scale: Visit 2, see note Functional gait assessment: need to assess, when appropriate  PATIENT SURVEYS:  ABC scale 7.5%                                                                                                                             TREATMENT DATE: 05/12/2024  Physical Performance Test or Measurement: a  physical performance test(s) or measurement (eg,  musculoskeletal, functional capacity), with written report,  each 15 mins   6 Min Walk Test:  Instructed patient to ambulate as quickly and as safely as possible for 6 minutes using LRAD. Patient was allowed to take standing rest breaks without stopping the test, but if the patient required a sitting rest break the clock would be stopped and the test would be over.  Results: 835 feet using a RW. Results indicate that the patient has reduced endurance with ambulation compared to age matched norms.  Age Matched Norms (in meters): 71-69 yo M: 51 F:  86, 26-79 yo M: 61 F: 471, 11-89 yo M: 417 F: 392 MDC: 58.21 meters (190.98 feet) or 50 meters (ANPTA Core Set of Outcome Measures for Adults with Neurologic Conditions, 2018)   TA- To improve functional movements patterns for everyday tasks   Gait no  AD with CGA x 950 ft to medical artc center navigating people and doorways. No overt LOB.    NMR: To facilitate reeducation of movement, balance, posture, coordination, and/or proprioception/kinesthetic sense.  Activity Description: blaze pods ant and lat; ant pods on step. Tapping pods with without UE assist  Activity Setting:  random Number of Pods:  4 Cycles/Sets:  4 Duration (Time or Hit Count):  1 min - difficulty R LE tapping-   Walking over red mats x 6 times across   Obstacle course with red mat stepping over 1/2 foam rollers and  walking on and off of step trainer - X4  laps - cues for proper sequencing with looking down at step.   -*1 lap = 1 time each way to return to start point     PATIENT EDUCATION: Education details: PT POC, findings on assessment today, recommendation to continue using RW for all functional mobility Person educated: Patient and Caregiver [name] Education method: Explanation Education comprehension: verbalized understanding and needs further education  HOME EXERCISE PROGRAM: Access Code: [email or web address] URL: [email or web address] Date: 02/04/2024 Prepared by: Peggye Linear  Exercises - Standing March with Counter Support  - 2 x daily - 7 x weekly - 2 sets - 20 reps - Side stepping with counter support: yellow band loop  - 2 x daily - 7 x weekly - 2 sets - 20 reps - Sit to Stand with Counter Support  - 2 x daily - 7 x weekly - 2 sets - 12 reps   GOALS: Goals reviewed with patient? Yes  SHORT TERM GOALS: Target date: 03/10/2024  Pt will be independent with HEP in order to improve strength and balance in order to decrease fall risk and improve function at home and work.  Baseline: need  to initiate7/16: 2-3x week Goal status:  MET  LONG TERM GOALS: Target date: 06/19/2024  1.  Patient will complete five times sit to stand test in < 15 seconds indicating an increased LE strength and improved balance. Baseline:  38.75 seconds, using UE support, 7/16: 20.35 sec, UE support on 1st STS only 8/20:16.86 sec no UE support 9/10:12.16 sec  Goal Status: MET   2.  Patient will increase ABC scale score >50% to demonstrate better functional mobility and better confidence with ADLs.   Baseline: 7.5%, 7/16: 18.125% 8/20: 18.75% 9/10: 16.25% Goal status: ONGOING/ REGRESSED/ ALTERED   3.  Patient will increase Berg Balance score by > 6 points to demonstrate decreased fall risk during functional activities. Baseline: need to assess; 01/30/2024= 34/56, 7/16= 39 8/20:42 Goal status: MET   4. Patient will increase 10 meter walk test to >1.12m/s as to improve gait speed for better community ambulation and to reduce fall risk. Baseline: 0.48 m/s using youth RW, requires CGA for safety, 7/16: 0.69 m/s 8/20:  .76 m/s 9/10:.70 m/s Goal status: ONGOING  5. Patient will increase six minute walk test distance to >1000 for progression to community ambulator and improve gait ability Baseline: need to assess; 01/30/2024=410 feet using RW; 02/13/24: 463ft in 1m30s, 7/16: 676ft using 2WW 8/20: 900 ft with RW  9/29: 835 ft with 4WW  Goal status: ONGOING  6.  Patient will improve dynamic gait index score by 4 points or greater to indicate improvement in gait with a rolling walker as well as decreased risk for falls Baseline:9/17:10 Goal status: INITIAL  7.  Pt will report 70% or greater confidence when ambulating around her home and completing her usual daily activities with LRAD.  Baseline: 30-50%  Goal status: INITIAL     ASSESSMENT:  CLINICAL IMPRESSION:    Patient presents with good motivation for physical therapy interventions.  Patient's goals were reassessed recently to did not reassess all  goals for progress note but did assess 6-minute walk test.  Patient had slight regression in 6-minute walk test but is still ambulating at a higher quality when she is ambulating without an assistive device compared to previously.  Patient overall feels she is improving in her balance and attention to objects on the ground tends to be improving as well. Patient's condition has the potential to improve in response to therapy. Maximum improvement is yet to be obtained. The anticipated improvement is attainable and reasonable in a generally predictable time.  Pt will continue to benefit from skilled physical therapy intervention to address impairments, improve QOL, and attain therapy goals.      OBJECTIVE IMPAIRMENTS: Abnormal gait, decreased activity tolerance, decreased balance, decreased endurance, decreased knowledge of use of DME, decreased mobility, difficulty walking, decreased strength, decreased safety awareness, impaired vision/preception, and pain.   ACTIVITY LIMITATIONS: carrying, lifting, bending, standing, squatting, stairs, transfers, bed mobility, bathing, toileting, dressing, reach over head, hygiene/grooming, and locomotion level  PARTICIPATION LIMITATIONS: meal prep, cleaning, laundry, driving, shopping, and community activity  PERSONAL FACTORS: Age, Time since onset of injury/illness/exacerbation, and 3+ comorbidities: Hypertension, B12 deficiency, type 2 diabetes, hyperlipidemia, CKD stage IIIa are also affecting patient's functional outcome.   REHAB POTENTIAL: Good  CLINICAL DECISION MAKING: Evolving/moderate complexity  EVALUATION COMPLEXITY: Moderate  PLAN:  PT FREQUENCY: 1-2x/week  PT DURATION: 8 weeks  PLANNED INTERVENTIONS: [id number]- PT Re-evaluation, 97750- Physical Performance Testing, 97110-Therapeutic exercises, 97530- Therapeutic activity, [id number]- Neuromuscular re-education, 97535- Self Care, [id number]- Manual therapy, [id number]- Gait training, [phone]- Orthotic Initial,  [phone]- Orthotic/Prosthetic subsequent, [phone]- Canalith repositioning, [phone]- Electrical stimulation (manual), Patient/Family education, Balance training, Stair training, Taping, Joint mobilization, Spinal mobilization, Vestibular training, Visual/preceptual remediation/compensation, DME instructions, Cryotherapy, Moist heat, and Biofeedback  PLAN FOR NEXT SESSION:    - floor transfers training - continue HIIT - continue gait training without AD for balance and righting (include multi plane, multidirectional)  - reaching to floor to pick things up without UE assist for improvement in functional activities.    Note: Portions of this document were prepared using Dragon voice recognition software and although reviewed may contain unintentional dictation errors in syntax, grammar, or spelling.  Lonni KATHEE Gainer PT ,DPT Physical Therapist- [place]  [facility]    3:09 PM, 05/12/24

## 2024-05-14 ENCOUNTER — Ambulatory Visit: Admitting: Speech Pathology

## 2024-05-14 ENCOUNTER — Ambulatory Visit: Admitting: Occupational Therapy

## 2024-05-14 ENCOUNTER — Ambulatory Visit: Attending: Physician Assistant

## 2024-05-14 DIAGNOSIS — I69351 Hemiplegia and hemiparesis following cerebral infarction affecting right dominant side: Secondary | ICD-10-CM | POA: Diagnosis present

## 2024-05-14 DIAGNOSIS — R269 Unspecified abnormalities of gait and mobility: Secondary | ICD-10-CM | POA: Diagnosis present

## 2024-05-14 DIAGNOSIS — M6281 Muscle weakness (generalized): Secondary | ICD-10-CM

## 2024-05-14 DIAGNOSIS — H543 Unqualified visual loss, both eyes: Secondary | ICD-10-CM | POA: Insufficient documentation

## 2024-05-14 DIAGNOSIS — R278 Other lack of coordination: Secondary | ICD-10-CM | POA: Insufficient documentation

## 2024-05-14 DIAGNOSIS — R2681 Unsteadiness on feet: Secondary | ICD-10-CM | POA: Diagnosis present

## 2024-05-14 NOTE — Therapy (Addendum)
 Occupational Therapy Neuro Treatment Note   Patient Name: Haley Jones MRN: [id number] DOB:1956-02-16, 68 y.o., female Today's Date: 05/14/2024  PCP: Cleotilde Oneil FALCON, MD REFERRING PROVIDER: Pegge Toribio PARAS, PA-C   OT End of Session - 05/14/24 1640     Visit Number 26    Number of Visits 48    Date for Recertification  06/25/24    OT Start Time 1445    OT Stop Time 1530    OT Time Calculation (min) 45 min    Activity Tolerance Patient tolerated treatment well    Behavior During Therapy [facility] for tasks assessed/performed                  Past Medical History:  Diagnosis Date   Adnexal mass    Anxiety    Chronic back pain    Depression    Diabetes mellitus without complication (HCC)    Endometriosis 03/12/2018   GERD (gastroesophageal reflux disease)    Heart murmur 02/2018   undetected until she was an adult. 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   Weight loss 12/06/2017   Past Surgical History:  Procedure Laterality Date   ABDOMINAL HYSTERECTOMY  2008   ovaries also   CESAREAN SECTION  1994   COLONOSCOPY     COLONOSCOPY WITH PROPOFOL  N/A 02/12/2018   Procedure: COLONOSCOPY WITH PROPOFOL ;  Surgeon: Toledo, Ladell POUR, MD;  Location: [facility] ENDOSCOPY;  Service: Gastroenterology;  Laterality: N/A;   ESOPHAGOGASTRODUODENOSCOPY (EGD) WITH PROPOFOL  N/A 02/12/2018   Procedure: ESOPHAGOGASTRODUODENOSCOPY (EGD) WITH PROPOFOL ;  Surgeon: Toledo, Ladell POUR, MD;  Location: [facility] ENDOSCOPY;  Service: Gastroenterology;  Laterality: N/A;   EYE SURGERY Left 1973   muscle shortening to straighten cross eyes   LAPAROSCOPY N/A 02/20/2018   Procedure: LAPAROSCOPY DIAGNOSTIC, ( RESECTION OF RIGHT LOWER QUADRANT ABDOMINAL MASS);  Surgeon: Nicholaus Selinda Birmingham, MD;  Location: [facility] ORS;  Service: General;  Laterality: N/A;   ROTATOR CUFF REPAIR Left 2011   Patient Active Problem List    Diagnosis Date Noted   Depression with anxiety 01/04/2024   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   Acute CVA (cerebrovascular accident) (HCC) 12/20/2023   Primary hypertension 12/20/2023   Acute kidney injury superimposed on chronic kidney disease (HCC) 12/20/2023   DM type 2 with diabetic mixed hyperlipidemia (HCC) 11/17/2022   Major depressive disorder, recurrent, 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  Rationale for Evaluation and Treatment: Rehabilitation  SUBJECTIVE:   SUBJECTIVE STATEMENT:  Pt. reports having had a good weekend. Pt accompanied by: self  PERTINENT HISTORY: Pt. Is a 68 yo female with hx of a L MCA CVA. Pt. Was admitted into ED on 12/20/2023 with an onset of RUE weakness, numbness dysmetria, and visual deficits. Upon assessment, it was concluded that Pt. Experienced multiple infarcts in the L Parietal/Occipital region. PMHx: Dizziness, Falls  PRECAUTIONS: None  WEIGHT BEARING RESTRICTIONS: No  PAIN:  Are you having pain?  no  05/14/24: No pain 05/07/24: No pain 04/23/24: No  pain 04/16/24: No pain 04/02/24: No pain 03/12/2024: 6/10 pain at the R dorsal forearm during gross grip strengthening task. 03/10/2024: No pain today. 03/05/2024: No pain, 2-3/10 level of discomfort during use of green resisitve clips.    FALLS: Has patient fallen in last 6 months? Yes. Number of falls 10  LIVING ENVIRONMENT: Lives with: lives with their family and lives alone Lives in: House/apartment Stairs: No, Ramp Has following equipment at home: Walker - 2 wheeled, Shower  bench, and bed side commode  PLOF: Independent  PATIENT GOALS: To be able to feed herself and hold a fork and knife better.   OBJECTIVE:  Note: Objective measures were completed at Evaluation unless otherwise noted.  HAND DOMINANCE: Left  ADLs: Overall ADLs:  Transfers/ambulation related to ADLs:  Eating: Independent  Grooming: independent UB Dressing: Pt. Has difficulty managing buttons LB Dressing: jean zippers are difficult to manipulate, hiking pants, and clothing negotiation are difficult. Toileting: Independent Bathing: Independent Tub Shower transfers: stepping into shower is difficult.  Equipment: Grab bars, shower bench, bedside commode  IADLs: Shopping: Does not currently do that/has not tried Light housekeeping: Does not currently do that/has not tried Meal Prep: Has not cooked in months, did not prior to CVA, sister provides meals. Community mobility: Relies on family, and friends Medication management: Assistance from sister to initiate medication management, she gives herself a shot for diabetes Financial management: Sister is assisting with monthly bill management Handwriting: Cursive form is 10% legible; Printed form is 90% legible  MOBILITY STATUS: Needs Assist: CGA and Hx of falls  POSTURE COMMENTS:  No Significant postural limitations Sitting balance: Good  ACTIVITY TOLERANCE: Activity tolerance: Good  FUNCTIONAL OUTCOME MEASURES:   UPPER EXTREMITY ROM:    Active ROM Right eval Right 03/03/2024 Right  04/02/24 Left Eval [facility]  Shoulder flexion 50(58) 61(74) 62(76)   Shoulder abduction 43(48) 45(58) 52(58)   Shoulder adduction      Shoulder extension      Shoulder internal rotation      Shoulder external rotation      Elbow flexion 118(131) 125(128) 142(144)   Elbow extension -24(-22) -5(0) 0(0)   Wrist flexion 20 (21) 42(48) 64(72)   Wrist extension 30(31) 30(34) 52(58)   Wrist ulnar deviation      Wrist radial deviation      Wrist  pronation      Wrist supination      (Blank rows = not tested)  UPPER EXTREMITY MMT:     MMT Right eval Right 03/03/2024 Right 04/02/24 Left eval  Shoulder flexion 2/5 2/5 2/5 WFL  Shoulder abduction 2/5 2/5 2/5 [facility]  Shoulder adduction      Shoulder extension      Shoulder internal rotation      Shoulder external rotation      Middle trapezius      Lower trapezius      Elbow flexion 4-/5 4/5 4/5 WFL  Elbow extension 4-/5 4/5 4/5 WFL  Wrist flexion 2+/5 4-/5 4-/5 WFL  Wrist extension 3-/5 4-/5 4-/5 WFL  Wrist ulnar deviation      Wrist radial deviation      Wrist pronation      Wrist supination      (Blank rows = not tested)  HAND FUNCTION: Grip strength: Right: 21 lbs; Left: 14 lbs, Lateral pinch: Right: 7 lbs, Left: 4 lbs, and 3 point pinch: Right: 4 lbs, Left: 5 lbs  03/03/2024:  Grip strength: Right: 23#, Left: 28#, Lateral pinch: Right: 6#,  Left: 5#, 3 point pinch: Right: 6#, Left: 3#  04/02/24:  Grip strength: Right: 31#, Left: 35#, Lateral pinch: Right: 8#, Left: 7#, 3 point pinch: Right: 8#, Left: 6#   COORDINATION: 9 Hole Peg test: Right: 72 sec; Left: 40 sec  03/03/2024: 9 hole peg test: Right: 1 min 3 sec, Left: 36 sec  04/02/24  9 hole peg test: Right: 53 sec, Left: 30 sec    SENSATION: Light touch: Impaired  Proprioception: Impaired   EDEMA: none  MUSCLE TONE: RUE: Mild  COGNITION: Overall cognitive status: Impaired  VISION: Subjective report: Pt. has not noticed the changes in vision but her sister has. Caregiver reports that Pt. Does not bring much attention to the R side.  Baseline vision:  Visual history:   VISION ASSESSMENT: TBD   PERCEPTION: TBD  PRAXIS: Impaired: Motor planning                                                                                                                           TREATMENT DATE: 05/14/2024   There. Ex.:  Pt. tolerated AROM followed by PROM/AAROM to the end ROM for right shoulder flexion,  and abduction, horizontal shoulder abduction, and external rotation following moist heat modality.  Therapeutic Act.:   -Facilitated right hand [facility] skills, and bilateral hand [facility]/dexterity skills to improve bilateral hand skills needed to fasten small bra clasps. Pt. worked on grasping extra small beaded objects with her right hand, and placing them onto a small resistive dowel.    PATIENT EDUCATION: Education details: visual perceptual skills, visual compensatory strategies , RUE weakness, BUE functioning,  Right shoulder ROM Person educated: Patient and Sister Education method: Explanation, Demonstration, Tactile cues, and Verbal cues Education comprehension: verbalized understanding and returned demonstration  HOME EXERCISE PROGRAM: Yellow theraputty HEP   GOALS: Goals reviewed with patient? Yes  SHORT TERM GOALS: Target date: 03/10/2024    Pt. Will be independent utilizing HEPs for hand strengthening exercises.  Baseline: Eval: No current HEP program Goal status: INITIAL   LONG TERM GOALS: Target date: 04/21/2024   Pt. Will improve BUE grip strength by 5# of force to be able to open jars. Baseline: 04/02/24: Grip strength: Right: 31#, Left: 35# 03/03/2024: R grip strength: 23#, L grip strength: 28#. Pt. has difficulty securely holding items in her hand. Eval: R grip strength: 21#, L grip strength: 14# Goal status: 04/02/24 achieved; revised to open jars   2.  Pt. Will improve RUE [facility] skills by 3 sec. to be able to manipulate  small objects. Baseline: 04/02/24: 9 hole peg test: Right: 53 sec, Left: 30 sec 03/03/2024: 9 hole peg test: R side: 1 min 3 sec, L side: 36 sec. 9 hole peg test; R side: 72 secs, L side: 40 secs Goal status: progressing, revised 8/20 for manipulating small objects.  3.  Pt. Will increase R shoulder flex/shoulder ABD ROM by 10 degrees to perform ADLs/IADLs.  Baseline: 04/02/24: Right shoulder flexion: 62(76), Abduction: 52(58)  03/03/2024: R shoulder flex:  61(74), R shoulder ABD: 45(58) R shoulder flex: 50(58), R shoulder ABD: 43(48) Goal status: progressing, ongoing  4.  Pt. Will improve handwriting to 50% legible in cursive form, legibility to be able to send written correspondence Baseline: 03/03/2024: 50% legible in cursive form, Eval: 10% legible in cursive form, 90% legible in printed form. Goal status: Discontinue goal per Pt. report that it has returned to baseline.  5.  Pt. Will improve BUE pinch strength by 2# of force to  be able to open medication bottles Baseline: 04/02/24: Lateral pinch: Right: 8#, Left: 7#, 3 point pinch: Right: 8#, Left: 6# 03/03/2024: Lateral Pinch: R: 6#, L: 5#, 3 pt pinch: R: 6#, L: 3#.  Eval: Lateral Pinch: 7, R 3 pt. Pinch: 4, L Lateral Pinch: 4, L 3 pt. Pinch: 5 Goal status:  04/02/24: Achieved; revised to be able to open medication bottles  6.  Pt. Will increase R wrist extension/flexion by 5 degrees in anticipation of reaching hold of items for ADLs. Baseline: 04/02/24: 52(58) 03/03/2024: R wrist ext: 30 (34), R Wrist flex: 42(48) Eval: R Wrist ext: 20(21), R Wrist flex: 30(31) Goal status: Achieved  ASSESSMENT:  CLINICAL IMPRESSION:  Patient continues to tolerate the session well with no reports of pain. Pt. Responded well to the moist heat, however continues to present with tightness in the right shoulder. PT. requires cues when reaching through multiple planes to avoid compensation proximally. Patient requires cues for visual demonstration and tactile cues for fine motor coordination and consistent cueing for bilateral hand coordination tasks. Patient was encouraged to continue using her left upper extremity and hand during tasks at home while trying to increase engagement of the right hand during more tasks. Pt. continues to benefit from OT services to work on RUE ROM, RUE strengthening, bilateral grip strength, bilateral pinch strength, bilateral [facility] skills, and Right sided awareness and attention in order to  improve functional independence of ADL/IADL tasks at home.   PERFORMANCE DEFICITS: in functional skills including ADLs, IADLs, coordination, dexterity, proprioception, sensation, tone, ROM, strength, pain, Fine motor control, Gross motor control, endurance, and UE functional use, and psychosocial skills including environmental adaptation, habits, interpersonal interactions, and routines and behaviors.   IMPAIRMENTS: are limiting patient from ADLs, IADLs, rest and sleep, work, leisure, and social participation.   CO-MORBIDITIES: may have co-morbidities  that affects occupational performance. Patient will benefit from skilled OT to address above impairments and improve overall function.  MODIFICATION OR ASSISTANCE TO COMPLETE EVALUATION: Min-Moderate modification of tasks or assist with assess necessary to complete an evaluation.  OT OCCUPATIONAL PROFILE AND HISTORY: Detailed assessment: Review of records and additional review of physical, cognitive, psychosocial history related to current functional performance.  CLINICAL DECISION MAKING: Moderate - several treatment options, min-mod task modification necessary  REHAB POTENTIAL: Good  EVALUATION COMPLEXITY: Moderate    PLAN:  OT FREQUENCY: 1x/week  OT DURATION: 12 weeks  PLANNED INTERVENTIONS: 97535 self care/ADL training, 02889 therapeutic exercise, 97530 therapeutic activity, 97112 neuromuscular re-education, 97018 paraffin, 02989 moist heat, 97010 cryotherapy, 97034 contrast bath, 97032 electrical stimulation (manual), passive range of motion, visual/perceptual remediation/compensation, energy conservation, patient/family education, and DME and/or AE instructions  RECOMMENDED OTHER SERVICES: ST & PT  CONSULTED AND AGREED WITH PLAN OF CARE: Patient and family member/caregiver  PLAN FOR NEXT SESSION: treatment   Richardson Otter, MS, OTR/L   05/14/2024, 4:42 PM

## 2024-05-14 NOTE — Therapy (Signed)
 OUTPATIENT PHYSICAL THERAPY TREATMENT  Patient Name: Haley Jones MRN: [id number] DOB:April 10, 1956, 68 y.o., female Today's Date: 05/14/2024  PCP: Dr. Oneil Jones REFERRING PROVIDER: Toribio Pitch, PA-C  END OF SESSION:   PT End of Session - 05/14/24 1409     Visit Number 31    Number of Visits 40    Date for Recertification  06/18/24    Authorization Type [employer]    Progress Note Due on Visit 30    PT Start Time 1405    PT Stop Time 1445    PT Time Calculation (min) 40 min    Equipment Utilized During Treatment Gait belt    Activity Tolerance Patient tolerated treatment well    Behavior During Therapy WFL for tasks assessed/performed                     Past Medical History:  Diagnosis Date   Adnexal mass    Anxiety    Chronic back pain    Depression    Diabetes mellitus without complication (HCC)    Endometriosis 03/12/2018   GERD (gastroesophageal reflux disease)    Heart murmur 02/2018   undetected until she was an adult. 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   Weight loss 12/06/2017   Past Surgical History:  Procedure Laterality Date   ABDOMINAL HYSTERECTOMY  2008   ovaries also   CESAREAN SECTION  1994   COLONOSCOPY     COLONOSCOPY WITH PROPOFOL  N/A 02/12/2018   Procedure: COLONOSCOPY WITH PROPOFOL ;  Surgeon: Haley Jones, Haley POUR, MD;  Location: [facility];  Service: Gastroenterology;  Laterality: N/A;   ESOPHAGOGASTRODUODENOSCOPY (EGD) WITH PROPOFOL  N/A 02/12/2018   Procedure: ESOPHAGOGASTRODUODENOSCOPY (EGD) WITH PROPOFOL ;  Surgeon: [facility]aley [facility], Haley POUR, MD;  Location: [facility];  Service: Gastroenterology;  Laterality: N/A;   EYE SURGERY Left 1973   muscle shortening to straighten cross eyes   LAPAROSCOPY N/A 02/20/2018   Procedure: LAPAROSCOPY DIAGNOSTIC, ( RESECTION OF RIGHT LOWER QUADRANT ABDOMINAL MASS);  Surgeon: Haley Selinda Birmingham, MD;   Location: [facility];  Service: General;  Laterality: N/A;   ROTATOR CUFF REPAIR Left 2011   Patient Active Problem List   Diagnosis Date Noted   Cognitive communication deficit 04/07/2024   Aphasia 04/07/2024   Urinary incontinence as sequela of cerebrovascular accident (CVA) 02/11/2024   Depression with anxiety 01/04/2024   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   Acute CVA (cerebrovascular accident) (HCC) 12/20/2023   Primary hypertension 12/20/2023   Acute kidney injury superimposed on chronic kidney disease 12/20/2023   DM type 2 with diabetic mixed hyperlipidemia (HCC) 11/17/2022   Major depressive disorder, recurrent, 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   B12 deficiency 02/23/2021   SBO (small bowel obstruction) (HCC) 01/29/2019   Barrett's esophagus without dysplasia 04/15/2018   Weight loss 12/06/2017   Hoarse 12/06/2017   Lumbar disc disease 07/26/2017   Diabetes mellitus type 2, controlled, without complications (HCC) 02/09/2015   Surgical menopause 11/26/2014    ONSET DATE: 12/20/2023  REFERRING DIAG: P36.487 (ICD-10-CM) - Left middle cerebral artery stroke (HCC)   THERAPY DIAG:  Unsteadiness on feet  Abnormality of gait  Muscle weakness (generalized)  Rationale for Evaluation and Treatment: Rehabilitation  SUBJECTIVE:  SUBJECTIVE STATEMENT:   Denies any falls since last session. Feels like she has been doing well. Walks from upstairs to clinic with [phone]. Took a few steps at home without RW and had good balance over the weekend.   PERTINENT HISTORY:Pt states she is not sure the exact date of when her CVA happened because she kept falling at home trying to care for herself and her daughter, but she [facility] went to [facility] when her sister insisted on it. Pt states her special needs daughter,  [facility]aley [facility], passed away recently while [facility] was [facility] for stroke rehabilitation.Pt's sister states pt requires supervision/cuing for ADLs  to don clothes correctly, with correct orientation (potential apraxia?) Pt/sister report pt has R side inattention.Pt reports supposedly one of her legs is shorter than the other, believes her R LE might be the shorter one. 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     PAIN:  Are you having pain? No  PRECAUTIONS: Fall RED FLAGS: None  WEIGHT BEARING RESTRICTIONS: No FALLS: Has patient fallen in last 6 months? Yes. Number of falls 6+ before going to [facility] and 1 fall since being home from [facility] - pt states she was trying to gather her clothes by herself rather than waiting for her sister to get there to help her  LIVING ENVIRONMENT Lives with: lives alone and but pt's sister, Haley [facility], comes over daily to assist with ADLs and stays to provide supervision all day - pt states at night she would be able to walk to bathroom using RW if needed (but she wears depends in event of incontinence) Lives in: House/apartment Stairs: she has steps, but she doesn't have to use them (house was wheelchair accessible for her daughter)  Has following equipment at home: Environmental consultant - 2 wheeled, shower chair, Grab bars, Ramped entry, and transport chair  PLOF: Independent, Independent with household mobility without device, Independent with homemaking with ambulation, Independent with gait, Independent with transfers, and she was the primary caregiver for her recently deceased daughter   CLOF: Sister provides supervision daily for pt to ambulate using RW safely and provides assist for ADLs (showering and dressing - progressing towards supervision with these activities); Sister states pt requires cues to turn and step back fully prior to sitting to ensure her safety  PATIENT  GOALS: get back to being able to go shopping with improved community level ambulation without the walker; return to driving   OBJECTIVE:  Note: Objective measures were completed at evaluation unless otherwise noted.  LOWER EXTREMITY MMT:    MMT Right Eval Left Eval  Hip flexion 3+, no pain 3+ with some L hip and back pain with this  Knee flexion 3+ 4  Knee extension 4- 4  Ankle dorsiflexion 3+ 4  Ankle plantarflexion 4- 4  (Blank rows = not tested)  TREATMENT DATE: 05/14/2024 -overground AMB x12.5 minutes c 1.5lb AW bilat, all inside, no device -task simulation, load lift, carry, drop off: 1.5lb AW bilat: AMb over red lava mat, blue falls mat, 2 step up/down, carrying 2kg ball, 3kg ball, 1 kgball, 1kg small ball    PATIENT EDUCATION: Education details: PT POC, findings on assessment today, recommendation to continue using RW for all functional mobility Person educated: Patient and Caregiver [facility] Education method: Explanation Education comprehension: verbalized understanding and needs further education  HOME EXERCISE PROGRAM: Access Code: [email or web address] URL: [email or web address] Date: 02/04/2024 Prepared by: Peggye Linear  Exercises - Standing March with Counter Support  - 2 x daily - 7 x weekly - 2 sets - 20 reps - Side stepping with counter support: yellow band loop  - 2 x daily - 7 x weekly - 2 sets - 20 reps - Sit to Stand with Counter Support  - 2 x daily - 7 x weekly - 2 sets - 12 reps   GOALS: Goals reviewed with patient? Yes  SHORT TERM GOALS: Target date: 03/10/2024  Pt will be independent with HEP in order to improve strength and balance in order to decrease fall risk and improve function at home and work.  Baseline: need to initiate7/16: 2-3x week Goal status:  MET  LONG TERM GOALS: Target date: 06/19/2024  1.  Patient will  complete five times sit to stand test in < 15 seconds indicating an increased LE strength and improved balance. Baseline:  38.75 seconds, using UE support, 7/16: 20.35 sec, UE support on 1st STS only 8/20:16.86 sec no UE support 9/10:12.16 sec  Goal Status: MET   2.  Patient will increase ABC scale score >50% to demonstrate better functional mobility and better confidence with ADLs.   Baseline: 7.5%, 7/16: 18.125% 8/20: 18.75% 9/10: 16.25% Goal status: ONGOING/ REGRESSED/ ALTERED   3.  Patient will increase Berg Balance score by > 6 points to demonstrate decreased fall risk during functional activities. Baseline: need to assess; 01/30/2024= 34/56, 7/16= 39 8/20:42 Goal status: MET   4. Patient will increase 10 meter walk test to >1.43m/s as to improve gait speed for better community ambulation and to reduce fall risk. Baseline: 0.48 m/s using youth RW, requires CGA for safety, 7/16: 0.69 m/s 8/20:  .76 m/s 9/10:.70 m/s Goal status: ONGOING  5. Patient will increase six minute walk test distance to >1000 for progression to community ambulator and improve gait ability Baseline: need to assess; 01/30/2024=410 feet using RW; 02/13/24: 490ft in 96m30s, 7/16: 685ft using 2WW 8/20: 900 ft with RW  9/29: 835 ft with 4WW  Goal status: ONGOING  6.  Patient will improve dynamic gait index score by 4 points or greater to indicate improvement in gait with a rolling walker as well as decreased risk for falls Baseline:9/17:10 Goal status: INITIAL  7.  Pt will report 70% or greater confidence when ambulating around her home and completing her usual daily activities with LRAD.  Baseline: 30-50%  Goal status: INITIAL    ASSESSMENT:  CLINICAL IMPRESSION:   Continued to advance overground AMB in a smorgasbord of situations. Pt able to navigate LL of facility x 12.5 minutes with ankle weights, has intermittent LOB eventually. Visual nvaigation about obstacles remains questionable at times. Patient presents  with good motivation for physical therapy interventions.  Patient's goals were reassessed  Pt will continue to benefit from skilled physical therapy intervention to address impairments, improve QOL, and attain therapy goals.   OBJECTIVE IMPAIRMENTS: Abnormal  gait, decreased activity tolerance, decreased balance, decreased endurance, decreased knowledge of use of DME, decreased mobility, difficulty walking, decreased strength, decreased safety awareness, impaired vision/preception, and pain.   ACTIVITY LIMITATIONS: carrying, lifting, bending, standing, squatting, stairs, transfers, bed mobility, bathing, toileting, dressing, reach over head, hygiene/grooming, and locomotion level  PARTICIPATION LIMITATIONS: meal prep, cleaning, laundry, driving, shopping, and community activity  PERSONAL FACTORS: Age, Time since onset of injury/illness/exacerbation, and 3+ comorbidities: Hypertension, B12 deficiency, type 2 diabetes, hyperlipidemia, CKD stage IIIa are also affecting patient's functional outcome.   REHAB POTENTIAL: Good  CLINICAL DECISION MAKING: Evolving/moderate complexity  EVALUATION COMPLEXITY: Moderate  PLAN:  PT FREQUENCY: 1-2x/week  PT DURATION: 8 weeks  PLANNED INTERVENTIONS: 97164- PT Re-evaluation, 97750- Physical Performance Testing, 97110-Therapeutic exercises, 97530- Therapeutic activity, [id number]- Neuromuscular re-education, 97535- Self Care, [id number]- Manual therapy, [id number]- Gait training, [phone]- Orthotic Initial, [phone]- Orthotic/Prosthetic subsequent, [phone]- Canalith repositioning, [phone]- Electrical stimulation (manual), Patient/Family education, Balance training, Stair training, Taping, Joint mobilization, Spinal mobilization, Vestibular training, Visual/preceptual remediation/compensation, DME instructions, Cryotherapy, Moist heat, and Biofeedback  PLAN FOR NEXT SESSION:  - continue gait training without AD for balance and righting (include multi plane, multidirectional)  -  reaching to floor to pick things up without UE assist for improvement in functional activities.    2:26 PM, 05/14/24 Peggye JAYSON Linear, PT, DPT Physical Therapist - [facility]  Outpatient Physical Therapy- Main [facility] [phone]

## 2024-05-19 ENCOUNTER — Ambulatory Visit: Admitting: Speech Pathology

## 2024-05-19 ENCOUNTER — Ambulatory Visit: Admitting: Physical Therapy

## 2024-05-19 ENCOUNTER — Ambulatory Visit: Admitting: Occupational Therapy

## 2024-05-19 DIAGNOSIS — R269 Unspecified abnormalities of gait and mobility: Secondary | ICD-10-CM

## 2024-05-19 DIAGNOSIS — H543 Unqualified visual loss, both eyes: Secondary | ICD-10-CM

## 2024-05-19 DIAGNOSIS — R2681 Unsteadiness on feet: Secondary | ICD-10-CM | POA: Diagnosis not present

## 2024-05-19 DIAGNOSIS — M6281 Muscle weakness (generalized): Secondary | ICD-10-CM

## 2024-05-19 NOTE — Therapy (Signed)
 OUTPATIENT PHYSICAL THERAPY TREATMENT  Patient Name: Haley [facility] MRN: [id number] DOB:[birth year], 68 y.o., female Today's Date: 05/19/2024  PCP: Dr. Oneil Pinal REFERRING PROVIDER: Toribio Pitch, PA-C  END OF SESSION:   PT End of Session - 05/19/24 1423     Visit Number 32    Number of Visits 40    Date for Recertification  06/18/24    Authorization Type [employer]    Progress Note Due on Visit 40    PT Start Time 1402    PT Stop Time 1441    PT Time Calculation (min) 39 min    Equipment Utilized During Treatment Gait belt    Activity Tolerance Patient tolerated treatment well    Behavior During Therapy WFL for tasks assessed/performed                     Past Medical History:  Diagnosis Date   Adnexal mass    Anxiety    Chronic back pain    Depression    Diabetes mellitus without complication (HCC)    Endometriosis 03/12/2018   GERD (gastroesophageal reflux disease)    Heart murmur 02/2018   undetected until she was an adult. 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   Weight loss 12/06/2017   Past Surgical History:  Procedure Laterality Date   ABDOMINAL HYSTERECTOMY  2008   ovaries also   CESAREAN SECTION  1994   COLONOSCOPY     COLONOSCOPY WITH PROPOFOL  N/A 02/12/2018   Procedure: COLONOSCOPY WITH PROPOFOL ;  Surgeon: Toledo, Ladell POUR, MD;  Location: [facility];  Service: Gastroenterology;  Laterality: N/A;   ESOPHAGOGASTRODUODENOSCOPY (EGD) WITH PROPOFOL  N/A 02/12/2018   Procedure: ESOPHAGOGASTRODUODENOSCOPY (EGD) WITH PROPOFOL ;  Surgeon: Toledo, Ladell POUR, MD;  Location: [facility];  Service: Gastroenterology;  Laterality: N/A;   EYE SURGERY Left 1973   muscle shortening to straighten cross eyes   LAPAROSCOPY N/A 02/20/2018   Procedure: LAPAROSCOPY DIAGNOSTIC, ( RESECTION OF RIGHT LOWER QUADRANT ABDOMINAL MASS);  Surgeon: Nicholaus Selinda Birmingham, MD;   Location: [facility];  Service: General;  Laterality: N/A;   ROTATOR CUFF REPAIR Left 2011   Patient Active Problem List   Diagnosis Date Noted   Cognitive communication deficit 04/07/2024   Aphasia 04/07/2024   Urinary incontinence as sequela of cerebrovascular accident (CVA) 02/11/2024   Depression with anxiety 01/04/2024   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   Acute CVA (cerebrovascular accident) (HCC) 12/20/2023   Primary hypertension 12/20/2023   Acute kidney injury superimposed on chronic kidney disease 12/20/2023   DM type 2 with diabetic mixed hyperlipidemia (HCC) 11/17/2022   Major depressive disorder, recurrent, 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  Rationale for Evaluation and Treatment: Rehabilitation  SUBJECTIVE:  SUBJECTIVE STATEMENT:   Denies any falls since last session. Feels like she has been doing well. Walks from upstairs to clinic with [phone]. Took a few steps at home without RW and had good balance over the weekend.   PERTINENT HISTORY:Pt states she is not sure the exact date of when her CVA happened because she kept falling at home trying to care for herself and her daughter, but she finally went to the hospital when her sister insisted on it. Pt states her special needs daughter, [name], passed away recently while pt was in the hospital  at [facility] before she was transferred to [facility] for stroke rehabilitation.Pt's sister states pt requires supervision/cuing for ADLs  to don clothes correctly, with correct orientation (potential apraxia?) Pt/sister report pt has R side inattention.Pt reports supposedly one of her legs is shorter than the other, believes her R LE might be the shorter one. 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     PAIN:  Are you having pain? No  PRECAUTIONS: Fall RED FLAGS: None  WEIGHT BEARING RESTRICTIONS: No FALLS: Has patient fallen in last 6 months? Yes. Number of falls 6+ before going to hospital and 1 fall since being home from [facility] - pt states she was trying to gather her clothes by herself rather than waiting for her sister to get there to help her  LIVING ENVIRONMENT Lives with: lives alone and but pt's sister, [name], comes over daily to assist with ADLs and stays to provide supervision all day - pt states at night she would be able to walk to bathroom using RW if needed (but she wears depends in event of incontinence) Lives in: House/apartment Stairs: she has steps, but she doesn't have to use them (house was wheelchair accessible for her daughter)  Has following equipment at home: Environmental consultant - 2 wheeled, shower chair, Grab bars, Ramped entry, and transport chair  PLOF: Independent, Independent with household mobility without device, Independent with homemaking with ambulation, Independent with gait, Independent with transfers, and she was the primary caregiver for her recently deceased daughter   CLOF: Sister provides supervision daily for pt to ambulate using RW safely and provides assist for ADLs (showering and dressing - progressing towards supervision with these activities); Sister states pt requires cues to turn and step back fully prior to sitting to ensure her safety  PATIENT GOALS: get back to being able to go shopping with  improved community level ambulation without the walker; return to driving   OBJECTIVE:  Note: Objective measures were completed at evaluation unless otherwise noted.  LOWER EXTREMITY MMT:    MMT Right Eval Left Eval  Hip flexion 3+, no pain 3+ with some L hip and back pain with this  Knee flexion 3+ 4  Knee extension 4- 4  Ankle dorsiflexion 3+ 4  Ankle plantarflexion 4- 4  (Blank rows = not tested)  TREATMENT DATE: 05/19/2024 TA- To improve functional movements patterns for everyday tasks   Pt challenged to ambulate outside terrain some with 4WW and some without. 1 LOB with quick turning reaction corrected with stepping strategy x 23 min   STS holding 9 # dumbbell and overhead press 2 x 10 reps with feet on airex pad   Ascending and descending steps x 6 rounds, cues for step over step pattern  Forward and retro walking x 15 ft x 3 rounds    PATIENT EDUCATION: Education details: PT POC, findings on assessment today, recommendation to continue using RW for all functional mobility Person educated: Patient and Caregiver [name] Education method: Explanation Education comprehension: verbalized understanding and needs further education  HOME EXERCISE PROGRAM: Access Code: [email or web address] URL: [email or web address] Date: 02/04/2024 Prepared by: Peggye Linear  Exercises - Standing March with Counter Support  - 2 x daily - 7 x weekly - 2 sets - 20 reps - Side stepping with counter support: yellow band loop  - 2 x daily - 7 x weekly - 2 sets - 20 reps - Sit to Stand with Counter Support  - 2 x daily - 7 x weekly - 2 sets - 12 reps   GOALS: Goals reviewed with patient? Yes  SHORT TERM GOALS: Target date: 03/10/2024  Pt will be independent with HEP in order to improve strength and balance in order to decrease fall risk and improve function at home and  work.  Baseline: need to initiate7/16: 2-3x week Goal status:  MET  LONG TERM GOALS: Target date: 06/19/2024  1.  Patient will complete five times sit to stand test in < 15 seconds indicating an increased LE strength and improved balance. Baseline:  38.75 seconds, using UE support, 7/16: 20.35 sec, UE support on 1st STS only 8/20:16.86 sec no UE support 9/10:12.16 sec  Goal Status: MET   2.  Patient will increase ABC scale score >50% to demonstrate better functional mobility and better confidence with ADLs.   Baseline: 7.5%, 7/16: 18.125% 8/20: 18.75% 9/10: 16.25% Goal status: ONGOING/ REGRESSED/ ALTERED   3.  Patient will increase Berg Balance score by > 6 points to demonstrate decreased fall risk during functional activities. Baseline: need to assess; 01/30/2024= 34/56, 7/16= 39 8/20:42 Goal status: MET   4. Patient will increase 10 meter walk test to >1.55m/s as to improve gait speed for better community ambulation and to reduce fall risk. Baseline: 0.48 m/s using youth RW, requires CGA for safety, 7/16: 0.69 m/s 8/20:  .76 m/s 9/10:.70 m/s Goal status: ONGOING  5. Patient will increase six minute walk test distance to >1000 for progression to community ambulator and improve gait ability Baseline: need to assess; 01/30/2024=410 feet using RW; 02/13/24: 420ft in 33m30s, 7/16: 629ft using 2WW 8/20: 900 ft with RW  9/29: 835 ft with 4WW  Goal status: ONGOING  6.  Patient will improve dynamic gait index score by 4 points or greater to indicate improvement in gait with a rolling walker as well as decreased risk for falls Baseline:9/17:10 Goal status: INITIAL  7.  Pt will report 70% or greater confidence when ambulating around her home and completing her usual daily activities with LRAD.  Baseline: 30-50%  Goal status: INITIAL    ASSESSMENT:  CLINICAL IMPRESSION:   Pt progressed with outdoor ambulation without AD for long durations this date. Pt attention to ground was improved and she  readily made adaption to her gait to account for objects on ground showing  improved awareness  of her surroundings. Pt also challenged with step over step pattern of gait this date on stairs, poor control with stairs eccentrically that improved with practice but could still be further refined. Pt will continue to benefit from skilled physical therapy intervention to address impairments, improve QOL, and attain therapy goals.   OBJECTIVE IMPAIRMENTS: Abnormal gait, decreased activity tolerance, decreased balance, decreased endurance, decreased knowledge of use of DME, decreased mobility, difficulty walking, decreased strength, decreased safety awareness, impaired vision/preception, and pain.   ACTIVITY LIMITATIONS: carrying, lifting, bending, standing, squatting, stairs, transfers, bed mobility, bathing, toileting, dressing, reach over head, hygiene/grooming, and locomotion level  PARTICIPATION LIMITATIONS: meal prep, cleaning, laundry, driving, shopping, and community activity  PERSONAL FACTORS: Age, Time since onset of injury/illness/exacerbation, and 3+ comorbidities: Hypertension, B12 deficiency, type 2 diabetes, hyperlipidemia, CKD stage IIIa are also affecting patient's functional outcome.   REHAB POTENTIAL: Good  CLINICAL DECISION MAKING: Evolving/moderate complexity  EVALUATION COMPLEXITY: Moderate  PLAN:  PT FREQUENCY: 1-2x/week  PT DURATION: 8 weeks  PLANNED INTERVENTIONS: 97164- PT Re-evaluation, 97750- Physical Performance Testing, 97110-Therapeutic exercises, 97530- Therapeutic activity, [id number]- Neuromuscular re-education, 97535- Self Care, 02859- Manual therapy, [id number]- Gait training, [phone]- Orthotic Initial, [phone]- Orthotic/Prosthetic subsequent, [phone]- Canalith repositioning, [phone]- Electrical stimulation (manual), Patient/Family education, Balance training, Stair training, Taping, Joint mobilization, Spinal mobilization, Vestibular training, Visual/preceptual  remediation/compensation, DME instructions, Cryotherapy, Moist heat, and Biofeedback  PLAN FOR NEXT SESSION:  - continue gait training without AD for balance and righting (include multi plane, multidirectional)  - reaching to floor to pick things up without UE assist for improvement in functional activities.    2:24 PM, 05/19/24 Note: Portions of this document were prepared using Dragon voice recognition software and although reviewed may contain unintentional dictation errors in syntax, grammar, or spelling.  Lonni KATHEE Gainer PT ,DPT Physical Therapist- [place]  [facility]

## 2024-05-21 ENCOUNTER — Ambulatory Visit: Admitting: Occupational Therapy

## 2024-05-21 ENCOUNTER — Ambulatory Visit: Admitting: Physical Therapy

## 2024-05-21 ENCOUNTER — Ambulatory Visit: Admitting: Speech Pathology

## 2024-05-21 DIAGNOSIS — R2681 Unsteadiness on feet: Secondary | ICD-10-CM

## 2024-05-21 DIAGNOSIS — H543 Unqualified visual loss, both eyes: Secondary | ICD-10-CM

## 2024-05-21 DIAGNOSIS — R269 Unspecified abnormalities of gait and mobility: Secondary | ICD-10-CM

## 2024-05-21 DIAGNOSIS — M6281 Muscle weakness (generalized): Secondary | ICD-10-CM

## 2024-05-21 NOTE — Therapy (Signed)
 Occupational Therapy Neuro Treatment Note   Patient Name: Haley Jones MRN: [id number] DOB:[birth year], 68 y.o., female Today's Date: 05/21/2024  PCP: Cleotilde Oneil FALCON, MD REFERRING PROVIDER: Pegge Toribio PARAS, PA-C   OT End of Session - 05/21/24 1714     Visit Number 27    Number of Visits 48    Date for Recertification  06/25/24    OT Start Time 1445    OT Stop Time 1530    OT Time Calculation (min) 45 min    Activity Tolerance Patient tolerated treatment well    Behavior During Therapy [facility] for tasks assessed/performed                  Past Medical History:  Diagnosis Date   Adnexal mass    Anxiety    Chronic back pain    Depression    Diabetes mellitus without complication (HCC)    Endometriosis 03/12/2018   GERD (gastroesophageal reflux disease)    Heart murmur 02/2018   undetected until she was an adult. 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   Weight loss 12/06/2017   Past Surgical History:  Procedure Laterality Date   ABDOMINAL HYSTERECTOMY  2008   ovaries also   CESAREAN SECTION  1994   COLONOSCOPY     COLONOSCOPY WITH PROPOFOL  N/A 02/12/2018   Procedure: COLONOSCOPY WITH PROPOFOL ;  Surgeon: Toledo, Ladell POUR, MD;  Location: [facility] ENDOSCOPY;  Service: Gastroenterology;  Laterality: N/A;   ESOPHAGOGASTRODUODENOSCOPY (EGD) WITH PROPOFOL  N/A 02/12/2018   Procedure: ESOPHAGOGASTRODUODENOSCOPY (EGD) WITH PROPOFOL ;  Surgeon: Toledo, Ladell POUR, MD;  Location: [facility] ENDOSCOPY;  Service: Gastroenterology;  Laterality: N/A;   EYE SURGERY Left 1973   muscle shortening to straighten cross eyes   LAPAROSCOPY N/A 02/20/2018   Procedure: LAPAROSCOPY DIAGNOSTIC, ( RESECTION OF RIGHT LOWER QUADRANT ABDOMINAL MASS);  Surgeon: Nicholaus Selinda Birmingham, MD;  Location: [facility] ORS;  Service: General;  Laterality: N/A;   ROTATOR CUFF REPAIR Left 2011   Patient Active Problem List    Diagnosis Date Noted   Depression with anxiety 01/04/2024   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   Acute CVA (cerebrovascular accident) (HCC) 12/20/2023   Primary hypertension 12/20/2023   Acute kidney injury superimposed on chronic kidney disease (HCC) 12/20/2023   DM type 2 with diabetic mixed hyperlipidemia (HCC) 11/17/2022   Major depressive disorder, recurrent, 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  Rationale for Evaluation and Treatment: Rehabilitation  SUBJECTIVE:   SUBJECTIVE STATEMENT:  Pt. reports having had a family reunion in [place]  this past weekend. Pt accompanied by: self  PERTINENT HISTORY: Pt. Is a 68 yo female with hx of a L MCA CVA. Pt. Was admitted into ED on 12/20/2023 with an onset of RUE weakness, numbness dysmetria, and visual deficits. Upon assessment, it was concluded that Pt. Experienced multiple infarcts in the L Parietal/Occipital region. PMHx: Dizziness, Falls  PRECAUTIONS: None  WEIGHT BEARING RESTRICTIONS: No  PAIN:  Are you having pain?  no  05/14/24: No  pain 05/07/24: No pain 04/23/24: No pain 04/16/24: No pain 04/02/24: No pain 03/12/2024: 6/10 pain at the R dorsal forearm during gross grip strengthening task. 03/10/2024: No pain today. 03/05/2024: No pain, 2-3/10 level of discomfort during use of green resisitve clips.    FALLS: Has patient fallen in last 6 months? Yes. Number of falls 10  LIVING ENVIRONMENT: Lives with: lives with their family and lives alone Lives in: House/apartment Stairs: No, Ramp Has following equipment at  home: Walker - 2 wheeled, Shower bench, and bed side commode  PLOF: Independent  PATIENT GOALS: To be able to feed herself and hold a fork and knife better.   OBJECTIVE:  Note: Objective measures were completed at Evaluation unless otherwise noted.  HAND DOMINANCE: Left  ADLs: Overall ADLs:  Transfers/ambulation related to ADLs:  Eating: Independent  Grooming: independent UB Dressing: Pt. Has difficulty managing buttons LB Dressing: jean zippers are difficult to manipulate, hiking pants, and clothing negotiation are difficult. Toileting: Independent Bathing: Independent Tub Shower transfers: stepping into shower is difficult.  Equipment: Grab bars, shower bench, bedside commode  IADLs: Shopping: Does not currently do that/has not tried Light housekeeping: Does not currently do that/has not tried Meal Prep: Has not cooked in months, did not prior to CVA, sister provides meals. Community mobility: Relies on family, and friends Medication management: Assistance from sister to initiate medication management, she gives herself a shot for diabetes Financial management: Sister is assisting with monthly bill management Handwriting: Cursive form is 10% legible; Printed form is 90% legible  MOBILITY STATUS: Needs Assist: CGA and Hx of falls  POSTURE COMMENTS:  No Significant postural limitations Sitting balance: Good  ACTIVITY TOLERANCE: Activity tolerance: Good  FUNCTIONAL OUTCOME MEASURES:   UPPER EXTREMITY ROM:    Active ROM Right eval Right 03/03/2024 Right  04/02/24 Left Eval [facility]  Shoulder flexion 50(58) 61(74) 62(76)   Shoulder abduction 43(48) 45(58) 52(58)   Shoulder adduction      Shoulder extension      Shoulder internal rotation      Shoulder external rotation      Elbow flexion 118(131) 125(128) 142(144)   Elbow extension -24(-22) -5(0) 0(0)   Wrist flexion 20 (21) 42(48) 64(72)   Wrist extension 30(31) 30(34) 52(58)   Wrist ulnar deviation      Wrist  radial deviation      Wrist pronation      Wrist supination      (Blank rows = not tested)  UPPER EXTREMITY MMT:     MMT Right eval Right 03/03/2024 Right 04/02/24 Left eval  Shoulder flexion 2/5 2/5 2/5 WFL  Shoulder abduction 2/5 2/5 2/5 [facility]  Shoulder adduction      Shoulder extension      Shoulder internal rotation      Shoulder external rotation      Middle trapezius      Lower trapezius      Elbow flexion 4-/5 4/5 4/5 WFL  Elbow extension 4-/5 4/5 4/5 WFL  Wrist flexion 2+/5 4-/5 4-/5 WFL  Wrist extension 3-/5 4-/5 4-/5 WFL  Wrist ulnar deviation      Wrist radial deviation      Wrist pronation      Wrist supination      (Blank rows = not tested)  HAND FUNCTION: Grip strength: Right: 21 lbs; Left: 14 lbs, Lateral pinch: Right: 7 lbs, Left: 4 lbs, and 3 point pinch: Right: 4 lbs, Left: 5 lbs  03/03/2024:  Grip strength: Right: 23#,  Left: 28#, Lateral pinch: Right: 6#, Left: 5#, 3 point pinch: Right: 6#, Left: 3#  04/02/24:  Grip strength: Right: 31#, Left: 35#, Lateral pinch: Right: 8#, Left: 7#, 3 point pinch: Right: 8#, Left: 6#   COORDINATION: 9 Hole Peg test: Right: 72 sec; Left: 40 sec  03/03/2024: 9 hole peg test: Right: 1 min 3 sec, Left: 36 sec  04/02/24  9 hole peg test: Right: 53 sec, Left: 30 sec    SENSATION: Light touch: Impaired  Proprioception: Impaired   EDEMA: none  MUSCLE TONE: RUE: Mild  COGNITION: Overall cognitive status: Impaired  VISION: Subjective report: Pt. has not noticed the changes in vision but her sister has. Caregiver reports that Pt. Does not bring much attention to the R side.  Baseline vision:  Visual history:   VISION ASSESSMENT: TBD   PERCEPTION: TBD  PRAXIS: Impaired: Motor planning                                                                                                                           TREATMENT DATE: 05/21/2024   Manual therapy:  -STM to the right scapula  for elevation, depression,  abduction, and rotation in conjunction with moist heat modality. -Manual therapy was performed independent of, and in preparation for therapeutic Ex.    There. Ex.:  -Pt. tolerated AROM followed by PROM/AAROM to the end ROM for right shoulder flexion, and abduction, horizontal shoulder abduction, and external rotation following moist heat modality. -AAROM for shoulder flexion, and diagonal abduction at the tabletop surface.  Therapeutic Act.:   -facilitated functional reaching with the RUE, and hand using the shape tower to move flat shapes through 2 progressively increasing vertical dowels, followed by transitioning to functional reaching moving [place] rings through 2 progressively increasing horizontal dowel heights with support provided proximally 2/2 compensation, and stiffness.     PATIENT EDUCATION: Education details: visual perceptual skills, visual compensatory strategies , RUE weakness, BUE functioning,  Right shoulder ROM Person educated: Patient and Sister Education method: Explanation, Demonstration, Tactile cues, and Verbal cues Education comprehension: verbalized understanding and returned demonstration  HOME EXERCISE PROGRAM: Yellow theraputty HEP   GOALS: Goals reviewed with patient? Yes  SHORT TERM GOALS: Target date: 03/10/2024    Pt. Will be independent utilizing HEPs for hand strengthening exercises.  Baseline: Eval: No current HEP program Goal status: INITIAL   LONG TERM GOALS: Target date: 04/21/2024   Pt. Will improve BUE grip strength by 5# of force to be able to open jars. Baseline: 04/02/24: Grip strength: Right: 31#, Left: 35# 03/03/2024: R grip strength: 23#, L grip strength: 28#. Pt. has difficulty securely holding items in her hand. Eval: R grip strength: 21#, L grip strength: 14# Goal status: 04/02/24 achieved; revised to open jars   2.  Pt. Will improve RUE [facility] skills by 3 sec. to be able to manipulate  small objects. Baseline: 04/02/24: 9 hole peg  test: Right: 53 sec, Left: 30 sec 03/03/2024: 9  hole peg test: R side: 1 min 3 sec, L side: 36 sec. 9 hole peg test; R side: 72 secs, L side: 40 secs Goal status: progressing, revised 8/20 for manipulating small objects.  3.  Pt. Will increase R shoulder flex/shoulder ABD ROM by 10 degrees to perform ADLs/IADLs.  Baseline: 04/02/24: Right shoulder flexion: 62(76), Abduction: 52(58) 03/03/2024: R shoulder flex: 61(74), R shoulder ABD: 45(58) R shoulder flex: 50(58), R shoulder ABD: 43(48) Goal status: progressing, ongoing  4.  Pt. Will improve handwriting to 50% legible in cursive form, legibility to be able to send written correspondence Baseline: 03/03/2024: 50% legible in cursive form, Eval: 10% legible in cursive form, 90% legible in printed form. Goal status: Discontinue goal per Pt. report that it has returned to baseline.  5.  Pt. Will improve BUE pinch strength by 2# of force to  be able to open medication bottles Baseline: 04/02/24: Lateral pinch: Right: 8#, Left: 7#, 3 point pinch: Right: 8#, Left: 6# 03/03/2024: Lateral Pinch: R: 6#, L: 5#, 3 pt pinch: R: 6#, L: 3#.  Eval: Lateral Pinch: 7, R 3 pt. Pinch: 4, L Lateral Pinch: 4, L 3 pt. Pinch: 5 Goal status:  04/02/24: Achieved; revised to be able to open medication bottles  6.  Pt. Will increase R wrist extension/flexion by 5 degrees in anticipation of reaching hold of items for ADLs. Baseline: 04/02/24: 52(58) 03/03/2024: R wrist ext: 30 (34), R Wrist flex: 42(48) Eval: R Wrist ext: 20(21), R Wrist flex: 30(31) Goal status: Achieved  ASSESSMENT:  CLINICAL IMPRESSION:  Pt. reports that she now has multiple overhead bras that she can wear, and is happy with as she does not need to be able to manipulate small fasteners for them.   Pt. requires cues, assist and support for the RUE to avoid compensation proximally 2/2 stiffness. With assist, Pt. was able to acieve reaching further to higher heights. Pt. continues to benefit from OT services to  work on RUE ROM, RUE strengthening, bilateral grip strength, bilateral pinch strength, bilateral [facility] skills, and Right sided awareness and attention in order to improve functional independence of ADL/IADL tasks at home.   PERFORMANCE DEFICITS: in functional skills including ADLs, IADLs, coordination, dexterity, proprioception, sensation, tone, ROM, strength, pain, Fine motor control, Gross motor control, endurance, and UE functional use, and psychosocial skills including environmental adaptation, habits, interpersonal interactions, and routines and behaviors.   IMPAIRMENTS: are limiting patient from ADLs, IADLs, rest and sleep, work, leisure, and social participation.   CO-MORBIDITIES: may have co-morbidities  that affects occupational performance. Patient will benefit from skilled OT to address above impairments and improve overall function.  MODIFICATION OR ASSISTANCE TO COMPLETE EVALUATION: Min-Moderate modification of tasks or assist with assess necessary to complete an evaluation.  OT OCCUPATIONAL PROFILE AND HISTORY: Detailed assessment: Review of records and additional review of physical, cognitive, psychosocial history related to current functional performance.  CLINICAL DECISION MAKING: Moderate - several treatment options, min-mod task modification necessary  REHAB POTENTIAL: Good  EVALUATION COMPLEXITY: Moderate    PLAN:  OT FREQUENCY: 1x/week  OT DURATION: 12 weeks  PLANNED INTERVENTIONS: 97535 self care/ADL training, 02889 therapeutic exercise, 97530 therapeutic activity, 97112 neuromuscular re-education, 97018 paraffin, 02989 moist heat, 97010 cryotherapy, 97034 contrast bath, 97032 electrical stimulation (manual), passive range of motion, visual/perceptual remediation/compensation, energy conservation, patient/family education, and DME and/or AE instructions  RECOMMENDED OTHER SERVICES: ST & PT  CONSULTED AND AGREED WITH PLAN OF CARE: Patient and family  member/caregiver  PLAN FOR NEXT SESSION:  treatment   Nikeia Henkes, MS, OTR/L   05/21/2024, 5:17 PM

## 2024-05-21 NOTE — Therapy (Signed)
 OUTPATIENT PHYSICAL THERAPY TREATMENT  Patient Name: Haley Jones MRN: [id number] DOB:01/12/1956, 68 y.o., female Today's Date: 05/21/2024  PCP: Dr. Oneil Jones REFERRING PROVIDER: Toribio Pitch, PA-C  END OF SESSION:   PT End of Session - 05/21/24 1420     Visit Number 33    Number of Visits 40    Date for Recertification  06/18/24    Authorization Type [employer]    Progress Note Due on Visit 40    PT Start Time 1403    PT Stop Time 1443    PT Time Calculation (min) 40 min    Equipment Utilized During Treatment Gait belt    Activity Tolerance Patient tolerated treatment well    Behavior During Therapy WFL for tasks assessed/performed                      Past Medical History:  Diagnosis Date   Adnexal mass    Anxiety    Chronic back pain    Depression    Diabetes mellitus without complication (HCC)    Endometriosis 03/12/2018   GERD (gastroesophageal reflux disease)    Heart murmur 02/2018   undetected until she was an adult. 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   Weight loss 12/06/2017   Past Surgical History:  Procedure Laterality Date   ABDOMINAL HYSTERECTOMY  2008   ovaries also   CESAREAN SECTION  1994   COLONOSCOPY     COLONOSCOPY WITH PROPOFOL  N/A 02/12/2018   Procedure: COLONOSCOPY WITH PROPOFOL ;  Surgeon: Haley Jones;  Location: [facility];  Service: Gastroenterology;  Laterality: N/A;   ESOPHAGOGASTRODUODENOSCOPY (EGD) WITH PROPOFOL  N/A 02/12/2018   Procedure: ESOPHAGOGASTRODUODENOSCOPY (EGD) WITH PROPOFOL ;  Surgeon: Haley Jones;  Location: [facility];  Service: Gastroenterology;  Laterality: N/A;   EYE SURGERY Left 1973   muscle shortening to straighten cross eyes   LAPAROSCOPY N/A 02/20/2018   Procedure: LAPAROSCOPY DIAGNOSTIC, ( RESECTION OF RIGHT LOWER QUADRANT ABDOMINAL MASS);  Surgeon: Haley Selinda Birmingham,  Jones;  Location: [facility];  Service: General;  Laterality: N/A;   ROTATOR CUFF REPAIR Left 2011   Patient Active Problem List   Diagnosis Date Noted   Cognitive communication deficit 04/07/2024   Aphasia 04/07/2024   Urinary incontinence as sequela of cerebrovascular accident (CVA) 02/11/2024   Depression with anxiety 01/04/2024   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   Acute CVA (cerebrovascular accident) (HCC) 12/20/2023   Primary hypertension 12/20/2023   Acute kidney injury superimposed on chronic kidney disease 12/20/2023   DM type 2 with diabetic mixed hyperlipidemia (HCC) 11/17/2022   Major depressive disorder, recurrent, 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   B12 deficiency 02/23/2021   SBO (small bowel obstruction) (HCC) 01/29/2019   Barrett's esophagus without dysplasia 04/15/2018   Weight loss 12/06/2017   Hoarse 12/06/2017   Lumbar disc disease 07/26/2017   Diabetes mellitus type 2, controlled, without complications (HCC) 02/09/2015   Surgical menopause 11/26/2014    ONSET DATE: 12/20/2023  REFERRING DIAG: P36.487 (ICD-10-CM) - Left middle cerebral artery stroke (HCC)   THERAPY DIAG:  Muscle weakness (generalized)  Unsteadiness on feet  Abnormality of gait  Low vision, both eyes  Rationale for Evaluation and Treatment: Rehabilitation  SUBJECTIVE:  SUBJECTIVE STATEMENT:   Denies any falls since last session. Feels like she has been doing well. Walks from upstairs to clinic with [phone].   PERTINENT HISTORY:Pt states she is not sure the exact date of when her CVA happened because she kept falling at home trying to care for herself and her daughter, but she finally went to the hospital when her sister insisted on it. Pt states her special needs daughter, Haley Jones, passed away recently while pt was in  the hospital at [facility] before she was transferred to [facility] for stroke rehabilitation.Pt's sister states pt requires supervision/cuing for ADLs  to don clothes correctly, with correct orientation (potential apraxia?) Pt/sister report pt has R side inattention.Pt reports supposedly one of her legs is shorter than the other, believes her R LE might be the shorter one. 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     PAIN:  Are you having pain? No  PRECAUTIONS: Fall RED FLAGS: None  WEIGHT BEARING RESTRICTIONS: No FALLS: Has patient fallen in last 6 months? Yes. Number of falls 6+ before going to hospital and 1 fall since being home from CIR - pt states she was trying to gather her clothes by herself rather than waiting for her sister to get there to help her  LIVING ENVIRONMENT Lives with: lives alone and but pt's sister, Haley Jones, comes over daily to assist with ADLs and stays to provide supervision all day - pt states at night she would be able to walk to bathroom using RW if needed (but she wears depends in event of incontinence) Lives in: House/apartment Stairs: she has steps, but she doesn't have to use them (house was wheelchair accessible for her daughter)  Has following equipment at home: Environmental consultant - 2 wheeled, shower chair, Grab bars, Ramped entry, and transport chair  PLOF: Independent, Independent with household mobility without device, Independent with homemaking with ambulation, Independent with gait, Independent with transfers, and she was the primary caregiver for her recently deceased daughter   CLOF: Sister provides supervision daily for pt to ambulate using RW safely and provides assist for ADLs (showering and dressing - progressing towards supervision with these activities); Sister states pt requires cues to turn and step back fully prior to sitting to ensure her safety  PATIENT GOALS: get back to being able to go  shopping with improved community level ambulation without the walker; return to driving   OBJECTIVE:  Note: Objective measures were completed at evaluation unless otherwise noted.  LOWER EXTREMITY MMT:    MMT Right Eval Left Eval  Hip flexion 3+, no pain 3+ with some L hip and back pain with this  Knee flexion 3+ 4  Knee extension 4- 4  Ankle dorsiflexion 3+ 4  Ankle plantarflexion 4- 4  (Blank rows = not tested)  TREATMENT DATE: 05/21/2024 TA- To improve functional movements patterns for everyday tasks   Pt challenged to ambulate outside terrain some with 4WW and some without. Ambulates on grass surface for approx 50 ft with turns without LOB and in area requiring many slight and abrupt turns without LOB x17  min   STS holding 3KG ball and overhead press 2 x 10 reps with feet on airex pad   NMR: To facilitate reeducation of movement, balance, posture, coordination, and/or proprioception/kinesthetic sense.  Activity Description: blaze pod taps ant on step trainer  Activity Setting:  random Number of Pods:  3 Cycles/Sets:  2 Duration (Time or Hit Count):  2 min   TA- To improve functional movements patterns for everyday tasks   Step up and over step trainer x 10 without UE assist   Sidestepping intermittently L and R on PT command   Unless otherwise stated, CGA was provided and gait belt donned in order to ensure pt safety    PATIENT EDUCATION: Education details: PT POC, findings on assessment today, recommendation to continue using RW for all functional mobility Person educated: Patient and Caregiver Haley Jones Education method: Explanation Education comprehension: verbalized understanding and needs further education  HOME EXERCISE PROGRAM: Access Code: [email or web address] URL: [email or web address] Date: 02/04/2024 Prepared by: Peggye Linear  Exercises - Standing March with Counter Support  - 2 x daily - 7 x weekly - 2 sets - 20 reps - Side stepping with counter support: yellow band loop  - 2 x daily - 7 x weekly - 2 sets - 20 reps - Sit to Stand with Counter Support  - 2 x daily - 7 x weekly - 2 sets - 12 reps   GOALS: Goals reviewed with patient? Yes  SHORT TERM GOALS: Target date: 03/10/2024  Pt will be independent with HEP in order to improve strength and balance in order to decrease fall risk and improve function at home and work.  Baseline: need to initiate7/16: 2-3x week Goal status:  MET  LONG TERM GOALS: Target date: 06/19/2024  1.  Patient will complete five times sit to stand test in < 15 seconds indicating an increased LE strength and improved balance. Baseline:  38.75 seconds, using UE support, 7/16: 20.35 sec, UE support on 1st STS only 8/20:16.86 sec no UE support 9/10:12.16 sec  Goal Status: MET   2.  Patient will increase ABC scale score >50% to demonstrate better functional mobility and better confidence with ADLs.   Baseline: 7.5%, 7/16: 18.125% 8/20: 18.75% 9/10: 16.25% Goal status: ONGOING/ REGRESSED/ ALTERED   3.  Patient will increase Berg Balance score by > 6 points to demonstrate decreased fall risk during functional activities. Baseline: need to assess; 01/30/2024= 34/56, 7/16= 39 8/20:42 Goal status: MET   4. Patient will increase 10 meter walk test to >1.68m/s as to improve gait speed for better community ambulation and to reduce fall risk. Baseline: 0.48 m/s using youth RW, requires CGA for safety, 7/16: 0.69 m/s 8/20:  .76 m/s 9/10:.70 m/s Goal status: ONGOING  5. Patient will increase six minute walk test distance to >1000 for progression to community ambulator and improve gait ability Baseline: need to assess; 01/30/2024=410 feet using RW; 02/13/24: 446ft in 74m30s, 7/16: 627ft using 2WW 8/20: 900 ft with RW  9/29: 835 ft with 4WW  Goal status: ONGOING  6.  Patient will improve  dynamic gait index score by 4 points or greater to indicate improvement in gait with a rolling walker as well as  decreased risk for falls Baseline:9/17:10 Goal status: INITIAL  7.  Pt will report 70% or greater confidence when ambulating around her home and completing her usual daily activities with LRAD.  Baseline: 30-50%  Goal status: INITIAL    ASSESSMENT:  CLINICAL IMPRESSION:    Patient continues to demonstrate improved ability to ambulate outdoors without assistive device.  Patient still has difficulty with coordination of right lower extremity particularly with novel activities such as walking up and down step trainer at this date.  Overall patient making good progress and will continue to benefit from skilled physical therapy to improve her mobility, balance, confidence with activities and overall quality of life.  OBJECTIVE IMPAIRMENTS: Abnormal gait, decreased activity tolerance, decreased balance, decreased endurance, decreased knowledge of use of DME, decreased mobility, difficulty walking, decreased strength, decreased safety awareness, impaired vision/preception, and pain.   ACTIVITY LIMITATIONS: carrying, lifting, bending, standing, squatting, stairs, transfers, bed mobility, bathing, toileting, dressing, reach over head, hygiene/grooming, and locomotion level  PARTICIPATION LIMITATIONS: meal prep, cleaning, laundry, driving, shopping, and community activity  PERSONAL FACTORS: Age, Time since onset of injury/illness/exacerbation, and 3+ comorbidities: Hypertension, B12 deficiency, type 2 diabetes, hyperlipidemia, CKD stage IIIa are also affecting patient's functional outcome.   REHAB POTENTIAL: Good  CLINICAL DECISION MAKING: Evolving/moderate complexity  EVALUATION COMPLEXITY: Moderate  PLAN:  PT FREQUENCY: 1-2x/week  PT DURATION: 8 weeks  PLANNED INTERVENTIONS: [id number]- PT Re-evaluation, 97750- Physical Performance Testing, 97110-Therapeutic exercises, 97530-  Therapeutic activity, [id number]- Neuromuscular re-education, 97535- Self Care, [id number]- Manual therapy, [id number]- Gait training, [phone]- Orthotic Initial, [phone]- Orthotic/Prosthetic subsequent, [phone]- Canalith repositioning, [phone]- Electrical stimulation (manual), Patient/Family education, Balance training, Stair training, Taping, Joint mobilization, Spinal mobilization, Vestibular training, Visual/preceptual remediation/compensation, DME instructions, Cryotherapy, Moist heat, and Biofeedback  PLAN FOR NEXT SESSION:  - continue gait training without AD for balance and righting (include multi plane, multidirectional)  - reaching to floor to pick things up without UE assist for improvement in functional activities.    2:21 PM, 05/21/24 Note: Portions of this document were prepared using Dragon voice recognition software and although reviewed may contain unintentional dictation errors in syntax, grammar, or spelling.  Lonni KATHEE Gainer PT ,DPT Physical Therapist- [place]  [facility]

## 2024-05-26 ENCOUNTER — Ambulatory Visit: Admitting: Occupational Therapy

## 2024-05-26 ENCOUNTER — Ambulatory Visit: Admitting: Physical Therapy

## 2024-05-26 ENCOUNTER — Ambulatory Visit: Admitting: Speech Pathology

## 2024-05-26 DIAGNOSIS — M6281 Muscle weakness (generalized): Secondary | ICD-10-CM

## 2024-05-26 DIAGNOSIS — R2681 Unsteadiness on feet: Secondary | ICD-10-CM | POA: Diagnosis not present

## 2024-05-26 DIAGNOSIS — R269 Unspecified abnormalities of gait and mobility: Secondary | ICD-10-CM

## 2024-05-26 NOTE — Therapy (Signed)
 OUTPATIENT PHYSICAL THERAPY TREATMENT  Patient Name: Haley Jones MRN: [id number] DOB:[birth year], 68 y.o., female Today's Date: 05/26/2024  PCP: Dr. Oneil Pinal REFERRING PROVIDER: Toribio Pitch, PA-C  END OF SESSION:   PT End of Session - 05/26/24 1422     Visit Number 34    Number of Visits 40    Date for Recertification  06/18/24    Authorization Type [employer]    Progress Note Due on Visit 40    PT Start Time 1400    PT Stop Time 1441    PT Time Calculation (min) 41 min    Equipment Utilized During Treatment Gait belt    Activity Tolerance Patient tolerated treatment well    Behavior During Therapy WFL for tasks assessed/performed                       Past Medical History:  Diagnosis Date   Adnexal mass    Anxiety    Chronic back pain    Depression    Diabetes mellitus without complication (HCC)    Endometriosis 03/12/2018   GERD (gastroesophageal reflux disease)    Heart murmur 02/2018   undetected until she was an adult. 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   Weight loss 12/06/2017   Past Surgical History:  Procedure Laterality Date   ABDOMINAL HYSTERECTOMY  2008   ovaries also   CESAREAN SECTION  1994   COLONOSCOPY     COLONOSCOPY WITH PROPOFOL  N/A 02/12/2018   Procedure: COLONOSCOPY WITH PROPOFOL ;  Surgeon: Toledo, Ladell POUR, MD;  Location: [facility];  Service: Gastroenterology;  Laterality: N/A;   ESOPHAGOGASTRODUODENOSCOPY (EGD) WITH PROPOFOL  N/A 02/12/2018   Procedure: ESOPHAGOGASTRODUODENOSCOPY (EGD) WITH PROPOFOL ;  Surgeon: Toledo, Ladell POUR, MD;  Location: [facility];  Service: Gastroenterology;  Laterality: N/A;   EYE SURGERY Left 1973   muscle shortening to straighten cross eyes   LAPAROSCOPY N/A 02/20/2018   Procedure: LAPAROSCOPY DIAGNOSTIC, ( RESECTION OF RIGHT LOWER QUADRANT ABDOMINAL MASS);  Surgeon: Nicholaus Selinda Birmingham,  MD;  Location: [facility] OR[facility];  Service: General;  Laterality: N/A;   ROTATOR CUFF REPAIR Left 2011   Patient Active Problem List   Diagnosis Date Noted   Cognitive communication deficit 04/07/2024   Aphasia 04/07/2024   Urinary incontinence as sequela of cerebrovascular accident (CVA) 02/11/2024   Depression with anxiety 01/04/2024   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   Acute CVA (cerebrovascular accident) (HCC) 12/20/2023   Primary hypertension 12/20/2023   Acute kidney injury superimposed on chronic kidney disease 12/20/2023   DM type 2 with diabetic mixed hyperlipidemia (HCC) 11/17/2022   Major depressive disorder, recurrent, 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   B12 deficiency 02/23/2021   SBO (small bowel obstruction) (HCC) 01/29/2019   Barrett's esophagus without dysplasia 04/15/2018   Weight loss 12/06/2017   Hoarse 12/06/2017   Lumbar disc disease 07/26/2017   Diabetes mellitus type 2, controlled, without complications (HCC) 02/09/2015   Surgical menopause 11/26/2014    ONSET DATE: 12/20/2023  REFERRING DIAG: P36.487 (ICD-10-CM) - Left middle cerebral artery stroke (HCC)   THERAPY DIAG:  Muscle weakness (generalized)  Unsteadiness on feet  Abnormality of gait  Rationale for Evaluation and Treatment: Rehabilitation  SUBJECTIVE:  SUBJECTIVE STATEMENT:   Denies any falls since last session. Feels like she has been doing well. Walks from upstairs to clinic with [phone].   PERTINENT HISTORY:Pt states she is not sure the exact date of when her CVA happened because she kept falling at home trying to care for herself and her daughter, but she finally went to the hospital when her sister insisted on it. Pt states her special needs daughter, Haley Jones, passed away recently while pt was in the hospital at [facility]  before she was transferred to [facility] for stroke rehabilitation.Pt's sister states pt requires supervision/cuing for ADLs  to don clothes correctly, with correct orientation (potential apraxia?) Pt/sister report pt has R side inattention.Pt reports supposedly one of her legs is shorter than the other, believes her R LE might be the shorter one. 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     PAIN:  Are you having pain? No  PRECAUTIONS: Fall RED FLAGS: None  WEIGHT BEARING RESTRICTIONS: No FALLS: Has patient fallen in last 6 months? Yes. Number of falls 6+ before going to hospital and 1 fall since being home from CIR - pt states she was trying to gather her clothes by herself rather than waiting for her sister to get there to help her  LIVING ENVIRONMENT Lives with: lives alone and but pt's sister, [name], comes over daily to assist with ADLs and stays to provide supervision all day - pt states at night she would be able to walk to bathroom using RW if needed (but she wears depends in event of incontinence) Lives in: House/apartment Stairs: she has steps, but she doesn't have to use them (house was wheelchair accessible for her daughter)  Has following equipment at home: Environmental consultant - 2 wheeled, shower chair, Grab bars, Ramped entry, and transport chair  PLOF: Independent, Independent with household mobility without device, Independent with homemaking with ambulation, Independent with gait, Independent with transfers, and she was the primary caregiver for her recently deceased daughter   CLOF: Sister provides supervision daily for pt to ambulate using RW safely and provides assist for ADLs (showering and dressing - progressing towards supervision with these activities); Sister states pt requires cues to turn and step back fully prior to sitting to ensure her safety  PATIENT GOALS: get back to being able to go shopping with improved  community level ambulation without the walker; return to driving   OBJECTIVE:  Note: Objective measures were completed at evaluation unless otherwise noted.  LOWER EXTREMITY MMT:    MMT Right Eval Left Eval  Hip flexion 3+, no pain 3+ with some L hip and back pain with this  Knee flexion 3+ 4  Knee extension 4- 4  Ankle dorsiflexion 3+ 4  Ankle plantarflexion 4- 4  (Blank rows = not tested)  TREATMENT DATE: 05/26/2024 TA- To improve functional movements patterns for everyday tasks   Pt challenged to ambulate outside terrain some with 4WW and some without. Ambulates 2 bouts of 150-200 ft without RW and with routes that had both winding turns and hills. 1 LOB descending on a turn corrected with skilled min A from PT. X 20 min   STS holding 3KG ball and overhead press  x 10 reps with feet on airex pad   NMR: To facilitate reeducation of movement, balance, posture, coordination, and/or proprioception/kinesthetic sense.  Sidestepping on airex x 5 laps UE support and x 5 laps with intermitent UE support ( mostly no UE support)   TA- To improve functional movements patterns for everyday tasks   Step practice x 5 rounds up and down set of 4 steps UE assist ad lib   Step up and over step trainer with riser x 14 without UE assist; cues for LLE use to improve ease as pt had one post LOB on concentric movement on a rep where she led with R hemiplegic side.   Unless otherwise stated, CGA was provided and gait belt donned in order to ensure pt safety    PATIENT EDUCATION: Education details: PT POC, findings on assessment today, recommendation to continue using RW for all functional mobility Person educated: Patient and Caregiver [name] Education method: Explanation Education comprehension: verbalized understanding and needs further education  HOME EXERCISE  PROGRAM: Access Code: [email or web address] URL: [email or web address] Date: 02/04/2024 Prepared by: Peggye Linear  Exercises - Standing March with Counter Support  - 2 x daily - 7 x weekly - 2 sets - 20 reps - Side stepping with counter support: yellow band loop  - 2 x daily - 7 x weekly - 2 sets - 20 reps - Sit to Stand with Counter Support  - 2 x daily - 7 x weekly - 2 sets - 12 reps   GOALS: Goals reviewed with patient? Yes  SHORT TERM GOALS: Target date: 03/10/2024  Pt will be independent with HEP in order to improve strength and balance in order to decrease fall risk and improve function at home and work.  Baseline: need to initiate7/16: 2-3x week Goal status:  MET  LONG TERM GOALS: Target date: 06/19/2024  1.  Patient will complete five times sit to stand test in < 15 seconds indicating an increased LE strength and improved balance. Baseline:  38.75 seconds, using UE support, 7/16: 20.35 sec, UE support on 1st STS only 8/20:16.86 sec no UE support 9/10:12.16 sec  Goal Status: MET   2.  Patient will increase ABC scale score >50% to demonstrate better functional mobility and better confidence with ADLs.   Baseline: 7.5%, 7/16: 18.125% 8/20: 18.75% 9/10: 16.25% Goal status: ONGOING/ REGRESSED/ ALTERED   3.  Patient will increase Berg Balance score by > 6 points to demonstrate decreased fall risk during functional activities. Baseline: need to assess; 01/30/2024= 34/56, 7/16= 39 8/20:42 Goal status: MET   4. Patient will increase 10 meter walk test to >1.72m/s as to improve gait speed for better community ambulation and to reduce fall risk. Baseline: 0.48 m/s using youth RW, requires CGA for safety, 7/16: 0.69 m/s 8/20:  .76 m/s 9/10:.70 m/s Goal status: ONGOING  5. Patient will increase six minute walk test distance to >1000 for progression to community ambulator and improve gait ability Baseline: need to assess; 01/30/2024=410 feet using RW; 02/13/24: 418ft in 37m30s, 7/16:  660ft using 2WW 8/20: 900 ft with RW  9/29:  835 ft with 4WW  Goal status: ONGOING  6.  Patient will improve dynamic gait index score by 4 points or greater to indicate improvement in gait with a rolling walker as well as decreased risk for falls Baseline:9/17:10 Goal status: INITIAL  7.  Pt will report 70% or greater confidence when ambulating around her home and completing her usual daily activities with LRAD.  Baseline: 30-50%  Goal status: INITIAL    ASSESSMENT:  CLINICAL IMPRESSION:    Patient continues to demonstrate improved ability to ambulate outdoors without assistive device.  Patient still has difficulty with coordination of right lower extremity particularly with novel activities such as walking up and down step trainer at this date but improved with practice and cues. Overall patient making good progress and will continue to benefit from skilled physical therapy to improve her mobility, balance, confidence with activities and overall quality of life.  OBJECTIVE IMPAIRMENTS: Abnormal gait, decreased activity tolerance, decreased balance, decreased endurance, decreased knowledge of use of DME, decreased mobility, difficulty walking, decreased strength, decreased safety awareness, impaired vision/preception, and pain.   ACTIVITY LIMITATIONS: carrying, lifting, bending, standing, squatting, stairs, transfers, bed mobility, bathing, toileting, dressing, reach over head, hygiene/grooming, and locomotion level  PARTICIPATION LIMITATIONS: meal prep, cleaning, laundry, driving, shopping, and community activity  PERSONAL FACTORS: Age, Time since onset of injury/illness/exacerbation, and 3+ comorbidities: Hypertension, B12 deficiency, type 2 diabetes, hyperlipidemia, CKD stage IIIa are also affecting patient's functional outcome.   REHAB POTENTIAL: Good  CLINICAL DECISION MAKING: Evolving/moderate complexity  EVALUATION COMPLEXITY: Moderate  PLAN:  PT FREQUENCY: 1-2x/week  PT  DURATION: 8 weeks  PLANNED INTERVENTIONS: 97164- PT Re-evaluation, 97750- Physical Performance Testing, 97110-Therapeutic exercises, 97530- Therapeutic activity, [id number]- Neuromuscular re-education, 97535- Self Care, [id number]- Manual therapy, [id number]- Gait training, [phone]- Orthotic Initial, [phone]- Orthotic/Prosthetic subsequent, [phone]- Canalith repositioning, [phone]- Electrical stimulation (manual), Patient/Family education, Balance training, Stair training, Taping, Joint mobilization, Spinal mobilization, Vestibular training, Visual/preceptual remediation/compensation, DME instructions, Cryotherapy, Moist heat, and Biofeedback  PLAN FOR NEXT SESSION:  - continue gait training without AD for balance and righting (include multi plane, multidirectional)  - reaching to floor to pick things up without UE assist for improvement in functional activities.    2:22 PM, 05/26/24 Note: Portions of this document were prepared using Dragon voice recognition software and although reviewed may contain unintentional dictation errors in syntax, grammar, or spelling.  Lonni KATHEE Gainer PT ,DPT Physical Therapist- [place]  [facility]

## 2024-05-28 ENCOUNTER — Encounter: Admitting: Speech Pathology

## 2024-05-28 ENCOUNTER — Ambulatory Visit: Admitting: Physical Therapy

## 2024-05-28 ENCOUNTER — Ambulatory Visit: Admitting: Occupational Therapy

## 2024-05-28 DIAGNOSIS — R2681 Unsteadiness on feet: Secondary | ICD-10-CM | POA: Diagnosis not present

## 2024-05-28 DIAGNOSIS — H543 Unqualified visual loss, both eyes: Secondary | ICD-10-CM

## 2024-05-28 DIAGNOSIS — R269 Unspecified abnormalities of gait and mobility: Secondary | ICD-10-CM

## 2024-05-28 DIAGNOSIS — M6281 Muscle weakness (generalized): Secondary | ICD-10-CM

## 2024-05-28 NOTE — Therapy (Signed)
 OUTPATIENT PHYSICAL THERAPY TREATMENT  Patient Name: Haley Jones MRN: [id number] DOB:1956-05-19, 68 y.o., female Today's Date: 05/28/2024  PCP: Dr. Oneil Pinal REFERRING PROVIDER: Toribio Pitch, PA-C  END OF SESSION:   PT End of Session - 05/28/24 1430     Visit Number 35    Number of Visits 40    Date for Recertification  06/18/24    Authorization Type [employer]    Progress Note Due on Visit 40    PT Start Time 0202    PT Stop Time 0244    PT Time Calculation (min) 42 min    Equipment Utilized During Treatment Gait belt    Activity Tolerance Patient tolerated treatment well    Behavior During Therapy WFL for tasks assessed/performed                        Past Medical History:  Diagnosis Date   Adnexal mass    Anxiety    Chronic back pain    Depression    Diabetes mellitus without complication (HCC)    Endometriosis 03/12/2018   GERD (gastroesophageal reflux disease)    Heart murmur 02/2018   undetected until she was an adult. 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   Weight loss 12/06/2017   Past Surgical History:  Procedure Laterality Date   ABDOMINAL HYSTERECTOMY  2008   ovaries also   CESAREAN SECTION  1994   COLONOSCOPY     COLONOSCOPY WITH PROPOFOL  N/A 02/12/2018   Procedure: COLONOSCOPY WITH PROPOFOL ;  Surgeon: Toledo, Ladell POUR, MD;  Location: [facility];  Service: Gastroenterology;  Laterality: N/A;   ESOPHAGOGASTRODUODENOSCOPY (EGD) WITH PROPOFOL  N/A 02/12/2018   Procedure: ESOPHAGOGASTRODUODENOSCOPY (EGD) WITH PROPOFOL ;  Surgeon: Toledo, Ladell POUR, MD;  Location: [facility];  Service: Gastroenterology;  Laterality: N/A;   EYE SURGERY Left 1973   muscle shortening to straighten cross eyes   LAPAROSCOPY N/A 02/20/2018   Procedure: LAPAROSCOPY DIAGNOSTIC, ( RESECTION OF RIGHT LOWER QUADRANT ABDOMINAL MASS);  Surgeon: Nicholaus Selinda Birmingham, MD;  Location: [facility];  Service: General;  Laterality: N/A;   ROTATOR CUFF REPAIR Left 2011   Patient Active Problem List   Diagnosis Date Noted   Cognitive communication deficit 04/07/2024   Aphasia 04/07/2024   Urinary incontinence as sequela of cerebrovascular accident (CVA) 02/11/2024   Depression with anxiety 01/04/2024   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   Acute CVA (cerebrovascular accident) (HCC) 12/20/2023   Primary hypertension 12/20/2023   Acute kidney injury superimposed on chronic kidney disease 12/20/2023   DM type 2 with diabetic mixed hyperlipidemia (HCC) 11/17/2022   Major depressive disorder, recurrent, 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   B12 deficiency 02/23/2021   SBO (small bowel obstruction) (HCC) 01/29/2019   Barrett's esophagus without dysplasia 04/15/2018   Weight loss 12/06/2017   Hoarse 12/06/2017   Lumbar disc disease 07/26/2017   Diabetes mellitus type 2, controlled, without complications (HCC) 02/09/2015   Surgical menopause 11/26/2014    ONSET DATE: 12/20/2023  REFERRING DIAG: P36.487 (ICD-10-CM) - Left middle cerebral artery stroke (HCC)   THERAPY DIAG:  Unsteadiness on feet  Abnormality of gait  Muscle weakness (generalized)  Low vision, both eyes  Rationale for Evaluation and Treatment: Rehabilitation  SUBJECTIVE:  SUBJECTIVE STATEMENT:   Denies any falls since last session. Feels like she has been doing well. Walks from upstairs to clinic with [phone]. Has a stew making at [facility] this weekend and is excited to eat the stew.   PERTINENT HISTORY:Pt states she is not sure the exact date of when her CVA happened because she kept falling at home trying to care for herself and her daughter, but she finally went to the hospital when her sister insisted on it. Pt  states her special needs daughter, [name], passed away recently while pt was in the hospital at [facility] before she was transferred to [facility] for stroke rehabilitation.Pt's sister states pt requires supervision/cuing for ADLs  to don clothes correctly, with correct orientation (potential apraxia?) Pt/sister report pt has R side inattention.Pt reports supposedly one of her legs is shorter than the other, believes her R LE might be the shorter one. 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     PAIN:  Are you having pain? No  PRECAUTIONS: Fall RED FLAGS: None  WEIGHT BEARING RESTRICTIONS: No FALLS: Has patient fallen in last 6 months? Yes. Number of falls 6+ before going to hospital and 1 fall since being home from CIR - pt states she was trying to gather her clothes by herself rather than waiting for her sister to get there to help her  LIVING ENVIRONMENT Lives with: lives alone and but pt's sister, [name], comes over daily to assist with ADLs and stays to provide supervision all day - pt states at night she would be able to walk to bathroom using RW if needed (but she wears depends in event of incontinence) Lives in: House/apartment Stairs: she has steps, but she doesn't have to use them (house was wheelchair accessible for her daughter)  Has following equipment at home: Environmental consultant - 2 wheeled, shower chair, Grab bars, Ramped entry, and transport chair  PLOF: Independent, Independent with household mobility without device, Independent with homemaking with ambulation, Independent with gait, Independent with transfers, and she was the primary caregiver for her recently deceased daughter   CLOF: Sister provides supervision daily for pt to ambulate using RW safely and provides assist for ADLs (showering and dressing - progressing towards supervision with these activities); Sister states pt requires cues to turn and step back fully prior to  sitting to ensure her safety  PATIENT GOALS: get back to being able to go shopping with improved community level ambulation without the walker; return to driving   OBJECTIVE:  Note: Objective measures were completed at evaluation unless otherwise noted.  LOWER EXTREMITY MMT:    MMT Right Eval Left Eval  Hip flexion 3+, no pain 3+ with some L hip and back pain with this  Knee flexion 3+ 4  Knee extension 4- 4  Ankle dorsiflexion 3+ 4  Ankle plantarflexion 4- 4  (Blank rows = not tested)  TREATMENT DATE: 05/28/2024 TA- To improve functional movements patterns for everyday tasks   Pt challenged to ambulate outside terrain some with 4WW and some without. Ambulates 2 bouts of 150-400 ft without RW and with routes that had both winding turns, grass for extended periods, and hills. No falls but some minor LOB with fatigue X 29 min   STS holding 3KG ball and overhead press 2 x 10 reps with feet on airex pad   Step practice x 5 rounds up and down set of 4 steps UE assist ad lib    Unless otherwise stated, CGA was provided and gait belt donned in order to ensure pt safety    PATIENT EDUCATION: Education details: PT POC, findings on assessment today, recommendation to continue using RW for all functional mobility Person educated: Patient and Caregiver [name] Education method: Explanation Education comprehension: verbalized understanding and needs further education  HOME EXERCISE PROGRAM: Access Code: [email or web address] URL: [email or web address] Date: 02/04/2024 Prepared by: Peggye Linear  Exercises - Standing March with Counter Support  - 2 x daily - 7 x weekly - 2 sets - 20 reps - Side stepping with counter support: yellow band loop  - 2 x daily - 7 x weekly - 2 sets - 20 reps - Sit to Stand with Counter Support  - 2 x daily - 7 x weekly - 2 sets - 12  reps   GOALS: Goals reviewed with patient? Yes  SHORT TERM GOALS: Target date: 03/10/2024  Pt will be independent with HEP in order to improve strength and balance in order to decrease fall risk and improve function at home and work.  Baseline: need to initiate7/16: 2-3x week Goal status:  MET  LONG TERM GOALS: Target date: 06/19/2024  1.  Patient will complete five times sit to stand test in < 15 seconds indicating an increased LE strength and improved balance. Baseline:  38.75 seconds, using UE support, 7/16: 20.35 sec, UE support on 1st STS only 8/20:16.86 sec no UE support 9/10:12.16 sec  Goal Status: MET   2.  Patient will increase ABC scale score >50% to demonstrate better functional mobility and better confidence with ADLs.   Baseline: 7.5%, 7/16: 18.125% 8/20: 18.75% 9/10: 16.25% Goal status: ONGOING/ REGRESSED/ ALTERED   3.  Patient will increase Berg Balance score by > 6 points to demonstrate decreased fall risk during functional activities. Baseline: need to assess; 01/30/2024= 34/56, 7/16= 39 8/20:42 Goal status: MET   4. Patient will increase 10 meter walk test to >1.49m/s as to improve gait speed for better community ambulation and to reduce fall risk. Baseline: 0.48 m/s using youth RW, requires CGA for safety, 7/16: 0.69 m/s 8/20:  .76 m/s 9/10:.70 m/s Goal status: ONGOING  5. Patient will increase six minute walk test distance to >1000 for progression to community ambulator and improve gait ability Baseline: need to assess; 01/30/2024=410 feet using RW; 02/13/24: 487ft in 55m30s, 7/16: 64ft using 2WW 8/20: 900 ft with RW  9/29: 835 ft with 4WW  Goal status: ONGOING  6.  Patient will improve dynamic gait index score by 4 points or greater to indicate improvement in gait with a rolling walker as well as decreased risk for falls Baseline:9/17:10 Goal status: INITIAL  7.  Pt will report 70% or greater confidence when ambulating around her home and completing her usual daily  activities with LRAD.  Baseline: 30-50%  Goal status: INITIAL    ASSESSMENT:  CLINICAL IMPRESSION:    Patient continues to demonstrate improved  ability to ambulate outdoors without assistive device. Pt able to ambulate increased distances without significant LOB this date. Pt independent ambulation showing great progress at this time. Will continue to progress functional activities and higher intensity gait as pt progresses. Overall patient making good progress and will continue to benefit from skilled physical therapy to improve her mobility, balance, confidence with activities and overall quality of life.  OBJECTIVE IMPAIRMENTS: Abnormal gait, decreased activity tolerance, decreased balance, decreased endurance, decreased knowledge of use of DME, decreased mobility, difficulty walking, decreased strength, decreased safety awareness, impaired vision/preception, and pain.   ACTIVITY LIMITATIONS: carrying, lifting, bending, standing, squatting, stairs, transfers, bed mobility, bathing, toileting, dressing, reach over head, hygiene/grooming, and locomotion level  PARTICIPATION LIMITATIONS: meal prep, cleaning, laundry, driving, shopping, and community activity  PERSONAL FACTORS: Age, Time since onset of injury/illness/exacerbation, and 3+ comorbidities: Hypertension, B12 deficiency, type 2 diabetes, hyperlipidemia, CKD stage IIIa are also affecting patient's functional outcome.   REHAB POTENTIAL: Good  CLINICAL DECISION MAKING: Evolving/moderate complexity  EVALUATION COMPLEXITY: Moderate  PLAN:  PT FREQUENCY: 1-2x/week  PT DURATION: 8 weeks  PLANNED INTERVENTIONS: [id number]- PT Re-evaluation, 97750- Physical Performance Testing, 97110-Therapeutic exercises, 97530- Therapeutic activity, [id number]- Neuromuscular re-education, 97535- Self Care, [id number]- Manual therapy, [id number]- Gait training, [phone]- Orthotic Initial, [phone]- Orthotic/Prosthetic subsequent, [phone]- Canalith repositioning, [phone]-  Electrical stimulation (manual), Patient/Family education, Balance training, Stair training, Taping, Joint mobilization, Spinal mobilization, Vestibular training, Visual/preceptual remediation/compensation, DME instructions, Cryotherapy, Moist heat, and Biofeedback  PLAN FOR NEXT SESSION:  - continue gait training without AD for balance and righting (include multi plane, multidirectional)  - reaching to floor to pick things up without UE assist for improvement in functional activities.    2:30 PM, 05/28/24 Note: Portions of this document were prepared using Dragon voice recognition software and although reviewed may contain unintentional dictation errors in syntax, grammar, or spelling.  Lonni KATHEE Gainer PT ,DPT Physical Therapist- [place]  [facility]

## 2024-05-28 NOTE — Therapy (Signed)
 Occupational Therapy Neuro Treatment Note   Patient Name: Haley Jones MRN: [id number] DOB:Jan 27, 1956, 68 y.o., female Today's Date: 05/28/2024  PCP: Cleotilde Oneil FALCON, MD REFERRING PROVIDER: Pegge Toribio PARAS, PA-C   OT End of Session - 05/28/24 1629     Visit Number 28    Number of Visits 48    Date for Recertification  06/25/24    OT Start Time 1445    OT Stop Time 1530    OT Time Calculation (min) 45 min    Activity Tolerance Patient tolerated treatment well    Behavior During Therapy [facility] for tasks assessed/performed                  Past Medical History:  Diagnosis Date   Adnexal mass    Anxiety    Chronic back pain    Depression    Diabetes mellitus without complication (HCC)    Endometriosis 03/12/2018   GERD (gastroesophageal reflux disease)    Heart murmur 02/2018   undetected until she was an adult. 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   Weight loss 12/06/2017   Past Surgical History:  Procedure Laterality Date   ABDOMINAL HYSTERECTOMY  2008   ovaries also   CESAREAN SECTION  1994   COLONOSCOPY     COLONOSCOPY WITH PROPOFOL  N/A 02/12/2018   Procedure: COLONOSCOPY WITH PROPOFOL ;  Surgeon: Toledo, Ladell POUR, MD;  Location: [facility] ENDOSCOPY;  Service: Gastroenterology;  Laterality: N/A;   ESOPHAGOGASTRODUODENOSCOPY (EGD) WITH PROPOFOL  N/A 02/12/2018   Procedure: ESOPHAGOGASTRODUODENOSCOPY (EGD) WITH PROPOFOL ;  Surgeon: Toledo, Ladell POUR, MD;  Location: [facility] ENDOSCOPY;  Service: Gastroenterology;  Laterality: N/A;   EYE SURGERY Left 1973   muscle shortening to straighten cross eyes   LAPAROSCOPY N/A 02/20/2018   Procedure: LAPAROSCOPY DIAGNOSTIC, ( RESECTION OF RIGHT LOWER QUADRANT ABDOMINAL MASS);  Surgeon: Nicholaus Selinda Birmingham, MD;  Location: [facility] ORS;  Service: General;  Laterality: N/A;   ROTATOR CUFF REPAIR Left 2011   Patient Active Problem List    Diagnosis Date Noted   Depression with anxiety 01/04/2024   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   Acute CVA (cerebrovascular accident) (HCC) 12/20/2023   Primary hypertension 12/20/2023   Acute kidney injury superimposed on chronic kidney disease (HCC) 12/20/2023   DM type 2 with diabetic mixed hyperlipidemia (HCC) 11/17/2022   Major depressive disorder, recurrent, 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  Rationale for Evaluation and Treatment: Rehabilitation  SUBJECTIVE:   SUBJECTIVE STATEMENT:  Pt. reports  that she has made a lot of progress. Pt accompanied by: self  PERTINENT HISTORY: Pt. Is a 68 yo female with hx of a L MCA CVA. Pt. Was admitted into ED on 12/20/2023 with an onset of RUE weakness, numbness dysmetria, and visual deficits. Upon assessment, it was concluded that Pt. Experienced multiple infarcts in the L Parietal/Occipital region. PMHx: Dizziness, Falls  PRECAUTIONS: None  WEIGHT BEARING RESTRICTIONS: No  PAIN:  Are you having pain?  no  05/14/24: No pain 05/07/24:  No pain 04/23/24: No pain 04/16/24: No pain 04/02/24: No pain 03/12/2024: 6/10 pain at the R dorsal forearm during gross grip strengthening task. 03/10/2024: No pain today. 03/05/2024: No pain, 2-3/10 level of discomfort during use of green resisitve clips.    FALLS: Has patient fallen in last 6 months? Yes. Number of falls 10  LIVING ENVIRONMENT: Lives with: lives with their family and lives alone Lives in: House/apartment Stairs: No, Ramp Has following equipment at home: Walker - 2 wheeled,  Shower bench, and bed side commode  PLOF: Independent  PATIENT GOALS: To be able to feed herself and hold a fork and knife better.   OBJECTIVE:  Note: Objective measures were completed at Evaluation unless otherwise noted.  HAND DOMINANCE: Left  ADLs: Overall ADLs:  Transfers/ambulation related to ADLs:  Eating: Independent  Grooming: independent UB Dressing: Pt. Has difficulty managing buttons LB Dressing: jean zippers are difficult to manipulate, hiking pants, and clothing negotiation are difficult. Toileting: Independent Bathing: Independent Tub Shower transfers: stepping into shower is difficult.  Equipment: Grab bars, shower bench, bedside commode  IADLs: Shopping: Does not currently do that/has not tried Light housekeeping: Does not currently do that/has not tried Meal Prep: Has not cooked in months, did not prior to CVA, sister provides meals. Community mobility: Relies on family, and friends Medication management: Assistance from sister to initiate medication management, she gives herself a shot for diabetes Financial management: Sister is assisting with monthly bill management Handwriting: Cursive form is 10% legible; Printed form is 90% legible  MOBILITY STATUS: Needs Assist: CGA and Hx of falls  POSTURE COMMENTS:  No Significant postural limitations Sitting balance: Good  ACTIVITY TOLERANCE: Activity tolerance: Good  FUNCTIONAL OUTCOME MEASURES:   UPPER EXTREMITY ROM:    Active ROM Right eval Right 03/03/2024 Right  04/02/24 Left Eval [facility]  Shoulder flexion 50(58) 61(74) 62(76)   Shoulder abduction 43(48) 45(58) 52(58)   Shoulder adduction      Shoulder extension      Shoulder internal rotation      Shoulder external rotation      Elbow flexion 118(131) 125(128) 142(144)   Elbow extension -24(-22) -5(0) 0(0)   Wrist flexion 20 (21) 42(48) 64(72)   Wrist extension 30(31) 30(34) 52(58)   Wrist ulnar deviation      Wrist radial deviation      Wrist  pronation      Wrist supination      (Blank rows = not tested)  UPPER EXTREMITY MMT:     MMT Right eval Right 03/03/2024 Right 04/02/24 Left eval  Shoulder flexion 2/5 2/5 2/5 WFL  Shoulder abduction 2/5 2/5 2/5 [facility]  Shoulder adduction      Shoulder extension      Shoulder internal rotation      Shoulder external rotation      Middle trapezius      Lower trapezius      Elbow flexion 4-/5 4/5 4/5 WFL  Elbow extension 4-/5 4/5 4/5 WFL  Wrist flexion 2+/5 4-/5 4-/5 WFL  Wrist extension 3-/5 4-/5 4-/5 WFL  Wrist ulnar deviation      Wrist radial deviation      Wrist pronation      Wrist supination      (Blank rows = not tested)  HAND FUNCTION: Grip strength: Right: 21 lbs; Left: 14 lbs, Lateral pinch: Right: 7 lbs, Left: 4 lbs, and 3 point pinch: Right: 4 lbs, Left: 5 lbs  03/03/2024:  Grip strength: Right: 23#, Left: 28#,  Lateral pinch: Right: 6#, Left: 5#, 3 point pinch: Right: 6#, Left: 3#  04/02/24:  Grip strength: Right: 31#, Left: 35#, Lateral pinch: Right: 8#, Left: 7#, 3 point pinch: Right: 8#, Left: 6#   COORDINATION: 9 Hole Peg test: Right: 72 sec; Left: 40 sec  03/03/2024: 9 hole peg test: Right: 1 min 3 sec, Left: 36 sec  04/02/24  9 hole peg test: Right: 53 sec, Left: 30 sec    SENSATION: Light touch: Impaired  Proprioception: Impaired   EDEMA: none  MUSCLE TONE: RUE: Mild  COGNITION: Overall cognitive status: Impaired  VISION: Subjective report: Pt. has not noticed the changes in vision but her sister has. Caregiver reports that Pt. Does not bring much attention to the R side.  Baseline vision:  Visual history:   VISION ASSESSMENT: TBD   PERCEPTION: TBD  PRAXIS: Impaired: Motor planning                                                                                                                           TREATMENT DATE: 05/28/2024   Manual therapy:  -STM to the right scapula for elevation, depression, abduction, and rotation in  conjunction with moist heat modality. -Manual therapy was performed independent of, and in preparation for therapeutic Ex.    There. Ex.:  -Pt. tolerated AROM followed by PROM/AAROM to the end ROM for right shoulder flexion, and abduction, horizontal shoulder abduction, and external rotation following moist heat modality. -1# dumbbell for right forearm supination, and wrist extension. -Pt. worked on [employer], and reciprocal motion using the UBE while seated for 8 min. with minimal resistance. One min. in reverse direction.  Therapeutic Act.:   -Facilitated Lateral, and 3pt. pinch strengthening using yellow, red, green, and blue level resistive clips in combination with the encouragement of wrist extension when placing them onto a horizontal dowel placed at the tabletop surface.   PATIENT EDUCATION: Education details: visual perceptual skills, visual compensatory strategies , RUE weakness, BUE functioning,  Right shoulder ROM Person educated: Patient and Sister Education method: Explanation, Demonstration, Tactile cues, and Verbal cues Education comprehension: verbalized understanding and returned demonstration  HOME EXERCISE PROGRAM: Yellow theraputty HEP   GOALS: Goals reviewed with patient? Yes  SHORT TERM GOALS: Target date: 03/10/2024    Pt. Will be independent utilizing HEPs for hand strengthening exercises.  Baseline: Eval: No current HEP program Goal status: INITIAL   LONG TERM GOALS: Target date: 04/21/2024   Pt. Will improve BUE grip strength by 5# of force to be able to open jars. Baseline: 04/02/24: Grip strength: Right: 31#, Left: 35# 03/03/2024: R grip strength: 23#, L grip strength: 28#. Pt. has difficulty securely holding items in her hand. Eval: R grip strength: 21#, L grip strength: 14# Goal status: 04/02/24 achieved; revised to open jars   2.  Pt. Will improve RUE [facility] skills by 3 sec. to be able to manipulate  small objects. Baseline: 04/02/24: 9 hole  peg test: Right: 53  sec, Left: 30 sec 03/03/2024: 9 hole peg test: R side: 1 min 3 sec, L side: 36 sec. 9 hole peg test; R side: 72 secs, L side: 40 secs Goal status: progressing, revised 8/20 for manipulating small objects.  3.  Pt. Will increase R shoulder flex/shoulder ABD ROM by 10 degrees to perform ADLs/IADLs.  Baseline: 04/02/24: Right shoulder flexion: 62(76), Abduction: 52(58) 03/03/2024: R shoulder flex: 61(74), R shoulder ABD: 45(58) R shoulder flex: 50(58), R shoulder ABD: 43(48) Goal status: progressing, ongoing  4.  Pt. Will improve handwriting to 50% legible in cursive form, legibility to be able to send written correspondence Baseline: 03/03/2024: 50% legible in cursive form, Eval: 10% legible in cursive form, 90% legible in printed form. Goal status: Discontinue goal per Pt. report that it has returned to baseline.  5.  Pt. Will improve BUE pinch strength by 2# of force to  be able to open medication bottles Baseline: 04/02/24: Lateral pinch: Right: 8#, Left: 7#, 3 point pinch: Right: 8#, Left: 6# 03/03/2024: Lateral Pinch: R: 6#, L: 5#, 3 pt pinch: R: 6#, L: 3#.  Eval: Lateral Pinch: 7, R 3 pt. Pinch: 4, L Lateral Pinch: 4, L 3 pt. Pinch: 5 Goal status:  04/02/24: Achieved; revised to be able to open medication bottles  6.  Pt. Will increase R wrist extension/flexion by 5 degrees in anticipation of reaching hold of items for ADLs. Baseline: 04/02/24: 52(58) 03/03/2024: R wrist ext: 30 (34), R Wrist flex: 42(48) Eval: R Wrist ext: 20(21), R Wrist flex: 30(31) Goal status: Achieved  ASSESSMENT:  CLINICAL IMPRESSION:  Pt. was able to tolerate the UBE for 8 min. Pt. tolerated the RUE exercises, and the hand strengthening exercises well today. Pt. requires hand-over-hand assist for movement patterns, and technique with the hand exercises, pinch strengthening, and EZ-board. Pt. continues to benefit from OT services to work on RUE ROM, RUE strengthening, bilateral grip strength, bilateral  pinch strength, bilateral [facility] skills, and right sided awareness and attention in order to improve functional independence of ADL/IADL tasks at home.   PERFORMANCE DEFICITS: in functional skills including ADLs, IADLs, coordination, dexterity, proprioception, sensation, tone, ROM, strength, pain, Fine motor control, Gross motor control, endurance, and UE functional use, and psychosocial skills including environmental adaptation, habits, interpersonal interactions, and routines and behaviors.   IMPAIRMENTS: are limiting patient from ADLs, IADLs, rest and sleep, work, leisure, and social participation.   CO-MORBIDITIES: may have co-morbidities  that affects occupational performance. Patient will benefit from skilled OT to address above impairments and improve overall function.  MODIFICATION OR ASSISTANCE TO COMPLETE EVALUATION: Min-Moderate modification of tasks or assist with assess necessary to complete an evaluation.  OT OCCUPATIONAL PROFILE AND HISTORY: Detailed assessment: Review of records and additional review of physical, cognitive, psychosocial history related to current functional performance.  CLINICAL DECISION MAKING: Moderate - several treatment options, min-mod task modification necessary  REHAB POTENTIAL: Good  EVALUATION COMPLEXITY: Moderate    PLAN:  OT FREQUENCY: 1x/week  OT DURATION: 12 weeks  PLANNED INTERVENTIONS: 97535 self care/ADL training, 02889 therapeutic exercise, 97530 therapeutic activity, 97112 neuromuscular re-education, 97018 paraffin, 02989 moist heat, 97010 cryotherapy, 97034 contrast bath, 97032 electrical stimulation (manual), passive range of motion, visual/perceptual remediation/compensation, energy conservation, patient/family education, and DME and/or AE instructions  RECOMMENDED OTHER SERVICES: ST & PT  CONSULTED AND AGREED WITH PLAN OF CARE: Patient and family member/caregiver  PLAN FOR NEXT SESSION: treatment   Richardson Otter, MS, OTR/L    05/28/2024, 4:36 PM

## 2024-06-02 ENCOUNTER — Encounter: Admitting: Speech Pathology

## 2024-06-02 ENCOUNTER — Ambulatory Visit: Admitting: Physical Therapy

## 2024-06-02 ENCOUNTER — Encounter: Admitting: Occupational Therapy

## 2024-06-02 DIAGNOSIS — R2681 Unsteadiness on feet: Secondary | ICD-10-CM | POA: Diagnosis not present

## 2024-06-02 DIAGNOSIS — M6281 Muscle weakness (generalized): Secondary | ICD-10-CM

## 2024-06-02 DIAGNOSIS — R269 Unspecified abnormalities of gait and mobility: Secondary | ICD-10-CM

## 2024-06-02 DIAGNOSIS — H543 Unqualified visual loss, both eyes: Secondary | ICD-10-CM

## 2024-06-02 NOTE — Therapy (Signed)
 OUTPATIENT PHYSICAL THERAPY TREATMENT  Patient Name: Haley Jones MRN: [id number] DOB:[birth year], 68 y.o., female Today's Date: 06/02/2024  PCP: Dr. Oneil Pinal REFERRING PROVIDER: Toribio Pitch, PA-C  END OF SESSION:   PT End of Session - 06/02/24 1424     Visit Number 36    Number of Visits 40    Date for Recertification  06/18/24    Authorization Type [employer]    Progress Note Due on Visit 40    PT Start Time 1400    PT Stop Time 1440    PT Time Calculation (min) 40 min    Equipment Utilized During Treatment Gait belt    Activity Tolerance Patient tolerated treatment well    Behavior During Therapy WFL for tasks assessed/performed                         Past Medical History:  Diagnosis Date   Adnexal mass    Anxiety    Chronic back pain    Depression    Diabetes mellitus without complication (HCC)    Endometriosis 03/12/2018   GERD (gastroesophageal reflux disease)    Heart murmur 02/2018   undetected until she was an adult. 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   Weight loss 12/06/2017   Past Surgical History:  Procedure Laterality Date   ABDOMINAL HYSTERECTOMY  2008   ovaries also   CESAREAN SECTION  1994   COLONOSCOPY     COLONOSCOPY WITH PROPOFOL  N/A 02/12/2018   Procedure: COLONOSCOPY WITH PROPOFOL ;  Surgeon: Toledo, Ladell POUR, MD;  Location: [facility];  Service: Gastroenterology;  Laterality: N/A;   ESOPHAGOGASTRODUODENOSCOPY (EGD) WITH PROPOFOL  N/A 02/12/2018   Procedure: ESOPHAGOGASTRODUODENOSCOPY (EGD) WITH PROPOFOL ;  Surgeon: Toledo, Ladell POUR, MD;  Location: [facility];  Service: Gastroenterology;  Laterality: N/A;   EYE SURGERY Left 1973   muscle shortening to straighten cross eyes   LAPAROSCOPY N/A 02/20/2018   Procedure: LAPAROSCOPY DIAGNOSTIC, ( RESECTION OF RIGHT LOWER QUADRANT ABDOMINAL MASS);  Surgeon: Nicholaus Selinda Birmingham, MD;  Location: [facility];  Service: General;  Laterality: N/A;   ROTATOR CUFF REPAIR Left 2011   Patient Active Problem List   Diagnosis Date Noted   Cognitive communication deficit 04/07/2024   Aphasia 04/07/2024   Urinary incontinence as sequela of cerebrovascular accident (CVA) 02/11/2024   Depression with anxiety 01/04/2024   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   Acute CVA (cerebrovascular accident) (HCC) 12/20/2023   Primary hypertension 12/20/2023   Acute kidney injury superimposed on chronic kidney disease 12/20/2023   DM type 2 with diabetic mixed hyperlipidemia (HCC) 11/17/2022   Major depressive disorder, recurrent, 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   B12 deficiency 02/23/2021   SBO (small bowel obstruction) (HCC) 01/29/2019   Barrett's esophagus without dysplasia 04/15/2018   Weight loss 12/06/2017   Hoarse 12/06/2017   Lumbar disc disease 07/26/2017   Diabetes mellitus type 2, controlled, without complications (HCC) 02/09/2015   Surgical menopause 11/26/2014    ONSET DATE: 12/20/2023  REFERRING DIAG: P36.487 (ICD-10-CM) - Left middle cerebral artery stroke (HCC)   THERAPY DIAG:  Unsteadiness on feet  Abnormality of gait  Low vision, both eyes  Muscle weakness (generalized)  Rationale for Evaluation and Treatment: Rehabilitation  SUBJECTIVE:  SUBJECTIVE STATEMENT:   Denies any falls since last session. Feels like she has been doing well. Walks from upstairs to clinic with [phone]. Came from stroke support group and felt she learned a lot and got a lot out of it.   PERTINENT HISTORY:Pt states she is not sure the exact date of when her CVA happened because she kept falling at home trying to care for herself and her daughter, but she finally went to the hospital when her sister insisted  on it. Pt states her special needs daughter, Haley Jones, passed away recently while pt was in the hospital at [facility] before she was transferred to [facility] for stroke rehabilitation.Pt's sister states pt requires supervision/cuing for ADLs  to don clothes correctly, with correct orientation (potential apraxia?) Pt/sister report pt has R side inattention.Pt reports supposedly one of her legs is shorter than the other, believes her R LE might be the shorter one. 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     PAIN:  Are you having pain? No  PRECAUTIONS: Fall RED FLAGS: None  WEIGHT BEARING RESTRICTIONS: No FALLS: Has patient fallen in last 6 months? Yes. Number of falls 6+ before going to hospital and 1 fall since being home from CIR - pt states she was trying to gather her clothes by herself rather than waiting for her sister to get there to help her  LIVING ENVIRONMENT Lives with: lives alone and but pt's sister, Haley Jones, comes over daily to assist with ADLs and stays to provide supervision all day - pt states at night she would be able to walk to bathroom using RW if needed (but she wears depends in event of incontinence) Lives in: House/apartment Stairs: she has steps, but she doesn't have to use them (house was wheelchair accessible for her daughter)  Has following equipment at home: Environmental consultant - 2 wheeled, shower chair, Grab bars, Ramped entry, and transport chair  PLOF: Independent, Independent with household mobility without device, Independent with homemaking with ambulation, Independent with gait, Independent with transfers, and she was the primary caregiver for her recently deceased daughter   CLOF: Sister provides supervision daily for pt to ambulate using RW safely and provides assist for ADLs (showering and dressing - progressing towards supervision with these activities); Sister states pt requires cues to turn and step back fully  prior to sitting to ensure her safety  PATIENT GOALS: get back to being able to go shopping with improved community level ambulation without the walker; return to driving   OBJECTIVE:  Note: Objective measures were completed at evaluation unless otherwise noted.  LOWER EXTREMITY MMT:    MMT Right Eval Left Eval  Hip flexion 3+, no pain 3+ with some L hip and back pain with this  Knee flexion 3+ 4  Knee extension 4- 4  Ankle dorsiflexion 3+ 4  Ankle plantarflexion 4- 4  (Blank rows = not tested)  TREATMENT DATE: 06/02/2024 TA- To improve functional movements patterns for everyday tasks   Pt challenged to ambulate outside terrain some with 4WW and some without. Ambulates 3 bouts of 200-500 ft without RW and with routes that had both winding turns, grass for extended periods, and hills.25 min   Stair practice in stairwell x 5 reps ascending and descending 6 steps; cues for step over pattern ascending but pt too unfamiliar and only uses this pattern intermittently   STS holding 3KG ball and overhead press 2 x 10 reps with feet on airex pad   Variable step practice no UE 2  sets of 6 reps on and off of 2 separate height steps.   Unless otherwise stated, CGA was provided and gait belt donned in order to ensure pt safety  Pt very fatigued at end of session. BP post session taken and result was   BP: 135/95  mmHg with HR 125    PATIENT EDUCATION: Education details: PT POC, findings on assessment today, recommendation to continue using RW for all functional mobility Person educated: Patient and Caregiver Haley Jones Education method: Explanation Education comprehension: verbalized understanding and needs further education  HOME EXERCISE PROGRAM: Access Code: [email or web address] URL: [email or web address] Date: 02/04/2024 Prepared by: Peggye Linear  Exercises - Standing March with Counter Support  - 2 x daily - 7 x weekly - 2 sets - 20 reps - Side stepping with counter support: yellow band loop  - 2 x daily - 7 x weekly - 2 sets - 20 reps - Sit to Stand with Counter Support  - 2 x daily - 7 x weekly - 2 sets - 12 reps   GOALS: Goals reviewed with patient? Yes  SHORT TERM GOALS: Target date: 03/10/2024  Pt will be independent with HEP in order to improve strength and balance in order to decrease fall risk and improve function at home and work.  Baseline: need to initiate7/16: 2-3x week Goal status:  MET  LONG TERM GOALS: Target date: 06/19/2024  1.  Patient will complete five times sit to stand test in < 15 seconds indicating an increased LE strength and improved balance. Baseline:  38.75 seconds, using UE support, 7/16: 20.35 sec, UE support on 1st STS only 8/20:16.86 sec no UE support 9/10:12.16 sec  Goal Status: MET   2.  Patient will increase ABC scale score >50% to demonstrate better functional mobility and better confidence with ADLs.   Baseline: 7.5%, 7/16: 18.125% 8/20: 18.75% 9/10: 16.25% Goal status: ONGOING/ REGRESSED/ ALTERED   3.  Patient will increase Berg Balance score by > 6 points to demonstrate decreased fall risk during functional activities. Baseline: need to assess; 01/30/2024= 34/56, 7/16= 39 8/20:42 Goal status: MET   4. Patient will increase 10 meter walk test to >1.47m/s as to improve gait speed for better community ambulation and to reduce fall risk. Baseline: 0.48 m/s using youth RW, requires CGA for safety, 7/16: 0.69 m/s 8/20:  .76 m/s 9/10:.70 m/s Goal status: ONGOING  5. Patient will increase six minute walk test distance to >1000 for progression to community ambulator and improve gait ability Baseline: need to assess; 01/30/2024=410 feet using RW; 02/13/24: 450ft in 40m30s, 7/16: 611ft using 2WW 8/20: 900 ft with RW  9/29: 835 ft with 4WW  Goal status: ONGOING  6.  Patient will improve  dynamic gait index score by 4 points or greater to indicate improvement in gait with a rolling walker as well as decreased risk for falls Baseline:9/17:10 Goal status:  INITIAL  7.  Pt will report 70% or greater confidence when ambulating around her home and completing her usual daily activities with LRAD.  Baseline: 30-50%  Goal status: INITIAL    ASSESSMENT:  CLINICAL IMPRESSION:    Patient continues to demonstrate improved ability to ambulate outdoors without assistive device. Pt able to ambulate increased distances without significant LOB this date. Pt independent ambulation showing great progress at this time. Pt fatigued at end of session likely due to increased challenge of activities and less frequent and decreased tie of rest breaks. Will consider additional pacing in future sessions. Will continue to progress functional activities and higher intensity gait as pt progresses. Overall patient making good progress and will continue to benefit from skilled physical therapy to improve her mobility, balance, confidence with activities and overall quality of life.  OBJECTIVE IMPAIRMENTS: Abnormal gait, decreased activity tolerance, decreased balance, decreased endurance, decreased knowledge of use of DME, decreased mobility, difficulty walking, decreased strength, decreased safety awareness, impaired vision/preception, and pain.   ACTIVITY LIMITATIONS: carrying, lifting, bending, standing, squatting, stairs, transfers, bed mobility, bathing, toileting, dressing, reach over head, hygiene/grooming, and locomotion level  PARTICIPATION LIMITATIONS: meal prep, cleaning, laundry, driving, shopping, and community activity  PERSONAL FACTORS: Age, Time since onset of injury/illness/exacerbation, and 3+ comorbidities: Hypertension, B12 deficiency, type 2 diabetes, hyperlipidemia, CKD stage IIIa are also affecting patient's functional outcome.   REHAB POTENTIAL: Good  CLINICAL DECISION MAKING:  Evolving/moderate complexity  EVALUATION COMPLEXITY: Moderate  PLAN:  PT FREQUENCY: 1-2x/week  PT DURATION: 8 weeks  PLANNED INTERVENTIONS: [id number]- PT Re-evaluation, 97750- Physical Performance Testing, 97110-Therapeutic exercises, 97530- Therapeutic activity, [id number]- Neuromuscular re-education, 97535- Self Care, [id number]- Manual therapy, [id number]- Gait training, [phone]- Orthotic Initial, [phone]- Orthotic/Prosthetic subsequent, [phone]- Canalith repositioning, [phone]- Electrical stimulation (manual), Patient/Family education, Balance training, Stair training, Taping, Joint mobilization, Spinal mobilization, Vestibular training, Visual/preceptual remediation/compensation, DME instructions, Cryotherapy, Moist heat, and Biofeedback  PLAN FOR NEXT SESSION:  - continue gait training without AD for balance and righting (include multi plane, multidirectional)  - reaching to floor to pick things up without UE assist for improvement in functional activities.    2:25 PM, 06/02/24 Note: Portions of this document were prepared using Dragon voice recognition software and although reviewed may contain unintentional dictation errors in syntax, grammar, or spelling.  Lonni KATHEE Gainer PT ,DPT Physical Therapist- [place]  [facility]

## 2024-06-04 ENCOUNTER — Encounter: Admitting: Speech Pathology

## 2024-06-04 ENCOUNTER — Ambulatory Visit: Admitting: Occupational Therapy

## 2024-06-04 ENCOUNTER — Ambulatory Visit: Admitting: Physical Therapy

## 2024-06-04 DIAGNOSIS — R278 Other lack of coordination: Secondary | ICD-10-CM

## 2024-06-04 DIAGNOSIS — H543 Unqualified visual loss, both eyes: Secondary | ICD-10-CM

## 2024-06-04 DIAGNOSIS — I69351 Hemiplegia and hemiparesis following cerebral infarction affecting right dominant side: Secondary | ICD-10-CM

## 2024-06-04 DIAGNOSIS — R2681 Unsteadiness on feet: Secondary | ICD-10-CM | POA: Diagnosis not present

## 2024-06-04 DIAGNOSIS — R269 Unspecified abnormalities of gait and mobility: Secondary | ICD-10-CM

## 2024-06-04 DIAGNOSIS — M6281 Muscle weakness (generalized): Secondary | ICD-10-CM

## 2024-06-04 NOTE — Therapy (Signed)
 OUTPATIENT PHYSICAL THERAPY TREATMENT  Patient Name: Haley Jones MRN: [id number] DOB:1956-07-18, 68 y.o., female Today's Date: 06/04/2024  PCP: Dr. Oneil Jones REFERRING PROVIDER: Toribio Pitch, PA-C  END OF SESSION:   PT End of Session - 06/04/24 1450     Visit Number 37    Number of Visits 40    Date for Recertification  06/18/24    Authorization Type [employer]    Progress Note Due on Visit 40    PT Start Time 1450    PT Stop Time 1530    PT Time Calculation (min) 40 min    Equipment Utilized During Treatment Gait belt    Activity Tolerance Patient tolerated treatment well    Behavior During Therapy WFL for tasks assessed/performed                         Past Medical History:  Diagnosis Date   Adnexal mass    Anxiety    Chronic back pain    Depression    Diabetes mellitus without complication (HCC)    Endometriosis 03/12/2018   GERD (gastroesophageal reflux disease)    Heart murmur 02/2018   undetected until she was an adult. 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   Weight loss 12/06/2017   Past Surgical History:  Procedure Laterality Date   ABDOMINAL HYSTERECTOMY  2008   ovaries also   CESAREAN SECTION  1994   COLONOSCOPY     COLONOSCOPY WITH PROPOFOL  N/A 02/12/2018   Procedure: COLONOSCOPY WITH PROPOFOL ;  Surgeon: Haley Jones, Haley Jones POUR, MD;  Location: [facility];  Service: Gastroenterology;  Laterality: N/A;   ESOPHAGOGASTRODUODENOSCOPY (EGD) WITH PROPOFOL  N/A 02/12/2018   Procedure: ESOPHAGOGASTRODUODENOSCOPY (EGD) WITH PROPOFOL ;  Surgeon: Haley Jones, Haley Jones POUR, MD;  Location: [facility];  Service: Gastroenterology;  Laterality: N/A;   EYE SURGERY Left 1973   muscle shortening to straighten cross eyes   LAPAROSCOPY N/A 02/20/2018   Procedure: LAPAROSCOPY DIAGNOSTIC, ( RESECTION OF RIGHT LOWER QUADRANT ABDOMINAL MASS);  Surgeon: Haley Jones Haley Jones [place], MD;  Location: [facility] ORS;  Service: General;  Laterality: N/A;   ROTATOR CUFF REPAIR Left 2011   Patient Active Problem List   Diagnosis Date Noted   Cognitive communication deficit 04/07/2024   Aphasia 04/07/2024   Urinary incontinence as sequela of cerebrovascular accident (CVA) 02/11/2024   Depression with anxiety 01/04/2024   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   Acute CVA (cerebrovascular accident) (HCC) 12/20/2023   Primary hypertension 12/20/2023   Acute kidney injury superimposed on chronic kidney disease 12/20/2023   DM type 2 with diabetic mixed hyperlipidemia (HCC) 11/17/2022   Major depressive disorder, recurrent, 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   B12 deficiency 02/23/2021   SBO (small bowel obstruction) (HCC) 01/29/2019   Barrett's esophagus without dysplasia 04/15/2018   Weight loss 12/06/2017   Hoarse 12/06/2017   Lumbar disc disease 07/26/2017   Diabetes mellitus type 2, controlled, without complications (HCC) 02/09/2015   Surgical menopause 11/26/2014    ONSET DATE: 12/20/2023  REFERRING DIAG: P36.487 (ICD-10-CM) - Left middle cerebral artery stroke (HCC)   THERAPY DIAG:  Muscle weakness (generalized)  Unsteadiness on feet  Abnormality of gait  Low vision, both eyes  Other lack of coordination  Hemiplegia and hemiparesis following cerebral infarction affecting right dominant side (HCC)  Rationale for Evaluation and Treatment: Rehabilitation  SUBJECTIVE:  SUBJECTIVE STATEMENT:   Denies any falls since last session. Feels like she has been doing well. Walks from upstairs to clinic with [phone]. Came from stroke support group and felt she learned a lot and got a lot out of it.   PERTINENT HISTORY:Pt states she is not sure the exact date of when her CVA happened because she  kept falling at home trying to care for herself and her daughter, but she finally went to the hospital when her sister insisted on it. Pt states her special needs daughter, Haley Jones, passed away recently while pt was in the hospital at [facility] before she was transferred to [facility] for stroke rehabilitation.Pt's sister states pt requires supervision/cuing for ADLs  to don clothes correctly, with correct orientation (potential apraxia?) Pt/sister report pt has R side inattention.Pt reports supposedly one of her legs is shorter than the other, believes her R LE might be the shorter one. 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     PAIN:  Are you having pain? No  PRECAUTIONS: Fall RED FLAGS: None  WEIGHT BEARING RESTRICTIONS: No FALLS: Has patient fallen in last 6 months? Yes. Number of falls 6+ before going to hospital and 1 fall since being home from CIR - pt states she was trying to gather her clothes by herself rather than waiting for her sister to get there to help her  LIVING ENVIRONMENT Lives with: lives alone and but pt's sister, Haley Jones, comes over daily to assist with ADLs and stays to provide supervision all day - pt states at night she would be able to walk to bathroom using RW if needed (but she wears depends in event of incontinence) Lives in: House/apartment Stairs: she has steps, but she doesn't have to use them (house was wheelchair accessible for her daughter)  Has following equipment at home: Environmental consultant - 2 wheeled, shower chair, Grab bars, Ramped entry, and transport chair  PLOF: Independent, Independent with household mobility without device, Independent with homemaking with ambulation, Independent with gait, Independent with transfers, and she was the primary caregiver for her recently deceased daughter   CLOF: Sister provides supervision daily for pt to ambulate using RW safely and provides assist for ADLs (showering and  dressing - progressing towards supervision with these activities); Sister states pt requires cues to turn and step back fully prior to sitting to ensure her safety  PATIENT GOALS: get back to being able to go shopping with improved community level ambulation without the walker; return to driving   OBJECTIVE:  Note: Objective measures were completed at evaluation unless otherwise noted.  LOWER EXTREMITY MMT:    MMT Right Eval Left Eval  Hip flexion 3+, no pain 3+ with some L hip and back pain with this  Knee flexion 3+ 4  Knee extension 4- 4  Ankle dorsiflexion 3+ 4  Ankle plantarflexion 4- 4  (Blank rows = not tested)  TREATMENT DATE: 06/04/2024 Gait:   Gait without AD through rehab department 133ft CW circle and 190ft CCW +2x85ft cues for awareness of positioning in hall to allow bystanders to pass   Stair management performed with RUE  with step to pattern then BUE with step through pattern up and down 4 steps x 3  TA- To improve functional movements patterns for everyday tasks    Pt challenged to ambulate outside terrain some with 4WW and some without. Ambulates 3 bouts of 500-600 ft without RW and with routes that had both winding turns, grass for extended periods, and hills.25 min   STS holding 3KG ball and overhead press 2 x 10 reps with feet on airex pad   Unless otherwise stated, CGA was provided and gait belt donned in order to ensure pt safety    PATIENT EDUCATION: Education details: PT POC, findings on assessment today, recommendation to continue using RW for all functional mobility Person educated: Patient and Caregiver Haley Jones Education method: Explanation Education comprehension: verbalized understanding and needs further education  HOME EXERCISE PROGRAM: Access Code: [email or web address] URL: [email or web address] Date:  02/04/2024 Prepared by: Peggye Linear  Exercises - Standing March with Counter Support  - 2 x daily - 7 x weekly - 2 sets - 20 reps - Side stepping with counter support: yellow band loop  - 2 x daily - 7 x weekly - 2 sets - 20 reps - Sit to Stand with Counter Support  - 2 x daily - 7 x weekly - 2 sets - 12 reps   GOALS: Goals reviewed with patient? Yes  SHORT TERM GOALS: Target date: 03/10/2024  Pt will be independent with HEP in order to improve strength and balance in order to decrease fall risk and improve function at home and work.  Baseline: need to initiate7/16: 2-3x week Goal status:  MET  LONG TERM GOALS: Target date: 06/19/2024  1.  Patient will complete five times sit to stand test in < 15 seconds indicating an increased LE strength and improved balance. Baseline:  38.75 seconds, using UE support, 7/16: 20.35 sec, UE support on 1st STS only 8/20:16.86 sec no UE support 9/10:12.16 sec  Goal Status: MET   2.  Patient will increase ABC scale score >50% to demonstrate better functional mobility and better confidence with ADLs.   Baseline: 7.5%, 7/16: 18.125% 8/20: 18.75% 9/10: 16.25% Goal status: ONGOING/ REGRESSED/ ALTERED   3.  Patient will increase Berg Balance score by > 6 points to demonstrate decreased fall risk during functional activities. Baseline: need to assess; 01/30/2024= 34/56, 7/16= 39 8/20:42 Goal status: MET   4. Patient will increase 10 meter walk test to >1.28m/s as to improve gait speed for better community ambulation and to reduce fall risk. Baseline: 0.48 m/s using youth RW, requires CGA for safety, 7/16: 0.69 m/s 8/20:  .76 m/s 9/10:.70 m/s Goal status: ONGOING  5. Patient will increase six minute walk test distance to >1000 for progression to community ambulator and improve gait ability Baseline: need to assess; 01/30/2024=410 feet using RW; 02/13/24: 443ft in 20m30s, 7/16: 668ft using 2WW 8/20: 900 ft with RW  9/29: 835 ft with 4WW  Goal status:  ONGOING  6.  Patient will improve dynamic gait index score by 4 points or greater to indicate improvement in gait with a rolling walker as well as decreased risk for falls Baseline:9/17:10 Goal status: INITIAL  7.  Pt will report 70% or greater confidence when ambulating around her home and completing her usual  daily activities with LRAD.  Baseline: 30-50%  Goal status: INITIAL    ASSESSMENT:  CLINICAL IMPRESSION:    Patient continues to demonstrate improved ability to ambulate outdoors without assistive device. Pt able to ambulate increased distances without significant LOB this date. Pt independent ambulation showing great progress at this time. Pt fatigued at end of session likely due to increased challenge of activities and less frequent and decreased tie of rest breaks. Improved ability to perform step through gait pattern with descent on this day. Overall patient making good progress and will continue to benefit from skilled physical therapy to improve her mobility, balance, confidence with activities and overall quality of life.  OBJECTIVE IMPAIRMENTS: Abnormal gait, decreased activity tolerance, decreased balance, decreased endurance, decreased knowledge of use of DME, decreased mobility, difficulty walking, decreased strength, decreased safety awareness, impaired vision/preception, and pain.   ACTIVITY LIMITATIONS: carrying, lifting, bending, standing, squatting, stairs, transfers, bed mobility, bathing, toileting, dressing, reach over head, hygiene/grooming, and locomotion level  PARTICIPATION LIMITATIONS: meal prep, cleaning, laundry, driving, shopping, and community activity  PERSONAL FACTORS: Age, Time since onset of injury/illness/exacerbation, and 3+ comorbidities: Hypertension, B12 deficiency, type 2 diabetes, hyperlipidemia, CKD stage IIIa are also affecting patient's functional outcome.   REHAB POTENTIAL: Good  CLINICAL DECISION MAKING: Evolving/moderate  complexity  EVALUATION COMPLEXITY: Moderate  PLAN:  PT FREQUENCY: 1-2x/week  PT DURATION: 8 weeks  PLANNED INTERVENTIONS: 97164- PT Re-evaluation, 97750- Physical Performance Testing, 97110-Therapeutic exercises, 97530- Therapeutic activity, [id number]- Neuromuscular re-education, 97535- Self Care, [id number]- Manual therapy, [id number]- Gait training, [phone]- Orthotic Initial, [phone]- Orthotic/Prosthetic subsequent, [phone]- Canalith repositioning, [phone]- Electrical stimulation (manual), Patient/Family education, Balance training, Stair training, Taping, Joint mobilization, Spinal mobilization, Vestibular training, Visual/preceptual remediation/compensation, DME instructions, Cryotherapy, Moist heat, and Biofeedback  PLAN FOR NEXT SESSION:  - continue gait training without AD for balance and righting (include multi plane, multidirectional)  - reaching to floor to pick things up without UE assist for improvement in functional activities.    Massie FORBES Dollar PT ,DPT Physical Therapist- [place]  [facility]

## 2024-06-04 NOTE — Therapy (Signed)
 Occupational Therapy Neuro Treatment Note   Patient Name: Haley Jones MRN: [id number] DOB:Jan 01, 1956, 68 y.o., female Today's Date: 06/04/2024  PCP: Cleotilde Oneil FALCON, MD REFERRING PROVIDER: Pegge Toribio PARAS, PA-C   OT End of Session - 06/04/24 1409     Visit Number 29    Number of Visits 48    Date for Recertification  06/25/24    OT Start Time 1400    OT Stop Time 1445    OT Time Calculation (min) 45 min    Activity Tolerance Patient tolerated treatment well    Behavior During Therapy [facility] for tasks assessed/performed                  Past Medical History:  Diagnosis Date   Adnexal mass    Anxiety    Chronic back pain    Depression    Diabetes mellitus without complication (HCC)    Endometriosis 03/12/2018   GERD (gastroesophageal reflux disease)    Heart murmur 02/2018   undetected until she was an adult. 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   Weight loss 12/06/2017   Past Surgical History:  Procedure Laterality Date   ABDOMINAL HYSTERECTOMY  2008   ovaries also   CESAREAN SECTION  1994   COLONOSCOPY     COLONOSCOPY WITH PROPOFOL  N/A 02/12/2018   Procedure: COLONOSCOPY WITH PROPOFOL ;  Surgeon: Toledo, Ladell POUR, MD;  Location: [facility];  Service: Gastroenterology;  Laterality: N/A;   ESOPHAGOGASTRODUODENOSCOPY (EGD) WITH PROPOFOL  N/A 02/12/2018   Procedure: ESOPHAGOGASTRODUODENOSCOPY (EGD) WITH PROPOFOL ;  Surgeon: Toledo, Ladell POUR, MD;  Location: [facility];  Service: Gastroenterology;  Laterality: N/A;   EYE SURGERY Left 1973   muscle shortening to straighten cross eyes   LAPAROSCOPY N/A 02/20/2018   Procedure: LAPAROSCOPY DIAGNOSTIC, ( RESECTION OF RIGHT LOWER QUADRANT ABDOMINAL MASS);  Surgeon: Nicholaus Selinda Birmingham, MD;  Location: [facility] ORS;  Service: General;  Laterality: N/A;   ROTATOR CUFF REPAIR Left 2011   Patient Active Problem List    Diagnosis Date Noted   Depression with anxiety 01/04/2024   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   Acute CVA (cerebrovascular accident) (HCC) 12/20/2023   Primary hypertension 12/20/2023   Acute kidney injury superimposed on chronic kidney disease (HCC) 12/20/2023   DM type 2 with diabetic mixed hyperlipidemia (HCC) 11/17/2022   Major depressive disorder, recurrent, 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  Rationale for Evaluation and Treatment: Rehabilitation  SUBJECTIVE:   SUBJECTIVE STATEMENT:  Pt. reports  that she has made a lot of progress. Pt accompanied by: self  PERTINENT HISTORY: Pt. Is a 68 yo female with hx of a L MCA CVA. Pt. Was admitted into ED on 12/20/2023 with an onset of RUE weakness, numbness dysmetria, and visual deficits. Upon assessment, it was concluded that Pt. Experienced multiple infarcts in the L Parietal/Occipital region. PMHx: Dizziness, Falls  PRECAUTIONS: None  WEIGHT BEARING RESTRICTIONS: No  PAIN:  Are you having pain?  no  05/14/24: No pain 05/07/24:  No pain 04/23/24: No pain 04/16/24: No pain 04/02/24: No pain 03/12/2024: 6/10 pain at the R dorsal forearm during gross grip strengthening task. 03/10/2024: No pain today. 03/05/2024: No pain, 2-3/10 level of discomfort during use of green resisitve clips.    FALLS: Has patient fallen in last 6 months? Yes. Number of falls 10  LIVING ENVIRONMENT: Lives with: lives with their family and lives alone Lives in: House/apartment Stairs: No, Ramp Has following equipment at home: Walker - 2 wheeled,  Shower bench, and bed side commode  PLOF: Independent  PATIENT GOALS: To be able to feed herself and hold a fork and knife better.   OBJECTIVE:  Note: Objective measures were completed at Evaluation unless otherwise noted.  HAND DOMINANCE: Left  ADLs: Overall ADLs:  Transfers/ambulation related to ADLs:  Eating: Independent  Grooming: independent UB Dressing: Pt. Has difficulty managing buttons LB Dressing: jean zippers are difficult to manipulate, hiking pants, and clothing negotiation are difficult. Toileting: Independent Bathing: Independent Tub Shower transfers: stepping into shower is difficult.  Equipment: Grab bars, shower bench, bedside commode  IADLs: Shopping: Does not currently do that/has not tried Light housekeeping: Does not currently do that/has not tried Meal Prep: Has not cooked in months, did not prior to CVA, sister provides meals. Community mobility: Relies on family, and friends Medication management: Assistance from sister to initiate medication management, she gives herself a shot for diabetes Financial management: Sister is assisting with monthly bill management Handwriting: Cursive form is 10% legible; Printed form is 90% legible  MOBILITY STATUS: Needs Assist: CGA and Hx of falls  POSTURE COMMENTS:  No Significant postural limitations Sitting balance: Good  ACTIVITY TOLERANCE: Activity tolerance: Good  FUNCTIONAL OUTCOME MEASURES:   UPPER EXTREMITY ROM:    Active ROM Right eval Right 03/03/2024 Right  04/02/24 Left Eval [facility]  Shoulder flexion 50(58) 61(74) 62(76)   Shoulder abduction 43(48) 45(58) 52(58)   Shoulder adduction      Shoulder extension      Shoulder internal rotation      Shoulder external rotation      Elbow flexion 118(131) 125(128) 142(144)   Elbow extension -24(-22) -5(0) 0(0)   Wrist flexion 20 (21) 42(48) 64(72)   Wrist extension 30(31) 30(34) 52(58)   Wrist ulnar deviation      Wrist radial deviation      Wrist  pronation      Wrist supination      (Blank rows = not tested)  UPPER EXTREMITY MMT:     MMT Right eval Right 03/03/2024 Right 04/02/24 Left eval  Shoulder flexion 2/5 2/5 2/5 WFL  Shoulder abduction 2/5 2/5 2/5 [facility]  Shoulder adduction      Shoulder extension      Shoulder internal rotation      Shoulder external rotation      Middle trapezius      Lower trapezius      Elbow flexion 4-/5 4/5 4/5 WFL  Elbow extension 4-/5 4/5 4/5 WFL  Wrist flexion 2+/5 4-/5 4-/5 WFL  Wrist extension 3-/5 4-/5 4-/5 WFL  Wrist ulnar deviation      Wrist radial deviation      Wrist pronation      Wrist supination      (Blank rows = not tested)  HAND FUNCTION: Grip strength: Right: 21 lbs; Left: 14 lbs, Lateral pinch: Right: 7 lbs, Left: 4 lbs, and 3 point pinch: Right: 4 lbs, Left: 5 lbs  03/03/2024:  Grip strength: Right: 23#, Left: 28#,  Lateral pinch: Right: 6#, Left: 5#, 3 point pinch: Right: 6#, Left: 3#  04/02/24:  Grip strength: Right: 31#, Left: 35#, Lateral pinch: Right: 8#, Left: 7#, 3 point pinch: Right: 8#, Left: 6#   COORDINATION: 9 Hole Peg test: Right: 72 sec; Left: 40 sec  03/03/2024: 9 hole peg test: Right: 1 min 3 sec, Left: 36 sec  04/02/24  9 hole peg test: Right: 53 sec, Left: 30 sec    SENSATION: Light touch: Impaired  Proprioception: Impaired   EDEMA: none  MUSCLE TONE: RUE: Mild  COGNITION: Overall cognitive status: Impaired  VISION: Subjective report: Pt. has not noticed the changes in vision but her sister has. Caregiver reports that Pt. Does not bring much attention to the R side.  Baseline vision:  Visual history:   VISION ASSESSMENT: TBD   PERCEPTION: TBD  PRAXIS: Impaired: Motor planning                                                                                                                           TREATMENT DATE: 06/04/2024   Manual therapy:  -STM to the right scapula for elevation, depression, abduction, and rotation in  conjunction with moist heat modality. -Manual therapy was performed independent of, and in preparation for therapeutic Ex.    There. Ex.:  -Pt. tolerated AROM followed by PROM/AAROM to the end ROM  through all ranges of the right shoulder. -Pt. tolerated UE strengthening using red theraband for elbow flexion, and extension 1-2 sets 10 reps each. -2# dumbbell for right forearm supination, and wrist extension, and radial deviation.  Therapeutic Act.:   -Facilitated Lateral, and 3pt. pinch strengthening using yellow, red, green, and blue level resistive clips in combination with reaching out to place them onto a horizontal dowel.    PATIENT EDUCATION: Education details: visual perceptual skills, visual compensatory strategies , RUE weakness, BUE functioning,  Right shoulder ROM Person educated: Patient and Sister Education method: Explanation, Demonstration, Tactile cues, and Verbal cues Education comprehension: verbalized understanding and returned demonstration  HOME EXERCISE PROGRAM: Yellow theraputty HEP   GOALS: Goals reviewed with patient? Yes  SHORT TERM GOALS: Target date: 03/10/2024    Pt. Will be independent utilizing HEPs for hand strengthening exercises.  Baseline: Eval: No current HEP program Goal status: INITIAL   LONG TERM GOALS: Target date: 04/21/2024   Pt. Will improve BUE grip strength by 5# of force to be able to open jars. Baseline: 04/02/24: Grip strength: Right: 31#, Left: 35# 03/03/2024: R grip strength: 23#, L grip strength: 28#. Pt. has difficulty securely holding items in her hand. Eval: R grip strength: 21#, L grip strength: 14# Goal status: 04/02/24 achieved; revised to open jars   2.  Pt. Will improve RUE [facility] skills by 3 sec. to be able to manipulate  small objects. Baseline: 04/02/24: 9 hole peg test: Right: 53 sec, Left: 30 sec 03/03/2024: 9 hole peg test: R side: 1 min 3 sec, L side: 36 sec. 9  hole peg test; R side: 72 secs, L side: 40 secs Goal  status: progressing, revised 8/20 for manipulating small objects.  3.  Pt. Will increase R shoulder flex/shoulder ABD ROM by 10 degrees to perform ADLs/IADLs.  Baseline: 04/02/24: Right shoulder flexion: 62(76), Abduction: 52(58) 03/03/2024: R shoulder flex: 61(74), R shoulder ABD: 45(58) R shoulder flex: 50(58), R shoulder ABD: 43(48) Goal status: progressing, ongoing  4.  Pt. Will improve handwriting to 50% legible in cursive form, legibility to be able to send written correspondence Baseline: 03/03/2024: 50% legible in cursive form, Eval: 10% legible in cursive form, 90% legible in printed form. Goal status: Discontinue goal per Pt. report that it has returned to baseline.  5.  Pt. Will improve BUE pinch strength by 2# of force to  be able to open medication bottles Baseline: 04/02/24: Lateral pinch: Right: 8#, Left: 7#, 3 point pinch: Right: 8#, Left: 6# 03/03/2024: Lateral Pinch: R: 6#, L: 5#, 3 pt pinch: R: 6#, L: 3#.  Eval: Lateral Pinch: 7, R 3 pt. Pinch: 4, L Lateral Pinch: 4, L 3 pt. Pinch: 5 Goal status:  04/02/24: Achieved; revised to be able to open medication bottles  6.  Pt. Will increase R wrist extension/flexion by 5 degrees in anticipation of reaching hold of items for ADLs. Baseline: 04/02/24: 52(58) 03/03/2024: R wrist ext: 30 (34), R Wrist flex: 42(48) Eval: R Wrist ext: 20(21), R Wrist flex: 30(31) Goal status: Achieved  ASSESSMENT:  CLINICAL IMPRESSION:  Pt. continues to present with tightness proximally in the right shoulder, and requires assist for all exercises, and reaching tasks. Pt. requires visual cues, and cues for motor planning through each movement, as well as cues for position of the hand in preparation for using during each task. Plan to take measurements, and review goals for progress report next session. Pt. continues to benefit from OT services to work on RUE ROM, RUE strengthening, bilateral grip strength, bilateral pinch strength, bilateral [facility] skills, right  sided awareness and attention in order to improve functional independence of ADL/IADL tasks at home.   PERFORMANCE DEFICITS: in functional skills including ADLs, IADLs, coordination, dexterity, proprioception, sensation, tone, ROM, strength, pain, Fine motor control, Gross motor control, endurance, and UE functional use, and psychosocial skills including environmental adaptation, habits, interpersonal interactions, and routines and behaviors.   IMPAIRMENTS: are limiting patient from ADLs, IADLs, rest and sleep, work, leisure, and social participation.   CO-MORBIDITIES: may have co-morbidities  that affects occupational performance. Patient will benefit from skilled OT to address above impairments and improve overall function.  MODIFICATION OR ASSISTANCE TO COMPLETE EVALUATION: Min-Moderate modification of tasks or assist with assess necessary to complete an evaluation.  OT OCCUPATIONAL PROFILE AND HISTORY: Detailed assessment: Review of records and additional review of physical, cognitive, psychosocial history related to current functional performance.  CLINICAL DECISION MAKING: Moderate - several treatment options, min-mod task modification necessary  REHAB POTENTIAL: Good  EVALUATION COMPLEXITY: Moderate    PLAN:  OT FREQUENCY: 1x/week  OT DURATION: 12 weeks  PLANNED INTERVENTIONS: 97535 self care/ADL training, 02889 therapeutic exercise, 97530 therapeutic activity, 97112 neuromuscular re-education, 97018 paraffin, 02989 moist heat, 97010 cryotherapy, 97034 contrast bath, 97032 electrical stimulation (manual), passive range of motion, visual/perceptual remediation/compensation, energy conservation, patient/family education, and DME and/or AE instructions  RECOMMENDED OTHER SERVICES: ST & PT  CONSULTED AND AGREED WITH PLAN OF CARE: Patient and family member/caregiver  PLAN FOR NEXT SESSION: treatment   Richardson Otter, MS, OTR/L   06/04/2024, 2:14 PM

## 2024-06-09 ENCOUNTER — Encounter: Admitting: Occupational Therapy

## 2024-06-09 ENCOUNTER — Encounter: Admitting: Speech Pathology

## 2024-06-09 ENCOUNTER — Ambulatory Visit: Admitting: Physical Therapy

## 2024-06-09 DIAGNOSIS — M6281 Muscle weakness (generalized): Secondary | ICD-10-CM

## 2024-06-09 DIAGNOSIS — R2681 Unsteadiness on feet: Secondary | ICD-10-CM | POA: Diagnosis not present

## 2024-06-09 DIAGNOSIS — R269 Unspecified abnormalities of gait and mobility: Secondary | ICD-10-CM

## 2024-06-09 DIAGNOSIS — I69351 Hemiplegia and hemiparesis following cerebral infarction affecting right dominant side: Secondary | ICD-10-CM

## 2024-06-09 NOTE — Therapy (Signed)
 OUTPATIENT PHYSICAL THERAPY TREATMENT  Patient Name: Haley Jones MRN: [id number] DOB:29-Apr-1956, 68 y.o., female Today's Date: 06/09/2024  PCP: Dr. Oneil Jones REFERRING PROVIDER: Toribio Pitch, PA-C  END OF SESSION:   PT End of Session - 06/09/24 1402     Visit Number 38    Number of Visits 40    Date for Recertification  06/18/24    Authorization Type [employer]    Progress Note Due on Visit 40    PT Start Time 1403    PT Stop Time 1443    PT Time Calculation (min) 40 min    Equipment Utilized During Treatment Gait belt    Activity Tolerance Patient tolerated treatment well    Behavior During Therapy WFL for tasks assessed/performed                         Past Medical History:  Diagnosis Date   Adnexal mass    Anxiety    Chronic back pain    Depression    Diabetes mellitus without complication (HCC)    Endometriosis 03/12/2018   GERD (gastroesophageal reflux disease)    Heart murmur 02/2018   undetected until she was an adult. 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   Weight loss 12/06/2017   Past Surgical History:  Procedure Laterality Date   ABDOMINAL HYSTERECTOMY  2008   ovaries also   CESAREAN SECTION  1994   COLONOSCOPY     COLONOSCOPY WITH PROPOFOL  N/A 02/12/2018   Procedure: COLONOSCOPY WITH PROPOFOL ;  Surgeon: Haley Jones, Haley POUR, MD;  Location: [facility];  Service: Gastroenterology;  Laterality: N/A;   ESOPHAGOGASTRODUODENOSCOPY (EGD) WITH PROPOFOL  N/A 02/12/2018   Procedure: ESOPHAGOGASTRODUODENOSCOPY (EGD) WITH PROPOFOL ;  Surgeon: Haley Jones, Haley POUR, MD;  Location: [facility];  Service: Gastroenterology;  Laterality: N/A;   EYE SURGERY Left 1973   muscle shortening to straighten cross eyes   LAPAROSCOPY N/A 02/20/2018   Procedure: LAPAROSCOPY DIAGNOSTIC, ( RESECTION OF RIGHT LOWER QUADRANT ABDOMINAL MASS);  Surgeon: Haley Selinda Birmingham, MD;  Location: [facility];  Service: General;  Laterality: N/A;   ROTATOR CUFF REPAIR Left 2011   Patient Active Problem List   Diagnosis Date Noted   Cognitive communication deficit 04/07/2024   Aphasia 04/07/2024   Urinary incontinence as sequela of cerebrovascular accident (CVA) 02/11/2024   Depression with anxiety 01/04/2024   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   Acute CVA (cerebrovascular accident) (HCC) 12/20/2023   Primary hypertension 12/20/2023   Acute kidney injury superimposed on chronic kidney disease 12/20/2023   DM type 2 with diabetic mixed hyperlipidemia (HCC) 11/17/2022   Major depressive disorder, recurrent, 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   B12 deficiency 02/23/2021   SBO (small bowel obstruction) (HCC) 01/29/2019   Barrett's esophagus without dysplasia 04/15/2018   Weight loss 12/06/2017   Hoarse 12/06/2017   Lumbar disc disease 07/26/2017   Diabetes mellitus type 2, controlled, without complications (HCC) 02/09/2015   Surgical menopause 11/26/2014    ONSET DATE: 12/20/2023  REFERRING DIAG: P36.487 (ICD-10-CM) - Left middle cerebral artery stroke (HCC)   THERAPY DIAG:  Muscle weakness (generalized)  Unsteadiness on feet  Abnormality of gait  Hemiplegia and hemiparesis following cerebral infarction affecting right dominant side (HCC)  Rationale for Evaluation and Treatment: Rehabilitation  SUBJECTIVE:  SUBJECTIVE STATEMENT:   Denies any falls since last session. Feels like she has been doing well. Walks from upstairs to clinic with [phone]. Came from stroke support group and felt she learned a lot and got a lot out of it.   PERTINENT HISTORY:Pt states she is not sure the exact date of when her CVA happened because she kept falling at home trying to care for herself and her  daughter, but she finally went to the hospital when her sister insisted on it. Pt states her special needs daughter, Haley Jones, passed away recently while pt was in the hospital at [facility] before she was transferred to [facility] for stroke rehabilitation.Pt's sister states pt requires supervision/cuing for ADLs  to don clothes correctly, with correct orientation (potential apraxia?) Pt/sister report pt has R side inattention.Pt reports supposedly one of her legs is shorter than the other, believes her R LE might be the shorter one. 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     PAIN:  Are you having pain? No  PRECAUTIONS: Fall RED FLAGS: None  WEIGHT BEARING RESTRICTIONS: No FALLS: Has patient fallen in last 6 months? Yes. Number of falls 6+ before going to hospital and 1 fall since being home from CIR - pt states she was trying to gather her clothes by herself rather than waiting for her sister to get there to help her  LIVING ENVIRONMENT Lives with: lives alone and but pt's sister, Haley Jones, comes over daily to assist with ADLs and stays to provide supervision all day - pt states at night she would be able to walk to bathroom using RW if needed (but she wears depends in event of incontinence) Lives in: House/apartment Stairs: she has steps, but she doesn't have to use them (house was wheelchair accessible for her daughter)  Has following equipment at home: [employer] - 2 wheeled, shower chair, Grab bars, Ramped entry, and transport chair  PLOF: Independent, Independent with household mobility without device, Independent with homemaking with ambulation, Independent with gait, Independent with transfers, and she was the primary caregiver for her recently deceased daughter   CLOF: Sister provides supervision daily for pt to ambulate using RW safely and provides assist for ADLs (showering and dressing - progressing towards supervision with these  activities); Sister states pt requires cues to turn and step back fully prior to sitting to ensure her safety  PATIENT GOALS: get back to being able to go shopping with improved community level ambulation without the walker; return to driving   OBJECTIVE:  Note: Objective measures were completed at evaluation unless otherwise noted.  LOWER EXTREMITY MMT:    MMT Right Eval Left Eval  Hip flexion 3+, no pain 3+ with some L hip and back pain with this  Knee flexion 3+ 4  Knee extension 4- 4  Ankle dorsiflexion 3+ 4  Ankle plantarflexion 4- 4  (Blank rows = not tested)  TREATMENT DATE: 06/09/2024  TA- To improve functional movements patterns for everyday tasks   Pt challenged with stair navigation x 2 flights ascending and descending using L UE for support x 54 total steps ea - cues for step over step pattern   Gait with 2.5# AW x 1000 ft no AD navigated back to clinic with min directions from PT to work on retracing steps and spatial awareness. Also had to navigate people in route.   STS holding 3KG ball and overhead press 2 x 10 reps with feet on airex pad   Stepping over step trainer in circle loop x 5 reps   Unless otherwise stated, CGA was provided and gait belt donned in order to ensure pt safety    PATIENT EDUCATION: Education details: PT POC, findings on assessment today, recommendation to continue using RW for all functional mobility Person educated: Patient and Caregiver Haley Jones Education method: Explanation Education comprehension: verbalized understanding and needs further education  HOME EXERCISE PROGRAM: Access Code: [email or web address] URL: [email or web address] Date: 02/04/2024 Prepared by: Peggye Linear  Exercises - Standing March with Counter Support  - 2 x daily - 7 x weekly - 2 sets - 20 reps - Side stepping with counter  support: yellow band loop  - 2 x daily - 7 x weekly - 2 sets - 20 reps - Sit to Stand with Counter Support  - 2 x daily - 7 x weekly - 2 sets - 12 reps   GOALS: Goals reviewed with patient? Yes  SHORT TERM GOALS: Target date: 03/10/2024  Pt will be independent with HEP in order to improve strength and balance in order to decrease fall risk and improve function at home and work.  Baseline: need to initiate7/16: 2-3x week Goal status:  MET  LONG TERM GOALS: Target date: 06/19/2024  1.  Patient will complete five times sit to stand test in < 15 seconds indicating an increased LE strength and improved balance. Baseline:  38.75 seconds, using UE support, 7/16: 20.35 sec, UE support on 1st STS only 8/20:16.86 sec no UE support 9/10:12.16 sec  Goal Status: MET   2.  Patient will increase ABC scale score >50% to demonstrate better functional mobility and better confidence with ADLs.   Baseline: 7.5%, 7/16: 18.125% 8/20: 18.75% 9/10: 16.25% Goal status: ONGOING/ REGRESSED/ ALTERED   3.  Patient will increase Berg Balance score by > 6 points to demonstrate decreased fall risk during functional activities. Baseline: need to assess; 01/30/2024= 34/56, 7/16= 39 8/20:42 Goal status: MET   4. Patient will increase 10 meter walk test to >1.57m/s as to improve gait speed for better community ambulation and to reduce fall risk. Baseline: 0.48 m/s using youth RW, requires CGA for safety, 7/16: 0.69 m/s 8/20:  .76 m/s 9/10:.70 m/s Goal status: ONGOING  5. Patient will increase six minute walk test distance to >1000 for progression to community ambulator and improve gait ability Baseline: need to assess; 01/30/2024=410 feet using RW; 02/13/24: 460ft in 26m30s, 7/16: 675ft using 2WW 8/20: 900 ft with RW  9/29: 835 ft with 4WW  Goal status: ONGOING  6.  Patient will improve dynamic gait index score by 4 points or greater to indicate improvement in gait with a rolling walker as well as decreased risk for  falls Baseline:9/17:10 Goal status: INITIAL  7.  Pt will report 70% or greater confidence when ambulating around her home and completing her usual daily activities with LRAD.  Baseline: 30-50%  Goal status: INITIAL  ASSESSMENT:  CLINICAL IMPRESSION:    Patient arrived with good motivation for completion of pt activities.  Spent ample time on stair and functional gait training this date. Pt progressing well with these activities but requires cues at time for proper performance. Pt reports tremendous benefit from interventions and therapy over her course. Pt will continue to benefit from skilled physical therapy intervention to address impairments, improve QOL, and attain therapy goals.    OBJECTIVE IMPAIRMENTS: Abnormal gait, decreased activity tolerance, decreased balance, decreased endurance, decreased knowledge of use of DME, decreased mobility, difficulty walking, decreased strength, decreased safety awareness, impaired vision/preception, and pain.   ACTIVITY LIMITATIONS: carrying, lifting, bending, standing, squatting, stairs, transfers, bed mobility, bathing, toileting, dressing, reach over head, hygiene/grooming, and locomotion level  PARTICIPATION LIMITATIONS: meal prep, cleaning, laundry, driving, shopping, and community activity  PERSONAL FACTORS: Age, Time since onset of injury/illness/exacerbation, and 3+ comorbidities: Hypertension, B12 deficiency, type 2 diabetes, hyperlipidemia, CKD stage IIIa are also affecting patient's functional outcome.   REHAB POTENTIAL: Good  CLINICAL DECISION MAKING: Evolving/moderate complexity  EVALUATION COMPLEXITY: Moderate  PLAN:  PT FREQUENCY: 1-2x/week  PT DURATION: 8 weeks  PLANNED INTERVENTIONS: [id number]- PT Re-evaluation, 97750- Physical Performance Testing, 97110-Therapeutic exercises, 97530- Therapeutic activity, [id number]- Neuromuscular re-education, 97535- Self Care, [id number]- Manual therapy, [id number]- Gait training, [phone]- Orthotic  Initial, [phone]- Orthotic/Prosthetic subsequent, [phone]- Canalith repositioning, [phone]- Electrical stimulation (manual), Patient/Family education, Balance training, Stair training, Taping, Joint mobilization, Spinal mobilization, Vestibular training, Visual/preceptual remediation/compensation, DME instructions, Cryotherapy, Moist heat, and Biofeedback  PLAN FOR NEXT SESSION:  - continue gait training without AD for balance and righting (include multi plane, multidirectional)  - reaching to floor to pick things up without UE assist for improvement in functional activities.    Lonni KATHEE Gainer PT ,DPT Physical Therapist- [place]  [facility]

## 2024-06-11 ENCOUNTER — Ambulatory Visit: Admitting: Physical Therapy

## 2024-06-11 ENCOUNTER — Encounter: Admitting: Speech Pathology

## 2024-06-11 ENCOUNTER — Encounter: Admitting: Occupational Therapy

## 2024-06-11 ENCOUNTER — Ambulatory Visit: Admitting: Occupational Therapy

## 2024-06-16 ENCOUNTER — Encounter: Admitting: Occupational Therapy

## 2024-06-16 ENCOUNTER — Encounter: Admitting: Speech Pathology

## 2024-06-16 ENCOUNTER — Ambulatory Visit: Attending: Physician Assistant | Admitting: Physical Therapy

## 2024-06-16 DIAGNOSIS — M6281 Muscle weakness (generalized): Secondary | ICD-10-CM | POA: Diagnosis present

## 2024-06-16 DIAGNOSIS — I69351 Hemiplegia and hemiparesis following cerebral infarction affecting right dominant side: Secondary | ICD-10-CM | POA: Insufficient documentation

## 2024-06-16 DIAGNOSIS — R278 Other lack of coordination: Secondary | ICD-10-CM | POA: Diagnosis present

## 2024-06-16 DIAGNOSIS — R269 Unspecified abnormalities of gait and mobility: Secondary | ICD-10-CM | POA: Diagnosis present

## 2024-06-16 DIAGNOSIS — R2681 Unsteadiness on feet: Secondary | ICD-10-CM | POA: Diagnosis present

## 2024-06-16 NOTE — Therapy (Signed)
 OUTPATIENT PHYSICAL THERAPY TREATMENT  Patient Name: Haley Jones MRN: [id number] DOB:[birth year], 68 y.o., female Today's Date: 06/16/2024  PCP: Dr. Oneil Pinal REFERRING PROVIDER: Toribio Pitch, PA-C  END OF SESSION:   PT End of Session - 06/16/24 1534     Visit Number 39    Number of Visits 40    Date for Recertification  06/18/24    Authorization Type [employer]    Progress Note Due on Visit 40    PT Start Time 1400    PT Stop Time 1443    PT Time Calculation (min) 43 min    Equipment Utilized During Treatment Gait belt    Activity Tolerance Patient tolerated treatment well    Behavior During Therapy WFL for tasks assessed/performed           Past Medical History:  Diagnosis Date   Adnexal mass    Anxiety    Chronic back pain    Depression    Diabetes mellitus without complication (HCC)    Endometriosis 03/12/2018   GERD (gastroesophageal reflux disease)    Heart murmur 02/2018   undetected until she was an adult. 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   Weight loss 12/06/2017   Past Surgical History:  Procedure Laterality Date   ABDOMINAL HYSTERECTOMY  2008   ovaries also   CESAREAN SECTION  1994   COLONOSCOPY     COLONOSCOPY WITH PROPOFOL  N/A 02/12/2018   Procedure: COLONOSCOPY WITH PROPOFOL ;  Surgeon: Toledo, Ladell POUR, MD;  Location: [facility];  Service: Gastroenterology;  Laterality: N/A;   ESOPHAGOGASTRODUODENOSCOPY (EGD) WITH PROPOFOL  N/A 02/12/2018   Procedure: ESOPHAGOGASTRODUODENOSCOPY (EGD) WITH PROPOFOL ;  Surgeon: Toledo, Ladell POUR, MD;  Location: [facility];  Service: Gastroenterology;  Laterality: N/A;   EYE SURGERY Left 1973   muscle shortening to straighten cross eyes   LAPAROSCOPY N/A 02/20/2018   Procedure: LAPAROSCOPY DIAGNOSTIC, ( RESECTION OF RIGHT LOWER QUADRANT ABDOMINAL MASS);  Surgeon: Nicholaus Selinda Birmingham, MD;  Location: [facility];   Service: General;  Laterality: N/A;   ROTATOR CUFF REPAIR Left 2011   Patient Active Problem List   Diagnosis Date Noted   Cognitive communication deficit 04/07/2024   Aphasia 04/07/2024   Urinary incontinence as sequela of cerebrovascular accident (CVA) 02/11/2024   Depression with anxiety 01/04/2024   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   Acute CVA (cerebrovascular accident) (HCC) 12/20/2023   Primary hypertension 12/20/2023   Acute kidney injury superimposed on chronic kidney disease 12/20/2023   DM type 2 with diabetic mixed hyperlipidemia (HCC) 11/17/2022   Major depressive disorder, recurrent, 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   B12 deficiency 02/23/2021   SBO (small bowel obstruction) (HCC) 01/29/2019   Barrett's esophagus without dysplasia 04/15/2018   Weight loss 12/06/2017   Hoarse 12/06/2017   Lumbar disc disease 07/26/2017   Diabetes mellitus type 2, controlled, without complications (HCC) 02/09/2015   Surgical menopause 11/26/2014    ONSET DATE: 12/20/2023  REFERRING DIAG: P36.487 (ICD-10-CM) - Left middle cerebral artery stroke (HCC)   THERAPY DIAG:  Unsteadiness on feet  Abnormality of gait  Hemiplegia and hemiparesis following cerebral infarction affecting right dominant side (HCC)  Muscle weakness (generalized)  Rationale for Evaluation and Treatment: Rehabilitation  SUBJECTIVE:  SUBJECTIVE STATEMENT:  Pt reports she has been working on taking steps without 4WW, and was able to reach into her shelf to grab clothes without loss of balance. She had a sleep study completed last night and says she did not sleep well.   PERTINENT HISTORY:Pt states she is not sure the exact date of when her CVA happened because she kept falling at home trying to care for herself and her daughter, but  she finally went to the hospital when her sister insisted on it. Pt states her special needs daughter, Haley Jones, passed away recently while pt was in the hospital at [facility] before she was transferred to [facility] for stroke rehabilitation.Pt's sister states pt requires supervision/cuing for ADLs  to don clothes correctly, with correct orientation (potential apraxia?) Pt/sister report pt has R side inattention.Pt reports supposedly one of her legs is shorter than the other, believes her R LE might be the shorter one. 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     PAIN:  Are you having pain? No  PRECAUTIONS: Fall RED FLAGS: None  WEIGHT BEARING RESTRICTIONS: No FALLS: Has patient fallen in last 6 months? Yes. Number of falls 6+ before going to hospital and 1 fall since being home from CIR - pt states she was trying to gather her clothes by herself rather than waiting for her sister to get there to help her  LIVING ENVIRONMENT Lives with: lives alone and but pt's sister, [name], comes over daily to assist with ADLs and stays to provide supervision all day - pt states at night she would be able to walk to bathroom using RW if needed (but she wears depends in event of incontinence) Lives in: House/apartment Stairs: she has steps, but she doesn't have to use them (house was wheelchair accessible for her daughter)  Has following equipment at home: Environmental Consultant - 2 wheeled, shower chair, Grab bars, Ramped entry, and transport chair  PLOF: Independent, Independent with household mobility without device, Independent with homemaking with ambulation, Independent with gait, Independent with transfers, and she was the primary caregiver for her recently deceased daughter   CLOF: Sister provides supervision daily for pt to ambulate using RW safely and provides assist for ADLs (showering and dressing - progressing towards supervision with these activities);  Sister states pt requires cues to turn and step back fully prior to sitting to ensure her safety  PATIENT GOALS: get back to being able to go shopping with improved community level ambulation without the walker; return to driving   OBJECTIVE:  Note: Objective measures were completed at evaluation unless otherwise noted.  LOWER EXTREMITY MMT:    MMT Right Eval Left Eval  Hip flexion 3+, no pain 3+ with some L hip and back pain with this  Knee flexion 3+ 4  Knee extension 4- 4  Ankle dorsiflexion 3+ 4  Ankle plantarflexion 4- 4  (Blank rows = not tested)  TREATMENT DATE: 06/16/2024  TA- To improve functional movements patterns for everyday tasks    Ambulate across stable and unstable surface outside. Negotiating changing surfaces from sidewalk to gravel, across brick with turns, stairs, ramps, and obstacles in pathway without LOB.  - Pt required 3 rest breaks of ~34min during outdoor ambulation.  - 2# AW without AD.   -total time 30 min  Blaze Pods: Activity Description: Blaze pods were arranged in a circle, with obstacles placed between pods requiring pt to step over and around in order to tap pods.  Activity Setting:  random Number of Pods:  3 Cycles/Sets:  2 Duration (Time or Hit Count):  2 min  Unless otherwise stated, CGA was provided and gait belt donned in order to ensure pt safety    PATIENT EDUCATION: Education details: PT POC, findings on assessment today, recommendation to continue using RW for all functional mobility Person educated: Patient and Caregiver [name] Education method: Explanation Education comprehension: verbalized understanding and needs further education  HOME EXERCISE PROGRAM: Access Code: [email or web address] URL: [email or web address] Date: 02/04/2024 Prepared by: Peggye Linear  Exercises - Standing March with  Counter Support  - 2 x daily - 7 x weekly - 2 sets - 20 reps - Side stepping with counter support: yellow band loop  - 2 x daily - 7 x weekly - 2 sets - 20 reps - Sit to Stand with Counter Support  - 2 x daily - 7 x weekly - 2 sets - 12 reps   GOALS: Goals reviewed with patient? Yes  SHORT TERM GOALS: Target date: 03/10/2024  Pt will be independent with HEP in order to improve strength and balance in order to decrease fall risk and improve function at home and work.  Baseline: need to initiate7/16: 2-3x week Goal status:  MET  LONG TERM GOALS: Target date: 06/19/2024  1.  Patient will complete five times sit to stand test in < 15 seconds indicating an increased LE strength and improved balance. Baseline:  38.75 seconds, using UE support, 7/16: 20.35 sec, UE support on 1st STS only 8/20:16.86 sec no UE support 9/10:12.16 sec  Goal Status: MET   2.  Patient will increase ABC scale score >50% to demonstrate better functional mobility and better confidence with ADLs.   Baseline: 7.5%, 7/16: 18.125% 8/20: 18.75% 9/10: 16.25% Goal status: ONGOING/ REGRESSED/ ALTERED   3.  Patient will increase Berg Balance score by > 6 points to demonstrate decreased fall risk during functional activities. Baseline: need to assess; 01/30/2024= 34/56, 7/16= 39 8/20:42 Goal status: MET   4. Patient will increase 10 meter walk test to >1.33m/s as to improve gait speed for better community ambulation and to reduce fall risk. Baseline: 0.48 m/s using youth RW, requires CGA for safety, 7/16: 0.69 m/s 8/20:  .76 m/s 9/10:.70 m/s Goal status: ONGOING  5. Patient will increase six minute walk test distance to >1000 for progression to community ambulator and improve gait ability Baseline: need to assess; 01/30/2024=410 feet using RW; 02/13/24: 452ft in 50m30s, 7/16: 670ft using 2WW 8/20: 900 ft with RW  9/29: 835 ft with 4WW  Goal status: ONGOING  6.  Patient will improve dynamic gait index score by 4 points or greater  to indicate improvement in gait with a rolling walker as well as decreased risk for falls Baseline:9/17:10 Goal status: INITIAL  7.  Pt will report 70% or greater confidence when ambulating around her home and completing her usual daily activities with LRAD.  Baseline: 30-50%  Goal status: INITIAL    ASSESSMENT:  CLINICAL IMPRESSION:    Patient arrived with good motivation for completion of pt activities. This session consisted of 30 minutes of outdoor ambulation consisting of stairs, ramps, and uneven surfaces. Pt required minimal rest breaks to catch breath, but recovered quickly. Obstacle navigation was completed utilizing the blaze pods. Pt had some mild loss of balance while navigating obstacles requiring minimal assistance to right self. Pt was asked to think about activities she is still struggling with before next weeks progress note. Will reassess next week to determine progress made toward goals. Pt will continue to benefit from skilled physical therapy intervention to address impairments, improve QOL, and attain therapy goals.      OBJECTIVE IMPAIRMENTS: Abnormal gait, decreased activity tolerance, decreased balance, decreased endurance, decreased knowledge of use of DME, decreased mobility, difficulty walking, decreased strength, decreased safety awareness, impaired vision/preception, and pain.   ACTIVITY LIMITATIONS: carrying, lifting, bending, standing, squatting, stairs, transfers, bed mobility, bathing, toileting, dressing, reach over head, hygiene/grooming, and locomotion level  PARTICIPATION LIMITATIONS: meal prep, cleaning, laundry, driving, shopping, and community activity  PERSONAL FACTORS: Age, Time since onset of injury/illness/exacerbation, and 3+ comorbidities: Hypertension, B12 deficiency, type 2 diabetes, hyperlipidemia, CKD stage IIIa are also affecting patient's functional outcome.   REHAB POTENTIAL: Good  CLINICAL DECISION MAKING: Evolving/moderate  complexity  EVALUATION COMPLEXITY: Moderate  PLAN:  PT FREQUENCY: 1-2x/week  PT DURATION: 8 weeks  PLANNED INTERVENTIONS: 97164- PT Re-evaluation, 97750- Physical Performance Testing, 97110-Therapeutic exercises, 97530- Therapeutic activity, [id number]- Neuromuscular re-education, 97535- Self Care, [id number]- Manual therapy, [phone]- Gait training, [phone]- Orthotic Initial, [phone]- Orthotic/Prosthetic subsequent, [phone]- Canalith repositioning, [phone]- Electrical stimulation (manual), Patient/Family education, Balance training, Stair training, Taping, Joint mobilization, Spinal mobilization, Vestibular training, Visual/preceptual remediation/compensation, DME instructions, Cryotherapy, Moist heat, and Biofeedback  PLAN FOR NEXT SESSION:  - reaching to floor to pick things up without UE assist for improvement in functional activities.  -Progress Note  Documentation assisted by: Leonor Rode, SPT   Lonni KATHEE Gainer PT ,DPT Physical Therapist- [place]  [facility]

## 2024-06-18 ENCOUNTER — Encounter: Admitting: Speech Pathology

## 2024-06-18 ENCOUNTER — Ambulatory Visit: Admitting: Occupational Therapy

## 2024-06-18 ENCOUNTER — Ambulatory Visit: Admitting: Physical Therapy

## 2024-06-18 ENCOUNTER — Encounter: Admitting: Occupational Therapy

## 2024-06-18 DIAGNOSIS — R2681 Unsteadiness on feet: Secondary | ICD-10-CM | POA: Diagnosis not present

## 2024-06-18 DIAGNOSIS — R278 Other lack of coordination: Secondary | ICD-10-CM

## 2024-06-18 DIAGNOSIS — I69351 Hemiplegia and hemiparesis following cerebral infarction affecting right dominant side: Secondary | ICD-10-CM

## 2024-06-18 DIAGNOSIS — M6281 Muscle weakness (generalized): Secondary | ICD-10-CM

## 2024-06-18 DIAGNOSIS — R269 Unspecified abnormalities of gait and mobility: Secondary | ICD-10-CM

## 2024-06-18 NOTE — Therapy (Unsigned)
 OUTPATIENT PHYSICAL THERAPY PROGRESS NOTE/ TREATMENT  Patient Name: Haley Jones MRN: [id number] DOB:[birth year], 68 y.o., female Today's Date: 06/18/2024  PCP: Dr. Oneil Pinal REFERRING PROVIDER: Toribio Pitch, PA-C  END OF SESSION:   PT End of Session - 06/18/24 1701     Visit Number 40    Number of Visits 56    Date for Recertification  08/13/24    Authorization Type [employer]    Progress Note Due on Visit 40    PT Start Time 1405    PT Stop Time 1443    PT Time Calculation (min) 38 min    Equipment Utilized During Treatment Gait belt    Activity Tolerance Patient tolerated treatment well    Behavior During Therapy WFL for tasks assessed/performed           Past Medical History:  Diagnosis Date   Adnexal mass    Anxiety    Chronic back pain    Depression    Diabetes mellitus without complication (HCC)    Endometriosis 03/12/2018   GERD (gastroesophageal reflux disease)    Heart murmur 02/2018   undetected until she was an adult. 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   Weight loss 12/06/2017   Past Surgical History:  Procedure Laterality Date   ABDOMINAL HYSTERECTOMY  2008   ovaries also   CESAREAN SECTION  1994   COLONOSCOPY     COLONOSCOPY WITH PROPOFOL  N/A 02/12/2018   Procedure: COLONOSCOPY WITH PROPOFOL ;  Surgeon: Toledo, Ladell POUR, MD;  Location: [facility];  Service: Gastroenterology;  Laterality: N/A;   ESOPHAGOGASTRODUODENOSCOPY (EGD) WITH PROPOFOL  N/A 02/12/2018   Procedure: ESOPHAGOGASTRODUODENOSCOPY (EGD) WITH PROPOFOL ;  Surgeon: Toledo, Ladell POUR, MD;  Location: [facility];  Service: Gastroenterology;  Laterality: N/A;   EYE SURGERY Left 1973   muscle shortening to straighten cross eyes   LAPAROSCOPY N/A 02/20/2018   Procedure: LAPAROSCOPY DIAGNOSTIC, ( RESECTION OF RIGHT LOWER QUADRANT ABDOMINAL MASS);  Surgeon: Nicholaus Selinda Birmingham, MD;   Location: [facility];  Service: General;  Laterality: N/A;   ROTATOR CUFF REPAIR Left 2011   Patient Active Problem List   Diagnosis Date Noted   Cognitive communication deficit 04/07/2024   Aphasia 04/07/2024   Urinary incontinence as sequela of cerebrovascular accident (CVA) 02/11/2024   Depression with anxiety 01/04/2024   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   Acute CVA (cerebrovascular accident) (HCC) 12/20/2023   Primary hypertension 12/20/2023   Acute kidney injury superimposed on chronic kidney disease 12/20/2023   DM type 2 with diabetic mixed hyperlipidemia (HCC) 11/17/2022   Major depressive disorder, recurrent, 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   B12 deficiency 02/23/2021   SBO (small bowel obstruction) (HCC) 01/29/2019   Barrett's esophagus without dysplasia 04/15/2018   Weight loss 12/06/2017   Hoarse 12/06/2017   Lumbar disc disease 07/26/2017   Diabetes mellitus type 2, controlled, without complications (HCC) 02/09/2015   Surgical menopause 11/26/2014    ONSET DATE: 12/20/2023  REFERRING DIAG: P36.487 (ICD-10-CM) - Left middle cerebral artery stroke (HCC)   THERAPY DIAG:  Unsteadiness on feet  Abnormality of gait  Hemiplegia and hemiparesis following cerebral infarction affecting right dominant side (HCC)  Muscle weakness (generalized)  Other lack of coordination  Rationale for Evaluation and Treatment: Rehabilitation  SUBJECTIVE:  SUBJECTIVE STATEMENT:  Pt experienced a fall yesterday in her bathroom. She reports no injury and that she was able to get back up without assistance. She is unsure of what contributed to the fall but thinks she may have overdone it yesterday.   PERTINENT HISTORY:Pt states she is not sure the exact date of when her CVA happened because she kept falling at  home trying to care for herself and her daughter, but she finally went to the hospital when her sister insisted on it. Pt states her special needs daughter, Haley Jones, passed away recently while pt was in the hospital at [facility] before she was transferred to [facility] for stroke rehabilitation.Pt's sister states pt requires supervision/cuing for ADLs  to don clothes correctly, with correct orientation (potential apraxia?) Pt/sister report pt has R side inattention.Pt reports supposedly one of her legs is shorter than the other, believes her R LE might be the shorter one. 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     PAIN:  Are you having pain? No  PRECAUTIONS: Fall RED FLAGS: None  WEIGHT BEARING RESTRICTIONS: No FALLS: Has patient fallen in last 6 months? Yes. Number of falls 6+ before going to hospital and 1 fall since being home from CIR - pt states she was trying to gather her clothes by herself rather than waiting for her sister to get there to help her  LIVING ENVIRONMENT Lives with: lives alone and but pt's sister, Haley Jones, comes over daily to assist with ADLs and stays to provide supervision all day - pt states at night she would be able to walk to bathroom using RW if needed (but she wears depends in event of incontinence) Lives in: House/apartment Stairs: she has steps, but she doesn't have to use them (house was wheelchair accessible for her daughter)  Has following equipment at home: [employer] - 2 wheeled, shower chair, Grab bars, Ramped entry, and transport chair  PLOF: Independent, Independent with household mobility without device, Independent with homemaking with ambulation, Independent with gait, Independent with transfers, and she was the primary caregiver for her recently deceased daughter   CLOF: Sister provides supervision daily for pt to ambulate using RW safely and provides assist for ADLs (showering and dressing -  progressing towards supervision with these activities); Sister states pt requires cues to turn and step back fully prior to sitting to ensure her safety  PATIENT GOALS: get back to being able to go shopping with improved community level ambulation without the walker; return to driving   OBJECTIVE:  Note: Objective measures were completed at evaluation unless otherwise noted.  LOWER EXTREMITY MMT:    MMT Right Eval Left Eval  Hip flexion 3+, no pain 3+ with some L hip and back pain with this  Knee flexion 3+ 4  Knee extension 4- 4  Ankle dorsiflexion 3+ 4  Ankle plantarflexion 4- 4  (Blank rows = not tested)  TREATMENT DATE: 06/18/2024  Physical Performance Test or Measurement: a  physical performance test(s) or measurement (eg,  musculoskeletal, functional capacity), with written report,  each 15 mins    10 Meter Walk Test: Patient instructed to walk 10 meters (32.8 ft) as quickly and as safely as possible at their normal speed - Results: 0.88 m/s (11.42 seconds w/ FWW)  Cut off scores:   Household Ambulator  < 0.4 m/s  Limited Community Ambulator  0.4 - 0.8 m/s  [employer]  > 0.8 m/s  Increased fall risk  < 1.62m/s  Crossing a Street  >1.65m/s  MCID 0.05 m/s (small), 0.13 m/s (moderate), 0.06 m/s (significant)  (ANPTA Core Set of Outcome Measures for Adults with Neurologic Conditions, 2018)   6 Min Walk Test:  Instructed patient to ambulate as quickly and as safely as possible for 6 minutes using LRAD. Patient was allowed to take standing rest breaks without stopping the test, but if the patient required a sitting rest break the clock would be stopped and the test would be over.  - Results: 994 ft feet using a FWW.   Age Matched Norms (in meters): 87-69 yo M: 42 F: 54, 83-79 yo M: 42 F: 471, 46-89 yo M: 417 F: 392 MDC: 58.21 meters  (190.98 feet) or 50 meters (ANPTA Core Set of Outcome Measures for Adults with Neurologic Conditions, 2018)   DGI: PT instructed pt in DGI. See below for results. Demonstrates increased fall risk with score of 14/24. (<19 indicates increased fall risk)    Mini-best: Pt scores 8 / 28 on mini BEST balance test. Scores < 16 indicate increased risk for falls Mayer, Warren, & [place]) and MCID is 4 (Godi,et al, 2013)     [facility] PT Assessment - 06/18/24 0001       Standardized Balance Assessment   Standardized Balance Assessment Mini-BESTest;Dynamic Gait Index      Mini-BESTest   Sit To Stand Moderate: Comes to stand WITH use of hands on first attempt.    Rise to Toes Moderate: Heels up, but not full range (smaller than when holding hands), OR noticeable instability for 3 s.    Stand on one leg (left) Moderate: < 20 s    Stand on one leg (right) Moderate: < 20 s    Stand on one leg - lowest score 1    Compensatory Stepping Correction - Forward No step, OR would fall if not caught, OR falls spontaneously.    Compensatory Stepping Correction - Backward No step, OR would fall if not caught, OR falls spontaneously.    Compensatory Stepping Correction - Left Lateral Severe: Falls, or cannot step    Compensatory Stepping Correction - Right Lateral Severe:  Falls, or cannot step    Stepping Corredtion Lateral - lowest score 0    Stance - Feet together, eyes open, firm surface  Normal: 30s    Stance - Feet together, eyes closed, foam surface  Severe: Unable    Incline - Eyes Closed Severe: Unable    Change in Gait Speed Moderate: Unable to change walking speed or signs of imbalance    Walk with head turns - Horizontal Moderate: performs head turns with reduction in gait speed.    Walk with pivot turns Moderate:Turns with feet close SLOW (>4 steps) with good balance.    Step over obstacles Severe: Unable to step over box OR steps around box    Timed UP & GO with Dual Task Severe: Stops counting  while walking OR stops walking while counting.   TUG: 15.44; TUG Cog: 21.79 (unable to continue naming animals in alphabetical order)   Mini-BEST total score 8      Dynamic Gait Index   Level Surface Normal    Change in Gait Speed Mild Impairment    Gait with Horizontal Head Turns Mild Impairment    Gait with Vertical Head Turns Moderate Impairment    Gait and Pivot Turn Mild Impairment    Step Over Obstacle Severe Impairment    Step Around Obstacles Mild Impairment    Steps Mild Impairment    Total Score 14          Unless otherwise stated, CGA was provided and gait belt donned in order to ensure pt safety   PATIENT EDUCATION: Education details: PT POC, findings on assessment today, recommendation to continue using RW for all functional mobility Person educated: Patient and Caregiver Haley Jones Education method: Explanation Education comprehension: verbalized understanding and needs further education  HOME EXERCISE PROGRAM: Access Code: [email or web address] URL: [email or web address] Date: 02/04/2024 Prepared by: Peggye Linear  Exercises - Standing March with Counter Support  - 2 x daily - 7 x weekly - 2 sets - 20 reps - Side stepping with counter support: yellow band loop  - 2 x daily - 7 x weekly - 2 sets - 20 reps - Sit to Stand with Counter Support  - 2 x daily - 7 x weekly - 2 sets - 12 reps   GOALS: Goals reviewed with patient? Yes  SHORT TERM GOALS: Target date: 03/10/2024  Pt will be independent with HEP in order to improve strength and balance in order to decrease fall risk and improve function at home and work.  Baseline: need to initiate7/16: 2-3x week Goal status:  MET  LONG TERM GOALS: Target date: 06/19/2024  1.  Patient will complete five times sit to stand test in < 15 seconds indicating an increased LE strength and improved balance. Baseline:  38.75 seconds, using UE support, 7/16: 20.35 sec, UE support on 1st STS only 8/20:16.86 sec no UE support  9/10:12.16 sec  Goal Status: MET   2.  Patient will increase ABC scale score >50% to demonstrate better functional mobility and better confidence with ADLs.   Baseline: 7.5%, 7/16: 18.125% 8/20: 18.75% 9/10: 16.25% 11/5: 45% Goal status: ONGOING   3.  Patient will increase Berg Balance score by > 6 points to demonstrate decreased fall risk during functional activities. Baseline: need to assess; 01/30/2024= 34/56, 7/16= 39 8/20:42 Goal status: MET   4. Patient will increase 10 meter walk test to >1.6m/s as to improve gait speed for better community ambulation and to reduce fall risk. Baseline: 0.48 m/s using youth RW, requires CGA for safety, 7/16: 0.69 m/s 8/20:  .76 m/s 9/10:.70 m/s 11/05: 11:42 seconds using FWW, .88 m/s Goal status: ONGOING  5. Patient will increase six minute walk test distance to >1000 for progression to community ambulator and improve gait ability Baseline: need to assess; 01/30/2024=410 feet using RW; 02/13/24: 448ft in 40m30s, 7/16: 648ft using 2WW 8/20: 900 ft with RW  9/29: 835 ft with 4WW  11/5: 994 ft with RW Goal status: ONGOING  6.  Patient will improve dynamic gait index score by 4 points or greater to indicate improvement in gait with a rolling walker as well as decreased risk for falls Baseline:9/17:10 11/5: 14/24 Goal status: MET  7.  Pt will report 70% or greater confidence when ambulating around her home  and completing her usual daily activities with LRAD.  Baseline: 30-50%  Goal status: INITIAL  8.  Pt will improve mini-best score by 4 points or more to decrease fall risk during functional activity. Baseline: 8 Goal status: INITIAL    ASSESSMENT:  CLINICAL IMPRESSION:    Patient arrived for progress note today a little discouraged after experiencing a fall the day prior. The session consisted of re-evaluation of ABC scale, TUG, , , and DGI. Pt did not meet goals for ABC, TUG, , and but showed significant improvement compared  to last progress note indicating improved community mobility and decreased risk for falls. Pt did meet goal of improving DGI by 4 points demonstrating improvement in dynamic gait. Due to improvements pt completed Mini-best with new goal to improve score to indicate decreased fall risk during functional activity. Patient's condition has the potential to improve in response to therapy. Maximum improvement is yet to be obtained. The anticipated improvement is attainable and reasonable in a generally predictable time.      OBJECTIVE IMPAIRMENTS: Abnormal gait, decreased activity tolerance, decreased balance, decreased endurance, decreased knowledge of use of DME, decreased mobility, difficulty walking, decreased strength, decreased safety awareness, impaired vision/preception, and pain.   ACTIVITY LIMITATIONS: carrying, lifting, bending, standing, squatting, stairs, transfers, bed mobility, bathing, toileting, dressing, reach over head, hygiene/grooming, and locomotion level  PARTICIPATION LIMITATIONS: meal prep, cleaning, laundry, driving, shopping, and community activity  PERSONAL FACTORS: Age, Time since onset of injury/illness/exacerbation, and 3+ comorbidities: Hypertension, B12 deficiency, type 2 diabetes, hyperlipidemia, CKD stage IIIa are also affecting patient's functional outcome.   REHAB POTENTIAL: Good  CLINICAL DECISION MAKING: Evolving/moderate complexity  EVALUATION COMPLEXITY: Moderate  PLAN:  PT FREQUENCY: 1-2x/week  PT DURATION: 8 weeks  PLANNED INTERVENTIONS: 97164- PT Re-evaluation, 97750- Physical Performance Testing, 97110-Therapeutic exercises, 97530- Therapeutic activity, [id number]- Neuromuscular re-education, 97535- Self Care, [id number]- Manual therapy, [phone]- Gait training, [phone]- Orthotic Initial, [phone]- Orthotic/Prosthetic subsequent, [phone]- Canalith repositioning, [phone]- Electrical stimulation (manual), Patient/Family education, Balance training, Stair training, Taping, Joint  mobilization, Spinal mobilization, Vestibular training, Visual/preceptual remediation/compensation, DME instructions, Cryotherapy, Moist heat, and Biofeedback  PLAN FOR NEXT SESSION:  - reaching to floor to pick things up without UE assist for improvement in functional activities.  -Update HEP, obstacle navigation   Documentation assisted by: Leonor Rode, SPT   Lonni KATHEE Gainer PT ,DPT Physical Therapist- [place]  [facility]

## 2024-06-18 NOTE — Therapy (Signed)
 Occupational Therapy Progress/Discharge note  Dates of reporting period  8/202/25   to   06/18/24    Patient Name: Haley Jones MRN: [id number] DOB:1955/10/08, 68 y.o., female Today's Date: 06/18/2024  PCP: Cleotilde Oneil FALCON, MD REFERRING PROVIDER: Pegge Toribio PARAS, PA-C   OT End of Session - 06/18/24 1447     Visit Number 30    Number of Visits 48    Date for Recertification  06/25/24    OT Start Time 1445    OT Stop Time 1530    OT Time Calculation (min) 45 min    Activity Tolerance Patient tolerated treatment well    Behavior During Therapy [facility] for tasks assessed/performed                  Past Medical History:  Diagnosis Date   Adnexal mass    Anxiety    Chronic back pain    Depression    Diabetes mellitus without complication (HCC)    Endometriosis 03/12/2018   GERD (gastroesophageal reflux disease)    Heart murmur 02/2018   undetected until she was an adult. 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   Weight loss 12/06/2017   Past Surgical History:  Procedure Laterality Date   ABDOMINAL HYSTERECTOMY  2008   ovaries also   CESAREAN SECTION  1994   COLONOSCOPY     COLONOSCOPY WITH PROPOFOL  N/A 02/12/2018   Procedure: COLONOSCOPY WITH PROPOFOL ;  Surgeon: Toledo, Ladell POUR, MD;  Location: [facility] ENDOSCOPY;  Service: Gastroenterology;  Laterality: N/A;   ESOPHAGOGASTRODUODENOSCOPY (EGD) WITH PROPOFOL  N/A 02/12/2018   Procedure: ESOPHAGOGASTRODUODENOSCOPY (EGD) WITH PROPOFOL ;  Surgeon: Toledo, Ladell POUR, MD;  Location: [facility] ENDOSCOPY;  Service: Gastroenterology;  Laterality: N/A;   EYE SURGERY Left 1973   muscle shortening to straighten cross eyes   LAPAROSCOPY N/A 02/20/2018   Procedure: LAPAROSCOPY DIAGNOSTIC, ( RESECTION OF RIGHT LOWER QUADRANT ABDOMINAL MASS);  Surgeon: Nicholaus Selinda Birmingham, MD;  Location: [facility] ORS;  Service: General;  Laterality: N/A;   ROTATOR CUFF  REPAIR Left 2011   Patient Active Problem List   Diagnosis Date Noted   Depression with anxiety 01/04/2024   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   Acute CVA (cerebrovascular accident) (HCC) 12/20/2023   Primary hypertension 12/20/2023   Acute kidney injury superimposed on chronic kidney disease (HCC) 12/20/2023   DM type 2 with diabetic mixed hyperlipidemia (HCC) 11/17/2022   Major depressive disorder, recurrent, 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  Rationale for Evaluation and Treatment: Rehabilitation  SUBJECTIVE:   SUBJECTIVE STATEMENT:  Pt. reports  that she has made a lot of progress. Pt accompanied by: self  PERTINENT HISTORY: Pt. Is a 68 yo female with hx of a L MCA CVA. Pt. Was admitted into ED on 12/20/2023 with an onset of RUE weakness, numbness dysmetria, and visual deficits. Upon assessment, it was concluded that Pt. Experienced multiple infarcts in the L Parietal/Occipital region. PMHx: Dizziness, Falls  PRECAUTIONS: None  WEIGHT BEARING RESTRICTIONS: No  PAIN:  Are you having pain?  no  05/14/24: No pain 05/07/24: No pain 04/23/24: No pain 04/16/24: No pain 04/02/24: No pain 03/12/2024: 6/10 pain at the R dorsal forearm during gross grip strengthening task. 03/10/2024: No pain today. 03/05/2024: No pain, 2-3/10 level of discomfort during use of green resisitve clips.    FALLS: Has patient fallen in last 6 months? Yes. Number of falls 10  LIVING ENVIRONMENT: Lives with: lives with their family and lives alone Lives in: House/apartment Stairs: No,  Ramp Has following equipment at home: Walker - 2 wheeled, Shower bench, and bed side commode  PLOF: Independent  PATIENT GOALS: To be able to feed herself and hold a fork and knife better.   OBJECTIVE:  Note: Objective measures were completed at Evaluation unless otherwise noted.  HAND DOMINANCE: Left  ADLs: Overall ADLs:  Transfers/ambulation related to ADLs:  Eating: Independent  Grooming: independent UB Dressing: Pt. Has difficulty managing buttons LB Dressing: jean zippers are difficult to manipulate, hiking pants, and clothing negotiation are difficult. Toileting: Independent Bathing: Independent Tub Shower transfers: stepping into shower is difficult.  Equipment: Grab bars, shower bench, bedside commode  IADLs: Shopping: Does not currently do that/has not tried Light housekeeping: Does not currently do that/has not tried Meal Prep: Has not cooked in months, did not prior to CVA, sister provides meals. Community mobility: Relies on family, and friends Medication management: Assistance from sister to initiate medication management, she gives herself a shot for diabetes Financial management: Sister is assisting with monthly bill management Handwriting: Cursive form is 10% legible; Printed form is 90% legible  MOBILITY STATUS: Needs Assist: CGA and Hx of falls  POSTURE COMMENTS:  No Significant postural limitations Sitting balance: Good  ACTIVITY TOLERANCE: Activity tolerance: Good  FUNCTIONAL OUTCOME MEASURES:   UPPER EXTREMITY ROM:    Active ROM Right eval Right 03/03/2024 Right  04/02/24 Right  06/18/24 Left Eval [facility]  Shoulder flexion 50(58) 61(74) 62(76) 62(76)   Shoulder abduction 43(48) 45(58) 52(58) 52(58)   Shoulder adduction       Shoulder extension       Shoulder internal rotation       Shoulder external rotation       Elbow flexion 118(131) 125(128) 142(144) 142(145)   Elbow extension -24(-22) -5(0) 0(0) 0(0)   Wrist flexion 20 (21) 42(48)  64(72) 64(74)   Wrist extension 30(31) 30(34) 52(58) 64(70)   Wrist ulnar deviation       Wrist radial deviation       Wrist pronation       Wrist supination       (Blank rows = not tested)  UPPER EXTREMITY MMT:     MMT Right eval Right 03/03/2024 Right 04/02/24  Left eval  Shoulder flexion 2/5 2/5 2/5  [facility]  Shoulder abduction 2/5 2/5 2/5  [facility]  Shoulder adduction       Shoulder extension       Shoulder internal rotation       Shoulder external rotation       Middle trapezius       Lower trapezius       Elbow flexion 4-/5 4/5 4+/5  WFL  Elbow extension 4-/5 4/5 4+/5  WFL  Wrist flexion 2+/5 4-/5 4-/5  WFL  Wrist extension 3-/5 4-/5 4/5  WFL  Wrist ulnar deviation       Wrist radial deviation       Wrist pronation       Wrist supination       (  Blank rows = not tested)  HAND FUNCTION: Grip strength: Right: 21 lbs; Left: 14 lbs, Lateral pinch: Right: 7 lbs, Left: 4 lbs, and 3 point pinch: Right: 4 lbs, Left: 5 lbs  03/03/2024:  Grip strength: Right: 23#, Left: 28#, Lateral pinch: Right: 6#, Left: 5#, 3 point pinch: Right: 6#, Left: 3#  04/02/24:  Grip strength: Right: 31#, Left: 35#, Lateral pinch: Right: 8#, Left: 7#, 3 point pinch: Right: 8#, Left: 6#   06/18/24  Grip strength: Right: 32#, Left: 38#, Lateral Pinch strength with Saehan Pinch Meter: Right: 18#, Left: 11#, 3 point pinch: Right: 16#, Left: 13#   COORDINATION: 9 Hole Peg test: Right: 72 sec; Left: 40 sec  03/03/2024: 9 hole peg test: Right: 1 min 3 sec, Left: 36 sec  04/02/24  9 hole peg test: Right: 53 sec, Left: 30 sec  06/18/24:   9 hole peg test: Right: 53 sec, Left: 30 sec    SENSATION: Light touch: Impaired  Proprioception: Impaired   EDEMA: none  MUSCLE TONE: RUE: Mild  COGNITION: Overall cognitive status: Impaired  VISION: Subjective report: Pt. has not noticed the changes in vision but her sister has. Caregiver reports that Pt. Does not bring much attention to the R side.   Baseline vision:  Visual history:   VISION ASSESSMENT: TBD   PERCEPTION: TBD  PRAXIS: Impaired: Motor planning                                                                                                                           TREATMENT DATE: 06/18/2024   Measurements were obtained, and goals were reviewed with the Pt.   PATIENT EDUCATION: Education details: visual perceptual skills, visual compensatory strategies , RUE weakness, BUE functioning,  Right shoulder ROM Person educated: Patient and Sister Education method: Explanation, Demonstration, Tactile cues, and Verbal cues Education comprehension: verbalized understanding and returned demonstration  HOME EXERCISE PROGRAM: Yellow theraputty HEP   GOALS: Goals reviewed with patient? Yes  SHORT TERM GOALS: Target date: 03/10/2024    Pt. Will be independent utilizing HEPs for hand strengthening exercises.  Baseline: Eval: No current HEP program Goal status: INITIAL   LONG TERM GOALS: Target date: 04/21/2024   Pt. Will improve BUE grip strength by 5# of force to be able to open jars. Baseline: 06/18/24: Grip strength: Right: 32#, Left: 38#,Modified [place] 04/02/24: Grip strength: Right: 31#, Left: 35# 03/03/2024: R grip strength: 23#, L grip strength: 28#. Pt. has difficulty securely holding items in her hand. Eval: R grip strength: 21#, L grip strength: 14# Goal status:  Achieved  2.  Pt. Will improve RUE [facility] skills by 3 sec. to be able to manipulate  small objects. Baseline: 06/18/24: 9 hole peg test: Right: 53 sec, Left: 30 sec 04/02/24: 9 hole peg test: Right: 53 sec, Left: 30 sec 03/03/2024: 9 hole peg test: R side: 1 min 3 sec, L side: 36 sec. 9 hole peg test; R side: 72 secs, L side:  40 secs Goal status: Achieved  3.  Pt. Will increase R shoulder flex/shoulder ABD ROM by 10 degrees to perform ADLs/IADLs.  Baseline: 06/18/24:Right shoulder flexion: 62(76), Abduction: 52(58) 04/02/24: Right shoulder flexion:  62(76), Abduction: 52(58) 03/03/2024: R shoulder flex: 61(74), R shoulder ABD: 45(58) R shoulder flex: 50(58), R shoulder ABD: 43(48) Goal status: Achieved  4.  Pt. Will improve handwriting to 50% legible in cursive form, legibility to be able to send written correspondence Baseline: 03/03/2024: 50% legible in cursive form, Eval: 10% legible in cursive form, 90% legible in printed form. Goal status: Discontinue goal per Pt. report that it has returned to baseline.  5.  Pt. Will improve BUE pinch strength by 2# of force to  be able to open medication bottles Baseline: 06/18/24: Lateral Pinch strength with Saehan Pinch Meter: Right: 18#, Left: 11#, 3 point pinch: Right: 16#, Left: 13#  04/02/24: Lateral pinch: Right: 8#, Left: 7#, 3 point pinch: Right: 8#, Left: 6# 03/03/2024: Lateral Pinch: R: 6#, L: 5#, 3 pt pinch: R: 6#, L: 3#.  Eval: Lateral Pinch: 7, R 3 pt. Pinch: 4, L Lateral Pinch: 4, L 3 pt. Pinch: 5 Goal status: Achieved  6.  Pt. Will increase R wrist extension/flexion by 5 degrees in anticipation of reaching hold of items for ADLs. Baseline: 04/02/24: 52(58) 03/03/2024: R wrist ext: 30 (34), R Wrist flex: 42(48) Eval: R Wrist ext: 20(21), R Wrist flex: 30(31) Goal status: Achieved  ASSESSMENT:  CLINICAL IMPRESSION:  Measurements were obtained and goals were reviewed with the patient.  Patient has overall made progress since the initial evaluation.  Patient has met most goals including the goals for right grip strength to help with opening jars, improving fine motor coordination skills to assist with manipulating small objects, improving pinch strength to assist with opening medication bottles and improving wrist motion in preparation for anticipating reaching for items for ADLs. Patient continues to present with increased tightness of the right shoulder from an old injury limiting her ability to functionally reach up efficiently and daily tasks.  Patient continues to require cues for  right-sided awareness. Patient has reached a plateau with it at this time.  Patient is now ready for discharge, and is in agreement.  No additional OT follow-up services are warranted at this time.  PERFORMANCE DEFICITS: in functional skills including ADLs, IADLs, coordination, dexterity, proprioception, sensation, tone, ROM, strength, pain, Fine motor control, Gross motor control, endurance, and UE functional use, and psychosocial skills including environmental adaptation, habits, interpersonal interactions, and routines and behaviors.   IMPAIRMENTS: are limiting patient from ADLs, IADLs, rest and sleep, work, leisure, and social participation.   CO-MORBIDITIES: may have co-morbidities  that affects occupational performance. Patient will benefit from skilled OT to address above impairments and improve overall function.  MODIFICATION OR ASSISTANCE TO COMPLETE EVALUATION: Min-Moderate modification of tasks or assist with assess necessary to complete an evaluation.  OT OCCUPATIONAL PROFILE AND HISTORY: Detailed assessment: Review of records and additional review of physical, cognitive, psychosocial history related to current functional performance.  CLINICAL DECISION MAKING: Moderate - several treatment options, min-mod task modification necessary  REHAB POTENTIAL: Good  EVALUATION COMPLEXITY: Moderate    PLAN:  OT FREQUENCY: 1x/week  OT DURATION: 12 weeks  PLANNED INTERVENTIONS: 97535 self care/ADL training, 02889 therapeutic exercise, 97530 therapeutic activity, 97112 neuromuscular re-education, 97018 paraffin, 02989 moist heat, 97010 cryotherapy, 97034 contrast bath, 97032 electrical stimulation (manual), passive range of motion, visual/perceptual remediation/compensation, energy conservation, patient/family education, and DME and/or AE instructions  RECOMMENDED OTHER SERVICES: ST & PT  CONSULTED AND AGREED WITH PLAN OF CARE: Patient and family member/caregiver  PLAN FOR NEXT  SESSION: treatment   Susy Placzek, MS, OTR/L   06/18/2024, 2:48 PM

## 2024-06-23 ENCOUNTER — Encounter: Admitting: Occupational Therapy

## 2024-06-23 ENCOUNTER — Encounter: Admitting: Speech Pathology

## 2024-06-23 ENCOUNTER — Ambulatory Visit: Admitting: Physical Therapy

## 2024-06-23 DIAGNOSIS — I69351 Hemiplegia and hemiparesis following cerebral infarction affecting right dominant side: Secondary | ICD-10-CM

## 2024-06-23 DIAGNOSIS — R2681 Unsteadiness on feet: Secondary | ICD-10-CM | POA: Diagnosis not present

## 2024-06-23 DIAGNOSIS — M6281 Muscle weakness (generalized): Secondary | ICD-10-CM

## 2024-06-23 DIAGNOSIS — R269 Unspecified abnormalities of gait and mobility: Secondary | ICD-10-CM

## 2024-06-23 DIAGNOSIS — R278 Other lack of coordination: Secondary | ICD-10-CM

## 2024-06-23 NOTE — Therapy (Signed)
 OUTPATIENT PHYSICAL THERAPY TREATMENT  Patient Name: Haley Jones MRN: [id number] DOB:01/13/1956, 68 y.o., female Today's Date: 06/23/2024  PCP: Dr. Oneil Pinal REFERRING PROVIDER: Toribio Pitch, PA-C  END OF SESSION:   PT End of Session - 06/23/24 1507     Visit Number 41    Number of Visits 56    Date for Recertification  08/13/24    Authorization Type [employer]    Progress Note Due on Visit 40    PT Start Time 1401    PT Stop Time 1442    PT Time Calculation (min) 41 min    Equipment Utilized During Treatment Gait belt    Activity Tolerance Patient tolerated treatment well    Behavior During Therapy WFL for tasks assessed/performed           Past Medical History:  Diagnosis Date   Adnexal mass    Anxiety    Chronic back pain    Depression    Diabetes mellitus without complication (HCC)    Endometriosis 03/12/2018   GERD (gastroesophageal reflux disease)    Heart murmur 02/2018   undetected until she was an adult. 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   Weight loss 12/06/2017   Past Surgical History:  Procedure Laterality Date   ABDOMINAL HYSTERECTOMY  2008   ovaries also   CESAREAN SECTION  1994   COLONOSCOPY     COLONOSCOPY WITH PROPOFOL  N/A 02/12/2018   Procedure: COLONOSCOPY WITH PROPOFOL ;  Surgeon: Toledo, Ladell POUR, MD;  Location: [facility];  Service: Gastroenterology;  Laterality: N/A;   ESOPHAGOGASTRODUODENOSCOPY (EGD) WITH PROPOFOL  N/A 02/12/2018   Procedure: ESOPHAGOGASTRODUODENOSCOPY (EGD) WITH PROPOFOL ;  Surgeon: Toledo, Ladell POUR, MD;  Location: [facility];  Service: Gastroenterology;  Laterality: N/A;   EYE SURGERY Left 1973   muscle shortening to straighten cross eyes   LAPAROSCOPY N/A 02/20/2018   Procedure: LAPAROSCOPY DIAGNOSTIC, ( RESECTION OF RIGHT LOWER QUADRANT ABDOMINAL MASS);  Surgeon: Nicholaus Selinda Birmingham, MD;  Location: [facility]  ORS;  Service: General;  Laterality: N/A;   ROTATOR CUFF REPAIR Left 2011   Patient Active Problem List   Diagnosis Date Noted   Cognitive communication deficit 04/07/2024   Aphasia 04/07/2024   Urinary incontinence as sequela of cerebrovascular accident (CVA) 02/11/2024   Depression with anxiety 01/04/2024   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   Acute CVA (cerebrovascular accident) (HCC) 12/20/2023   Primary hypertension 12/20/2023   Acute kidney injury superimposed on chronic kidney disease 12/20/2023   DM type 2 with diabetic mixed hyperlipidemia (HCC) 11/17/2022   Major depressive disorder, recurrent, 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   B12 deficiency 02/23/2021   SBO (small bowel obstruction) (HCC) 01/29/2019   Barrett's esophagus without dysplasia 04/15/2018   Weight loss 12/06/2017   Hoarse 12/06/2017   Lumbar disc disease 07/26/2017   Diabetes mellitus type 2, controlled, without complications (HCC) 02/09/2015   Surgical menopause 11/26/2014    ONSET DATE: 12/20/2023  REFERRING DIAG: P36.487 (ICD-10-CM) - Left middle cerebral artery stroke (HCC)   THERAPY DIAG:  Muscle weakness (generalized)  Other lack of coordination  Unsteadiness on feet  Hemiplegia and hemiparesis following cerebral infarction affecting right dominant side (HCC)  Abnormality of gait  Rationale for Evaluation and Treatment: Rehabilitation  SUBJECTIVE:  SUBJECTIVE STATEMENT:  Pt is doing well today. She reports no new falls, but she feels like she is relying on her walker more than usual due to a decrease in confidence    PERTINENT HISTORY:Pt states she is not sure the exact date of when her CVA happened because she kept falling at home trying to care for herself and her daughter, but she finally went to the hospital  when her sister insisted on it. Pt states her special needs daughter, [name], passed away recently while pt was in the hospital at [facility] before she was transferred to [facility] for stroke rehabilitation.Pt's sister states pt requires supervision/cuing for ADLs  to don clothes correctly, with correct orientation (potential apraxia?) Pt/sister report pt has R side inattention.Pt reports supposedly one of her legs is shorter than the other, believes her R LE might be the shorter one. 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     PAIN:  Are you having pain? No  PRECAUTIONS: Fall RED FLAGS: None  WEIGHT BEARING RESTRICTIONS: No FALLS: Has patient fallen in last 6 months? Yes. Number of falls 6+ before going to hospital and 1 fall since being home from CIR - pt states she was trying to gather her clothes by herself rather than waiting for her sister to get there to help her  LIVING ENVIRONMENT Lives with: lives alone and but pt's sister, [name], comes over daily to assist with ADLs and stays to provide supervision all day - pt states at night she would be able to walk to bathroom using RW if needed (but she wears depends in event of incontinence) Lives in: House/apartment Stairs: she has steps, but she doesn't have to use them (house was wheelchair accessible for her daughter)  Has following equipment at home: [employer]ant - 2 wheeled, shower chair, Grab bars, Ramped entry, and transport chair  PLOF: Independent, Independent with household mobility without device, Independent with homemaking with ambulation, Independent with gait, Independent with transfers, and she was the primary caregiver for her recently deceased daughter   CLOF: Sister provides supervision daily for pt to ambulate using RW safely and provides assist for ADLs (showering and dressing - progressing towards supervision with these activities); Sister states pt requires cues to  turn and step back fully prior to sitting to ensure her safety  PATIENT GOALS: get back to being able to go shopping with improved community level ambulation without the walker; return to driving   OBJECTIVE:  Note: Objective measures were completed at evaluation unless otherwise noted.  LOWER EXTREMITY MMT:    MMT Right Eval Left Eval  Hip flexion 3+, no pain 3+ with some L hip and back pain with this  Knee flexion 3+ 4  Knee extension 4- 4  Ankle dorsiflexion 3+ 4  Ankle plantarflexion 4- 4  (Blank rows = not tested)  TREATMENT DATE: 06/23/2024  TA- To improve functional movements patterns for everyday tasks    Gait through hallway with horizontal head turns; dual task verbalizing letters on wall- 250 ft x 2, 2.5 AW  - one LOB that required Min A to recover  3kg ball pick up from elevated surface- 2x10   Gait with varying speed-  228ft   -Pt demonstrated minimal change in speed  NMR: To facilitate reeducation of movement, balance, posture, coordination, and/or proprioception/kinesthetic sense.   Single Leg balance foot supported by ball 3x30 sec  Rotational step over 1/2 foam roller 2 x 10 bilateral -difficulty with coordination, UE support with returning to starting position  -Requires verbal and visual cueing throughout  Unless otherwise stated, CGA was provided and gait belt donned in order to ensure pt safety   PATIENT EDUCATION: Education details: Pt educated throughout session about proper posture and technique with exercises. Improved exercise technique, movement at target joints, use of target muscles after min to mod verbal, visual, tactile cues.  Person educated: Patient and Caregiver [name] Education method: Explanation Education comprehension: verbalized understanding and needs further education  HOME EXERCISE PROGRAM: Access Code:  [email or web address] URL: [email or web address] Date: 02/04/2024 Prepared by: Peggye Linear  Exercises - Standing March with Counter Support  - 2 x daily - 7 x weekly - 2 sets - 20 reps - Side stepping with counter support: yellow band loop  - 2 x daily - 7 x weekly - 2 sets - 20 reps - Sit to Stand with Counter Support  - 2 x daily - 7 x weekly - 2 sets - 12 reps   GOALS: Goals reviewed with patient? Yes  SHORT TERM GOALS: Target date: 03/10/2024  Pt will be independent with HEP in order to improve strength and balance in order to decrease fall risk and improve function at home and work.  Baseline: need to initiate7/16: 2-3x week Goal status:  MET  LONG TERM GOALS: Target date: 08/13/2024  1.  Patient will complete five times sit to stand test in < 15 seconds indicating an increased LE strength and improved balance. Baseline:  38.75 seconds, using UE support, 7/16: 20.35 sec, UE support on 1st STS only 8/20:16.86 sec no UE support 9/10:12.16 sec  Goal Status: MET   2.  Patient will increase ABC scale score >50% to demonstrate better functional mobility and better confidence with ADLs.   Baseline: 7.5%, 7/16: 18.125% 8/20: 18.75% 9/10: 16.25% 11/5: 45% Goal status: ONGOING   3.  Patient will increase Berg Balance score by > 6 points to demonstrate decreased fall risk during functional activities. Baseline: need to assess; 01/30/2024= 34/56, 7/16= 39 8/20:42 Goal status: MET   4. Patient will increase 10 meter walk test to >1.17m/s as to improve gait speed for better community ambulation and to reduce fall risk. Baseline: 0.48 m/s using youth RW, requires CGA for safety, 7/16: 0.69 m/s 8/20:  .76 m/s 9/10:.70 m/s 11/05: 11:42 seconds using FWW, .88 m/s Goal status: ONGOING  5. Patient will increase six minute walk test distance to >1000 for progression to community ambulator and improve gait ability Baseline: need to assess; 01/30/2024=410 feet using RW; 02/13/24: 445ft in 49m30s,  7/16: 663ft using 2WW 8/20: 900 ft with RW  9/29: 835 ft with 4WW  11/5: 994 ft with RW Goal status: ONGOING  6.  Patient will improve dynamic gait index score by 4 points or greater to indicate improvement in gait with a rolling walker as well as decreased  risk for falls Baseline:9/17:10 11/5: 14/24 Goal status: MET  7.  Pt will report 70% or greater confidence when ambulating around her home and completing her usual daily activities with LRAD.  Baseline: 30-50%  11/5: 100%  Goal status: MET  8.  Pt will improve mini-best score by 4 points or more to decrease fall risk during functional activity. Baseline: 8 Goal status: INITIAL    ASSESSMENT:  CLINICAL IMPRESSION:   Progressed pt this week based on progress note findings. Exercises consisted of dynamic balance focusing on changing directions. Pt had difficulty with rotation stepping over 1/2 foam roller which required frequent visual and verbal cueing. During gait with horizontal head turns pt lost balance and required min assist to regain upright position. Pt did well with single leg balance with last round holding for 25 sec before reaching for the bar. Pt will continue to benefit from skilled physical therapy intervention to address impairments, improve QOL, and attain therapy goals.   OBJECTIVE IMPAIRMENTS: Abnormal gait, decreased activity tolerance, decreased balance, decreased endurance, decreased knowledge of use of DME, decreased mobility, difficulty walking, decreased strength, decreased safety awareness, impaired vision/preception, and pain.   ACTIVITY LIMITATIONS: carrying, lifting, bending, standing, squatting, stairs, transfers, bed mobility, bathing, toileting, dressing, reach over head, hygiene/grooming, and locomotion level  PARTICIPATION LIMITATIONS: meal prep, cleaning, laundry, driving, shopping, and community activity  PERSONAL FACTORS: Age, Time since onset of injury/illness/exacerbation, and 3+ comorbidities:  Hypertension, B12 deficiency, type 2 diabetes, hyperlipidemia, CKD stage IIIa are also affecting patient's functional outcome.   REHAB POTENTIAL: Good  CLINICAL DECISION MAKING: Evolving/moderate complexity  EVALUATION COMPLEXITY: Moderate  PLAN:  PT FREQUENCY: 1-2x/week  PT DURATION: 8 weeks  PLANNED INTERVENTIONS: [id number]- PT Re-evaluation, 97750- Physical Performance Testing, 97110-Therapeutic exercises, 97530- Therapeutic activity, [id number]- Neuromuscular re-education, 97535- Self Care, [id number]- Manual therapy, [id number]- Gait training, [phone]- Orthotic Initial, [phone]- Orthotic/Prosthetic subsequent, [phone]- Canalith repositioning, [phone]- Electrical stimulation (manual), Patient/Family education, Balance training, Stair training, Taping, Joint mobilization, Spinal mobilization, Vestibular training, Visual/preceptual remediation/compensation, DME instructions, Cryotherapy, Moist heat, and Biofeedback  PLAN FOR NEXT SESSION:  - obstacle navigation  - Gait with directional/ speed changes   Leonor Rode, SPT

## 2024-06-25 ENCOUNTER — Ambulatory Visit: Admitting: Occupational Therapy

## 2024-06-25 ENCOUNTER — Encounter: Admitting: Occupational Therapy

## 2024-06-25 ENCOUNTER — Encounter: Admitting: Speech Pathology

## 2024-06-25 ENCOUNTER — Ambulatory Visit: Admitting: Physical Therapy

## 2024-06-25 DIAGNOSIS — R269 Unspecified abnormalities of gait and mobility: Secondary | ICD-10-CM

## 2024-06-25 DIAGNOSIS — R2681 Unsteadiness on feet: Secondary | ICD-10-CM

## 2024-06-25 DIAGNOSIS — R278 Other lack of coordination: Secondary | ICD-10-CM

## 2024-06-25 DIAGNOSIS — I69351 Hemiplegia and hemiparesis following cerebral infarction affecting right dominant side: Secondary | ICD-10-CM

## 2024-06-25 DIAGNOSIS — M6281 Muscle weakness (generalized): Secondary | ICD-10-CM

## 2024-06-25 NOTE — Therapy (Signed)
 OUTPATIENT PHYSICAL THERAPY TREATMENT  Patient Name: Haley Jones MRN: [id number] DOB:Jul 29, 1956, 68 y.o., female Today's Date: 06/25/2024  PCP: Dr. Oneil Pinal REFERRING PROVIDER: Toribio Pitch, PA-C  END OF SESSION:   PT End of Session - 06/25/24 1450     Visit Number 42    Number of Visits 56    Date for Recertification  08/13/24    Authorization Type [employer]    Progress Note Due on Visit 40    PT Start Time 1405    PT Stop Time 1445    PT Time Calculation (min) 40 min    Equipment Utilized During Treatment Gait belt    Activity Tolerance Patient tolerated treatment well    Behavior During Therapy WFL for tasks assessed/performed           Past Medical History:  Diagnosis Date   Adnexal mass    Anxiety    Chronic back pain    Depression    Diabetes mellitus without complication (HCC)    Endometriosis 03/12/2018   GERD (gastroesophageal reflux disease)    Heart murmur 02/2018   undetected until she was an adult. 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   Weight loss 12/06/2017   Past Surgical History:  Procedure Laterality Date   ABDOMINAL HYSTERECTOMY  2008   ovaries also   CESAREAN SECTION  1994   COLONOSCOPY     COLONOSCOPY WITH PROPOFOL  N/A 02/12/2018   Procedure: COLONOSCOPY WITH PROPOFOL ;  Surgeon: Toledo, Ladell POUR, MD;  Location: [facility];  Service: Gastroenterology;  Laterality: N/A;   ESOPHAGOGASTRODUODENOSCOPY (EGD) WITH PROPOFOL  N/A 02/12/2018   Procedure: ESOPHAGOGASTRODUODENOSCOPY (EGD) WITH PROPOFOL ;  Surgeon: Toledo, Ladell POUR, MD;  Location: [facility];  Service: Gastroenterology;  Laterality: N/A;   EYE SURGERY Left 1973   muscle shortening to straighten cross eyes   LAPAROSCOPY N/A 02/20/2018   Procedure: LAPAROSCOPY DIAGNOSTIC, ( RESECTION OF RIGHT LOWER QUADRANT ABDOMINAL MASS);  Surgeon: Nicholaus Selinda Birmingham, MD;  Location: [facility]  ORS;  Service: General;  Laterality: N/A;   ROTATOR CUFF REPAIR Left 2011   Patient Active Problem List   Diagnosis Date Noted   Cognitive communication deficit 04/07/2024   Aphasia 04/07/2024   Urinary incontinence as sequela of cerebrovascular accident (CVA) 02/11/2024   Depression with anxiety 01/04/2024   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   Acute CVA (cerebrovascular accident) (HCC) 12/20/2023   Primary hypertension 12/20/2023   Acute kidney injury superimposed on chronic kidney disease 12/20/2023   DM type 2 with diabetic mixed hyperlipidemia (HCC) 11/17/2022   Major depressive disorder, recurrent, 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   B12 deficiency 02/23/2021   SBO (small bowel obstruction) (HCC) 01/29/2019   Barrett's esophagus without dysplasia 04/15/2018   Weight loss 12/06/2017   Hoarse 12/06/2017   Lumbar disc disease 07/26/2017   Diabetes mellitus type 2, controlled, without complications (HCC) 02/09/2015   Surgical menopause 11/26/2014    ONSET DATE: 12/20/2023  REFERRING DIAG: P36.487 (ICD-10-CM) - Left middle cerebral artery stroke (HCC)   THERAPY DIAG:  Muscle weakness (generalized)  Unsteadiness on feet  Other lack of coordination  Hemiplegia and hemiparesis following cerebral infarction affecting right dominant side (HCC)  Abnormality of gait  Rationale for Evaluation and Treatment: Rehabilitation  SUBJECTIVE:  SUBJECTIVE STATEMENT:  Pt is doing well, no recent falls or changes since last appointment.   PERTINENT HISTORY:Pt states she is not sure the exact date of when her CVA happened because she kept falling at home trying to care for herself and her daughter, but she finally went to the hospital when her sister insisted on it. Pt states her special needs daughter, [name],  passed away recently while pt was in the hospital at [facility] before she was transferred to [facility] for stroke rehabilitation.Pt's sister states pt requires supervision/cuing for ADLs  to don clothes correctly, with correct orientation (potential apraxia?) Pt/sister report pt has R side inattention.Pt reports supposedly one of her legs is shorter than the other, believes her R LE might be the shorter one. 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     PAIN:  Are you having pain? No  PRECAUTIONS: Fall RED FLAGS: None  WEIGHT BEARING RESTRICTIONS: No FALLS: Has patient fallen in last 6 months? Yes. Number of falls 6+ before going to hospital and 1 fall since being home from [facility] - pt states she was trying to gather her clothes by herself rather than waiting for her sister to get there to help her  LIVING ENVIRONMENT Lives with: lives alone and but pt's sister, [name], comes over daily to assist with ADLs and stays to provide supervision all day - pt states at night she would be able to walk to bathroom using RW if needed (but she wears depends in event of incontinence) Lives in: House/apartment Stairs: she has steps, but she doesn't have to use them (house was wheelchair accessible for her daughter)  Has following equipment at home: Environmental Consultant - 2 wheeled, shower chair, Grab bars, Ramped entry, and transport chair  PLOF: Independent, Independent with household mobility without device, Independent with homemaking with ambulation, Independent with gait, Independent with transfers, and she was the primary caregiver for her recently deceased daughter   CLOF: Sister provides supervision daily for pt to ambulate using RW safely and provides assist for ADLs (showering and dressing - progressing towards supervision with these activities); Sister states pt requires cues to turn and step back fully prior to sitting to ensure her safety  PATIENT GOALS:  get back to being able to go shopping with improved community level ambulation without the walker; return to driving   OBJECTIVE:  Note: Objective measures were completed at evaluation unless otherwise noted.  LOWER EXTREMITY MMT:    MMT Right Eval Left Eval  Hip flexion 3+, no pain 3+ with some L hip and back pain with this  Knee flexion 3+ 4  Knee extension 4- 4  Ankle dorsiflexion 3+ 4  Ankle plantarflexion 4- 4  (Blank rows = not tested)  TREATMENT DATE: 06/25/2024  TA- To improve functional movements patterns for everyday tasks    Gait with 4WW, 2.5# AW x 1000 ft, 12 min  Gait through hallway with horizontal head turns; dual task verbalizing letters on wall- length of hallway x 4 -several distractions throughout the hallway created challenges with dual tasking, verbal cueing to continue naming letters  STS with 3kg ball- 2x15  Obstacle course consisting of two hurdle step overs and toe taps to stepping stones x 3 laps -Mild LOB during first lap, verbal cueing to lift knee when stepping   improved overall clearance of hurdles -Sitting break between each lap  Unless otherwise stated, CGA was provided and gait belt donned in order to ensure pt safety  Pt required occasional rest breaks due fatigue, PT was attentive to when pt appeared to be tired or winded in order to prevent excessive fatigue.   PATIENT EDUCATION: Education details: Pt educated throughout session about proper posture and technique with exercises. Improved exercise technique, movement at target joints, use of target muscles after min to mod verbal, visual, tactile cues.  Person educated: Patient and Caregiver [name] Education method: Explanation Education comprehension: verbalized understanding and needs further education  HOME EXERCISE PROGRAM: Access Code: [email or web address] URL:  [email or web address] Date: 02/04/2024 Prepared by: Peggye Linear  Exercises - Standing March with Counter Support  - 2 x daily - 7 x weekly - 2 sets - 20 reps - Side stepping with counter support: yellow band loop  - 2 x daily - 7 x weekly - 2 sets - 20 reps - Sit to Stand with Counter Support  - 2 x daily - 7 x weekly - 2 sets - 12 reps   GOALS: Goals reviewed with patient? Yes  SHORT TERM GOALS: Target date: 03/10/2024  Pt will be independent with HEP in order to improve strength and balance in order to decrease fall risk and improve function at home and work.  Baseline: need to initiate7/16: 2-3x week Goal status:  MET  LONG TERM GOALS: Target date: 08/13/2024  1.  Patient will complete five times sit to stand test in < 15 seconds indicating an increased LE strength and improved balance. Baseline:  38.75 seconds, using UE support, 7/16: 20.35 sec, UE support on 1st STS only 8/20:16.86 sec no UE support 9/10:12.16 sec  Goal Status: MET   2.  Patient will increase ABC scale score >50% to demonstrate better functional mobility and better confidence with ADLs.   Baseline: 7.5%, 7/16: 18.125% 8/20: 18.75% 9/10: 16.25% 11/5: 45% Goal status: ONGOING   3.  Patient will increase Berg Balance score by > 6 points to demonstrate decreased fall risk during functional activities. Baseline: need to assess; 01/30/2024= 34/56, 7/16= 39 8/20:42 Goal status: MET   4. Patient will increase 10 meter walk test to >1.52m/s as to improve gait speed for better community ambulation and to reduce fall risk. Baseline: 0.48 m/s using youth RW, requires CGA for safety, 7/16: 0.69 m/s 8/20:  .76 m/s 9/10:.70 m/s 11/05: 11:42 seconds using FWW, .88 m/s Goal status: ONGOING  5. Patient will increase six minute walk test distance to >1000 for progression to community ambulator and improve gait ability Baseline: need to assess; 01/30/2024=410 feet using RW; 02/13/24: 441ft in 43m30s, 7/16: 654ft  using 2WW 8/20: 900 ft with RW  9/29: 835 ft with 4WW  11/5: 994 ft with RW Goal status: ONGOING  6.  Patient will improve dynamic gait index score by 4 points or greater  to indicate improvement in gait with a rolling walker as well as decreased risk for falls Baseline:9/17:10 11/5: 14/24 Goal status: MET  7.  Pt will report 70% or greater confidence when ambulating around her home and completing her usual daily activities with LRAD.  Baseline: 30-50%  11/5: 100%  Goal status: MET  8.  Pt will improve mini-best score by 4 points or more to decrease fall risk during functional activity. Baseline: 8 Goal status: INITIAL    ASSESSMENT:  CLINICAL IMPRESSION:   Pt demonstrated great effort this session. Exercises consisted of dynamic balance focusing on obstacle navigation and gait. Pt had difficulty with the obstacle course with occasional LOB that the patient was able to recover from without SPT assistance. Obstacle course caused some minor fatigue so breaks were given between laps. Verbal cueing to lift knee during hurdle helped patient successfully step over without LOB. During gait with horizontal head turns there were many obstacles to navigate that created distraction and verbal cues were given to return to task of naming letters. Pt demonstrated a decreased gait speed throughout session, so focus for next session will include variation in speed. Pt will continue to benefit from skilled physical therapy intervention to address impairments, improve QOL, and attain therapy goals.   OBJECTIVE IMPAIRMENTS: Abnormal gait, decreased activity tolerance, decreased balance, decreased endurance, decreased knowledge of use of DME, decreased mobility, difficulty walking, decreased strength, decreased safety awareness, impaired vision/preception, and pain.   ACTIVITY LIMITATIONS: carrying, lifting, bending, standing, squatting, stairs, transfers, bed mobility, bathing, toileting, dressing, reach over  head, hygiene/grooming, and locomotion level  PARTICIPATION LIMITATIONS: meal prep, cleaning, laundry, driving, shopping, and community activity  PERSONAL FACTORS: Age, Time since onset of injury/illness/exacerbation, and 3+ comorbidities: Hypertension, B12 deficiency, type 2 diabetes, hyperlipidemia, CKD stage IIIa are also affecting patient's functional outcome.   REHAB POTENTIAL: Good  CLINICAL DECISION MAKING: Evolving/moderate complexity  EVALUATION COMPLEXITY: Moderate  PLAN:  PT FREQUENCY: 1-2x/week  PT DURATION: 8 weeks  PLANNED INTERVENTIONS: [id number]- PT Re-evaluation, 97750- Physical Performance Testing, 97110-Therapeutic exercises, 97530- Therapeutic activity, [id number]- Neuromuscular re-education, 97535- Self Care, [id number]- Manual therapy, [id number]- Gait training, [phone]- Orthotic Initial, [phone]- Orthotic/Prosthetic subsequent, [phone]- Canalith repositioning, [phone]- Electrical stimulation (manual), Patient/Family education, Balance training, Stair training, Taping, Joint mobilization, Spinal mobilization, Vestibular training, Visual/preceptual remediation/compensation, DME instructions, Cryotherapy, Moist heat, and Biofeedback  PLAN FOR NEXT SESSION:  Continue working on dynamic gait and balance Focus on gait speed - Lite gait?    Leonor Rode, SPT

## 2024-06-30 ENCOUNTER — Encounter: Admitting: Speech Pathology

## 2024-06-30 ENCOUNTER — Encounter: Admitting: Occupational Therapy

## 2024-06-30 ENCOUNTER — Ambulatory Visit: Admitting: Physical Therapy

## 2024-06-30 DIAGNOSIS — R2681 Unsteadiness on feet: Secondary | ICD-10-CM

## 2024-06-30 DIAGNOSIS — R269 Unspecified abnormalities of gait and mobility: Secondary | ICD-10-CM

## 2024-06-30 DIAGNOSIS — R278 Other lack of coordination: Secondary | ICD-10-CM

## 2024-06-30 DIAGNOSIS — M6281 Muscle weakness (generalized): Secondary | ICD-10-CM

## 2024-06-30 DIAGNOSIS — I69351 Hemiplegia and hemiparesis following cerebral infarction affecting right dominant side: Secondary | ICD-10-CM

## 2024-06-30 NOTE — Therapy (Signed)
 OUTPATIENT PHYSICAL THERAPY TREATMENT  Patient Name: Haley Jones MRN: [id number] DOB:04-10-1956, 67 y.o., female Today's Date: 06/30/2024  PCP: Dr. Oneil Pinal REFERRING PROVIDER: Toribio Pitch, PA-C  END OF SESSION:   PT End of Session - 06/30/24 1531     Visit Number 43    Number of Visits 56    Date for Recertification  08/13/24    Authorization Type [employer]    Progress Note Due on Visit 40    PT Start Time 1405    PT Stop Time 1445    PT Time Calculation (min) 40 min    Equipment Utilized During Treatment Gait belt    Activity Tolerance Patient tolerated treatment well    Behavior During Therapy WFL for tasks assessed/performed          Past Medical History:  Diagnosis Date   Adnexal mass    Anxiety    Chronic back pain    Depression    Diabetes mellitus without complication (HCC)    Endometriosis 03/12/2018   GERD (gastroesophageal reflux disease)    Heart murmur 02/2018   undetected until she was an adult. 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   Weight loss 12/06/2017   Past Surgical History:  Procedure Laterality Date   ABDOMINAL HYSTERECTOMY  2008   ovaries also   CESAREAN SECTION  1994   COLONOSCOPY     COLONOSCOPY WITH PROPOFOL  N/A 02/12/2018   Procedure: COLONOSCOPY WITH PROPOFOL ;  Surgeon: Toledo, Ladell POUR, MD;  Location: [facility];  Service: Gastroenterology;  Laterality: N/A;   ESOPHAGOGASTRODUODENOSCOPY (EGD) WITH PROPOFOL  N/A 02/12/2018   Procedure: ESOPHAGOGASTRODUODENOSCOPY (EGD) WITH PROPOFOL ;  Surgeon: Toledo, Ladell POUR, MD;  Location: [facility];  Service: Gastroenterology;  Laterality: N/A;   EYE SURGERY Left 1973   muscle shortening to straighten cross eyes   LAPAROSCOPY N/A 02/20/2018   Procedure: LAPAROSCOPY DIAGNOSTIC, ( RESECTION OF RIGHT LOWER QUADRANT ABDOMINAL MASS);  Surgeon: Nicholaus Selinda Birmingham, MD;  Location: [facility];   Service: General;  Laterality: N/A;   ROTATOR CUFF REPAIR Left 2011   Patient Active Problem List   Diagnosis Date Noted   Cognitive communication deficit 04/07/2024   Aphasia 04/07/2024   Urinary incontinence as sequela of cerebrovascular accident (CVA) 02/11/2024   Depression with anxiety 01/04/2024   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   Acute CVA (cerebrovascular accident) (HCC) 12/20/2023   Primary hypertension 12/20/2023   Acute kidney injury superimposed on chronic kidney disease 12/20/2023   DM type 2 with diabetic mixed hyperlipidemia (HCC) 11/17/2022   Major depressive disorder, recurrent, 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   B12 deficiency 02/23/2021   SBO (small bowel obstruction) (HCC) 01/29/2019   Barrett's esophagus without dysplasia 04/15/2018   Weight loss 12/06/2017   Hoarse 12/06/2017   Lumbar disc disease 07/26/2017   Diabetes mellitus type 2, controlled, without complications (HCC) 02/09/2015   Surgical menopause 11/26/2014    ONSET DATE: 12/20/2023  REFERRING DIAG: P36.487 (ICD-10-CM) - Left middle cerebral artery stroke (HCC)   THERAPY DIAG:  Muscle weakness (generalized)  Unsteadiness on feet  Hemiplegia and hemiparesis following cerebral infarction affecting right dominant side (HCC)  Abnormality of gait  Other lack of coordination  Rationale for Evaluation and Treatment: Rehabilitation  SUBJECTIVE:  SUBJECTIVE STATEMENT:  Pt reports doing well, no updates or recent falls.    PERTINENT HISTORY:Pt states she is not sure the exact date of when her CVA happened because she kept falling at home trying to care for herself and her daughter, but she finally went to the hospital when her sister insisted on it. Pt states her special needs daughter, Haley Jones, passed away recently  while pt was in the hospital at [facility] before she was transferred to [facility] for stroke rehabilitation.Pt's sister states pt requires supervision/cuing for ADLs  to don clothes correctly, with correct orientation (potential apraxia?) Pt/sister report pt has R side inattention.Pt reports supposedly one of her legs is shorter than the other, believes her R LE might be the shorter one. 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     PAIN:  Are you having pain? No  PRECAUTIONS: Fall RED FLAGS: None  WEIGHT BEARING RESTRICTIONS: No FALLS: Has patient fallen in last 6 months? Yes. Number of falls 6+ before going to hospital and 1 fall since being home from [facility] - pt states she was trying to gather her clothes by herself rather than waiting for her sister to get there to help her  LIVING ENVIRONMENT Lives with: lives alone and but pt's sister, Haley Jones, comes over daily to assist with ADLs and stays to provide supervision all day - pt states at night she would be able to walk to bathroom using RW if needed (but she wears depends in event of incontinence) Lives in: House/apartment Stairs: she has steps, but she doesn't have to use them (house was wheelchair accessible for her daughter)  Has following equipment at home: Environmental [employer]ant - 2 wheeled, shower chair, Grab bars, Ramped entry, and transport chair  PLOF: Independent, Independent with household mobility without device, Independent with homemaking with ambulation, Independent with gait, Independent with transfers, and she was the primary caregiver for her recently deceased daughter   CLOF: Sister provides supervision daily for pt to ambulate using RW safely and provides assist for ADLs (showering and dressing - progressing towards supervision with these activities); Sister states pt requires cues to turn and step back fully prior to sitting to ensure her safety  PATIENT GOALS: get back to being  able to go shopping with improved community level ambulation without the walker; return to driving   OBJECTIVE:  Note: Objective measures were completed at evaluation unless otherwise noted.  LOWER EXTREMITY MMT:    MMT Right Eval Left Eval  Hip flexion 3+, no pain 3+ with some L hip and back pain with this  Knee flexion 3+ 4  Knee extension 4- 4  Ankle dorsiflexion 3+ 4  Ankle plantarflexion 4- 4  (Blank rows = not tested)  TREATMENT DATE: 06/30/2024  TA- To improve functional movements patterns for everyday tasks    Gait with 4WW, 3# AW x 1000 ft, 12 min  Squat to basketball toss on airex 2 x15  - posterior LOB  Fwd hurdle step over onto airex 2 x10 ea LE with single UE support  -VC for lifting LE up and over hurdle for improved foot clearance  Facing laterally, one foot elevated on step, other on airex pad 1x30 sec with horizontal head turns   Unless otherwise stated, CGA was provided and gait belt donned in order to ensure pt safety  Pt required occasional rest breaks due fatigue, PT was attentive to when pt appeared to be tired or winded in order to prevent excessive fatigue.   PATIENT EDUCATION: Education details: Pt educated throughout session about proper posture and technique with exercises. Improved exercise technique, movement at target joints, use of target muscles after min to mod verbal, visual, tactile cues.  Person educated: Patient and Caregiver Haley Jones Education method: Explanation Education comprehension: verbalized understanding and needs further education  HOME EXERCISE PROGRAM: Access Code: [email or web address] URL: [email or web address] Date: 02/04/2024 Prepared by: Peggye Linear  Exercises - Standing March with Counter Support  - 2 x daily - 7 x weekly - 2 sets - 20 reps - Side stepping with counter support: yellow band  loop  - 2 x daily - 7 x weekly - 2 sets - 20 reps - Sit to Stand with Counter Support  - 2 x daily - 7 x weekly - 2 sets - 12 reps   GOALS: Goals reviewed with patient? Yes  SHORT TERM GOALS: Target date: 03/10/2024  Pt will be independent with HEP in order to improve strength and balance in order to decrease fall risk and improve function at home and work.  Baseline: need to initiate7/16: 2-3x week Goal status:  MET  LONG TERM GOALS: Target date: 08/13/2024  1.  Patient will complete five times sit to stand test in < 15 seconds indicating an increased LE strength and improved balance. Baseline:  38.75 seconds, using UE support, 7/16: 20.35 sec, UE support on 1st STS only 8/20:16.86 sec no UE support 9/10:12.16 sec  Goal Status: MET   2.  Patient will increase ABC scale score >50% to demonstrate better functional mobility and better confidence with ADLs.   Baseline: 7.5%, 7/16: 18.125% 8/20: 18.75% 9/10: 16.25% 11/5: 45% Goal status: ONGOING   3.  Patient will increase Berg Balance score by > 6 points to demonstrate decreased fall risk during functional activities. Baseline: need to assess; 01/30/2024= 34/56, 7/16= 39 8/20:42 Goal status: MET   4. Patient will increase 10 meter walk test to >1.49m/s as to improve gait speed for better community ambulation and to reduce fall risk. Baseline: 0.48 m/s using youth RW, requires CGA for safety, 7/16: 0.69 m/s 8/20:  .76 m/s 9/10:.70 m/s 11/05: 11:42 seconds using FWW, .88 m/s Goal status: ONGOING  5. Patient will increase six minute walk test distance to >1000 for progression to community ambulator and improve gait ability Baseline: need to assess; 01/30/2024=410 feet using RW; 02/13/24: 446ft in 26m30s, 7/16: 645ft using 2WW 8/20: 900 ft with RW  9/29: 835 ft with 4WW  11/5: 994 ft with RW Goal status: ONGOING  6.  Patient will improve dynamic gait index score by 4 points or greater to indicate improvement in gait with a rolling walker as  well as decreased risk for falls Baseline:9/17:10 11/5: 14/24 Goal status: MET  7.  Pt will report 70% or greater confidence when ambulating around her home and completing her usual daily activities with LRAD.  Baseline: 30-50%  11/5: 100%  Goal status: MET  8.  Pt will improve mini-best score by 4 points or more to decrease fall risk during functional activity. Baseline: 8 Goal status: INITIAL    ASSESSMENT:  CLINICAL IMPRESSION:   Pt demonstrated great effort this session. AW's were increased for ambulation in order to work toward goal of improved distance with . Other exercises worked on functional balance. Pt challenged most with squatting to pick up balls, and experienced minor posterior LOB when tossing. Pt also demonstrated difficulty with hurdle step overs. VC's to lift LE were helpful in producing improved foot clearance. Horizontal heads turns for scanning the environment were completed in a static stance, which pt was able to maintain for 30 sec without LOB. Pt will continue to benefit from skilled physical therapy intervention to address impairments, improve QOL, and attain therapy goals.   OBJECTIVE IMPAIRMENTS: Abnormal gait, decreased activity tolerance, decreased balance, decreased endurance, decreased knowledge of use of DME, decreased mobility, difficulty walking, decreased strength, decreased safety awareness, impaired vision/preception, and pain.   ACTIVITY LIMITATIONS: carrying, lifting, bending, standing, squatting, stairs, transfers, bed mobility, bathing, toileting, dressing, reach over head, hygiene/grooming, and locomotion level  PARTICIPATION LIMITATIONS: meal prep, cleaning, laundry, driving, shopping, and community activity  PERSONAL FACTORS: Age, Time since onset of injury/illness/exacerbation, and 3+ comorbidities: Hypertension, B12 deficiency, type 2 diabetes, hyperlipidemia, CKD stage IIIa are also affecting patient's functional outcome.   REHAB  POTENTIAL: Good  CLINICAL DECISION MAKING: Evolving/moderate complexity  EVALUATION COMPLEXITY: Moderate  PLAN:  PT FREQUENCY: 1-2x/week  PT DURATION: 8 weeks  PLANNED INTERVENTIONS: 97164- PT Re-evaluation, 97750- Physical Performance Testing, 97110-Therapeutic exercises, 97530- Therapeutic activity, [id number]- Neuromuscular re-education, 97535- Self Care, [id number]- Manual therapy, [phone]- Gait training, [phone]- Orthotic Initial, [phone]- Orthotic/Prosthetic subsequent, [phone]- Canalith repositioning, [phone]- Electrical stimulation (manual), Patient/Family education, Balance training, Stair training, Taping, Joint mobilization, Spinal mobilization, Vestibular training, Visual/preceptual remediation/compensation, DME instructions, Cryotherapy, Moist heat, and Biofeedback  PLAN FOR NEXT SESSION:  Continue working on dynamic gait and balance Gait speed Gait without AD Obstacle navigation   [place], SPT

## 2024-07-02 ENCOUNTER — Ambulatory Visit: Admitting: Physical Therapy

## 2024-07-02 ENCOUNTER — Encounter: Admitting: Occupational Therapy

## 2024-07-02 ENCOUNTER — Encounter: Admitting: Speech Pathology

## 2024-07-02 ENCOUNTER — Ambulatory Visit: Admitting: Occupational Therapy

## 2024-07-02 DIAGNOSIS — R269 Unspecified abnormalities of gait and mobility: Secondary | ICD-10-CM

## 2024-07-02 DIAGNOSIS — M6281 Muscle weakness (generalized): Secondary | ICD-10-CM

## 2024-07-02 DIAGNOSIS — R2681 Unsteadiness on feet: Secondary | ICD-10-CM

## 2024-07-02 DIAGNOSIS — I69351 Hemiplegia and hemiparesis following cerebral infarction affecting right dominant side: Secondary | ICD-10-CM

## 2024-07-02 DIAGNOSIS — R278 Other lack of coordination: Secondary | ICD-10-CM

## 2024-07-02 NOTE — Therapy (Signed)
 OUTPATIENT PHYSICAL THERAPY TREATMENT  Patient Name: Haley Jones MRN: [id number] DOB:Mar 30, 1956, 68 y.o., female Today's Date: 07/02/2024  PCP: Dr. Oneil Pinal REFERRING PROVIDER: Toribio Pitch, PA-C  END OF SESSION:   PT End of Session - 07/02/24 1535     Visit Number 44    Number of Visits 56    Date for Recertification  08/13/24    Authorization Type [employer]    Progress Note Due on Visit 40    PT Start Time 1400    PT Stop Time 1442    PT Time Calculation (min) 42 min    Equipment Utilized During Treatment Gait belt    Activity Tolerance Patient tolerated treatment well    Behavior During Therapy WFL for tasks assessed/performed          Past Medical History:  Diagnosis Date   Adnexal mass    Anxiety    Chronic back pain    Depression    Diabetes mellitus without complication (HCC)    Endometriosis 03/12/2018   GERD (gastroesophageal reflux disease)    Heart murmur 02/2018   undetected until she was an adult. 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   Weight loss 12/06/2017   Past Surgical History:  Procedure Laterality Date   ABDOMINAL HYSTERECTOMY  2008   ovaries also   CESAREAN SECTION  1994   COLONOSCOPY     COLONOSCOPY WITH PROPOFOL  N/A 02/12/2018   Procedure: COLONOSCOPY WITH PROPOFOL ;  Surgeon: Toledo, Ladell POUR, MD;  Location: [facility];  Service: Gastroenterology;  Laterality: N/A;   ESOPHAGOGASTRODUODENOSCOPY (EGD) WITH PROPOFOL  N/A 02/12/2018   Procedure: ESOPHAGOGASTRODUODENOSCOPY (EGD) WITH PROPOFOL ;  Surgeon: Toledo, Ladell POUR, MD;  Location: [facility];  Service: Gastroenterology;  Laterality: N/A;   EYE SURGERY Left 1973   muscle shortening to straighten cross eyes   LAPAROSCOPY N/A 02/20/2018   Procedure: LAPAROSCOPY DIAGNOSTIC, ( RESECTION OF RIGHT LOWER QUADRANT ABDOMINAL MASS);  Surgeon: Nicholaus Selinda Birmingham, MD;  Location: [facility];   Service: General;  Laterality: N/A;   ROTATOR CUFF REPAIR Left 2011   Patient Active Problem List   Diagnosis Date Noted   Cognitive communication deficit 04/07/2024   Aphasia 04/07/2024   Urinary incontinence as sequela of cerebrovascular accident (CVA) 02/11/2024   Depression with anxiety 01/04/2024   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   Acute CVA (cerebrovascular accident) (HCC) 12/20/2023   Primary hypertension 12/20/2023   Acute kidney injury superimposed on chronic kidney disease 12/20/2023   DM type 2 with diabetic mixed hyperlipidemia (HCC) 11/17/2022   Major depressive disorder, recurrent, 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   B12 deficiency 02/23/2021   SBO (small bowel obstruction) (HCC) 01/29/2019   Barrett's esophagus without dysplasia 04/15/2018   Weight loss 12/06/2017   Hoarse 12/06/2017   Lumbar disc disease 07/26/2017   Diabetes mellitus type 2, controlled, without complications (HCC) 02/09/2015   Surgical menopause 11/26/2014    ONSET DATE: 12/20/2023  REFERRING DIAG: P36.487 (ICD-10-CM) - Left middle cerebral artery stroke (HCC)   THERAPY DIAG:  Muscle weakness (generalized)  Unsteadiness on feet  Hemiplegia and hemiparesis following cerebral infarction affecting right dominant side (HCC)  Other lack of coordination  Abnormality of gait  Rationale for Evaluation and Treatment: Rehabilitation  SUBJECTIVE:  SUBJECTIVE STATEMENT:  Pt reports doing well, and no changes since last session  PERTINENT HISTORY:Pt states she is not sure the exact date of when her CVA happened because she kept falling at home trying to care for herself and her daughter, but she finally went to the hospital when her sister insisted on it. Pt states her special needs daughter, Haley Jones, passed away recently  while pt was in the hospital at [facility] before she was transferred to [facility] for stroke rehabilitation.Pt's sister states pt requires supervision/cuing for ADLs  to don clothes correctly, with correct orientation (potential apraxia?) Pt/sister report pt has R side inattention.Pt reports supposedly one of her legs is shorter than the other, believes her R LE might be the shorter one. 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     PAIN:  Are you having pain? No  PRECAUTIONS: Fall RED FLAGS: None  WEIGHT BEARING RESTRICTIONS: No FALLS: Has patient fallen in last 6 months? Yes. Number of falls 6+ before going to hospital and 1 fall since being home from CIR - pt states she was trying to gather her clothes by herself rather than waiting for her sister to get there to help her  LIVING ENVIRONMENT Lives with: lives alone and but pt's sister, [name], comes over daily to assist with ADLs and stays to provide supervision all day - pt states at night she would be able to walk to bathroom using RW if needed (but she wears depends in event of incontinence) Lives in: House/apartment Stairs: she has steps, but she doesn't have to use them (house was wheelchair accessible for her daughter)  Has following equipment at home: [employer] - 2 wheeled, shower chair, Grab bars, Ramped entry, and transport chair  PLOF: Independent, Independent with household mobility without device, Independent with homemaking with ambulation, Independent with gait, Independent with transfers, and she was the primary caregiver for her recently deceased daughter   CLOF: Sister provides supervision daily for pt to ambulate using RW safely and provides assist for ADLs (showering and dressing - progressing towards supervision with these activities); Sister states pt requires cues to turn and step back fully prior to sitting to ensure her safety  PATIENT GOALS: get back to being  able to go shopping with improved community level ambulation without the walker; return to driving   OBJECTIVE:  Note: Objective measures were completed at evaluation unless otherwise noted.  LOWER EXTREMITY MMT:    MMT Right Eval Left Eval  Hip flexion 3+, no pain 3+ with some L hip and back pain with this  Knee flexion 3+ 4  Knee extension 4- 4  Ankle dorsiflexion 3+ 4  Ankle plantarflexion 4- 4  (Blank rows = not tested)  TREATMENT DATE: 07/02/2024  TA- To improve functional movements patterns for everyday tasks    Ambulated across stable and unstable surface outside. Negotiating changing surfaces from sidewalk to gravel, across brick with turns, stairs, ramps, and obstacles in pathway.  - Pt required 2 rest breaks of ~1-2 min during outdoor ambulation.  - 2# AW without AD.  - Total time- 24 min  - Minor LOB at stairs but pt recovered on her own, VC to place whole foot on step  Step ups fwd onto airex with goal of no UE support 2 x 8 ea LE  -Occasional single UE support for bwd step off airex with right LE   - Single UE support for bwd step with left LE  - Breaks between ea LE and sets  Unless otherwise stated, CGA was provided and gait belt donned in order to ensure pt safety  Pt required occasional rest breaks due fatigue, PT was attentive to when pt appeared to be tired or winded in order to prevent excessive fatigue.   PATIENT EDUCATION: Education details: Pt educated throughout session about proper posture and technique with exercises. Improved exercise technique, movement at target joints, use of target muscles after min to mod verbal, visual, tactile cues.  Person educated: Patient and Caregiver [name] Education method: Explanation Education comprehension: verbalized understanding and needs further education  HOME EXERCISE PROGRAM: Access  Code: [email or web address] URL: [email or web address] Date: 02/04/2024 Prepared by: Peggye Linear  Exercises - Standing March with Counter Support  - 2 x daily - 7 x weekly - 2 sets - 20 reps - Side stepping with counter support: yellow band loop  - 2 x daily - 7 x weekly - 2 sets - 20 reps - Sit to Stand with Counter Support  - 2 x daily - 7 x weekly - 2 sets - 12 reps   GOALS: Goals reviewed with patient? Yes  SHORT TERM GOALS: Target date: 03/10/2024  Pt will be independent with HEP in order to improve strength and balance in order to decrease fall risk and improve function at home and work.  Baseline: need to initiate7/16: 2-3x week Goal status:  MET  LONG TERM GOALS: Target date: 08/13/2024  1.  Patient will complete five times sit to stand test in < 15 seconds indicating an increased LE strength and improved balance. Baseline:  38.75 seconds, using UE support, 7/16: 20.35 sec, UE support on 1st STS only 8/20:16.86 sec no UE support 9/10:12.16 sec  Goal Status: MET   2.  Patient will increase ABC scale score >50% to demonstrate better functional mobility and better confidence with ADLs.   Baseline: 7.5%, 7/16: 18.125% 8/20: 18.75% 9/10: 16.25% 11/5: 45% Goal status: ONGOING   3.  Patient will increase Berg Balance score by > 6 points to demonstrate decreased fall risk during functional activities. Baseline: need to assess; 01/30/2024= 34/56, 7/16= 39 8/20:42 Goal status: MET   4. Patient will increase 10 meter walk test to >1.71m/s as to improve gait speed for better community ambulation and to reduce fall risk. Baseline: 0.48 m/s using youth RW, requires CGA for safety, 7/16: 0.69 m/s 8/20:  .76 m/s 9/10:.70 m/s 11/05: 11:42 seconds using FWW, .88 m/s Goal status: ONGOING  5. Patient will increase six minute walk test distance to >1000 for progression to community ambulator and improve gait ability Baseline: need to assess; 01/30/2024=410 feet using RW; 02/13/24: 413ft in  3m30s, 7/16: 670ft using 2WW 8/20: 900 ft with RW  9/29: 835 ft with  5TT  11/5: 994 ft with RW Goal status: ONGOING  6.  Patient will improve dynamic gait index score by 4 points or greater to indicate improvement in gait with a rolling walker as well as decreased risk for falls Baseline:9/17:10 11/5: 14/24 Goal status: MET  7.  Pt will report 70% or greater confidence when ambulating around her home and completing her usual daily activities with LRAD.  Baseline: 30-50%  11/5: 100%  Goal status: MET  8.  Pt will improve mini-best score by 4 points or more to decrease fall risk during functional activity. Baseline: 8 Goal status: INITIAL    ASSESSMENT:  CLINICAL IMPRESSION:   Pt demonstrated great effort this session. Today's main focus was on ambulation throughout the hospital grounds without 4WW, which patient was able to complete with two short rest breaks. Pt navigated obstacles and changes in terrain well. Pt did experience one minor posterior LOB on the stairs but recovered on own. VC to place whole foot on stair was effective in preventing further LOB. After walking, patient required a 3 minute break before moving on. Pt was most challenged with fwd step onto airex with left LE leading. More frequent single UE support was required to maintain balance stepping on and off. Rest breaks were provided between ea LE and between sets due to fatigue from distance walked at start of appt. Pt will continue to benefit from skilled physical therapy intervention to address impairments, improve QOL, and attain therapy goals.   OBJECTIVE IMPAIRMENTS: Abnormal gait, decreased activity tolerance, decreased balance, decreased endurance, decreased knowledge of use of DME, decreased mobility, difficulty walking, decreased strength, decreased safety awareness, impaired vision/preception, and pain.   ACTIVITY LIMITATIONS: carrying, lifting, bending, standing, squatting, stairs, transfers, bed mobility,  bathing, toileting, dressing, reach over head, hygiene/grooming, and locomotion level  PARTICIPATION LIMITATIONS: meal prep, cleaning, laundry, driving, shopping, and community activity  PERSONAL FACTORS: Age, Time since onset of injury/illness/exacerbation, and 3+ comorbidities: Hypertension, B12 deficiency, type 2 diabetes, hyperlipidemia, CKD stage IIIa are also affecting patient's functional outcome.   REHAB POTENTIAL: Good  CLINICAL DECISION MAKING: Evolving/moderate complexity  EVALUATION COMPLEXITY: Moderate  PLAN:  PT FREQUENCY: 1-2x/week  PT DURATION: 8 weeks  PLANNED INTERVENTIONS: 97164- PT Re-evaluation, 97750- Physical Performance Testing, 97110-Therapeutic exercises, 97530- Therapeutic activity, [id number]- Neuromuscular re-education, 97535- Self Care, [id number]- Manual therapy, [phone]- Gait training, [phone]- Orthotic Initial, [phone]- Orthotic/Prosthetic subsequent, [phone]- Canalith repositioning, [phone]- Electrical stimulation (manual), Patient/Family education, Balance training, Stair training, Taping, Joint mobilization, Spinal mobilization, Vestibular training, Visual/preceptual remediation/compensation, DME instructions, Cryotherapy, Moist heat, and Biofeedback  PLAN FOR NEXT SESSION:  Progress below activities: Continue working on dynamic gait and balance Gait speed Gait without AD Obstacle navigation  [place], SPT

## 2024-07-07 ENCOUNTER — Ambulatory Visit: Admitting: Physical Therapy

## 2024-07-07 ENCOUNTER — Encounter: Admitting: Occupational Therapy

## 2024-07-07 ENCOUNTER — Encounter: Admitting: Speech Pathology

## 2024-07-07 DIAGNOSIS — R2681 Unsteadiness on feet: Secondary | ICD-10-CM | POA: Diagnosis not present

## 2024-07-07 DIAGNOSIS — M6281 Muscle weakness (generalized): Secondary | ICD-10-CM

## 2024-07-07 DIAGNOSIS — R269 Unspecified abnormalities of gait and mobility: Secondary | ICD-10-CM

## 2024-07-07 DIAGNOSIS — I69351 Hemiplegia and hemiparesis following cerebral infarction affecting right dominant side: Secondary | ICD-10-CM

## 2024-07-07 DIAGNOSIS — R278 Other lack of coordination: Secondary | ICD-10-CM

## 2024-07-07 NOTE — Therapy (Unsigned)
 OUTPATIENT PHYSICAL THERAPY TREATMENT  Patient Name: Haley Jones MRN: [id number] DOB:[birth year], 68 y.o., female Today's Date: 07/07/2024  PCP: Dr. Oneil Pinal REFERRING PROVIDER: Toribio Pitch, PA-C  END OF SESSION:   PT End of Session - 07/07/24 1537     Visit Number 45    Number of Visits 56    Date for Recertification  08/13/24    Authorization Type [employer]    Progress Note Due on Visit 40    PT Start Time 1404    PT Stop Time 1444    PT Time Calculation (min) 40 min    Equipment Utilized During Treatment Gait belt    Activity Tolerance Patient tolerated treatment well    Behavior During Therapy WFL for tasks assessed/performed          Past Medical History:  Diagnosis Date   Adnexal mass    Anxiety    Chronic back pain    Depression    Diabetes mellitus without complication (HCC)    Endometriosis 03/12/2018   GERD (gastroesophageal reflux disease)    Heart murmur 02/2018   undetected until she was an adult. 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   Weight loss 12/06/2017   Past Surgical History:  Procedure Laterality Date   ABDOMINAL HYSTERECTOMY  2008   ovaries also   CESAREAN SECTION  1994   COLONOSCOPY     COLONOSCOPY WITH PROPOFOL  N/A 02/12/2018   Procedure: COLONOSCOPY WITH PROPOFOL ;  Surgeon: Toledo, Ladell POUR, MD;  Location: [facility];  Service: Gastroenterology;  Laterality: N/A;   ESOPHAGOGASTRODUODENOSCOPY (EGD) WITH PROPOFOL  N/A 02/12/2018   Procedure: ESOPHAGOGASTRODUODENOSCOPY (EGD) WITH PROPOFOL ;  Surgeon: Toledo, Ladell POUR, MD;  Location: [facility];  Service: Gastroenterology;  Laterality: N/A;   EYE SURGERY Left 1973   muscle shortening to straighten cross eyes   LAPAROSCOPY N/A 02/20/2018   Procedure: LAPAROSCOPY DIAGNOSTIC, ( RESECTION OF RIGHT LOWER QUADRANT ABDOMINAL MASS);  Surgeon: Nicholaus Selinda Birmingham, MD;  Location: [facility];   Service: General;  Laterality: N/A;   ROTATOR CUFF REPAIR Left 2011   Patient Active Problem List   Diagnosis Date Noted   Cognitive communication deficit 04/07/2024   Aphasia 04/07/2024   Urinary incontinence as sequela of cerebrovascular accident (CVA) 02/11/2024   Depression with anxiety 01/04/2024   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   Acute CVA (cerebrovascular accident) (HCC) 12/20/2023   Primary hypertension 12/20/2023   Acute kidney injury superimposed on chronic kidney disease 12/20/2023   DM type 2 with diabetic mixed hyperlipidemia (HCC) 11/17/2022   Major depressive disorder, recurrent, 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   B12 deficiency 02/23/2021   SBO (small bowel obstruction) (HCC) 01/29/2019   Barrett's esophagus without dysplasia 04/15/2018   Weight loss 12/06/2017   Hoarse 12/06/2017   Lumbar disc disease 07/26/2017   Diabetes mellitus type 2, controlled, without complications (HCC) 02/09/2015   Surgical menopause 11/26/2014    ONSET DATE: 12/20/2023  REFERRING DIAG: P36.487 (ICD-10-CM) - Left middle cerebral artery stroke (HCC)   THERAPY DIAG:  Muscle weakness (generalized)  Unsteadiness on feet  Hemiplegia and hemiparesis following cerebral infarction affecting right dominant side (HCC)  Abnormality of gait  Other lack of coordination  Rationale for Evaluation and Treatment: Rehabilitation  SUBJECTIVE:  SUBJECTIVE STATEMENT:  Pt reports doing well, and no changes since last session. Mentioned cancelling her Wednesday appt due to brothers surgery, and other family members out of town.   PERTINENT HISTORY:Pt states she is not sure the exact date of when her CVA happened because she kept falling at home trying to care for herself and her daughter, but she finally went to the  hospital when her sister insisted on it. Pt states her special needs daughter, [name], passed away recently while pt was in the hospital at [facility] before she was transferred to [facility] for stroke rehabilitation.Pt's sister states pt requires supervision/cuing for ADLs  to don clothes correctly, with correct orientation (potential apraxia?) Pt/sister report pt has R side inattention.Pt reports supposedly one of her legs is shorter than the other, believes her R LE might be the shorter one. 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     PAIN:  Are you having pain? No  PRECAUTIONS: Fall RED FLAGS: None  WEIGHT BEARING RESTRICTIONS: No FALLS: Has patient fallen in last 6 months? Yes. Number of falls 6+ before going to hospital and 1 fall since being home from CIR - pt states she was trying to gather her clothes by herself rather than waiting for her sister to get there to help her  LIVING ENVIRONMENT Lives with: lives alone and but pt's sister, [name], comes over daily to assist with ADLs and stays to provide supervision all day - pt states at night she would be able to walk to bathroom using RW if needed (but she wears depends in event of incontinence) Lives in: House/apartment Stairs: she has steps, but she doesn't have to use them (house was wheelchair accessible for her daughter)  Has following equipment at home: Environmental Consultant - 2 wheeled, shower chair, Grab bars, Ramped entry, and transport chair  PLOF: Independent, Independent with household mobility without device, Independent with homemaking with ambulation, Independent with gait, Independent with transfers, and she was the primary caregiver for her recently deceased daughter   CLOF: Sister provides supervision daily for pt to ambulate using RW safely and provides assist for ADLs (showering and dressing - progressing towards supervision with these activities); Sister states pt requires  cues to turn and step back fully prior to sitting to ensure her safety  PATIENT GOALS: get back to being able to go shopping with improved community level ambulation without the walker; return to driving   OBJECTIVE:  Note: Objective measures were completed at evaluation unless otherwise noted.  LOWER EXTREMITY MMT:    MMT Right Eval Left Eval  Hip flexion 3+, no pain 3+ with some L hip and back pain with this  Knee flexion 3+ 4  Knee extension 4- 4  Ankle dorsiflexion 3+ 4  Ankle plantarflexion 4- 4  (Blank rows = not tested)  TREATMENT DATE: 07/07/2024  TA- To improve functional movements patterns for everyday tasks    Ambulated across stable and unstable surface outside. Negotiating changing surfaces from sidewalk to grass.   - Pt required 1 rest breaks of ~2.5 min during outdoor ambulation.  - 2.5# AW without AD.  -Pt experienced a few LOB in the grass which she was able to recover on own, but expressed feeling more off balance than usual today - Total time- 20 min   Kickball w/ physioball x 2 min repeated 2 times  -Progressed to side step before kick on second set  Side step along balance beam airex 2 x 10 laps -Intermittent UE support, but encouraged to only reach for bar as needed   Unless otherwise stated, CGA was provided and gait belt donned in order to ensure pt safety  Pt required occasional rest breaks due fatigue, PT was attentive to when pt appeared to be tired or winded in order to prevent excessive fatigue.   PATIENT EDUCATION: Education details: Pt educated throughout session about proper posture and technique with exercises. Improved exercise technique, movement at target joints, use of target muscles after min to mod verbal, visual, tactile cues.  Person educated: Patient and Caregiver [name] Education method:  Explanation Education comprehension: verbalized understanding and needs further education  HOME EXERCISE PROGRAM: Access Code: [email or web address] URL: [email or web address] Date: 02/04/2024 Prepared by: Peggye Linear  Exercises - Standing March with Counter Support  - 2 x daily - 7 x weekly - 2 sets - 20 reps - Side stepping with counter support: yellow band loop  - 2 x daily - 7 x weekly - 2 sets - 20 reps - Sit to Stand with Counter Support  - 2 x daily - 7 x weekly - 2 sets - 12 reps   GOALS: Goals reviewed with patient? Yes  SHORT TERM GOALS: Target date: 03/10/2024  Pt will be independent with HEP in order to improve strength and balance in order to decrease fall risk and improve function at home and work.  Baseline: need to initiate7/16: 2-3x week Goal status:  MET  LONG TERM GOALS: Target date: 08/13/2024  1.  Patient will complete five times sit to stand test in < 15 seconds indicating an increased LE strength and improved balance. Baseline:  38.75 seconds, using UE support, 7/16: 20.35 sec, UE support on 1st STS only 8/20:16.86 sec no UE support 9/10:12.16 sec  Goal Status: MET   2.  Patient will increase ABC scale score >50% to demonstrate better functional mobility and better confidence with ADLs.   Baseline: 7.5%, 7/16: 18.125% 8/20: 18.75% 9/10: 16.25% 11/5: 45% Goal status: ONGOING   3.  Patient will increase Berg Balance score by > 6 points to demonstrate decreased fall risk during functional activities. Baseline: need to assess; 01/30/2024= 34/56, 7/16= 39 8/20:42 Goal status: MET   4. Patient will increase 10 meter walk test to >1.27m/s as to improve gait speed for better community ambulation and to reduce fall risk. Baseline: 0.48 m/s using youth RW, requires CGA for safety, 7/16: 0.69 m/s 8/20:  .76 m/s 9/10:.70 m/s 11/05: 11:42 seconds using FWW, .88 m/s Goal status: ONGOING  5. Patient will increase six minute walk test distance to >1000 for  progression to community ambulator and improve gait ability Baseline: need to assess; 01/30/2024=410 feet using RW; 02/13/24: 428ft in 75m30s, 7/16: 665ft using 2WW 8/20: 900 ft with RW  9/29: 835 ft with 4WW  11/5: 994 ft with RW Goal  status: ONGOING  6.  Patient will improve dynamic gait index score by 4 points or greater to indicate improvement in gait with a rolling walker as well as decreased risk for falls Baseline:9/17:10 11/5: 14/24 Goal status: MET  7.  Pt will report 70% or greater confidence when ambulating around her home and completing her usual daily activities with LRAD.  Baseline: 30-50%  11/5: 100%  Goal status: MET  8.  Pt will improve mini-best score by 4 points or more to decrease fall risk during functional activity. Baseline: 8 Goal status: INITIAL    ASSESSMENT:  CLINICAL IMPRESSION:   Pt demonstrated great effort this session. Today's main focus was on ambulation on uneven surfaces including grass and airex pads. Pt completed ambulation without AD, but demonstrated increased difficulty with navigating uneven terrain. Pt noted she was feeling more off balance than usual. SPT explained to patient that grass will make her feel more off balance due to the uneven surface. Pt also experienced difficulty maintaining balance during lateral steps along airex balance beam. Pt unable to keep foot centered on balance beam which contributed to forward LOB and reaching for bar. Pt would benefit from further activities on uneven surface and will continue to benefit from skilled physical therapy intervention to address impairments, improve QOL, and attain therapy goals.   OBJECTIVE IMPAIRMENTS: Abnormal gait, decreased activity tolerance, decreased balance, decreased endurance, decreased knowledge of use of DME, decreased mobility, difficulty walking, decreased strength, decreased safety awareness, impaired vision/preception, and pain.   ACTIVITY LIMITATIONS: carrying, lifting, bending,  standing, squatting, stairs, transfers, bed mobility, bathing, toileting, dressing, reach over head, hygiene/grooming, and locomotion level  PARTICIPATION LIMITATIONS: meal prep, cleaning, laundry, driving, shopping, and community activity  PERSONAL FACTORS: Age, Time since onset of injury/illness/exacerbation, and 3+ comorbidities: Hypertension, B12 deficiency, type 2 diabetes, hyperlipidemia, CKD stage IIIa are also affecting patient's functional outcome.   REHAB POTENTIAL: Good  CLINICAL DECISION MAKING: Evolving/moderate complexity  EVALUATION COMPLEXITY: Moderate  PLAN:  PT FREQUENCY: 1-2x/week  PT DURATION: 8 weeks  PLANNED INTERVENTIONS: [id number]- PT Re-evaluation, 97750- Physical Performance Testing, 97110-Therapeutic exercises, 97530- Therapeutic activity, [id number]- Neuromuscular re-education, 97535- Self Care, [id number]- Manual therapy, [id number]- Gait training, [phone]- Orthotic Initial, [phone]- Orthotic/Prosthetic subsequent, [phone]- Canalith repositioning, [phone]- Electrical stimulation (manual), Patient/Family education, Balance training, Stair training, Taping, Joint mobilization, Spinal mobilization, Vestibular training, Visual/preceptual remediation/compensation, DME instructions, Cryotherapy, Moist heat, and Biofeedback  PLAN FOR NEXT SESSION:  Progress below activities: Gait speed Gait without AD Obstacle navigation Ambulation on uneven surfaces  Leonor Rode, SPT

## 2024-07-09 ENCOUNTER — Encounter: Admitting: Occupational Therapy

## 2024-07-09 ENCOUNTER — Ambulatory Visit: Admitting: Physical Therapy

## 2024-07-09 ENCOUNTER — Encounter: Admitting: Speech Pathology

## 2024-07-09 ENCOUNTER — Ambulatory Visit: Admitting: Occupational Therapy

## 2024-07-14 ENCOUNTER — Encounter: Admitting: Speech Pathology

## 2024-07-14 ENCOUNTER — Encounter: Admitting: Occupational Therapy

## 2024-07-14 ENCOUNTER — Ambulatory Visit: Attending: Physician Assistant | Admitting: Physical Therapy

## 2024-07-14 DIAGNOSIS — R269 Unspecified abnormalities of gait and mobility: Secondary | ICD-10-CM | POA: Insufficient documentation

## 2024-07-14 DIAGNOSIS — M6281 Muscle weakness (generalized): Secondary | ICD-10-CM | POA: Insufficient documentation

## 2024-07-14 DIAGNOSIS — I69351 Hemiplegia and hemiparesis following cerebral infarction affecting right dominant side: Secondary | ICD-10-CM | POA: Insufficient documentation

## 2024-07-14 DIAGNOSIS — R2681 Unsteadiness on feet: Secondary | ICD-10-CM | POA: Insufficient documentation

## 2024-07-14 DIAGNOSIS — R278 Other lack of coordination: Secondary | ICD-10-CM | POA: Diagnosis present

## 2024-07-14 NOTE — Therapy (Cosign Needed)
 OUTPATIENT PHYSICAL THERAPY TREATMENT  Patient Name: Haley Jones MRN: [id number] DOB:06/14/1956, 68 y.o., female Today's Date: 07/14/2024  PCP: Dr. Oneil Pinal REFERRING PROVIDER: Toribio Pitch, PA-C  END OF SESSION:   PT End of Session - 07/14/24 1449     Visit Number 46    Number of Visits 56    Date for Recertification  08/13/24    Authorization Type [employer]    Progress Note Due on Visit 40    PT Start Time 1400    PT Stop Time 1440    PT Time Calculation (min) 40 min    Equipment Utilized During Treatment Gait belt    Activity Tolerance Patient tolerated treatment well    Behavior During Therapy WFL for tasks assessed/performed          Past Medical History:  Diagnosis Date   Adnexal mass    Anxiety    Chronic back pain    Depression    Diabetes mellitus without complication (HCC)    Endometriosis 03/12/2018   GERD (gastroesophageal reflux disease)    Heart murmur 02/2018   undetected until she was an adult. 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   Weight loss 12/06/2017   Past Surgical History:  Procedure Laterality Date   ABDOMINAL HYSTERECTOMY  2008   ovaries also   CESAREAN SECTION  1994   COLONOSCOPY     COLONOSCOPY WITH PROPOFOL  N/A 02/12/2018   Procedure: COLONOSCOPY WITH PROPOFOL ;  Surgeon: Toledo, Ladell POUR, MD;  Location: [facility];  Service: Gastroenterology;  Laterality: N/A;   ESOPHAGOGASTRODUODENOSCOPY (EGD) WITH PROPOFOL  N/A 02/12/2018   Procedure: ESOPHAGOGASTRODUODENOSCOPY (EGD) WITH PROPOFOL ;  Surgeon: Toledo, Ladell POUR, MD;  Location: [facility];  Service: Gastroenterology;  Laterality: N/A;   EYE SURGERY Left 1973   muscle shortening to straighten cross eyes   LAPAROSCOPY N/A 02/20/2018   Procedure: LAPAROSCOPY DIAGNOSTIC, ( RESECTION OF RIGHT LOWER QUADRANT ABDOMINAL MASS);  Surgeon: Nicholaus Selinda Birmingham, MD;  Location: [facility];   Service: General;  Laterality: N/A;   ROTATOR CUFF REPAIR Left 2011   Patient Active Problem List   Diagnosis Date Noted   Cognitive communication deficit 04/07/2024   Aphasia 04/07/2024   Urinary incontinence as sequela of cerebrovascular accident (CVA) 02/11/2024   Depression with anxiety 01/04/2024   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   Acute CVA (cerebrovascular accident) (HCC) 12/20/2023   Primary hypertension 12/20/2023   Acute kidney injury superimposed on chronic kidney disease 12/20/2023   DM type 2 with diabetic mixed hyperlipidemia (HCC) 11/17/2022   Major depressive disorder, recurrent, 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   B12 deficiency 02/23/2021   SBO (small bowel obstruction) (HCC) 01/29/2019   Barrett's esophagus without dysplasia 04/15/2018   Weight loss 12/06/2017   Hoarse 12/06/2017   Lumbar disc disease 07/26/2017   Diabetes mellitus type 2, controlled, without complications (HCC) 02/09/2015   Surgical menopause 11/26/2014    ONSET DATE: 12/20/2023  REFERRING DIAG: P36.487 (ICD-10-CM) - Left middle cerebral artery stroke (HCC)   THERAPY DIAG:  Muscle weakness (generalized)  Unsteadiness on feet  Hemiplegia and hemiparesis following cerebral infarction affecting right dominant side (HCC)  Abnormality of gait  Other lack of coordination  Rationale for Evaluation and Treatment: Rehabilitation  SUBJECTIVE:  SUBJECTIVE STATEMENT:  Pt says everything is going well, no recent falls or updates. States she hasn't lost her balance recently. Has been using walker pretty much all of the time at home.   PERTINENT HISTORY:Pt states she is not sure the exact date of when her CVA happened because she kept falling at home trying to care for herself and her daughter, but she finally went to  the hospital when her sister insisted on it. Pt states her special needs daughter, Haley Jones, passed away recently while pt was in the hospital at [facility] before she was transferred to [facility] for stroke rehabilitation.Pt's sister states pt requires supervision/cuing for ADLs  to don clothes correctly, with correct orientation (potential apraxia?) Pt/sister report pt has R side inattention.Pt reports supposedly one of her legs is shorter than the other, believes her R LE might be the shorter one. 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     PAIN:  Are you having pain? No  PRECAUTIONS: Fall RED FLAGS: None  WEIGHT BEARING RESTRICTIONS: No FALLS: Has patient fallen in last 6 months? Yes. Number of falls 6+ before going to hospital and 1 fall since being home from CIR - pt states she was trying to gather her clothes by herself rather than waiting for her sister to get there to help her  LIVING ENVIRONMENT Lives with: lives alone and but pt's sister, [name], comes over daily to assist with ADLs and stays to provide supervision all day - pt states at night she would be able to walk to bathroom using RW if needed (but she wears depends in event of incontinence) Lives in: House/apartment Stairs: she has steps, but she doesn't have to use them (house was wheelchair accessible for her daughter)  Has following equipment at home: Environmental Consultant - 2 wheeled, shower chair, Grab bars, Ramped entry, and transport chair  PLOF: Independent, Independent with household mobility without device, Independent with homemaking with ambulation, Independent with gait, Independent with transfers, and she was the primary caregiver for her recently deceased daughter   CLOF: Sister provides supervision daily for pt to ambulate using RW safely and provides assist for ADLs (showering and dressing - progressing towards supervision with these activities); Sister states pt  requires cues to turn and step back fully prior to sitting to ensure her safety  PATIENT GOALS: get back to being able to go shopping with improved community level ambulation without the walker; return to driving   OBJECTIVE:  Note: Objective measures were completed at evaluation unless otherwise noted.  LOWER EXTREMITY MMT:    MMT Right Eval Left Eval  Hip flexion 3+, no pain 3+ with some L hip and back pain with this  Knee flexion 3+ 4  Knee extension 4- 4  Ankle dorsiflexion 3+ 4  Ankle plantarflexion 4- 4  (Blank rows = not tested)  TREATMENT DATE: 07/14/2024  TA- To improve functional movements patterns for everyday tasks    Gait x 1000 ft, total time: 16 min with 2.5# AW and no UE assist, a few LOB throughout corrected with min A or stepping strategy.  -Required one standing and sitting break, stating fatigue from not doing much this past week  Activity Description: Blaze pods set up in a large square, fwd gait to lit up pod requiring rotation to find next lit up pod Activity Setting:  Random Number of Pods:  4 Cycles/Sets:  4 Duration (Time or Hit Count):  1 min  -Last two sets cones placed randomly to act as obstacles  Lateral facing at bar, fwd/bwd step over hurdle 2x10 right LE only  -First set with UE support, second set w/ UE support on retro step  NMR: To facilitate reeducation of movement, balance, posture, coordination, and/or proprioception/kinesthetic sense.   Lateral step up and off airex 2 x 10 ea LE -Intermittent UE support, but encouraged to only reach for bar as needed   Unless otherwise stated, CGA was provided and gait belt donned in order to ensure pt safety  Pt required occasional rest breaks due fatigue, PT was attentive to when pt appeared to be tired or winded in order to prevent excessive fatigue.   PATIENT  EDUCATION: Education details: Pt educated throughout session about proper posture and technique with exercises. Improved exercise technique, movement at target joints, use of target muscles after min to mod verbal, visual, tactile cues.  Person educated: Patient and Caregiver [name] Education method: Explanation Education comprehension: verbalized understanding and needs further education  HOME EXERCISE PROGRAM: Access Code: [email or web address] URL: [email or web address] Date: 02/04/2024 Prepared by: Peggye Linear  Exercises - Standing March with Counter Support  - 2 x daily - 7 x weekly - 2 sets - 20 reps - Side stepping with counter support: yellow band loop  - 2 x daily - 7 x weekly - 2 sets - 20 reps - Sit to Stand with Counter Support  - 2 x daily - 7 x weekly - 2 sets - 12 reps   GOALS: Goals reviewed with patient? Yes  SHORT TERM GOALS: Target date: 03/10/2024  Pt will be independent with HEP in order to improve strength and balance in order to decrease fall risk and improve function at home and work.  Baseline: need to initiate7/16: 2-3x week Goal status:  MET  LONG TERM GOALS: Target date: 08/13/2024  1.  Patient will complete five times sit to stand test in < 15 seconds indicating an increased LE strength and improved balance. Baseline:  38.75 seconds, using UE support, 7/16: 20.35 sec, UE support on 1st STS only 8/20:16.86 sec no UE support 9/10:12.16 sec  Goal Status: MET   2.  Patient will increase ABC scale score >50% to demonstrate better functional mobility and better confidence with ADLs.   Baseline: 7.5%, 7/16: 18.125% 8/20: 18.75% 9/10: 16.25% 11/5: 45% Goal status: ONGOING   3.  Patient will increase Berg Balance score by > 6 points to demonstrate decreased fall risk during functional activities. Baseline: need to assess; 01/30/2024= 34/56, 7/16= 39 8/20:42 Goal status: MET   4. Patient will increase 10 meter walk test to >1.93m/s as to improve gait speed  for better community ambulation and to reduce fall risk. Baseline: 0.48 m/s using youth RW, requires CGA for safety, 7/16: 0.69 m/s 8/20:  .76 m/s 9/10:.70 m/s 11/05: 11:42 seconds using FWW, .88 m/s Goal status: ONGOING  5. Patient will increase six minute walk test distance to >1000 for progression to community ambulator and improve gait ability Baseline: need to assess; 01/30/2024=410 feet using RW; 02/13/24: 417ft in 83m30s, 7/16: 660ft using 2WW 8/20: 900 ft with RW  9/29: 835 ft with 4WW  11/5: 994 ft with RW Goal status: ONGOING  6.  Patient will improve dynamic gait index score by 4 points or greater to indicate improvement in gait with a rolling walker as well as decreased risk for falls Baseline:9/17:10 11/5: 14/24 Goal status: MET  7.  Pt will report 70% or greater confidence when ambulating around her home and completing her usual daily activities with LRAD.  Baseline: 30-50%  11/5: 100%  Goal status: MET  8.  Pt will improve mini-best score by 4 points or more to decrease fall risk during functional activity. Baseline: 8 Goal status: INITIAL    ASSESSMENT:  CLINICAL IMPRESSION:    Pt demonstrated great effort this session. Pt reported fatigue with gait, which required a standing and sitting break. Rest breaks throughout the remainder of the session were also prolonged. Pt was most challenged this session with lateral stepping on and of airex. Goal was to progress from single UE support to no UE support. Pt was very successful in navigating obstacles during blaze pod activity, with no LOB. Pt also demonstrated great success in hurdle step over, and completed final set with only UE support for retro step. Pt was very proud of herself for doing something new and scary. Pt would benefit from further activities on uneven surface and will continue to benefit from skilled physical therapy intervention to address impairments, improve QOL, and attain therapy goals.   OBJECTIVE  IMPAIRMENTS: Abnormal gait, decreased activity tolerance, decreased balance, decreased endurance, decreased knowledge of use of DME, decreased mobility, difficulty walking, decreased strength, decreased safety awareness, impaired vision/preception, and pain.   ACTIVITY LIMITATIONS: carrying, lifting, bending, standing, squatting, stairs, transfers, bed mobility, bathing, toileting, dressing, reach over head, hygiene/grooming, and locomotion level  PARTICIPATION LIMITATIONS: meal prep, cleaning, laundry, driving, shopping, and community activity  PERSONAL FACTORS: Age, Time since onset of injury/illness/exacerbation, and 3+ comorbidities: Hypertension, B12 deficiency, type 2 diabetes, hyperlipidemia, CKD stage IIIa are also affecting patient's functional outcome.   REHAB POTENTIAL: Good  CLINICAL DECISION MAKING: Evolving/moderate complexity  EVALUATION COMPLEXITY: Moderate  PLAN:  PT FREQUENCY: 1-2x/week  PT DURATION: 8 weeks  PLANNED INTERVENTIONS: 97164- PT Re-evaluation, 97750- Physical Performance Testing, 97110-Therapeutic exercises, 97530- Therapeutic activity, [id number]- Neuromuscular re-education, 97535- Self Care, [id number]- Manual therapy, [phone]- Gait training, [phone]- Orthotic Initial, [phone]- Orthotic/Prosthetic subsequent, [phone]- Canalith repositioning, [phone]- Electrical stimulation (manual), Patient/Family education, Balance training, Stair training, Taping, Joint mobilization, Spinal mobilization, Vestibular training, Visual/preceptual remediation/compensation, DME instructions, Cryotherapy, Moist heat, and Biofeedback  PLAN FOR NEXT SESSION:  Progress below activities: Gait speed Gait without AD Obstacle navigation Ambulation on uneven surfaces  Leonor Rode, SPT   This entire session was performed under direct supervision and direction of a licensed estate agent . I have personally read, edited and approve of the note as written.    This licensed clinician  was present and actively directing care throughout the session at all times.  Lonni KATHEE Gainer PT ,DPT Physical Therapist- [place]  [facility]

## 2024-07-16 ENCOUNTER — Ambulatory Visit: Admitting: Physical Therapy

## 2024-07-16 ENCOUNTER — Encounter: Admitting: Occupational Therapy

## 2024-07-16 ENCOUNTER — Encounter: Admitting: Speech Pathology

## 2024-07-16 DIAGNOSIS — R269 Unspecified abnormalities of gait and mobility: Secondary | ICD-10-CM

## 2024-07-16 DIAGNOSIS — R278 Other lack of coordination: Secondary | ICD-10-CM

## 2024-07-16 DIAGNOSIS — M6281 Muscle weakness (generalized): Secondary | ICD-10-CM | POA: Diagnosis not present

## 2024-07-16 DIAGNOSIS — I69351 Hemiplegia and hemiparesis following cerebral infarction affecting right dominant side: Secondary | ICD-10-CM

## 2024-07-16 DIAGNOSIS — R2681 Unsteadiness on feet: Secondary | ICD-10-CM

## 2024-07-16 NOTE — Therapy (Signed)
 OUTPATIENT PHYSICAL THERAPY TREATMENT  Patient Name: Haley Jones MRN: [id number] DOB:1956-02-23, 68 y.o., female Today's Date: 07/16/2024  PCP: Dr. Oneil Pinal REFERRING PROVIDER: Toribio Pitch, PA-C  END OF SESSION:   PT End of Session - 07/16/24 1529     Visit Number 47    Number of Visits 56    Date for Recertification  08/13/24    Authorization Type [employer]    Progress Note Due on Visit 40    PT Start Time 1405    PT Stop Time 1443    PT Time Calculation (min) 38 min    Equipment Utilized During Treatment Gait belt    Activity Tolerance Patient tolerated treatment well    Behavior During Therapy WFL for tasks assessed/performed          Past Medical History:  Diagnosis Date   Adnexal mass    Anxiety    Chronic back pain    Depression    Diabetes mellitus without complication (HCC)    Endometriosis 03/12/2018   GERD (gastroesophageal reflux disease)    Heart murmur 02/2018   undetected until she was an adult. 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   Weight loss 12/06/2017   Past Surgical History:  Procedure Laterality Date   ABDOMINAL HYSTERECTOMY  2008   ovaries also   CESAREAN SECTION  1994   COLONOSCOPY     COLONOSCOPY WITH PROPOFOL  N/A 02/12/2018   Procedure: COLONOSCOPY WITH PROPOFOL ;  Surgeon: Toledo, Ladell POUR, MD;  Location: [facility];  Service: Gastroenterology;  Laterality: N/A;   ESOPHAGOGASTRODUODENOSCOPY (EGD) WITH PROPOFOL  N/A 02/12/2018   Procedure: ESOPHAGOGASTRODUODENOSCOPY (EGD) WITH PROPOFOL ;  Surgeon: Toledo, Ladell POUR, MD;  Location: [facility];  Service: Gastroenterology;  Laterality: N/A;   EYE SURGERY Left 1973   muscle shortening to straighten cross eyes   LAPAROSCOPY N/A 02/20/2018   Procedure: LAPAROSCOPY DIAGNOSTIC, ( RESECTION OF RIGHT LOWER QUADRANT ABDOMINAL MASS);  Surgeon: Nicholaus Selinda Birmingham, MD;  Location: [facility];   Service: General;  Laterality: N/A;   ROTATOR CUFF REPAIR Left 2011   Patient Active Problem List   Diagnosis Date Noted   Cognitive communication deficit 04/07/2024   Aphasia 04/07/2024   Urinary incontinence as sequela of cerebrovascular accident (CVA) 02/11/2024   Depression with anxiety 01/04/2024   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   Acute CVA (cerebrovascular accident) (HCC) 12/20/2023   Primary hypertension 12/20/2023   Acute kidney injury superimposed on chronic kidney disease 12/20/2023   DM type 2 with diabetic mixed hyperlipidemia (HCC) 11/17/2022   Major depressive disorder, recurrent, 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   B12 deficiency 02/23/2021   SBO (small bowel obstruction) (HCC) 01/29/2019   Barrett's esophagus without dysplasia 04/15/2018   Weight loss 12/06/2017   Hoarse 12/06/2017   Lumbar disc disease 07/26/2017   Diabetes mellitus type 2, controlled, without complications (HCC) 02/09/2015   Surgical menopause 11/26/2014    ONSET DATE: 12/20/2023  REFERRING DIAG: P36.487 (ICD-10-CM) - Left middle cerebral artery stroke (HCC)   THERAPY DIAG:  Muscle weakness (generalized)  Unsteadiness on feet  Hemiplegia and hemiparesis following cerebral infarction affecting right dominant side (HCC)  Abnormality of gait  Other lack of coordination  Rationale for Evaluation and Treatment: Rehabilitation  SUBJECTIVE:  SUBJECTIVE STATEMENT:  Pt states she is doing well. No falls or LOB reported.   PERTINENT HISTORY:Pt states she is not sure the exact date of when her CVA happened because she kept falling at home trying to care for herself and her daughter, but she finally went to the hospital when her sister insisted on it. Pt states her special needs daughter, [name], passed away recently  while pt was in the hospital at [facility] before she was transferred to [facility] for stroke rehabilitation.Pt's sister states pt requires supervision/cuing for ADLs  to don clothes correctly, with correct orientation (potential apraxia?) Pt/sister report pt has R side inattention.Pt reports supposedly one of her legs is shorter than the other, believes her R LE might be the shorter one. 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     PAIN:  Are you having pain? No  PRECAUTIONS: Fall RED FLAGS: None  WEIGHT BEARING RESTRICTIONS: No FALLS: Has patient fallen in last 6 months? Yes. Number of falls 6+ before going to hospital and 1 fall since being home from CIR - pt states she was trying to gather her clothes by herself rather than waiting for her sister to get there to help her  LIVING ENVIRONMENT Lives with: lives alone and but pt's sister, [name], comes over daily to assist with ADLs and stays to provide supervision all day - pt states at night she would be able to walk to bathroom using RW if needed (but she wears depends in event of incontinence) Lives in: House/apartment Stairs: she has steps, but she doesn't have to use them (house was wheelchair accessible for her daughter)  Has following equipment at home: Environmental Consultant - 2 wheeled, shower chair, Grab bars, Ramped entry, and transport chair  PLOF: Independent, Independent with household mobility without device, Independent with homemaking with ambulation, Independent with gait, Independent with transfers, and she was the primary caregiver for her recently deceased daughter   CLOF: Sister provides supervision daily for pt to ambulate using RW safely and provides assist for ADLs (showering and dressing - progressing towards supervision with these activities); Sister states pt requires cues to turn and step back fully prior to sitting to ensure her safety  PATIENT GOALS: get back to being  able to go shopping with improved community level ambulation without the walker; return to driving   OBJECTIVE:  Note: Objective measures were completed at evaluation unless otherwise noted.  LOWER EXTREMITY MMT:    MMT Right Eval Left Eval  Hip flexion 3+, no pain 3+ with some L hip and back pain with this  Knee flexion 3+ 4  Knee extension 4- 4  Ankle dorsiflexion 3+ 4  Ankle plantarflexion 4- 4  (Blank rows = not tested)  TREATMENT DATE: 07/16/2024  TA- To improve functional movements patterns for everyday tasks    Gait x 1000 ft, total time: 20 min with 2.5# AW and no AD, dual task naming movies and foods -Pt had difficulty attending to dual task, frequent cueing to continue naming movies/foods  Gait with obstacle navigation and object manipulation: (Distance to basketball hoop ~55ft) -Pick up basketball, turn, weave through cones, and toss basketball x 4 laps -Changed course to alternating toe taps to stepping stones x 5 laps -Final course stepping over 1/2 foam rollers x 6 laps Gait to return to starting position for each obstacle variation  NMR: To facilitate reeducation of movement, balance, posture, coordination, and/or proprioception/kinesthetic sense.   Standing on airex, mini squat to pick up basketball and toss x 15 baskets  -Posterior LOB with initial toss   Unless otherwise stated, CGA was provided and gait belt donned in order to ensure pt safety  Pt required occasional rest breaks due fatigue, PT was attentive to when pt appeared to be tired or winded in order to prevent excessive fatigue.   PATIENT EDUCATION: Education details: Pt educated throughout session about proper posture and technique with exercises. Improved exercise technique, movement at target joints, use of target muscles after min to mod verbal, visual, tactile  cues.  Person educated: Patient and Caregiver [name] Education method: Explanation Education comprehension: verbalized understanding and needs further education  HOME EXERCISE PROGRAM: Access Code: [email or web address] URL: [email or web address] Date: 02/04/2024 Prepared by: Peggye Linear  Exercises - Standing March with Counter Support  - 2 x daily - 7 x weekly - 2 sets - 20 reps - Side stepping with counter support: yellow band loop  - 2 x daily - 7 x weekly - 2 sets - 20 reps - Sit to Stand with Counter Support  - 2 x daily - 7 x weekly - 2 sets - 12 reps   GOALS: Goals reviewed with patient? Yes  SHORT TERM GOALS: Target date: 03/10/2024  Pt will be independent with HEP in order to improve strength and balance in order to decrease fall risk and improve function at home and work.  Baseline: need to initiate7/16: 2-3x week Goal status:  MET  LONG TERM GOALS: Target date: 08/13/2024  1.  Patient will complete five times sit to stand test in < 15 seconds indicating an increased LE strength and improved balance. Baseline:  38.75 seconds, using UE support, 7/16: 20.35 sec, UE support on 1st STS only 8/20:16.86 sec no UE support 9/10:12.16 sec  Goal Status: MET   2.  Patient will increase ABC scale score >50% to demonstrate better functional mobility and better confidence with ADLs.   Baseline: 7.5%, 7/16: 18.125% 8/20: 18.75% 9/10: 16.25% 11/5: 45% Goal status: ONGOING   3.  Patient will increase Berg Balance score by > 6 points to demonstrate decreased fall risk during functional activities. Baseline: need to assess; 01/30/2024= 34/56, 7/16= 39 8/20:42 Goal status: MET   4. Patient will increase 10 meter walk test to >1.42m/s as to improve gait speed for better community ambulation and to reduce fall risk. Baseline: 0.48 m/s using youth RW, requires CGA for safety, 7/16: 0.69 m/s 8/20:  .76 m/s 9/10:.70 m/s 11/05: 11:42 seconds using FWW, .88 m/s Goal status: ONGOING  5.  Patient will increase six minute walk test distance to >1000 for progression to community ambulator and improve gait ability Baseline: need to assess; 01/30/2024=410 feet using RW; 02/13/24: 439ft in 89m30s, 7/16: 657ft using 2WW 8/20:  900 ft with RW  9/29: 835 ft with 4WW  11/5: 994 ft with RW Goal status: ONGOING  6.  Patient will improve dynamic gait index score by 4 points or greater to indicate improvement in gait with a rolling walker as well as decreased risk for falls Baseline:9/17:10 11/5: 14/24 Goal status: MET  7.  Pt will report 70% or greater confidence when ambulating around her home and completing her usual daily activities with LRAD.  Baseline: 30-50%  11/5: 100%  Goal status: MET  8.  Pt will improve mini-best score by 4 points or more to decrease fall risk during functional activity. Baseline: 8 Goal status: INITIAL    ASSESSMENT:  CLINICAL IMPRESSION:    Pt arrived with good motivation for PT activities today. Today's session focused on challenges to gait including dual task and object manipulation/ obstacle navigation. Pt was challenged with dual task activity. SPT offered frequent cueing to return to task. The remainder of the session worked on picking up basketballs and navigating various obstacles. Pt demonstrated difficulty with weaving through cones and demonstrated stepping strategies when balance was lost. Overall, pt demonstrated great endurance with gait activities. Shortened rest breaks were provided for set up of next course. Pt would benefit from further activities on uneven surface and will continue to benefit from skilled physical therapy intervention to address impairments, improve QOL, and attain therapy goals.   OBJECTIVE IMPAIRMENTS: Abnormal gait, decreased activity tolerance, decreased balance, decreased endurance, decreased knowledge of use of DME, decreased mobility, difficulty walking, decreased strength, decreased safety awareness, impaired  vision/preception, and pain.   ACTIVITY LIMITATIONS: carrying, lifting, bending, standing, squatting, stairs, transfers, bed mobility, bathing, toileting, dressing, reach over head, hygiene/grooming, and locomotion level  PARTICIPATION LIMITATIONS: meal prep, cleaning, laundry, driving, shopping, and community activity  PERSONAL FACTORS: Age, Time since onset of injury/illness/exacerbation, and 3+ comorbidities: Hypertension, B12 deficiency, type 2 diabetes, hyperlipidemia, CKD stage IIIa are also affecting patient's functional outcome.   REHAB POTENTIAL: Good  CLINICAL DECISION MAKING: Evolving/moderate complexity  EVALUATION COMPLEXITY: Moderate  PLAN:  PT FREQUENCY: 1-2x/week  PT DURATION: 8 weeks  PLANNED INTERVENTIONS: 97164- PT Re-evaluation, 97750- Physical Performance Testing, 97110-Therapeutic exercises, 97530- Therapeutic activity, [id number]- Neuromuscular re-education, 97535- Self Care, [id number]- Manual therapy, [phone]- Gait training, [phone]- Orthotic Initial, [phone]- Orthotic/Prosthetic subsequent, [phone]- Canalith repositioning, [phone]- Electrical stimulation (manual), Patient/Family education, Balance training, Stair training, Taping, Joint mobilization, Spinal mobilization, Vestibular training, Visual/preceptual remediation/compensation, DME instructions, Cryotherapy, Moist heat, and Biofeedback  PLAN FOR NEXT SESSION:  Progress below activities: Gait speed Gait without AD Obstacle navigation Ambulation on uneven surfaces Stairs  Leonor Rode, SPT   This entire session was performed under direct supervision and direction of a licensed estate agent . I have personally read, edited and approve of the note as written.    This licensed clinician was present and actively directing care throughout the session at all times.  Lonni KATHEE Gainer PT ,DPT Physical Therapist- [place]  [facility]

## 2024-07-21 ENCOUNTER — Ambulatory Visit

## 2024-07-21 ENCOUNTER — Encounter: Admitting: Speech Pathology

## 2024-07-21 ENCOUNTER — Encounter: Admitting: Occupational Therapy

## 2024-07-23 ENCOUNTER — Ambulatory Visit: Admitting: Physical Therapy

## 2024-07-23 ENCOUNTER — Encounter: Admitting: Speech Pathology

## 2024-07-23 ENCOUNTER — Encounter: Admitting: Occupational Therapy

## 2024-07-23 DIAGNOSIS — I69351 Hemiplegia and hemiparesis following cerebral infarction affecting right dominant side: Secondary | ICD-10-CM

## 2024-07-23 DIAGNOSIS — M6281 Muscle weakness (generalized): Secondary | ICD-10-CM

## 2024-07-23 DIAGNOSIS — R278 Other lack of coordination: Secondary | ICD-10-CM

## 2024-07-23 DIAGNOSIS — R269 Unspecified abnormalities of gait and mobility: Secondary | ICD-10-CM

## 2024-07-23 DIAGNOSIS — R2681 Unsteadiness on feet: Secondary | ICD-10-CM

## 2024-07-23 NOTE — Therapy (Signed)
 OUTPATIENT PHYSICAL THERAPY TREATMENT  Patient Name: Haley Jones MRN: [id number] DOB:12/21/1955, 68 y.o., female Today's Date: 07/23/2024  PCP: Dr. Oneil Pinal REFERRING PROVIDER: Toribio Pitch, PA-C  END OF SESSION:   PT End of Session - 07/23/24 1359     Visit Number 48    Number of Visits 56    Date for Recertification  08/13/24    Authorization Type [employer]    Progress Note Due on Visit 40    PT Start Time 0200    PT Stop Time 0240    PT Time Calculation (min) 40 min    Equipment Utilized During Treatment Gait belt    Activity Tolerance Patient tolerated treatment well    Behavior During Therapy WFL for tasks assessed/performed          Past Medical History:  Diagnosis Date   Adnexal mass    Anxiety    Chronic back pain    Depression    Diabetes mellitus without complication (HCC)    Endometriosis 03/12/2018   GERD (gastroesophageal reflux disease)    Heart murmur 02/2018   undetected until she was an adult. 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   Weight loss 12/06/2017   Past Surgical History:  Procedure Laterality Date   ABDOMINAL HYSTERECTOMY  2008   ovaries also   CESAREAN SECTION  1994   COLONOSCOPY     COLONOSCOPY WITH PROPOFOL  N/A 02/12/2018   Procedure: COLONOSCOPY WITH PROPOFOL ;  Surgeon: Toledo, Ladell POUR, MD;  Location: [facility];  Service: Gastroenterology;  Laterality: N/A;   ESOPHAGOGASTRODUODENOSCOPY (EGD) WITH PROPOFOL  N/A 02/12/2018   Procedure: ESOPHAGOGASTRODUODENOSCOPY (EGD) WITH PROPOFOL ;  Surgeon: Toledo, Ladell POUR, MD;  Location: [facility];  Service: Gastroenterology;  Laterality: N/A;   EYE SURGERY Left 1973   muscle shortening to straighten cross eyes   LAPAROSCOPY N/A 02/20/2018   Procedure: LAPAROSCOPY DIAGNOSTIC, ( RESECTION OF RIGHT LOWER QUADRANT ABDOMINAL MASS);  Surgeon: Nicholaus Selinda Birmingham, MD;  Location: [facility];   Service: General;  Laterality: N/A;   ROTATOR CUFF REPAIR Left 2011   Patient Active Problem List   Diagnosis Date Noted   Cognitive communication deficit 04/07/2024   Aphasia 04/07/2024   Urinary incontinence as sequela of cerebrovascular accident (CVA) 02/11/2024   Depression with anxiety 01/04/2024   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   Acute CVA (cerebrovascular accident) (HCC) 12/20/2023   Primary hypertension 12/20/2023   Acute kidney injury superimposed on chronic kidney disease 12/20/2023   DM type 2 with diabetic mixed hyperlipidemia (HCC) 11/17/2022   Major depressive disorder, recurrent, 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   B12 deficiency 02/23/2021   SBO (small bowel obstruction) (HCC) 01/29/2019   Barrett's esophagus without dysplasia 04/15/2018   Weight loss 12/06/2017   Hoarse 12/06/2017   Lumbar disc disease 07/26/2017   Diabetes mellitus type 2, controlled, without complications (HCC) 02/09/2015   Surgical menopause 11/26/2014    ONSET DATE: 12/20/2023  REFERRING DIAG: P36.487 (ICD-10-CM) - Left middle cerebral artery stroke (HCC)   THERAPY DIAG:  Muscle weakness (generalized)  Unsteadiness on feet  Hemiplegia and hemiparesis following cerebral infarction affecting right dominant side (HCC)  Abnormality of gait  Other lack of coordination  Rationale for Evaluation and Treatment: Rehabilitation  SUBJECTIVE:  SUBJECTIVE STATEMENT:  Pt states she is doing well. No falls or LOB reported. Pt states she is beginning to let go of her walker in places it doesn't fit. She is feeling more confident.   PERTINENT HISTORY:Pt states she is not sure the exact date of when her CVA happened because she kept falling at home trying to care for herself and her daughter, but she finally went to the  hospital when her sister insisted on it. Pt states her special needs daughter, Haley Jones, passed away recently while pt was in the hospital at [facility] before she was transferred to [facility] for stroke rehabilitation.Pt's sister states pt requires supervision/cuing for ADLs  to don clothes correctly, with correct orientation (potential apraxia?) Pt/sister report pt has R side inattention.Pt reports supposedly one of her legs is shorter than the other, believes her R LE might be the shorter one. 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     PAIN:  Are you having pain? No  PRECAUTIONS: Fall RED FLAGS: None  WEIGHT BEARING RESTRICTIONS: No FALLS: Has patient fallen in last 6 months? Yes. Number of falls 6+ before going to hospital and 1 fall since being home from CIR - pt states she was trying to gather her clothes by herself rather than waiting for her sister to get there to help her  LIVING ENVIRONMENT Lives with: lives alone and but pt's sister, Haley Jones, comes over daily to assist with ADLs and stays to provide supervision all day - pt states at night she would be able to walk to bathroom using RW if needed (but she wears depends in event of incontinence) Lives in: House/apartment Stairs: she has steps, but she doesn't have to use them (house was wheelchair accessible for her daughter)  Has following equipment at home: Environmental Consultant - 2 wheeled, shower chair, Grab bars, Ramped entry, and transport chair  PLOF: Independent, Independent with household mobility without device, Independent with homemaking with ambulation, Independent with gait, Independent with transfers, and she was the primary caregiver for her recently deceased daughter   CLOF: Sister provides supervision daily for pt to ambulate using RW safely and provides assist for ADLs (showering and dressing - progressing towards supervision with these activities); Sister states pt requires  cues to turn and step back fully prior to sitting to ensure her safety  PATIENT GOALS: get back to being able to go shopping with improved community level ambulation without the walker; return to driving   OBJECTIVE:  Note: Objective measures were completed at evaluation unless otherwise noted.  LOWER EXTREMITY MMT:    MMT Right Eval Left Eval  Hip flexion 3+, no pain 3+ with some L hip and back pain with this  Knee flexion 3+ 4  Knee extension 4- 4  Ankle dorsiflexion 3+ 4  Ankle plantarflexion 4- 4  (Blank rows = not tested)  TREATMENT DATE: 07/23/2024  TA- To improve functional movements patterns for everyday tasks    Stairs: x 54 steps (2 flights) Ascending and descending  -Standing rest for 2 minutes before descending   Activity Description: Side stepping, fwd bwd gait to tap blaze pods with color indicating which foot to tap with Activity Setting:  Random Number of Pods:  5 Cycles/Sets:  Random Duration (Time or Hit Count):  1:30 x 3   -VC to get directly in front of blaze pod before tapping   -Seated rest between sets   NMR: To facilitate reeducation of movement, balance, posture, coordination, and/or proprioception/kinesthetic sense.   Sit to stand performed with feet on airex to basketball toss 2 x 10  -Posterior LOB requiring pt to return to seat  -VC to shift weight anteriorly   Unless otherwise stated, CGA was provided and gait belt donned in order to ensure pt safety  Pt required occasional rest breaks due fatigue, PT was attentive to when pt appeared to be tired or winded in order to prevent excessive fatigue.   PATIENT EDUCATION: Education details: Pt educated throughout session about proper posture and technique with exercises. Improved exercise technique, movement at target joints, use of target muscles after min to mod verbal,  visual, tactile cues.  Person educated: Patient and Caregiver Haley Jones Education method: Explanation Education comprehension: verbalized understanding and needs further education  HOME EXERCISE PROGRAM: Access Code: [email or web address] URL: [email or web address] Date: 02/04/2024 Prepared by: Peggye Linear  Exercises - Standing March with Counter Support  - 2 x daily - 7 x weekly - 2 sets - 20 reps - Side stepping with counter support: yellow band loop  - 2 x daily - 7 x weekly - 2 sets - 20 reps - Sit to Stand with Counter Support  - 2 x daily - 7 x weekly - 2 sets - 12 reps   GOALS: Goals reviewed with patient? Yes  SHORT TERM GOALS: Target date: 03/10/2024  Pt will be independent with HEP in order to improve strength and balance in order to decrease fall risk and improve function at home and work.  Baseline: need to initiate7/16: 2-3x week Goal status:  MET  LONG TERM GOALS: Target date: 08/13/2024  1.  Patient will complete five times sit to stand test in < 15 seconds indicating an increased LE strength and improved balance. Baseline:  38.75 seconds, using UE support, 7/16: 20.35 sec, UE support on 1st STS only 8/20:16.86 sec no UE support 9/10:12.16 sec  Goal Status: MET   2.  Patient will increase ABC scale score >50% to demonstrate better functional mobility and better confidence with ADLs.   Baseline: 7.5%, 7/16: 18.125% 8/20: 18.75% 9/10: 16.25% 11/5: 45% Goal status: ONGOING   3.  Patient will increase Berg Balance score by > 6 points to demonstrate decreased fall risk during functional activities. Baseline: need to assess; 01/30/2024= 34/56, 7/16= 39 8/20:42 Goal status: MET   4. Patient will increase 10 meter walk test to >1.72m/s as to improve gait speed for better community ambulation and to reduce fall risk. Baseline: 0.48 m/s using youth RW, requires CGA for safety, 7/16: 0.69 m/s 8/20:  .76 m/s 9/10:.70 m/s 11/05: 11:42 seconds using FWW, .88 m/s Goal status:  ONGOING  5. Patient will increase six minute walk test distance to >1000 for progression to community ambulator and improve gait ability Baseline: need to assess; 01/30/2024=410 feet using RW; 02/13/24: 453ft in 52m30s, 7/16: 665ft using 2WW 8/20: 900 ft with  RW  9/29: 835 ft with 4WW  11/5: 994 ft with RW Goal status: ONGOING  6.  Patient will improve dynamic gait index score by 4 points or greater to indicate improvement in gait with a rolling walker as well as decreased risk for falls Baseline:9/17:10 11/5: 14/24 Goal status: MET  7.  Pt will report 70% or greater confidence when ambulating around her home and completing her usual daily activities with LRAD.  Baseline: 30-50%  11/5: 100%  Goal status: MET  8.  Pt will improve mini-best score by 4 points or more to decrease fall risk during functional activity. Baseline: 8 Goal status: INITIAL    ASSESSMENT:  CLINICAL IMPRESSION:    Pt arrived with good motivation for PT activities today. Today pt ascended and descended 2 flights of stairs requiring 2 minutes standing rest before descending. Pt had not completed stairs in a few sessions and demonstrated increased endurance by climbing 2nd flight. Pt was challenged this session with completing side stepping, fed/bwd walking activity with dual task. Pt able to repeat instructions from seat but when combined with movement pt had preference for tapping either with the right foot or the foot closest to blazepod. Pt will benefit from further dual task challenges. Balance was challenged at end of session with sit to stand completed on airex pad with basketball toss. Pt had intermittent posterior LOB requiring pt to take a seat in chair. VC to shift weight anteriorly was provided. Pt would benefit from further activities on uneven surface and will continue to benefit from skilled physical therapy intervention to address impairments, improve QOL, and attain therapy goals.   OBJECTIVE IMPAIRMENTS:  Abnormal gait, decreased activity tolerance, decreased balance, decreased endurance, decreased knowledge of use of DME, decreased mobility, difficulty walking, decreased strength, decreased safety awareness, impaired vision/preception, and pain.   ACTIVITY LIMITATIONS: carrying, lifting, bending, standing, squatting, stairs, transfers, bed mobility, bathing, toileting, dressing, reach over head, hygiene/grooming, and locomotion level  PARTICIPATION LIMITATIONS: meal prep, cleaning, laundry, driving, shopping, and community activity  PERSONAL FACTORS: Age, Time since onset of injury/illness/exacerbation, and 3+ comorbidities: Hypertension, B12 deficiency, type 2 diabetes, hyperlipidemia, CKD stage IIIa are also affecting patient's functional outcome.   REHAB POTENTIAL: Good  CLINICAL DECISION MAKING: Evolving/moderate complexity  EVALUATION COMPLEXITY: Moderate  PLAN:  PT FREQUENCY: 1-2x/week  PT DURATION: 8 weeks  PLANNED INTERVENTIONS: 97164- PT Re-evaluation, 97750- Physical Performance Testing, 97110-Therapeutic exercises, 97530- Therapeutic activity, [id number]- Neuromuscular re-education, 97535- Self Care, [id number]- Manual therapy, [phone]- Gait training, [phone]- Orthotic Initial, [phone]- Orthotic/Prosthetic subsequent, [phone]- Canalith repositioning, [phone]- Electrical stimulation (manual), Patient/Family education, Balance training, Stair training, Taping, Joint mobilization, Spinal mobilization, Vestibular training, Visual/preceptual remediation/compensation, DME instructions, Cryotherapy, Moist heat, and Biofeedback  PLAN FOR NEXT SESSION:   Progress below activities: Gait speed Gait without AD Obstacle navigation Ambulation on uneven surfaces Stairs Navigating small spaces  Leonor Rode, SPT   This entire session was performed under direct supervision and direction of a licensed estate agent . I have personally read, edited and approve of the note as written.     This licensed clinician was present and actively directing care throughout the session at all times.  Lonni KATHEE Gainer PT ,DPT Physical Therapist- [place]  [facility]

## 2024-07-25 ENCOUNTER — Ambulatory Visit: Admitting: Physical Therapy

## 2024-07-25 DIAGNOSIS — M6281 Muscle weakness (generalized): Secondary | ICD-10-CM

## 2024-07-25 DIAGNOSIS — I69351 Hemiplegia and hemiparesis following cerebral infarction affecting right dominant side: Secondary | ICD-10-CM

## 2024-07-25 DIAGNOSIS — R269 Unspecified abnormalities of gait and mobility: Secondary | ICD-10-CM

## 2024-07-25 DIAGNOSIS — R278 Other lack of coordination: Secondary | ICD-10-CM

## 2024-07-25 DIAGNOSIS — R2681 Unsteadiness on feet: Secondary | ICD-10-CM

## 2024-07-25 NOTE — Therapy (Signed)
 OUTPATIENT PHYSICAL THERAPY TREATMENT  Patient Name: Haley Jones MRN: [id number] DOB:[birth year], 68 y.o., female Today's Date: 07/25/2024  PCP: Dr. Oneil Pinal REFERRING PROVIDER: Toribio Pitch, PA-C  END OF SESSION:   PT End of Session - 07/25/24 0924     Visit Number 49    Number of Visits 56    Date for Recertification  08/13/24    Authorization Type [employer]    Progress Note Due on Visit 40    PT Start Time 0928    PT Stop Time 1011    PT Time Calculation (min) 43 min    Equipment Utilized During Treatment Gait belt    Activity Tolerance Patient tolerated treatment well    Behavior During Therapy WFL for tasks assessed/performed          Past Medical History:  Diagnosis Date   Adnexal mass    Anxiety    Chronic back pain    Depression    Diabetes mellitus without complication (HCC)    Endometriosis 03/12/2018   GERD (gastroesophageal reflux disease)    Heart murmur 02/2018   undetected until she was an adult. 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   Weight loss 12/06/2017   Past Surgical History:  Procedure Laterality Date   ABDOMINAL HYSTERECTOMY  2008   ovaries also   CESAREAN SECTION  1994   COLONOSCOPY     COLONOSCOPY WITH PROPOFOL  N/A 02/12/2018   Procedure: COLONOSCOPY WITH PROPOFOL ;  Surgeon: Toledo, Ladell POUR, MD;  Location: [facility];  Service: Gastroenterology;  Laterality: N/A;   ESOPHAGOGASTRODUODENOSCOPY (EGD) WITH PROPOFOL  N/A 02/12/2018   Procedure: ESOPHAGOGASTRODUODENOSCOPY (EGD) WITH PROPOFOL ;  Surgeon: Toledo, Ladell POUR, MD;  Location: [facility];  Service: Gastroenterology;  Laterality: N/A;   EYE SURGERY Left 1973   muscle shortening to straighten cross eyes   LAPAROSCOPY N/A 02/20/2018   Procedure: LAPAROSCOPY DIAGNOSTIC, ( RESECTION OF RIGHT LOWER QUADRANT ABDOMINAL MASS);  Surgeon: Nicholaus Selinda Birmingham, MD;  Location: [facility];   Service: General;  Laterality: N/A;   ROTATOR CUFF REPAIR Left 2011   Patient Active Problem List   Diagnosis Date Noted   Cognitive communication deficit 04/07/2024   Aphasia 04/07/2024   Urinary incontinence as sequela of cerebrovascular accident (CVA) 02/11/2024   Depression with anxiety 01/04/2024   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   Acute CVA (cerebrovascular accident) (HCC) 12/20/2023   Primary hypertension 12/20/2023   Acute kidney injury superimposed on chronic kidney disease 12/20/2023   DM type 2 with diabetic mixed hyperlipidemia (HCC) 11/17/2022   Major depressive disorder, recurrent, 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   B12 deficiency 02/23/2021   SBO (small bowel obstruction) (HCC) 01/29/2019   Barrett's esophagus without dysplasia 04/15/2018   Weight loss 12/06/2017   Hoarse 12/06/2017   Lumbar disc disease 07/26/2017   Diabetes mellitus type 2, controlled, without complications (HCC) 02/09/2015   Surgical menopause 11/26/2014    ONSET DATE: 12/20/2023  REFERRING DIAG: P36.487 (ICD-10-CM) - Left middle cerebral artery stroke (HCC)   THERAPY DIAG:  Muscle weakness (generalized)  Hemiplegia and hemiparesis following cerebral infarction affecting right dominant side (HCC)  Unsteadiness on feet  Other lack of coordination  Abnormality of gait  Rationale for Evaluation and Treatment: Rehabilitation  SUBJECTIVE:  SUBJECTIVE STATEMENT:  Pt states she is doing well. No falls or LOB reported. Still completing small activities without walker, planning to leave it outside of the bathroom instead of bringing it with.   PERTINENT HISTORY:Pt states she is not sure the exact date of when her CVA happened because she kept falling at home trying to care for herself and her daughter, but she  finally went to the hospital when her sister insisted on it. Pt states her special needs daughter, [name], passed away recently while pt was in the hospital at [facility] before she was transferred to [facility] for stroke rehabilitation.Pt's sister states pt requires supervision/cuing for ADLs  to don clothes correctly, with correct orientation (potential apraxia?) Pt/sister report pt has R side inattention.Pt reports supposedly one of her legs is shorter than the other, believes her R LE might be the shorter one. 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     PAIN:  Are you having pain? No  PRECAUTIONS: Fall RED FLAGS: None  WEIGHT BEARING RESTRICTIONS: No FALLS: Has patient fallen in last 6 months? Yes. Number of falls 6+ before going to hospital and 1 fall since being home from CIR - pt states she was trying to gather her clothes by herself rather than waiting for her sister to get there to help her  LIVING ENVIRONMENT Lives with: lives alone and but pt's sister, [name], comes over daily to assist with ADLs and stays to provide supervision all day - pt states at night she would be able to walk to bathroom using RW if needed (but she wears depends in event of incontinence) Lives in: House/apartment Stairs: she has steps, but she doesn't have to use them (house was wheelchair accessible for her daughter)  Has following equipment at home: Environmental Consultant - 2 wheeled, shower chair, Grab bars, Ramped entry, and transport chair  PLOF: Independent, Independent with household mobility without device, Independent with homemaking with ambulation, Independent with gait, Independent with transfers, and she was the primary caregiver for her recently deceased daughter   CLOF: Sister provides supervision daily for pt to ambulate using RW safely and provides assist for ADLs (showering and dressing - progressing towards supervision with these activities); Sister  states pt requires cues to turn and step back fully prior to sitting to ensure her safety  PATIENT GOALS: get back to being able to go shopping with improved community level ambulation without the walker; return to driving   OBJECTIVE:  Note: Objective measures were completed at evaluation unless otherwise noted.  LOWER EXTREMITY MMT:    MMT Right Eval Left Eval  Hip flexion 3+, no pain 3+ with some L hip and back pain with this  Knee flexion 3+ 4  Knee extension 4- 4  Ankle dorsiflexion 3+ 4  Ankle plantarflexion 4- 4  (Blank rows = not tested)  TREATMENT DATE: 07/25/2024  TA- To improve functional movements patterns for everyday tasks    4 stair steps ascending and descending holding light ball with single UE support on railing followed by ~150 ft gait x 3   -Progressed to 1000 gram red ball carry x 3   Activity Description: Side stepping to toe tap with color indicating which foot to tap with Activity Setting:  Random Number of Pods:  5 Cycles/Sets:  3 Duration (Time or Hit Count):  1 min 30 sec   -VC/ visual cue to continue facing wall when side stepping  Weaving through cones ~32ft while holding ball x 3 laps   -Short seated break provided to replace cones  -Occasional cone knocked over but no LOB  NMR: To facilitate reeducation of movement, balance, posture, coordination, and/or proprioception/kinesthetic sense.   Front foot elevated on step trainer with back foot on airex holding light ball and tapping to wall 2 x 10 taps ea LE  Unless otherwise stated, CGA was provided and gait belt donned in order to ensure pt safety  Pt required occasional rest breaks due fatigue, PT was attentive to when pt appeared to be tired or winded in order to prevent excessive fatigue.   PATIENT EDUCATION: Education details: Pt educated throughout session about  proper posture and technique with exercises. Improved exercise technique, movement at target joints, use of target muscles after min to mod verbal, visual, tactile cues.  Person educated: Patient and Caregiver [name] Education method: Explanation Education comprehension: verbalized understanding and needs further education  HOME EXERCISE PROGRAM: Access Code: [email or web address] URL: [email or web address] Date: 02/04/2024 Prepared by: Peggye Linear  Exercises - Standing March with Counter Support  - 2 x daily - 7 x weekly - 2 sets - 20 reps - Side stepping with counter support: yellow band loop  - 2 x daily - 7 x weekly - 2 sets - 20 reps - Sit to Stand with Counter Support  - 2 x daily - 7 x weekly - 2 sets - 12 reps   GOALS: Goals reviewed with patient? Yes  SHORT TERM GOALS: Target date: 03/10/2024  Pt will be independent with HEP in order to improve strength and balance in order to decrease fall risk and improve function at home and work.  Baseline: need to initiate7/16: 2-3x week Goal status:  MET  LONG TERM GOALS: Target date: 08/13/2024  1.  Patient will complete five times sit to stand test in < 15 seconds indicating an increased LE strength and improved balance. Baseline:  38.75 seconds, using UE support, 7/16: 20.35 sec, UE support on 1st STS only 8/20:16.86 sec no UE support 9/10:12.16 sec  Goal Status: MET   2.  Patient will increase ABC scale score >50% to demonstrate better functional mobility and better confidence with ADLs.   Baseline: 7.5%, 7/16: 18.125% 8/20: 18.75% 9/10: 16.25% 11/5: 45% Goal status: ONGOING   3.  Patient will increase Berg Balance score by > 6 points to demonstrate decreased fall risk during functional activities. Baseline: need to assess; 01/30/2024= 34/56, 7/16= 39 8/20:42 Goal status: MET   4. Patient will increase 10 meter walk test to >1.61m/s as to improve gait speed for better community ambulation and to reduce fall risk. Baseline:  0.48 m/s using youth RW, requires CGA for safety, 7/16: 0.69 m/s 8/20:  .76 m/s 9/10:.70 m/s 11/05: 11:42 seconds using FWW, .88 m/s Goal status: ONGOING  5. Patient will increase six minute walk test distance to >1000 for progression  to community ambulator and improve gait ability Baseline: need to assess; 01/30/2024=410 feet using RW; 02/13/24: 468ft in 69m30s, 7/16: 655ft using 2WW 8/20: 900 ft with RW  9/29: 835 ft with 4WW  11/5: 994 ft with RW Goal status: ONGOING  6.  Patient will improve dynamic gait index score by 4 points or greater to indicate improvement in gait with a rolling walker as well as decreased risk for falls Baseline:9/17:10 11/5: 14/24 Goal status: MET  7.  Pt will report 70% or greater confidence when ambulating around her home and completing her usual daily activities with LRAD.  Baseline: 30-50%  11/5: 100%  Goal status: MET  8.  Pt will improve mini-best score by 4 points or more to decrease fall risk during functional activity. Baseline: 8 Goal status: INITIAL    ASSESSMENT:  CLINICAL IMPRESSION:    Pt arrived with good motivation for PT activities today. This session included several dual task activities while navigating stairs, cones, and blaze pods. Pt did well holding ball while ascending and descending stairs so progressed to include increased weight. Pt demonstrated reciprocal steps when ascending with a step to pattern descending. Final round of stairs pt was able to demonstrate reciprocal steps on descent with no LOB. Pt continues to be challenged with dual task during blaze pods activities, but at this session demonstrated decreased error with toe taps. VC provided throughout to maintain lateal stepping. Final balance activity required heavy verbal/ visual cuing for set up but once patient understood instruction, she was able to maintain and recover balance on own. Pt would benefit from further activities on uneven surface and will continue to benefit from  skilled physical therapy intervention to address impairments, improve QOL, and attain therapy goals.   OBJECTIVE IMPAIRMENTS: Abnormal gait, decreased activity tolerance, decreased balance, decreased endurance, decreased knowledge of use of DME, decreased mobility, difficulty walking, decreased strength, decreased safety awareness, impaired vision/preception, and pain.   ACTIVITY LIMITATIONS: carrying, lifting, bending, standing, squatting, stairs, transfers, bed mobility, bathing, toileting, dressing, reach over head, hygiene/grooming, and locomotion level  PARTICIPATION LIMITATIONS: meal prep, cleaning, laundry, driving, shopping, and community activity  PERSONAL FACTORS: Age, Time since onset of injury/illness/exacerbation, and 3+ comorbidities: Hypertension, B12 deficiency, type 2 diabetes, hyperlipidemia, CKD stage IIIa are also affecting patient's functional outcome.   REHAB POTENTIAL: Good  CLINICAL DECISION MAKING: Evolving/moderate complexity  EVALUATION COMPLEXITY: Moderate  PLAN:  PT FREQUENCY: 1-2x/week  PT DURATION: 8 weeks  PLANNED INTERVENTIONS: 97164- PT Re-evaluation, 97750- Physical Performance Testing, 97110-Therapeutic exercises, 97530- Therapeutic activity, [id number]- Neuromuscular re-education, 97535- Self Care, [id number]- Manual therapy, [phone]- Gait training, [phone]- Orthotic Initial, [phone]- Orthotic/Prosthetic subsequent, [phone]- Canalith repositioning, [phone]- Electrical stimulation (manual), Patient/Family education, Balance training, Stair training, Taping, Joint mobilization, Spinal mobilization, Vestibular training, Visual/preceptual remediation/compensation, DME instructions, Cryotherapy, Moist heat, and Biofeedback  PLAN FOR NEXT SESSION:   Progress below activities: Gait speed Gait without AD Obstacle navigation Ambulation on uneven surfaces Stairs Navigating small spaces PROGRESS NOTE  Leonor Rode, SPT   This entire session was performed under direct  supervision and direction of a licensed estate agent . I have personally read, edited and approve of the note as written.    This licensed clinician was present and actively directing care throughout the session at all times.  Lonni KATHEE Gainer PT ,DPT Physical Therapist- [place]  [facility]

## 2024-07-28 ENCOUNTER — Encounter: Admitting: Occupational Therapy

## 2024-07-28 ENCOUNTER — Ambulatory Visit: Admitting: Physical Therapy

## 2024-07-28 ENCOUNTER — Encounter: Admitting: Speech Pathology

## 2024-07-28 DIAGNOSIS — M6281 Muscle weakness (generalized): Secondary | ICD-10-CM

## 2024-07-28 DIAGNOSIS — R2681 Unsteadiness on feet: Secondary | ICD-10-CM

## 2024-07-28 DIAGNOSIS — I69351 Hemiplegia and hemiparesis following cerebral infarction affecting right dominant side: Secondary | ICD-10-CM

## 2024-07-28 DIAGNOSIS — R269 Unspecified abnormalities of gait and mobility: Secondary | ICD-10-CM

## 2024-07-28 DIAGNOSIS — R278 Other lack of coordination: Secondary | ICD-10-CM

## 2024-07-28 NOTE — Therapy (Signed)
 OUTPATIENT PHYSICAL THERAPY TREATMENT/ PROGRESS NOTE  Patient Name: Haley Jones MRN: [id number] DOB:May 04, 1956, 68 y.o., female Today's Date: 07/28/2024  Dates of reporting period 06/18/2024  to 07/28/2024   PCP: Dr. Oneil Pinal REFERRING PROVIDER: Toribio Pitch, PA-C  END OF SESSION:   PT End of Session - 07/28/24 1450     Visit Number 50    Number of Visits 56    Date for Recertification  08/13/24    Authorization Type [employer]    Progress Note Due on Visit 60    PT Start Time 1400    PT Stop Time 1442    PT Time Calculation (min) 42 min    Equipment Utilized During Treatment Gait belt    Activity Tolerance Patient tolerated treatment well    Behavior During Therapy WFL for tasks assessed/performed          Past Medical History:  Diagnosis Date   Adnexal mass    Anxiety    Chronic back pain    Depression    Diabetes mellitus without complication (HCC)    Endometriosis 03/12/2018   GERD (gastroesophageal reflux disease)    Heart murmur 02/2018   undetected until she was an adult. 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   Weight loss 12/06/2017   Past Surgical History:  Procedure Laterality Date   ABDOMINAL HYSTERECTOMY  2008   ovaries also   CESAREAN SECTION  1994   COLONOSCOPY     COLONOSCOPY WITH PROPOFOL  N/A 02/12/2018   Procedure: COLONOSCOPY WITH PROPOFOL ;  Surgeon: Toledo, Ladell POUR, MD;  Location: [facility];  Service: Gastroenterology;  Laterality: N/A;   ESOPHAGOGASTRODUODENOSCOPY (EGD) WITH PROPOFOL  N/A 02/12/2018   Procedure: ESOPHAGOGASTRODUODENOSCOPY (EGD) WITH PROPOFOL ;  Surgeon: Toledo, Ladell POUR, MD;  Location: [facility];  Service: Gastroenterology;  Laterality: N/A;   EYE SURGERY Left 1973   muscle shortening to straighten cross eyes   LAPAROSCOPY N/A 02/20/2018   Procedure: LAPAROSCOPY DIAGNOSTIC, ( RESECTION OF RIGHT LOWER QUADRANT  ABDOMINAL MASS);  Surgeon: Nicholaus Selinda Birmingham, MD;  Location: [facility];  Service: General;  Laterality: N/A;   ROTATOR CUFF REPAIR Left 2011   Patient Active Problem List   Diagnosis Date Noted   Cognitive communication deficit 04/07/2024   Aphasia 04/07/2024   Urinary incontinence as sequela of cerebrovascular accident (CVA) 02/11/2024   Depression with anxiety 01/04/2024   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   Acute CVA (cerebrovascular accident) (HCC) 12/20/2023   Primary hypertension 12/20/2023   Acute kidney injury superimposed on chronic kidney disease 12/20/2023   DM type 2 with diabetic mixed hyperlipidemia (HCC) 11/17/2022   Major depressive disorder, recurrent, 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   B12 deficiency 02/23/2021   SBO (small bowel obstruction) (HCC) 01/29/2019   Barrett's esophagus without dysplasia 04/15/2018   Weight loss 12/06/2017   Hoarse 12/06/2017   Lumbar disc disease 07/26/2017   Diabetes mellitus type 2, controlled, without complications (HCC) 02/09/2015   Surgical menopause 11/26/2014    ONSET DATE: 12/20/2023  REFERRING DIAG: P36.487 (ICD-10-CM) - Left middle cerebral artery stroke (HCC)   THERAPY DIAG:  Muscle weakness (generalized)  Hemiplegia and hemiparesis following cerebral infarction affecting right dominant side (HCC)  Unsteadiness on feet  Other lack of coordination  Abnormality of gait  Rationale for Evaluation and Treatment: Rehabilitation  SUBJECTIVE:  SUBJECTIVE STATEMENT:  Pt states she is doing well, Not bad for a Monday. No falls or LOB reported.   PERTINENT HISTORY:Pt states she is not sure the exact date of when her CVA happened because she kept falling at home trying to care for herself and her daughter, but she finally went to the hospital  when her sister insisted on it. Pt states her special needs daughter, Haley Jones, passed away recently while pt was in the hospital at [facility] before she was transferred to [facility] for stroke rehabilitation.Pt's sister states pt requires supervision/cuing for ADLs  to don clothes correctly, with correct orientation (potential apraxia?) Pt/sister report pt has R side inattention.Pt reports supposedly one of her legs is shorter than the other, believes her R LE might be the shorter one. 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     PAIN:  Are you having pain? No  PRECAUTIONS: Fall RED FLAGS: None  WEIGHT BEARING RESTRICTIONS: No FALLS: Has patient fallen in last 6 months? Yes. Number of falls 6+ before going to hospital and 1 fall since being home from CIR - pt states she was trying to gather her clothes by herself rather than waiting for her sister to get there to help her  LIVING ENVIRONMENT Lives with: lives alone and but pt's sister, Haley Jones, comes over daily to assist with ADLs and stays to provide supervision all day - pt states at night she would be able to walk to bathroom using RW if needed (but she wears depends in event of incontinence) Lives in: House/apartment Stairs: she has steps, but she doesn't have to use them (house was wheelchair accessible for her daughter)  Has following equipment at home: Environmental Consultant - 2 wheeled, shower chair, Grab bars, Ramped entry, and transport chair  PLOF: Independent, Independent with household mobility without device, Independent with homemaking with ambulation, Independent with gait, Independent with transfers, and she was the primary caregiver for her recently deceased daughter   CLOF: Sister provides supervision daily for pt to ambulate using RW safely and provides assist for ADLs (showering and dressing - progressing towards supervision with these activities); Sister states pt requires cues to  turn and step back fully prior to sitting to ensure her safety  PATIENT GOALS: get back to being able to go shopping with improved community level ambulation without the walker; return to driving   OBJECTIVE:  Note: Objective measures were completed at evaluation unless otherwise noted.  LOWER EXTREMITY MMT:    MMT Right Eval Left Eval  Hip flexion 3+, no pain 3+ with some L hip and back pain with this  Knee flexion 3+ 4  Knee extension 4- 4  Ankle dorsiflexion 3+ 4  Ankle plantarflexion 4- 4  (Blank rows = not tested)  TREATMENT DATE: 07/28/2024  Physical Performance Test or Measurement: a  physical performance test(s) or measurement (eg,  musculoskeletal, functional capacity), with written report,  each 15 mins    10 Meter Walk Test: Patient instructed to walk 10 meters (32.8 ft) as quickly and as safely as possible at their normal speed Results: 0.84 m/s (11.87 seconds with Rollator) Results with AD: 0.88 m/s (11.30 seconds)  Cut off scores:   Household Ambulator  < 0.4 m/s  Limited Community Ambulator  0.4 - 0.8 m/s  [employer]  > 0.8 m/s  Increased fall risk  < 1.80m/s  Crossing a Street  >1.55m/s  MCID 0.05 m/s (small), 0.13 m/s (moderate), 0.06 m/s (significant)  (ANPTA Core Set of Outcome Measures for Adults with Neurologic Conditions, 2018)   6 Min Walk Test:  Instructed patient to ambulate as quickly and as safely as possible for 6 minutes using LRAD. Patient was allowed to take standing rest breaks without stopping the test, but if the patient required a sitting rest break the clock would be stopped and the test would be over.  Results: 997 feet using a rollator with CGA. Results indicate that the patient has reduced endurance with ambulation compared to age matched norms.  Age Matched Norms (in meters): 41-69 yo M: 24 F: 38,  12-79 yo M: 4 F: 471, 55-89 yo M: 417 F: 392 MDC: 58.21 meters (190.98 feet) or 50 meters (ANPTA Core Set of Outcome Measures for Adults with Neurologic Conditions, 2018)   ABC: 65% ABC scale: The Activities-Specific Balance Confidence (ABC) Scale    No confidence<->completely confident     How confident are you that you will not lose your balance or become unsteady when you . . .       Date tested 15-Dec   1: Walk around the house 90   2. Walk up or down stairs 80   3. Bend over and pick up a slipper from in front of a closet floor 80   4. Reach for a small can off a shelf at eye level 80   5. Stand on tip toes and reach for something above your head 80   6. Stand on a chair and reach for something 20   7. Sweep the floor 70   8. Walk outside the house to a car parked in the driveway 90   9. Get into or out of a car 90   10. Walk across a parking lot to the mall 80   11. Walk up or down a ramp 90   12. Walk in a crowded mall where people rapidly walk past you 70   13. Are bumped into by people as you walk through the mall 70   14. Step onto or off of an escalator while you are holding onto the railing 20   15. Step onto or off an escalator while holding onto parcels such that you cannot hold onto the railing 20   16. Walk outside on icy sidewalks 20   Total: #/16 1050 0.65625    Pt scores 11 / 28 on mini BEST balance test. Scores < 16 indicate increased risk for falls [place], [place], & [place]) and MCID is 4 (Godi,et al, 2013)     Unless otherwise stated, CGA was provided and gait belt donned in order to ensure pt safety  PATIENT EDUCATION: Education details: Pt educated on progress note findings Person educated: Patient Education method: Explanation Education comprehension: verbalized understanding  HOME EXERCISE PROGRAM: Access Code: [email or web address] URL: [email or web address] Date: 02/04/2024 Prepared by: Peggye Linear  Exercises - Standing March with  Counter Support  - 2 x daily - 7 x weekly - 2 sets - 20 reps - Side stepping with counter support: yellow band loop  - 2 x daily - 7 x weekly - 2 sets - 20 reps - Sit to Stand with Counter Support  - 2 x daily - 7 x weekly - 2 sets - 12 reps   GOALS: Goals reviewed with patient? Yes  SHORT TERM GOALS: Target date: 03/10/2024  Pt will be independent with HEP in order to improve strength and balance in order to decrease fall risk and improve function at home and work.  Baseline: need to initiate7/16: 2-3x week Goal status:  MET  LONG TERM GOALS: Target date: 08/13/2024  1.  Patient will complete five times sit to stand test in < 15 seconds indicating an increased LE strength and improved balance. Baseline:  38.75 seconds, using UE support, 7/16: 20.35 sec, UE support on 1st STS only 8/20:16.86 sec no UE support 9/10:12.16 sec  Goal Status: MET   2.  Patient will increase ABC scale score >50% to demonstrate better functional mobility and better confidence with ADLs.   Baseline: 7.5%, 7/16: 18.125% 8/20: 18.75% 9/10: 16.25% 11/5: 45% 12/15: 65% Goal status: ONGOING   3.  Patient will increase Berg Balance score by > 6 points to demonstrate decreased fall risk during functional activities. Baseline: need to assess; 01/30/2024= 34/56, 7/16= 39 8/20:42 Goal status: MET   4. Patient will increase 10 meter walk test to >1.73m/s as to improve gait speed for better community ambulation and to reduce fall risk. Baseline: 0.48 m/s using youth RW, requires CGA for safety, 7/16: 0.69 m/s 8/20:  .76 m/s 9/10:.70 m/s 11/05: 11:42 seconds using FWW, .88 m/s 12/15: with Rollator.0.84 m/s and without AD 0.88 m/s Goal status: ONGOING  5. Patient will increase six minute walk test distance to >1000 for progression to community ambulator and improve gait ability Baseline: need to assess; 01/30/2024=410 feet using RW; 02/13/24: 467ft in 51m30s, 7/16: 675ft using 2WW 8/20: 900 ft with RW  9/29: 835 ft with 4WW   11/5: 994 ft with RW 12/12: 997 ft with Rollator Goal status: ONGOING  6.  Patient will improve dynamic gait index score by 4 points or greater to indicate improvement in gait with a rolling walker as well as decreased risk for falls Baseline:9/17:10 11/5: 14/24 Goal status: MET  7.  Pt will report 70% or greater confidence when ambulating around her home and completing her usual daily activities with LRAD.  Baseline: 30-50%  11/5: 100%  Goal status: MET  8.  Pt will improve mini-best score by 4 points or more to decrease fall risk during functional activity.1 Baseline: 8; 12/15: 11/28 Goal status: ONGOING    ASSESSMENT:  CLINICAL IMPRESSION:    Pt arrived for progress note this session. Pt completed , , ABC scale, and Mini-BESTest. Pt has made great progress in perceived balance confidence as demonstrated by ABC scale. Pts distance walked has also improved and is within 29ft of meeting goal for . Pt completed with and without rollator at this appt and demonstrated increased gait speed without AD. Gait speed remained relatively the same since last progress update, but has not been a main focus of treatment during this progress period. Lastly, pts mini-bestest score increased by 2 points. Pt isn't able to complete reactive postural  control due to fear of falling and pt will benefit from future activities that challenge reactive posture. Patient's condition has the potential to improve in response to therapy. Maximum improvement is yet to be obtained. The anticipated improvement is attainable and reasonable in a generally predictable time.     OBJECTIVE IMPAIRMENTS: Abnormal gait, decreased activity tolerance, decreased balance, decreased endurance, decreased knowledge of use of DME, decreased mobility, difficulty walking, decreased strength, decreased safety awareness, impaired vision/preception, and pain.   ACTIVITY LIMITATIONS: carrying, lifting, bending, standing,  squatting, stairs, transfers, bed mobility, bathing, toileting, dressing, reach over head, hygiene/grooming, and locomotion level  PARTICIPATION LIMITATIONS: meal prep, cleaning, laundry, driving, shopping, and community activity  PERSONAL FACTORS: Age, Time since onset of injury/illness/exacerbation, and 3+ comorbidities: Hypertension, B12 deficiency, type 2 diabetes, hyperlipidemia, CKD stage IIIa are also affecting patient's functional outcome.   REHAB POTENTIAL: Good  CLINICAL DECISION MAKING: Evolving/moderate complexity  EVALUATION COMPLEXITY: Moderate  PLAN:  PT FREQUENCY: 1-2x/week  PT DURATION: 8 weeks  PLANNED INTERVENTIONS: 97164- PT Re-evaluation, 97750- Physical Performance Testing, 97110-Therapeutic exercises, 97530- Therapeutic activity, [id number]- Neuromuscular re-education, 97535- Self Care, [id number]- Manual therapy, [id number]- Gait training, [phone]- Orthotic Initial, [phone]- Orthotic/Prosthetic subsequent, [phone]- Canalith repositioning, [phone]- Electrical stimulation (manual), Patient/Family education, Balance training, Stair training, Taping, Joint mobilization, Spinal mobilization, Vestibular training, Visual/preceptual remediation/compensation, DME instructions, Cryotherapy, Moist heat, and Biofeedback  PLAN FOR NEXT SESSION:   Progress below activities: Components of Mini-BESTest: -Gait speed -Single leg stance -Incline balance EC Obstacle navigation Ambulation on uneven surfaces Stairs Navigating small spaces   Leonor Rode, SPT   This entire session was performed under direct supervision and direction of a licensed estate agent . I have personally read, edited and approve of the note as written.    This licensed clinician was present and actively directing care throughout the session at all times.  Lonni KATHEE Gainer PT ,DPT Physical Therapist- [place]  [facility]

## 2024-07-30 ENCOUNTER — Encounter: Admitting: Occupational Therapy

## 2024-07-30 ENCOUNTER — Ambulatory Visit: Admitting: Physical Therapy

## 2024-07-30 ENCOUNTER — Encounter: Admitting: Speech Pathology

## 2024-07-30 DIAGNOSIS — I69351 Hemiplegia and hemiparesis following cerebral infarction affecting right dominant side: Secondary | ICD-10-CM

## 2024-07-30 DIAGNOSIS — M6281 Muscle weakness (generalized): Secondary | ICD-10-CM | POA: Diagnosis not present

## 2024-07-30 DIAGNOSIS — R278 Other lack of coordination: Secondary | ICD-10-CM

## 2024-07-30 DIAGNOSIS — R2681 Unsteadiness on feet: Secondary | ICD-10-CM

## 2024-07-30 DIAGNOSIS — R269 Unspecified abnormalities of gait and mobility: Secondary | ICD-10-CM

## 2024-07-30 NOTE — Therapy (Signed)
 OUTPATIENT PHYSICAL THERAPY TREATMENT  Patient Name: Haley Jones MRN: [id number] DOB:July 22, 1956, 68 y.o., female Today's Date: 07/30/2024   PCP: Dr. Oneil Pinal REFERRING PROVIDER: Toribio Pitch, PA-C  END OF SESSION:   PT End of Session - 07/30/24 1544     Visit Number 51    Number of Visits 56    Date for Recertification  08/13/24    Authorization Type [employer]    Progress Note Due on Visit 60    PT Start Time 1407    PT Stop Time 1442    PT Time Calculation (min) 35 min    Equipment Utilized During Treatment Gait belt    Activity Tolerance Patient tolerated treatment well    Behavior During Therapy WFL for tasks assessed/performed          Past Medical History:  Diagnosis Date   Adnexal mass    Anxiety    Chronic back pain    Depression    Diabetes mellitus without complication (HCC)    Endometriosis 03/12/2018   GERD (gastroesophageal reflux disease)    Heart murmur 02/2018   undetected until she was an adult. 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   Weight loss 12/06/2017   Past Surgical History:  Procedure Laterality Date   ABDOMINAL HYSTERECTOMY  2008   ovaries also   CESAREAN SECTION  1994   COLONOSCOPY     COLONOSCOPY WITH PROPOFOL  N/A 02/12/2018   Procedure: COLONOSCOPY WITH PROPOFOL ;  Surgeon: Toledo, Ladell POUR, MD;  Location: [facility];  Service: Gastroenterology;  Laterality: N/A;   ESOPHAGOGASTRODUODENOSCOPY (EGD) WITH PROPOFOL  N/A 02/12/2018   Procedure: ESOPHAGOGASTRODUODENOSCOPY (EGD) WITH PROPOFOL ;  Surgeon: Toledo, Ladell POUR, MD;  Location: [facility];  Service: Gastroenterology;  Laterality: N/A;   EYE SURGERY Left 1973   muscle shortening to straighten cross eyes   LAPAROSCOPY N/A 02/20/2018   Procedure: LAPAROSCOPY DIAGNOSTIC, ( RESECTION OF RIGHT LOWER QUADRANT ABDOMINAL MASS);  Surgeon: Nicholaus Selinda Birmingham, MD;  Location: [facility]  [facility];  Service: General;  Laterality: N/A;   ROTATOR CUFF REPAIR Left 2011   Patient Active Problem List   Diagnosis Date Noted   Cognitive communication deficit 04/07/2024   Aphasia 04/07/2024   Urinary incontinence as sequela of cerebrovascular accident (CVA) 02/11/2024   Depression with anxiety 01/04/2024   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   Acute CVA (cerebrovascular accident) (HCC) 12/20/2023   Primary hypertension 12/20/2023   Acute kidney injury superimposed on chronic kidney disease 12/20/2023   DM type 2 with diabetic mixed hyperlipidemia (HCC) 11/17/2022   Major depressive disorder, recurrent, 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   B12 deficiency 02/23/2021   SBO (small bowel obstruction) (HCC) 01/29/2019   Barrett's esophagus without dysplasia 04/15/2018   Weight loss 12/06/2017   Hoarse 12/06/2017   Lumbar disc disease 07/26/2017   Diabetes mellitus type 2, controlled, without complications (HCC) 02/09/2015   Surgical menopause 11/26/2014    ONSET DATE: 12/20/2023  REFERRING DIAG: P36.487 (ICD-10-CM) - Left middle cerebral artery stroke (HCC)   THERAPY DIAG:  Muscle weakness (generalized)  Hemiplegia and hemiparesis following cerebral infarction affecting right dominant side (HCC)  Unsteadiness on feet  Other lack of coordination  Abnormality of gait  Rationale for Evaluation and Treatment: Rehabilitation  SUBJECTIVE:  SUBJECTIVE STATEMENT:  Pt states she is doing well and feeling better about leaving her walker when ambulating through the house.  PERTINENT HISTORY:Pt states she is not sure the exact date of when her CVA happened because she kept falling at home trying to care for herself and her daughter, but she finally went to the hospital when her sister insisted on it. Pt states  her special needs daughter, Haley Jones, passed away recently while pt was in the hospital at [facility] before she was transferred to [facility] for stroke rehabilitation.Pt's sister states pt requires supervision/cuing for ADLs  to don clothes correctly, with correct orientation (potential apraxia?) Pt/sister report pt has R side inattention.Pt reports supposedly one of her legs is shorter than the other, believes her R LE might be the shorter one. 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     PAIN:  Are you having pain? No  PRECAUTIONS: Fall RED FLAGS: None  WEIGHT BEARING RESTRICTIONS: No FALLS: Has patient fallen in last 6 months? Yes. Number of falls 6+ before going to hospital and 1 fall since being home from CIR - pt states she was trying to gather her clothes by herself rather than waiting for her sister to get there to help her  LIVING ENVIRONMENT Lives with: lives alone and but pt's sister, Haley Jones, comes over daily to assist with ADLs and stays to provide supervision all day - pt states at night she would be able to walk to bathroom using RW if needed (but she wears depends in event of incontinence) Lives in: House/apartment Stairs: she has steps, but she doesn't have to use them (house was wheelchair accessible for her daughter)  Has following equipment at home: Environmental Consultant - 2 wheeled, shower chair, Grab bars, Ramped entry, and transport chair  PLOF: Independent, Independent with household mobility without device, Independent with homemaking with ambulation, Independent with gait, Independent with transfers, and she was the primary caregiver for her recently deceased daughter   CLOF: Sister provides supervision daily for pt to ambulate using RW safely and provides assist for ADLs (showering and dressing - progressing towards supervision with these activities); Sister states pt requires cues to turn and step back fully prior to sitting to  ensure her safety  PATIENT GOALS: get back to being able to go shopping with improved community level ambulation without the walker; return to driving   OBJECTIVE:  Note: Objective measures were completed at evaluation unless otherwise noted.  LOWER EXTREMITY MMT:    MMT Right Eval Left Eval  Hip flexion 3+, no pain 3+ with some L hip and back pain with this  Knee flexion 3+ 4  Knee extension 4- 4  Ankle dorsiflexion 3+ 4  Ankle plantarflexion 4- 4  (Blank rows = not tested)  TREATMENT DATE: 07/30/2024  Pt session was cut a little short today due to pt arriving late to scheduled appointment time   TA- To improve functional movements patterns for everyday tasks     4 stair steps ascending and descending holding various objects of different size and weight then navigating through large obstacles ~97ft,  2 x 4  -Progressed second set to include 3# AW  Gait focusing on increasing speed 2 x 12ft  NMR: To facilitate reeducation of movement, balance, posture, coordination, and/or proprioception/kinesthetic sense.   Incline balance with EC 3 x 30 sec  Single leg stance with foot elevated on soccer ball 2 x 30 ea Le  -Standing on right LE required intermittent UE support   Unless otherwise stated, CGA was provided and gait belt donned in order to ensure pt safety  PATIENT EDUCATION: Education details: Pt educated on progress note findings Person educated: Patient Education method: Explanation Education comprehension: verbalized understanding   HOME EXERCISE PROGRAM: Access Code: [email or web address] URL: [email or web address] Date: 02/04/2024 Prepared by: Peggye Linear  Exercises - Standing March with Counter Support  - 2 x daily - 7 x weekly - 2 sets - 20 reps - Side stepping with counter support: yellow band loop  - 2 x daily - 7 x weekly - 2  sets - 20 reps - Sit to Stand with Counter Support  - 2 x daily - 7 x weekly - 2 sets - 12 reps   GOALS: Goals reviewed with patient? Yes  SHORT TERM GOALS: Target date: 03/10/2024  Pt will be independent with HEP in order to improve strength and balance in order to decrease fall risk and improve function at home and work.  Baseline: need to initiate7/16: 2-3x week Goal status:  MET  LONG TERM GOALS: Target date: 08/13/2024  1.  Patient will complete five times sit to stand test in < 15 seconds indicating an increased LE strength and improved balance. Baseline:  38.75 seconds, using UE support, 7/16: 20.35 sec, UE support on 1st STS only 8/20:16.86 sec no UE support 9/10:12.16 sec  Goal Status: MET   2.  Patient will increase ABC scale score >50% to demonstrate better functional mobility and better confidence with ADLs.   Baseline: 7.5%, 7/16: 18.125% 8/20: 18.75% 9/10: 16.25% 11/5: 45% 12/15: 65% Goal status: ONGOING   3.  Patient will increase Berg Balance score by > 6 points to demonstrate decreased fall risk during functional activities. Baseline: need to assess; 01/30/2024= 34/56, 7/16= 39 8/20:42 Goal status: MET   4. Patient will increase 10 meter walk test to >1.54m/s as to improve gait speed for better community ambulation and to reduce fall risk. Baseline: 0.48 m/s using youth RW, requires CGA for safety, 7/16: 0.69 m/s 8/20:  .76 m/s 9/10:.70 m/s 11/05: 11:42 seconds using FWW, .88 m/s 12/15: with Rollator.0.84 m/s and without AD 0.88 m/s Goal status: ONGOING  5. Patient will increase six minute walk test distance to >1000 for progression to community ambulator and improve gait ability Baseline: need to assess; 01/30/2024=410 feet using RW; 02/13/24: 460ft in 51m30s, 7/16: 640ft using 2WW 8/20: 900 ft with RW  9/29: 835 ft with 4WW  11/5: 994 ft with RW 12/12: 997 ft with Rollator Goal status: ONGOING  6.  Patient will improve dynamic gait index score by 4 points or  greater to indicate improvement in gait with a rolling walker as well as decreased risk for falls Baseline:9/17:10 11/5: 14/24 Goal status: MET  7.  Pt  will report 70% or greater confidence when ambulating around her home and completing her usual daily activities with LRAD.  Baseline: 30-50%  11/5: 100%  Goal status: MET  8.  Pt will improve mini-best score by 4 points or more to decrease fall risk during functional activity.1 Baseline: 8; 12/15: 11/28 Goal status: ONGOING    ASSESSMENT:  CLINICAL IMPRESSION:    Pt arrived with good motivation for completion of PT activities today. Per progress note findings, this session worked on various balance challenges, navigating obstacles, and varying gait speed. At progress update pt was unable to balance on incline surface with EC. This session pt was able to maintain for 30 seconds with minimal postural sway. Single leg stance when standing on right LE was more challenging and required intermittent UE support to regain balance. Pt continues to demonstrate improved stair and obstacle navigation and was able to tolerate increased resistance in objects and AW at this session. Pt will continue to benefit from skilled physical therapy intervention to address impairments, improve QOL, and attain therapy goals.    OBJECTIVE IMPAIRMENTS: Abnormal gait, decreased activity tolerance, decreased balance, decreased endurance, decreased knowledge of use of DME, decreased mobility, difficulty walking, decreased strength, decreased safety awareness, impaired vision/preception, and pain.   ACTIVITY LIMITATIONS: carrying, lifting, bending, standing, squatting, stairs, transfers, bed mobility, bathing, toileting, dressing, reach over head, hygiene/grooming, and locomotion level  PARTICIPATION LIMITATIONS: meal prep, cleaning, laundry, driving, shopping, and community activity  PERSONAL FACTORS: Age, Time since onset of injury/illness/exacerbation, and 3+  comorbidities: Hypertension, B12 deficiency, type 2 diabetes, hyperlipidemia, CKD stage IIIa are also affecting patient's functional outcome.   REHAB POTENTIAL: Good  CLINICAL DECISION MAKING: Evolving/moderate complexity  EVALUATION COMPLEXITY: Moderate  PLAN:  PT FREQUENCY: 1-2x/week  PT DURATION: 8 weeks  PLANNED INTERVENTIONS: 97164- PT Re-evaluation, 97750- Physical Performance Testing, 97110-Therapeutic exercises, 97530- Therapeutic activity, [id number]- Neuromuscular re-education, 97535- Self Care, [id number]- Manual therapy, [id number]- Gait training, [phone]- Orthotic Initial, [phone]- Orthotic/Prosthetic subsequent, [phone]- Canalith repositioning, [phone]- Electrical stimulation (manual), Patient/Family education, Balance training, Stair training, Taping, Joint mobilization, Spinal mobilization, Vestibular training, Visual/preceptual remediation/compensation, DME instructions, Cryotherapy, Moist heat, and Biofeedback  PLAN FOR NEXT SESSION:   Progress below activities: Components of Mini-BESTest: -Gait speed, horizontal head turns -Stepping over obstacles -Single leg stance -Balance reactions Obstacle navigation Ambulation on uneven surfaces Stairs Navigating small spaces   Leonor Rode, SPT   This entire session was performed under direct supervision and direction of a licensed estate agent . I have personally read, edited and approve of the note as written.    This licensed clinician was present and actively directing care throughout the session at all times.  Lonni KATHEE Gainer PT ,DPT Physical Therapist- [facility]

## 2024-08-01 NOTE — Therapy (Unsigned)
 " OUTPATIENT PHYSICAL THERAPY TREATMENT  Patient Name: Haley Jones MRN: [id number] DOB:July 01, 1956, 68 y.o., female Today's Date: 08/01/2024   PCP: Dr. Oneil Pinal REFERRING PROVIDER: Toribio Pitch, PA-C  END OF SESSION:     Past Medical History:  Diagnosis Date   Adnexal mass    Anxiety    Chronic back pain    Depression    Diabetes mellitus without complication (HCC)    Endometriosis 03/12/2018   GERD (gastroesophageal reflux disease)    Heart murmur 02/2018   undetected until she was an adult. 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   Weight loss 12/06/2017   Past Surgical History:  Procedure Laterality Date   ABDOMINAL HYSTERECTOMY  2008   ovaries also   CESAREAN SECTION  1994   COLONOSCOPY     COLONOSCOPY WITH PROPOFOL  N/A 02/12/2018   Procedure: COLONOSCOPY WITH PROPOFOL ;  Surgeon: Toledo, Ladell POUR, MD;  Location: [facility];  Service: Gastroenterology;  Laterality: N/A;   ESOPHAGOGASTRODUODENOSCOPY (EGD) WITH PROPOFOL  N/A 02/12/2018   Procedure: ESOPHAGOGASTRODUODENOSCOPY (EGD) WITH PROPOFOL ;  Surgeon: Toledo, Ladell POUR, MD;  Location: [facility];  Service: Gastroenterology;  Laterality: N/A;   EYE SURGERY Left 1973   muscle shortening to straighten cross eyes   LAPAROSCOPY N/A 02/20/2018   Procedure: LAPAROSCOPY DIAGNOSTIC, ( RESECTION OF RIGHT LOWER QUADRANT ABDOMINAL MASS);  Surgeon: Nicholaus Selinda Birmingham, MD;  Location: [facility];  Service: General;  Laterality: N/A;   ROTATOR CUFF REPAIR Left 2011   Patient Active Problem List   Diagnosis Date Noted   Cognitive communication deficit 04/07/2024   Aphasia 04/07/2024   Urinary incontinence as sequela of cerebrovascular accident (CVA) 02/11/2024   Depression with anxiety 01/04/2024   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   Acute CVA (cerebrovascular accident) (HCC) 12/20/2023   Primary hypertension  12/20/2023   Acute kidney injury superimposed on chronic kidney disease 12/20/2023   DM type 2 with diabetic mixed hyperlipidemia (HCC) 11/17/2022   Major depressive disorder, recurrent, 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   B12 deficiency 02/23/2021   SBO (small bowel obstruction) (HCC) 01/29/2019   Barrett's esophagus without dysplasia 04/15/2018   Weight loss 12/06/2017   Hoarse 12/06/2017   Lumbar disc disease 07/26/2017   Diabetes mellitus type 2, controlled, without complications (HCC) 02/09/2015   Surgical menopause 11/26/2014    ONSET DATE: 12/20/2023  REFERRING DIAG: P36.487 (ICD-10-CM) - Left middle cerebral artery stroke (HCC)   THERAPY DIAG:  Muscle weakness (generalized)  Unsteadiness on feet  Abnormality of gait  Hemiplegia and hemiparesis following cerebral infarction affecting right dominant side (HCC)  Other lack of coordination  Rationale for Evaluation and Treatment: Rehabilitation  SUBJECTIVE:  SUBJECTIVE STATEMENT:  Pt states she is doing well and feeling better about leaving her walker when ambulating through the house.  PERTINENT HISTORY:Pt states she is not sure the exact date of when her CVA happened because she kept falling at home trying to care for herself and her daughter, but she finally went to the hospital when her sister insisted on it. Pt states her special needs daughter, Haley Jones, passed away recently while pt was in the hospital at [facility] before she was transferred to [facility] for stroke rehabilitation.Pt's sister states pt requires supervision/cuing for ADLs  to don clothes correctly, with correct orientation (potential apraxia?) Pt/sister report pt has R side inattention.Pt reports supposedly one of her legs is shorter than the other, believes her R LE might be the shorter one.  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     PAIN:  Are you having pain? No  PRECAUTIONS: Fall RED FLAGS: None  WEIGHT BEARING RESTRICTIONS: No FALLS: Has patient fallen in last 6 months? Yes. Number of falls 6+ before going to hospital and 1 fall since being home from CIR - pt states she was trying to gather her clothes by herself rather than waiting for her sister to get there to help her  LIVING ENVIRONMENT Lives with: lives alone and but pt's sister, Haley Jones, comes over daily to assist with ADLs and stays to provide supervision all day - pt states at night she would be able to walk to bathroom using RW if needed (but she wears depends in event of incontinence) Lives in: House/apartment Stairs: she has steps, but she doesn't have to use them (house was wheelchair accessible for her daughter)  Has following equipment at home: [employer] - 2 wheeled, shower chair, Grab bars, Ramped entry, and transport chair  PLOF: Independent, Independent with household mobility without device, Independent with homemaking with ambulation, Independent with gait, Independent with transfers, and she was the primary caregiver for her recently deceased daughter   CLOF: Sister provides supervision daily for pt to ambulate using RW safely and provides assist for ADLs (showering and dressing - progressing towards supervision with these activities); Sister states pt requires cues to turn and step back fully prior to sitting to ensure her safety  PATIENT GOALS: get back to being able to go shopping with improved community level ambulation without the walker; return to driving   OBJECTIVE:  Note: Objective measures were completed at evaluation unless otherwise noted.  LOWER EXTREMITY MMT:    MMT Right Eval Left Eval  Hip flexion 3+, no pain 3+ with some L hip and back pain with this  Knee flexion 3+ 4  Knee extension 4- 4  Ankle dorsiflexion 3+ 4  Ankle  plantarflexion 4- 4  (Blank rows = not tested)  TREATMENT DATE: 08/01/2024  Pt session was cut a little short today due to pt arriving late to scheduled appointment time   TA- To improve functional movements patterns for everyday tasks     4 stair steps ascending and descending holding various objects of different size and weight then navigating through large obstacles ~71ft,  2 x 4  -Progressed second set to include 3# AW  Gait focusing on increasing speed 2 x 131ft  NMR: To facilitate reeducation of movement, balance, posture, coordination, and/or proprioception/kinesthetic sense.   Incline balance with EC 3 x 30 sec  Single leg stance with foot elevated on soccer ball 2 x 30 ea Le  -Standing on right LE required intermittent UE support   Unless otherwise stated, CGA was provided and gait belt donned in order to ensure pt safety  PATIENT EDUCATION: Education details: Pt educated on progress note findings Person educated: Patient Education method: Explanation Education comprehension: verbalized understanding   HOME EXERCISE PROGRAM: Access Code: [email or web address] URL: [email or web address] Date: 02/04/2024 Prepared by: Peggye Linear  Exercises - Standing March with Counter Support  - 2 x daily - 7 x weekly - 2 sets - 20 reps - Side stepping with counter support: yellow band loop  - 2 x daily - 7 x weekly - 2 sets - 20 reps - Sit to Stand with Counter Support  - 2 x daily - 7 x weekly - 2 sets - 12 reps   GOALS: Goals reviewed with patient? Yes  SHORT TERM GOALS: Target date: 03/10/2024  Pt will be independent with HEP in order to improve strength and balance in order to decrease fall risk and improve function at home and work.  Baseline: need to initiate7/16: 2-3x week Goal status:  MET  LONG TERM GOALS: Target date: 08/13/2024  1.   Patient will complete five times sit to stand test in < 15 seconds indicating an increased LE strength and improved balance. Baseline:  38.75 seconds, using UE support, 7/16: 20.35 sec, UE support on 1st STS only 8/20:16.86 sec no UE support 9/10:12.16 sec  Goal Status: MET   2.  Patient will increase ABC scale score >50% to demonstrate better functional mobility and better confidence with ADLs.   Baseline: 7.5%, 7/16: 18.125% 8/20: 18.75% 9/10: 16.25% 11/5: 45% 12/15: 65% Goal status: ONGOING   3.  Patient will increase Berg Balance score by > 6 points to demonstrate decreased fall risk during functional activities. Baseline: need to assess; 01/30/2024= 34/56, 7/16= 39 8/20:42 Goal status: MET   4. Patient will increase 10 meter walk test to >1.96m/s as to improve gait speed for better community ambulation and to reduce fall risk. Baseline: 0.48 m/s using youth RW, requires CGA for safety, 7/16: 0.69 m/s 8/20:  .76 m/s 9/10:.70 m/s 11/05: 11:42 seconds using FWW, .88 m/s 12/15: with Rollator.0.84 m/s and without AD 0.88 m/s Goal status: ONGOING  5. Patient will increase six minute walk test distance to >1000 for progression to community ambulator and improve gait ability Baseline: need to assess; 01/30/2024=410 feet using RW; 02/13/24: 485ft in 67m30s, 7/16: 622ft using 2WW 8/20: 900 ft with RW  9/29: 835 ft with 4WW  11/5: 994 ft with RW 12/12: 997 ft with Rollator Goal status: ONGOING  6.  Patient will improve dynamic gait index score by 4 points or greater to indicate improvement in gait with a rolling walker as well as decreased risk for falls Baseline:9/17:10 11/5: 14/24 Goal status: MET  7.  Pt will  report 70% or greater confidence when ambulating around her home and completing her usual daily activities with LRAD.  Baseline: 30-50%  11/5: 100%  Goal status: MET  8.  Pt will improve mini-best score by 4 points or more to decrease fall risk during functional activity.1 Baseline:  8; 12/15: 11/28 Goal status: ONGOING    ASSESSMENT:  CLINICAL IMPRESSION:    Pt arrived with good motivation for completion of PT activities today. Per progress note findings, this session worked on various balance challenges, navigating obstacles, and varying gait speed. At progress update pt was unable to balance on incline surface with EC. This session pt was able to maintain for 30 seconds with minimal postural sway. Single leg stance when standing on right LE was more challenging and required intermittent UE support to regain balance. Pt continues to demonstrate improved stair and obstacle navigation and was able to tolerate increased resistance in objects and AW at this session. Pt will continue to benefit from skilled physical therapy intervention to address impairments, improve QOL, and attain therapy goals.    OBJECTIVE IMPAIRMENTS: Abnormal gait, decreased activity tolerance, decreased balance, decreased endurance, decreased knowledge of use of DME, decreased mobility, difficulty walking, decreased strength, decreased safety awareness, impaired vision/preception, and pain.   ACTIVITY LIMITATIONS: carrying, lifting, bending, standing, squatting, stairs, transfers, bed mobility, bathing, toileting, dressing, reach over head, hygiene/grooming, and locomotion level  PARTICIPATION LIMITATIONS: meal prep, cleaning, laundry, driving, shopping, and community activity  PERSONAL FACTORS: Age, Time since onset of injury/illness/exacerbation, and 3+ comorbidities: Hypertension, B12 deficiency, type 2 diabetes, hyperlipidemia, CKD stage IIIa are also affecting patient's functional outcome.   REHAB POTENTIAL: Good  CLINICAL DECISION MAKING: Evolving/moderate complexity  EVALUATION COMPLEXITY: Moderate  PLAN:  PT FREQUENCY: 1-2x/week  PT DURATION: 8 weeks  PLANNED INTERVENTIONS: 97164- PT Re-evaluation, 97750- Physical Performance Testing, 97110-Therapeutic exercises, 97530- Therapeutic  activity, [id number]- Neuromuscular re-education, 97535- Self Care, [id number]- Manual therapy, [id number]- Gait training, [phone]- Orthotic Initial, [phone]- Orthotic/Prosthetic subsequent, [phone]- Canalith repositioning, [phone]- Electrical stimulation (manual), Patient/Family education, Balance training, Stair training, Taping, Joint mobilization, Spinal mobilization, Vestibular training, Visual/preceptual remediation/compensation, DME instructions, Cryotherapy, Moist heat, and Biofeedback  PLAN FOR NEXT SESSION:   Progress below activities: Components of Mini-BESTest: -Gait speed, horizontal head turns -Stepping over obstacles -Single leg stance -Balance reactions Obstacle navigation Ambulation on uneven surfaces Stairs Navigating small spaces  Note: Portions of this document were prepared using Dragon voice recognition software and although reviewed may contain unintentional dictation errors in syntax, grammar, or spelling.  Lonni KATHEE Gainer PT ,DPT Physical Therapist- [place]  [facility]    "

## 2024-08-04 ENCOUNTER — Encounter: Admitting: Occupational Therapy

## 2024-08-04 ENCOUNTER — Ambulatory Visit: Admitting: Physical Therapy

## 2024-08-04 ENCOUNTER — Encounter: Admitting: Speech Pathology

## 2024-08-04 DIAGNOSIS — M6281 Muscle weakness (generalized): Secondary | ICD-10-CM | POA: Diagnosis not present

## 2024-08-06 ENCOUNTER — Encounter: Admitting: Occupational Therapy

## 2024-08-06 ENCOUNTER — Encounter

## 2024-08-06 ENCOUNTER — Ambulatory Visit: Admitting: Physical Therapy

## 2024-08-06 ENCOUNTER — Encounter: Admitting: Speech Pathology

## 2024-08-06 DIAGNOSIS — M6281 Muscle weakness (generalized): Secondary | ICD-10-CM

## 2024-08-06 DIAGNOSIS — R269 Unspecified abnormalities of gait and mobility: Secondary | ICD-10-CM

## 2024-08-06 DIAGNOSIS — I69351 Hemiplegia and hemiparesis following cerebral infarction affecting right dominant side: Secondary | ICD-10-CM

## 2024-08-06 DIAGNOSIS — R2681 Unsteadiness on feet: Secondary | ICD-10-CM

## 2024-08-06 NOTE — Therapy (Signed)
 " OUTPATIENT PHYSICAL THERAPY TREATMENT  Patient Name: Haley Jones MRN: [id number] DOB:07/24/1956, 68 y.o., female Today's Date: 08/06/2024   PCP: Dr. Oneil Pinal REFERRING PROVIDER: Toribio Pitch, PA-C  END OF SESSION:   PT End of Session - 08/06/24 1055     Visit Number 53    Number of Visits 56    Date for Recertification  08/13/24    Authorization Type [employer]    Progress Note Due on Visit 60    PT Start Time 1045    PT Stop Time 1125    PT Time Calculation (min) 40 min    Equipment Utilized During Treatment Gait belt    Activity Tolerance Patient tolerated treatment well    Behavior During Therapy WFL for tasks assessed/performed            Past Medical History:  Diagnosis Date   Adnexal mass    Anxiety    Chronic back pain    Depression    Diabetes mellitus without complication (HCC)    Endometriosis 03/12/2018   GERD (gastroesophageal reflux disease)    Heart murmur 02/2018   undetected until she was an adult. 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   Weight loss 12/06/2017   Past Surgical History:  Procedure Laterality Date   ABDOMINAL HYSTERECTOMY  2008   ovaries also   CESAREAN SECTION  1994   COLONOSCOPY     COLONOSCOPY WITH PROPOFOL  N/A 02/12/2018   Procedure: COLONOSCOPY WITH PROPOFOL ;  Surgeon: Toledo, Ladell POUR, MD;  Location: [facility];  Service: Gastroenterology;  Laterality: N/A;   ESOPHAGOGASTRODUODENOSCOPY (EGD) WITH PROPOFOL  N/A 02/12/2018   Procedure: ESOPHAGOGASTRODUODENOSCOPY (EGD) WITH PROPOFOL ;  Surgeon: Toledo, Ladell POUR, MD;  Location: [facility];  Service: Gastroenterology;  Laterality: N/A;   EYE SURGERY Left 1973   muscle shortening to straighten cross eyes   LAPAROSCOPY N/A 02/20/2018   Procedure: LAPAROSCOPY DIAGNOSTIC, ( RESECTION OF RIGHT LOWER QUADRANT ABDOMINAL MASS);  Surgeon: Nicholaus Selinda Birmingham, MD;  Location: [facility]  ORS;  Service: General;  Laterality: N/A;   ROTATOR CUFF REPAIR Left 2011   Patient Active Problem List   Diagnosis Date Noted   Cognitive communication deficit 04/07/2024   Aphasia 04/07/2024   Urinary incontinence as sequela of cerebrovascular accident (CVA) 02/11/2024   Depression with anxiety 01/04/2024   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   Acute CVA (cerebrovascular accident) (HCC) 12/20/2023   Primary hypertension 12/20/2023   Acute kidney injury superimposed on chronic kidney disease 12/20/2023   DM type 2 with diabetic mixed hyperlipidemia (HCC) 11/17/2022   Major depressive disorder, recurrent, 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   B12 deficiency 02/23/2021   SBO (small bowel obstruction) (HCC) 01/29/2019   Barrett's esophagus without dysplasia 04/15/2018   Weight loss 12/06/2017   Hoarse 12/06/2017   Lumbar disc disease 07/26/2017   Diabetes mellitus type 2, controlled, without complications (HCC) 02/09/2015   Surgical menopause 11/26/2014    ONSET DATE: 12/20/2023  REFERRING DIAG: P36.487 (ICD-10-CM) - Left middle cerebral artery stroke (HCC)   THERAPY DIAG:  Muscle weakness (generalized)  Unsteadiness on feet  Abnormality of gait  Hemiplegia and hemiparesis following cerebral infarction affecting right dominant side (HCC)  Rationale for Evaluation and Treatment: Rehabilitation  SUBJECTIVE:  SUBJECTIVE STATEMENT:  Pt states she is doing well and feeling better about leaving her walker when ambulating through the house.No changes since last session.   PERTINENT HISTORY:Pt states she is not sure the exact date of when her CVA happened because she kept falling at home trying to care for herself and her daughter, but she finally went to the hospital when her sister insisted on it. Pt states  her special needs daughter, Haley Jones, passed away recently while pt was in the hospital at [facility] before she was transferred to [facility] for stroke rehabilitation.Pt's sister states pt requires supervision/cuing for ADLs  to don clothes correctly, with correct orientation (potential apraxia?) Pt/sister report pt has R side inattention.Pt reports supposedly one of her legs is shorter than the other, believes her R LE might be the shorter one. 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     PAIN:  Are you having pain? No  PRECAUTIONS: Fall RED FLAGS: None  WEIGHT BEARING RESTRICTIONS: No FALLS: Has patient fallen in last 6 months? Yes. Number of falls 6+ before going to hospital and 1 fall since being home from CIR - pt states she was trying to gather her clothes by herself rather than waiting for her sister to get there to help her  LIVING ENVIRONMENT Lives with: lives alone and but pt's sister, Haley Jones, comes over daily to assist with ADLs and stays to provide supervision all day - pt states at night she would be able to walk to bathroom using RW if needed (but she wears depends in event of incontinence) Lives in: House/apartment Stairs: she has steps, but she doesn't have to use them (house was wheelchair accessible for her daughter)  Has following equipment at home: [employer] - 2 wheeled, shower chair, Grab bars, Ramped entry, and transport chair  PLOF: Independent, Independent with household mobility without device, Independent with homemaking with ambulation, Independent with gait, Independent with transfers, and she was the primary caregiver for her recently deceased daughter   CLOF: Sister provides supervision daily for pt to ambulate using RW safely and provides assist for ADLs (showering and dressing - progressing towards supervision with these activities); Sister states pt requires cues to turn and step back fully prior to sitting to  ensure her safety  PATIENT GOALS: get back to being able to go shopping with improved community level ambulation without the walker; return to driving   OBJECTIVE:  Note: Objective measures were completed at evaluation unless otherwise noted.  LOWER EXTREMITY MMT:    MMT Right Eval Left Eval  Hip flexion 3+, no pain 3+ with some L hip and back pain with this  Knee flexion 3+ 4  Knee extension 4- 4  Ankle dorsiflexion 3+ 4  Ankle plantarflexion 4- 4  (Blank rows = not tested)  TREATMENT DATE: 08/06/2024  Pt session was cut a little short today due to pt arriving late to scheduled appointment time   TA- To improve functional movements patterns for everyday tasks    Gait focusing on increasing speed 3 x 3:20 min with 2# AW ( 620 ft) with 2-3 min rest between   obstacle navigation step over challenge 3 x 2 laps with several hurdles and 1/2 foam rollers - challenged to change wich foot stepping over  BP: 132/91 post session   Unless otherwise stated, CGA was provided and gait belt donned in order to ensure pt safety  PATIENT EDUCATION: Education details: Pt educated on progress note findings Person educated: Patient Education method: Explanation Education comprehension: verbalized understanding   HOME EXERCISE PROGRAM: Access Code: [email or web address] URL: [email or web address] Date: 02/04/2024 Prepared by: Peggye Linear  Exercises - Standing March with Counter Support  - 2 x daily - 7 x weekly - 2 sets - 20 reps - Side stepping with counter support: yellow band loop  - 2 x daily - 7 x weekly - 2 sets - 20 reps - Sit to Stand with Counter Support  - 2 x daily - 7 x weekly - 2 sets - 12 reps   GOALS: Goals reviewed with patient? Yes  SHORT TERM GOALS: Target date: 03/10/2024  Pt will be independent with HEP in order to improve strength and  balance in order to decrease fall risk and improve function at home and work.  Baseline: need to initiate7/16: 2-3x week Goal status:  MET  LONG TERM GOALS: Target date: 08/13/2024  1.  Patient will complete five times sit to stand test in < 15 seconds indicating an increased LE strength and improved balance. Baseline:  38.75 seconds, using UE support, 7/16: 20.35 sec, UE support on 1st STS only 8/20:16.86 sec no UE support 9/10:12.16 sec  Goal Status: MET   2.  Patient will increase ABC scale score >50% to demonstrate better functional mobility and better confidence with ADLs.   Baseline: 7.5%, 7/16: 18.125% 8/20: 18.75% 9/10: 16.25% 11/5: 45% 12/15: 65% Goal status: ONGOING   3.  Patient will increase Berg Balance score by > 6 points to demonstrate decreased fall risk during functional activities. Baseline: need to assess; 01/30/2024= 34/56, 7/16= 39 8/20:42 Goal status: MET   4. Patient will increase 10 meter walk test to >1.35m/s as to improve gait speed for better community ambulation and to reduce fall risk. Baseline: 0.48 m/s using youth RW, requires CGA for safety, 7/16: 0.69 m/s 8/20:  .76 m/s 9/10:.70 m/s 11/05: 11:42 seconds using FWW, .88 m/s 12/15: with Rollator.0.84 m/s and without AD 0.88 m/s Goal status: ONGOING  5. Patient will increase six minute walk test distance to >1000 for progression to community ambulator and improve gait ability Baseline: need to assess; 01/30/2024=410 feet using RW; 02/13/24: 461ft in 56m30s, 7/16: 632ft using 2WW 8/20: 900 ft with RW  9/29: 835 ft with 4WW  11/5: 994 ft with RW 12/12: 997 ft with Rollator Goal status: ONGOING  6.  Patient will improve dynamic gait index score by 4 points or greater to indicate improvement in gait with a rolling walker as well as decreased risk for falls Baseline:9/17:10 11/5: 14/24 Goal status: MET  7.  Pt will report 70% or greater confidence when ambulating around her home and completing her usual daily  activities with LRAD.  Baseline: 30-50%  11/5: 100%  Goal status: MET  8.  Pt will improve mini-best score by  4 points or more to decrease fall risk during functional activity.1 Baseline: 8; 12/15: 11/28 Goal status: ONGOING    ASSESSMENT:  CLINICAL IMPRESSION:    Patient arrived with good motivation for completion of pt activities. Continued with functional LE strength training and activities to improve balance reactions and reduce fall risk. Pt very fatigued after resisted gait at increased speed which caused longer rest breaks to be needed later in session. Pt challenged with stepping over objects, requiring stopping and assessing object prior to stepping over but with this strategy pt able to consistently perform safely although not efficiently. Pt will continue to benefit from skilled physical therapy intervention to address impairments, improve QOL, and attain therapy goals.    OBJECTIVE IMPAIRMENTS: Abnormal gait, decreased activity tolerance, decreased balance, decreased endurance, decreased knowledge of use of DME, decreased mobility, difficulty walking, decreased strength, decreased safety awareness, impaired vision/preception, and pain.   ACTIVITY LIMITATIONS: carrying, lifting, bending, standing, squatting, stairs, transfers, bed mobility, bathing, toileting, dressing, reach over head, hygiene/grooming, and locomotion level  PARTICIPATION LIMITATIONS: meal prep, cleaning, laundry, driving, shopping, and community activity  PERSONAL FACTORS: Age, Time since onset of injury/illness/exacerbation, and 3+ comorbidities: Hypertension, B12 deficiency, type 2 diabetes, hyperlipidemia, CKD stage IIIa are also affecting patient's functional outcome.   REHAB POTENTIAL: Good  CLINICAL DECISION MAKING: Evolving/moderate complexity  EVALUATION COMPLEXITY: Moderate  PLAN:  PT FREQUENCY: 1-2x/week  PT DURATION: 8 weeks  PLANNED INTERVENTIONS: 97164- PT Re-evaluation, 97750- Physical  Performance Testing, 97110-Therapeutic exercises, 97530- Therapeutic activity, [id number]- Neuromuscular re-education, 97535- Self Care, [id number]- Manual therapy, [id number]- Gait training, [phone]- Orthotic Initial, [phone]- Orthotic/Prosthetic subsequent, [phone]- Canalith repositioning, [phone]- Electrical stimulation (manual), Patient/Family education, Balance training, Stair training, Taping, Joint mobilization, Spinal mobilization, Vestibular training, Visual/preceptual remediation/compensation, DME instructions, Cryotherapy, Moist heat, and Biofeedback  PLAN FOR NEXT SESSION:   Progress below activities: Components of Mini-BESTest: -Gait speed, horizontal head turns -Stepping over obstacles -Single leg stance -Balance reactions Obstacle navigation Ambulation on uneven surfaces Stairs Navigating small spaces  Note: Portions of this document were prepared using Dragon voice recognition software and although reviewed may contain unintentional dictation errors in syntax, grammar, or spelling.  Lonni KATHEE Gainer PT ,DPT Physical Therapist- [place]  [facility]    "

## 2024-08-11 ENCOUNTER — Encounter: Admitting: Occupational Therapy

## 2024-08-11 ENCOUNTER — Ambulatory Visit: Admitting: Physical Therapy

## 2024-08-11 ENCOUNTER — Encounter: Admitting: Speech Pathology

## 2024-08-11 DIAGNOSIS — R269 Unspecified abnormalities of gait and mobility: Secondary | ICD-10-CM

## 2024-08-11 DIAGNOSIS — I69351 Hemiplegia and hemiparesis following cerebral infarction affecting right dominant side: Secondary | ICD-10-CM

## 2024-08-11 DIAGNOSIS — R2681 Unsteadiness on feet: Secondary | ICD-10-CM

## 2024-08-11 DIAGNOSIS — M6281 Muscle weakness (generalized): Secondary | ICD-10-CM

## 2024-08-11 NOTE — Therapy (Signed)
 " OUTPATIENT PHYSICAL THERAPY TREATMENT  Patient Name: Haley Jones MRN: [id number] DOB:July 01, 1956, 68 y.o., female Today's Date: 08/11/2024   PCP: Dr. Oneil Jones REFERRING PROVIDER: Toribio Pitch, PA-C  END OF SESSION:   PT End of Session - 08/11/24 1355     Visit Number 54    Number of Visits 56    Date for Recertification  08/13/24    Authorization Type [employer]    Progress Note Due on Visit 60    PT Start Time 1402    PT Stop Time 1442    PT Time Calculation (min) 40 min    Equipment Utilized During Treatment Gait belt    Activity Tolerance Patient tolerated treatment well    Behavior During Therapy WFL for tasks assessed/performed            Past Medical History:  Diagnosis Date   Adnexal mass    Anxiety    Chronic back pain    Depression    Diabetes mellitus without complication (HCC)    Endometriosis 03/12/2018   GERD (gastroesophageal reflux disease)    Heart murmur 02/2018   undetected until she was an adult. 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   Weight loss 12/06/2017   Past Surgical History:  Procedure Laterality Date   ABDOMINAL HYSTERECTOMY  2008   ovaries also   CESAREAN SECTION  1994   COLONOSCOPY     COLONOSCOPY WITH PROPOFOL  N/A 02/12/2018   Procedure: COLONOSCOPY WITH PROPOFOL ;  Surgeon: Haley Jones, Haley POUR, MD;  Location: [facility];  Service: Gastroenterology;  Laterality: N/A;   ESOPHAGOGASTRODUODENOSCOPY (EGD) WITH PROPOFOL  N/A 02/12/2018   Procedure: ESOPHAGOGASTRODUODENOSCOPY (EGD) WITH PROPOFOL ;  Surgeon: Haley Jones, Haley POUR, MD;  Location: [facility];  Service: Gastroenterology;  Laterality: N/A;   EYE SURGERY Left 1973   muscle shortening to straighten cross eyes   LAPAROSCOPY N/A 02/20/2018   Procedure: LAPAROSCOPY DIAGNOSTIC, ( RESECTION OF RIGHT LOWER QUADRANT ABDOMINAL MASS);  Surgeon: Haley Selinda Birmingham, MD;  Location: [facility]  ORS;  Service: General;  Laterality: N/A;   ROTATOR CUFF REPAIR Left 2011   Patient Active Problem List   Diagnosis Date Noted   Cognitive communication deficit 04/07/2024   Aphasia 04/07/2024   Urinary incontinence as sequela of cerebrovascular accident (CVA) 02/11/2024   Depression with anxiety 01/04/2024   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   Acute CVA (cerebrovascular accident) (HCC) 12/20/2023   Primary hypertension 12/20/2023   Acute kidney injury superimposed on chronic kidney disease 12/20/2023   DM type 2 with diabetic mixed hyperlipidemia (HCC) 11/17/2022   Major depressive disorder, recurrent, 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   B12 deficiency 02/23/2021   SBO (small bowel obstruction) (HCC) 01/29/2019   Barrett's esophagus without dysplasia 04/15/2018   Weight loss 12/06/2017   Hoarse 12/06/2017   Lumbar disc disease 07/26/2017   Diabetes mellitus type 2, controlled, without complications (HCC) 02/09/2015   Surgical menopause 11/26/2014    ONSET DATE: 12/20/2023  REFERRING DIAG: P36.487 (ICD-10-CM) - Left middle cerebral artery stroke (HCC)   THERAPY DIAG:  Muscle weakness (generalized)  Unsteadiness on feet  Abnormality of gait  Hemiplegia and hemiparesis following cerebral infarction affecting right dominant side (HCC)  Rationale for Evaluation and Treatment: Rehabilitation  SUBJECTIVE:  SUBJECTIVE STATEMENT:  Pt states she is doing well and feeling better about leaving her walker when ambulating through the house. Had a nice Christmas holiday. No changes since last session.   PERTINENT HISTORY:Pt states she is not sure the exact date of when her CVA happened because she kept falling at home trying to care for herself and her daughter, but she finally went to the hospital when her  sister insisted on it. Pt states her special needs daughter, Haley Jones, passed away recently while pt was in the hospital at [facility] before she was transferred to [facility] for stroke rehabilitation.Pt's sister states pt requires supervision/cuing for ADLs  to don clothes correctly, with correct orientation (potential apraxia?) Pt/sister report pt has R side inattention.Pt reports supposedly one of her legs is shorter than the other, believes her R LE might be the shorter one. 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     PAIN:  Are you having pain? No  PRECAUTIONS: Fall RED FLAGS: None  WEIGHT BEARING RESTRICTIONS: No FALLS: Has patient fallen in last 6 months? Yes. Number of falls 6+ before going to hospital and 1 fall since being home from CIR - pt states she was trying to gather her clothes by herself rather than waiting for her sister to get there to help her  LIVING ENVIRONMENT Lives with: lives alone and but pt's sister, Haley Jones, comes over daily to assist with ADLs and stays to provide supervision all day - pt states at night she would be able to walk to bathroom using RW if needed (but she wears depends in event of incontinence) Lives in: House/apartment Stairs: she has steps, but she doesn't have to use them (house was wheelchair accessible for her daughter)  Has following equipment at home: [employer] - 2 wheeled, shower chair, Grab bars, Ramped entry, and transport chair  PLOF: Independent, Independent with household mobility without device, Independent with homemaking with ambulation, Independent with gait, Independent with transfers, and she was the primary caregiver for her recently deceased daughter   CLOF: Sister provides supervision daily for pt to ambulate using RW safely and provides assist for ADLs (showering and dressing - progressing towards supervision with these activities); Sister states pt requires cues to turn and  step back fully prior to sitting to ensure her safety  PATIENT GOALS: get back to being able to go shopping with improved community level ambulation without the walker; return to driving   OBJECTIVE:  Note: Objective measures were completed at evaluation unless otherwise noted.  LOWER EXTREMITY MMT:    MMT Right Eval Left Eval  Hip flexion 3+, no pain 3+ with some L hip and back pain with this  Knee flexion 3+ 4  Knee extension 4- 4  Ankle dorsiflexion 3+ 4  Ankle plantarflexion 4- 4  (Blank rows = not tested)  TREATMENT DATE: 08/11/2024  Pt session was cut a little short today due to pt arriving late to scheduled appointment time   TA- To improve functional movements patterns for everyday tasks    Stair navigation x 3 flights of stairs with rest at top of last flight - reciprocal pattern throughout.  - 81 steps in stairwell ascending and descending with ad lib UE support   obstacle navigation step over challenge 3 x 2 laps with several hurdles and 1/2 foam rollers - challenged to change wich foot stepping over  NMR: To facilitate reeducation of movement, balance, posture, coordination, and/or proprioception/kinesthetic sense.  Airex lateral step on and off x 10 ea side - lots of cues to not use UE assist  - no significant LOB despite constant need for support  Unless otherwise stated, CGA was provided and gait belt donned in order to ensure pt safety  PATIENT EDUCATION: Education details: Pt educated on progress note findings Person educated: Patient Education method: Explanation Education comprehension: verbalized understanding   HOME EXERCISE PROGRAM: Access Code: [email or web address] URL: [email or web address] Date: 02/04/2024 Prepared by: Peggye Linear  Exercises - Standing March with Counter Support  - 2 x daily - 7 x weekly - 2 sets  - 20 reps - Side stepping with counter support: yellow band loop  - 2 x daily - 7 x weekly - 2 sets - 20 reps - Sit to Stand with Counter Support  - 2 x daily - 7 x weekly - 2 sets - 12 reps   GOALS: Goals reviewed with patient? Yes  SHORT TERM GOALS: Target date: 03/10/2024  Pt will be independent with HEP in order to improve strength and balance in order to decrease fall risk and improve function at home and work.  Baseline: need to initiate7/16: 2-3x week Goal status:  MET  LONG TERM GOALS: Target date: 08/13/2024  1.  Patient will complete five times sit to stand test in < 15 seconds indicating an increased LE strength and improved balance. Baseline:  38.75 seconds, using UE support, 7/16: 20.35 sec, UE support on 1st STS only 8/20:16.86 sec no UE support 9/10:12.16 sec  Goal Status: MET   2.  Patient will increase ABC scale score >50% to demonstrate better functional mobility and better confidence with ADLs.   Baseline: 7.5%, 7/16: 18.125% 8/20: 18.75% 9/10: 16.25% 11/5: 45% 12/15: 65% Goal status: ONGOING   3.  Patient will increase Berg Balance score by > 6 points to demonstrate decreased fall risk during functional activities. Baseline: need to assess; 01/30/2024= 34/56, 7/16= 39 8/20:42 Goal status: MET   4. Patient will increase 10 meter walk test to >1.82m/s as to improve gait speed for better community ambulation and to reduce fall risk. Baseline: 0.48 m/s using youth RW, requires CGA for safety, 7/16: 0.69 m/s 8/20:  .76 m/s 9/10:.70 m/s 11/05: 11:42 seconds using FWW, .88 m/s 12/15: with Rollator.0.84 m/s and without AD 0.88 m/s Goal status: ONGOING  5. Patient will increase six minute walk test distance to >1000 for progression to community ambulator and improve gait ability Baseline: need to assess; 01/30/2024=410 feet using RW; 02/13/24: 422ft in 70m30s, 7/16: 663ft using 2WW 8/20: 900 ft with RW  9/29: 835 ft with 4WW  11/5: 994 ft with RW 12/12: 997 ft with  Rollator Goal status: ONGOING  6.  Patient will improve dynamic gait index score by 4 points or greater to indicate improvement in gait with a rolling walker as well as decreased risk for  falls Baseline:9/17:10 11/5: 14/24 Goal status: MET  7.  Pt will report 70% or greater confidence when ambulating around her home and completing her usual daily activities with LRAD.  Baseline: 30-50%  11/5: 100%  Goal status: MET  8.  Pt will improve mini-best score by 4 points or more to decrease fall risk during functional activity.1 Baseline: 8; 12/15: 11/28 Goal status: ONGOING    ASSESSMENT:  CLINICAL IMPRESSION:    Patient arrived with good motivation for completion of pt activities. Pt progressed with stair navigation with less need for rest breaks, although still fatiguing. Pt also progressing with obstacle navigation with less error today but pt still unable to smoothly navigate objects. Pt had most trouble with airex step on and off, would like to progress this in areas outside of support bar to challenge her balance reactions while not undermining her newly established confidence with activities.  Pt will continue to benefit from skilled physical therapy intervention to address impairments, improve QOL, and attain therapy goals.    OBJECTIVE IMPAIRMENTS: Abnormal gait, decreased activity tolerance, decreased balance, decreased endurance, decreased knowledge of use of DME, decreased mobility, difficulty walking, decreased strength, decreased safety awareness, impaired vision/preception, and pain.   ACTIVITY LIMITATIONS: carrying, lifting, bending, standing, squatting, stairs, transfers, bed mobility, bathing, toileting, dressing, reach over head, hygiene/grooming, and locomotion level  PARTICIPATION LIMITATIONS: meal prep, cleaning, laundry, driving, shopping, and community activity  PERSONAL FACTORS: Age, Time since onset of injury/illness/exacerbation, and 3+ comorbidities: Hypertension,  B12 deficiency, type 2 diabetes, hyperlipidemia, CKD stage IIIa are also affecting patient's functional outcome.   REHAB POTENTIAL: Good  CLINICAL DECISION MAKING: Evolving/moderate complexity  EVALUATION COMPLEXITY: Moderate  PLAN:  PT FREQUENCY: 1-2x/week  PT DURATION: 8 weeks  PLANNED INTERVENTIONS: [id number]- PT Re-evaluation, 97750- Physical Performance Testing, 97110-Therapeutic exercises, 97530- Therapeutic activity, [id number]- Neuromuscular re-education, 97535- Self Care, [id number]- Manual therapy, [id number]- Gait training, [phone]- Orthotic Initial, [phone]- Orthotic/Prosthetic subsequent, [phone]- Canalith repositioning, [phone]- Electrical stimulation (manual), Patient/Family education, Balance training, Stair training, Taping, Joint mobilization, Spinal mobilization, Vestibular training, Visual/preceptual remediation/compensation, DME instructions, Cryotherapy, Moist heat, and Biofeedback  PLAN FOR NEXT SESSION:   Progress below activities:  Components of Mini-BESTest: -Gait speed, horizontal head turns -Stepping over obstacles -Single leg stance -Balance reactions Obstacle navigation Ambulation on uneven surfaces On and off airex variable direction without UE support ( balance recovery strategies)  Navigating small spaces  Note: Portions of this document were prepared using Dragon voice recognition software and although reviewed may contain unintentional dictation errors in syntax, grammar, or spelling.  Lonni KATHEE Gainer PT ,DPT Physical Therapist- [place]  [facility]    "

## 2024-08-13 ENCOUNTER — Encounter: Admitting: Occupational Therapy

## 2024-08-13 ENCOUNTER — Encounter: Admitting: Speech Pathology

## 2024-08-13 ENCOUNTER — Ambulatory Visit

## 2024-08-13 DIAGNOSIS — M6281 Muscle weakness (generalized): Secondary | ICD-10-CM | POA: Diagnosis not present

## 2024-08-13 DIAGNOSIS — R269 Unspecified abnormalities of gait and mobility: Secondary | ICD-10-CM

## 2024-08-13 DIAGNOSIS — I69351 Hemiplegia and hemiparesis following cerebral infarction affecting right dominant side: Secondary | ICD-10-CM

## 2024-08-13 DIAGNOSIS — R2681 Unsteadiness on feet: Secondary | ICD-10-CM

## 2024-08-13 NOTE — Therapy (Signed)
 " OUTPATIENT PHYSICAL THERAPY TREATMENT/ RECERT  Patient Name: Haley Jones MRN: [id number] DOB:10-Aug-1956, 68 y.o., female Today's Date: 08/13/2024   PCP: Dr. Oneil Jones REFERRING PROVIDER: Toribio Pitch, PA-C  END OF SESSION:   PT End of Session - 08/13/24 1402     Visit Number 55    Number of Visits 64    Date for Recertification  10/08/24    Authorization Type [employer]    Progress Note Due on Visit 60    PT Start Time 1402    PT Stop Time 1444    PT Time Calculation (min) 42 min    Equipment Utilized During Treatment Gait belt    Activity Tolerance Patient tolerated treatment well    Behavior During Therapy WFL for tasks assessed/performed             Past Medical History:  Diagnosis Date   Adnexal mass    Anxiety    Chronic back pain    Depression    Diabetes mellitus without complication (HCC)    Endometriosis 03/12/2018   GERD (gastroesophageal reflux disease)    Heart murmur 02/2018   undetected until she was an adult. 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   Weight loss 12/06/2017   Past Surgical History:  Procedure Laterality Date   ABDOMINAL HYSTERECTOMY  2008   ovaries also   CESAREAN SECTION  1994   COLONOSCOPY     COLONOSCOPY WITH PROPOFOL  N/A 02/12/2018   Procedure: COLONOSCOPY WITH PROPOFOL ;  Surgeon: Toledo, Ladell POUR, MD;  Location: [facility];  Service: Gastroenterology;  Laterality: N/A;   ESOPHAGOGASTRODUODENOSCOPY (EGD) WITH PROPOFOL  N/A 02/12/2018   Procedure: ESOPHAGOGASTRODUODENOSCOPY (EGD) WITH PROPOFOL ;  Surgeon: Toledo, Ladell POUR, MD;  Location: [facility];  Service: Gastroenterology;  Laterality: N/A;   EYE SURGERY Left 1973   muscle shortening to straighten cross eyes   LAPAROSCOPY N/A 02/20/2018   Procedure: LAPAROSCOPY DIAGNOSTIC, ( RESECTION OF RIGHT LOWER QUADRANT ABDOMINAL MASS);  Surgeon: Nicholaus Selinda Birmingham, MD;   Location: [facility];  Service: General;  Laterality: N/A;   ROTATOR CUFF REPAIR Left 2011   Patient Active Problem List   Diagnosis Date Noted   Cognitive communication deficit 04/07/2024   Aphasia 04/07/2024   Urinary incontinence as sequela of cerebrovascular accident (CVA) 02/11/2024   Depression with anxiety 01/04/2024   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   Acute CVA (cerebrovascular accident) (HCC) 12/20/2023   Primary hypertension 12/20/2023   Acute kidney injury superimposed on chronic kidney disease 12/20/2023   DM type 2 with diabetic mixed hyperlipidemia (HCC) 11/17/2022   Major depressive disorder, recurrent, 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   B12 deficiency 02/23/2021   SBO (small bowel obstruction) (HCC) 01/29/2019   Barrett's esophagus without dysplasia 04/15/2018   Weight loss 12/06/2017   Hoarse 12/06/2017   Lumbar disc disease 07/26/2017   Diabetes mellitus type 2, controlled, without complications (HCC) 02/09/2015   Surgical menopause 11/26/2014    ONSET DATE: 12/20/2023  REFERRING DIAG: P36.487 (ICD-10-CM) - Left middle cerebral artery stroke (HCC)   THERAPY DIAG:  Muscle weakness (generalized) - Plan: PT plan of care cert/re-cert  Unsteadiness on feet - Plan: PT plan of care cert/re-cert  Abnormality of gait - Plan: PT plan of care cert/re-cert  Hemiplegia and hemiparesis following cerebral infarction affecting right dominant side (HCC) - Plan: PT plan of care cert/re-cert  Rationale for Evaluation and Treatment: Rehabilitation  SUBJECTIVE:                                                                                                                                                                                        SUBJECTIVE STATEMENT:  Patient reports she wants to continue working on her balance. Reports her balance is getting better but not where she wants it to be.   PERTINENT HISTORY:Pt states she is not sure the exact date of when her CVA  happened because she kept falling at home trying to care for herself and her daughter, but she finally went to the hospital when her sister insisted on it. Pt states her special needs daughter, Haley Jones, passed away recently while pt was in the hospital at [facility] before she was transferred to [facility] for stroke rehabilitation.Pt's sister states pt requires supervision/cuing for ADLs  to don clothes correctly, with correct orientation (potential apraxia?) Pt/sister report pt has R side inattention.Pt reports supposedly one of her legs is shorter than the other, believes her R LE might be the shorter one. 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     PAIN:  Are you having pain? No  PRECAUTIONS: Fall RED FLAGS: None  WEIGHT BEARING RESTRICTIONS: No FALLS: Has patient fallen in last 6 months? Yes. Number of falls 6+ before going to hospital and 1 fall since being home from CIR - pt states she was trying to gather her clothes by herself rather than waiting for her sister to get there to help her  LIVING ENVIRONMENT Lives with: lives alone and but pt's sister, Haley Jones, comes over daily to assist with ADLs and stays to provide supervision all day - pt states at night she would be able to walk to bathroom using RW if needed (but she wears depends in event of incontinence) Lives in: House/apartment Stairs: she has steps, but she doesn't have to use them (house was wheelchair accessible for her daughter)  Has following equipment at home: Environmental [employer]ant - 2 wheeled, shower chair, Grab bars, Ramped entry, and transport chair  PLOF: Independent, Independent with household mobility without device, Independent with homemaking with ambulation, Independent with gait, Independent with transfers, and she was the primary caregiver for her recently deceased daughter   CLOF: Sister provides supervision daily for pt to ambulate using RW safely and provides assist for  ADLs (showering and dressing - progressing towards supervision with these activities); Sister states pt requires cues to turn and step back fully prior to sitting to ensure her safety  PATIENT GOALS: get back to being able to go shopping with improved  community level ambulation without the walker; return to driving   OBJECTIVE:  Note: Objective measures were completed at evaluation unless otherwise noted.  LOWER EXTREMITY MMT:    MMT Right Eval Left Eval  Hip flexion 3+, no pain 3+ with some L hip and back pain with this  Knee flexion 3+ 4  Knee extension 4- 4  Ankle dorsiflexion 3+ 4  Ankle plantarflexion 4- 4  (Blank rows = not tested)                                                                                                                              TREATMENT DATE: 08/13/2024  TA- To improve functional movements patterns for everyday tasks   Activity Description: one pod per stair Activity Setting:  The Blaze Pod Random setting was chosen to enhance cognitive processing and agility, providing an unpredictable environment to simulate real-world scenarios, and fostering quick reactions and adaptability.   Number of Pods:  6 Cycles/Sets:  3 Duration (Time or Hit Count):  10  Fast/slow ambulation 300 ft without AD with cues for sequencing and change of gait speed.; x 2 set  Sit to stand 10x with arms crossed   NMR: To facilitate reeducation of movement, balance, posture, coordination, and/or proprioception/kinesthetic sense. Airex pad 6 step tandem stance 3x30 seconds each LE Airex pad: 6 step toe taps 12x each LE Airex pad: 6 step lateral tap 10x each LE  Incline balance with EC 3 x 30 sec ; vertical head turns 10x, horizontal head turns 10x  Unless otherwise stated, CGA was provided and gait belt donned in order to ensure pt safety  PATIENT EDUCATION: Education details: Pt educated on progress note findings Person educated: Patient Education method:  Explanation Education comprehension: verbalized understanding   HOME EXERCISE PROGRAM: Access Code: [email or web address] URL: [email or web address] Date: 02/04/2024 Prepared by: Peggye Linear  Exercises - Standing March with Counter Support  - 2 x daily - 7 x weekly - 2 sets - 20 reps - Side stepping with counter support: yellow band loop  - 2 x daily - 7 x weekly - 2 sets - 20 reps - Sit to Stand with Counter Support  - 2 x daily - 7 x weekly - 2 sets - 12 reps   GOALS: Goals reviewed with patient? Yes  SHORT TERM GOALS: Target date: 03/10/2024  Pt will be independent with HEP in order to improve strength and balance in order to decrease fall risk and improve function at home and work.  Baseline: need to initiate7/16: 2-3x week Goal status:  MET  LONG TERM GOALS: Target date: 10/08/2024    1.  Patient will complete five times sit to stand test in < 15 seconds indicating an increased LE strength and improved balance. Baseline:  38.75 seconds, using UE support, 7/16: 20.35 sec, UE support on 1st STS only 8/20:16.86 sec no UE support 9/10:12.16 sec  Goal Status: MET   2.  Patient will  increase ABC scale score >50% to demonstrate better functional mobility and better confidence with ADLs.   Baseline: 7.5%, 7/16: 18.125% 8/20: 18.75% 9/10: 16.25% 11/5: 45% 12/15: 65% Goal status: ONGOING   3.  Patient will increase Berg Balance score by > 6 points to demonstrate decreased fall risk during functional activities. Baseline: need to assess; 01/30/2024= 34/56, 7/16= 39 8/20:42 Goal status: MET   4. Patient will increase 10 meter walk test to >1.71m/s as to improve gait speed for better community ambulation and to reduce fall risk. Baseline: 0.48 m/s using youth RW, requires CGA for safety, 7/16: 0.69 m/s 8/20:  .76 m/s 9/10:.70 m/s 11/05: 11:42 seconds using FWW, .88 m/s 12/15: with Rollator.0.84 m/s and without AD 0.88 m/s Goal status: ONGOING  5. Patient will increase six  minute walk test distance to >1000 for progression to community ambulator and improve gait ability Baseline: need to assess; 01/30/2024=410 feet using RW; 02/13/24: 445ft in 50m30s, 7/16: 680ft using 2WW 8/20: 900 ft with RW  9/29: 835 ft with 4WW  11/5: 994 ft with RW 12/12: 997 ft with Rollator Goal status: ONGOING  6.  Patient will improve dynamic gait index score by 4 points or greater to indicate improvement in gait with a rolling walker as well as decreased risk for falls Baseline:9/17:10 11/5: 14/24 Goal status: MET  7.  Pt will report 70% or greater confidence when ambulating around her home and completing her usual daily activities with LRAD.  Baseline: 30-50%  11/5: 100%  Goal status: MET  8.  Pt will improve mini-best score by 4 points or more to decrease fall risk during functional activity.1 Baseline: 8; 12/15: 11/28 Goal status: ONGOING    ASSESSMENT:  CLINICAL IMPRESSION:    Patient would benefit from recert to address residual deficits of functional mobility and stability. Please look at note from 07/28/24 to see goal performance and examination. Patient agreeable with this plan of care at this time.  Patient is highly motivated and eager to progress her mobility at this time. Pt will continue to benefit from skilled physical therapy intervention to address impairments, improve QOL, and attain therapy goals.    OBJECTIVE IMPAIRMENTS: Abnormal gait, decreased activity tolerance, decreased balance, decreased endurance, decreased knowledge of use of DME, decreased mobility, difficulty walking, decreased strength, decreased safety awareness, impaired vision/preception, and pain.   ACTIVITY LIMITATIONS: carrying, lifting, bending, standing, squatting, stairs, transfers, bed mobility, bathing, toileting, dressing, reach over head, hygiene/grooming, and locomotion level  PARTICIPATION LIMITATIONS: meal prep, cleaning, laundry, driving, shopping, and community activity  PERSONAL  FACTORS: Age, Time since onset of injury/illness/exacerbation, and 3+ comorbidities: Hypertension, B12 deficiency, type 2 diabetes, hyperlipidemia, CKD stage IIIa are also affecting patient's functional outcome.   REHAB POTENTIAL: Good  CLINICAL DECISION MAKING: Evolving/moderate complexity  EVALUATION COMPLEXITY: Moderate  PLAN:  PT FREQUENCY: 1-2x/week  PT DURATION: 8 weeks  PLANNED INTERVENTIONS: 97164- PT Re-evaluation, 97750- Physical Performance Testing, 97110-Therapeutic exercises, 97530- Therapeutic activity, [id number]- Neuromuscular re-education, 97535- Self Care, [id number]- Manual therapy, [id number]- Gait training, [phone]- Orthotic Initial, [phone]- Orthotic/Prosthetic subsequent, [phone]- Canalith repositioning, [phone]- Electrical stimulation (manual), Patient/Family education, Balance training, Stair training, Taping, Joint mobilization, Spinal mobilization, Vestibular training, Visual/preceptual remediation/compensation, DME instructions, Cryotherapy, Moist heat, and Biofeedback  PLAN FOR NEXT SESSION:   Progress below activities:  Components of Mini-BESTest: -Gait speed, horizontal head turns -Stepping over obstacles -Single leg stance -Balance reactions Obstacle navigation Ambulation on uneven surfaces On and off airex variable direction without UE support ( balance recovery strategies)  Navigating small spaces   Areli Jowett  Leopoldo PT ,DPT Physical Therapist- [place]  [facility]    "

## 2024-08-18 ENCOUNTER — Encounter: Admitting: Occupational Therapy

## 2024-08-18 ENCOUNTER — Encounter: Admitting: Speech Pathology

## 2024-08-18 ENCOUNTER — Ambulatory Visit: Attending: Physician Assistant | Admitting: Physical Therapy

## 2024-08-18 DIAGNOSIS — I69351 Hemiplegia and hemiparesis following cerebral infarction affecting right dominant side: Secondary | ICD-10-CM | POA: Insufficient documentation

## 2024-08-18 DIAGNOSIS — R2681 Unsteadiness on feet: Secondary | ICD-10-CM | POA: Insufficient documentation

## 2024-08-18 DIAGNOSIS — M6281 Muscle weakness (generalized): Secondary | ICD-10-CM | POA: Diagnosis present

## 2024-08-18 DIAGNOSIS — R278 Other lack of coordination: Secondary | ICD-10-CM | POA: Diagnosis present

## 2024-08-18 DIAGNOSIS — R4701 Aphasia: Secondary | ICD-10-CM | POA: Diagnosis present

## 2024-08-18 DIAGNOSIS — I63512 Cerebral infarction due to unspecified occlusion or stenosis of left middle cerebral artery: Secondary | ICD-10-CM | POA: Insufficient documentation

## 2024-08-18 DIAGNOSIS — R41841 Cognitive communication deficit: Secondary | ICD-10-CM | POA: Insufficient documentation

## 2024-08-18 DIAGNOSIS — H543 Unqualified visual loss, both eyes: Secondary | ICD-10-CM | POA: Diagnosis present

## 2024-08-18 DIAGNOSIS — R269 Unspecified abnormalities of gait and mobility: Secondary | ICD-10-CM | POA: Diagnosis present

## 2024-08-18 NOTE — Therapy (Signed)
 " OUTPATIENT PHYSICAL THERAPY TREATMENT  Patient Name: Haley Jones MRN: [id number] DOB:1955-10-07, 69 y.o., female Today's Date: 08/18/2024   PCP: Dr. Oneil Pinal REFERRING PROVIDER: Toribio Pitch, PA-C  END OF SESSION:   PT End of Session - 08/18/24 1414     Visit Number 56    Number of Visits 64    Date for Recertification  10/08/24    Authorization Type [employer]    Progress Note Due on Visit 60    PT Start Time 1402    PT Stop Time 1442    PT Time Calculation (min) 40 min    Equipment Utilized During Treatment Gait belt    Activity Tolerance Patient tolerated treatment well    Behavior During Therapy WFL for tasks assessed/performed              Past Medical History:  Diagnosis Date   Adnexal mass    Anxiety    Chronic back pain    Depression    Diabetes mellitus without complication (HCC)    Endometriosis 03/12/2018   GERD (gastroesophageal reflux disease)    Heart murmur 02/2018   undetected until she was an adult. 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   Weight loss 12/06/2017   Past Surgical History:  Procedure Laterality Date   ABDOMINAL HYSTERECTOMY  2008   ovaries also   CESAREAN SECTION  1994   COLONOSCOPY     COLONOSCOPY WITH PROPOFOL  N/A 02/12/2018   Procedure: COLONOSCOPY WITH PROPOFOL ;  Surgeon: Toledo, Ladell POUR, MD;  Location: [facility];  Service: Gastroenterology;  Laterality: N/A;   ESOPHAGOGASTRODUODENOSCOPY (EGD) WITH PROPOFOL  N/A 02/12/2018   Procedure: ESOPHAGOGASTRODUODENOSCOPY (EGD) WITH PROPOFOL ;  Surgeon: Toledo, Ladell POUR, MD;  Location: [facility];  Service: Gastroenterology;  Laterality: N/A;   EYE SURGERY Left 1973   muscle shortening to straighten cross eyes   LAPAROSCOPY N/A 02/20/2018   Procedure: LAPAROSCOPY DIAGNOSTIC, ( RESECTION OF RIGHT LOWER QUADRANT ABDOMINAL MASS);  Surgeon: Nicholaus Selinda Birmingham, MD;  Location:  [facility] ORS;  Service: General;  Laterality: N/A;   ROTATOR CUFF REPAIR Left 2011   Patient Active Problem List   Diagnosis Date Noted   Cognitive communication deficit 04/07/2024   Aphasia 04/07/2024   Urinary incontinence as sequela of cerebrovascular accident (CVA) 02/11/2024   Depression with anxiety 01/04/2024   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   Acute CVA (cerebrovascular accident) (HCC) 12/20/2023   Primary hypertension 12/20/2023   Acute kidney injury superimposed on chronic kidney disease 12/20/2023   DM type 2 with diabetic mixed hyperlipidemia (HCC) 11/17/2022   Major depressive disorder, recurrent, 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   B12 deficiency 02/23/2021   SBO (small bowel obstruction) (HCC) 01/29/2019   Barrett's esophagus without dysplasia 04/15/2018   Weight loss 12/06/2017   Hoarse 12/06/2017   Lumbar disc disease 07/26/2017   Diabetes mellitus type 2, controlled, without complications (HCC) 02/09/2015   Surgical menopause 11/26/2014    ONSET DATE: 12/20/2023  REFERRING DIAG: P36.487 (ICD-10-CM) - Left middle cerebral artery stroke (HCC)   THERAPY DIAG:  Muscle weakness (generalized)  Unsteadiness on feet  Abnormality of gait  Hemiplegia and hemiparesis following cerebral infarction affecting right dominant side (HCC)  Rationale for Evaluation and Treatment: Rehabilitation  SUBJECTIVE:  SUBJECTIVE STATEMENT:  Pt reports doing well today. Pt denies any recent falls/stumbles since prior session. Pt denies any updates to medications or medical appointment since prior session. Pt reports good compliance with HEP when time permits.   PERTINENT HISTORY:Pt states she is not sure the exact date of when her CVA happened because she kept falling at home trying to care for herself and  her daughter, but she finally went to the hospital when her sister insisted on it. Pt states her special needs daughter, Haley Jones, passed away recently while pt was in the hospital at [facility] before she was transferred to [facility] for stroke rehabilitation.Pt's sister states pt requires supervision/cuing for ADLs  to don clothes correctly, with correct orientation (potential apraxia?) Pt/sister report pt has R side inattention.Pt reports supposedly one of her legs is shorter than the other, believes her R LE might be the shorter one. 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     PAIN:  Are you having pain? No  PRECAUTIONS: Fall RED FLAGS: None  WEIGHT BEARING RESTRICTIONS: No FALLS: Has patient fallen in last 6 months? Yes. Number of falls 6+ before going to hospital and 1 fall since being home from CIR - pt states she was trying to gather her clothes by herself rather than waiting for her sister to get there to help her  LIVING ENVIRONMENT Lives with: lives alone and but pt's sister, Haley Jones, comes over daily to assist with ADLs and stays to provide supervision all day - pt states at night she would be able to walk to bathroom using RW if needed (but she wears depends in event of incontinence) Lives in: House/apartment Stairs: she has steps, but she doesn't have to use them (house was wheelchair accessible for her daughter)  Has following equipment at home: [employer] - 2 wheeled, shower chair, Grab bars, Ramped entry, and transport chair  PLOF: Independent, Independent with household mobility without device, Independent with homemaking with ambulation, Independent with gait, Independent with transfers, and she was the primary caregiver for her recently deceased daughter   CLOF: Sister provides supervision daily for pt to ambulate using RW safely and provides assist for ADLs (showering and dressing - progressing towards supervision with these  activities); Sister states pt requires cues to turn and step back fully prior to sitting to ensure her safety  PATIENT GOALS: get back to being able to go shopping with improved community level ambulation without the walker; return to driving   OBJECTIVE:  Note: Objective measures were completed at evaluation unless otherwise noted.  LOWER EXTREMITY MMT:    MMT Right Eval Left Eval  Hip flexion 3+, no pain 3+ with some L hip and back pain with this  Knee flexion 3+ 4  Knee extension 4- 4  Ankle dorsiflexion 3+ 4  Ankle plantarflexion 4- 4  (Blank rows = not tested)  TREATMENT DATE: 08/18/2024  TA- To improve functional movements patterns for everyday tasks    Gait x 1100 ft with 3# AW no AD - fatigue noted but no seated rest breaks needed   Sit to stand 2x10 with 5KG ball held in hands - cues to keep close to chest with arms crossed   NMR: To facilitate reeducation of movement, balance, posture, coordination, and/or proprioception/kinesthetic sense.  Airex pad: 6 step toe taps 2x10 each LE  Airex pad taps to corresponding hedgehog color 2 x 15 - cues to minimize UE assist.   Lateral step over hurdle 2 x 10 ea LE with rest between sets   Pt required occasional rest breaks due fatigue, PT was attentive to when pt appeared to be tired or winded in order to prevent excessive fatigue.  Unless otherwise stated, CGA was provided and gait belt donned in order to ensure pt safety  PATIENT EDUCATION: Education details: Pt educated on progress note findings Person educated: Patient Education method: Explanation Education comprehension: verbalized understanding   HOME EXERCISE PROGRAM: Access Code: [email or web address] URL: [email or web address] Date: 02/04/2024 Prepared by: Peggye Linear  Exercises - Standing March with Counter Support  - 2 x daily - 7  x weekly - 2 sets - 20 reps - Side stepping with counter support: yellow band loop  - 2 x daily - 7 x weekly - 2 sets - 20 reps - Sit to Stand with Counter Support  - 2 x daily - 7 x weekly - 2 sets - 12 reps   GOALS: Goals reviewed with patient? Yes  SHORT TERM GOALS: Target date: 03/10/2024  Pt will be independent with HEP in order to improve strength and balance in order to decrease fall risk and improve function at home and work.  Baseline: need to initiate7/16: 2-3x week Goal status:  MET  LONG TERM GOALS: Target date: 10/08/2024    1.  Patient will complete five times sit to stand test in < 15 seconds indicating an increased LE strength and improved balance. Baseline:  38.75 seconds, using UE support, 7/16: 20.35 sec, UE support on 1st STS only 8/20:16.86 sec no UE support 9/10:12.16 sec  Goal Status: MET   2.  Patient will increase ABC scale score >50% to demonstrate better functional mobility and better confidence with ADLs.   Baseline: 7.5%, 7/16: 18.125% 8/20: 18.75% 9/10: 16.25% 11/5: 45% 12/15: 65% Goal status: ONGOING   3.  Patient will increase Berg Balance score by > 6 points to demonstrate decreased fall risk during functional activities. Baseline: need to assess; 01/30/2024= 34/56, 7/16= 39 8/20:42 Goal status: MET   4. Patient will increase 10 meter walk test to >1.76m/s as to improve gait speed for better community ambulation and to reduce fall risk. Baseline: 0.48 m/s using youth RW, requires CGA for safety, 7/16: 0.69 m/s 8/20:  .76 m/s 9/10:.70 m/s 11/05: 11:42 seconds using FWW, .88 m/s 12/15: with Rollator.0.84 m/s and without AD 0.88 m/s Goal status: ONGOING  5. Patient will increase six minute walk test distance to >1000 for progression to community ambulator and improve gait ability Baseline: need to assess; 01/30/2024=410 feet using RW; 02/13/24: 439ft in 50m30s, 7/16: 626ft using 2WW 8/20: 900 ft with RW  9/29: 835 ft with 4WW  11/5: 994 ft with RW 12/12:  997 ft with Rollator Goal status: ONGOING  6.  Patient will improve dynamic gait index score by 4 points or greater to indicate improvement in gait with a rolling walker as  well as decreased risk for falls Baseline:9/17:10 11/5: 14/24 Goal status: MET  7.  Pt will report 70% or greater confidence when ambulating around her home and completing her usual daily activities with LRAD.  Baseline: 30-50%  11/5: 100%  Goal status: MET  8.  Pt will improve mini-best score by 4 points or more to decrease fall risk during functional activity.1 Baseline: 8; 12/15: 11/28 Goal status: ONGOING    ASSESSMENT:  CLINICAL IMPRESSION:    Patient presents a good motivation for completion of physical therapy activities.  Patient progressing well with functional mobility and strength.  Patient becoming more confident with activities at home and choosing electing to leave walker behind when she feels confident navigating her bathroom or other rooms around her home.  Patient does continue to have difficulty with right lower extremity stepping strategies as well as general adaptions to loss of balance.  Will continue to progress and work on this in future sessions as we are able.Pt will continue to benefit from skilled physical therapy intervention to address impairments, improve QOL, and attain therapy goals.    OBJECTIVE IMPAIRMENTS: Abnormal gait, decreased activity tolerance, decreased balance, decreased endurance, decreased knowledge of use of DME, decreased mobility, difficulty walking, decreased strength, decreased safety awareness, impaired vision/preception, and pain.   ACTIVITY LIMITATIONS: carrying, lifting, bending, standing, squatting, stairs, transfers, bed mobility, bathing, toileting, dressing, reach over head, hygiene/grooming, and locomotion level  PARTICIPATION LIMITATIONS: meal prep, cleaning, laundry, driving, shopping, and community activity  PERSONAL FACTORS: Age, Time since onset of  injury/illness/exacerbation, and 3+ comorbidities: Hypertension, B12 deficiency, type 2 diabetes, hyperlipidemia, CKD stage IIIa are also affecting patient's functional outcome.   REHAB POTENTIAL: Good  CLINICAL DECISION MAKING: Evolving/moderate complexity  EVALUATION COMPLEXITY: Moderate  PLAN:  PT FREQUENCY: 1-2x/week  PT DURATION: 8 weeks  PLANNED INTERVENTIONS: 97164- PT Re-evaluation, 97750- Physical Performance Testing, 97110-Therapeutic exercises, 97530- Therapeutic activity, [id number]- Neuromuscular re-education, 97535- Self Care, [id number]- Manual therapy, [id number]- Gait training, [phone]- Orthotic Initial, [phone]- Orthotic/Prosthetic subsequent, [phone]- Canalith repositioning, [phone]- Electrical stimulation (manual), Patient/Family education, Balance training, Stair training, Taping, Joint mobilization, Spinal mobilization, Vestibular training, Visual/preceptual remediation/compensation, DME instructions, Cryotherapy, Moist heat, and Biofeedback  PLAN FOR NEXT SESSION:   Progress below activities:  Components of Mini-BESTest: -Gait speed, horizontal head turns -Stepping over obstacles -Single leg stance -Balance reactions Obstacle navigation Ambulation on uneven surfaces On and off airex variable direction without UE support ( balance recovery strategies)  Navigating small spaces   Lonni KATHEE Gainer PT ,DPT Physical Therapist- [place]  [facility]    "

## 2024-08-20 ENCOUNTER — Ambulatory Visit: Admitting: Physical Therapy

## 2024-08-20 ENCOUNTER — Encounter: Admitting: Speech Pathology

## 2024-08-20 ENCOUNTER — Encounter: Admitting: Occupational Therapy

## 2024-08-20 DIAGNOSIS — M6281 Muscle weakness (generalized): Secondary | ICD-10-CM

## 2024-08-20 DIAGNOSIS — R2681 Unsteadiness on feet: Secondary | ICD-10-CM

## 2024-08-20 DIAGNOSIS — R269 Unspecified abnormalities of gait and mobility: Secondary | ICD-10-CM

## 2024-08-20 NOTE — Therapy (Signed)
 " OUTPATIENT PHYSICAL THERAPY TREATMENT  Patient Name: Haley Jones MRN: [id number] DOB:1956/05/24, 69 y.o., female Today's Date: 08/20/2024   PCP: Dr. Oneil Pinal REFERRING PROVIDER: Toribio Pitch, PA-C  END OF SESSION:   PT End of Session - 08/20/24 1406     Visit Number 57    Number of Visits 64    Date for Recertification  10/08/24    Authorization Type [employer]    Progress Note Due on Visit 60    PT Start Time 1403    PT Stop Time 1442    PT Time Calculation (min) 39 min    Equipment Utilized During Treatment Gait belt    Activity Tolerance Patient tolerated treatment well    Behavior During Therapy WFL for tasks assessed/performed              Past Medical History:  Diagnosis Date   Adnexal mass    Anxiety    Chronic back pain    Depression    Diabetes mellitus without complication (HCC)    Endometriosis 03/12/2018   GERD (gastroesophageal reflux disease)    Heart murmur 02/2018   undetected until she was an adult. 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   Weight loss 12/06/2017   Past Surgical History:  Procedure Laterality Date   ABDOMINAL HYSTERECTOMY  2008   ovaries also   CESAREAN SECTION  1994   COLONOSCOPY     COLONOSCOPY WITH PROPOFOL  N/A 02/12/2018   Procedure: COLONOSCOPY WITH PROPOFOL ;  Surgeon: Toledo, Ladell POUR, MD;  Location: [facility];  Service: Gastroenterology;  Laterality: N/A;   ESOPHAGOGASTRODUODENOSCOPY (EGD) WITH PROPOFOL  N/A 02/12/2018   Procedure: ESOPHAGOGASTRODUODENOSCOPY (EGD) WITH PROPOFOL ;  Surgeon: Toledo, Ladell POUR, MD;  Location: [facility];  Service: Gastroenterology;  Laterality: N/A;   EYE SURGERY Left 1973   muscle shortening to straighten cross eyes   LAPAROSCOPY N/A 02/20/2018   Procedure: LAPAROSCOPY DIAGNOSTIC, ( RESECTION OF RIGHT LOWER QUADRANT ABDOMINAL MASS);  Surgeon: Nicholaus Selinda Birmingham, MD;  Location:  [facility] OR[facility];  Service: General;  Laterality: N/A;   ROTATOR CUFF REPAIR Left 2011   Patient Active Problem List   Diagnosis Date Noted   Cognitive communication deficit 04/07/2024   Aphasia 04/07/2024   Urinary incontinence as sequela of cerebrovascular accident (CVA) 02/11/2024   Depression with anxiety 01/04/2024   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   Acute CVA (cerebrovascular accident) (HCC) 12/20/2023   Primary hypertension 12/20/2023   Acute kidney injury superimposed on chronic kidney disease 12/20/2023   DM type 2 with diabetic mixed hyperlipidemia (HCC) 11/17/2022   Major depressive disorder, recurrent, 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   B12 deficiency 02/23/2021   SBO (small bowel obstruction) (HCC) 01/29/2019   Barrett's esophagus without dysplasia 04/15/2018   Weight loss 12/06/2017   Hoarse 12/06/2017   Lumbar disc disease 07/26/2017   Diabetes mellitus type 2, controlled, without complications (HCC) 02/09/2015   Surgical menopause 11/26/2014    ONSET DATE: 12/20/2023  REFERRING DIAG: P36.487 (ICD-10-CM) - Left middle cerebral artery stroke (HCC)   THERAPY DIAG:  Muscle weakness (generalized)  Unsteadiness on feet  Abnormality of gait  Rationale for Evaluation and Treatment: Rehabilitation  SUBJECTIVE:  SUBJECTIVE STATEMENT:  Pt reports doing well today. Pt denies any recent falls/stumbles since prior session. Has been working on walking more at home without walker assistance.   PERTINENT HISTORY:Pt states she is not sure the exact date of when her CVA happened because she kept falling at home trying to care for herself and her daughter, but she finally went to the hospital when her sister insisted on it. Pt states her special needs daughter, [name], passed away recently while pt was in  the hospital at [facility] before she was transferred to [facility] for stroke rehabilitation.Pt's sister states pt requires supervision/cuing for ADLs  to don clothes correctly, with correct orientation (potential apraxia?) Pt/sister report pt has R side inattention.Pt reports supposedly one of her legs is shorter than the other, believes her R LE might be the shorter one. 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     PAIN:  Are you having pain? No  PRECAUTIONS: Fall RED FLAGS: None  WEIGHT BEARING RESTRICTIONS: No FALLS: Has patient fallen in last 6 months? Yes. Number of falls 6+ before going to hospital and 1 fall since being home from CIR - pt states she was trying to gather her clothes by herself rather than waiting for her sister to get there to help her  LIVING ENVIRONMENT Lives with: lives alone and but pt's sister, [name], comes over daily to assist with ADLs and stays to provide supervision all day - pt states at night she would be able to walk to bathroom using RW if needed (but she wears depends in event of incontinence) Lives in: House/apartment Stairs: she has steps, but she doesn't have to use them (house was wheelchair accessible for her daughter)  Has following equipment at home: [employer] - 2 wheeled, shower chair, Grab bars, Ramped entry, and transport chair  PLOF: Independent, Independent with household mobility without device, Independent with homemaking with ambulation, Independent with gait, Independent with transfers, and she was the primary caregiver for her recently deceased daughter   CLOF: Sister provides supervision daily for pt to ambulate using RW safely and provides assist for ADLs (showering and dressing - progressing towards supervision with these activities); Sister states pt requires cues to turn and step back fully prior to sitting to ensure her safety  PATIENT GOALS: get back to being able to go  shopping with improved community level ambulation without the walker; return to driving   OBJECTIVE:  Note: Objective measures were completed at evaluation unless otherwise noted.  LOWER EXTREMITY MMT:    MMT Right Eval Left Eval  Hip flexion 3+, no pain 3+ with some L hip and back pain with this  Knee flexion 3+ 4  Knee extension 4- 4  Ankle dorsiflexion 3+ 4  Ankle plantarflexion 4- 4  (Blank rows = not tested)  TREATMENT DATE: 08/20/2024  TA- To improve functional movements patterns for everyday tasks    Gait no AD to outdoor area with 2.5# AW on ( 800 ft)  - 3 rounds of gait over pine straw, sticks, holes, etc for yard simulation- cues for proper performance and route. Rest between sets.2.5# AW donned throughout   Sit to stand 2x10 with 5KG ball held in hands - cues to keep close to chest with arms crossed   NMR: To facilitate reeducation of movement, balance, posture, coordination, and/or proprioception/kinesthetic sense.  Taps from floor to 6 in step to cooresponding hedgehog on step 2 x 10 ea LE   Airex pad: 6 step toe taps 2x10 each LE  Pt required occasional rest breaks due fatigue, PT was attentive to when pt appeared to be tired or winded in order to prevent excessive fatigue.  Unless otherwise stated, CGA was provided and gait belt donned in order to ensure pt safety  PATIENT EDUCATION: Education details: Pt educated on progress note findings Person educated: Patient Education method: Explanation Education comprehension: verbalized understanding   HOME EXERCISE PROGRAM: Access Code: [email or web address] URL: [email or web address] Date: 02/04/2024 Prepared by: Peggye Linear  Exercises - Standing March with Counter Support  - 2 x daily - 7 x weekly - 2 sets - 20 reps - Side stepping with counter support: yellow band loop  - 2 x daily  - 7 x weekly - 2 sets - 20 reps - Sit to Stand with Counter Support  - 2 x daily - 7 x weekly - 2 sets - 12 reps   GOALS: Goals reviewed with patient? Yes  SHORT TERM GOALS: Target date: 03/10/2024  Pt will be independent with HEP in order to improve strength and balance in order to decrease fall risk and improve function at home and work.  Baseline: need to initiate7/16: 2-3x week Goal status:  MET  LONG TERM GOALS: Target date: 10/08/2024    1.  Patient will complete five times sit to stand test in < 15 seconds indicating an increased LE strength and improved balance. Baseline:  38.75 seconds, using UE support, 7/16: 20.35 sec, UE support on 1st STS only 8/20:16.86 sec no UE support 9/10:12.16 sec  Goal Status: MET   2.  Patient will increase ABC scale score >50% to demonstrate better functional mobility and better confidence with ADLs.   Baseline: 7.5%, 7/16: 18.125% 8/20: 18.75% 9/10: 16.25% 11/5: 45% 12/15: 65% Goal status: ONGOING   3.  Patient will increase Berg Balance score by > 6 points to demonstrate decreased fall risk during functional activities. Baseline: need to assess; 01/30/2024= 34/56, 7/16= 39 8/20:42 Goal status: MET   4. Patient will increase 10 meter walk test to >1.16m/s as to improve gait speed for better community ambulation and to reduce fall risk. Baseline: 0.48 m/s using youth RW, requires CGA for safety, 7/16: 0.69 m/s 8/20:  .76 m/s 9/10:.70 m/s 11/05: 11:42 seconds using FWW, .88 m/s 12/15: with Rollator.0.84 m/s and without AD 0.88 m/s Goal status: ONGOING  5. Patient will increase six minute walk test distance to >1000 for progression to community ambulator and improve gait ability Baseline: need to assess; 01/30/2024=410 feet using RW; 02/13/24: 414ft in 75m30s, 7/16: 626ft using 2WW 8/20: 900 ft with RW  9/29: 835 ft with 4WW  11/5: 994 ft with RW 12/12: 997 ft with Rollator Goal status: ONGOING  6.  Patient will improve dynamic gait index score  by 4 points or greater to indicate  improvement in gait with a rolling walker as well as decreased risk for falls Baseline:9/17:10 11/5: 14/24 Goal status: MET  7.  Pt will report 70% or greater confidence when ambulating around her home and completing her usual daily activities with LRAD.  Baseline: 30-50%  11/5: 100%  Goal status: MET  8.  Pt will improve mini-best score by 4 points or more to decrease fall risk during functional activity.1 Baseline: 8; 12/15: 11/28 Goal status: ONGOING    ASSESSMENT:  CLINICAL IMPRESSION:    Patient presents a good motivation for completion of physical therapy activities.  Patient progressing well with functional mobility and strength.  Patient becoming more confident with activities at home and choosing electing to leave walker behind when she feels confident navigating her bathroom or other rooms around her home.  Pt able to demonstrate good fall recovery strategies while ambulating on uneven surfaces outdoors this date but still needs cues for proper safety awareness at times. Pt will continue to benefit from skilled physical therapy intervention to address impairments, improve QOL, and attain therapy goals.    OBJECTIVE IMPAIRMENTS: Abnormal gait, decreased activity tolerance, decreased balance, decreased endurance, decreased knowledge of use of DME, decreased mobility, difficulty walking, decreased strength, decreased safety awareness, impaired vision/preception, and pain.   ACTIVITY LIMITATIONS: carrying, lifting, bending, standing, squatting, stairs, transfers, bed mobility, bathing, toileting, dressing, reach over head, hygiene/grooming, and locomotion level  PARTICIPATION LIMITATIONS: meal prep, cleaning, laundry, driving, shopping, and community activity  PERSONAL FACTORS: Age, Time since onset of injury/illness/exacerbation, and 3+ comorbidities: Hypertension, B12 deficiency, type 2 diabetes, hyperlipidemia, CKD stage IIIa are also affecting  patient's functional outcome.   REHAB POTENTIAL: Good  CLINICAL DECISION MAKING: Evolving/moderate complexity  EVALUATION COMPLEXITY: Moderate  PLAN:  PT FREQUENCY: 1-2x/week  PT DURATION: 8 weeks  PLANNED INTERVENTIONS: [id number]- PT Re-evaluation, 97750- Physical Performance Testing, 97110-Therapeutic exercises, 97530- Therapeutic activity, [id number]- Neuromuscular re-education, 97535- Self Care, [id number]- Manual therapy, [id number]- Gait training, [phone]- Orthotic Initial, [phone]- Orthotic/Prosthetic subsequent, [phone]- Canalith repositioning, [phone]- Electrical stimulation (manual), Patient/Family education, Balance training, Stair training, Taping, Joint mobilization, Spinal mobilization, Vestibular training, Visual/preceptual remediation/compensation, DME instructions, Cryotherapy, Moist heat, and Biofeedback  PLAN FOR NEXT SESSION:   Progress below activities:  Components of Mini-BESTest: -Gait speed, horizontal head turns -Stepping over obstacles -Single leg stance -Balance reactions Obstacle navigation Ambulation on uneven surfaces On and off airex variable direction without UE support ( balance recovery strategies)  Navigating small spaces   Lonni KATHEE Gainer PT ,DPT Physical Therapist- [place]  [facility]    "

## 2024-08-25 ENCOUNTER — Encounter: Admitting: Occupational Therapy

## 2024-08-25 ENCOUNTER — Encounter: Admitting: Speech Pathology

## 2024-08-25 ENCOUNTER — Ambulatory Visit: Admitting: Physical Therapy

## 2024-08-25 DIAGNOSIS — M6281 Muscle weakness (generalized): Secondary | ICD-10-CM | POA: Diagnosis not present

## 2024-08-25 DIAGNOSIS — R269 Unspecified abnormalities of gait and mobility: Secondary | ICD-10-CM

## 2024-08-25 DIAGNOSIS — R2681 Unsteadiness on feet: Secondary | ICD-10-CM

## 2024-08-25 DIAGNOSIS — I69351 Hemiplegia and hemiparesis following cerebral infarction affecting right dominant side: Secondary | ICD-10-CM

## 2024-08-25 NOTE — Therapy (Signed)
 " OUTPATIENT PHYSICAL THERAPY TREATMENT  Patient Name: Haley Jones MRN: [id number] DOB:1955/10/07, 69 y.o., female Today's Date: 08/25/2024   PCP: Dr. Oneil Pinal REFERRING PROVIDER: Toribio Pitch, PA-C  END OF SESSION:   PT End of Session - 08/25/24 1407     Visit Number 58    Number of Visits 64    Date for Recertification  10/08/24    Authorization Type [employer]    Progress Note Due on Visit 60    PT Start Time 1403    PT Stop Time 1443    PT Time Calculation (min) 40 min    Equipment Utilized During Treatment Gait belt    Activity Tolerance Patient tolerated treatment well    Behavior During Therapy WFL for tasks assessed/performed               Past Medical History:  Diagnosis Date   Adnexal mass    Anxiety    Chronic back pain    Depression    Diabetes mellitus without complication (HCC)    Endometriosis 03/12/2018   GERD (gastroesophageal reflux disease)    Heart murmur 02/2018   undetected until she was an adult. 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   Weight loss 12/06/2017   Past Surgical History:  Procedure Laterality Date   ABDOMINAL HYSTERECTOMY  2008   ovaries also   CESAREAN SECTION  1994   COLONOSCOPY     COLONOSCOPY WITH PROPOFOL  N/A 02/12/2018   Procedure: COLONOSCOPY WITH PROPOFOL ;  Surgeon: Toledo, Ladell POUR, MD;  Location: [facility];  Service: Gastroenterology;  Laterality: N/A;   ESOPHAGOGASTRODUODENOSCOPY (EGD) WITH PROPOFOL  N/A 02/12/2018   Procedure: ESOPHAGOGASTRODUODENOSCOPY (EGD) WITH PROPOFOL ;  Surgeon: Toledo, Ladell POUR, MD;  Location: [facility];  Service: Gastroenterology;  Laterality: N/A;   EYE SURGERY Left 1973   muscle shortening to straighten cross eyes   LAPAROSCOPY N/A 02/20/2018   Procedure: LAPAROSCOPY DIAGNOSTIC, ( RESECTION OF RIGHT LOWER QUADRANT ABDOMINAL MASS);  Surgeon: Nicholaus Selinda Birmingham, MD;  Location:  [facility] OR[facility];  Service: General;  Laterality: N/A;   ROTATOR CUFF REPAIR Left 2011   Patient Active Problem List   Diagnosis Date Noted   Cognitive communication deficit 04/07/2024   Aphasia 04/07/2024   Urinary incontinence as sequela of cerebrovascular accident (CVA) 02/11/2024   Depression with anxiety 01/04/2024   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   Acute CVA (cerebrovascular accident) (HCC) 12/20/2023   Primary hypertension 12/20/2023   Acute kidney injury superimposed on chronic kidney disease 12/20/2023   DM type 2 with diabetic mixed hyperlipidemia (HCC) 11/17/2022   Major depressive disorder, recurrent, 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   B12 deficiency 02/23/2021   SBO (small bowel obstruction) (HCC) 01/29/2019   Barrett's esophagus without dysplasia 04/15/2018   Weight loss 12/06/2017   Hoarse 12/06/2017   Lumbar disc disease 07/26/2017   Diabetes mellitus type 2, controlled, without complications (HCC) 02/09/2015   Surgical menopause 11/26/2014    ONSET DATE: 12/20/2023  REFERRING DIAG: P36.487 (ICD-10-CM) - Left middle cerebral artery stroke (HCC)   THERAPY DIAG:  Muscle weakness (generalized)  Unsteadiness on feet  Abnormality of gait  Hemiplegia and hemiparesis following cerebral infarction affecting right dominant side (HCC)  Rationale for Evaluation and Treatment: Rehabilitation  SUBJECTIVE:  SUBJECTIVE STATEMENT:  Pt reports doing well today. Pt denies any recent falls/stumbles since prior session. Has been working on walking more at home without walker assistance. Got up for overnight potty breaks since last visit and did not use walker ( had lights on) showing improved confidence.   PERTINENT HISTORY:Pt states she is not sure the exact date of when her CVA happened because she  kept falling at home trying to care for herself and her daughter, but she finally went to the hospital when her sister insisted on it. Pt states her special needs daughter, [name], passed away recently while pt was in the hospital at [facility] before she was transferred to [facility] for stroke rehabilitation.Pt's sister states pt requires supervision/cuing for ADLs  to don clothes correctly, with correct orientation (potential apraxia?) Pt/sister report pt has R side inattention.Pt reports supposedly one of her legs is shorter than the other, believes her R LE might be the shorter one. 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     PAIN:  Are you having pain? No  PRECAUTIONS: Fall RED FLAGS: None  WEIGHT BEARING RESTRICTIONS: No FALLS: Has patient fallen in last 6 months? Yes. Number of falls 6+ before going to hospital and 1 fall since being home from CIR - pt states she was trying to gather her clothes by herself rather than waiting for her sister to get there to help her  LIVING ENVIRONMENT Lives with: lives alone and but pt's sister, [name], comes over daily to assist with ADLs and stays to provide supervision all day - pt states at night she would be able to walk to bathroom using RW if needed (but she wears depends in event of incontinence) Lives in: House/apartment Stairs: she has steps, but she doesn't have to use them (house was wheelchair accessible for her daughter)  Has following equipment at home: [employer] - 2 wheeled, shower chair, Grab bars, Ramped entry, and transport chair  PLOF: Independent, Independent with household mobility without device, Independent with homemaking with ambulation, Independent with gait, Independent with transfers, and she was the primary caregiver for her recently deceased daughter   CLOF: Sister provides supervision daily for pt to ambulate using RW safely and provides assist for ADLs (showering and  dressing - progressing towards supervision with these activities); Sister states pt requires cues to turn and step back fully prior to sitting to ensure her safety  PATIENT GOALS: get back to being able to go shopping with improved community level ambulation without the walker; return to driving   OBJECTIVE:  Note: Objective measures were completed at evaluation unless otherwise noted.  LOWER EXTREMITY MMT:    MMT Right Eval Left Eval  Hip flexion 3+, no pain 3+ with some L hip and back pain with this  Knee flexion 3+ 4  Knee extension 4- 4  Ankle dorsiflexion 3+ 4  Ankle plantarflexion 4- 4  (Blank rows = not tested)  TREATMENT DATE: 08/25/2024   TA- To improve functional movements patterns for everyday tasks    Gait no AD around various parts of hospital up and down stairs and through areas with other individuals 2.5# AW on  with 1 seated rest break x 25 min   Sit to stand x18 reps with 5KG ball held in hands - cues to keep close to chest with arms crossed   NMR: To facilitate reeducation of movement, balance, posture, coordination, and/or proprioception/kinesthetic sense.  Airex pad stance playing tic tac toe on board with writing at highest available reach with L UE to mimic reaching for high objects x 8 min   Self care: instructed in r shoulder flexion stretch at table x 10 reps per pt request of R shoulder exercise for improved mobility  Pt required occasional rest breaks due fatigue, PT was attentive to when pt appeared to be tired or winded in order to prevent excessive fatigue.  Unless otherwise stated, CGA was provided and gait belt donned in order to ensure pt safety  PATIENT EDUCATION: Education details: Pt educated on progress note findings Person educated: Patient Education method: Explanation Education comprehension: verbalized  understanding   HOME EXERCISE PROGRAM: Access Code: [email or web address] URL: [email or web address] Date: 02/04/2024 Prepared by: Peggye Linear  Exercises - Standing March with Counter Support  - 2 x daily - 7 x weekly - 2 sets - 20 reps - Side stepping with counter support: yellow band loop  - 2 x daily - 7 x weekly - 2 sets - 20 reps - Sit to Stand with Counter Support  - 2 x daily - 7 x weekly - 2 sets - 12 reps   GOALS: Goals reviewed with patient? Yes  SHORT TERM GOALS: Target date: 03/10/2024  Pt will be independent with HEP in order to improve strength and balance in order to decrease fall risk and improve function at home and work.  Baseline: need to initiate7/16: 2-3x week Goal status:  MET  LONG TERM GOALS: Target date: 10/08/2024    1.  Patient will complete five times sit to stand test in < 15 seconds indicating an increased LE strength and improved balance. Baseline:  38.75 seconds, using UE support, 7/16: 20.35 sec, UE support on 1st STS only 8/20:16.86 sec no UE support 9/10:12.16 sec  Goal Status: MET   2.  Patient will increase ABC scale score >50% to demonstrate better functional mobility and better confidence with ADLs.   Baseline: 7.5%, 7/16: 18.125% 8/20: 18.75% 9/10: 16.25% 11/5: 45% 12/15: 65% Goal status: ONGOING   3.  Patient will increase Berg Balance score by > 6 points to demonstrate decreased fall risk during functional activities. Baseline: need to assess; 01/30/2024= 34/56, 7/16= 39 8/20:42 Goal status: MET   4. Patient will increase 10 meter walk test to >1.62m/s as to improve gait speed for better community ambulation and to reduce fall risk. Baseline: 0.48 m/s using youth RW, requires CGA for safety, 7/16: 0.69 m/s 8/20:  .76 m/s 9/10:.70 m/s 11/05: 11:42 seconds using FWW, .88 m/s 12/15: with Rollator.0.84 m/s and without AD 0.88 m/s Goal status: ONGOING  5. Patient will increase six minute walk test distance to >1000 for progression to  community ambulator and improve gait ability Baseline: need to assess; 01/30/2024=410 feet using RW; 02/13/24: 424ft in 31m30s, 7/16: 626ft using 2WW 8/20: 900 ft with RW  9/29: 835 ft with 4WW  11/5: 994 ft with RW 12/12: 997 ft with Rollator Goal status: ONGOING  6.  Patient will improve dynamic gait index score by 4 points or greater to indicate improvement in gait with a rolling walker as well as decreased risk for falls Baseline:9/17:10 11/5: 14/24 Goal status: MET  7.  Pt will report 70% or greater confidence when ambulating around her home and completing her usual daily activities with LRAD.  Baseline: 30-50%  11/5: 100%  Goal status: MET  8.  Pt will improve mini-best score by 4 points or more to decrease fall risk during functional activity.1 Baseline: 8; 12/15: 11/28 Goal status: ONGOING    ASSESSMENT:  CLINICAL IMPRESSION:    Patient presents a good motivation for completion of physical therapy activities.  Patient progressing well with functional mobility and strength.  Patient becoming more confident with activities at home and choosing electing to leave walker behind when she feels confident navigating her bathroom or other rooms around her home.  Pt demonstrated good ability to adapt to environment walking in crowded hospital areas and up and down various stair sets. Pt will continue to benefit from skilled physical therapy intervention to address impairments, improve QOL, and attain therapy goals.    OBJECTIVE IMPAIRMENTS: Abnormal gait, decreased activity tolerance, decreased balance, decreased endurance, decreased knowledge of use of DME, decreased mobility, difficulty walking, decreased strength, decreased safety awareness, impaired vision/preception, and pain.   ACTIVITY LIMITATIONS: carrying, lifting, bending, standing, squatting, stairs, transfers, bed mobility, bathing, toileting, dressing, reach over head, hygiene/grooming, and locomotion level  PARTICIPATION  LIMITATIONS: meal prep, cleaning, laundry, driving, shopping, and community activity  PERSONAL FACTORS: Age, Time since onset of injury/illness/exacerbation, and 3+ comorbidities: Hypertension, B12 deficiency, type 2 diabetes, hyperlipidemia, CKD stage IIIa are also affecting patient's functional outcome.   REHAB POTENTIAL: Good  CLINICAL DECISION MAKING: Evolving/moderate complexity  EVALUATION COMPLEXITY: Moderate  PLAN:  PT FREQUENCY: 1-2x/week  PT DURATION: 8 weeks  PLANNED INTERVENTIONS: [id number]- PT Re-evaluation, 97750- Physical Performance Testing, 97110-Therapeutic exercises, 97530- Therapeutic activity, [id number]- Neuromuscular re-education, 97535- Self Care, [id number]- Manual therapy, [id number]- Gait training, [id number]- Orthotic Initial, [phone]- Orthotic/Prosthetic subsequent, [phone]- Canalith repositioning, [phone]- Electrical stimulation (manual), Patient/Family education, Balance training, Stair training, Taping, Joint mobilization, Spinal mobilization, Vestibular training, Visual/preceptual remediation/compensation, DME instructions, Cryotherapy, Moist heat, and Biofeedback  PLAN FOR NEXT SESSION:   Progress below activities:  Components of Mini-BESTest: -Gait speed, horizontal head turns -Stepping over obstacles -Single leg stance -Balance reactions Obstacle navigation Ambulation on uneven surfaces On and off airex variable direction without UE support ( balance recovery strategies)  Navigating small spaces  Lonni KATHEE Gainer PT ,DPT Physical Therapist- [place]  [facility]    "

## 2024-08-27 ENCOUNTER — Ambulatory Visit

## 2024-08-27 DIAGNOSIS — H543 Unqualified visual loss, both eyes: Secondary | ICD-10-CM

## 2024-08-27 DIAGNOSIS — R41841 Cognitive communication deficit: Secondary | ICD-10-CM

## 2024-08-27 DIAGNOSIS — I69351 Hemiplegia and hemiparesis following cerebral infarction affecting right dominant side: Secondary | ICD-10-CM

## 2024-08-27 DIAGNOSIS — R278 Other lack of coordination: Secondary | ICD-10-CM

## 2024-08-27 DIAGNOSIS — M6281 Muscle weakness (generalized): Secondary | ICD-10-CM

## 2024-08-27 DIAGNOSIS — R4701 Aphasia: Secondary | ICD-10-CM

## 2024-08-27 DIAGNOSIS — I63512 Cerebral infarction due to unspecified occlusion or stenosis of left middle cerebral artery: Secondary | ICD-10-CM

## 2024-08-27 DIAGNOSIS — R269 Unspecified abnormalities of gait and mobility: Secondary | ICD-10-CM

## 2024-08-27 NOTE — Therapy (Signed)
 " OUTPATIENT PHYSICAL THERAPY TREATMENT  Patient Name: Haley Jones MRN: [id number] DOB:May 19, 1956, 69 y.o., female Today's Date: 08/27/2024   PCP: Dr. Oneil Jones REFERRING PROVIDER: Toribio Pitch, PA-C  END OF SESSION:   PT End of Session - 08/27/24 1402     Visit Number 59    Number of Visits 64    Date for Recertification  10/08/24    Authorization Type [employer]    Progress Note Due on Visit 60    PT Start Time 1403    PT Stop Time 1446    PT Time Calculation (min) 43 min    Equipment Utilized During Treatment Gait belt    Activity Tolerance Patient tolerated treatment well    Behavior During Therapy WFL for tasks assessed/performed               Past Medical History:  Diagnosis Date   Adnexal mass    Anxiety    Chronic back pain    Depression    Diabetes mellitus without complication (HCC)    Endometriosis 03/12/2018   GERD (gastroesophageal reflux disease)    Heart murmur 02/2018   undetected until she was an adult. 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   Weight loss 12/06/2017   Past Surgical History:  Procedure Laterality Date   ABDOMINAL HYSTERECTOMY  2008   ovaries also   CESAREAN SECTION  1994   COLONOSCOPY     COLONOSCOPY WITH PROPOFOL  N/A 02/12/2018   Procedure: COLONOSCOPY WITH PROPOFOL ;  Surgeon: Toledo, Ladell POUR, MD;  Location: [facility];  Service: Gastroenterology;  Laterality: N/A;   ESOPHAGOGASTRODUODENOSCOPY (EGD) WITH PROPOFOL  N/A 02/12/2018   Procedure: ESOPHAGOGASTRODUODENOSCOPY (EGD) WITH PROPOFOL ;  Surgeon: Toledo, Ladell POUR, MD;  Location: [facility];  Service: Gastroenterology;  Laterality: N/A;   EYE SURGERY Left 1973   muscle shortening to straighten cross eyes   LAPAROSCOPY N/A 02/20/2018   Procedure: LAPAROSCOPY DIAGNOSTIC, ( RESECTION OF RIGHT LOWER QUADRANT ABDOMINAL MASS);  Surgeon: Nicholaus Selinda Birmingham, MD;  Location:  [facility] ORS;  Service: General;  Laterality: N/A;   ROTATOR CUFF REPAIR Left 2011   Patient Active Problem List   Diagnosis Date Noted   Cognitive communication deficit 04/07/2024   Aphasia 04/07/2024   Urinary incontinence as sequela of cerebrovascular accident (CVA) 02/11/2024   Depression with anxiety 01/04/2024   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   Acute CVA (cerebrovascular accident) (HCC) 12/20/2023   Primary hypertension 12/20/2023   Acute kidney injury superimposed on chronic kidney disease 12/20/2023   DM type 2 with diabetic mixed hyperlipidemia (HCC) 11/17/2022   Major depressive disorder, recurrent, 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   B12 deficiency 02/23/2021   SBO (small bowel obstruction) (HCC) 01/29/2019   Barrett's esophagus without dysplasia 04/15/2018   Weight loss 12/06/2017   Hoarse 12/06/2017   Lumbar disc disease 07/26/2017   Diabetes mellitus type 2, controlled, without complications (HCC) 02/09/2015   Surgical menopause 11/26/2014    ONSET DATE: 12/20/2023  REFERRING DIAG: P36.487 (ICD-10-CM) - Left middle cerebral artery stroke (HCC)   THERAPY DIAG:  Muscle weakness (generalized)  Abnormality of gait  Hemiplegia and hemiparesis following cerebral infarction affecting right dominant side (HCC)  Other lack of coordination  Low vision, both eyes  Cognitive communication deficit  Aphasia  Left middle cerebral artery stroke [facility])  Rationale for Evaluation and Treatment: Rehabilitation  SUBJECTIVE:  SUBJECTIVE STATEMENT:  Pt reports doing well today. Pt denies any recent falls/stumbles since prior session. Has been working on walking more at home without walker assistance. Got up for overnight potty breaks since last visit and did not use walker ( had lights on) showing  improved confidence.   PERTINENT HISTORY:Pt states she is not sure the exact date of when her CVA happened because she kept falling at home trying to care for herself and her daughter, but she finally went to the hospital when her sister insisted on it. Pt states her special needs daughter, Haley Jones, passed away recently while pt was in the hospital at [facility] before she was transferred to [facility] for stroke rehabilitation.Pt's sister states pt requires supervision/cuing for ADLs  to don clothes correctly, with correct orientation (potential apraxia?) Pt/sister report pt has R side inattention.Pt reports supposedly one of her legs is shorter than the other, believes her R LE might be the shorter one. 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     PAIN:  Are you having pain? No  PRECAUTIONS: Fall RED FLAGS: None  WEIGHT BEARING RESTRICTIONS: No FALLS: Has patient fallen in last 6 months? Yes. Number of falls 6+ before going to hospital and 1 fall since being home from CIR - pt states she was trying to gather her clothes by herself rather than waiting for her sister to get there to help her  LIVING ENVIRONMENT Lives with: lives alone and but pt's sister, Haley Jones, comes over daily to assist with ADLs and stays to provide supervision all day - pt states at night she would be able to walk to bathroom using RW if needed (but she wears depends in event of incontinence) Lives in: House/apartment Stairs: she has steps, but she doesn't have to use them (house was wheelchair accessible for her daughter)  Has following equipment at home: [employer]ant - 2 wheeled, shower chair, Grab bars, Ramped entry, and transport chair  PLOF: Independent, Independent with household mobility without device, Independent with homemaking with ambulation, Independent with gait, Independent with transfers, and she was the primary caregiver for her recently deceased daughter    CLOF: Sister provides supervision daily for pt to ambulate using RW safely and provides assist for ADLs (showering and dressing - progressing towards supervision with these activities); Sister states pt requires cues to turn and step back fully prior to sitting to ensure her safety  PATIENT GOALS: get back to being able to go shopping with improved community level ambulation without the walker; return to driving   OBJECTIVE:  Note: Objective measures were completed at evaluation unless otherwise noted.  LOWER EXTREMITY MMT:    MMT Right Eval Left Eval  Hip flexion 3+, no pain 3+ with some L hip and back pain with this  Knee flexion 3+ 4  Knee extension 4- 4  Ankle dorsiflexion 3+ 4  Ankle plantarflexion 4- 4  (Blank rows = not tested)  TREATMENT DATE: 08/27/2024   TA- To improve functional movements patterns for everyday tasks    Gait no AD around various parts of hospital up and down stairs and through areas with other individuals 2.5# AW witihout rest break x15'.    Sit to stand 3x10 reps with 5KG ball held in hands - cues to keep close to chest with arms crossed   NMR: To facilitate reeducation of movement, balance, posture, coordination, and/or proprioception/kinesthetic sense.  Step up 6'' onto airex pad from airex pad base x10 each LE.   Obstacle course: Over 6'' step, on top of 6'' foam step, over rocker board, and over bolster. X3 laps with min. A.   MATRIX cable walk outs forward/backward 2 laps each with 12.3 lb.    Pt required occasional rest breaks due fatigue, PT was attentive to when pt appeared to be tired or winded in order to prevent excessive fatigue.  Unless otherwise stated, CGA was provided and gait belt donned in order to ensure pt safety  PATIENT EDUCATION: Education details: Pt educated on progress note findings Person  educated: Patient Education method: Explanation Education comprehension: verbalized understanding   HOME EXERCISE PROGRAM: Access Code: [email or web address] URL: [email or web address] Date: 02/04/2024 Prepared by: Peggye Linear  Exercises - Standing March with Counter Support  - 2 x daily - 7 x weekly - 2 sets - 20 reps - Side stepping with counter support: yellow band loop  - 2 x daily - 7 x weekly - 2 sets - 20 reps - Sit to Stand with Counter Support  - 2 x daily - 7 x weekly - 2 sets - 12 reps   GOALS: Goals reviewed with patient? Yes  SHORT TERM GOALS: Target date: 03/10/2024  Pt will be independent with HEP in order to improve strength and balance in order to decrease fall risk and improve function at home and work.  Baseline: need to initiate7/16: 2-3x week Goal status:  MET  LONG TERM GOALS: Target date: 10/08/2024    1.  Patient will complete five times sit to stand test in < 15 seconds indicating an increased LE strength and improved balance. Baseline:  38.75 seconds, using UE support, 7/16: 20.35 sec, UE support on 1st STS only 8/20:16.86 sec no UE support 9/10:12.16 sec  Goal Status: MET   2.  Patient will increase ABC scale score >50% to demonstrate better functional mobility and better confidence with ADLs.   Baseline: 7.5%, 7/16: 18.125% 8/20: 18.75% 9/10: 16.25% 11/5: 45% 12/15: 65% Goal status: ONGOING   3.  Patient will increase Berg Balance score by > 6 points to demonstrate decreased fall risk during functional activities. Baseline: need to assess; 01/30/2024= 34/56, 7/16= 39 8/20:42 Goal status: MET   4. Patient will increase 10 meter walk test to >1.63m/s as to improve gait speed for better community ambulation and to reduce fall risk. Baseline: 0.48 m/s using youth RW, requires CGA for safety, 7/16: 0.69 m/s 8/20:  .76 m/s 9/10:.70 m/s 11/05: 11:42 seconds using FWW, .88 m/s 12/15: with Rollator.0.84 m/s and without AD 0.88 m/s Goal status:  ONGOING  5. Patient will increase six minute walk test distance to >1000 for progression to community ambulator and improve gait ability Baseline: need to assess; 01/30/2024=410 feet using RW; 02/13/24: 428ft in 41m30s, 7/16: 623ft using 2WW 8/20: 900 ft with RW  9/29: 835 ft with 4WW  11/5: 994 ft with RW 12/12: 997 ft with Rollator Goal status: ONGOING  6.  Patient will improve  dynamic gait index score by 4 points or greater to indicate improvement in gait with a rolling walker as well as decreased risk for falls Baseline:9/17:10 11/5: 14/24 Goal status: MET  7.  Pt will report 70% or greater confidence when ambulating around her home and completing her usual daily activities with LRAD.  Baseline: 30-50%  11/5: 100%  Goal status: MET  8.  Pt will improve mini-best score by 4 points or more to decrease fall risk during functional activity.1 Baseline: 8; 12/15: 11/28 Goal status: ONGOING    ASSESSMENT:  CLINICAL IMPRESSION:    Patient presents a good motivation for completion of physical therapy activities.  Patient progressing well with functional mobility and strength. She continued with prolonged walking in hospital today in busy areas and up/down stairs without AD. She also continued with dynamic balance performing multiple laps over obstacle course practicing maintaining balance when stepping onto, over, and on top of various surfaces. She also added resisted walking today to simulate walking up/down hills. Overall, she performed well responding positively to verbal cues for positioning/mechanics. Pt will continue to benefit from skilled physical therapy intervention to address impairments, improve QOL, and attain therapy goals.    OBJECTIVE IMPAIRMENTS: Abnormal gait, decreased activity tolerance, decreased balance, decreased endurance, decreased knowledge of use of DME, decreased mobility, difficulty walking, decreased strength, decreased safety awareness, impaired vision/preception,  and pain.   ACTIVITY LIMITATIONS: carrying, lifting, bending, standing, squatting, stairs, transfers, bed mobility, bathing, toileting, dressing, reach over head, hygiene/grooming, and locomotion level  PARTICIPATION LIMITATIONS: meal prep, cleaning, laundry, driving, shopping, and community activity  PERSONAL FACTORS: Age, Time since onset of injury/illness/exacerbation, and 3+ comorbidities: Hypertension, B12 deficiency, type 2 diabetes, hyperlipidemia, CKD stage IIIa are also affecting patient's functional outcome.   REHAB POTENTIAL: Good  CLINICAL DECISION MAKING: Evolving/moderate complexity  EVALUATION COMPLEXITY: Moderate  PLAN:  PT FREQUENCY: 1-2x/week  PT DURATION: 8 weeks  PLANNED INTERVENTIONS: 97164- PT Re-evaluation, 97750- Physical Performance Testing, 97110-Therapeutic exercises, 97530- Therapeutic activity, [id number]- Neuromuscular re-education, 97535- Self Care, [id number]- Manual therapy, [id number]- Gait training, [phone]- Orthotic Initial, [phone]- Orthotic/Prosthetic subsequent, [phone]- Canalith repositioning, [phone]- Electrical stimulation (manual), Patient/Family education, Balance training, Stair training, Taping, Joint mobilization, Spinal mobilization, Vestibular training, Visual/preceptual remediation/compensation, DME instructions, Cryotherapy, Moist heat, and Biofeedback  PLAN FOR NEXT SESSION:   Progress below activities:  Components of Mini-BESTest: -Gait speed, horizontal head turns -Stepping over obstacles -Single leg stance -Balance reactions Obstacle navigation Ambulation on uneven surfaces On and off airex variable direction without UE support ( balance recovery strategies)  Navigating small spaces  Norman KATHEE Sharps PT ,DPT Physical Therapist- [place]  [facility]    "

## 2024-09-01 ENCOUNTER — Encounter: Admitting: Occupational Therapy

## 2024-09-01 ENCOUNTER — Ambulatory Visit: Admitting: Physical Therapy

## 2024-09-01 DIAGNOSIS — I69351 Hemiplegia and hemiparesis following cerebral infarction affecting right dominant side: Secondary | ICD-10-CM

## 2024-09-01 DIAGNOSIS — M6281 Muscle weakness (generalized): Secondary | ICD-10-CM | POA: Diagnosis not present

## 2024-09-01 DIAGNOSIS — H543 Unqualified visual loss, both eyes: Secondary | ICD-10-CM

## 2024-09-01 DIAGNOSIS — R269 Unspecified abnormalities of gait and mobility: Secondary | ICD-10-CM

## 2024-09-01 NOTE — Therapy (Signed)
 " OUTPATIENT PHYSICAL THERAPY TREATMENT/ Physical Therapy Progress Note   Dates of reporting period  07/28/24   to   09/01/24   Patient Name: Haley Jones MRN: [id number] DOB:Jul 28, 1956, 69 y.o., female Today's Date: 09/01/2024   PCP: Dr. Oneil Pinal REFERRING PROVIDER: Toribio Pitch, PA-C  END OF SESSION:   PT End of Session - 09/01/24 1504     Visit Number 60    Number of Visits 70    Date for Recertification  10/08/24    Authorization Type [employer]    Progress Note Due on Visit 60    PT Start Time 1452    PT Stop Time 1530    PT Time Calculation (min) 38 min    Equipment Utilized During Treatment Gait belt    Activity Tolerance Patient tolerated treatment well    Behavior During Therapy WFL for tasks assessed/performed                Past Medical History:  Diagnosis Date   Adnexal mass    Anxiety    Chronic back pain    Depression    Diabetes mellitus without complication (HCC)    Endometriosis 03/12/2018   GERD (gastroesophageal reflux disease)    Heart murmur 02/2018   undetected until she was an adult. 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   Weight loss 12/06/2017   Past Surgical History:  Procedure Laterality Date   ABDOMINAL HYSTERECTOMY  2008   ovaries also   CESAREAN SECTION  1994   COLONOSCOPY     COLONOSCOPY WITH PROPOFOL  N/A 02/12/2018   Procedure: COLONOSCOPY WITH PROPOFOL ;  Surgeon: Toledo, Ladell POUR, MD;  Location: [facility];  Service: Gastroenterology;  Laterality: N/A;   ESOPHAGOGASTRODUODENOSCOPY (EGD) WITH PROPOFOL  N/A 02/12/2018   Procedure: ESOPHAGOGASTRODUODENOSCOPY (EGD) WITH PROPOFOL ;  Surgeon: Toledo, Ladell POUR, MD;  Location: [facility];  Service: Gastroenterology;  Laterality: N/A;   EYE SURGERY Left 1973   muscle shortening to straighten cross eyes   LAPAROSCOPY N/A 02/20/2018   Procedure: LAPAROSCOPY DIAGNOSTIC, (  RESECTION OF RIGHT LOWER QUADRANT ABDOMINAL MASS);  Surgeon: Nicholaus Selinda Birmingham, MD;  Location: [facility];  Service: General;  Laterality: N/A;   ROTATOR CUFF REPAIR Left 2011   Patient Active Problem List   Diagnosis Date Noted   Cognitive communication deficit 04/07/2024   Aphasia 04/07/2024   Urinary incontinence as sequela of cerebrovascular accident (CVA) 02/11/2024   Depression with anxiety 01/04/2024   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   Acute CVA (cerebrovascular accident) (HCC) 12/20/2023   Primary hypertension 12/20/2023   Acute kidney injury superimposed on chronic kidney disease 12/20/2023   DM type 2 with diabetic mixed hyperlipidemia (HCC) 11/17/2022   Major depressive disorder, recurrent, 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   B12 deficiency 02/23/2021   SBO (small bowel obstruction) (HCC) 01/29/2019   Barrett's esophagus without dysplasia 04/15/2018   Weight loss 12/06/2017   Hoarse 12/06/2017   Lumbar disc disease 07/26/2017   Diabetes mellitus type 2, controlled, without complications (HCC) 02/09/2015   Surgical menopause 11/26/2014    ONSET DATE: 12/20/2023  REFERRING DIAG: P36.487 (ICD-10-CM) - Left middle cerebral artery stroke (HCC)   THERAPY DIAG:  Abnormality of gait  Hemiplegia and hemiparesis following cerebral infarction affecting right dominant side (HCC)  Low vision, both eyes  Muscle weakness (generalized)  Rationale for Evaluation and Treatment: Rehabilitation  SUBJECTIVE:  SUBJECTIVE STATEMENT:  Pt reports doing well today. Pt denies any recent falls/stumbles since prior session. Has been working on walking more at home without walker assistance.  Reports she has pretty much been leaving her walker in place at home and doing over the do without it but still using it for  outdoor ambulation and community based activities.  Patient reports she has been doing well with this at home and her confidence has been improving.  PERTINENT HISTORY:Pt states she is not sure the exact date of when her CVA happened because she kept falling at home trying to care for herself and her daughter, but she finally went to the hospital when her sister insisted on it. Pt states her special needs daughter, Haley Jones, passed away recently while pt was in the hospital at [facility] before she was transferred to [facility] for stroke rehabilitation.Pt's sister states pt requires supervision/cuing for ADLs  to don clothes correctly, with correct orientation (potential apraxia?) Pt/sister report pt has R side inattention.Pt reports supposedly one of her legs is shorter than the other, believes her R LE might be the shorter one. 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     PAIN:  Are you having pain? No  PRECAUTIONS: Fall RED FLAGS: None  WEIGHT BEARING RESTRICTIONS: No FALLS: Has patient fallen in last 6 months? Yes. Number of falls 6+ before going to hospital and 1 fall since being home from CIR - pt states she was trying to gather her clothes by herself rather than waiting for her sister to get there to help her  LIVING ENVIRONMENT Lives with: lives alone and but pt's sister, Haley Jones, comes over daily to assist with ADLs and stays to provide supervision all day - pt states at night she would be able to walk to bathroom using RW if needed (but she wears depends in event of incontinence) Lives in: House/apartment Stairs: she has steps, but she doesn't have to use them (house was wheelchair accessible for her daughter)  Has following equipment at home: [employer] - 2 wheeled, shower chair, Grab bars, Ramped entry, and transport chair  PLOF: Independent, Independent with household mobility without device, Independent with homemaking with  ambulation, Independent with gait, Independent with transfers, and she was the primary caregiver for her recently deceased daughter   CLOF: Sister provides supervision daily for pt to ambulate using RW safely and provides assist for ADLs (showering and dressing - progressing towards supervision with these activities); Sister states pt requires cues to turn and step back fully prior to sitting to ensure her safety  PATIENT GOALS: get back to being able to go shopping with improved community level ambulation without the walker; return to driving   OBJECTIVE:  Note: Objective measures were completed at evaluation unless otherwise noted.  LOWER EXTREMITY MMT:    MMT Right Eval Left Eval  Hip flexion 3+, no pain 3+ with some L hip and back pain with this  Knee flexion 3+ 4  Knee extension 4- 4  Ankle dorsiflexion 3+ 4  Ankle plantarflexion 4- 4  (Blank rows = not tested)  TREATMENT DATE: 09/01/2024  ABC scale: The Activities-Specific Balance Confidence (ABC) Scale   No confidence<->completely confident     How confident are you that you will not lose your balance or become unsteady when you . . .       Date tested 09/01/24   1: Walk around the house 95   2. Walk up or down stairs 80   3. Bend over and pick up a slipper from in front of a closet floor 95   4. Reach for a small can off a shelf at eye level 90   5. Stand on tip toes and reach for something above your head 85   6. Stand on a chair and reach for something 75   7. Sweep the floor 85   8. Walk outside the house to a car parked in the driveway 80   9. Get into or out of a car 90   10. Walk across a parking lot to the mall 75   11. Walk up or down a ramp 95   12. Walk in a crowded mall where people rapidly walk past you 75   13. Are bumped into by people as you walk through the mall 75   14. Step onto  or off of an escalator while you are holding onto the railing 0   15. Step onto or off an escalator while holding onto parcels such that you cannot hold onto the railing 0   16. Walk outside on icy sidewalks 75   Total: #/16 1170 73.125   Physical Performance Test or Measurement: a  physical performance test(s) or measurement (eg,  musculoskeletal, functional capacity), with written report,  each 15 mins   Pt scores 18 / 28 on mini BEST balance test. Scores < 16 indicate increased risk for falls Mayer, Sanford, & Alton) and MCID is 4 (Godi,et al, 2013)    [facility] PT Assessment - 09/01/24 0001       Mini-BESTest   Sit To Stand Normal: Comes to stand without use of hands and stabilizes independently. (P)     Rise to Toes Moderate: Heels up, but not full range (smaller than when holding hands), OR noticeable instability for 3 s. (P)     Stand on one leg (left) Moderate: < 20 s (P)     Stand on one leg (right) Moderate: < 20 s (P)     Stand on one leg - lowest score 1 (P)     Compensatory Stepping Correction - Forward Moderate: More than one step is required to recover equilibrium (P)     Compensatory Stepping Correction - Backward Moderate: More than one step is required to recover equilibrium (P)     Compensatory Stepping Correction - Left Lateral Severe: Falls, or cannot step (P)     Compensatory Stepping Correction - Right Lateral Moderate: Several steps to recover equilibrium (P)     Stepping Corredtion Lateral - lowest score 0 (P)     Stance - Feet together, eyes open, firm surface  Normal: 30s (P)     Stance - Feet together, eyes closed, foam surface  Normal: 30s (P)     Incline - Eyes Closed Normal: Stands independently 30s and aligns with gravity (P)     Change in Gait Speed Normal: Significantly changes walkling speed without imbalance (P)     Walk with head turns - Horizontal Normal: performs head turns with no change in gait speed and good balance (P)  Walk with pivot turns  Moderate:Turns with feet close SLOW (>4 steps) with good balance. (P)     Step over obstacles Moderate: Steps over box but touches box OR displays cautious behavior by slowing gait. (P)     Timed UP & GO with Dual Task Severe: Stops counting while walking OR stops walking while counting. (P)    TUG12.72 DT TUG: cannot count backwards   Mini-BEST total score 18 (P)           6 Min Walk Test:  Instructed patient to ambulate as quickly and as safely as possible for 6 minutes using LRAD. Patient was allowed to take standing rest breaks without stopping the test, but if the patient required a sitting rest break the clock would be stopped and the test would be over.  Results: 1127 feet using a 4WW. Results indicate that the patient has reduced endurance with ambulation compared to age matched norms.  Age Matched Norms (in meters): 23-69 yo M: 47 F: 47, 100-79 yo M: 74 F: 471, 23-89 yo M: 417 F: 392 MDC: 58.21 meters (190.98 feet) or 50 meters (ANPTA Core Set of Outcome Measures for Adults with Neurologic Conditions, 2018)    Pt required occasional rest breaks due fatigue, PT was attentive to when pt appeared to be tired or winded in order to prevent excessive fatigue.  Unless otherwise stated, CGA was provided and gait belt donned in order to ensure pt safety  PATIENT EDUCATION: Education details: Pt educated on progress note findings Person educated: Patient Education method: Explanation Education comprehension: verbalized understanding   HOME EXERCISE PROGRAM: Access Code: [email or web address] URL: [email or web address] Date: 02/04/2024 Prepared by: Peggye Linear  Exercises - Standing March with Counter Support  - 2 x daily - 7 x weekly - 2 sets - 20 reps - Side stepping with counter support: yellow band loop  - 2 x daily - 7 x weekly - 2 sets - 20 reps - Sit to Stand with Counter Support  - 2 x daily - 7 x weekly - 2 sets - 12 reps   GOALS: Goals reviewed with patient?  Yes  SHORT TERM GOALS: Target date: 03/10/2024  Pt will be independent with HEP in order to improve strength and balance in order to decrease fall risk and improve function at home and work.  Baseline: need to initiate7/16: 2-3x week Goal status:  MET  LONG TERM GOALS: Target date: 10/08/2024    1.  Patient will complete five times sit to stand test in < 15 seconds indicating an increased LE strength and improved balance. Baseline:  38.75 seconds, using UE support, 7/16: 20.35 sec, UE support on 1st STS only 8/20:16.86 sec no UE support 9/10:12.16 sec  Goal Status: MET   2.  Patient will increase ABC scale score >50% to demonstrate better functional mobility and better confidence with ADLs.   Baseline: 7.5%, 7/16: 18.125% 8/20: 18.75% 9/10: 16.25% 11/5: 45% 12/15: 65% 1/19:73.15% Goal status: MET   3.  Patient will increase Berg Balance score by > 6 points to demonstrate decreased fall risk during functional activities. Baseline: need to assess; 01/30/2024= 34/56, 7/16= 39 8/20:42 Goal status: MET   4. Patient will increase 10 meter walk test to >1.66m/s as to improve gait speed for better community ambulation and to reduce fall risk. Baseline: 0.48 m/s using youth RW, requires CGA for safety, 7/16: 0.69 m/s 8/20:  .76 m/s 9/10:.70 m/s 11/05: 11:42 seconds using FWW, .88 m/s 12/15: with Rollator.0.84 m/s and  without AD 0.88 m/s Goal status: ONGOING  5. Patient will increase six minute walk test distance to >1000 for progression to community ambulator and improve gait ability Baseline: need to assess; 01/30/2024=410 feet using RW; 02/13/24: 450ft in 37m30s, 7/16: 674ft using 2WW 8/20: 900 ft with RW  9/29: 835 ft with 4WW  11/5: 994 ft with RW 12/12: 997 ft with Rollator 1/19: Goal status: MET  6.  Patient will improve dynamic gait index score by 4 points or greater to indicate improvement in gait with a rolling walker as well as decreased risk for falls Baseline:9/17:10 11/5:  14/24 Goal status: MET  7.  Pt will report 70% or greater confidence when ambulating around her home and completing her usual daily activities with LRAD.  Baseline: 30-50%  11/5: 100%  Goal status: MET  8.  Pt will improve mini-best score by 4 points or more to decrease fall risk during functional activity.1 Baseline: 8; 12/15: 11/28 1/19:18 Goal status: MET    ASSESSMENT:  CLINICAL IMPRESSION:    Patient continues to demonstrate great progress toward her long-term goals meeting several of her goals this date.  Will continue to progress toward all long-term goals and including improving gait speed as well as continuing to work on dynamic balance challenges that still increase patient's risk of falls.  Patient will likely discharge in a certification period in about 4 weeks as long as everything goes according to current plan and trajectory.Patient's condition has the potential to improve in response to therapy. Maximum improvement is yet to be obtained. The anticipated improvement is attainable and reasonable in a generally predictable time. Pt will continue to benefit from skilled physical therapy intervention to address impairments, improve QOL, and attain therapy goals.     OBJECTIVE IMPAIRMENTS: Abnormal gait, decreased activity tolerance, decreased balance, decreased endurance, decreased knowledge of use of DME, decreased mobility, difficulty walking, decreased strength, decreased safety awareness, impaired vision/preception, and pain.   ACTIVITY LIMITATIONS: carrying, lifting, bending, standing, squatting, stairs, transfers, bed mobility, bathing, toileting, dressing, reach over head, hygiene/grooming, and locomotion level  PARTICIPATION LIMITATIONS: meal prep, cleaning, laundry, driving, shopping, and community activity  PERSONAL FACTORS: Age, Time since onset of injury/illness/exacerbation, and 3+ comorbidities: Hypertension, B12 deficiency, type 2 diabetes, hyperlipidemia, CKD  stage IIIa are also affecting patient's functional outcome.   REHAB POTENTIAL: Good  CLINICAL DECISION MAKING: Evolving/moderate complexity  EVALUATION COMPLEXITY: Moderate  PLAN:  PT FREQUENCY: 1-2x/week  PT DURATION: 8 weeks  PLANNED INTERVENTIONS: [id number]- PT Re-evaluation, 97750- Physical Performance Testing, 97110-Therapeutic exercises, 97530- Therapeutic activity, [id number]- Neuromuscular re-education, 97535- Self Care, [id number]- Manual therapy, [id number]- Gait training, [phone]- Orthotic Initial, [phone]- Orthotic/Prosthetic subsequent, [phone]- Canalith repositioning, [phone]- Electrical stimulation (manual), Patient/Family education, Balance training, Stair training, Taping, Joint mobilization, Spinal mobilization, Vestibular training, Visual/preceptual remediation/compensation, DME instructions, Cryotherapy, Moist heat, and Biofeedback  PLAN FOR NEXT SESSION:   Progress below activities:  Components of Mini-BESTest: -Gait speed, horizontal head turns -Stepping over obstacles -Single leg stance -Balance reactions Obstacle navigation Ambulation on uneven surfaces On and off airex variable direction without UE support ( balance recovery strategies)  Navigating small spaces  Lonni KATHEE Gainer PT ,DPT Physical Therapist- [place]  [facility]    "

## 2024-09-03 ENCOUNTER — Ambulatory Visit: Admitting: Physical Therapy

## 2024-09-03 ENCOUNTER — Encounter: Admitting: Occupational Therapy

## 2024-09-03 DIAGNOSIS — I69351 Hemiplegia and hemiparesis following cerebral infarction affecting right dominant side: Secondary | ICD-10-CM

## 2024-09-03 DIAGNOSIS — R269 Unspecified abnormalities of gait and mobility: Secondary | ICD-10-CM

## 2024-09-03 DIAGNOSIS — M6281 Muscle weakness (generalized): Secondary | ICD-10-CM | POA: Diagnosis not present

## 2024-09-03 DIAGNOSIS — H543 Unqualified visual loss, both eyes: Secondary | ICD-10-CM

## 2024-09-03 NOTE — Therapy (Addendum)
 " OUTPATIENT PHYSICAL THERAPY TREATMENT  Patient Name: Haley Jones MRN: [id number] DOB:02/05/1956, 69 y.o., female Today's Date: 09/03/2024   PCP: Dr. Oneil Pinal REFERRING PROVIDER: Toribio Pitch, PA-C  END OF SESSION:   PT End of Session - 09/03/24 1400     Visit Number 61    Number of Visits 70    Date for Recertification  10/08/24    Authorization Type [employer]    Progress Note Due on Visit 70    PT Start Time 1402    PT Stop Time 1442    PT Time Calculation (min) 40 min    Equipment Utilized During Treatment Gait belt    Activity Tolerance Patient tolerated treatment well    Behavior During Therapy WFL for tasks assessed/performed                Past Medical History:  Diagnosis Date   Adnexal mass    Anxiety    Chronic back pain    Depression    Diabetes mellitus without complication (HCC)    Endometriosis 03/12/2018   GERD (gastroesophageal reflux disease)    Heart murmur 02/2018   undetected until she was an adult. 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   Weight loss 12/06/2017   Past Surgical History:  Procedure Laterality Date   ABDOMINAL HYSTERECTOMY  2008   ovaries also   CESAREAN SECTION  1994   COLONOSCOPY     COLONOSCOPY WITH PROPOFOL  N/A 02/12/2018   Procedure: COLONOSCOPY WITH PROPOFOL ;  Surgeon: Toledo, Ladell POUR, MD;  Location: [facility];  Service: Gastroenterology;  Laterality: N/A;   ESOPHAGOGASTRODUODENOSCOPY (EGD) WITH PROPOFOL  N/A 02/12/2018   Procedure: ESOPHAGOGASTRODUODENOSCOPY (EGD) WITH PROPOFOL ;  Surgeon: Toledo, Ladell POUR, MD;  Location: [facility];  Service: Gastroenterology;  Laterality: N/A;   EYE SURGERY Left 1973   muscle shortening to straighten cross eyes   LAPAROSCOPY N/A 02/20/2018   Procedure: LAPAROSCOPY DIAGNOSTIC, ( RESECTION OF RIGHT LOWER QUADRANT ABDOMINAL MASS);  Surgeon: Nicholaus Selinda Birmingham, MD;   Location: [facility];  Service: General;  Laterality: N/A;   ROTATOR CUFF REPAIR Left 2011   Patient Active Problem List   Diagnosis Date Noted   Cognitive communication deficit 04/07/2024   Aphasia 04/07/2024   Urinary incontinence as sequela of cerebrovascular accident (CVA) 02/11/2024   Depression with anxiety 01/04/2024   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   Acute CVA (cerebrovascular accident) (HCC) 12/20/2023   Primary hypertension 12/20/2023   Acute kidney injury superimposed on chronic kidney disease 12/20/2023   DM type 2 with diabetic mixed hyperlipidemia (HCC) 11/17/2022   Major depressive disorder, recurrent, 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   B12 deficiency 02/23/2021   SBO (small bowel obstruction) (HCC) 01/29/2019   Barrett's esophagus without dysplasia 04/15/2018   Weight loss 12/06/2017   Hoarse 12/06/2017   Lumbar disc disease 07/26/2017   Diabetes mellitus type 2, controlled, without complications (HCC) 02/09/2015   Surgical menopause 11/26/2014    ONSET DATE: 12/20/2023  REFERRING DIAG: P36.487 (ICD-10-CM) - Left middle cerebral artery stroke (HCC)   THERAPY DIAG:  Abnormality of gait  Hemiplegia and hemiparesis following cerebral infarction affecting right dominant side (HCC)  Low vision, both eyes  Rationale for Evaluation and Treatment: Rehabilitation  SUBJECTIVE:  SUBJECTIVE STATEMENT:  Pt reports doing well today. Pt denies any recent falls/stumbles since prior session. Has been working on walking more at home without walker assistance.  Reports she has pretty much been leaving her walker in place at home and doing over the do without it but still using it for outdoor ambulation and community based activities.  Patient reports she has been doing well with this at home and her  confidence has been improving.  PERTINENT HISTORY:Pt states she is not sure the exact date of when her CVA happened because she kept falling at home trying to care for herself and her daughter, but she finally went to the hospital when her sister insisted on it. Pt states her special needs daughter, Haley Jones, passed away recently while pt was in the hospital at [facility] before she was transferred to [facility] for stroke rehabilitation.Pt's sister states pt requires supervision/cuing for ADLs  to don clothes correctly, with correct orientation (potential apraxia?) Pt/sister report pt has R side inattention.Pt reports supposedly one of her legs is shorter than the other, believes her R LE might be the shorter one. 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     PAIN:  Are you having pain? No  PRECAUTIONS: Fall RED FLAGS: None  WEIGHT BEARING RESTRICTIONS: No FALLS: Has patient fallen in last 6 months? Yes. Number of falls 6+ before going to hospital and 1 fall since being home from [facility] - pt states she was trying to gather her clothes by herself rather than waiting for her sister to get there to help her  LIVING ENVIRONMENT Lives with: lives alone and but pt's sister, Haley Jones, comes over daily to assist with ADLs and stays to provide supervision all day - pt states at night she would be able to walk to bathroom using RW if needed (but she wears depends in event of incontinence) Lives in: House/apartment Stairs: she has steps, but she doesn't have to use them (house was wheelchair accessible for her daughter)  Has following equipment at home: [employer] - 2 wheeled, shower chair, Grab bars, Ramped entry, and transport chair  PLOF: Independent, Independent with household mobility without device, Independent with homemaking with ambulation, Independent with gait, Independent with transfers, and she was the primary caregiver for her recently deceased  daughter   CLOF: Sister provides supervision daily for pt to ambulate using RW safely and provides assist for ADLs (showering and dressing - progressing towards supervision with these activities); Sister states pt requires cues to turn and step back fully prior to sitting to ensure her safety  PATIENT GOALS: get back to being able to go shopping with improved community level ambulation without the walker; return to driving   OBJECTIVE:  Note: Objective measures were completed at evaluation unless otherwise noted.  LOWER EXTREMITY MMT:    MMT Right Eval Left Eval  Hip flexion 3+, no pain 3+ with some L hip and back pain with this  Knee flexion 3+ 4  Knee extension 4- 4  Ankle dorsiflexion 3+ 4  Ankle plantarflexion 4- 4  (Blank rows = not tested)  TREATMENT DATE: 09/03/2024     TA- To improve functional movements patterns for everyday tasks   Donned lite gait harness - LiteGait harness applied with patient in standing position. Straps secured snugly around torso and legs per manufacturer guidelines. Checked for proper alignment, comfort, and safety prior to standing and gait training. All buckles and fasteners verified secure; weight-bearing support adjusted to patient tolerance  Gait training - activities focussed on specific components of gait cycle and multimodal cueing for completion  Lite gait treadmill training for targeted gait speed 2 - 2.3 mph ( 58m/s at around 2.2 mph) to target gait speed as this continues to be an area of limitation x 5 min x 3 rounds with 2 min rest between sets  -first round 2-2.2 MPH going up from 22.2 over first 2 minutes to adapt to treadmill  - cues for cadence and foot clearance  -second set all 2.2 mph  -last set 2.3 mph  - no significant support form treadmill system, mostly safety harness to prevent loss and improve  confidence to try uncomfortable walking speeds  Doffed lite gait harness- LiteGait harness removed with patient in standing position. Due to increased abdominal pressure from harness, pt observed for dizziness post removal before transitioning to sitting for recovery.    NMR: To facilitate reeducation of movement, balance, posture, coordination, and/or proprioception/kinesthetic sense.  2 rounds of sidestepping on and off airex, intermittent UE support due to significant imbalance, encouraged to not rely heavily on UE assist due to tendency to rely on UE despite only minor balance disruptions. 2 x 10 reps with rest between sets due to fatigue.     PATIENT EDUCATION: Education details: Pt educated on progress note findings Person educated: Patient Education method: Explanation Education comprehension: verbalized understanding   HOME EXERCISE PROGRAM: Access Code: [email or web address] URL: [email or web address] Date: 02/04/2024 Prepared by: Peggye Linear  Exercises - Standing March with Counter Support  - 2 x daily - 7 x weekly - 2 sets - 20 reps - Side stepping with counter support: yellow band loop  - 2 x daily - 7 x weekly - 2 sets - 20 reps - Sit to Stand with Counter Support  - 2 x daily - 7 x weekly - 2 sets - 12 reps   GOALS: Goals reviewed with patient? Yes  SHORT TERM GOALS: Target date: 03/10/2024  Pt will be independent with HEP in order to improve strength and balance in order to decrease fall risk and improve function at home and work.  Baseline: need to initiate7/16: 2-3x week Goal status:  MET  LONG TERM GOALS: Target date: 10/08/2024    1.  Patient will complete five times sit to stand test in < 15 seconds indicating an increased LE strength and improved balance. Baseline:  38.75 seconds, using UE support, 7/16: 20.35 sec, UE support on 1st STS only 8/20:16.86 sec no UE support 9/10:12.16 sec  Goal Status: MET   2.  Patient will increase ABC scale score >50%  to demonstrate better functional mobility and better confidence with ADLs.   Baseline: 7.5%, 7/16: 18.125% 8/20: 18.75% 9/10: 16.25% 11/5: 45% 12/15: 65% 1/19:73.15% Goal status: MET   3.  Patient will increase Berg Balance score by > 6 points to demonstrate decreased fall risk during functional activities. Baseline: need to assess; 01/30/2024= 34/56, 7/16= 39 8/20:42 Goal status: MET   4. Patient will increase 10 meter walk test to >1.65m/s as to improve gait speed for better community ambulation and to reduce  fall risk. Baseline: 0.48 m/s using youth RW, requires CGA for safety, 7/16: 0.69 m/s 8/20:  .76 m/s 9/10:.70 m/s 11/05: 11:42 seconds using FWW, .88 m/s 12/15: with Rollator.0.84 m/s and without AD 0.88 m/s Goal status: ONGOING  5. Patient will increase six minute walk test distance to >1000 for progression to community ambulator and improve gait ability Baseline: need to assess; 01/30/2024=410 feet using RW; 02/13/24: 462ft in 9m30s, 7/16: 637ft using 2WW 8/20: 900 ft with RW  9/29: 835 ft with 4WW  11/5: 994 ft with RW 12/12: 997 ft with Rollator 1/19: Goal status: MET  6.  Patient will improve dynamic gait index score by 4 points or greater to indicate improvement in gait with a rolling walker as well as decreased risk for falls Baseline:9/17:10 11/5: 14/24 Goal status: MET  7.  Pt will report 70% or greater confidence when ambulating around her home and completing her usual daily activities with LRAD.  Baseline: 30-50%  11/5: 100%  Goal status: MET  8.  Pt will improve mini-best score by 4 points or more to decrease fall risk during functional activity.1 Baseline: 8; 12/15: 11/28 1/19:18 Goal status: MET    ASSESSMENT:  CLINICAL IMPRESSION:    The patient is a 69 year old female following a cerebrovascular accident who presents with ongoing gait impairments, imbalance, reduced gait speed, and reliance on upper extremity support during dynamic tasks. She  tolerated LiteGait treadmill training at progressively increased speeds up to approximately 2.3 miles per hour with minimal physical support from the system, relying primarily on the safety harness for confidence while practicing gait at speeds that challenge her current capacity. She required multimodal cueing to address cadence and foot clearance, and demonstrated significant imbalance during neuromuscular reeducation tasks such as sidestepping on and off an Airex surface, with a notable tendency to over rely on upper extremity assistance despite only mild balance perturbations. Continued physical therapy remains beneficial to address her persistent gait inefficiencies, impaired dynamic balance, and limited ability to ambulate safely at functionally necessary speeds. Ongoing skilled intervention will support improved neuromuscular control, increased walking confidence, and more efficient movement strategies that reduce fall risk and enhance overall mobility and independence in daily activities.    OBJECTIVE IMPAIRMENTS: Abnormal gait, decreased activity tolerance, decreased balance, decreased endurance, decreased knowledge of use of DME, decreased mobility, difficulty walking, decreased strength, decreased safety awareness, impaired vision/preception, and pain.   ACTIVITY LIMITATIONS: carrying, lifting, bending, standing, squatting, stairs, transfers, bed mobility, bathing, toileting, dressing, reach over head, hygiene/grooming, and locomotion level  PARTICIPATION LIMITATIONS: meal prep, cleaning, laundry, driving, shopping, and community activity  PERSONAL FACTORS: Age, Time since onset of injury/illness/exacerbation, and 3+ comorbidities: Hypertension, B12 deficiency, type 2 diabetes, hyperlipidemia, CKD stage IIIa are also affecting patient's functional outcome.   REHAB POTENTIAL: Good  CLINICAL DECISION MAKING: Evolving/moderate complexity  EVALUATION COMPLEXITY: Moderate  PLAN:  PT  FREQUENCY: 1-2x/week  PT DURATION: 8 weeks  PLANNED INTERVENTIONS: [id number]- PT Re-evaluation, 97750- Physical Performance Testing, 97110-Therapeutic exercises, 97530- Therapeutic activity, [id number]- Neuromuscular re-education, 97535- Self Care, [id number]- Manual therapy, [id number]- Gait training, [phone]- Orthotic Initial, [phone]- Orthotic/Prosthetic subsequent, [phone]- Canalith repositioning, [phone]- Electrical stimulation (manual), Patient/Family education, Balance training, Stair training, Taping, Joint mobilization, Spinal mobilization, Vestibular training, Visual/preceptual remediation/compensation, DME instructions, Cryotherapy, Moist heat, and Biofeedback  PLAN FOR NEXT SESSION:   Progress below activities:  Components of Mini-BESTest: -Gait speed, horizontal head turns -Stepping over obstacles -Single leg stance -Balance reactions Obstacle navigation Ambulation on uneven surfaces On  and off airex variable direction without UE support ( balance recovery strategies)  Navigating small spaces  Lonni KATHEE Gainer PT ,DPT Physical Therapist- [place]  [facility]    "

## 2024-09-08 ENCOUNTER — Encounter: Admitting: Occupational Therapy

## 2024-09-08 ENCOUNTER — Ambulatory Visit: Admitting: Physical Therapy

## 2024-09-10 ENCOUNTER — Encounter: Admitting: Occupational Therapy

## 2024-09-10 ENCOUNTER — Ambulatory Visit: Admitting: Physical Therapy

## 2024-09-10 DIAGNOSIS — R269 Unspecified abnormalities of gait and mobility: Secondary | ICD-10-CM

## 2024-09-10 DIAGNOSIS — M6281 Muscle weakness (generalized): Secondary | ICD-10-CM

## 2024-09-10 DIAGNOSIS — H543 Unqualified visual loss, both eyes: Secondary | ICD-10-CM

## 2024-09-10 DIAGNOSIS — I69351 Hemiplegia and hemiparesis following cerebral infarction affecting right dominant side: Secondary | ICD-10-CM

## 2024-09-10 NOTE — Therapy (Signed)
 " OUTPATIENT PHYSICAL THERAPY TREATMENT  Patient Name: Satonya Lux MRN: [id number] DOB:11-02-1955, 69 y.o., female Today's Date: 09/10/2024   PCP: Dr. Oneil Pinal REFERRING PROVIDER: Toribio Pitch, PA-C  END OF SESSION:   PT End of Session - 09/10/24 1405     Visit Number 62    Number of Visits 70    Date for Recertification  10/08/24    Authorization Type [employer]    Progress Note Due on Visit 70    PT Start Time 1403    PT Stop Time 1442    PT Time Calculation (min) 39 min    Equipment Utilized During Treatment Gait belt    Activity Tolerance Patient tolerated treatment well    Behavior During Therapy WFL for tasks assessed/performed                 Past Medical History:  Diagnosis Date   Adnexal mass    Anxiety    Chronic back pain    Depression    Diabetes mellitus without complication (HCC)    Endometriosis 03/12/2018   GERD (gastroesophageal reflux disease)    Heart murmur 02/2018   undetected until she was an adult. 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   Weight loss 12/06/2017   Past Surgical History:  Procedure Laterality Date   ABDOMINAL HYSTERECTOMY  2008   ovaries also   CESAREAN SECTION  1994   COLONOSCOPY     COLONOSCOPY WITH PROPOFOL  N/A 02/12/2018   Procedure: COLONOSCOPY WITH PROPOFOL ;  Surgeon: Toledo, Ladell POUR, MD;  Location: [facility];  Service: Gastroenterology;  Laterality: N/A;   ESOPHAGOGASTRODUODENOSCOPY (EGD) WITH PROPOFOL  N/A 02/12/2018   Procedure: ESOPHAGOGASTRODUODENOSCOPY (EGD) WITH PROPOFOL ;  Surgeon: Toledo, Ladell POUR, MD;  Location: [facility];  Service: Gastroenterology;  Laterality: N/A;   EYE SURGERY Left 1973   muscle shortening to straighten cross eyes   LAPAROSCOPY N/A 02/20/2018   Procedure: LAPAROSCOPY DIAGNOSTIC, ( RESECTION OF RIGHT LOWER QUADRANT ABDOMINAL MASS);  Surgeon: Nicholaus Selinda Birmingham, MD;   Location: [facility];  Service: General;  Laterality: N/A;   ROTATOR CUFF REPAIR Left 2011   Patient Active Problem List   Diagnosis Date Noted   Cognitive communication deficit 04/07/2024   Aphasia 04/07/2024   Urinary incontinence as sequela of cerebrovascular accident (CVA) 02/11/2024   Depression with anxiety 01/04/2024   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   Acute CVA (cerebrovascular accident) (HCC) 12/20/2023   Primary hypertension 12/20/2023   Acute kidney injury superimposed on chronic kidney disease 12/20/2023   DM type 2 with diabetic mixed hyperlipidemia (HCC) 11/17/2022   Major depressive disorder, recurrent, 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   B12 deficiency 02/23/2021   SBO (small bowel obstruction) (HCC) 01/29/2019   Barrett's esophagus without dysplasia 04/15/2018   Weight loss 12/06/2017   Hoarse 12/06/2017   Lumbar disc disease 07/26/2017   Diabetes mellitus type 2, controlled, without complications (HCC) 02/09/2015   Surgical menopause 11/26/2014    ONSET DATE: 12/20/2023  REFERRING DIAG: P36.487 (ICD-10-CM) - Left middle cerebral artery stroke (HCC)   THERAPY DIAG:  Abnormality of gait  Hemiplegia and hemiparesis following cerebral infarction affecting right dominant side (HCC)  Low vision, both eyes  Muscle weakness (generalized)  Rationale for Evaluation and Treatment: Rehabilitation  SUBJECTIVE:  SUBJECTIVE STATEMENT:  Pt reports doing well today. Pt denies any recent falls/stumbles since prior session. Has been working on walking more at home without walker assistance.  Reports she has pretty much been leaving her walker in place at home and doing over the do without it but still using it for outdoor ambulation and community based activities.  Patient reports she has been  doing well with this at home and her confidence has been improving.  PERTINENT HISTORY:Pt states she is not sure the exact date of when her CVA happened because she kept falling at home trying to care for herself and her daughter, but she finally went to the hospital when her sister insisted on it. Pt states her special needs daughter, [name], passed away recently while pt was in the hospital at [facility] before she was transferred to [facility] for stroke rehabilitation.Pt's sister states pt requires supervision/cuing for ADLs  to don clothes correctly, with correct orientation (potential apraxia?) Pt/sister report pt has R side inattention.Pt reports supposedly one of her legs is shorter than the other, believes her R LE might be the shorter one. 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     PAIN:  Are you having pain? No  PRECAUTIONS: Fall RED FLAGS: None  WEIGHT BEARING RESTRICTIONS: No FALLS: Has patient fallen in last 6 months? Yes. Number of falls 6+ before going to hospital and 1 fall since being home from CIR - pt states she was trying to gather her clothes by herself rather than waiting for her sister to get there to help her  LIVING ENVIRONMENT Lives with: lives alone and but pt's sister, [name], comes over daily to assist with ADLs and stays to provide supervision all day - pt states at night she would be able to walk to bathroom using RW if needed (but she wears depends in event of incontinence) Lives in: House/apartment Stairs: she has steps, but she doesn't have to use them (house was wheelchair accessible for her daughter)  Has following equipment at home: Environmental Consultant - 2 wheeled, shower chair, Grab bars, Ramped entry, and transport chair  PLOF: Independent, Independent with household mobility without device, Independent with homemaking with ambulation, Independent with gait, Independent with transfers, and she was the primary  caregiver for her recently deceased daughter   CLOF: Sister provides supervision daily for pt to ambulate using RW safely and provides assist for ADLs (showering and dressing - progressing towards supervision with these activities); Sister states pt requires cues to turn and step back fully prior to sitting to ensure her safety  PATIENT GOALS: get back to being able to go shopping with improved community level ambulation without the walker; return to driving   OBJECTIVE:  Note: Objective measures were completed at evaluation unless otherwise noted.  LOWER EXTREMITY MMT:    MMT Right Eval Left Eval  Hip flexion 3+, no pain 3+ with some L hip and back pain with this  Knee flexion 3+ 4  Knee extension 4- 4  Ankle dorsiflexion 3+ 4  Ankle plantarflexion 4- 4  (Blank rows = not tested)  TREATMENT DATE: 09/10/2024    NMR: To facilitate reeducation of movement, balance, posture, coordination, and/or proprioception/kinesthetic sense.  Sidestepping on and off airex, intermittent UE support due to significant imbalance, encouraged to not rely heavily on UE assist due to tendency to rely on UE despite only minor balance disruptions. 2 x 10 reps with rest between sets due to fatigue.   Balance obstacle course of objects below randomized - cone weaving, airex step on and off, turns, and 4 in step ups and downs - 1 LOB stepping up the required min A to prevent LOB.  2 x 4 laps   TA- To improve functional movements patterns for everyday tasks   Step ups to step trainer with one riser 2 x 10 ea LE with rest between sets - min A on a few due to post LOB - heavy verbal cues for sequencing - very difficult exercise    PATIENT EDUCATION: Education details: Pt educated on progress note findings Person educated: Patient Education method: Explanation Education comprehension:  verbalized understanding   HOME EXERCISE PROGRAM: Access Code: [email or web address] URL: [email or web address] Date: 02/04/2024 Prepared by: Peggye Linear  Exercises - Standing March with Counter Support  - 2 x daily - 7 x weekly - 2 sets - 20 reps - Side stepping with counter support: yellow band loop  - 2 x daily - 7 x weekly - 2 sets - 20 reps - Sit to Stand with Counter Support  - 2 x daily - 7 x weekly - 2 sets - 12 reps   GOALS: Goals reviewed with patient? Yes  SHORT TERM GOALS: Target date: 03/10/2024  Pt will be independent with HEP in order to improve strength and balance in order to decrease fall risk and improve function at home and work.  Baseline: need to initiate7/16: 2-3x week Goal status:  MET  LONG TERM GOALS: Target date: 10/08/2024    1.  Patient will complete five times sit to stand test in < 15 seconds indicating an increased LE strength and improved balance. Baseline:  38.75 seconds, using UE support, 7/16: 20.35 sec, UE support on 1st STS only 8/20:16.86 sec no UE support 9/10:12.16 sec  Goal Status: MET   2.  Patient will increase ABC scale score >50% to demonstrate better functional mobility and better confidence with ADLs.   Baseline: 7.5%, 7/16: 18.125% 8/20: 18.75% 9/10: 16.25% 11/5: 45% 12/15: 65% 1/19:73.15% Goal status: MET   3.  Patient will increase Berg Balance score by > 6 points to demonstrate decreased fall risk during functional activities. Baseline: need to assess; 01/30/2024= 34/56, 7/16= 39 8/20:42 Goal status: MET   4. Patient will increase 10 meter walk test to >1.7m/s as to improve gait speed for better community ambulation and to reduce fall risk. Baseline: 0.48 m/s using youth RW, requires CGA for safety, 7/16: 0.69 m/s 8/20:  .76 m/s 9/10:.70 m/s 11/05: 11:42 seconds using FWW, .88 m/s 12/15: with Rollator.0.84 m/s and without AD 0.88 m/s Goal status: ONGOING  5. Patient will increase six minute walk test distance to >1000  for progression to community ambulator and improve gait ability Baseline: need to assess; 01/30/2024=410 feet using RW; 02/13/24: 453ft in 60m30s, 7/16: 660ft using 2WW 8/20: 900 ft with RW  9/29: 835 ft with 4WW  11/5: 994 ft with RW 12/12: 997 ft with Rollator 1/19: Goal status: MET  6.  Patient will improve dynamic gait index score by 4 points or greater to indicate improvement in gait with a rolling  walker as well as decreased risk for falls Baseline:9/17:10 11/5: 14/24 Goal status: MET  7.  Pt will report 70% or greater confidence when ambulating around her home and completing her usual daily activities with LRAD.  Baseline: 30-50%  11/5: 100%  Goal status: MET  8.  Pt will improve mini-best score by 4 points or more to decrease fall risk during functional activity.1 Baseline: 8; 12/15: 11/28 1/19:18 Goal status: MET    ASSESSMENT:  CLINICAL IMPRESSION:    The patient is a 69 year old female s/p CVA who continues to demonstrate significant balance deficits, impaired sequencing, and decreased motor control during functional mobility tasks. She required intermittent upper extremity support during sidestepping on and off the airex due to notable imbalance, along with frequent verbal cues to reduce overreliance on her arms despite only minor balance disruptions. During a multi-component balance obstacle course, she experienced one loss of balance when stepping up on 4 in step and required minimal assistance to prevent a fall. Step training remained highly challenging, with several posterior losses of balance and difficulty following sequencing cues.  Continued physical therapy is beneficial to improve her neuromuscular control, balance reactions, and functional stepping tolerance to reduce fall risk and promote safer mobility. Ongoing skilled intervention will also support improved activity tolerance and confidence with dynamic tasks needed for daily activities, ultimately progressing her  toward greater independence.   OBJECTIVE IMPAIRMENTS: Abnormal gait, decreased activity tolerance, decreased balance, decreased endurance, decreased knowledge of use of DME, decreased mobility, difficulty walking, decreased strength, decreased safety awareness, impaired vision/preception, and pain.   ACTIVITY LIMITATIONS: carrying, lifting, bending, standing, squatting, stairs, transfers, bed mobility, bathing, toileting, dressing, reach over head, hygiene/grooming, and locomotion level  PARTICIPATION LIMITATIONS: meal prep, cleaning, laundry, driving, shopping, and community activity  PERSONAL FACTORS: Age, Time since onset of injury/illness/exacerbation, and 3+ comorbidities: Hypertension, B12 deficiency, type 2 diabetes, hyperlipidemia, CKD stage IIIa are also affecting patient's functional outcome.   REHAB POTENTIAL: Good  CLINICAL DECISION MAKING: Evolving/moderate complexity  EVALUATION COMPLEXITY: Moderate  PLAN:  PT FREQUENCY: 1-2x/week  PT DURATION: 8 weeks  PLANNED INTERVENTIONS: 97164- PT Re-evaluation, 97750- Physical Performance Testing, 97110-Therapeutic exercises, 97530- Therapeutic activity, [id number]- Neuromuscular re-education, 97535- Self Care, [id number]- Manual therapy, [id number]- Gait training, [phone]- Orthotic Initial, [phone]- Orthotic/Prosthetic subsequent, [phone]- Canalith repositioning, [phone]- Electrical stimulation (manual), Patient/Family education, Balance training, Stair training, Taping, Joint mobilization, Spinal mobilization, Vestibular training, Visual/preceptual remediation/compensation, DME instructions, Cryotherapy, Moist heat, and Biofeedback  PLAN FOR NEXT SESSION:   Progress below activities:  Components of Mini-BESTest: -Gait speed, horizontal head turns -Stepping over obstacles -Single leg stance -Balance reactions Obstacle navigation Ambulation on uneven surfaces On and off airex variable direction without UE support ( balance recovery strategies)   Navigating small spaces  Lonni KATHEE Gainer PT ,DPT Physical Therapist- [facility]    "

## 2024-09-15 ENCOUNTER — Ambulatory Visit: Admitting: Physical Therapy

## 2024-09-15 ENCOUNTER — Encounter: Admitting: Occupational Therapy

## 2024-09-17 ENCOUNTER — Ambulatory Visit: Admitting: Physical Therapy

## 2024-09-17 ENCOUNTER — Encounter: Admitting: Occupational Therapy

## 2024-09-17 DIAGNOSIS — R269 Unspecified abnormalities of gait and mobility: Secondary | ICD-10-CM

## 2024-09-17 DIAGNOSIS — I69351 Hemiplegia and hemiparesis following cerebral infarction affecting right dominant side: Secondary | ICD-10-CM

## 2024-09-17 DIAGNOSIS — M6281 Muscle weakness (generalized): Secondary | ICD-10-CM

## 2024-09-17 NOTE — Therapy (Signed)
 " OUTPATIENT PHYSICAL THERAPY TREATMENT  Patient Name: Haley Jones MRN: [id number] DOB:06-18-1956, 69 y.o., female Today's Date: 09/17/2024   PCP: Dr. Oneil Pinal REFERRING PROVIDER: Toribio Pitch, PA-C  END OF SESSION:   PT End of Session - 09/17/24 1624     Visit Number 63    Number of Visits 70    Date for Recertification  10/08/24    Authorization Type [employer]    Progress Note Due on Visit 70    PT Start Time 1402    PT Stop Time 1444    PT Time Calculation (min) 42 min    Equipment Utilized During Treatment Gait belt    Activity Tolerance Patient tolerated treatment well    Behavior During Therapy WFL for tasks assessed/performed                  Past Medical History:  Diagnosis Date   Adnexal mass    Anxiety    Chronic back pain    Depression    Diabetes mellitus without complication (HCC)    Endometriosis 03/12/2018   GERD (gastroesophageal reflux disease)    Heart murmur 02/2018   undetected until she was an adult. 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   Weight loss 12/06/2017   Past Surgical History:  Procedure Laterality Date   ABDOMINAL HYSTERECTOMY  2008   ovaries also   CESAREAN SECTION  1994   COLONOSCOPY     COLONOSCOPY WITH PROPOFOL  N/A 02/12/2018   Procedure: COLONOSCOPY WITH PROPOFOL ;  Surgeon: Toledo, Ladell POUR, MD;  Location: [facility];  Service: Gastroenterology;  Laterality: N/A;   ESOPHAGOGASTRODUODENOSCOPY (EGD) WITH PROPOFOL  N/A 02/12/2018   Procedure: ESOPHAGOGASTRODUODENOSCOPY (EGD) WITH PROPOFOL ;  Surgeon: Toledo, Ladell POUR, MD;  Location: [facility];  Service: Gastroenterology;  Laterality: N/A;   EYE SURGERY Left 1973   muscle shortening to straighten cross eyes   LAPAROSCOPY N/A 02/20/2018   Procedure: LAPAROSCOPY DIAGNOSTIC, ( RESECTION OF RIGHT LOWER QUADRANT ABDOMINAL MASS);  Surgeon: Nicholaus Selinda Birmingham, MD;   Location: [facility];  Service: General;  Laterality: N/A;   ROTATOR CUFF REPAIR Left 2011   Patient Active Problem List   Diagnosis Date Noted   Cognitive communication deficit 04/07/2024   Aphasia 04/07/2024   Urinary incontinence as sequela of cerebrovascular accident (CVA) 02/11/2024   Depression with anxiety 01/04/2024   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   Acute CVA (cerebrovascular accident) (HCC) 12/20/2023   Primary hypertension 12/20/2023   Acute kidney injury superimposed on chronic kidney disease 12/20/2023   DM type 2 with diabetic mixed hyperlipidemia (HCC) 11/17/2022   Major depressive disorder, recurrent, 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   B12 deficiency 02/23/2021   SBO (small bowel obstruction) (HCC) 01/29/2019   Barrett's esophagus without dysplasia 04/15/2018   Weight loss 12/06/2017   Hoarse 12/06/2017   Lumbar disc disease 07/26/2017   Diabetes mellitus type 2, controlled, without complications (HCC) 02/09/2015   Surgical menopause 11/26/2014    ONSET DATE: 12/20/2023  REFERRING DIAG: P36.487 (ICD-10-CM) - Left middle cerebral artery stroke (HCC)   THERAPY DIAG:  Abnormality of gait  Hemiplegia and hemiparesis following cerebral infarction affecting right dominant side (HCC)  Muscle weakness (generalized)  Rationale for Evaluation and Treatment: Rehabilitation  SUBJECTIVE:  SUBJECTIVE STATEMENT:  Pt reports doing well today. Pt denies any recent falls/stumbles since prior session. Has been working on walking more at home without walker assistance.    PERTINENT HISTORY:Pt states she is not sure the exact date of when her CVA happened because she kept falling at home trying to care for herself and her daughter, but she finally went to the hospital when her sister insisted on it.  Pt states her special needs daughter, Haley Jones, passed away recently while pt was in the hospital at [facility] before she was transferred to [facility] for stroke rehabilitation.Pt's sister states pt requires supervision/cuing for ADLs  to don clothes correctly, with correct orientation (potential apraxia?) Pt/sister report pt has R side inattention.Pt reports supposedly one of her legs is shorter than the other, believes her R LE might be the shorter one. 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     PAIN:  Are you having pain? No  PRECAUTIONS: Fall RED FLAGS: None  WEIGHT BEARING RESTRICTIONS: No FALLS: Has patient fallen in last 6 months? Yes. Number of falls 6+ before going to hospital and 1 fall since being home from CIR - pt states she was trying to gather her clothes by herself rather than waiting for her sister to get there to help her  LIVING ENVIRONMENT Lives with: lives alone and but pt's sister, Haley Jones, comes over daily to assist with ADLs and stays to provide supervision all day - pt states at night she would be able to walk to bathroom using RW if needed (but she wears depends in event of incontinence) Lives in: House/apartment Stairs: she has steps, but she doesn't have to use them (house was wheelchair accessible for her daughter)  Has following equipment at home: Environmental [employer]ant - 2 wheeled, shower chair, Grab bars, Ramped entry, and transport chair  PLOF: Independent, Independent with household mobility without device, Independent with homemaking with ambulation, Independent with gait, Independent with transfers, and she was the primary caregiver for her recently deceased daughter   CLOF: Sister provides supervision daily for pt to ambulate using RW safely and provides assist for ADLs (showering and dressing - progressing towards supervision with these activities); Sister states pt requires cues to turn and step back fully prior to  sitting to ensure her safety  PATIENT GOALS: get back to being able to go shopping with improved community level ambulation without the walker; return to driving   OBJECTIVE:  Note: Objective measures were completed at evaluation unless otherwise noted.  LOWER EXTREMITY MMT:    MMT Right Eval Left Eval  Hip flexion 3+, no pain 3+ with some L hip and back pain with this  Knee flexion 3+ 4  Knee extension 4- 4  Ankle dorsiflexion 3+ 4  Ankle plantarflexion 4- 4  (Blank rows = not tested)  TREATMENT DATE: 09/17/2024   Write a clinical impression based on the information below. Keep it to one or two paragraphs and include why continued physical therapy is beneficial. Patient Description: 69 yo female s/p CVA  Interventions Performed:  NMR: To facilitate reeducation of movement, balance, posture, coordination, and/or proprioception/kinesthetic sense.  Sidestepping on and off airex, intermittent UE support due to significant imbalance, x 10 ea side  - same as above but stepping forward and retro on and off x 10 ea   Sidestepping on balance beam 2 x 5 laps for balance and for improving accuracy with object navigation   Airex step taps to step ant 2 x 10 ea LE   TA- To improve functional movements patterns for everyday tasks   Step ups to step trainer with one riser 2 x 10 ea LE a few minor post Lob corrected with stepping strategy, close CGA but allowed pt to make corrections as she was able and within safe clinic environment    PATIENT EDUCATION: Education details: Pt educated on progress note findings Person educated: Patient Education method: Explanation Education comprehension: verbalized understanding   HOME EXERCISE PROGRAM: Access Code: [email or web address] URL: [email or web address] Date: 02/04/2024 Prepared by: Peggye Linear  Exercises -  Standing March with Counter Support  - 2 x daily - 7 x weekly - 2 sets - 20 reps - Side stepping with counter support: yellow band loop  - 2 x daily - 7 x weekly - 2 sets - 20 reps - Sit to Stand with Counter Support  - 2 x daily - 7 x weekly - 2 sets - 12 reps   GOALS: Goals reviewed with patient? Yes  SHORT TERM GOALS: Target date: 03/10/2024  Pt will be independent with HEP in order to improve strength and balance in order to decrease fall risk and improve function at home and work.  Baseline: need to initiate7/16: 2-3x week Goal status:  MET  LONG TERM GOALS: Target date: 10/08/2024    1.  Patient will complete five times sit to stand test in < 15 seconds indicating an increased LE strength and improved balance. Baseline:  38.75 seconds, using UE support, 7/16: 20.35 sec, UE support on 1st STS only 8/20:16.86 sec no UE support 9/10:12.16 sec  Goal Status: MET   2.  Patient will increase ABC scale score >50% to demonstrate better functional mobility and better confidence with ADLs.   Baseline: 7.5%, 7/16: 18.125% 8/20: 18.75% 9/10: 16.25% 11/5: 45% 12/15: 65% 1/19:73.15% Goal status: MET   3.  Patient will increase Berg Balance score by > 6 points to demonstrate decreased fall risk during functional activities. Baseline: need to assess; 01/30/2024= 34/56, 7/16= 39 8/20:42 Goal status: MET   4. Patient will increase 10 meter walk test to >1.33m/s as to improve gait speed for better community ambulation and to reduce fall risk. Baseline: 0.48 m/s using youth RW, requires CGA for safety, 7/16: 0.69 m/s 8/20:  .76 m/s 9/10:.70 m/s 11/05: 11:42 seconds using FWW, .88 m/s 12/15: with Rollator.0.84 m/s and without AD 0.88 m/s Goal status: ONGOING  5. Patient will increase six minute walk test distance to >1000 for progression to community ambulator and improve gait ability Baseline: need to assess; 01/30/2024=410 feet using RW; 02/13/24: 466ft in 34m30s, 7/16: 675ft using 2WW 8/20: 900 ft  with RW  9/29: 835 ft with 4WW  11/5: 994 ft with RW 12/12: 997 ft with Rollator 1/19: Goal status: MET  6.  Patient will improve dynamic gait  index score by 4 points or greater to indicate improvement in gait with a rolling walker as well as decreased risk for falls Baseline:9/17:10 11/5: 14/24 Goal status: MET  7.  Pt will report 70% or greater confidence when ambulating around her home and completing her usual daily activities with LRAD.  Baseline: 30-50%  11/5: 100%  Goal status: MET  8.  Pt will improve mini-best score by 4 points or more to decrease fall risk during functional activity.1 Baseline: 8; 12/15: 11/28 1/19:18 Goal status: MET    ASSESSMENT:  CLINICAL IMPRESSION:    The patient is a 69 year old female s/p CVA who continues to present with significant balance impairments, decreased coordination, and difficulty with accurate foot placement during functional mobility tasks. She required intermittent upper extremity support during sidestepping and directional stepping on and off the Airex due to notable imbalance, though she was able to self correct at times when provided a safe environment. Training on the balance beam and Airex step taps further highlighted ongoing deficits in proprioception and controlled lower extremity recruitment. During step ups, the patient demonstrated minor posterior loss of balance that she corrected with an appropriate stepping strategy, indicating emerging but inconsistent balance reactions.  Continued physical therapy is beneficial to further improve neuromuscular control, reduce fall risk, and enhance her ability to navigate both predictable and unpredictable environmental challenges. Ongoing skilled intervention will help refine her postural responses, increase accuracy with limb placement, and improve safety and independence during daily activities that require dynamic balance.  OBJECTIVE IMPAIRMENTS: Abnormal gait, decreased activity  tolerance, decreased balance, decreased endurance, decreased knowledge of use of DME, decreased mobility, difficulty walking, decreased strength, decreased safety awareness, impaired vision/preception, and pain.   ACTIVITY LIMITATIONS: carrying, lifting, bending, standing, squatting, stairs, transfers, bed mobility, bathing, toileting, dressing, reach over head, hygiene/grooming, and locomotion level  PARTICIPATION LIMITATIONS: meal prep, cleaning, laundry, driving, shopping, and community activity  PERSONAL FACTORS: Age, Time since onset of injury/illness/exacerbation, and 3+ comorbidities: Hypertension, B12 deficiency, type 2 diabetes, hyperlipidemia, CKD stage IIIa are also affecting patient's functional outcome.   REHAB POTENTIAL: Good  CLINICAL DECISION MAKING: Evolving/moderate complexity  EVALUATION COMPLEXITY: Moderate  PLAN:  PT FREQUENCY: 1-2x/week  PT DURATION: 8 weeks  PLANNED INTERVENTIONS: 97164- PT Re-evaluation, 97750- Physical Performance Testing, 97110-Therapeutic exercises, 97530- Therapeutic activity, [id number]- Neuromuscular re-education, 97535- Self Care, [id number]- Manual therapy, [id number]- Gait training, [phone]- Orthotic Initial, [phone]- Orthotic/Prosthetic subsequent, [phone]- Canalith repositioning, [phone]- Electrical stimulation (manual), Patient/Family education, Balance training, Stair training, Taping, Joint mobilization, Spinal mobilization, Vestibular training, Visual/preceptual remediation/compensation, DME instructions, Cryotherapy, Moist heat, and Biofeedback  PLAN FOR NEXT SESSION:   Progress below activities:  Components of Mini-BESTest: -Gait speed, horizontal head turns -Stepping over obstacles -Single leg stance -Balance reactions Obstacle navigation Ambulation on uneven surfaces On and off airex variable direction without UE support ( balance recovery strategies)  Navigating small spaces  Lonni KATHEE Gainer PT ,DPT Physical Therapist- [place]   [facility]    "

## 2024-09-22 ENCOUNTER — Ambulatory Visit: Admitting: Physical Therapy

## 2024-09-22 ENCOUNTER — Encounter: Admitting: Occupational Therapy

## 2024-09-24 ENCOUNTER — Ambulatory Visit: Admitting: Physical Therapy

## 2024-09-24 ENCOUNTER — Encounter: Admitting: Occupational Therapy

## 2024-09-29 ENCOUNTER — Ambulatory Visit: Admitting: Physical Therapy

## 2024-09-29 ENCOUNTER — Encounter: Admitting: Occupational Therapy

## 2024-10-01 ENCOUNTER — Ambulatory Visit: Admitting: Physical Therapy

## 2024-10-01 ENCOUNTER — Encounter: Admitting: Occupational Therapy

## 2024-10-06 ENCOUNTER — Encounter: Admitting: Occupational Therapy

## 2024-10-06 ENCOUNTER — Ambulatory Visit: Admitting: Physical Therapy

## 2024-10-08 ENCOUNTER — Ambulatory Visit: Admitting: Physical Therapy

## 2024-10-08 ENCOUNTER — Encounter: Admitting: Occupational Therapy

## 2024-10-13 ENCOUNTER — Ambulatory Visit: Admitting: Physical Therapy

## 2024-10-13 ENCOUNTER — Encounter: Admitting: Occupational Therapy

## 2024-10-15 ENCOUNTER — Ambulatory Visit: Admitting: Physical Therapy

## 2024-10-15 ENCOUNTER — Encounter: Admitting: Occupational Therapy

## 2024-10-20 ENCOUNTER — Encounter: Admitting: Occupational Therapy

## 2024-10-20 ENCOUNTER — Ambulatory Visit: Admitting: Physical Therapy

## 2024-10-22 ENCOUNTER — Ambulatory Visit: Admitting: Physical Therapy

## 2024-10-22 ENCOUNTER — Encounter: Admitting: Occupational Therapy

## 2024-10-29 ENCOUNTER — Ambulatory Visit: Admitting: Physical Therapy

## 2024-11-03 ENCOUNTER — Ambulatory Visit

## 2024-11-05 ENCOUNTER — Ambulatory Visit: Admitting: Physical Therapy

## 2024-11-10 ENCOUNTER — Ambulatory Visit: Admitting: Physical Therapy
